# Patient Record
Sex: Female | Born: 1945 | Race: White | Hispanic: No | Smoking: Former smoker
Health system: Southern US, Community
[De-identification: ages and names within clinical notes are randomized; demographics above are authoritative.]

## PROBLEM LIST (undated history)

## (undated) DIAGNOSIS — E785 Hyperlipidemia, unspecified: Secondary | ICD-10-CM

## (undated) DIAGNOSIS — J45909 Unspecified asthma, uncomplicated: Secondary | ICD-10-CM

## (undated) DIAGNOSIS — F329 Major depressive disorder, single episode, unspecified: Secondary | ICD-10-CM

## (undated) DIAGNOSIS — I639 Cerebral infarction, unspecified: Secondary | ICD-10-CM

## (undated) DIAGNOSIS — I10 Essential (primary) hypertension: Secondary | ICD-10-CM

## (undated) DIAGNOSIS — K219 Gastro-esophageal reflux disease without esophagitis: Secondary | ICD-10-CM

## (undated) DIAGNOSIS — I509 Heart failure, unspecified: Secondary | ICD-10-CM

## (undated) DIAGNOSIS — F32A Depression, unspecified: Secondary | ICD-10-CM

## (undated) DIAGNOSIS — N189 Chronic kidney disease, unspecified: Secondary | ICD-10-CM

## (undated) DIAGNOSIS — E119 Type 2 diabetes mellitus without complications: Secondary | ICD-10-CM

## (undated) HISTORY — PX: TONSILLECTOMY: SUR1361

## (undated) HISTORY — DX: Major depressive disorder, single episode, unspecified: F32.9

## (undated) HISTORY — DX: Chronic kidney disease, unspecified: N18.9

## (undated) HISTORY — DX: Hyperlipidemia, unspecified: E78.5

## (undated) HISTORY — PX: TUBAL LIGATION: SHX77

## (undated) HISTORY — DX: Essential (primary) hypertension: I10

## (undated) HISTORY — DX: Unspecified asthma, uncomplicated: J45.909

## (undated) HISTORY — DX: Type 2 diabetes mellitus without complications: E11.9

## (undated) HISTORY — DX: Gastro-esophageal reflux disease without esophagitis: K21.9

## (undated) HISTORY — DX: Depression, unspecified: F32.A

## (undated) HISTORY — DX: Heart failure, unspecified: I50.9

## (undated) HISTORY — DX: Cerebral infarction, unspecified: I63.9

## (undated) MED FILL — Ferumoxytol Inj 510 MG/17ML (30 MG/ML) (Elemental Fe): INTRAVENOUS | Qty: 17 | Status: AC

---

## 1999-12-22 ENCOUNTER — Other Ambulatory Visit: Admission: RE | Admit: 1999-12-22 | Discharge: 1999-12-22 | Payer: Self-pay | Admitting: Family Medicine

## 2000-01-13 ENCOUNTER — Encounter (INDEPENDENT_AMBULATORY_CARE_PROVIDER_SITE_OTHER): Payer: Self-pay | Admitting: Specialist

## 2000-01-13 ENCOUNTER — Other Ambulatory Visit: Admission: RE | Admit: 2000-01-13 | Discharge: 2000-01-13 | Payer: Self-pay | Admitting: Gastroenterology

## 2000-04-05 HISTORY — PX: CHOLECYSTECTOMY: SHX55

## 2001-11-28 ENCOUNTER — Other Ambulatory Visit: Admission: RE | Admit: 2001-11-28 | Discharge: 2001-11-28 | Payer: Self-pay | Admitting: Family Medicine

## 2001-12-14 ENCOUNTER — Encounter: Admission: RE | Admit: 2001-12-14 | Discharge: 2001-12-14 | Payer: Self-pay | Admitting: Family Medicine

## 2001-12-14 ENCOUNTER — Encounter: Payer: Self-pay | Admitting: Family Medicine

## 2004-02-10 ENCOUNTER — Ambulatory Visit: Payer: Self-pay | Admitting: Family Medicine

## 2004-02-10 ENCOUNTER — Other Ambulatory Visit: Admission: RE | Admit: 2004-02-10 | Discharge: 2004-02-10 | Payer: Self-pay | Admitting: Family Medicine

## 2004-03-09 ENCOUNTER — Ambulatory Visit: Payer: Self-pay | Admitting: Oncology

## 2004-04-01 ENCOUNTER — Encounter: Admission: RE | Admit: 2004-04-01 | Discharge: 2004-04-01 | Payer: Self-pay | Admitting: Family Medicine

## 2004-06-29 ENCOUNTER — Ambulatory Visit: Payer: Self-pay | Admitting: Oncology

## 2004-10-19 ENCOUNTER — Ambulatory Visit: Payer: Self-pay | Admitting: Oncology

## 2004-11-12 ENCOUNTER — Ambulatory Visit: Payer: Self-pay | Admitting: Family Medicine

## 2005-03-17 ENCOUNTER — Ambulatory Visit: Payer: Self-pay | Admitting: Family Medicine

## 2005-03-17 ENCOUNTER — Other Ambulatory Visit: Admission: RE | Admit: 2005-03-17 | Discharge: 2005-03-17 | Payer: Self-pay | Admitting: Family Medicine

## 2005-03-17 ENCOUNTER — Encounter: Payer: Self-pay | Admitting: Family Medicine

## 2005-04-14 ENCOUNTER — Ambulatory Visit: Payer: Self-pay | Admitting: Oncology

## 2006-02-02 ENCOUNTER — Ambulatory Visit: Payer: Self-pay | Admitting: Oncology

## 2006-07-20 ENCOUNTER — Ambulatory Visit: Payer: Self-pay | Admitting: Oncology

## 2006-08-26 ENCOUNTER — Encounter: Payer: Self-pay | Admitting: Family Medicine

## 2006-11-08 ENCOUNTER — Ambulatory Visit: Payer: Self-pay | Admitting: Family Medicine

## 2006-11-08 DIAGNOSIS — F320 Major depressive disorder, single episode, mild: Secondary | ICD-10-CM | POA: Insufficient documentation

## 2006-11-08 DIAGNOSIS — F419 Anxiety disorder, unspecified: Secondary | ICD-10-CM | POA: Insufficient documentation

## 2006-11-08 DIAGNOSIS — E785 Hyperlipidemia, unspecified: Secondary | ICD-10-CM | POA: Insufficient documentation

## 2006-11-08 DIAGNOSIS — D696 Thrombocytopenia, unspecified: Secondary | ICD-10-CM | POA: Insufficient documentation

## 2006-11-17 LAB — CONVERTED CEMR LAB
ALT: 33 units/L (ref 0–35)
AST: 32 units/L (ref 0–37)
Albumin: 4.2 g/dL (ref 3.5–5.2)
Alkaline Phosphatase: 83 units/L (ref 39–117)
BUN: 16 mg/dL (ref 6–23)
Basophils Absolute: 0.1 10*3/uL (ref 0.0–0.1)
Basophils Relative: 0.7 % (ref 0.0–1.0)
Bilirubin, Direct: 0.1 mg/dL (ref 0.0–0.3)
CO2: 25 meq/L (ref 19–32)
Calcium: 9.8 mg/dL (ref 8.4–10.5)
Chloride: 107 meq/L (ref 96–112)
Cholesterol: 267 mg/dL (ref 0–200)
Creatinine, Ser: 1.1 mg/dL (ref 0.4–1.2)
Direct LDL: 171.1 mg/dL
Eosinophils Absolute: 0.1 10*3/uL (ref 0.0–0.6)
Eosinophils Relative: 2 % (ref 0.0–5.0)
GFR calc Af Amer: 65 mL/min
GFR calc non Af Amer: 54 mL/min
Glucose, Bld: 108 mg/dL — ABNORMAL HIGH (ref 70–99)
HCT: 41 % (ref 36.0–46.0)
HDL: 43.8 mg/dL (ref >39.0)
Hemoglobin: 14 g/dL (ref 12.0–15.0)
Lymphocytes Relative: 23.9 % (ref 12.0–46.0)
MCHC: 34.2 g/dL (ref 30.0–36.0)
MCV: 85.7 fL (ref 78.0–100.0)
Monocytes Absolute: 0.4 10*3/uL (ref 0.2–0.7)
Monocytes Relative: 6 % (ref 3.0–11.0)
Neutro Abs: 5 10*3/uL (ref 1.4–7.7)
Neutrophils Relative %: 67.4 % (ref 43.0–77.0)
Platelets: 576 10*3/uL — ABNORMAL HIGH (ref 150–400)
Potassium: 4.3 meq/L (ref 3.5–5.1)
RBC: 4.78 M/uL (ref 3.87–5.11)
RDW: 13 % (ref 11.5–14.6)
Sodium: 140 meq/L (ref 135–145)
TSH: 1.28 microintl units/mL (ref 0.35–5.50)
Total Bilirubin: 0.7 mg/dL (ref 0.3–1.2)
Total CHOL/HDL Ratio: 6.1
Total Protein: 7.5 g/dL (ref 6.0–8.3)
Triglycerides: 197 mg/dL — ABNORMAL HIGH (ref 0–149)
VLDL: 39 mg/dL (ref 0–40)
WBC: 7.4 10*3/uL (ref 4.5–10.5)

## 2006-12-06 ENCOUNTER — Ambulatory Visit: Payer: Self-pay | Admitting: Family Medicine

## 2007-08-08 ENCOUNTER — Telehealth (INDEPENDENT_AMBULATORY_CARE_PROVIDER_SITE_OTHER): Payer: Self-pay | Admitting: *Deleted

## 2007-10-16 ENCOUNTER — Other Ambulatory Visit: Admission: RE | Admit: 2007-10-16 | Discharge: 2007-10-16 | Payer: Self-pay | Admitting: Family Medicine

## 2007-10-16 ENCOUNTER — Ambulatory Visit: Payer: Self-pay | Admitting: Family Medicine

## 2007-10-16 ENCOUNTER — Encounter: Payer: Self-pay | Admitting: Family Medicine

## 2007-10-16 DIAGNOSIS — N951 Menopausal and female climacteric states: Secondary | ICD-10-CM | POA: Insufficient documentation

## 2007-10-17 ENCOUNTER — Encounter (INDEPENDENT_AMBULATORY_CARE_PROVIDER_SITE_OTHER): Payer: Self-pay | Admitting: *Deleted

## 2007-10-19 ENCOUNTER — Encounter (INDEPENDENT_AMBULATORY_CARE_PROVIDER_SITE_OTHER): Payer: Self-pay | Admitting: *Deleted

## 2007-10-27 ENCOUNTER — Telehealth (INDEPENDENT_AMBULATORY_CARE_PROVIDER_SITE_OTHER): Payer: Self-pay | Admitting: *Deleted

## 2007-10-27 ENCOUNTER — Encounter (INDEPENDENT_AMBULATORY_CARE_PROVIDER_SITE_OTHER): Payer: Self-pay | Admitting: *Deleted

## 2007-10-30 ENCOUNTER — Encounter (INDEPENDENT_AMBULATORY_CARE_PROVIDER_SITE_OTHER): Payer: Self-pay | Admitting: *Deleted

## 2007-11-01 ENCOUNTER — Ambulatory Visit: Payer: Self-pay | Admitting: Gastroenterology

## 2007-11-08 ENCOUNTER — Telehealth: Payer: Self-pay | Admitting: Gastroenterology

## 2007-11-15 ENCOUNTER — Ambulatory Visit: Payer: Self-pay | Admitting: Gastroenterology

## 2008-04-03 ENCOUNTER — Encounter: Payer: Self-pay | Admitting: Family Medicine

## 2008-04-08 ENCOUNTER — Telehealth: Payer: Self-pay | Admitting: Family Medicine

## 2008-04-08 DIAGNOSIS — R928 Other abnormal and inconclusive findings on diagnostic imaging of breast: Secondary | ICD-10-CM | POA: Insufficient documentation

## 2008-04-11 ENCOUNTER — Encounter: Payer: Self-pay | Admitting: Family Medicine

## 2008-12-02 ENCOUNTER — Ambulatory Visit: Payer: Self-pay | Admitting: Family Medicine

## 2008-12-02 DIAGNOSIS — I10 Essential (primary) hypertension: Secondary | ICD-10-CM | POA: Insufficient documentation

## 2008-12-09 LAB — CONVERTED CEMR LAB
ALT: 35 units/L (ref 0–35)
AST: 39 units/L — ABNORMAL HIGH (ref 0–37)
Albumin: 4.3 g/dL (ref 3.5–5.2)
Alkaline Phosphatase: 87 units/L (ref 39–117)
BUN: 14 mg/dL (ref 6–23)
Basophils Absolute: 0 10*3/uL (ref 0.0–0.1)
Basophils Relative: 0.2 % (ref 0.0–3.0)
Bilirubin, Direct: 0.1 mg/dL (ref 0.0–0.3)
CO2: 28 meq/L (ref 19–32)
Calcium: 9.5 mg/dL (ref 8.4–10.5)
Chloride: 104 meq/L (ref 96–112)
Cholesterol: 277 mg/dL — ABNORMAL HIGH (ref 0–200)
Creatinine, Ser: 1.2 mg/dL (ref 0.4–1.2)
Direct LDL: 168.4 mg/dL
Eosinophils Absolute: 0.1 10*3/uL (ref 0.0–0.7)
Eosinophils Relative: 1.3 % (ref 0.0–5.0)
GFR calc non Af Amer: 48.13 mL/min (ref >60)
Glucose, Bld: 98 mg/dL (ref 70–99)
HCT: 42.4 % (ref 36.0–46.0)
HDL: 43.5 mg/dL (ref >39.00)
Hemoglobin: 14.2 g/dL (ref 12.0–15.0)
Lymphocytes Relative: 23.5 % (ref 12.0–46.0)
Lymphs Abs: 1.7 10*3/uL (ref 0.7–4.0)
MCHC: 33.4 g/dL (ref 30.0–36.0)
MCV: 86.8 fL (ref 78.0–100.0)
Monocytes Absolute: 0.6 10*3/uL (ref 0.1–1.0)
Monocytes Relative: 9.1 % (ref 3.0–12.0)
Neutro Abs: 4.7 10*3/uL (ref 1.4–7.7)
Neutrophils Relative %: 65.9 % (ref 43.0–77.0)
Platelets: 497 10*3/uL — ABNORMAL HIGH (ref 150.0–400.0)
Potassium: 4.9 meq/L (ref 3.5–5.1)
RBC: 4.89 M/uL (ref 3.87–5.11)
RDW: 14.4 % (ref 11.5–14.6)
Sodium: 139 meq/L (ref 135–145)
TSH: 1.07 microintl units/mL (ref 0.35–5.50)
Total Bilirubin: 0.8 mg/dL (ref 0.3–1.2)
Total CHOL/HDL Ratio: 6
Total Protein: 7.5 g/dL (ref 6.0–8.3)
Triglycerides: 271 mg/dL — ABNORMAL HIGH (ref 0.0–149.0)
VLDL: 54.2 mg/dL — ABNORMAL HIGH (ref 0.0–40.0)
WBC: 7.1 10*3/uL (ref 4.5–10.5)

## 2008-12-12 ENCOUNTER — Telehealth (INDEPENDENT_AMBULATORY_CARE_PROVIDER_SITE_OTHER): Payer: Self-pay | Admitting: *Deleted

## 2008-12-12 ENCOUNTER — Encounter (INDEPENDENT_AMBULATORY_CARE_PROVIDER_SITE_OTHER): Payer: Self-pay | Admitting: *Deleted

## 2008-12-23 ENCOUNTER — Ambulatory Visit: Payer: Self-pay | Admitting: Family Medicine

## 2008-12-24 LAB — CONVERTED CEMR LAB
BUN: 26 mg/dL — ABNORMAL HIGH (ref 6–23)
CO2: 27 meq/L (ref 19–32)
Calcium: 9.2 mg/dL (ref 8.4–10.5)
Chloride: 104 meq/L (ref 96–112)
Creatinine, Ser: 1.3 mg/dL — ABNORMAL HIGH (ref 0.4–1.2)
GFR calc non Af Amer: 43.88 mL/min (ref >60)
Glucose, Bld: 110 mg/dL — ABNORMAL HIGH (ref 70–99)
Potassium: 4.7 meq/L (ref 3.5–5.1)
Sodium: 139 meq/L (ref 135–145)

## 2009-01-06 ENCOUNTER — Encounter: Payer: Self-pay | Admitting: Family Medicine

## 2009-01-06 ENCOUNTER — Ambulatory Visit: Payer: Self-pay | Admitting: Family Medicine

## 2009-01-06 ENCOUNTER — Other Ambulatory Visit: Admission: RE | Admit: 2009-01-06 | Discharge: 2009-01-06 | Payer: Self-pay | Admitting: Family Medicine

## 2009-01-06 DIAGNOSIS — G2581 Restless legs syndrome: Secondary | ICD-10-CM | POA: Insufficient documentation

## 2009-01-09 ENCOUNTER — Encounter: Payer: Self-pay | Admitting: Family Medicine

## 2009-03-03 ENCOUNTER — Ambulatory Visit: Payer: Self-pay | Admitting: Family Medicine

## 2009-03-04 ENCOUNTER — Telehealth: Payer: Self-pay | Admitting: Family Medicine

## 2009-03-04 LAB — CONVERTED CEMR LAB
ALT: 35 units/L (ref 0–35)
AST: 35 units/L (ref 0–37)
Albumin: 4.1 g/dL (ref 3.5–5.2)
Alkaline Phosphatase: 57 units/L (ref 39–117)
BUN: 15 mg/dL (ref 6–23)
Bilirubin, Direct: 0 mg/dL (ref 0.0–0.3)
CO2: 26 meq/L (ref 19–32)
Calcium: 9.3 mg/dL (ref 8.4–10.5)
Chloride: 106 meq/L (ref 96–112)
Cholesterol: 248 mg/dL — ABNORMAL HIGH (ref 0–200)
Creatinine, Ser: 1.2 mg/dL (ref 0.4–1.2)
Direct LDL: 145.6 mg/dL
GFR calc non Af Amer: 48.09 mL/min (ref >60)
Glucose, Bld: 98 mg/dL (ref 70–99)
HDL: 45.9 mg/dL (ref >39.00)
Hgb A1c MFr Bld: 6.2 % (ref 4.6–6.5)
Potassium: 4.3 meq/L (ref 3.5–5.1)
Sodium: 141 meq/L (ref 135–145)
Total Bilirubin: 0.7 mg/dL (ref 0.3–1.2)
Total CHOL/HDL Ratio: 5
Total Protein: 7.3 g/dL (ref 6.0–8.3)
Triglycerides: 281 mg/dL — ABNORMAL HIGH (ref 0.0–149.0)
VLDL: 56.2 mg/dL — ABNORMAL HIGH (ref 0.0–40.0)

## 2009-03-17 ENCOUNTER — Ambulatory Visit: Payer: Self-pay | Admitting: Family Medicine

## 2009-03-17 DIAGNOSIS — J019 Acute sinusitis, unspecified: Secondary | ICD-10-CM | POA: Insufficient documentation

## 2009-03-18 ENCOUNTER — Telehealth (INDEPENDENT_AMBULATORY_CARE_PROVIDER_SITE_OTHER): Payer: Self-pay | Admitting: *Deleted

## 2009-03-20 ENCOUNTER — Encounter: Payer: Self-pay | Admitting: Family Medicine

## 2009-06-06 ENCOUNTER — Encounter: Payer: Self-pay | Admitting: Family Medicine

## 2010-04-07 ENCOUNTER — Ambulatory Visit
Admission: RE | Admit: 2010-04-07 | Discharge: 2010-04-07 | Payer: Self-pay | Source: Home / Self Care | Attending: Family Medicine | Admitting: Family Medicine

## 2010-04-07 DIAGNOSIS — M25569 Pain in unspecified knee: Secondary | ICD-10-CM | POA: Insufficient documentation

## 2010-04-08 ENCOUNTER — Ambulatory Visit (HOSPITAL_BASED_OUTPATIENT_CLINIC_OR_DEPARTMENT_OTHER)
Admission: RE | Admit: 2010-04-08 | Discharge: 2010-04-08 | Payer: Self-pay | Source: Home / Self Care | Attending: Family Medicine | Admitting: Family Medicine

## 2010-04-23 ENCOUNTER — Encounter: Payer: Self-pay | Admitting: Family Medicine

## 2010-04-29 ENCOUNTER — Other Ambulatory Visit: Payer: Self-pay | Admitting: Family Medicine

## 2010-04-29 ENCOUNTER — Encounter: Payer: Self-pay | Admitting: Family Medicine

## 2010-04-29 ENCOUNTER — Other Ambulatory Visit (HOSPITAL_COMMUNITY)
Admission: RE | Admit: 2010-04-29 | Discharge: 2010-04-29 | Disposition: A | Discharge status: Home / Self Care | Payer: Medicare Other | Source: Ambulatory Visit | Attending: Family Medicine | Admitting: Family Medicine

## 2010-04-29 ENCOUNTER — Ambulatory Visit
Admission: RE | Admit: 2010-04-29 | Discharge: 2010-04-29 | Payer: Self-pay | Source: Home / Self Care | Attending: Family Medicine | Admitting: Family Medicine

## 2010-04-29 DIAGNOSIS — Z01419 Encounter for gynecological examination (general) (routine) without abnormal findings: Secondary | ICD-10-CM | POA: Insufficient documentation

## 2010-04-29 LAB — BASIC METABOLIC PANEL
BUN: 26 mg/dL — ABNORMAL HIGH (ref 6–23)
CO2: 25 mEq/L (ref 19–32)
Calcium: 9.3 mg/dL (ref 8.4–10.5)
Chloride: 106 mEq/L (ref 96–112)
Creatinine, Ser: 1.2 mg/dL (ref 0.4–1.2)
GFR: 50.33 mL/min — ABNORMAL LOW (ref >60.00)
Glucose, Bld: 107 mg/dL — ABNORMAL HIGH (ref 70–99)
Potassium: 4.8 mEq/L (ref 3.5–5.1)
Sodium: 139 mEq/L (ref 135–145)

## 2010-04-29 LAB — HEPATIC FUNCTION PANEL
ALT: 29 U/L (ref 0–35)
AST: 28 U/L (ref 0–37)
Albumin: 4 g/dL (ref 3.5–5.2)
Alkaline Phosphatase: 90 U/L (ref 39–117)
Bilirubin, Direct: 0.1 mg/dL (ref 0.0–0.3)
Total Bilirubin: 0.5 mg/dL (ref 0.3–1.2)
Total Protein: 7 g/dL (ref 6.0–8.3)

## 2010-04-29 LAB — LIPID PANEL
Cholesterol: 224 mg/dL — ABNORMAL HIGH (ref 0–200)
HDL: 41.4 mg/dL (ref >39.00)
Total CHOL/HDL Ratio: 5
Triglycerides: 254 mg/dL — ABNORMAL HIGH (ref 0.0–149.0)
VLDL: 50.8 mg/dL — ABNORMAL HIGH (ref 0.0–40.0)

## 2010-04-29 LAB — CBC WITH DIFFERENTIAL/PLATELET
Basophils Absolute: 0 10*3/uL (ref 0.0–0.1)
Basophils Relative: 0.6 % (ref 0.0–3.0)
Eosinophils Absolute: 0.1 10*3/uL (ref 0.0–0.7)
Eosinophils Relative: 2.2 % (ref 0.0–5.0)
HCT: 35.3 % — ABNORMAL LOW (ref 36.0–46.0)
Hemoglobin: 12 g/dL (ref 12.0–15.0)
Lymphocytes Relative: 22.4 % (ref 12.0–46.0)
Lymphs Abs: 1.3 10*3/uL (ref 0.7–4.0)
MCHC: 33.9 g/dL (ref 30.0–36.0)
MCV: 81.6 fl (ref 78.0–100.0)
Monocytes Absolute: 0.5 10*3/uL (ref 0.1–1.0)
Monocytes Relative: 8.8 % (ref 3.0–12.0)
Neutro Abs: 3.7 10*3/uL (ref 1.4–7.7)
Neutrophils Relative %: 66 % (ref 43.0–77.0)
Platelets: 313 10*3/uL (ref 150.0–400.0)
RBC: 4.32 Mil/uL (ref 3.87–5.11)
RDW: 19.2 % — ABNORMAL HIGH (ref 11.5–14.6)
WBC: 5.6 10*3/uL (ref 4.5–10.5)

## 2010-04-29 LAB — LDL CHOLESTEROL, DIRECT: Direct LDL: 142.2 mg/dL

## 2010-04-29 LAB — FECAL OCCULT BLOOD, GUAIAC: Fecal Occult Blood: NEGATIVE

## 2010-05-01 ENCOUNTER — Encounter (INDEPENDENT_AMBULATORY_CARE_PROVIDER_SITE_OTHER): Payer: Self-pay | Admitting: *Deleted

## 2010-05-01 LAB — CONVERTED CEMR LAB: Vit D, 25-Hydroxy: 18 ng/mL — ABNORMAL LOW (ref 30–89)

## 2010-05-03 LAB — CONVERTED CEMR LAB
ALT: 25 units/L (ref 0–35)
AST: 30 units/L (ref 0–37)
Albumin: 4.5 g/dL (ref 3.5–5.2)
Alkaline Phosphatase: 94 units/L (ref 39–117)
BUN: 24 mg/dL — ABNORMAL HIGH (ref 6–23)
Basophils Absolute: 0 10*3/uL (ref 0.0–0.1)
Basophils Relative: 0 % (ref 0.0–1.0)
Bilirubin Urine: NEGATIVE
Bilirubin, Direct: 0.1 mg/dL (ref 0.0–0.3)
Blood in Urine, dipstick: NEGATIVE
CO2: 25 meq/L (ref 19–32)
Calcium: 9.7 mg/dL (ref 8.4–10.5)
Chloride: 101 meq/L (ref 96–112)
Cholesterol: 254 mg/dL (ref 0–200)
Creatinine, Ser: 1.2 mg/dL (ref 0.4–1.2)
Direct LDL: 170.1 mg/dL
Eosinophils Absolute: 0.2 10*3/uL (ref 0.0–0.7)
Eosinophils Relative: 2.3 % (ref 0.0–5.0)
GFR calc Af Amer: 59 mL/min
GFR calc non Af Amer: 48 mL/min
Glucose, Bld: 83 mg/dL (ref 70–99)
Glucose, Urine, Semiquant: NEGATIVE
HCT: 42.8 % (ref 36.0–46.0)
HDL: 47 mg/dL (ref >39.0)
Hemoglobin: 14.7 g/dL (ref 12.0–15.0)
Ketones, urine, test strip: NEGATIVE
Lymphocytes Relative: 20.1 % (ref 12.0–46.0)
MCHC: 34.4 g/dL (ref 30.0–36.0)
MCV: 85.2 fL (ref 78.0–100.0)
Monocytes Absolute: 0.4 10*3/uL (ref 0.1–1.0)
Monocytes Relative: 4.7 % (ref 3.0–12.0)
Neutro Abs: 6.8 10*3/uL (ref 1.4–7.7)
Neutrophils Relative %: 72.9 % (ref 43.0–77.0)
Nitrite: NEGATIVE
Platelets: 548 10*3/uL — ABNORMAL HIGH (ref 150–400)
Potassium: 4.8 meq/L (ref 3.5–5.1)
Protein, U semiquant: NEGATIVE
RBC: 5.03 M/uL (ref 3.87–5.11)
RDW: 13.7 % (ref 11.5–14.6)
Sodium: 136 meq/L (ref 135–145)
Specific Gravity, Urine: 1.01
TSH: 0.92 microintl units/mL (ref 0.35–5.50)
Total Bilirubin: 0.9 mg/dL (ref 0.3–1.2)
Total CHOL/HDL Ratio: 5.4
Total Protein: 7.8 g/dL (ref 6.0–8.3)
Triglycerides: 177 mg/dL — ABNORMAL HIGH (ref 0–149)
Urobilinogen, UA: NEGATIVE
VLDL: 35 mg/dL (ref 0–40)
WBC Urine, dipstick: NEGATIVE
WBC: 9.2 10*3/uL (ref 4.5–10.5)
pH: 5

## 2010-05-04 ENCOUNTER — Encounter: Payer: Self-pay | Admitting: Family Medicine

## 2010-05-05 NOTE — Progress Notes (Signed)
  Phone Note Outgoing Call   Summary of Call: TG are elevated-----  Diet and exercise---  take fenofibrate  160  #30   2refills.  1 by mouth once daily .   recheck 3 months---272.4  lipid, hep  Follow-up for Phone Call        pt aware...Marland KitchenMarland KitchenFelecia Deloach CMA  December 12, 2008 4:59 PM     New/Updated Medications: FENOFIBRATE 160 MG TABS (FENOFIBRATE) 1 by mouth once daily Prescriptions: FENOFIBRATE 160 MG TABS (FENOFIBRATE) 1 by mouth once daily  #30 x 2   Entered by:   Jeremy Johann CMA   Authorized by:   Loreen Freud DO   Signed by:   Jeremy Johann CMA on 12/12/2008   Method used:   Faxed to ...       [facility] [address] (retail)       [address]       [place], [place]  [address]       Ph: [phone]       Fax: [phone]   RxID:   [id number]

## 2010-05-05 NOTE — Letter (Signed)
Summary: External Other/[facility]  External Other/[facility]   Imported By: Job Founds 10/31/2006 16:38:57  _____________________________________________________________________  External Attachment:    Type:   Image     Comment:   External Document

## 2010-05-05 NOTE — Letter (Signed)
Summary: Letter Regarding Restless Leg Syndrome & Iron Deficiency/Active   Letter Regarding Restless Leg Syndrome & Iron Deficiency/Active [facility]   Imported By: Lanelle Bal 06/19/2009 10:50:06  _____________________________________________________________________  External Attachment:    Type:   Image     Comment:   External Document

## 2010-05-05 NOTE — Letter (Signed)
Summary: Results Follow-up Letter  [place] [facility]  [address] [place], [place] [address]   Phone: [phone]  Fax: [phone]    10/19/2007       Erin [name] [address] [place], [place]  [address]  Dear [name],   The following are the results of your recent test(s):  Test     Result     Pap Smear    Normal_______  Not Normal_____       Comments: _________________________________________________________ Cholesterol LDL(Bad cholesterol):          Your goal is less than:         HDL (Good cholesterol):        Your goal is more than: _________________________________________________________ Other Tests:   _________________________________________________________  Please call for an appointment Or __Negative Pap Smear._______________________________________________________ _________________________________________________________ _________________________________________________________  Sincerely,  Ardyth Man [place] at [facility]

## 2010-05-05 NOTE — Letter (Signed)
Summary: Results Follow up Letter  [facility] [place]  [address] [place], [place] [address]   Phone: [phone]  Fax: [phone]    10/30/2007 MRN: [id number]  Erin [name] [address] [place], [place]  [address]  Dear [name],  The following are the results of your recent test(s):  Test         Result    Pap Smear:        Normal _____  Not Normal _____ Comments: ______________________________________________________ Cholesterol: LDL(Bad cholesterol):         Your goal is less than:         HDL (Good cholesterol):       Your goal is more than: Comments:  ______________________________________________________ Mammogram:        Normal _____  Not Normal _____ Comments:  ___________________________________________________________________ Hemoccult:        Normal _____  Not normal _______ Comments:    _____________________________________________________________________ Other Tests:  see lab results from 10/16/07  We routinely do not discuss normal results over the telephone.  If you desire a copy of the results, or you have any questions about this information we can discuss them at your next office visit.   Sincerely,

## 2010-05-05 NOTE — Letter (Signed)
Summary: Results Follow up Letter  [facility] [facility] [place]  [address] [place], [place] [address]   Phone: [phone]  Fax: [phone]    12/12/2008 MRN: [id number]  Erin [name] [address] [place], [place]  [address]      Dear [name],  The following are the results of your recent test(s):  Test         Result    Pap Smear:        Normal _____  Not Normal _____ Comments: ______________________________________________________ Cholesterol: LDL(Bad cholesterol):         Your goal is less than:         HDL (Good cholesterol):       Your goal is more than: Comments:  ______________________________________________________ Mammogram:        Normal _____  Not Normal _____ Comments:  ___________________________________________________________________ Hemoccult:        Normal _____  Not normal _______ Comments:    _____________________________________________________________________ Other Tests:Please see attached labs.     We routinely do not discuss normal results over the telephone.  If you desire a copy of the results, or you have any questions about this information we can discuss them at your next office visit.   Sincerely,   Jeremy Johann CMA

## 2010-05-05 NOTE — Letter (Signed)
Summary: Primary Care Consult Scheduled Letter  [facility] [place]  [address] [place], [place] [address]   Phone: [phone]  Fax: [phone]      10/17/2007 MRN: [id number]  Erin [name] [address] [place], [place]  [address]    Dear Erin [name],      We have scheduled an appointment for you.  At the recommendation of Dr.Lowne, we have scheduled you a colonoscopy with Dr. Arlyce Dice on August 12th at 9:00am check in at 8:00am. We also have scheduled you a nurse visit which is on July 29th at 11:00am. Their address is [facility] [address] [place]. The office phone number is [phone].  If this appointment day and time is not convenient for you, please feel free to call the office of the doctor you are being referred to at the number listed above and reschedule the appointment.     It is important for you to keep your scheduled appointments. We are here to make sure you are given good patient care. If you have questions or you have made changes to your appointment, please notify us at  [phone], ask for Tiffany.    Thank you,  Patient Care Coordinator [place] at [facility]

## 2010-05-05 NOTE — Assessment & Plan Note (Signed)
Summary: 1 month//tl   Vital Signs:  Patient Profile:   65 Years Old Female Weight:      215 pounds Pulse rate:   72 / minute BP sitting:   140 / 92  (left arm) Cuff size:   large  Vitals Entered By: Shonna Chock (December 06, 2006 10:44 AM)                 PCP:  Laury Axon  Chief Complaint:  1 MONTH F/U and Depressive symptoms.  History of Present Illness:  Depressive Symptoms      This is a 65 year old woman who presents with Depressive symptoms.  Pt here f/u anxiety/ depression.  Doing much better but feels like she needs just a little bit more.  The patient denies depressed mood, loss of interest/pleasure, significant weight loss, significant weight gain, insomnia, hypersomnia, psychomotor agitation, and psychomotor retardation.  The patient denies fatigue or loss of energy, feelings of worthlessness, diminished concentration, indecisiveness, thoughts of death, thoughts of suicide, suicidal intent, and suicidal plans.  The patient reports the following psychosocial stressors: major life changes.  The patient denies abnormally elevated mood, abnormally irritable mood, decreased need for sleep, increased talkativeness, distractibility, flight of ideas, increased goal-directed activity, and inflated self-esteem/ grandiosity.    Current Allergies: No known allergies       Physical Exam  General:     Well-developed,well-nourished,in no acute distress; alert,appropriate and cooperative throughout examination Psych:     Cognition and judgment appear intact. Alert and cooperative with normal attention span and concentration. No apparent delusions, illusions, hallucinations    Impression & Recommendations:  Problem # 1:  ANXIETY STATE NOS (ICD-300.00) f/u 6 months or sooner prn Her updated medication list for this problem includes:    Prozac 40 Mg Caps (Fluoxetine hcl) .Marland Kitchen... 1 by mouth once daily    Klonopin 0.5 Mg Tabs (Clonazepam) .Marland Kitchen... 1 by mouth two times a day as  needed Discussed medication use and relaxation techniques.   Complete Medication List: 1)  Fish Oil 1000 Mg Caps (Omega-3 fatty acids) .Marland Kitchen.. 1 by mouth qd 2)  Calcium 600  .Marland KitchenMarland Kitchen. 1 by mouth once daily 3)  Mvi  .Marland Kitchen.. 1 once daily 4)  Prozac 40 Mg Caps (Fluoxetine hcl) .Marland Kitchen.. 1 by mouth once daily 5)  Klonopin 0.5 Mg Tabs (Clonazepam) .Marland Kitchen.. 1 by mouth two times a day as needed     Prescriptions: PROZAC 40 MG  CAPS (FLUOXETINE HCL) 1 by mouth once daily  #30 x 5   Entered and Authorized by:   Loreen Freud DO   Signed by:   Loreen Freud DO on 12/06/2006   Method used:   Print then Give to Patient   RxID:   [id number]

## 2010-05-05 NOTE — Medication Information (Signed)
Summary: Prior Authorization & Approval for [facility]  Prior Authorization & Approval for [facility]   Imported By: Lanelle Bal 03/26/2009 09:21:44  _____________________________________________________________________  External Attachment:    Type:   Image     Comment:   External Document

## 2010-05-05 NOTE — Letter (Signed)
Summary: [facility]/CONSULT FOR ANTERIOR FLOOR OF MOUTH  [facility]/CONSULT FOR ANTERIOR FLOOR OF MOUTH   Imported By: Freddy Jaksch 11/14/2006 15:04:27  _____________________________________________________________________  External Attachment:    Type:   Image     Comment:   External Document

## 2010-05-05 NOTE — Miscellaneous (Signed)
Summary: Directives and Consents-RELEASE OF INFORMATION  Directives and Consents-RELEASE OF INFORMATION   Imported By: Vanessa [place] 02/22/2007 10:27:43  _____________________________________________________________________  External Attachment:    Type:   Image     Comment:   External Document

## 2010-05-05 NOTE — Letter (Signed)
Summary: Results Follow up Letter  [facility] [place]  [address] [place], [place] [address]   Phone: [phone]  Fax: [phone]    01/09/2009 MRN: [id number]  Erin [name] [address] [place], [place]  [address]  Dear Erin [name],  The following are the results of your recent test(s):  Test         Result    Pap Smear:        Normal _X____  Not Normal _____ Comments: ______________________________________________________ Cholesterol: LDL(Bad cholesterol):         Your goal is less than:         HDL (Good cholesterol):       Your goal is more than: Comments:  ______________________________________________________ Mammogram:        Normal _____  Not Normal _____ Comments:  ___________________________________________________________________ Hemoccult:        Normal _____  Not normal _______ Comments:    _____________________________________________________________________ Other Tests:    We routinely do not discuss normal results over the telephone.  If you desire a copy of the results, or you have any questions about this information we can discuss them at your next office visit.   Sincerely,   Army Fossa CMA  January 09, 2009 12:59 PM

## 2010-05-05 NOTE — Progress Notes (Signed)
Summary: left msg for pt to call in ref to labs       Additional Follow-up for Phone Call Additional follow up Details #2::    left msg for pt to call in ref to lab .Kandice Hams  October 27, 2007 4:52 PM  Follow-up by: Kandice Hams,  October 27, 2007 4:52 PM  Additional Follow-up for Phone Call Additional follow up Details #3:: Details for Additional Follow-up Action Taken: discussed with pt Additional Follow-up by: Shary Decamp,  October 30, 2007 4:48 PM  New/Updated Medications: ZOCOR 40 MG  TABS (SIMVASTATIN) 1 by mouth qhs   Prescriptions: ZOCOR 40 MG  TABS (SIMVASTATIN) 1 by mouth qhs  #30 x 2   Entered by:   Shary Decamp   Authorized by:   Nolon Rod. Paz MD   Signed by:   Shary Decamp on 10/30/2007   Method used:   Electronically sent to ...       [facility] [address]       [address]       [place], [place]  [address]       Ph: [phone]       Fax: [phone]   RxID:   [id number]

## 2010-05-05 NOTE — Miscellaneous (Signed)
Summary: GI Previsit  Clinical Lists Changes  Medications: Added new medication of DULCOLAX 5 MG  TBEC (BISACODYL) Day before procedure take 2 at 3pm and 2 at 8pm. - Signed Added new medication of METOCLOPRAMIDE HCL 10 MG  TABS (METOCLOPRAMIDE HCL) As per prep instructions. - Signed Added new medication of MIRALAX   POWD (POLYETHYLENE GLYCOL 3350) As per prep  instructions. - Signed Rx of DULCOLAX 5 MG  TBEC (BISACODYL) Day before procedure take 2 at 3pm and 2 at 8pm.;  #4 x 0;  Signed;  Entered by: Kyra Searles RN II;  Authorized by: Louis Meckel MD;  Method used: Electronic Rx of METOCLOPRAMIDE HCL 10 MG  TABS (METOCLOPRAMIDE HCL) As per prep instructions.;  #2 x 0;  Signed;  Entered by: Kyra Searles RN II;  Authorized by: Louis Meckel MD;  Method used: Electronic Rx of MIRALAX   POWD (POLYETHYLENE GLYCOL 3350) As per prep  instructions.;  #255gm x 0;  Signed;  Entered by: Kyra Searles RN II;  Authorized by: Louis Meckel MD;  Method used: Electronic Observations: Added new observation of NKA: T (11/01/2007 11:17)    Prescriptions: MIRALAX   POWD (POLYETHYLENE GLYCOL 3350) As per prep  instructions.  #255gm x 0   Entered by:   Kyra Searles RN II   Authorized by:   Louis Meckel MD   Signed by:   Kyra Searles RN II on 11/01/2007   Method used:   Electronically sent to ...       [facility]       [address]       [place], [place]  [address]       Ph: [phone]       Fax: [phone]   RxID:   [id number] METOCLOPRAMIDE HCL 10 MG  TABS (METOCLOPRAMIDE HCL) As per prep instructions.  #2 x 0   Entered by:   Kyra Searles RN II   Authorized by:   Louis Meckel MD   Signed by:   Kyra Searles RN II on 11/01/2007   Method used:   Electronically sent to ...       [facility]       [address]       [place], [place]  [address]       Ph: [phone]       Fax: [phone]   RxID:    [id number] DULCOLAX 5 MG  TBEC (BISACODYL) Day before procedure take 2 at 3pm and 2 at 8pm.  #4 x 0   Entered by:   Kyra Searles RN II   Authorized by:   Louis Meckel MD   Signed by:   Kyra Searles RN II on 11/01/2007   Method used:   Electronically sent to ...       [facility]       [address]       [place], [place]  [address]       Ph: [phone]       Fax: [phone]   RxID:   [id number]

## 2010-05-05 NOTE — Assessment & Plan Note (Signed)
Summary: COMPLETE PHYSICAL W/PAP,FASTING,NS FEE/RH....   Vital Signs:  Patient profile:   65 year old female Height:      65 inches Weight:      221.8 pounds Temp:     97.7 degrees F oral Pulse rate:   76 / minute Pulse rhythm:   regular BP sitting:   130 / 70  (left arm) Cuff size:   large  Vitals Entered By: Army Fossa CMA (January 06, 2009 8:58 AM) CC: CPX, pap. Discuss BP medications.    History of Present Illness: Erin [name] here for cpe , pap and to discuss labs and medication. Erin [name] with no complaints.    Preventive Screening-Counseling & Management  Alcohol-Tobacco     Alcohol drinks/day: 0     Smoking Status: never     Passive Smoke Exposure: no  Caffeine-Diet-Exercise     Caffeine use/day: 1     Does Patient Exercise: no     Exercise Counseling: to improve exercise regimen  Hep-HIV-STD-Contraception     Dental Visit-last 6 months no     Dental Care Counseling: to seek dental care; no dental care within six months     SBE monthly: no     SBE Education/Counseling: to perform regular SBE  Safety-Violence-Falls     Seat Belt Use: yes      Sexual History:  widow.    Current Medications (verified): 1)  Prozac 40 Mg Caps (Fluoxetine Hcl) .Marland Kitchen.. 1 By Mouth Once Daily 2)  Lisinopril-Hydrochlorothiazide 20-25 Mg Tabs (Lisinopril-Hydrochlorothiazide) .Marland Kitchen.. 1 By Mouth Once Daily 3)  Mirapex 0.125 Mg Tabs (Pramipexole Dihydrochloride) .Marland Kitchen.. 1 -2 By Mouth At Bedtime 4)  Fenofibrate 160 Mg Tabs (Fenofibrate) .Marland Kitchen.. 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 Family History of Asthma Family History of CAD Female 1st degree relative <60 Family History of Stroke F 1st degree relative <60 Family History Uterine cancer Leukemia stomach cancer  Social History: Reviewed history from 10/16/2007 and no changes required. 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 Eyes:  Denies blurring, discharge, double vision, eye irritation, eye pain, halos, itching, light sensitivity, red eye, vision loss-1 eye, and vision loss-both eyes; optho--q2y. ENT:  Denies decreased hearing, difficulty swallowing, ear discharge, earache, hoarseness, nasal congestion, nosebleeds, postnasal drainage, ringing in ears, sinus pressure, and sore throat. CV:  Denies bluish discoloration of lips or nails, chest pain or discomfort, difficulty breathing at night, difficulty breathing while lying down, fainting, fatigue, leg cramps with exertion, lightheadness, near fainting, palpitations,  shortness of breath with exertion, swelling of feet, swelling of hands, and weight gain. Resp:  Denies chest discomfort, chest pain with inspiration, cough, coughing up blood, excessive snoring, hypersomnolence,  morning headaches, pleuritic, shortness of breath, sputum productive, and wheezing. GI:  Denies abdominal pain, bloody stools, change in bowel habits, constipation, dark tarry stools, diarrhea, excessive appetite, gas, hemorrhoids, indigestion, loss of appetite, and nausea. GU:  Denies abnormal vaginal bleeding, decreased libido, discharge, dysuria, genital sores, hematuria, incontinence, nocturia, urinary frequency, and urinary hesitancy. MS:  Denies joint pain, joint redness, joint swelling, loss of strength, low back pain, mid back pain, muscle aches, muscle , cramps, muscle weakness, stiffness, and thoracic pain. Derm:  Denies changes in color of skin, changes in nail beds, dryness, excessive perspiration, flushing, hair loss, insect bite(s), itching, lesion(s), poor wound healing, and rash. Neuro:  Denies brief paralysis, difficulty with concentration, disturbances in coordination, falling down, headaches, inability to speak, memory loss, numbness, poor balance, seizures, sensation of room spinning, tingling, tremors, visual disturbances, and weakness. Psych:  Denies alternate hallucination ( auditory/visual), anxiety, depression, easily angered, easily tearful, irritability, mental problems, panic attacks, sense of great danger, suicidal thoughts/plans, thoughts of violence, unusual visions or sounds, and thoughts /plans of harming others. Endo:  Denies cold intolerance, excessive hunger, excessive thirst, excessive urination, heat intolerance, polyuria, and weight change. Heme:  Denies abnormal bruising, bleeding, enlarge lymph nodes, fevers, pallor, and skin discoloration.  Physical Exam  General:  Well-developed,well-nourished,in no acute distress; alert,appropriate and cooperative throughout examination Head:  Normocephalic and atraumatic without obvious abnormalities. No apparent alopecia or balding. Eyes:  vision grossly intact, pupils equal, pupils round, pupils reactive to light, and no  injection.   Ears:  External ear exam shows no significant lesions or deformities.  Otoscopic examination reveals clear canals, tympanic membranes are intact bilaterally without bulging, retraction, inflammation or discharge. Hearing is grossly normal bilaterally. Nose:  External nasal examination shows no deformity or inflammation. Nasal mucosa are pink and moist without lesions or exudates. Mouth:  Oral mucosa and oropharynx without lesions or exudates.  Teeth in good repair. Neck:  No deformities, masses, or tenderness noted.no carotid bruits.   Chest Wall:  No deformities, masses, or tenderness noted. Breasts:  No mass, nodules, thickening, tenderness, bulging, retraction, inflamation, nipple discharge or skin changes noted.   Lungs:  Normal respiratory effort, chest expands symmetrically. Lungs are clear to auscultation, no crackles or wheezes. Heart:  Normal rate and regular rhythm. S1 and S2 normal without gallop, murmur, click, rub or other extra sounds. Abdomen:  Bowel sounds positive,abdomen soft and non-tender without masses, organomegaly or hernias noted. Rectal:  No external abnormalities noted. Normal sphincter tone. No rectal masses or tenderness. Genitalia:  Pelvic Exam:        External: normal female genitalia without lesions or masses        Vagina: normal without lesions or masses        Cervix: normal without lesions or masses        Adnexa: normal bimanual exam without masses or fullness        Uterus: normal by palpation        Pap smear: performed Msk:  normal ROM, no joint tenderness, no joint swelling, no joint warmth, no redness over joints, no joint deformities, no joint instability, and no crepitation.   Pulses:  R and L carotid,radial,femoral,dorsalis pedis and posterior tibial pulses are full and equal bilaterally Extremities:  No clubbing, cyanosis, edema, or deformity noted with normal full  range of motion of all joints.   Neurologic:  No cranial nerve deficits  noted. Station and gait are normal. Plantar reflexes are down-going bilaterally. DTRs are symmetrical throughout. Sensory, motor and coordinative functions appear intact. Skin:  Intact without suspicious lesions or rashes Cervical Nodes:  No lymphadenopathy noted Axillary Nodes:  No palpable lymphadenopathy Psych:  Cognition and judgment appear intact. Alert and cooperative with normal attention span and concentration. No apparent delusions, illusions, hallucinations   Impression & Recommendations:  Problem # 1:  HYPERTENSION, ESSENTIAL NOS (ICD-401.9)  Her updated medication list for this problem includes:    Lisinopril-hydrochlorothiazide 20-25 Mg Tabs (Lisinopril-hydrochlorothiazide) .Marland Kitchen... 1 by mouth once daily  BP today: 130/70 Prior BP: 144/82 (12/23/2008)  Labs Reviewed: K+: 4.7 (12/23/2008) Creat: : 1.3 (12/23/2008)   Chol: 277 (12/02/2008)   HDL: 43.50 (12/02/2008)   LDL: DEL (10/16/2007)   TG: 271.0 (12/02/2008)  Orders: EKG w/ Interpretation (93000)  Problem # 2:  ROUTINE GYNECOLOGICAL EXAMINATION (ICD-V72.31)  pap done we need mammo and bmd results.  Orders: EKG w/ Interpretation (93000)  Problem # 3:  POSTMENOPAUSAL STATUS (ICD-627.2)  Problem # 4:  ANXIETY STATE NOS (ICD-300.00)  Her updated medication list for this problem includes:    Prozac 40 Mg Caps (Fluoxetine hcl) .Marland Kitchen... 1 by mouth once daily  Discussed medication use and relaxation techniques.   Problem # 5:  HYPERLIPIDEMIA (ICD-272.4)  Her updated medication list for this problem includes:    Fenofibrate 160 Mg Tabs (Fenofibrate) .Marland Kitchen... 1 by mouth once daily  Labs Reviewed: SGOT: 39 (12/02/2008)   SGPT: 35 (12/02/2008)   HDL:43.50 (12/02/2008), 47.0 (10/16/2007)  LDL:DEL (10/16/2007), DEL (11/08/2006)  Chol:277 (12/02/2008), 254 (10/16/2007)  Trig:271.0 (12/02/2008), 177 (10/16/2007)  Orders: EKG w/ Interpretation (93000)  Complete Medication List: 1)  Prozac 40 Mg Caps (Fluoxetine hcl) .Marland Kitchen.. 1  by mouth once daily 2)  Lisinopril-hydrochlorothiazide 20-25 Mg Tabs (Lisinopril-hydrochlorothiazide) .Marland Kitchen.. 1 by mouth once daily 3)  Mirapex 0.125 Mg Tabs (Pramipexole dihydrochloride) .Marland Kitchen.. 1 -2 by mouth at bedtime 4)  Fenofibrate 160 Mg Tabs (Fenofibrate) .Marland Kitchen.. 1 by mouth once daily  Other Orders: Admin 1st Vaccine (09811) Flu Vaccine 70yrs + (91478) Prescriptions: MIRAPEX 0.125 MG TABS (PRAMIPEXOLE DIHYDROCHLORIDE) 1 -2 by mouth at bedtime  #60 x 11   Entered and Authorized by:   Loreen Freud DO   Signed by:   Loreen Freud DO on 01/06/2009   Method used:   Electronically to        [facility] [address] (retail)       [phone]       [place], [place]  [address]       Ph: [phone]       Fax: [phone]   RxID:   [id number] PROZAC 40 MG CAPS (FLUOXETINE HCL) 1 by mouth once daily  #30 x 11   Entered and Authorized by:   Loreen Freud DO   Signed by:   Loreen Freud DO on 01/06/2009   Method used:   Electronically to        [facility] [address] (retail)       [id number]       [place], [place]  [address]       Ph: [phone]       Fax: [phone]   RxID:   [id number]  Flu Vaccine Consent Questions     Do you have  a history of severe allergic reactions to this vaccine? no    Any prior history of allergic reactions to egg and/or gelatin? no    Do you have a sensitivity to the preservative Thimersol? no    Do you have a past history of Guillan-Barre Syndrome? no    Do you currently have an acute febrile illness? no    Have you ever had a severe reaction to latex? no    Vaccine information given and explained to patient? yes    Are you currently pregnant? no    Lot Number:[id number]   Exp Date:10/02/2009   Site Given  Left Deltoid IM  Immunization History:  Tetanus/Td Immunization History:    Tetanus/Td:  given (03/17/2005)    Tetanus/Td:  td (03/17/2005) .lbflu    Prevention &  Chronic Care Immunizations   Influenza vaccine: Fluvax 3+  (01/06/2009)    Tetanus booster: 03/17/2005: given   Tetanus booster due: 03/18/2015    Pneumococcal vaccine: Not documented    H. zoster vaccine: 10/16/2007: Zostavax  Colorectal Screening   Hemoccult: Not documented    [facility]: [facility]    (11/15/2007)   Colonoscopy due: 11/2017  Other Screening   Pap smear: Not documented    Mammogram: Not documented    DXA bone density scan: Not documented   Smoking status: never  (01/06/2009)  Lipids   Total Cholesterol: 277  (12/02/2008)   LDL: DEL  (10/16/2007)   LDL Direct: 168.4  (12/02/2008)   HDL: 43.50  (12/02/2008)   Triglycerides: 271.0  (12/02/2008)    SGOT (AST): 39  (12/02/2008)   SGPT (ALT): 35  (12/02/2008)   Alkaline phosphatase: 87  (12/02/2008)   Total bilirubin: 0.8  (12/02/2008)  Hypertension   Last Blood Pressure: 130 / 70  (01/06/2009)   Serum creatinine: 1.3  (12/23/2008)   Serum potassium 4.7  (12/23/2008)  Self-Management Support :    Hypertension self-management support: Not documented    Lipid self-management support: Not documented     Immunization History:  Tetanus/Td Immunization History:    Tetanus/Td:  Td (03/17/2005)   TD Result Date:  03/17/2005 TD Result:  given TD Next Due:  10 yr

## 2010-05-05 NOTE — Progress Notes (Signed)
Summary: prior auth APPROVED [employer]  Phone Note Refill Request Message from:  Pharmacy on [address], [place], [place]  Refills Requested: Medication #1:  CRESTOR 5 MG TABS 1 by mouth every other day. prior Berkley Harvey [phone]  Initial call taken by: [facility] CMA,  March 18, 2009 9:56 AM  Follow-up for Phone Call        prior in process awaiting fax................Marland KitchenFelecia Deloach CMA  March 18, 2009 9:57 AM  prior auth faxed back awaiting response........Marland KitchenFelecia Deloach CMA  March 18, 2009 10:27 AM   Additional Follow-up for Phone Call Additional follow up Details #1::        PRIOR AUTH APPROVED 03-18-09 to 04-04-38 PHARMACY FAXED, APPROVAL LETTER SCAN TO CHART..............Marland KitchenFelecia Deloach CMA  March 21, 2009 8:37 AM

## 2010-05-05 NOTE — Assessment & Plan Note (Signed)
Summary: cpx   Vital Signs:  Patient Profile:   65 Years Old Female Height:     65 inches Weight:      208.25 pounds Temp:     97.9 degrees F oral Pulse rate:   72 / minute Resp:     16 per minute BP sitting:   140 / 90  (right arm)  Pt. in pain?   no  Vitals Entered By: Ardyth Man (October 16, 2007 1:33 PM)               Vision Screening: Left eye with correction: 20 / 20 Right eye with correction: 20 / 20 Both eyes with correction: 20 / 20        Vision Entered By: Ardyth Man (October 16, 2007 1:37 PM)   PCP:  Laury Axon  Chief Complaint:  CPX-Pap-Fasting Labs.  History of Present Illness: Pt here for CPE and pap and fasting labs.   Pt husband died of COPD in Aug 28, 2022. No complaints.    Current Allergies: No known allergies   Past Medical History:    Reviewed history from 11/08/2006 and no changes required:       Depression       Hyperlipidemia  Past Surgical History:    Reviewed history from 11/08/2006 and no changes required:       Cholecystectomy 2000-08-27       Tubal ligation       Tonsillectomy   Family History:    Reviewed history from 11/08/2006 and no changes required:       Family History of Asthma       Family History of CAD Female 1st degree relative <60       Family History of Stroke F 1st degree relative <60       Family History Uterine cancer       Leukemia       stomach cancer  Social History:    Reviewed history from 11/08/2006 and no changes required:       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[employer]       Never Smoked       Alcohol use-no       Drug use-no       Regular exercise-no       Widow/Widower   Risk Factors:  Passive smoke exposure:  no HIV high-risk behavior:  no Caffeine use:  2 drinks per day Seatbelt use:  100 %  Family History Risk Factors:    Family History of MI in females < 81 years old:  no    Family History of MI in males < 80 years old:  no  Colonoscopy History:     Date of Last Colonoscopy:   01/13/2000    Results:  colon polyps    Review of Systems      See HPI  General      Denies chills, fatigue, fever, loss of appetite, malaise, sleep disorder, sweats, weakness, and weight loss.      optho --due  Eyes      Denies blurring, discharge, double vision, eye irritation, eye pain, halos, itching, light sensitivity, red eye, vision loss-1 eye, and vision loss-both eyes.      optho--due  ENT      Denies decreased hearing, difficulty swallowing, ear discharge, earache, hoarseness, nasal congestion, nosebleeds, postnasal drainage, ringing in ears, sinus pressure, and sore throat.      dentist-- due  CV      Denies bluish discoloration of lips or  nails, chest pain or discomfort, difficulty breathing at night, difficulty breathing while lying down, fainting, fatigue, leg cramps with exertion, lightheadness, near fainting, palpitations, shortness of breath with exertion, swelling of feet, swelling of hands, and weight gain.  Resp      Denies chest discomfort, chest pain with inspiration, cough, coughing up blood, excessive snoring, hypersomnolence, morning headaches, pleuritic, shortness of breath, sputum productive, and wheezing.  GI      Denies abdominal pain, bloody stools, change in bowel habits, constipation, dark tarry stools, diarrhea, excessive appetite, gas, hemorrhoids, indigestion, loss of appetite, nausea, vomiting, vomiting blood, and yellowish skin color.  GU      Denies abnormal vaginal bleeding, decreased libido, discharge, dysuria, genital sores, hematuria, incontinence, nocturia, urinary frequency, and urinary hesitancy.  MS      Denies joint pain, joint redness, joint swelling, loss of strength, low back pain, mid back pain, muscle aches, muscle , cramps, muscle weakness, stiffness, and thoracic pain.  Derm      Denies changes in color of skin, changes in nail beds, dryness, excessive perspiration, flushing, hair loss, insect bite(s), itching, lesion(s), poor  wound healing, and rash.  Neuro      Denies brief paralysis, difficulty with concentration, disturbances in coordination, falling down, headaches, inability to speak, memory loss, numbness, poor balance, seizures, sensation of room spinning, tingling, tremors, visual disturbances, and weakness.  Psych      Denies alternate hallucination ( auditory/visual), anxiety, depression, easily angered, easily tearful, irritability, mental problems, panic attacks, sense of great danger, suicidal thoughts/plans, thoughts of violence, unusual visions or sounds, and thoughts /plans of harming others.  Endo      Denies cold intolerance, excessive hunger, excessive thirst, excessive urination, heat intolerance, polyuria, and weight change.  Heme      Denies abnormal bruising, bleeding, enlarge lymph nodes, fevers, pallor, and skin discoloration.  Allergy      Denies hives or rash, itching eyes, persistent infections, seasonal allergies, and sneezing.   Physical Exam  General:     Well-developed,well-nourished,in no acute distress; alert,appropriate and cooperative throughout examination Head:     Normocephalic and atraumatic without obvious abnormalities. No apparent alopecia or balding. Eyes:     vision grossly intact, pupils equal, pupils round, pupils reactive to light, and no injection.   Ears:     External ear exam shows no significant lesions or deformities.  Otoscopic examination reveals clear canals, tympanic membranes are intact bilaterally without bulging, retraction, inflammation or discharge. Hearing is grossly normal bilaterally. Nose:     External nasal examination shows no deformity or inflammation. Nasal mucosa are pink and moist without lesions or exudates. Mouth:     Oral mucosa and oropharynx without lesions or exudates.  Teeth in good repair. Neck:     No deformities, masses, or tenderness noted. Chest Wall:     No deformities, masses, or tenderness noted. Breasts:     No  mass, nodules, thickening, tenderness, bulging, retraction, inflamation, nipple discharge or skin changes noted.   Lungs:     Normal respiratory effort, chest expands symmetrically. Lungs are clear to auscultation, no crackles or wheezes. Heart:     normal rate, regular rhythm, and no murmur.   Abdomen:     Bowel sounds positive,abdomen soft and non-tender without masses, organomegaly or hernias noted. Rectal:     No external abnormalities noted. Normal sphincter tone. No rectal masses or tenderness. Genitalia:     Pelvic Exam:        External: normal  female genitalia without lesions or masses        Vagina: normal without lesions or masses        Cervix: normal without lesions or masses        Adnexa: normal bimanual exam without masses or fullness        Uterus: normal by palpation        Pap smear: performed Msk:     normal ROM, no joint tenderness, no joint swelling, no joint warmth, no redness over joints, no joint deformities, no joint instability, and no crepitation.   Pulses:     R posterior tibial normal, R dorsalis pedis normal, R carotid normal, L posterior tibial normal, L dorsalis pedis normal, and L carotid normal.   Extremities:     No clubbing, cyanosis, edema, or deformity noted with normal full range of motion of all joints.   Neurologic:     No cranial nerve deficits noted. Station and gait are normal. Plantar reflexes are down-going bilaterally. DTRs are symmetrical throughout. Sensory, motor and coordinative functions appear intact. Skin:     Intact without suspicious lesions or rashes Cervical Nodes:     No lymphadenopathy noted Axillary Nodes:     No palpable lymphadenopathy Psych:     Cognition and judgment appear intact. Alert and cooperative with normal attention span and concentration. No apparent delusions, illusions, hallucinations    Impression & Recommendations:  Problem # 1:  PREVENTIVE HEALTH CARE (ICD-V70.0) [facility] UTD Orders: Gastroenterology  Referral (GI)   Problem # 2:  POSTMENOPAUSAL STATUS (ICD-627.2)  Orders: EKG w/ Interpretation (93000) Venipuncture (96295) TLB-BMP (Basic Metabolic Panel-BMET) (80048-METABOL) TLB-CBC Platelet - w/Differential (85025-CBCD) TLB-Hepatic/Liver Function Pnl (80076-HEPATIC) TLB-TSH (Thyroid Stimulating Hormone) (84443-TSH) TLB-Lipid Panel (80061-LIPID)   Problem # 3:  ANXIETY STATE NOS (ICD-300.00)  The following medications were removed from the medication list:    Klonopin 0.5 Mg Tabs (Clonazepam) .Marland Kitchen... 1 by mouth two times a day as needed  Her updated medication list for this problem includes:    Prozac 40 Mg Caps (Fluoxetine hcl) .Marland Kitchen... 1 by mouth once daily  Orders: EKG w/ Interpretation (93000) Venipuncture (28413) TLB-BMP (Basic Metabolic Panel-BMET) (80048-METABOL) TLB-CBC Platelet - w/Differential (85025-CBCD) TLB-Hepatic/Liver Function Pnl (80076-HEPATIC) TLB-TSH (Thyroid Stimulating Hormone) (84443-TSH) TLB-Lipid Panel (80061-LIPID) Discussed medication use and relaxation techniques.   Problem # 4:  HYPERLIPIDEMIA (ICD-272.4)  Orders: EKG w/ Interpretation (93000) Venipuncture (24401) TLB-BMP (Basic Metabolic Panel-BMET) (80048-METABOL) TLB-CBC Platelet - w/Differential (85025-CBCD) TLB-Hepatic/Liver Function Pnl (80076-HEPATIC) TLB-TSH (Thyroid Stimulating Hormone) (84443-TSH) TLB-Lipid Panel (80061-LIPID)  Labs Reviewed: Chol: 267 (11/08/2006)   HDL: 43.8 (11/08/2006)   LDL: DEL (11/08/2006)   TG: 197 (11/08/2006) SGOT: 32 (11/08/2006)   SGPT: 33 (11/08/2006)   Problem # 5:  Gynecological examination-routine (ICD-V72.31)  Complete Medication List: 1)  Fish Oil 1000 Mg Caps (Omega-3 fatty acids) .Marland Kitchen.. 1 by mouth qd 2)  Calcium 600  .Marland KitchenMarland Kitchen. 1 by mouth once daily 3)  Mvi  .Marland Kitchen.. 1 once daily 4)  Prozac 40 Mg Caps (Fluoxetine hcl) .Marland Kitchen.. 1 by mouth once daily 5)  Screening Mammogram  .... V70.0 6)  Bone Density  .... Dx--- postmenopausal  Other Orders: Zoster  (Shingles) Vaccine Live [id number]) Admin 1st Vaccine (36644)    Prescriptions: PROZAC 40 MG  CAPS (FLUOXETINE HCL) 1 by mouth once daily  #30 x 11   Entered and Authorized by:   Loreen Freud DO   Signed by:   Loreen Freud DO on 10/16/2007   Method used:   Electronically  sent to ...       [facility]       [address]       [place], [place]  [address]       Ph: [phone]       Fax: [phone]   RxID:   [id number] BONE DENSITY DX--- postmenopausal  #1 x 0   Entered and Authorized by:   Loreen Freud DO   Signed by:   Loreen Freud DO on 10/16/2007   Method used:   Print then Give to Patient   RxID:   [id number] SCREENING MAMMOGRAM V70.0  #1 x 0   Entered and Authorized by:   Loreen Freud DO   Signed by:   Loreen Freud DO on 10/16/2007   Method used:   Print then Give to Patient   RxID:   [id number]  ]  EKG  Procedure date:  10/16/2007  Findings:      sinus rhythm  73 bpm   EKG  Procedure date:  10/16/2007  Findings:      sinus rhythm  73 bpm    Zostavax # 1    Vaccine Type: Zostavax    Site: right deltoid    Mfr: Merck    Dose: 0.5 ml    Route: IM    Given by: Ardyth Man    Exp. Date: 06/27/2008    Lot #: [id number]    VIS given: 01/15/05 given October 16, 2007.   Laboratory Results   Urine Tests   Date/Time Reported: October 16, 2007 3:25 PM   Routine Urinalysis   Color: yellow Appearance: Clear Glucose: negative   (Normal Range: Negative) Bilirubin: negative   (Normal Range: Negative) Ketone: negative   (Normal Range: Negative) Spec. Gravity: 1.010   (Normal Range: 1.003-1.035) Blood: negative   (Normal Range: Negative) pH: 5.0   (Normal Range: 5.0-8.0) Protein: negative   (Normal Range: Negative) Urobilinogen: negative   (Normal Range: 0-1) Nitrite: negative   (Normal Range: Negative) Leukocyte Esterace: negative   (Normal Range: Negative)    CommentsFloydene Flock CMA  October 16, 2007 3:25  PM

## 2010-05-05 NOTE — Assessment & Plan Note (Signed)
Summary: RENEW PROZAC & DISCUSS BLOOD PRESSURE,NS FEE/RH.....   Vital Signs:  Patient profile:   65 year old female Height:      65 inches Weight:      220 pounds BMI:     36.74 Temp:     98.5 degrees F oral Pulse rate:   66 / minute BP sitting:   150 / 100  (left arm)  Vitals Entered By: Jeremy Johann CMA (December 02, 2008 3:16 PM) CC: discuss bp, refills   History of Present Illness: Pt here c/o high bp --no cp, sob, or cp.  Pt also needs refills prozac.  She has been very depressed lately but is also not sleepin well at night because of restless legs.      Current Medications (verified): 1)  Prozac 40 Mg Caps (Fluoxetine Hcl) .Marland Kitchen.. 1 By Mouth Once Daily 2)  Zestoretic 10-12.5 Mg Tabs (Lisinopril-Hydrochlorothiazide) .Marland Kitchen.. 1 By Mouth Once Daily 3)  Mirapex 0.125 Mg Tabs (Pramipexole Dihydrochloride) .Marland Kitchen.. 1 By Mouth At Bedtime For 1 Week . If Needed Increase To 2 By Mouth At Bedtime For 1 Week and May Increase To 3 By Mouth At Bedtime As Needed  Allergies (verified): No Known Drug Allergies  Past History:  Past medical, surgical, family and social histories (including risk factors) reviewed, and no changes noted (except as noted below).  Past Medical History: Reviewed history from 11/08/2006 and no changes required. Depression Hyperlipidemia  Past Surgical History: Reviewed history from 11/08/2006 and no changes required. Cholecystectomy 2002 Tubal ligation Tonsillectomy  Family History: Reviewed history from 10/16/2007 and no changes required. Family History of Asthma Family History of CAD Female 1st degree relative <60 Family History of Stroke F 1st degree relative <60 Family History Uterine cancer Leukemia stomach cancer  Social History: Reviewed history from 10/16/2007 and no changes required. 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 Lungs are clear to auscultation, no crackles or wheezes. Heart:  normal rate and no murmur.   Extremities:  No clubbing, cyanosis, edema, or deformity noted with normal full range of motion of all joints.   Skin:  Intact without suspicious lesions or rashes Cervical Nodes:  No lymphadenopathy noted Psych:  Cognition and judgment appear intact. Alert and cooperative with normal attention span and concentration. No apparent delusions, illusions, hallucinations   Impression & Recommendations:  Problem # 1:  ANXIETY STATE NOS (ICD-300.00)  The following medications were removed from the medication list:    Prozac 40 Mg Caps (Fluoxetine hcl) .Marland Kitchen... 1 by mouth once daily Her updated medication list for this problem includes:    Prozac 40 Mg Caps (Fluoxetine hcl) .Marland Kitchen... 1 by mouth once daily  Orders: Venipuncture ([address]) TLB-Lipid Panel (80061-LIPID) TLB-BMP (Basic Metabolic Panel-BMET) (80048-METABOL) TLB-CBC Platelet - w/Differential (85025-CBCD) TLB-Hepatic/Liver Function Pnl (80076-HEPATIC) TLB-TSH (Thyroid Stimulating Hormone) (84443-TSH)  Discussed medication use and relaxation techniques.   Problem # 2:  HYPERLIPIDEMIA (ICD-272.4)  The following medications were removed from the medication list:    Zocor 40 Mg Tabs (Simvastatin) .Marland Kitchen... 1 by mouth qhs  Labs Reviewed: SGOT: 30 (10/16/2007)   SGPT: 25 (10/16/2007)   HDL:47.0 (10/16/2007), 43.8 (11/08/2006)  LDL:DEL (10/16/2007), DEL (11/08/2006)  Chol:254 (10/16/2007), 267 (11/08/2006)  Trig:177 (10/16/2007), 197 (11/08/2006)  Orders: Venipuncture ([address]) TLB-Lipid Panel (80061-LIPID)  TLB-BMP (Basic Metabolic Panel-BMET) (80048-METABOL) TLB-CBC Platelet - w/Differential (85025-CBCD) TLB-Hepatic/Liver Function Pnl (80076-HEPATIC)  TLB-TSH (Thyroid Stimulating Hormone) (84443-TSH)  Problem # 3:  HYPERTENSION, ESSENTIAL NOS (ICD-401.9)  Her updated medication list for this problem includes:    Zestoretic 10-12.5 Mg Tabs (Lisinopril-hydrochlorothiazide) .Marland Kitchen... 1 by mouth once daily  BP today: 150/100 Prior BP: 140/90 (10/16/2007)  Labs Reviewed: K+: 4.8 (10/16/2007) Creat: : 1.2 (10/16/2007)   Chol: 254 (10/16/2007)   HDL: 47.0 (10/16/2007)   LDL: DEL (10/16/2007)   TG: 177 (10/16/2007)  Complete Medication List: 1)  Prozac 40 Mg Caps (Fluoxetine hcl) .Marland Kitchen.. 1 by mouth once daily 2)  Zestoretic 10-12.5 Mg Tabs (Lisinopril-hydrochlorothiazide) .Marland Kitchen.. 1 by mouth once daily 3)  Mirapex 0.125 Mg Tabs (Pramipexole dihydrochloride) .Marland Kitchen.. 1 by mouth at bedtime for 1 week . if needed increase to 2 by mouth at bedtime for 1 week and may increase to 3 by mouth at bedtime as needed  Patient Instructions: 1)  rto 2-3 weeks Prescriptions: MIRAPEX 0.125 MG TABS (PRAMIPEXOLE DIHYDROCHLORIDE) 1 by mouth at bedtime for 1 week . If needed increase to 2 by mouth at bedtime for 1 week and may increase to 3 by mouth at bedtime as needed  #60 x 0   Entered and Authorized by:   Loreen Freud DO   Signed by:   Loreen Freud DO on 12/02/2008   Method used:   Print then Give to Patient   RxID:   [id number] ZESTORETIC 10-12.5 MG TABS (LISINOPRIL-HYDROCHLOROTHIAZIDE) 1 by mouth once daily  #30 x 2   Entered and Authorized by:   Loreen Freud DO   Signed by:   Loreen Freud DO on 12/02/2008   Method used:   Electronically to        [facility] (retail)       [address]       [place], [place]  [address]       Ph: [phone]       Fax: [phone]   RxID:   [id number] PROZAC 40 MG CAPS (FLUOXETINE HCL) 1 by mouth once daily  #30 x 2   Entered and Authorized by:   Loreen Freud DO   Signed by:   Loreen Freud DO on 12/02/2008   Method used:   Electronically to        [facility]  (retail)       [address]       [place], [place]  [address]       Ph: [phone]       Fax: [phone]   RxID:   [id number]

## 2010-05-05 NOTE — Letter (Signed)
Summary: Results Follow up Letter  [facility] [place]  [address]   [place], [place] [address]   Phone: [phone]  Fax: [phone]    12/12/2008 MRN: [id number]  Yelena Bucher [address] [place],   [address]      Dear Ms. Dolloff,  The following are the results of your recent test(s):  Test         Result    Pap Smear:        Normal _____  Not Normal _____ Comments: ______________________________________________________ Cholesterol: LDL(Bad cholesterol):         Your goal is less than:         HDL (Good cholesterol):       Your goal is more than: Comments:  ______________________________________________________ Mammogram:        Normal _____  Not Normal _____ Comments:  ___________________________________________________________________ Hemoccult:        Normal _____  Not normal _______ Comments:    _____________________________________________________________________ Other Tests:Please see attached labs.     We routinely do not discuss normal results over the telephone.  If you desire a copy of the results, or you have any questions about this information we can discuss them at your next office visit.   Sincerely,   Felecia Deloach CMA 

## 2010-05-05 NOTE — Assessment & Plan Note (Signed)
Summary: 3 wks ov//ph   Vital Signs:  Patient profile:   65 year old female Weight:      220.50 pounds Temp:     98.4 degrees F oral Pulse rate:   74 / minute Pulse rhythm:   regular BP sitting:   144 / 82  (left arm) Cuff size:   large  Vitals Entered By: Army Fossa CMA (December 23, 2008 10:06 AM) CC: 3 wk ROV. Feeling weak and having some dizziness.    History of Present Illness:  Hypertension follow-up      This is a 65 year old woman who presents for Hypertension follow-up.  The patient complains of lightheadedness, but denies urinary frequency, headaches, edema, impotence, rash, and fatigue.  The patient denies the following associated symptoms: chest pain, chest pressure, exercise intolerance, dyspnea, palpitations, syncope, leg edema, and pedal edema.  Compliance with medications (by patient report) has been near 100%.  The patient reports that dietary compliance has been good.  The patient reports exercising occasionally.  Adjunctive measures currently used by the patient include salt restriction.  Pt states meds were making her feel sick and lightheaded but if she takes them after she eats she feels fine.    Current Medications (verified): 1)  Prozac 40 Mg Caps (Fluoxetine Hcl) .Marland Kitchen.. 1 By Mouth Once Daily 2)  Lisinopril-Hydrochlorothiazide 20-25 Mg Tabs (Lisinopril-Hydrochlorothiazide) .Marland Kitchen.. 1 By Mouth Once Daily 3)  Mirapex 0.125 Mg Tabs (Pramipexole Dihydrochloride) .Marland Kitchen.. 1 By Mouth At Bedtime For 1 Week . If Needed Increase To 2 By Mouth At Bedtime For 1 Week and May Increase To 3 By Mouth At Bedtime As Needed 4)  Fenofibrate 160 Mg Tabs (Fenofibrate) .Marland Kitchen.. 1 By Mouth Once Daily  Allergies (verified): No Known Drug Allergies  Past History:  Past medical, surgical, family and social histories (including risk factors) reviewed for relevance to current acute and chronic problems.  Past Medical History: Reviewed history from 11/08/2006 and no changes  required. Depression Hyperlipidemia  Past Surgical History: Reviewed history from 11/08/2006 and no changes required. Cholecystectomy 2002 Tubal ligation Tonsillectomy  Family History: Reviewed history from 10/16/2007 and no changes required. Family History of Asthma Family History of CAD Female 1st degree relative <60 Family History of Stroke F 1st degree relative <60 Family History Uterine cancer Leukemia stomach cancer  Social History: Reviewed history from 10/16/2007 and no changes required. 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 Lungs are clear to auscultation, no crackles or wheezes. Heart:  normal rate and no murmur.   Abdomen:  Bowel sounds positive,abdomen soft and non-tender without masses, organomegaly or hernias noted. Extremities:  No clubbing, cyanosis, edema, or deformity noted with normal full range of motion of all joints.   Skin:  Intact without suspicious lesions or rashes Psych:  Oriented X3 and normally interactive.     Impression & Recommendations:  Problem # 1:  HYPERTENSION, ESSENTIAL NOS (ICD-401.9)  Her updated medication list for this problem includes:    Lisinopril-hydrochlorothiazide 20-25 Mg Tabs (Lisinopril-hydrochlorothiazide) .Marland Kitchen... 1 by mouth once daily  Orders: Venipuncture ([address]) TLB-BMP (Basic Metabolic Panel-BMET) (80048-METABOL)  BP today: 144/82 Prior BP: 150/100 (12/02/2008)  Labs Reviewed: K+: 4.9 (12/02/2008) Creat: : 1.2 (12/02/2008)   Chol: 277 (12/02/2008)   HDL: 43.50 (12/02/2008)  LDL: DEL (10/16/2007)   TG: 271.0 (12/02/2008)  Complete Medication List: 1)  Prozac 40 Mg Caps (Fluoxetine hcl) .Marland Kitchen.. 1 by  mouth once daily 2)  Lisinopril-hydrochlorothiazide 20-25 Mg Tabs (Lisinopril-hydrochlorothiazide) .Marland Kitchen.. 1 by mouth once daily 3)  Mirapex 0.125 Mg Tabs (Pramipexole dihydrochloride) .Marland Kitchen.. 1 by mouth at bedtime for 1 week . if needed increase to 2 by mouth at bedtime for 1 week and may increase to 3 by mouth at bedtime as needed 4)  Fenofibrate 160 Mg Tabs (Fenofibrate) .Marland Kitchen.. 1 by mouth once daily Prescriptions: LISINOPRIL-HYDROCHLOROTHIAZIDE 20-25 MG TABS (LISINOPRIL-HYDROCHLOROTHIAZIDE) 1 by mouth once daily  #30 x 2   Entered and Authorized by:   Loreen Freud DO   Signed by:   Loreen Freud DO on 12/23/2008   Method used:   Electronically to        [facility] [address] (retail)       [phone]       [place], [place]  [address]       Ph: [phone]       Fax: [phone]   RxID:   [id number]

## 2010-05-05 NOTE — Progress Notes (Signed)
Summary: mammogram order  Phone Note From Other Clinic   Caller: debb Summary of Call: debb at [facility]ology fax over a report call back number [phone] pt needs order for diagnosis mammogram of the right breast and possible a ultrasound Initial call taken by: Jeremy Johann CMA,  April 08, 2008 4:44 PM  Follow-up for Phone Call        diagnostic mammo R breast --ordered Follow-up by: Loreen Freud DO,  April 08, 2008 4:54 PM  Additional Follow-up for Phone Call Additional follow up Details #1::        MMG scheduled for 04/11/08 @ 840 -- needs to arrive 30 minutes early no answer @ home number patient not @ work number ....Marland KitchenMarland KitchenShary Decamp  April 09, 2008 11:20 AM no answer.Marland KitchenMarland KitchenMarland KitchenShary Decamp  April 09, 2008 2:31 PM    New Problems: MAMMOGRAM, ABNORMAL, RIGHT (ICD-793.80)   Additional Follow-up for Phone Call Additional follow up Details #2::    please send letter Follow-up by: Loreen Freud DO,  April 09, 2008 1:00 PM  Additional Follow-up for Phone Call Additional follow up Details #3:: Details for Additional Follow-up Action Taken: Faxed. Ardyth Man  April 10, 2008 11:37 AM  Additional Follow-up by: Ardyth Man,  April 10, 2008 11:37 AM  New Problems: MAMMOGRAM, ABNORMAL, RIGHT (ICD-793.80)   Appended Document: mammogram order patient aware of appt  Appended Document: mammogram order we need results from [facility] received pt also thinks she had BMD there---we need those results as well  Appended Document: mammogram order will be faxed over.

## 2010-05-05 NOTE — Assessment & Plan Note (Signed)
Summary: FU ON LABS/[facility]   Vital Signs:  Patient profile:   65 year old female Weight:      226 pounds Temp:     97.8 degrees F oral Pulse rate:   82 / minute Pulse rhythm:   regular BP sitting:   142 / 84  (left arm) Cuff size:   large  Vitals Entered By: Army Fossa CMA (March 17, 2009 10:46 AM) CC: follow up on labs- pt has not started crestor- waiting on prior auth. , URI symptoms   History of Present Illness:       This is a 65 year old woman who presents with URI symptoms.  The symptoms began 4 weeks ago.  The patient complains of nasal congestion, purulent nasal discharge, and productive cough, but denies sore throat.  The patient denies fever, low-grade fever (<100.5 degrees), fever of 100.5-103 degrees, fever of 103.1-104 degrees, fever to >104 degrees, stiff neck, dyspnea, wheezing, rash, vomiting, diarrhea, use of an antipyretic, and response to antipyretic.  The patient also reports headache.  The patient denies itchy watery eyes, itchy throat, sneezing, seasonal symptoms, response to antihistamine, muscle aches, and severe fatigue.  The patient denies the following risk factors for Strep sinusitis: unilateral facial pain, unilateral nasal discharge, poor response to decongestant, double sickening, tooth pain, Strep exposure, tender adenopathy, and absence of cough.  Pt used nyquil with little relief.   Hyperlipidemia follow-up      The patient also presents for Hyperlipidemia follow-up.  The patient denies muscle aches, GI upset, abdominal pain, flushing, itching, constipation, diarrhea, and fatigue.  The patient denies the following symptoms: chest pain/pressure, exercise intolerance, dypsnea, palpitations, syncope, and pedal edema.  Compliance with medications (by patient report) has been near 100%.  Dietary compliance has been poor.  The patient reports no exercise.    Hypertension follow-up      The patient also presents for Hypertension follow-up.  The patient  complains of headaches, but denies lightheadedness, urinary frequency, edema, impotence, rash, and fatigue.  The patient denies the following associated symptoms: chest pain, chest pressure, exercise intolerance, dyspnea, palpitations, syncope, leg edema, and pedal edema.  Compliance with medications (by patient report) has been near 100%.  The patient reports that dietary compliance has been poor.  The patient reports no exercise.  Adjunctive measures currently used by the patient include salt restriction.    Current Medications (verified): 1)  Prozac 40 Mg Caps (Fluoxetine Hcl) .Marland Kitchen.. 1 By Mouth Once Daily 2)  Lisinopril-Hydrochlorothiazide 20-25 Mg Tabs (Lisinopril-Hydrochlorothiazide) .Marland Kitchen.. 1 By Mouth Once Daily 3)  Mirapex 0.125 Mg Tabs (Pramipexole Dihydrochloride) .Marland Kitchen.. 1 -2 By Mouth At Bedtime 4)  Fenofibrate 160 Mg Tabs (Fenofibrate) .Marland Kitchen.. 1 By Mouth Once Daily 5)  Crestor 5 Mg Tabs (Rosuvastatin Calcium) .Marland Kitchen.. 1 By Mouth Every Other Day.  Allergies (verified): No Known Drug Allergies  Past History:  Past medical, surgical, family and social histories (including risk factors) reviewed for relevance to current acute and chronic problems.  Past Medical History: Reviewed history from 11/08/2006 and no changes required. Depression Hyperlipidemia  Past Surgical History: Reviewed history from 11/08/2006 and no changes required. Cholecystectomy 2002 Tubal ligation Tonsillectomy  Family History: Reviewed history from 10/16/2007 and no changes required. Family History of Asthma Family History of CAD Female 1st degree relative <60 Family History of Stroke F 1st degree relative <60 Family History Uterine cancer Leukemia stomach cancer  Social History: Reviewed history from 10/16/2007 and no changes required. 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  Otoscopic examination reveals clear canals, tympanic membranes are intact bilaterally without bulging, retraction, inflammation or discharge. Hearing is grossly normal bilaterally. Nose:  L frontal sinus tenderness, L maxillary sinus tenderness, R frontal sinus tenderness, and R maxillary sinus tenderness.   Mouth:  Oral mucosa and oropharynx without lesions or exudates.  Teeth in good repair. Neck:  No deformities, masses, or tenderness noted. Lungs:  Normal respiratory effort, chest expands symmetrically. Lungs are clear to auscultation, no crackles or wheezes. Heart:  normal rate and no murmur.   Extremities:  No clubbing, cyanosis, edema, or deformity noted with normal full range of motion of all joints.   Skin:  Intact without suspicious lesions or rashes Cervical Nodes:  No lymphadenopathy noted Psych:  Oriented X3 and normally interactive.     Impression & Recommendations:  Problem # 1:  SINUSITIS - ACUTE-NOS (ICD-461.9)  Her updated medication list for this problem includes:    Augmentin 875-125 Mg Tabs (Amoxicillin-pot clavulanate) .Marland Kitchen... 1 by mouth two times a day    Nasacort Aq 55 Mcg/act Aers (Triamcinolone acetonide(nasal)) .Marland Kitchen... 2 sprays each nostril once daily  Instructed on treatment. Call if symptoms persist or worsen.   Problem # 2:  HYPERTENSION, ESSENTIAL NOS (ICD-401.9)  Her updated medication list for this problem includes:    Lisinopril-hydrochlorothiazide 20-25 Mg Tabs (Lisinopril-hydrochlorothiazide) .Marland Kitchen... 1 by mouth once daily  BP today: 142/84 Prior BP: 130/70 (01/06/2009)  Labs Reviewed: K+: 4.3 (03/03/2009) Creat: : 1.2 (03/03/2009)   Chol: 248 (03/03/2009)   HDL: 45.90 (03/03/2009)   LDL: DEL (10/16/2007)   TG: 281.0 (03/03/2009)  Problem # 3:  HYPERLIPIDEMIA  (ICD-272.4)  Her updated medication list for this problem includes:    Fenofibrate 160 Mg Tabs (Fenofibrate) .Marland Kitchen... 1 by mouth once daily    Crestor 5 Mg Tabs (Rosuvastatin calcium) .Marland Kitchen... 1 by mouth every other day.  Labs Reviewed: SGOT: 35 (03/03/2009)   SGPT: 35 (03/03/2009)   HDL:45.90 (03/03/2009), 43.50 (12/02/2008)  LDL:DEL (10/16/2007), DEL (11/08/2006)  Chol:248 (03/03/2009), 277 (12/02/2008)  Trig:281.0 (03/03/2009), 271.0 (12/02/2008)  Problem # 4:  DEPRESSION (ICD-311)  Her updated medication list for this problem includes:    Prozac 40 Mg Caps (Fluoxetine hcl) .Marland Kitchen... 1 by mouth once daily  Complete Medication List: 1)  Prozac 40 Mg Caps (Fluoxetine hcl) .Marland Kitchen.. 1 by mouth once daily 2)  Lisinopril-hydrochlorothiazide 20-25 Mg Tabs (Lisinopril-hydrochlorothiazide) .Marland Kitchen.. 1 by mouth once daily 3)  Mirapex 0.125 Mg Tabs (Pramipexole dihydrochloride) .Marland Kitchen.. 1 -2 by mouth at bedtime 4)  Fenofibrate 160 Mg Tabs (Fenofibrate) .Marland Kitchen.. 1 by mouth once daily 5)  Crestor 5 Mg Tabs (Rosuvastatin calcium) .Marland Kitchen.. 1 by mouth every other day. 6)  Augmentin 875-125 Mg Tabs (Amoxicillin-pot clavulanate) .Marland Kitchen.. 1 by mouth two times a day 7)  Nasacort Aq 55 Mcg/act Aers (Triamcinolone acetonide(nasal)) .... 2 sprays each nostril once daily Prescriptions: AUGMENTIN 875-125 MG TABS (AMOXICILLIN-POT CLAVULANATE) 1 by mouth two times a day  #20 x 0   Entered and Authorized by:   Loreen Freud DO   Signed by:   Loreen Freud DO on 03/17/2009   Method used:   Electronically to        [facility] (retail)  [address]       [place], [place]  [address]       Ph: [phone]       Fax: [phone]   RxID:   [id number] FENOFIBRATE 160 MG TABS (FENOFIBRATE) 1 by mouth once daily  #30 x 2   Entered and Authorized by:   Loreen Freud DO   Signed by:   Loreen Freud DO on 03/17/2009   Method used:   Electronically to        [facility] (retail)       [address]       [place], [place]  [address]       Ph: [phone]       Fax: [phone]   RxID:   [id number] LISINOPRIL-HYDROCHLOROTHIAZIDE 20-25 MG TABS (LISINOPRIL-HYDROCHLOROTHIAZIDE) 1 by mouth once daily  #30 x 2   Entered and Authorized by:   Loreen Freud DO   Signed by:   Loreen Freud DO on 03/17/2009   Method used:   Electronically to        [facility] (retail)       [address]       [place], [place]  [address]       Ph: [phone]       Fax: [phone]   RxID:   [id number] PROZAC 40 MG CAPS (FLUOXETINE HCL) 1 by mouth once daily  #30 x 11   Entered and Authorized by:   Loreen Freud DO   Signed by:   Loreen Freud DO on 03/17/2009   Method used:   Electronically to        [facility] (retail)       [address]       [place], [place]  [address]       Ph: [phone]       Fax: [phone]   RxID:   [id number]

## 2010-05-05 NOTE — Progress Notes (Signed)
Summary: dr Lorrine Kin  Phone Note Call from Patient Call back at Work Phone [phone] Message from:  Patient  Refills Requested: Medication #1:  PROZAC 40 MG  CAPS 1 by mouth once daily  rec by phone request rx on prozac generic 40 mg call into [facility] [place] [phone]. Pt has appt on 10-16-07  Initial call taken by: fdeloach,sma Caller: Patient Summary of Call: Rx called into pharmacy and patient aware Ardyth Man  Aug 08, 2007 9:29 AM       Prescriptions: PROZAC 40 MG  CAPS (FLUOXETINE HCL) 1 by mouth once daily  #30 x 1   Entered by:   Ardyth Man   Authorized by:   Loreen Freud DO   Signed by:   Ardyth Man on 08/08/2007   Method used:   Telephoned to ...         RxID:   [id number]

## 2010-05-05 NOTE — Assessment & Plan Note (Signed)
Summary: Erin [name]//tl   Vital Signs:  Patient Profile:   65 Years Old Female Weight:      215.6 pounds Pulse rate:   72 / minute BP sitting:   128 / 90  (left arm) Cuff size:   large  Vitals Entered By: Shonna Chock (November 08, 2006 10:56 AM)               PCP:  Laury Axon  Chief Complaint:  ANXIETY: NO CHEST PAIN and PATIENT TRIED LORAZEPAM (DAUGHTERS MED) AND IT HELPED.  History of Present Illness: Pt here with c/o increased stress.  Pt had bx under tongue from dentist and sent to ENT-- shoemaker.  No cancer, but ? precancerous.  Need records from them.  Pt also c/o increased stress at home and work.  Pt tried her daughter's med-- klonopin- with relief.   No chest pain. Symptoms for last month.  Pt took zoloft until 12/07.  She gained weight on it and really doesn't want that again.    Current Allergies: No known allergies   Past Medical History:    Depression    Hyperlipidemia  Past Surgical History:    Cholecystectomy 2002    Tubal ligation    Tonsillectomy   Family History:    Family History of Asthma    Family History of CAD Female 1st degree relative <60    Family History of Stroke F 1st degree relative <60  Social History:    Location manager    Married    Never Smoked    Alcohol use-no    Drug use-no    Regular exercise-no   Risk Factors:  Tobacco use:  never Drug use:  no Alcohol use:  no Exercise:  no   Review of Systems      See HPI  CV      Denies bluish discoloration of lips or nails, chest pain or discomfort, difficulty breathing at night, difficulty breathing while lying down, fainting, fatigue, leg cramps with exertion, lightheadness, near fainting, palpitations, shortness of breath with exertion, swelling of feet, swelling of hands, and weight gain.  Resp      Denies chest discomfort, chest pain with inspiration, cough, coughing up blood, excessive snoring, hypersomnolence, morning headaches, pleuritic, shortness of breath, sputum  productive, and wheezing.  Psych      Complains of anxiety, depression, easily tearful, and panic attacks.      Denies alternate hallucination ( auditory/visual), easily angered, irritability, mental problems, sense of great danger, suicidal thoughts/plans, thoughts of violence, unusual visions or sounds, and thoughts /plans of harming others.   Physical Exam  General:     Well-developed,well-nourished,in no acute distress; alert,appropriate and cooperative throughout examination Neck:     No deformities, masses, or tenderness noted. Psych:     Oriented X3, memory intact for recent and remote, normally interactive, good eye contact, not anxious appearing, and tearful.      Impression & Recommendations:  Problem # 1:  ANXIETY STATE NOS (ICD-300.00)  Her updated medication list for this problem includes:    Prozac 20 Mg Caps (Fluoxetine hcl) .Marland Kitchen... 1 by mouth once daily    Klonopin 0.5 Mg Tabs (Clonazepam) .Marland Kitchen... 1 by mouth two times a day as needed Discussed medication use and relaxation techniques.  d/w pt counseling-- pt does not want itat this time Orders: Venipuncture (29562) TLB-Lipid Panel (80061-LIPID) TLB-BMP (Basic Metabolic Panel-BMET) (80048-METABOL) TLB-CBC Platelet - w/Differential (85025-CBCD) TLB-Hepatic/Liver Function Pnl (80076-HEPATIC) TLB-TSH (Thyroid Stimulating Hormone) (84443-TSH)   Problem #  2:  DEPRESSION (ICD-311)  Her updated medication list for this problem includes:    Prozac 20 Mg Caps (Fluoxetine hcl) .Marland Kitchen... 1 by mouth once daily    Klonopin 0.5 Mg Tabs (Clonazepam) .Marland Kitchen... 1 by mouth two times a day as needed Discussed treatment options, including trial of antidpressant medication. Will refer to behavioral health. Follow-up call in in 24-48 hours and recheck in 2 weeks, sooner as needed. Patient agrees to call if any worsening of symptoms or thoughts of doing harm arise. Verified that the patient has no suicidal ideation at this time.   Complete  Medication List: 1)  Fish Oil 1000 Mg Caps (Omega-3 fatty acids) .Marland Kitchen.. 1 by mouth qd 2)  Calcium 600  .Marland KitchenMarland Kitchen. 1 by mouth once daily 3)  Mvi  .Marland Kitchen.. 1 once daily 4)  Prozac 20 Mg Caps (Fluoxetine hcl) .Marland Kitchen.. 1 by mouth once daily 5)  Klonopin 0.5 Mg Tabs (Clonazepam) .Marland Kitchen.. 1 by mouth two times a day as needed     Prescriptions: KLONOPIN 0.5 MG  TABS (CLONAZEPAM) 1 by mouth two times a day as needed  #60 x 0   Entered and Authorized by:   Loreen Freud DO   Signed by:   Loreen Freud DO on 11/08/2006   Method used:   Print then Give to Patient   RxID:   [id number] PROZAC 20 MG  CAPS (FLUOXETINE HCL) 1 by mouth once daily  #30 x 2   Entered and Authorized by:   Loreen Freud DO   Signed by:   Loreen Freud DO on 11/08/2006   Method used:   Print then Give to Patient   RxID:   [id number]

## 2010-05-05 NOTE — Progress Notes (Signed)
Summary: Lab results/meds   Phone Note Outgoing Call   Call placed by: Army Fossa CMA,  March 04, 2009 3:28 PM Summary of Call: Spoke with pt and she states that Zocor made her sick and that is why she stopped taking this medciation. What would you like to start her on?  Initial call taken by: Army Fossa CMA,  March 04, 2009 3:28 PM  Follow-up for Phone Call        crestor 5 mg  #30  1 by mouth every other day ---can try some samples first Follow-up by: Loreen Freud DO,  March 04, 2009 4:12 PM  Additional Follow-up for Phone Call Additional follow up Details #1::        pt aware, samples upfront.  Additional Follow-up by: Army Fossa CMA,  March 04, 2009 4:14 PM    New/Updated Medications: CRESTOR 5 MG TABS (ROSUVASTATIN CALCIUM) 1 by mouth every other day.

## 2010-05-05 NOTE — Progress Notes (Signed)
Summary: SICK-CAN SHE STILL DO COLON?  Phone Note Call from Patient Call back at Home Phone [phone]   Call For: DR [facility] Reason for Call: Talk to Nurse Summary of Call: WENT TO HER PRIMARY THIS AM WHO FOUND SINUS INFECTION & BROCHITIS. IS ON ANTIBIOTICS. CAN SHE STILL DO PROCEDURE ON 11-15-07? Initial call taken by: Leanor Kail [facility],  November 08, 2007 1:29 PM  Follow-up for Phone Call        Pt. started antibiotocs for a sinus infection and bronchitis. She is scheduled for colonoscopy on 11-15-07. As long as she is feeling better and doesn't have a fever she can have colon done. Pt. instructed to take antibiotics as directed. Pt. instructed to call back as needed.  Follow-up by: Laureen Ochs LPN,  November 08, 2007 1:46 PM

## 2010-05-05 NOTE — Procedures (Signed)
Summary: Colonoscopy   Colonoscopy  Procedure date:  11/15/2007  Findings:      Location:  [facility].    Procedures Next Due Date:    Colonoscopy: 11/2017  Patient Name: Erin [name], Erin [name] MRN: [id number] Procedure Procedures: Colonoscopy CPT: 04540.  Personnel: Endoscopist: Barbette Hair. Arlyce Dice, MD.  Exam Location: Outpatient  Patient Consent: Procedure, Alternatives, Risks and Benefits discussed, consent obtained, from patient.  Indications  Average Risk Screening Routine.  History  Current Medications: Patient is not currently taking Coumadin.  Pre-Exam Physical: Performed Nov 15, 2007. Cardio-pulmonary exam, HEENT exam , Abdominal exam, Mental status exam WNL.  Comments: Patient history reviewed/updated, physical performed prior to initiation of sedation?yes Exam Exam: Extent of exam reached: Cecum, extent intended: Cecum.  The cecum was identified by IC valve. Time to Cecum: 00:04:45. Time for Withdrawl: 00:04:37. Colon retroflexion performed. ASA Classification: II. Tolerance: good.  Monitoring: Pulse and BP monitoring, Oximetry used. Supplemental O2 given. at 2 Liters.  Colon Prep Used Miralax for colon prep. Prep results: excellent.  Sedation Meds: Patient assessed and found to be appropriate for moderate (conscious) sedation. Sedation was managed by the Endoscopist. Fentanyl 75 mcg. given IV. Versed 8 mg. given IV.  Findings - NORMAL EXAM: Cecum to Descending Colon.  NORMAL EXAM: Cecum.  - NORMAL EXAM: Sigmoid Colon to Rectum.  - DIVERTICULOSIS: Sigmoid Colon. ICD9: Diverticulosis: 562.10. Comments: Scattered diverticula.   Assessment Abnormal examination, see findings above.  Diagnoses: 562.10: Diverticulosis.   Events  Unplanned Interventions: No intervention was required.  Unplanned Events: There were no complications. Plans  Post Exam Instructions: Post sedation instructions given.  Patient Education: Patient given standard  instructions for: Diverticulosis.  Disposition: After procedure patient sent to recovery. After recovery patient sent home.  Scheduling/Referral: Colonoscopy, to Barbette Hair. Arlyce Dice, MD, around Nov 14, 2017.     cc.   Myrene Buddy Lowne,DO   This report was created from the original endoscopy report, which was reviewed and signed by the above listed endoscopist.

## 2010-05-05 NOTE — Letter (Signed)
Summary: Results Follow up Letter  [facility] [place]  [address] [place], [place] [address]   Phone: [phone]  Fax: [phone]    10/27/2007 MRN: [id number]  Erin [name] [address] [place], [place]  [address]  Dear [name],  The following are the results of your recent test(s):  Test         Result    Pap Smear:        Normal _____  Not Normal _____ Comments: ______________________________________________________ Cholesterol: LDL(Bad cholesterol):         Your goal is less than:         HDL (Good cholesterol):       Your goal is more than: Comments:  ______________________________________________________ Mammogram:        Normal _____  Not Normal _____ Comments:  ___________________________________________________________________ Hemoccult:        Normal _____  Not normal _______ Comments:    _____________________________________________________________________ Other Tests:    We routinely do not discuss normal results over the telephone.  If you desire a copy of the results, or you have any questions about this information we can discuss them at your next office visit.   Sincerely,

## 2010-05-07 NOTE — Letter (Signed)
Summary: [facility]: Immunoassay Fecal Occult Blood (iFOB) Order Form  [facility] [place]  [address] [place], [place] [address]   Phone: [phone]  Fax: [phone]      [facility]: Immunoassay Fecal Occult Blood (iFOB) Order Form   May 01, 2010 MRN: [id number]   Erin [name] 01/15/1946   Physicican Name:______dr lowne___________________  Diagnosis Code:_______V76.51___________________      Jeremy Johann CMA

## 2010-05-07 NOTE — Assessment & Plan Note (Signed)
Summary: LEG PROBLEM/KN   Vital Signs:  Patient profile:   65 year old female Height:      65 inches Weight:      237.0 pounds BMI:     39.58 O2 Sat:      100 % on Room air Temp:     97.8 degrees F oral Pulse rate:   95 / minute Pulse rhythm:   regular BP sitting:   150 / 90  (right arm) Cuff size:   large  Vitals Entered By: Almeta Monas CMA Duncan Dull) (April 07, 2010 10:32 AM)  O2 Flow:  Room air CC: xfew months c/o pain to the right calf and knee worst after working all day--- also c/o URI and cough with green mucus   History of Present Illness: Pt here c/o R knee pain--x few months.  No known injury.   Pt took advil with little relief.   Pt also ran out of meds and did not call for refill..  She states she did not have insurance and could not afford meds.  Current Medications (verified): 1)  Lisinopril-Hydrochlorothiazide 20-25 Mg Tabs (Lisinopril-Hydrochlorothiazide) .Marland Kitchen.. 1 By Mouth Once Daily 2)  Mirapex 0.125 Mg Tabs (Pramipexole Dihydrochloride) .Marland Kitchen.. 1-2 By Mouth At Bedtime 3)  Celexa 10 Mg Tabs (Citalopram Hydrobromide) .Marland Kitchen.. 1 By Mouth Once Daily 4)  Mobic 15 Mg Tabs (Meloxicam) .... 1/2 - 1 By Mouth Once Daily As Needed Pain 5)  Knee Sleeve  Xl .... 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 Family History of Asthma Family History of CAD Female 1st degree relative <60 Family History of Stroke F 1st degree relative <60 Family History Uterine cancer Leukemia stomach cancer  Social History: Reviewed history from 10/16/2007 and no changes required. 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 Lungs are clear to auscultation, no crackles or wheezes. Heart:  normal rate and no murmur.   Msk:  no joint swelling, no joint warmth, no redness over joints, no joint deformities, and no joint instability.   Extremities:  left pretibial edema and right pretibial edema.   no calf pain  Neurologic:  alert & oriented X3 and strength normal in all extremities.   Psych:  Oriented X3 and normally interactive.     Impression & Recommendations:  Problem # 1:  KNEE PAIN, RIGHT (ICD-719.46)  Her updated medication list for this problem includes:    Mobic 15 Mg Tabs (Meloxicam) .Marland Kitchen... 1/2 - 1 by mouth once daily as needed pain  Orders: T-Knee Comp Right 4 Views [id number])  Problem # 2:  HYPERTENSION, ESSENTIAL NOS (  ICD-401.9)  The following medications were removed from the medication list:    Lisinopril-hydrochlorothiazide 20-25 Mg Tabs (Lisinopril-hydrochlorothiazide) .Marland Kitchen... 1 by mouth once daily Her updated medication list for this problem includes:    Lisinopril-hydrochlorothiazide 20-25 Mg Tabs (Lisinopril-hydrochlorothiazide) .Marland Kitchen... 1 by mouth once  daily  Orders: Prescription Created Electronically [phone])  Problem # 3:  HYPERLIPIDEMIA (ICD-272.4)  The following medications were removed from the medication list:    Fenofibrate 160 Mg Tabs (Fenofibrate) .Marland Kitchen... 1 by mouth once daily    Crestor 5 Mg Tabs (Rosuvastatin calcium) .Marland Kitchen... 1 by mouth every other day.  Labs Reviewed: SGOT: 35 (03/03/2009)   SGPT: 35 (03/03/2009)   HDL:45.90 (03/03/2009), 43.50 (12/02/2008)  LDL:DEL (10/16/2007), DEL (11/08/2006)  Chol:248 (03/03/2009), 277 (12/02/2008)  Trig:281.0 (03/03/2009), 271.0 (12/02/2008)  Complete Medication List: 1)  Lisinopril-hydrochlorothiazide 20-25 Mg Tabs (Lisinopril-hydrochlorothiazide) .Marland Kitchen.. 1 by mouth once daily 2)  Mirapex 0.125 Mg Tabs (Pramipexole dihydrochloride) .Marland Kitchen.. 1-2 by mouth at bedtime 3)  Celexa 10 Mg Tabs (Citalopram hydrobromide) .Marland Kitchen.. 1 by mouth once daily 4)  Mobic 15 Mg Tabs (Meloxicam) .... 1/2 - 1 by mouth once daily as needed pain 5)  Knee Sleeve Xl  .... As directed  Patient Instructions: 1)  keep appointment for physical ---come fasting so we can get labs drawn Prescriptions: KNEE SLEEVE  XL as directed  #1 x 0   Entered and Authorized by:   Loreen Freud DO   Signed by:   Loreen Freud DO on 04/07/2010   Method used:   Print then Give to Patient   RxID:   [id number] MOBIC 15 MG TABS (MELOXICAM) 1/2 - 1 by mouth once daily as needed pain  #30 x 2   Entered and Authorized by:   Loreen Freud DO   Signed by:   Loreen Freud DO on 04/07/2010   Method used:   Electronically to        [facility] (retail)       [address]       [place], [place]  [address]       Ph: [phone]       Fax: [phone]   RxID:   [id number] CELEXA 10 MG TABS (CITALOPRAM HYDROBROMIDE) 1 by mouth once daily  #30 x 5   Entered and Authorized by:   Loreen Freud DO   Signed by:   Loreen Freud DO on 04/07/2010   Method used:   Electronically to        [facility].* (retail)       [address]       [place], [place]  [address]       Ph: [phone]       Fax: [phone]   RxID:   [id number] MIRAPEX 0.125 MG TABS (PRAMIPEXOLE DIHYDROCHLORIDE) 1-2 by mouth at bedtime  #60 x 5   Entered and Authorized by:   Loreen Freud DO   Signed by:   Loreen Freud DO on 04/07/2010   Method used:   Electronically to        [facility] (retail)       [address]       [place], [place]  [address]       Ph: [phone]       Fax: [phone]   RxID:  [id number] LISINOPRIL-HYDROCHLOROTHIAZIDE 20-25 MG TABS (LISINOPRIL-HYDROCHLOROTHIAZIDE) 1 by mouth once daily  #90 x 3   Entered and Authorized by:   Loreen Freud DO   Signed by:   Loreen Freud DO on 04/07/2010   Method used:   Electronically to        [facility] (retail)       [address]       [place], [place]  [address]       Ph: [phone]       Fax: [phone]   RxID:   [id number]    Orders Added: 1)  Est. Patient Level III [54627] 2)  T-Knee Comp Right 4 Views [73564TC] 3)  Prescription Created Electronically [id number]

## 2010-05-07 NOTE — Assessment & Plan Note (Signed)
Summary: CPX/PAP/FASTING/KN   Vital Signs:  Patient profile:   65 year old female Menstrual status:  postmenopausal Height:      64.25 inches Weight:      237.4 pounds BMI:     40.58 Pulse rate:   80 / minute Pulse rhythm:   regular BP sitting:   146 / 72  (right arm) Cuff size:   large  Vitals Entered By: Almeta Monas CMA Duncan Dull) (April 29, 2010 9:11 AM) CC: CPX/Fasting--needs pap-- per pt meds not working well (Celexa,Mirapex)  Does patient need assistance? Functional Status Self care, Cook/clean, Shopping, Social activities Ambulation Normal Comments Pt is able to do all ADLS and can read and write  Vision Screening:      Vision Comments: optho-- last visit 3 years ago 40db HL: Left  Right  Audiometry Comment: grossly normal      Menstrual Status postmenopausal Last PAP Result NEGATIVE FOR INTRAEPITHELIAL LESIONS OR MALIGNANCY.   History of Present Illness: Pt here for cpe, pap and labs.   Pt c/o mirapex and celexa not working anymore.  She feels that the celexa was making her RLS worse.    Hyperlipidemia follow-up      This is a 65 year old woman who presents for Hyperlipidemia follow-up.  The patient denies muscle aches, GI upset, abdominal pain, flushing, itching, constipation, diarrhea, and fatigue.  The patient denies the following symptoms: chest pain/pressure, exercise intolerance, dypsnea, palpitations, syncope, and pedal edema.  Compliance with medications (by patient report) has been near 100%.  Dietary compliance has been good.  The patient reports exercising occasionally.    Hypertension follow-up      The patient also presents for Hypertension follow-up.  The patient denies lightheadedness, urinary frequency, headaches, edema, impotence, rash, and fatigue.  The patient denies the following associated symptoms: chest pain, chest pressure, exercise intolerance, dyspnea, palpitations, syncope, leg edema, and pedal edema.  Compliance with medications (by  patient report) has been near 100%.  The patient reports that dietary compliance has been good.  The patient reports exercising occasionally.  Adjunctive measures currently used by the patient include salt restriction.    Preventive Screening-Counseling & Management  Alcohol-Tobacco     Alcohol drinks/day: 0     Smoking Status: never     Passive Smoke Exposure: no  Caffeine-Diet-Exercise     Caffeine use/day: 1     Does Patient Exercise: yes     Type of exercise: curves     Times/week: <3     Exercise Counseling: to improve exercise regimen  Hep-HIV-STD-Contraception     HIV Risk: no     Dental Visit-last 6 months no     Dental Care Counseling: to seek dental care; no dental care within six months     SBE monthly: no     SBE Education/Counseling: to perform regular SBE  Safety-Violence-Falls     Seat Belt Use: yes     Firearms in the Home: no firearms in the home     Firearm Counseling: not applicable     Smoke Detectors: yes     Smoke Detector Counseling: no     Violence in the Home: no risk noted     Sexual Abuse: no     Fall Risk: no      Sexual History:  currently monogamous.    Current Medications (verified): 1)  Lisinopril-Hydrochlorothiazide 20-25 Mg Tabs (Lisinopril-Hydrochlorothiazide) .Marland Kitchen.. 1 By Mouth Once Daily 2)  Zoloft 50 Mg Tabs (Sertraline Hcl) .Marland Kitchen.. 1 By  Mouth Once Daily 3)  Mobic 15 Mg Tabs (Meloxicam) .... 1/2 - 1 By Mouth Once Daily As Needed Pain 4)  Knee Sleeve  Xl .... As Directed 5)  Mirapex 1 Mg Tabs (Pramipexole Dihydrochloride) .Marland Kitchen.. 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 Family History of Asthma Family History of CAD Female 1st degree relative <60 Family History of Stroke F 1st degree relative <60 Family History Uterine cancer Leukemia stomach cancer Family History of Stroke M 1st degree relative 65yo  Social History: Reviewed history from 10/16/2007 and no changes required. 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 Eyes:  Denies blurring, discharge, double vision, eye irritation, eye pain, halos, itching, light sensitivity, red eye, vision loss-1 eye, and vision loss-both eyes. ENT:  Denies decreased hearing, difficulty swallowing, ear discharge, earache, hoarseness, nasal congestion, nosebleeds, postnasal drainage, ringing in ears, sinus pressure, and sore throat. CV:  Denies bluish discoloration of lips or nails, chest pain or discomfort, difficulty breathing at night, difficulty breathing while lying down, fainting, fatigue, leg cramps with exertion, lightheadness, near fainting, palpitations, shortness of breath with exertion, swelling of feet, swelling of hands, and weight  gain. Resp:  Denies chest discomfort, chest pain with inspiration, cough, coughing up blood, excessive snoring, hypersomnolence, morning headaches, pleuritic, shortness of breath, sputum productive, and wheezing. GI:  Denies abdominal pain, bloody stools, change in bowel habits, constipation, dark tarry stools, diarrhea, excessive appetite, gas, hemorrhoids, indigestion, loss of appetite, nausea, vomiting, vomiting blood, and yellowish skin color. GU:  Denies abnormal vaginal bleeding, decreased libido, discharge, dysuria, genital sores, hematuria, incontinence, nocturia, urinary frequency, and urinary hesitancy. MS:  Denies joint pain, joint redness, joint swelling, loss of strength, low back pain, mid back pain, muscle aches, muscle , cramps, muscle weakness, stiffness, and thoracic pain. Derm:  Denies changes in color of skin, changes in nail beds, dryness, excessive perspiration, flushing, hair loss, insect bite(s), itching, lesion(s), poor wound healing, and rash. Neuro:  Denies brief paralysis, difficulty with concentration, disturbances in coordination, falling down, headaches, inability to speak, memory loss, numbness, poor balance, seizures, sensation of room spinning, tingling, tremors, visual disturbances, and weakness. Psych:  Complains of depression; denies alternate hallucination ( auditory/visual), anxiety, easily angered, easily tearful, irritability, mental problems, panic attacks, sense of great danger, suicidal thoughts/plans, thoughts of violence, unusual visions or sounds, and thoughts /plans of harming others. Endo:  Denies cold intolerance, excessive hunger, excessive thirst, excessive urination, heat intolerance, polyuria, and weight change. Heme:  Denies abnormal bruising, bleeding, enlarge lymph nodes, fevers, pallor, and skin discoloration. Allergy:  Denies hives or rash, itching eyes, persistent infections, seasonal allergies, and sneezing.  Physical Exam  General:   Well-developed,well-nourished,in no acute distress; alert,appropriate and cooperative throughout examination Head:  Normocephalic and atraumatic without obvious abnormalities. No apparent alopecia or balding. Eyes:  pupils equal, pupils round, pupils reactive to light, and no injection.   Ears:  External ear exam shows no significant lesions or deformities.  Otoscopic examination reveals clear canals, tympanic membranes are intact bilaterally without bulging, retraction, inflammation or discharge. Hearing is grossly normal bilaterally. Nose:  External nasal examination shows no deformity or inflammation. Nasal mucosa are pink and moist without lesions or exudates. Mouth:  Oral mucosa and oropharynx without lesions or exudates.  Teeth in good repair. Neck:  No deformities, masses, or tenderness noted.no carotid bruits.   Chest Wall:  No deformities, masses, or tenderness noted. Breasts:  No mass, nodules, thickening, tenderness, bulging, retraction, inflamation, nipple discharge or skin changes noted.   Lungs:  Normal respiratory effort, chest expands symmetrically. Lungs are clear to auscultation, no crackles or wheezes. Heart:  normal rate and no murmur.   Abdomen:  Bowel sounds positive,abdomen soft and non-tender without masses, organomegaly or hernias noted. Rectal:  No external abnormalities noted. Normal sphincter tone. No rectal masses or tenderness.  heme neg brown stool. Genitalia:  Pelvic Exam:        External: normal female genitalia without lesions or masses        Vagina: normal without lesions or masses        Cervix: normal without lesions or masses        Adnexa: normal bimanual exam without masses or fullness        Uterus: normal by palpation        Pap smear: performed Msk:  normal ROM, no joint tenderness, no joint swelling, no joint warmth, no redness over joints, no joint deformities, no joint instability, and no crepitation.   Pulses:  R and L  carotid,radial,femoral,dorsalis pedis and posterior tibial pulses are full and equal bilaterally Extremities:  No clubbing, cyanosis, edema, or deformity noted with normal full range of motion of all joints.   Neurologic:  alert & oriented X3, cranial nerves II-XII intact, strength normal in all extremities, and gait normal.   Skin:  Intact without suspicious lesions or rashes Cervical Nodes:  No lymphadenopathy noted Axillary Nodes:  No palpable lymphadenopathy Psych:  Oriented X3, normally interactive, good eye contact, not anxious appearing, not depressed appearing, and not suicidal.     Impression & Recommendations:  Problem # 1:  PREVENTIVE HEALTH CARE (ICD-V70.0)  Orders: Venipuncture (82956) TLB-Lipid Panel (80061-LIPID) TLB-BMP (Basic Metabolic Panel-BMET) (80048-METABOL) TLB-CBC Platelet - w/Differential (85025-CBCD) TLB-Hepatic/Liver Function Pnl (80076-HEPATIC) T-Vitamin D (25-Hydroxy) (21308-65784) Specimen Handling (69629) Radiology Referral (Radiology) Medicare -1st Annual Wellness Visit [phone]) EKG w/ Interpretation (93000)  Problem # 2:  RESTLESS LEG SYNDROME, MILD (ICD-333.94)  Problem # 3:  HYPERTENSION, ESSENTIAL NOS (ICD-401.9)  Her updated medication list for this problem includes:    Lisinopril-hydrochlorothiazide 20-25 Mg Tabs (Lisinopril-hydrochlorothiazide) .Marland Kitchen... 1 by mouth once daily  Orders: Venipuncture (32440) TLB-Lipid Panel (80061-LIPID) TLB-BMP (Basic Metabolic Panel-BMET) (80048-METABOL) TLB-CBC Platelet - w/Differential (85025-CBCD) TLB-Hepatic/Liver Function Pnl (80076-HEPATIC) T-Vitamin D (25-Hydroxy) (10272-53664) Specimen Handling (40347)  BP today: 146/72 Prior BP: 150/90 (04/07/2010)  Labs Reviewed: K+: 4.3 (03/03/2009) Creat: : 1.2 (03/03/2009)   Chol: 248 (03/03/2009)   HDL: 45.90 (03/03/2009)   LDL: DEL (10/16/2007)  TG: 281.0 (03/03/2009)  Problem # 4:  POSTMENOPAUSAL STATUS (ICD-627.2)  Orders: Venipuncture  (16109) TLB-Lipid Panel (80061-LIPID) TLB-BMP (Basic Metabolic Panel-BMET) (80048-METABOL) TLB-CBC Platelet - w/Differential (85025-CBCD) TLB-Hepatic/Liver Function Pnl (80076-HEPATIC) T-Vitamin D (25-Hydroxy) (60454-09811) Specimen Handling (91478) Radiology Referral (Radiology)  Problem # 5:  ANXIETY STATE NOS (ICD-300.00)  Her updated medication list for this problem includes:    Zoloft 50 Mg Tabs (Sertraline hcl) .Marland Kitchen... 1 by mouth once daily  Discussed medication use and relaxation techniques.   Problem # 6:  THROMBOCYTOPENIA NOS (ICD-287.5)  Orders: Venipuncture (29562) TLB-Lipid Panel (80061-LIPID) TLB-BMP (Basic Metabolic Panel-BMET) (80048-METABOL) TLB-CBC Platelet - w/Differential (85025-CBCD) TLB-Hepatic/Liver Function Pnl (80076-HEPATIC) T-Vitamin D (25-Hydroxy) (13086-57846) Specimen Handling (96295)  Problem # 7:  HYPERLIPIDEMIA (ICD-272.4)  Orders: Venipuncture (28413) TLB-Lipid Panel (80061-LIPID) TLB-BMP (Basic Metabolic Panel-BMET) (80048-METABOL) TLB-CBC Platelet - w/Differential (85025-CBCD) TLB-Hepatic/Liver Function Pnl (80076-HEPATIC) T-Vitamin D (25-Hydroxy) (24401-02725) Specimen Handling (36644)  Labs Reviewed: SGOT: 35 (03/03/2009)   SGPT: 35 (03/03/2009)   HDL:45.90 (03/03/2009), 43.50 (12/02/2008)  LDL:DEL (10/16/2007), DEL (11/08/2006)  Chol:248 (03/03/2009), 277 (12/02/2008)  Trig:281.0 (03/03/2009), 271.0 (12/02/2008)  Complete Medication List: 1)  Lisinopril-hydrochlorothiazide 20-25 Mg Tabs (Lisinopril-hydrochlorothiazide) .Marland Kitchen.. 1 by mouth once daily 2)  Zoloft 50 Mg Tabs (Sertraline hcl) .Marland Kitchen.. 1 by mouth once daily 3)  Mobic 15 Mg Tabs (Meloxicam) .... 1/2 - 1 by mouth once daily as needed pain 4)  Knee Sleeve Xl  .... As directed 5)  Mirapex 1 Mg Tabs (Pramipexole dihydrochloride) .Marland Kitchen.. 1 by mouth at bedtime  Other Orders: Pneumococcal Vaccine (03474) Admin 1st Vaccine (25956) Prescriptions: MIRAPEX 1 MG TABS (PRAMIPEXOLE  DIHYDROCHLORIDE) 1 by mouth at bedtime  #90 x 3   Entered and Authorized by:   Loreen Freud DO   Signed by:   Loreen Freud DO on 04/29/2010   Method used:   Electronically to        [facility].* (retail)       [address]       [place], [place]  [address]       Ph: [phone]       Fax: [phone]   RxID:   [id number] ZOLOFT 50 MG TABS (SERTRALINE HCL) 1 by mouth once daily  #30 x 2   Entered and Authorized by:   Loreen Freud DO   Signed by:   Loreen Freud DO on 04/29/2010   Method used:   Electronically to        [facility] (retail)       [address]       [place], [place]  [address]       Ph: [phone]       Fax: [phone]   RxID:   [id number]    Orders Added: 1)  Venipuncture [38182] 2)  TLB-Lipid Panel [80061-LIPID] 3)  TLB-BMP (Basic Metabolic Panel-BMET) [80048-METABOL] 4)  TLB-CBC Platelet - w/Differential [85025-CBCD] 5)  TLB-Hepatic/Liver Function Pnl [80076-HEPATIC] 6)  T-Vitamin D (25-Hydroxy) [99371-69678] 7)  Pneumococcal Vaccine [90732] 8)  Admin 1st Vaccine [90471] 9)  Specimen Handling [99000] 10)  Radiology Referral [Radiology] 11)  Radiology Referral [Radiology] 12)  Medicare -1st Annual Wellness Visit [G0438] 13)  EKG w/ Interpretation [93000] 14)  Est. Patient Level III [93810]   Immunizations Administered:  Pneumonia Vaccine:    Vaccine Type: Pneumovax (Medicare)    Site: left deltoid    Mfr: Merck  Dose: 0.5 ml    Route: IM    Given by: Almeta Monas CMA (AAMA)    Exp. Date: 08/05/2011    Lot #: [id number]    VIS given: 03/10/09 version given April 29, 2010.   Immunizations Administered:  Pneumonia Vaccine:    Vaccine Type: Pneumovax (Medicare)    Site: left deltoid    Mfr: Merck    Dose: 0.5 ml    Route: IM    Given by: Almeta Monas CMA (AAMA)    Exp. Date: 08/05/2011    Lot #: [id number]    VIS given: 03/10/09 version given April 29, 2010.  Last Flu Vaccine:  Fluvax 3+ (01/06/2009 8:54:36 AM) Flu Vaccine Next Due:  Refused Hemoccult Result Date:  04/29/2010 Hemoccult Result:  normal Hemoccult Next Due:  1 yr

## 2010-05-13 NOTE — Letter (Signed)
Summary: [facility]   Imported By: Lanelle Bal 05/07/2010 09:31:30  _____________________________________________________________________  External Attachment:    Type:   Image     Comment:   External Document

## 2010-05-21 ENCOUNTER — Other Ambulatory Visit: Payer: Self-pay | Admitting: Family Medicine

## 2010-05-21 ENCOUNTER — Encounter (INDEPENDENT_AMBULATORY_CARE_PROVIDER_SITE_OTHER): Payer: Self-pay | Admitting: *Deleted

## 2010-05-21 ENCOUNTER — Other Ambulatory Visit: Payer: Medicare Other

## 2010-05-21 DIAGNOSIS — Z1211 Encounter for screening for malignant neoplasm of colon: Secondary | ICD-10-CM

## 2010-05-21 LAB — FECAL OCCULT BLOOD, IMMUNOCHEMICAL: Fecal Occult Bld: NEGATIVE

## 2010-05-21 NOTE — Medication Information (Signed)
[facility]   Imported By: Kassie Mends 05/15/2010 08:34:11  _____________________________________________________________________  External Attachment:    Type:   Image     Comment:   External Document

## 2010-06-18 DIAGNOSIS — E559 Vitamin D deficiency, unspecified: Secondary | ICD-10-CM | POA: Insufficient documentation

## 2010-07-06 ENCOUNTER — Encounter: Payer: Self-pay | Admitting: Family Medicine

## 2010-07-20 ENCOUNTER — Encounter: Payer: Self-pay | Admitting: Family Medicine

## 2010-07-27 ENCOUNTER — Encounter: Payer: Self-pay | Admitting: Family Medicine

## 2010-08-03 ENCOUNTER — Ambulatory Visit (INDEPENDENT_AMBULATORY_CARE_PROVIDER_SITE_OTHER): Payer: Medicare Other | Admitting: Family Medicine

## 2010-08-03 ENCOUNTER — Encounter: Payer: Self-pay | Admitting: Family Medicine

## 2010-08-03 VITALS — BP 124/66 | HR 64 | Wt 246.0 lb

## 2010-08-03 DIAGNOSIS — M1990 Unspecified osteoarthritis, unspecified site: Secondary | ICD-10-CM

## 2010-08-03 DIAGNOSIS — E785 Hyperlipidemia, unspecified: Secondary | ICD-10-CM

## 2010-08-03 DIAGNOSIS — I10 Essential (primary) hypertension: Secondary | ICD-10-CM

## 2010-08-03 LAB — HEPATIC FUNCTION PANEL
ALT: 29 U/L (ref 0–35)
AST: 28 U/L (ref 0–37)
Albumin: 3.9 g/dL (ref 3.5–5.2)
Alkaline Phosphatase: 82 U/L (ref 39–117)
Bilirubin, Direct: 0.1 mg/dL (ref 0.0–0.3)
Total Bilirubin: 0.5 mg/dL (ref 0.3–1.2)
Total Protein: 7 g/dL (ref 6.0–8.3)

## 2010-08-03 LAB — BASIC METABOLIC PANEL
BUN: 24 mg/dL — ABNORMAL HIGH (ref 6–23)
CO2: 26 mEq/L (ref 19–32)
Calcium: 9.2 mg/dL (ref 8.4–10.5)
Chloride: 102 mEq/L (ref 96–112)
Creatinine, Ser: 1.3 mg/dL — ABNORMAL HIGH (ref 0.4–1.2)
GFR: 44.85 mL/min — ABNORMAL LOW (ref >60.00)
Glucose, Bld: 121 mg/dL — ABNORMAL HIGH (ref 70–99)
Potassium: 4.8 mEq/L (ref 3.5–5.1)
Sodium: 136 mEq/L (ref 135–145)

## 2010-08-03 LAB — POCT URINALYSIS DIPSTICK
Bilirubin, UA: NEGATIVE
Blood, UA: NEGATIVE
Glucose, UA: NEGATIVE
Ketones, UA: NEGATIVE
Leukocytes, UA: NEGATIVE
Nitrite, UA: NEGATIVE
Protein, UA: NEGATIVE
Spec Grav, UA: 1.015
Urobilinogen, UA: 0.2
pH, UA: 5

## 2010-08-03 LAB — LIPID PANEL
Cholesterol: 191 mg/dL (ref 0–200)
HDL: 42.9 mg/dL (ref >39.00)
Total CHOL/HDL Ratio: 4
Triglycerides: 296 mg/dL — ABNORMAL HIGH (ref 0.0–149.0)
VLDL: 59.2 mg/dL — ABNORMAL HIGH (ref 0.0–40.0)

## 2010-08-03 LAB — LDL CHOLESTEROL, DIRECT: Direct LDL: 100 mg/dL

## 2010-08-03 MED ORDER — MELOXICAM 15 MG PO TABS: 15.0000 mg | ORAL_TABLET | Freq: Every day | ORAL | Status: DC | PRN

## 2010-08-03 NOTE — Progress Notes (Signed)
Subjective:    Erin [name] is a 65 y.o. female here for follow up of dyslipidemia. The patient does not use medications that may worsen dyslipidemias (corticosteroids, progestins, anabolic steroids, diuretics, beta-blockers, amiodarone, cyclosporine, olanzapine). The patient exercises never. The patient is not known to have coexisting coronary artery disease.   Cardiac Risk Factors Age > 45-female, > 55-female:  YES  +1  Smoking:   NO  Sig. family hx of CHD*:  YES  +1  Hypertension:   YES  +1  Diabetes:   NO  HDL < 35:   NO  HDL > 59:   NO  Total: 3   *- Sig. family h/o CHD per NCEP = MI or sudden death at <55yo in  father or other 1st-degree female relative, or <65yo in mother or  other 1st-degree female relative  The following portions of the patient's history were reviewed and updated as appropriate: allergies, current medications, past family history, past medical history, past social history, past surgical history and problem list.  Review of Systems Pertinent items are noted in HPI.    Objective:    BP 124/66  Pulse 64  Wt 246 lb (111.585 kg) General appearance: alert, cooperative, appears stated age, no distress and moderately obese Neck: no adenopathy, no carotid bruit, no JVD, supple, symmetrical, trachea midline and thyroid not enlarged, symmetric, no tenderness/mass/nodules Lungs: clear to auscultation bilaterally Heart: regular rate and rhythm, S1, S2 normal, no murmur, click, rub or gallop Extremities: edema trace  Lab Review Lab Results  Component Value Date   CHOL 224* 04/29/2010   CHOL 248* 03/03/2009   CHOL 277* 12/02/2008   HDL 41.40 04/29/2010   HDL 11.91 03/03/2009   HDL 43.50 12/02/2008   LDLDIRECT 142.2 04/29/2010   LDLDIRECT 145.6 03/03/2009   LDLDIRECT 168.4 12/02/2008      Assessment:    Dyslipidemia as detailed above with 3 CHD risk factors using NCEP scheme above.  Target levels for LDL are: < 100 mg/dl (CHD or "CHD risk equivalent" is  present)  Explained to the patient the respective contributions of genetics, diet, and exercise to lipid levels and the use of medication in severe cases which do not respond to lifestyle alteration. The patient's interest and motivation in making lifestyle changes seems fair.    Plan:    The following changes are planned for the next 3 months, at which time the patient will return for repeat fasting lipids:  1. Dietary changes: Increase soluble fiber Plant sterols 2grams per day (e.g. Benecol): yes Reduce saturated fat, "trans" monounsaturated fatty acids, and cholesterol 2. Exercise changes:  try to work up to 3 days a week for at least 30 min 3. Other treatment: Treatment of hypertension (no change) Weight reduction (diet and exercise) 4. Lipid-lowering medications: cont current dose since she was taking it inconsistently  (Recommended by NCEP after 3-6 mos of dietary therapy & lifestyle modification,  except if CHD is present or LDL well above 190.) 5. Hormone replacement therapy (patient is a postmenopausal  woman): no2 6. Screening for secondary causes of dyslipidemias: None indicated 7. Lipid screening for relatives:na 8. Follow up: 3 months.  Note: The majority of the visit was spent in counseling on the pathophysiology and treatment of dyslipidemias. The total face-to-face time was in excess of 20 minutes.  Subjective:    Patient here for follow-up of elevated blood pressure.  She is not exercising and is not adherent to a low-salt diet.  Blood pressure is not  being checked  at home. Cardiac symptoms: none. Patient denies: chest pain, dyspnea, exertional chest pressure/discomfort and palpitations. Cardiovascular risk factors: advanced age (older than 56 for men, 47 for women), dyslipidemia, hypertension, obesity (BMI >= 30 kg/m2) and sedentary lifestyle. Use of agents associated with hypertension: none. History of target organ damage: none.  The following portions of the  patient's history were reviewed and updated as appropriate: allergies, current medications, past family history, past medical history, past social history, past surgical history and problem list.  Review of Systems Pertinent items are noted in HPI.     Objective:    see above    Assessment:    Hypertension, normal blood pressure . Evidence of target organ damage: none.    Plan:    Medication: no change.

## 2010-08-03 NOTE — Patient Instructions (Signed)

## 2010-08-04 ENCOUNTER — Telehealth: Payer: Self-pay | Admitting: *Deleted

## 2010-08-04 ENCOUNTER — Encounter: Payer: Self-pay | Admitting: *Deleted

## 2010-08-04 MED ORDER — LISINOPRIL-HYDROCHLOROTHIAZIDE 20-12.5 MG PO TABS: 1.0000 | ORAL_TABLET | Freq: Every day | ORAL | Status: DC

## 2010-08-04 NOTE — Telephone Encounter (Addendum)
Pt aware Rx sent to pharmacy, copy of labs mailed  Message copied by Candie Echevaria on Tue Aug 04, 2010 12:17 PM ------      Message from: Loreen Freud      Created: Mon Aug 03, 2010  5:25 PM       Change to lisinopril 20/12.5  Secondary to Bun and cr elevated--- drinking plenty water--- recheck 2-4 weeks 401.9  Bmp      Cholesterol--- LDL goal < 100,  HDL >40,  TG < 150.  Diet and exercise will increase HDL and decrease LDL and TG.  Fish,  Fish Oil, Flaxseed oil will also help increase the HDL and decrease Triglycerides.   Recheck labs in 3 months.   272.4  Lipid, hep

## 2010-10-05 ENCOUNTER — Encounter: Payer: Self-pay | Admitting: Family Medicine

## 2010-10-05 ENCOUNTER — Ambulatory Visit (INDEPENDENT_AMBULATORY_CARE_PROVIDER_SITE_OTHER): Payer: Medicare Other | Admitting: Family Medicine

## 2010-10-05 VITALS — BP 142/88 | HR 97 | Temp 98.7°F | Wt 248.6 lb

## 2010-10-05 DIAGNOSIS — J329 Chronic sinusitis, unspecified: Secondary | ICD-10-CM

## 2010-10-05 MED ORDER — CEFUROXIME AXETIL 500 MG PO TABS: 500.0000 mg | ORAL_TABLET | Freq: Two times a day (BID) | ORAL | Status: AC

## 2010-10-05 MED ORDER — PREDNISONE 10 MG PO TABS: 10.0000 mg | ORAL_TABLET | Freq: Every day | ORAL | Status: AC

## 2010-10-05 NOTE — Patient Instructions (Signed)

## 2010-10-05 NOTE — Progress Notes (Signed)
  Subjective:     Erin [name] is a 65 y.o. female who presents for evaluation of sinus pain. Symptoms include: congestion, cough, facial pain, nasal congestion and sinus pressure. Onset of symptoms was 6 days ago. Symptoms have been gradually worsening since that time. Past history is significant for no history of pneumonia or bronchitis. Patient is a former smoker, quit 25 years ago.  The following portions of the patient's history were reviewed and updated as appropriate: allergies, current medications, past family history, past medical history, past social history, past surgical history and problem list.  Review of Systems Pertinent items are noted in HPI.   Objective:    BP 142/88  Pulse 97  Temp(Src) 98.7 F (37.1 C) (Oral)  Wt 248 lb 9.6 oz (112.764 kg)  SpO2 95% General appearance: alert, cooperative, appears stated age and no distress Ears: normal TM's and external ear canals both ears Nose: Nares normal. Septum midline. Mucosa normal. No drainage or sinus tenderness., green discharge, severe congestion, sinus tenderness bilateral Throat: lips, mucosa, and tongue normal; teeth and gums normal Neck: mild anterior cervical adenopathy, supple, symmetrical, trachea midline and thyroid not enlarged, symmetric, no tenderness/mass/nodules Lungs: clear to auscultation bilaterally Heart: regular rate and rhythm, S1, S2 normal, no murmur, click, rub or gallop Skin: Skin color, texture, turgor normal. No rashes or lesions    Assessment:    Acute bacterial sinusitis.    Plan:    Nasal steroids per medication orders. Antihistamines per medication orders. Ceftin per medication orders. Follow up in several days or as needed.

## 2010-10-26 ENCOUNTER — Ambulatory Visit: Payer: Medicare Other | Admitting: Family Medicine

## 2010-11-12 ENCOUNTER — Ambulatory Visit: Payer: Self-pay | Admitting: Family Medicine

## 2010-11-20 ENCOUNTER — Encounter: Payer: Self-pay | Admitting: Family Medicine

## 2010-11-20 ENCOUNTER — Ambulatory Visit (INDEPENDENT_AMBULATORY_CARE_PROVIDER_SITE_OTHER): Payer: Medicare Other | Admitting: Family Medicine

## 2010-11-20 DIAGNOSIS — M129 Arthropathy, unspecified: Secondary | ICD-10-CM

## 2010-11-20 DIAGNOSIS — F32A Depression, unspecified: Secondary | ICD-10-CM

## 2010-11-20 DIAGNOSIS — M1990 Unspecified osteoarthritis, unspecified site: Secondary | ICD-10-CM

## 2010-11-20 DIAGNOSIS — F329 Major depressive disorder, single episode, unspecified: Secondary | ICD-10-CM

## 2010-11-20 DIAGNOSIS — I10 Essential (primary) hypertension: Secondary | ICD-10-CM

## 2010-11-20 MED ORDER — SERTRALINE HCL 50 MG PO TABS: 50.0000 mg | ORAL_TABLET | Freq: Every day | ORAL | Status: DC

## 2010-11-20 MED ORDER — MELOXICAM 15 MG PO TABS: 15.0000 mg | ORAL_TABLET | Freq: Every day | ORAL | Status: DC | PRN

## 2010-11-20 MED ORDER — LISINOPRIL-HYDROCHLOROTHIAZIDE 20-12.5 MG PO TABS: 1.0000 | ORAL_TABLET | Freq: Every day | ORAL | Status: DC

## 2010-11-20 NOTE — Progress Notes (Signed)
  Subjective:    Patient here for follow-up of elevated blood pressure.  She is not exercising and is adherent to a low-salt diet.  Blood pressure is well controlled at home. Cardiac symptoms: none. Patient denies: chest pain, chest pressure/discomfort, claudication, dyspnea, exertional chest pressure/discomfort, fatigue, irregular heart beat, lower extremity edema, near-syncope, orthopnea, palpitations and paroxysmal nocturnal dyspnea. Cardiovascular risk factors: advanced age (older than 50 for men, 37 for women), dyslipidemia, hypertension, obesity (BMI >= 30 kg/m2) and sedentary lifestyle. Use of agents associated with hypertension: none. History of target organ damage: none.  The following portions of the patient's history were reviewed and updated as appropriate: allergies, current medications, past family history, past medical history, past social history, past surgical history and problem list.  Review of Systems Pertinent items are noted in HPI.     Objective:    BP 140/90  Pulse 88  Temp(Src) 97.9 F (36.6 C) (Oral)  Wt 253 lb (114.76 kg)  SpO2 98% General appearance: alert, cooperative, appears stated age and no distress Lungs: clear to auscultation bilaterally Heart: regular rate and rhythm, S1, S2 normal, no murmur, click, rub or gallop Extremities: edema b/l pitting    Assessment:    Hypertension, normal blood pressure . Evidence of target organ damage: none.   Hyperlipidemia-- con't meds and check labs  depression---stable , con't med Plan:    Medication: no change. Dietary sodium restriction. Regular aerobic exercise. Check blood pressures 2-3 times weekly and record. Follow up: 3 months and as needed.

## 2010-11-27 ENCOUNTER — Other Ambulatory Visit: Payer: Self-pay | Admitting: Family Medicine

## 2010-11-27 ENCOUNTER — Other Ambulatory Visit: Payer: Self-pay | Admitting: *Deleted

## 2010-11-27 DIAGNOSIS — I10 Essential (primary) hypertension: Secondary | ICD-10-CM

## 2010-11-27 DIAGNOSIS — E785 Hyperlipidemia, unspecified: Secondary | ICD-10-CM

## 2010-11-30 ENCOUNTER — Telehealth: Payer: Self-pay | Admitting: Family Medicine

## 2010-11-30 ENCOUNTER — Other Ambulatory Visit (INDEPENDENT_AMBULATORY_CARE_PROVIDER_SITE_OTHER): Payer: Medicare Other

## 2010-11-30 DIAGNOSIS — I10 Essential (primary) hypertension: Secondary | ICD-10-CM

## 2010-11-30 DIAGNOSIS — E785 Hyperlipidemia, unspecified: Secondary | ICD-10-CM

## 2010-11-30 LAB — LIPID PANEL
Cholesterol: 177 mg/dL (ref 0–200)
HDL: 48 mg/dL (ref >39.00)
LDL Cholesterol: 91 mg/dL (ref 0–99)
Total CHOL/HDL Ratio: 4
Triglycerides: 192 mg/dL — ABNORMAL HIGH (ref 0.0–149.0)
VLDL: 38.4 mg/dL (ref 0.0–40.0)

## 2010-11-30 LAB — POCT URINALYSIS DIPSTICK
Bilirubin, UA: NEGATIVE
Blood, UA: NEGATIVE
Glucose, UA: NEGATIVE
Ketones, UA: NEGATIVE
Leukocytes, UA: NEGATIVE
Nitrite, UA: NEGATIVE
Protein, UA: NEGATIVE
Spec Grav, UA: 1.005
Urobilinogen, UA: 0.2
pH, UA: 6.5

## 2010-11-30 LAB — HEPATIC FUNCTION PANEL
ALT: 41 U/L — ABNORMAL HIGH (ref 0–35)
AST: 34 U/L (ref 0–37)
Albumin: 4 g/dL (ref 3.5–5.2)
Alkaline Phosphatase: 90 U/L (ref 39–117)
Bilirubin, Direct: 0 mg/dL (ref 0.0–0.3)
Total Bilirubin: 0.3 mg/dL (ref 0.3–1.2)
Total Protein: 7.1 g/dL (ref 6.0–8.3)

## 2010-11-30 LAB — BASIC METABOLIC PANEL
BUN: 19 mg/dL (ref 6–23)
CO2: 25 mEq/L (ref 19–32)
Calcium: 9.1 mg/dL (ref 8.4–10.5)
Chloride: 104 mEq/L (ref 96–112)
Creatinine, Ser: 1 mg/dL (ref 0.4–1.2)
GFR: 57.05 mL/min — ABNORMAL LOW (ref >60.00)
Glucose, Bld: 124 mg/dL — ABNORMAL HIGH (ref 70–99)
Potassium: 4.3 mEq/L (ref 3.5–5.1)
Sodium: 139 mEq/L (ref 135–145)

## 2010-11-30 MED ORDER — ATORVASTATIN CALCIUM 20 MG PO TABS: 20.0000 mg | ORAL_TABLET | Freq: Every day | ORAL | Status: DC

## 2010-11-30 NOTE — Progress Notes (Signed)
Labs only

## 2010-11-30 NOTE — Telephone Encounter (Signed)
Rx faxed.    KP 

## 2010-12-11 ENCOUNTER — Telehealth: Payer: Self-pay | Admitting: *Deleted

## 2010-12-11 NOTE — Telephone Encounter (Signed)
noted 

## 2010-12-11 NOTE — Telephone Encounter (Signed)
Pt left VM that both legs are red, swollen, and warm to touch. Pt denies any tenderness or pain. Pt notes that she schedule OV for Tuesday. Pt advise to be seen in ED/UC today, Pt ok will go for evaluation today.

## 2010-12-15 ENCOUNTER — Ambulatory Visit: Payer: Medicare Other | Admitting: Family Medicine

## 2010-12-18 ENCOUNTER — Ambulatory Visit (INDEPENDENT_AMBULATORY_CARE_PROVIDER_SITE_OTHER): Payer: Medicare Other | Admitting: Family Medicine

## 2010-12-18 ENCOUNTER — Encounter: Payer: Self-pay | Admitting: Family Medicine

## 2010-12-18 ENCOUNTER — Ambulatory Visit (HOSPITAL_BASED_OUTPATIENT_CLINIC_OR_DEPARTMENT_OTHER)
Admission: RE | Admit: 2010-12-18 | Discharge: 2010-12-18 | Disposition: A | Discharge status: Home / Self Care | Payer: Medicare Other | Source: Ambulatory Visit | Attending: Family Medicine | Admitting: Family Medicine

## 2010-12-18 VITALS — BP 170/92 | HR 103 | Temp 97.9°F | Wt 256.6 lb

## 2010-12-18 DIAGNOSIS — I10 Essential (primary) hypertension: Secondary | ICD-10-CM

## 2010-12-18 DIAGNOSIS — R0602 Shortness of breath: Secondary | ICD-10-CM

## 2010-12-18 DIAGNOSIS — M545 Low back pain, unspecified: Secondary | ICD-10-CM | POA: Insufficient documentation

## 2010-12-18 DIAGNOSIS — I517 Cardiomegaly: Secondary | ICD-10-CM

## 2010-12-18 DIAGNOSIS — R609 Edema, unspecified: Secondary | ICD-10-CM

## 2010-12-18 DIAGNOSIS — R05 Cough: Secondary | ICD-10-CM | POA: Insufficient documentation

## 2010-12-18 DIAGNOSIS — R059 Cough, unspecified: Secondary | ICD-10-CM

## 2010-12-18 DIAGNOSIS — M47817 Spondylosis without myelopathy or radiculopathy, lumbosacral region: Secondary | ICD-10-CM | POA: Insufficient documentation

## 2010-12-18 LAB — CBC WITH DIFFERENTIAL/PLATELET
Basophils Absolute: 0 10*3/uL (ref 0.0–0.1)
Basophils Relative: 0.6 % (ref 0.0–3.0)
Eosinophils Absolute: 0.1 10*3/uL (ref 0.0–0.7)
Eosinophils Relative: 0.8 % (ref 0.0–5.0)
HCT: 35.7 % — ABNORMAL LOW (ref 36.0–46.0)
Hemoglobin: 11.6 g/dL — ABNORMAL LOW (ref 12.0–15.0)
Lymphocytes Relative: 21.3 % (ref 12.0–46.0)
Lymphs Abs: 1.4 10*3/uL (ref 0.7–4.0)
MCHC: 32.5 g/dL (ref 30.0–36.0)
MCV: 82.2 fl (ref 78.0–100.0)
Monocytes Absolute: 0.4 10*3/uL (ref 0.1–1.0)
Monocytes Relative: 5.8 % (ref 3.0–12.0)
Neutro Abs: 4.8 10*3/uL (ref 1.4–7.7)
Neutrophils Relative %: 71.5 % (ref 43.0–77.0)
Platelets: 320 10*3/uL (ref 150.0–400.0)
RBC: 4.34 Mil/uL (ref 3.87–5.11)
RDW: 20.2 % — ABNORMAL HIGH (ref 11.5–14.6)
WBC: 6.7 10*3/uL (ref 4.5–10.5)

## 2010-12-18 LAB — BASIC METABOLIC PANEL
BUN: 22 mg/dL (ref 6–23)
CO2: 26 mEq/L (ref 19–32)
Calcium: 9.3 mg/dL (ref 8.4–10.5)
Chloride: 101 mEq/L (ref 96–112)
Creatinine, Ser: 1.1 mg/dL (ref 0.4–1.2)
GFR: 52.87 mL/min — ABNORMAL LOW (ref >60.00)
Glucose, Bld: 119 mg/dL — ABNORMAL HIGH (ref 70–99)
Potassium: 4.6 mEq/L (ref 3.5–5.1)
Sodium: 136 mEq/L (ref 135–145)

## 2010-12-18 LAB — HEPATIC FUNCTION PANEL
ALT: 23 U/L (ref 0–35)
AST: 23 U/L (ref 0–37)
Albumin: 4.1 g/dL (ref 3.5–5.2)
Alkaline Phosphatase: 104 U/L (ref 39–117)
Bilirubin, Direct: 0 mg/dL (ref 0.0–0.3)
Total Bilirubin: 0.5 mg/dL (ref 0.3–1.2)
Total Protein: 7.7 g/dL (ref 6.0–8.3)

## 2010-12-18 MED ORDER — LISINOPRIL 40 MG PO TABS: 40.0000 mg | ORAL_TABLET | Freq: Every day | ORAL | Status: DC

## 2010-12-18 MED ORDER — FUROSEMIDE 20 MG PO TABS: 20.0000 mg | ORAL_TABLET | Freq: Two times a day (BID) | ORAL | Status: DC

## 2010-12-18 NOTE — Progress Notes (Signed)
  Subjective:    Erin [name] is a 65 y.o. female who presents for evaluation of edema in both lower legs. The edema has been moderate. Onset of symptoms was 7 days ago, and patient reports symptoms have gradually worsened since that time. The edema is present all day. The patient states the problem is new. The swelling has been aggravated by dependency of involved area. The swelling has been relieved by nothing. Associated factors include: shortness of breath. Cardiac risk factors: advanced age (older than 39 for men, 34 for women), dyslipidemia, hypertension, obesity (BMI >= 30 kg/m2) and sedentary lifestyle.  The following portions of the patient's history were reviewed and updated as appropriate: allergies, current medications, past family history, past medical history, past social history, past surgical history and problem list.  Review of Systems Pertinent items are noted in HPI.   Objective:    BP 170/92  Pulse 103  Temp(Src) 97.9 F (36.6 C) (Oral)  Wt 256 lb 9.6 oz (116.393 kg)  SpO2 98% General appearance: alert, cooperative, appears stated age and no distress Throat: lips, mucosa, and tongue normal; teeth and gums normal Back: symmetric, no curvature. ROM normal. No CVA tenderness. Lungs: clear to auscultation bilaterally Heart: S1, S2 normal Extremities: edema +2 pitting edema   Cardiographics ECG: no change from previous one  Imaging Chest x-ray: not available for review and pending review by radiologist   Assessment:     Edema secondary to dependency--- bp also very elevated today  Plan:    Recommendations: decrease sodium in the diet and elevate feet above the level of the heart whenever possible. The patient was also instructed to call IMMEDIATELY (i.e., day or night) if any cardiopulmonary symptoms occur, especially chest pain, shortness of breath, dyspnea on exertion, paroxysmal nocturnal dyspnea, or orthopnea, and these were explained. Follow up in 2 weeks  and as needed.  Lasix 20 mg qd Drink oj or eat banana daily Increase lisinopril to 40 mg

## 2010-12-18 NOTE — Patient Instructions (Signed)
Edema Edema is an abnormal build-up of fluids in tissues. Because this is partly dependent on gravity (water flows to the lowest place), it is more common in the lower extremities (legs and thighs). It is also common in the looser tissues, like around the eyes. Painless swelling of the feet and ankles is common and increases as a person ages. It may affect both legs and may include the calves or even thighs. When squeezed, the fluid may move out of the affected area and may leave a dent for a few moments. CAUSES  Prolonged standing or sitting in one place for extended periods of time. Movement helps pump tissue fluid into the veins, and absence of movement prevents this, resulting in edema.   Varicose veins. The valves in the veins do not work as well as they should. This causes fluid to leak into the tissues.   Fluid and salt overload.   Injury, burn, or surgery to the leg, ankle, or foot, may damage veins and allow fluid to leak out.   Sunburn damages vessels. Leaky vessels allow fluid to go out into the sunburned tissues.   Allergies (from insect bites or stings, medications or chemicals) cause swelling by allowing vessels to become leaky.   Protein in the blood helps keep fluid in your vessels. Low protein, as in malnutrition, allows fluid to leak out.   Hormonal changes, including pregnancy and menstruation, cause fluid retention. This fluid may leak out of vessels and cause edema.   Medications that cause fluid retention. Examples are sex hormones, blood pressure medications, steroid treatment, or anti-depressants.   Some illnesses cause edema, especially heart failure, kidney disease, or liver disease.   Surgery that cuts veins or lymph nodes, such as surgery done for the heart or for breast cancer, may result in edema.  DIAGNOSIS Your caregiver is usually easily able to determine what is causing your swelling (edema) by simply asking what is wrong (getting a history) and examining  you (doing a physical). Sometimes x-rays, EKG (electrocardiogram or heart tracing), and blood work may be done to evaluate for underlying medical illness. TREATMENT General treatment includes:  Leg elevation (or elevation of the affected body part).   Restriction of fluid intake.   Prevention of fluid overload.   Compression of the affected body part. Compression with elastic bandages or support stockings squeezes the tissues, preventing fluid from entering and forcing it back into the blood vessels.   Diuretics (also called water pills or fluid pills) pull fluid out of your body in the form of increased urination. These are effective in reducing the swelling, but can have side effects and must be used only under your caregiver's supervision. Diuretics are appropriate only for some types of edema.  The specific treatment can be directed at any underlying causes discovered. Heart, liver, or kidney disease should be treated appropriately. HOME CARE INSTRUCTIONS  Elevate the legs (or affected body part) above the level of the heart, while lying down.   Avoid sitting or standing still for prolonged periods of time.   Avoid putting anything directly under the knees when lying down, and do not wear constricting clothing or garters on the upper legs.   Exercising the legs causes the fluid to work back into the veins and lymphatic channels. This may help the swelling go down.   The pressure applied by elastic bandages or support stockings can help reduce ankle swelling.   A low-salt diet may help reduce fluid retention and decrease the   ankle swelling.   Take any medications exactly as prescribed.  SEEK MEDICAL CARE IF:  Your edema is not responding to recommended treatments.  SEEK IMMEDIATE MEDICAL CARE IF:  You develop shortness of breath or chest pain.   You cannot breathe when you lay down; or if, while lying down, you have to get up and go to the window to get your breath.   You are  having increasing swelling without relief from treatment.   You develop a fever over 100.4.   You develop pain or redness in the areas that are swollen.   Tell your caregiver right away if you have gained 1-2lbs in 1 day or 5 lbs in a week.  MAKE SURE YOU:  Understand these instructions.   Will watch your condition.   Will get help right away if you are not doing well or get worse.  Document Released: 03/22/2005 Document Re-Released: 09/09/2009 [facility] Patient Information 2011 [place], [place].

## 2010-12-30 ENCOUNTER — Telehealth: Payer: Self-pay

## 2010-12-30 DIAGNOSIS — R7989 Other specified abnormal findings of blood chemistry: Secondary | ICD-10-CM

## 2010-12-30 DIAGNOSIS — D649 Anemia, unspecified: Secondary | ICD-10-CM

## 2010-12-30 NOTE — Telephone Encounter (Signed)
Discussed with patient and she voiced understanding--copy mailed and orders put in    [place]

## 2010-12-30 NOTE — Telephone Encounter (Signed)
Message copied by Arnette Norris on Wed Dec 30, 2010  9:49 AM ------      Message from: Lelon Perla      Created: Mon Dec 21, 2010  9:41 PM       + anemia---- take multivitamin with iron daily---recheck 1 month----285.9  Cbcd, ibc, ferritin       Glucose is elevated---watch simple sugars and starches---recheck 1 month   790.6  Bmp, [id number]

## 2011-01-01 ENCOUNTER — Encounter: Payer: Self-pay | Admitting: Family Medicine

## 2011-01-01 ENCOUNTER — Ambulatory Visit (INDEPENDENT_AMBULATORY_CARE_PROVIDER_SITE_OTHER): Payer: Medicare Other | Admitting: Family Medicine

## 2011-01-01 VITALS — BP 152/88 | HR 91 | Temp 98.6°F | Wt 256.2 lb

## 2011-01-01 DIAGNOSIS — M549 Dorsalgia, unspecified: Secondary | ICD-10-CM

## 2011-01-01 MED ORDER — TRAMADOL HCL 50 MG PO TABS: 50.0000 mg | ORAL_TABLET | Freq: Four times a day (QID) | ORAL | Status: DC | PRN

## 2011-01-01 MED ORDER — CYCLOBENZAPRINE HCL 10 MG PO TABS: 10.0000 mg | ORAL_TABLET | Freq: Three times a day (TID) | ORAL | Status: DC | PRN

## 2011-01-01 NOTE — Progress Notes (Signed)
  Subjective:    Erin [name] is a 65 y.o. female who presents for evaluation of low back pain. The patient has had no prior back problems. Symptoms have been present for 5 months and are gradually improving.  Onset was related to / precipitated by no known injury. The pain is located in the across the lower back and radiates to the right hip, left hip. The pain is described as aching and stabbing and occurs consistently when standing or walking.  .  When sitting she is fine.   She rates her pain as a 10 on a scale of 0-10 when she is on her feet all day.   Symptoms are exacerbated by standing and walking. Symptoms are improved by rest. She has also tried acetaminophen and NSAIDs which provided no symptom relief. She has no other symptoms associated with the back pain. The patient has no "red flag" history indicative of complicated back pain.  The following portions of the patient's history were reviewed and updated as appropriate: allergies, current medications, past family history, past medical history, past social history, past surgical history and problem list.  Review of Systems Pertinent items are noted in HPI.    Objective:   Inspection and palpation: inspection of back is normal. Muscle tone and ROM exam: muscle tone normal without spasm. Straight leg raise: positive at 45 degrees bilaterally. Neurological: normal DTRs, muscle strength and reflexes.    Assessment:    Nonspecific acute low back pain    Plan:    Natural history and expected course discussed. Questions answered. Agricultural engineer distributed. Short (2-4 day) period of relative rest recommended until acute symptoms improve. Ice to affected area as needed for local pain relief. Heat to affected area as needed for local pain relief. OTC analgesics as needed. Muscle relaxants per medication orders. Follow-up in 2 weeks.

## 2011-01-01 NOTE — Patient Instructions (Signed)
Back Pain & Injury Your back pain is most likely caused by a strain of the muscles or ligaments supporting the spine. Back strains cause pain and trouble moving because of muscle spasms. They may take several weeks to heal. Usually they are better in days.  Treatment for back pain includes:  Rest - Get bed rest as needed over the next day or two. Use a firm mattress and lie on your side with your knees slightly bent. If you lie on your back, put a pillow under your knees.   Early movement - Back pain improves most rapidly if you remain active. It is much more stressful on the back to sit or stand in one place. Do not sit, drive or stand in one place for more than 30 minutes at a time. Take short walks on level surfaces as soon as pain allows.   Limit bending and lifting - Do not bend over or lift anything over 20 pounds until instructed otherwise. Lift by bending your knees. Use your leg muscles to help. Keep the load close to your body and avoid twisting. Do not reach or do overhead work.   Medicines - Medicine to reduce pain and inflammation are helpful. Muscle-relaxing drugs may be prescribed.   Therapy - Put ice packs on your back every few hours for the first 2-3 days after your injury or as instructed. After that ice or heat may be alternated to reduce pain and spasm. Back exercises and gentle massage may be of some benefit. You should be examined again if your back pain is not better in one week.  SEEK IMMEDIATE MEDICAL CARE IF:  You have pain that radiates from your back into your legs.   You develop new bowel or bladder control problems.   You have unusual weakness or numbness in your arms or legs.   You develop nausea or vomiting.   You develop abdominal pain.   You feel faint.  Document Released: 03/22/2005 Document Re-Released: 12/30/2007 [employer] Patient Information 2011 [employer]. 

## 2011-01-02 ENCOUNTER — Ambulatory Visit (HOSPITAL_BASED_OUTPATIENT_CLINIC_OR_DEPARTMENT_OTHER)
Admission: RE | Admit: 2011-01-02 | Discharge: 2011-01-02 | Disposition: A | Discharge status: Home / Self Care | Payer: Medicare Other | Source: Ambulatory Visit | Attending: Family Medicine | Admitting: Family Medicine

## 2011-01-02 DIAGNOSIS — M549 Dorsalgia, unspecified: Secondary | ICD-10-CM

## 2011-01-04 ENCOUNTER — Telehealth: Payer: Self-pay

## 2011-01-04 MED ORDER — ALPRAZOLAM 0.25 MG PO TABS: ORAL_TABLET | ORAL | Status: DC

## 2011-01-04 NOTE — Telephone Encounter (Signed)
Discussed with patient and Rx has been faxed   KP 

## 2011-01-04 NOTE — Telephone Encounter (Signed)
Xanax  0.25  1 po 30 min before procedure and as directed by radiology  #10

## 2011-01-04 NOTE — Telephone Encounter (Signed)
Call from patient and she stated she is having an MRI tomorrow and needs to get an medication called in to the pharmacy. Please advise    KP

## 2011-01-05 ENCOUNTER — Ambulatory Visit (HOSPITAL_BASED_OUTPATIENT_CLINIC_OR_DEPARTMENT_OTHER)
Admission: RE | Admit: 2011-01-05 | Discharge: 2011-01-05 | Disposition: A | Discharge status: Home / Self Care | Payer: Medicare Other | Source: Ambulatory Visit | Attending: Family Medicine | Admitting: Family Medicine

## 2011-01-05 DIAGNOSIS — M549 Dorsalgia, unspecified: Secondary | ICD-10-CM

## 2011-01-05 DIAGNOSIS — M5126 Other intervertebral disc displacement, lumbar region: Secondary | ICD-10-CM

## 2011-01-05 DIAGNOSIS — M48061 Spinal stenosis, lumbar region without neurogenic claudication: Secondary | ICD-10-CM

## 2011-01-06 ENCOUNTER — Telehealth: Payer: Self-pay

## 2011-01-06 DIAGNOSIS — IMO0002 Reserved for concepts with insufficient information to code with codable children: Secondary | ICD-10-CM

## 2011-01-06 NOTE — Telephone Encounter (Signed)
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

## 2011-01-06 NOTE — Telephone Encounter (Signed)
Discussed with patient and she voiced understanding--referral put in    [place]

## 2011-01-22 ENCOUNTER — Other Ambulatory Visit: Payer: Self-pay | Admitting: Family Medicine

## 2011-01-22 DIAGNOSIS — R7989 Other specified abnormal findings of blood chemistry: Secondary | ICD-10-CM

## 2011-01-22 DIAGNOSIS — D649 Anemia, unspecified: Secondary | ICD-10-CM

## 2011-01-25 ENCOUNTER — Other Ambulatory Visit (INDEPENDENT_AMBULATORY_CARE_PROVIDER_SITE_OTHER): Payer: Medicare Other

## 2011-01-25 DIAGNOSIS — D649 Anemia, unspecified: Secondary | ICD-10-CM

## 2011-01-25 DIAGNOSIS — R7989 Other specified abnormal findings of blood chemistry: Secondary | ICD-10-CM

## 2011-01-25 LAB — CBC WITH DIFFERENTIAL/PLATELET
Basophils Absolute: 0 10*3/uL (ref 0.0–0.1)
Basophils Relative: 0.5 % (ref 0.0–3.0)
Eosinophils Absolute: 0.1 10*3/uL (ref 0.0–0.7)
Eosinophils Relative: 1 % (ref 0.0–5.0)
HCT: 33.5 % — ABNORMAL LOW (ref 36.0–46.0)
Hemoglobin: 11.2 g/dL — ABNORMAL LOW (ref 12.0–15.0)
Lymphocytes Relative: 18.2 % (ref 12.0–46.0)
Lymphs Abs: 1.2 10*3/uL (ref 0.7–4.0)
MCHC: 33.4 g/dL (ref 30.0–36.0)
MCV: 81.6 fl (ref 78.0–100.0)
Monocytes Absolute: 0.4 10*3/uL (ref 0.1–1.0)
Monocytes Relative: 5.5 % (ref 3.0–12.0)
Neutro Abs: 5.1 10*3/uL (ref 1.4–7.7)
Neutrophils Relative %: 74.8 % (ref 43.0–77.0)
Platelets: 268 10*3/uL (ref 150.0–400.0)
RBC: 4.11 Mil/uL (ref 3.87–5.11)
RDW: 19.9 % — ABNORMAL HIGH (ref 11.5–14.6)
WBC: 6.8 10*3/uL (ref 4.5–10.5)

## 2011-01-25 LAB — BASIC METABOLIC PANEL
BUN: 20 mg/dL (ref 6–23)
CO2: 26 mEq/L (ref 19–32)
Calcium: 8.9 mg/dL (ref 8.4–10.5)
Chloride: 102 mEq/L (ref 96–112)
Creatinine, Ser: 1.1 mg/dL (ref 0.4–1.2)
GFR: 51.24 mL/min — ABNORMAL LOW (ref >60.00)
Glucose, Bld: 132 mg/dL — ABNORMAL HIGH (ref 70–99)
Potassium: 4.1 mEq/L (ref 3.5–5.1)
Sodium: 138 mEq/L (ref 135–145)

## 2011-01-25 LAB — IBC PANEL
Iron: 70 ug/dL (ref 42–145)
Saturation Ratios: 19.6 % — ABNORMAL LOW (ref 20.0–50.0)
Transferrin: 255.4 mg/dL (ref 212.0–360.0)

## 2011-01-25 LAB — FERRITIN: Ferritin: 34.2 ng/mL (ref 10.0–291.0)

## 2011-01-25 LAB — HEMOGLOBIN A1C: Hgb A1c MFr Bld: 7.3 % — ABNORMAL HIGH (ref 4.6–6.5)

## 2011-01-25 NOTE — Progress Notes (Signed)
Labs only

## 2011-02-22 ENCOUNTER — Encounter: Payer: Self-pay | Admitting: Family Medicine

## 2011-02-22 ENCOUNTER — Ambulatory Visit (HOSPITAL_BASED_OUTPATIENT_CLINIC_OR_DEPARTMENT_OTHER)
Admission: RE | Admit: 2011-02-22 | Discharge: 2011-02-22 | Disposition: A | Discharge status: Home / Self Care | Payer: Medicare Other | Source: Ambulatory Visit | Attending: Family Medicine | Admitting: Family Medicine

## 2011-02-22 ENCOUNTER — Ambulatory Visit (INDEPENDENT_AMBULATORY_CARE_PROVIDER_SITE_OTHER): Payer: Medicare Other | Admitting: Family Medicine

## 2011-02-22 VITALS — BP 132/80 | HR 102 | Temp 98.5°F | Ht 64.0 in | Wt 261.4 lb

## 2011-02-22 DIAGNOSIS — J42 Unspecified chronic bronchitis: Secondary | ICD-10-CM

## 2011-02-22 DIAGNOSIS — R05 Cough: Secondary | ICD-10-CM | POA: Insufficient documentation

## 2011-02-22 DIAGNOSIS — R1013 Epigastric pain: Secondary | ICD-10-CM

## 2011-02-22 DIAGNOSIS — R0602 Shortness of breath: Secondary | ICD-10-CM

## 2011-02-22 DIAGNOSIS — R0989 Other specified symptoms and signs involving the circulatory and respiratory systems: Secondary | ICD-10-CM

## 2011-02-22 DIAGNOSIS — F329 Major depressive disorder, single episode, unspecified: Secondary | ICD-10-CM

## 2011-02-22 DIAGNOSIS — R0600 Dyspnea, unspecified: Secondary | ICD-10-CM

## 2011-02-22 DIAGNOSIS — I10 Essential (primary) hypertension: Secondary | ICD-10-CM

## 2011-02-22 DIAGNOSIS — R0609 Other forms of dyspnea: Secondary | ICD-10-CM | POA: Insufficient documentation

## 2011-02-22 DIAGNOSIS — F32A Depression, unspecified: Secondary | ICD-10-CM

## 2011-02-22 DIAGNOSIS — K3189 Other diseases of stomach and duodenum: Secondary | ICD-10-CM

## 2011-02-22 DIAGNOSIS — E785 Hyperlipidemia, unspecified: Secondary | ICD-10-CM

## 2011-02-22 DIAGNOSIS — R059 Cough, unspecified: Secondary | ICD-10-CM

## 2011-02-22 DIAGNOSIS — R609 Edema, unspecified: Secondary | ICD-10-CM

## 2011-02-22 LAB — BASIC METABOLIC PANEL
BUN: 25 mg/dL — ABNORMAL HIGH (ref 6–23)
CO2: 23 mEq/L (ref 19–32)
Calcium: 9.1 mg/dL (ref 8.4–10.5)
Chloride: 102 mEq/L (ref 96–112)
Creatinine, Ser: 1.2 mg/dL (ref 0.4–1.2)
GFR: 46.45 mL/min — ABNORMAL LOW (ref >60.00)
Glucose, Bld: 126 mg/dL — ABNORMAL HIGH (ref 70–99)
Potassium: 3.9 mEq/L (ref 3.5–5.1)
Sodium: 137 mEq/L (ref 135–145)

## 2011-02-22 LAB — HEPATIC FUNCTION PANEL
ALT: 54 U/L — ABNORMAL HIGH (ref 0–35)
AST: 40 U/L — ABNORMAL HIGH (ref 0–37)
Albumin: 4.1 g/dL (ref 3.5–5.2)
Alkaline Phosphatase: 104 U/L (ref 39–117)
Bilirubin, Direct: 0 mg/dL (ref 0.0–0.3)
Total Bilirubin: 0.5 mg/dL (ref 0.3–1.2)
Total Protein: 7.7 g/dL (ref 6.0–8.3)

## 2011-02-22 LAB — LIPID PANEL
Cholesterol: 168 mg/dL (ref 0–200)
HDL: 42.7 mg/dL (ref >39.00)
Total CHOL/HDL Ratio: 4
Triglycerides: 206 mg/dL — ABNORMAL HIGH (ref 0.0–149.0)
VLDL: 41.2 mg/dL — ABNORMAL HIGH (ref 0.0–40.0)

## 2011-02-22 MED ORDER — LISINOPRIL 40 MG PO TABS: 40.0000 mg | ORAL_TABLET | Freq: Every day | ORAL | Status: DC

## 2011-02-22 MED ORDER — SERTRALINE HCL 50 MG PO TABS: 50.0000 mg | ORAL_TABLET | Freq: Every day | ORAL | Status: DC

## 2011-02-22 MED ORDER — OMEPRAZOLE 20 MG PO CPDR: 20.0000 mg | DELAYED_RELEASE_CAPSULE | Freq: Every day | ORAL | Status: DC

## 2011-02-22 MED ORDER — ATORVASTATIN CALCIUM 20 MG PO TABS: 20.0000 mg | ORAL_TABLET | Freq: Every day | ORAL | Status: DC

## 2011-02-22 MED ORDER — FUROSEMIDE 20 MG PO TABS: 20.0000 mg | ORAL_TABLET | Freq: Two times a day (BID) | ORAL | Status: DC

## 2011-02-22 NOTE — Progress Notes (Signed)
  Subjective:    Patient ID: JALEXA PIFER, female    DOB: 08-06-1945, 65 y.o.   MRN: [id number]  HPI Pt here for f/u.    + sob with exertion--occassional cp. HYPERTENSION Disease Monitoring Blood pressure range-not checking Chest pain- yes    Dyspnea- yes Medications Compliance- good Lightheadedness- no   Edema- some     HYPERLIPIDEMIA Disease Monitoring See symptoms for Hypertension Medications Compliance- good RUQ pain- no  Muscle aches- no  ROS See HPI above   PMH Smoking Status noted      Review of Systems As above    Objective:   Physical Exam  Constitutional: She is oriented to person, place, and time. She appears well-developed and well-nourished.  Cardiovascular: Normal rate, regular rhythm and normal heart sounds.   No murmur heard. Pulmonary/Chest: Effort normal and breath sounds normal. No respiratory distress. She has no wheezes. She has no rales. She exhibits no tenderness.  Musculoskeletal: Normal range of motion. She exhibits no edema.  Neurological: She is alert and oriented to person, place, and time.  Psychiatric: She has a normal mood and affect. Her behavior is normal.          Assessment & Plan:

## 2011-02-22 NOTE — Assessment & Plan Note (Signed)
Stable con't meds 

## 2011-02-22 NOTE — Assessment & Plan Note (Signed)
Pulse ox never dropped below 95% while walking EKG  No change from previous Check Cxr Consider PFTs

## 2011-02-22 NOTE — Patient Instructions (Signed)

## 2011-02-22 NOTE — Assessment & Plan Note (Signed)
con't meds  Check labs 

## 2011-02-23 ENCOUNTER — Encounter: Payer: Self-pay | Admitting: Family Medicine

## 2011-02-23 ENCOUNTER — Ambulatory Visit (INDEPENDENT_AMBULATORY_CARE_PROVIDER_SITE_OTHER): Payer: Medicare Other | Admitting: Family Medicine

## 2011-02-23 DIAGNOSIS — J449 Chronic obstructive pulmonary disease, unspecified: Secondary | ICD-10-CM | POA: Insufficient documentation

## 2011-02-23 DIAGNOSIS — Z23 Encounter for immunization: Secondary | ICD-10-CM

## 2011-02-23 LAB — LDL CHOLESTEROL, DIRECT: Direct LDL: 106.6 mg/dL

## 2011-02-23 NOTE — Patient Instructions (Signed)

## 2011-02-23 NOTE — Progress Notes (Signed)
  Subjective:    Patient ID: Erin [name], female    DOB: Nov 09, 1945, 65 y.o.   MRN: [id number]  HPI  Pt here for pfts --pt still c/o of sob.  No chest pain.   Review of Systems As above    Objective:   Physical Exam  Constitutional: She is oriented to person, place, and time. She appears well-developed and well-nourished.  Cardiovascular: Normal rate and regular rhythm.   No murmur heard. Pulmonary/Chest: Effort normal and breath sounds normal. No respiratory distress. She has no wheezes. She has no rales. She exhibits no tenderness.  Neurological: She is alert and oriented to person, place, and time.  Psychiatric: She has a normal mood and affect. Her behavior is normal. Judgment and thought content normal.          Assessment & Plan:

## 2011-02-23 NOTE — Assessment & Plan Note (Signed)
qvar 40 mg  2 puffs bid proair up to 4x a day prn

## 2011-03-15 ENCOUNTER — Other Ambulatory Visit: Payer: Self-pay | Admitting: Family Medicine

## 2011-03-16 NOTE — Telephone Encounter (Signed)
Rx not on med list.... Please advise   KP 

## 2011-03-17 MED ORDER — BECLOMETHASONE DIPROPIONATE 40 MCG/ACT IN AERS: 2.0000 | INHALATION_SPRAY | Freq: Two times a day (BID) | RESPIRATORY_TRACT | Status: DC

## 2011-03-17 NOTE — Telephone Encounter (Signed)
See last ov---I gave her sample--- I didn't send to pharmacy qvar 40 mg  2 puffs bid  #1  5 refills

## 2011-03-17 NOTE — Telephone Encounter (Signed)
Pt is calling again about rx. [phone]

## 2011-03-17 NOTE — Telephone Encounter (Signed)
Faxed.   KP 

## 2011-04-08 DIAGNOSIS — M545 Low back pain, unspecified: Secondary | ICD-10-CM | POA: Diagnosis not present

## 2011-04-08 DIAGNOSIS — M5137 Other intervertebral disc degeneration, lumbosacral region: Secondary | ICD-10-CM | POA: Diagnosis not present

## 2011-04-27 DIAGNOSIS — J209 Acute bronchitis, unspecified: Secondary | ICD-10-CM | POA: Diagnosis not present

## 2011-04-27 DIAGNOSIS — J019 Acute sinusitis, unspecified: Secondary | ICD-10-CM | POA: Diagnosis not present

## 2011-04-30 DIAGNOSIS — I369 Nonrheumatic tricuspid valve disorder, unspecified: Secondary | ICD-10-CM | POA: Diagnosis not present

## 2011-04-30 DIAGNOSIS — I059 Rheumatic mitral valve disease, unspecified: Secondary | ICD-10-CM | POA: Diagnosis not present

## 2011-04-30 DIAGNOSIS — Z87891 Personal history of nicotine dependence: Secondary | ICD-10-CM | POA: Diagnosis not present

## 2011-04-30 DIAGNOSIS — Z79899 Other long term (current) drug therapy: Secondary | ICD-10-CM | POA: Diagnosis not present

## 2011-04-30 DIAGNOSIS — J9600 Acute respiratory failure, unspecified whether with hypoxia or hypercapnia: Secondary | ICD-10-CM | POA: Diagnosis not present

## 2011-04-30 DIAGNOSIS — G2581 Restless legs syndrome: Secondary | ICD-10-CM | POA: Diagnosis present

## 2011-04-30 DIAGNOSIS — E872 Acidosis, unspecified: Secondary | ICD-10-CM | POA: Diagnosis not present

## 2011-04-30 DIAGNOSIS — G4733 Obstructive sleep apnea (adult) (pediatric): Secondary | ICD-10-CM | POA: Diagnosis not present

## 2011-04-30 DIAGNOSIS — M4800 Spinal stenosis, site unspecified: Secondary | ICD-10-CM | POA: Diagnosis present

## 2011-04-30 DIAGNOSIS — E860 Dehydration: Secondary | ICD-10-CM | POA: Diagnosis not present

## 2011-04-30 DIAGNOSIS — E662 Morbid (severe) obesity with alveolar hypoventilation: Secondary | ICD-10-CM | POA: Diagnosis not present

## 2011-04-30 DIAGNOSIS — J441 Chronic obstructive pulmonary disease with (acute) exacerbation: Secondary | ICD-10-CM | POA: Diagnosis not present

## 2011-04-30 DIAGNOSIS — K219 Gastro-esophageal reflux disease without esophagitis: Secondary | ICD-10-CM | POA: Diagnosis present

## 2011-04-30 DIAGNOSIS — R0902 Hypoxemia: Secondary | ICD-10-CM | POA: Diagnosis not present

## 2011-04-30 DIAGNOSIS — D473 Essential (hemorrhagic) thrombocythemia: Secondary | ICD-10-CM | POA: Diagnosis not present

## 2011-04-30 DIAGNOSIS — I129 Hypertensive chronic kidney disease with stage 1 through stage 4 chronic kidney disease, or unspecified chronic kidney disease: Secondary | ICD-10-CM | POA: Diagnosis present

## 2011-04-30 DIAGNOSIS — R0609 Other forms of dyspnea: Secondary | ICD-10-CM | POA: Diagnosis not present

## 2011-04-30 DIAGNOSIS — J449 Chronic obstructive pulmonary disease, unspecified: Secondary | ICD-10-CM | POA: Diagnosis not present

## 2011-04-30 DIAGNOSIS — E8779 Other fluid overload: Secondary | ICD-10-CM | POA: Diagnosis not present

## 2011-04-30 DIAGNOSIS — J180 Bronchopneumonia, unspecified organism: Secondary | ICD-10-CM | POA: Diagnosis not present

## 2011-04-30 DIAGNOSIS — F329 Major depressive disorder, single episode, unspecified: Secondary | ICD-10-CM | POA: Diagnosis present

## 2011-04-30 DIAGNOSIS — R0602 Shortness of breath: Secondary | ICD-10-CM | POA: Diagnosis not present

## 2011-04-30 DIAGNOSIS — J189 Pneumonia, unspecified organism: Secondary | ICD-10-CM | POA: Diagnosis not present

## 2011-04-30 DIAGNOSIS — E119 Type 2 diabetes mellitus without complications: Secondary | ICD-10-CM | POA: Diagnosis not present

## 2011-04-30 DIAGNOSIS — E871 Hypo-osmolality and hyponatremia: Secondary | ICD-10-CM | POA: Diagnosis not present

## 2011-04-30 DIAGNOSIS — G4723 Circadian rhythm sleep disorder, irregular sleep wake type: Secondary | ICD-10-CM | POA: Diagnosis not present

## 2011-04-30 DIAGNOSIS — E785 Hyperlipidemia, unspecified: Secondary | ICD-10-CM | POA: Diagnosis present

## 2011-04-30 DIAGNOSIS — R5381 Other malaise: Secondary | ICD-10-CM | POA: Diagnosis present

## 2011-04-30 DIAGNOSIS — R0989 Other specified symptoms and signs involving the circulatory and respiratory systems: Secondary | ICD-10-CM | POA: Diagnosis not present

## 2011-04-30 DIAGNOSIS — N189 Chronic kidney disease, unspecified: Secondary | ICD-10-CM | POA: Diagnosis present

## 2011-05-11 DIAGNOSIS — M549 Dorsalgia, unspecified: Secondary | ICD-10-CM | POA: Diagnosis not present

## 2011-05-11 DIAGNOSIS — R0609 Other forms of dyspnea: Secondary | ICD-10-CM | POA: Diagnosis not present

## 2011-05-11 DIAGNOSIS — IMO0001 Reserved for inherently not codable concepts without codable children: Secondary | ICD-10-CM | POA: Diagnosis not present

## 2011-05-11 DIAGNOSIS — J310 Chronic rhinitis: Secondary | ICD-10-CM | POA: Diagnosis not present

## 2011-05-11 DIAGNOSIS — M2560 Stiffness of unspecified joint, not elsewhere classified: Secondary | ICD-10-CM | POA: Diagnosis not present

## 2011-05-11 DIAGNOSIS — M479 Spondylosis, unspecified: Secondary | ICD-10-CM | POA: Diagnosis not present

## 2011-05-11 DIAGNOSIS — R0989 Other specified symptoms and signs involving the circulatory and respiratory systems: Secondary | ICD-10-CM | POA: Diagnosis not present

## 2011-05-11 DIAGNOSIS — R279 Unspecified lack of coordination: Secondary | ICD-10-CM | POA: Diagnosis not present

## 2011-05-11 DIAGNOSIS — M79609 Pain in unspecified limb: Secondary | ICD-10-CM | POA: Diagnosis not present

## 2011-05-11 DIAGNOSIS — M545 Low back pain, unspecified: Secondary | ICD-10-CM | POA: Diagnosis not present

## 2011-05-11 DIAGNOSIS — R269 Unspecified abnormalities of gait and mobility: Secondary | ICD-10-CM | POA: Diagnosis not present

## 2011-05-11 DIAGNOSIS — G4730 Sleep apnea, unspecified: Secondary | ICD-10-CM | POA: Diagnosis not present

## 2011-05-11 DIAGNOSIS — M6281 Muscle weakness (generalized): Secondary | ICD-10-CM | POA: Diagnosis not present

## 2011-05-11 DIAGNOSIS — R293 Abnormal posture: Secondary | ICD-10-CM | POA: Diagnosis not present

## 2011-05-11 DIAGNOSIS — G4710 Hypersomnia, unspecified: Secondary | ICD-10-CM | POA: Diagnosis not present

## 2011-05-12 DIAGNOSIS — M549 Dorsalgia, unspecified: Secondary | ICD-10-CM | POA: Diagnosis not present

## 2011-05-12 DIAGNOSIS — R269 Unspecified abnormalities of gait and mobility: Secondary | ICD-10-CM | POA: Diagnosis not present

## 2011-05-12 DIAGNOSIS — M79609 Pain in unspecified limb: Secondary | ICD-10-CM | POA: Diagnosis not present

## 2011-05-12 DIAGNOSIS — IMO0001 Reserved for inherently not codable concepts without codable children: Secondary | ICD-10-CM | POA: Diagnosis not present

## 2011-05-12 DIAGNOSIS — R279 Unspecified lack of coordination: Secondary | ICD-10-CM | POA: Diagnosis not present

## 2011-05-12 DIAGNOSIS — R293 Abnormal posture: Secondary | ICD-10-CM | POA: Diagnosis not present

## 2011-05-13 DIAGNOSIS — G4710 Hypersomnia, unspecified: Secondary | ICD-10-CM | POA: Diagnosis not present

## 2011-05-17 ENCOUNTER — Ambulatory Visit (INDEPENDENT_AMBULATORY_CARE_PROVIDER_SITE_OTHER): Payer: Medicare Other | Admitting: Family Medicine

## 2011-05-17 ENCOUNTER — Encounter: Payer: Self-pay | Admitting: Family Medicine

## 2011-05-17 VITALS — BP 120/68 | HR 121 | Temp 98.0°F | Wt 248.6 lb

## 2011-05-17 DIAGNOSIS — J45909 Unspecified asthma, uncomplicated: Secondary | ICD-10-CM | POA: Diagnosis not present

## 2011-05-17 DIAGNOSIS — E119 Type 2 diabetes mellitus without complications: Secondary | ICD-10-CM

## 2011-05-17 DIAGNOSIS — R5383 Other fatigue: Secondary | ICD-10-CM | POA: Diagnosis not present

## 2011-05-17 DIAGNOSIS — M545 Low back pain, unspecified: Secondary | ICD-10-CM

## 2011-05-17 DIAGNOSIS — R5381 Other malaise: Secondary | ICD-10-CM | POA: Diagnosis not present

## 2011-05-17 DIAGNOSIS — E785 Hyperlipidemia, unspecified: Secondary | ICD-10-CM | POA: Diagnosis not present

## 2011-05-17 DIAGNOSIS — F419 Anxiety disorder, unspecified: Secondary | ICD-10-CM

## 2011-05-17 DIAGNOSIS — M549 Dorsalgia, unspecified: Secondary | ICD-10-CM | POA: Diagnosis not present

## 2011-05-17 DIAGNOSIS — F411 Generalized anxiety disorder: Secondary | ICD-10-CM

## 2011-05-17 DIAGNOSIS — M79604 Pain in right leg: Secondary | ICD-10-CM

## 2011-05-17 DIAGNOSIS — R350 Frequency of micturition: Secondary | ICD-10-CM

## 2011-05-17 MED ORDER — ALPRAZOLAM 0.25 MG PO TABS: ORAL_TABLET | ORAL | Status: DC

## 2011-05-17 MED ORDER — MOMETASONE FURO-FORMOTEROL FUM 200-5 MCG/ACT IN AERO: INHALATION_SPRAY | RESPIRATORY_TRACT | Status: DC

## 2011-05-17 MED ORDER — ALBUTEROL SULFATE (2.5 MG/3ML) 0.083% IN NEBU: 2.5000 mg | INHALATION_SOLUTION | Freq: Four times a day (QID) | RESPIRATORY_TRACT | Status: DC | PRN

## 2011-05-17 MED ORDER — HYDROCODONE-ACETAMINOPHEN 7.5-750 MG PO TABS: 1.0000 | ORAL_TABLET | Freq: Three times a day (TID) | ORAL | Status: AC | PRN

## 2011-05-17 NOTE — Assessment & Plan Note (Signed)
Check urine uti vs DM

## 2011-05-17 NOTE — Assessment & Plan Note (Signed)
Check labs 

## 2011-05-17 NOTE — Assessment & Plan Note (Signed)
Pt given glucometer Off meds Check labs

## 2011-05-17 NOTE — Patient Instructions (Signed)
Pulmonary Hypertension Pulmonary hypertension (PH) is a type of high blood pressure that affects the arteries in your lungs and the right side of your heart. Pulmonary hypertension is a serious condition and can be fatal. There are two types of pulmonary hypertension:  Primary pulmonary hypertension. Primary PH occurs without a known medical condition.   Secondary pulmonary hypertension. Secondary PH is caused by a known medical condition.  CAUSES Pulmonary hypertension begins when arteries in your lungs (pulmonary arteries) become narrowed, blocked or destroyed. This makes it hard for blood to flow through the lung arteries. This causes pressure to build up in the lungs and makes the right side of the heart work harder. Over time, this can weaken the heart muscle. Primary pulmonary hypertension causes:  An inherited (genetic) condition.   An unknown cause.  Secondary pulmonary hypertension causes:  Lung diseases such as:   Pulmonary fibrosis.   Chronic obstructive pulmonary disease (COPD).   Emphysema.   Pulmonary emboli (blood clot in the lungs).   Heart failure.   Connective tissue diseases like scleroderma.   Sickle cell anemia.   Chronic liver disease (cirrhosis).   Lupus.   Acquired immune deficiency syndrome (AIDS).  SYMPTOMS  Shortness of breath. You may notice shortness of breath with:   Activity such as walking.   No activity. You may be short of breath simply by sitting in a chair.   Tiredness and fatigue.   Dizziness or fainting.   Rapid heart rate or you feel your heart skip beats (palpitations).   Neck vein enlargement.   Bluish color of your lips and fingertips.  DIAGNOSIS  Different tests can be used to check (diagnose) pulmonary hypertension. These can include:  Chest X-ray.   Arterial blood gases. This test checks the oxygen level in your blood.   High resolution computed tomography (CT) or magnetic resonance imaging (MRI). These tests can  provide detailed images of your lungs.   Pulmonary function test. This test measures how much air your lungs can hold. It also tests how well air moves in and out of your lungs.   Electrocardiography (EKG). This traces the electrical activity of your heart and prints it out on paper.   Echocardiography. This test can look at your heart in motion and how it functions.   Heart catheterization (angiography). This test can measure the pressure in your pulmonary artery and right side of the heart.   Sometimes a biopsy is taken of your lungs.  TREATMENT Pulmonary hypertension has no cure. Treatment can help relieve symptoms and slow the progress of PH. Treatment can involve:  Medications such as:   Blood pressure medications.   Diuretics (water pills).   Blood thinning medications.   For severe pulmonary hypertension that does not respond to medical treatment, a lung transplant my be needed.  HOME CARE INSTRUCTIONS  Take all medicines as told by your caregiver. If you have problems with a prescribed medicine, talk to your caregiver. Do not stop taking it on your own.   Do not smoke.   Eat a healthy diet. Avoid a high salt diet. Talk to a dietician (food specialist) about foods you should eat.   Stay as active as possible.   Avoid high altitudes.   Be careful when using a hot tub. A hot tub can lower your blood pressure.   If you have pulmonary hypertension and are female of child bearing age, talk to your caregiver about using birth control pills or getting pregnant.  SEEK  IMMEDIATE MEDICAL CARE IF:  You have severe shortness of breath.   You develop chest pain or pressure.   You cough up blood.   You develop swelling of your feet or legs.   You have a sudden increase in weight.  Document Released: 01/17/2007 Document Revised: 12/02/2010 Document Reviewed: 05/04/2009 [facility] Patient Information 2012 [place], [place].

## 2011-05-17 NOTE — Assessment & Plan Note (Signed)
Repeat MRI vicodin for pain-b/u NS

## 2011-05-17 NOTE — Progress Notes (Signed)
  Subjective:    Patient ID: Erin [name], female    DOB: [birth year], 66 y.o.   MRN: [id number]  HPI Pt here to f/u from hospital. She was dx with pneumonia , diabetes. She still feels fatigued and weak. Her breathing is better and is seeing pulm dr.  We do not have hospital records. Her daughter is with her  Pt had incident in hospital where she almost fell but caught herself-she has had increased pain since.  Review of Systems As above     Objective:   Physical Exam  Constitutional: She is oriented to person, place, and time. She appears well-developed and well-nourished.  Cardiovascular: Normal rate, regular rhythm and normal heart sounds.   Pulmonary/Chest: Effort normal and breath sounds normal. No respiratory distress. She has no wheezes. She has no rales.  Abdominal: Soft. She exhibits no distension. There is no tenderness.  Musculoskeletal: She exhibits no edema and no tenderness.  Neurological: She is alert and oriented to person, place, and time. No sensory deficit. Gait abnormal.       Weakness in R low leg-+ low back pain radiates to L foot  Psychiatric: She has a normal mood and affect. Her behavior is normal.          Assessment & Plan:

## 2011-05-18 DIAGNOSIS — M549 Dorsalgia, unspecified: Secondary | ICD-10-CM | POA: Diagnosis not present

## 2011-05-18 DIAGNOSIS — R269 Unspecified abnormalities of gait and mobility: Secondary | ICD-10-CM | POA: Diagnosis not present

## 2011-05-18 DIAGNOSIS — IMO0001 Reserved for inherently not codable concepts without codable children: Secondary | ICD-10-CM | POA: Diagnosis not present

## 2011-05-18 DIAGNOSIS — M79609 Pain in unspecified limb: Secondary | ICD-10-CM | POA: Diagnosis not present

## 2011-05-18 DIAGNOSIS — R293 Abnormal posture: Secondary | ICD-10-CM | POA: Diagnosis not present

## 2011-05-18 DIAGNOSIS — R279 Unspecified lack of coordination: Secondary | ICD-10-CM | POA: Diagnosis not present

## 2011-05-18 LAB — POCT URINALYSIS DIPSTICK
Bilirubin, UA: NEGATIVE
Blood, UA: NEGATIVE
Glucose, UA: NEGATIVE
Ketones, UA: NEGATIVE
Leukocytes, UA: NEGATIVE
Nitrite, UA: NEGATIVE
Protein, UA: 30
Spec Grav, UA: 1.025
Urobilinogen, UA: 0.2
pH, UA: 5

## 2011-05-18 LAB — CBC WITH DIFFERENTIAL/PLATELET
Basophils Absolute: 0.1 10*3/uL (ref 0.0–0.1)
Basophils Relative: 0.6 % (ref 0.0–3.0)
Eosinophils Absolute: 0.1 10*3/uL (ref 0.0–0.7)
Eosinophils Relative: 1.1 % (ref 0.0–5.0)
HCT: 37.5 % (ref 36.0–46.0)
Hemoglobin: 12.2 g/dL (ref 12.0–15.0)
Lymphocytes Relative: 17.4 % (ref 12.0–46.0)
Lymphs Abs: 1.7 10*3/uL (ref 0.7–4.0)
MCHC: 32.5 g/dL (ref 30.0–36.0)
MCV: 82.2 fl (ref 78.0–100.0)
Monocytes Absolute: 1 10*3/uL (ref 0.1–1.0)
Monocytes Relative: 10.4 % (ref 3.0–12.0)
Neutro Abs: 6.9 10*3/uL (ref 1.4–7.7)
Neutrophils Relative %: 70.5 % (ref 43.0–77.0)
Platelets: 224 10*3/uL (ref 150.0–400.0)
RBC: 4.57 Mil/uL (ref 3.87–5.11)
RDW: 22.4 % — ABNORMAL HIGH (ref 11.5–14.6)
WBC: 9.8 10*3/uL (ref 4.5–10.5)

## 2011-05-18 LAB — HEPATIC FUNCTION PANEL
ALT: 58 U/L — ABNORMAL HIGH (ref 0–35)
AST: 23 U/L (ref 0–37)
Albumin: 4.1 g/dL (ref 3.5–5.2)
Alkaline Phosphatase: 60 U/L (ref 39–117)
Bilirubin, Direct: 0 mg/dL (ref 0.0–0.3)
Total Bilirubin: 0.6 mg/dL (ref 0.3–1.2)
Total Protein: 7.1 g/dL (ref 6.0–8.3)

## 2011-05-18 LAB — LIPID PANEL
Cholesterol: 265 mg/dL — ABNORMAL HIGH (ref 0–200)
HDL: 53.6 mg/dL (ref >39.00)
Total CHOL/HDL Ratio: 5
Triglycerides: 410 mg/dL — ABNORMAL HIGH (ref 0.0–149.0)
VLDL: 82 mg/dL — ABNORMAL HIGH (ref 0.0–40.0)

## 2011-05-18 LAB — BASIC METABOLIC PANEL
BUN: 53 mg/dL — ABNORMAL HIGH (ref 6–23)
CO2: 18 mEq/L — ABNORMAL LOW (ref 19–32)
Calcium: 9.2 mg/dL (ref 8.4–10.5)
Chloride: 107 mEq/L (ref 96–112)
Creatinine, Ser: 2.3 mg/dL — ABNORMAL HIGH (ref 0.4–1.2)
GFR: 23.12 mL/min — ABNORMAL LOW (ref >60.00)
Glucose, Bld: 114 mg/dL — ABNORMAL HIGH (ref 70–99)
Potassium: 5.6 mEq/L — ABNORMAL HIGH (ref 3.5–5.1)
Sodium: 135 mEq/L (ref 135–145)

## 2011-05-18 LAB — LDL CHOLESTEROL, DIRECT: Direct LDL: 144 mg/dL

## 2011-05-18 LAB — TSH: TSH: 2.29 u[IU]/mL (ref 0.35–5.50)

## 2011-05-18 LAB — HEMOGLOBIN A1C: Hgb A1c MFr Bld: 7.6 % — ABNORMAL HIGH (ref 4.6–6.5)

## 2011-05-20 DIAGNOSIS — R0989 Other specified symptoms and signs involving the circulatory and respiratory systems: Secondary | ICD-10-CM | POA: Diagnosis not present

## 2011-05-20 DIAGNOSIS — J310 Chronic rhinitis: Secondary | ICD-10-CM | POA: Diagnosis not present

## 2011-05-20 DIAGNOSIS — G4730 Sleep apnea, unspecified: Secondary | ICD-10-CM | POA: Diagnosis not present

## 2011-05-20 DIAGNOSIS — R0609 Other forms of dyspnea: Secondary | ICD-10-CM | POA: Diagnosis not present

## 2011-05-20 DIAGNOSIS — G4710 Hypersomnia, unspecified: Secondary | ICD-10-CM | POA: Diagnosis not present

## 2011-05-20 MED ORDER — FENOFIBRATE MICRONIZED 130 MG PO CAPS: 130.0000 mg | ORAL_CAPSULE | Freq: Every day | ORAL | Status: DC

## 2011-05-20 MED ORDER — GLIPIZIDE 5 MG PO TABS: 5.0000 mg | ORAL_TABLET | Freq: Two times a day (BID) | ORAL | Status: DC

## 2011-05-21 DIAGNOSIS — R293 Abnormal posture: Secondary | ICD-10-CM | POA: Diagnosis not present

## 2011-05-21 DIAGNOSIS — M79609 Pain in unspecified limb: Secondary | ICD-10-CM | POA: Diagnosis not present

## 2011-05-21 DIAGNOSIS — IMO0001 Reserved for inherently not codable concepts without codable children: Secondary | ICD-10-CM | POA: Diagnosis not present

## 2011-05-21 DIAGNOSIS — R269 Unspecified abnormalities of gait and mobility: Secondary | ICD-10-CM | POA: Diagnosis not present

## 2011-05-21 DIAGNOSIS — M549 Dorsalgia, unspecified: Secondary | ICD-10-CM | POA: Diagnosis not present

## 2011-05-21 DIAGNOSIS — R279 Unspecified lack of coordination: Secondary | ICD-10-CM | POA: Diagnosis not present

## 2011-05-22 ENCOUNTER — Ambulatory Visit (HOSPITAL_BASED_OUTPATIENT_CLINIC_OR_DEPARTMENT_OTHER)
Admission: RE | Admit: 2011-05-22 | Discharge: 2011-05-22 | Disposition: A | Discharge status: Home / Self Care | Payer: Medicare Other | Source: Ambulatory Visit | Attending: Family Medicine | Admitting: Family Medicine

## 2011-05-22 DIAGNOSIS — M5126 Other intervertebral disc displacement, lumbar region: Secondary | ICD-10-CM | POA: Diagnosis not present

## 2011-05-22 DIAGNOSIS — R209 Unspecified disturbances of skin sensation: Secondary | ICD-10-CM | POA: Insufficient documentation

## 2011-05-22 DIAGNOSIS — M48061 Spinal stenosis, lumbar region without neurogenic claudication: Secondary | ICD-10-CM | POA: Insufficient documentation

## 2011-05-22 DIAGNOSIS — W19XXXA Unspecified fall, initial encounter: Secondary | ICD-10-CM | POA: Diagnosis not present

## 2011-05-22 DIAGNOSIS — M519 Unspecified thoracic, thoracolumbar and lumbosacral intervertebral disc disorder: Secondary | ICD-10-CM | POA: Diagnosis not present

## 2011-05-22 DIAGNOSIS — M545 Low back pain, unspecified: Secondary | ICD-10-CM | POA: Insufficient documentation

## 2011-05-22 DIAGNOSIS — M79604 Pain in right leg: Secondary | ICD-10-CM

## 2011-05-22 DIAGNOSIS — M79609 Pain in unspecified limb: Secondary | ICD-10-CM | POA: Diagnosis not present

## 2011-05-22 DIAGNOSIS — M47817 Spondylosis without myelopathy or radiculopathy, lumbosacral region: Secondary | ICD-10-CM | POA: Insufficient documentation

## 2011-05-25 ENCOUNTER — Telehealth: Payer: Self-pay

## 2011-05-25 ENCOUNTER — Ambulatory Visit: Payer: Medicare Other | Admitting: Family Medicine

## 2011-05-25 DIAGNOSIS — IMO0002 Reserved for concepts with insufficient information to code with codable children: Secondary | ICD-10-CM

## 2011-05-25 DIAGNOSIS — Z0289 Encounter for other administrative examinations: Secondary | ICD-10-CM

## 2011-05-25 NOTE — Telephone Encounter (Signed)
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

## 2011-05-25 NOTE — Telephone Encounter (Signed)
Discussed with patient and she voiced understanding.      KP 

## 2011-05-27 ENCOUNTER — Encounter: Payer: Self-pay | Admitting: Family Medicine

## 2011-05-27 ENCOUNTER — Ambulatory Visit (INDEPENDENT_AMBULATORY_CARE_PROVIDER_SITE_OTHER): Payer: Medicare Other | Admitting: Family Medicine

## 2011-05-27 VITALS — BP 126/82 | HR 105 | Temp 97.4°F | Wt 258.4 lb

## 2011-05-27 DIAGNOSIS — M549 Dorsalgia, unspecified: Secondary | ICD-10-CM

## 2011-05-27 DIAGNOSIS — J329 Chronic sinusitis, unspecified: Secondary | ICD-10-CM | POA: Diagnosis not present

## 2011-05-27 MED ORDER — BECLOMETHASONE DIPROPIONATE 80 MCG/ACT NA AERS: 2.0000 | INHALATION_SPRAY | Freq: Every day | NASAL | Status: DC

## 2011-05-27 MED ORDER — CEFUROXIME AXETIL 500 MG PO TABS: 500.0000 mg | ORAL_TABLET | Freq: Two times a day (BID) | ORAL | Status: AC

## 2011-05-27 NOTE — Patient Instructions (Signed)

## 2011-05-27 NOTE — Progress Notes (Signed)
  Subjective:     Erin [name] is a 66 y.o. female who presents for evaluation of symptoms of a URI. Symptoms include congestion, nasal congestion, nausea without vomiting, no  fever, productive cough with  green colored sputum, purulent nasal discharge and sinus pressure. Onset of symptoms was 3 weeks ago, and has been gradually worsening since that time. Treatment to date: cough suppressants and decongestants.  The following portions of the patient's history were reviewed and updated as appropriate: allergies, current medications, past family history, past medical history, past social history, past surgical history and problem list.  Review of Systems Pertinent items are noted in HPI.   Objective:    BP 126/82  Pulse 105  Temp(Src) 97.4 F (36.3 C) (Oral)  Wt 258 lb 6.4 oz (117.209 kg)  SpO2 96% General appearance: alert, cooperative and mild distress Ears: abnormal TM right ear - air-fluid level Nose: green and yellow discharge, moderate congestion, turbinates red, swollen, edematous, sinus tenderness bilateral Throat: lips, mucosa, and tongue normal; teeth and gums normal Neck: no adenopathy, supple, symmetrical, trachea midline and thyroid not enlarged, symmetric, no tenderness/mass/nodules Lungs: clear to auscultation bilaterally Heart: S1, S2 normal   Assessment:    sinusitis   Plan:    Discussed the diagnosis and treatment of sinusitis. Suggested symptomatic OTC remedies. Nasal saline spray for congestion. Ceftin per orders. Nasal steroids per orders. Follow up as needed.

## 2011-05-31 DIAGNOSIS — G4710 Hypersomnia, unspecified: Secondary | ICD-10-CM | POA: Diagnosis not present

## 2011-05-31 DIAGNOSIS — G4730 Sleep apnea, unspecified: Secondary | ICD-10-CM | POA: Diagnosis not present

## 2011-06-02 DIAGNOSIS — M545 Low back pain, unspecified: Secondary | ICD-10-CM | POA: Diagnosis not present

## 2011-06-04 DIAGNOSIS — M47817 Spondylosis without myelopathy or radiculopathy, lumbosacral region: Secondary | ICD-10-CM | POA: Diagnosis not present

## 2011-06-04 DIAGNOSIS — M961 Postlaminectomy syndrome, not elsewhere classified: Secondary | ICD-10-CM | POA: Diagnosis not present

## 2011-06-04 DIAGNOSIS — M5137 Other intervertebral disc degeneration, lumbosacral region: Secondary | ICD-10-CM | POA: Diagnosis not present

## 2011-06-07 ENCOUNTER — Telehealth: Payer: Self-pay

## 2011-06-07 DIAGNOSIS — R0609 Other forms of dyspnea: Secondary | ICD-10-CM | POA: Diagnosis not present

## 2011-06-07 DIAGNOSIS — J310 Chronic rhinitis: Secondary | ICD-10-CM | POA: Diagnosis not present

## 2011-06-07 DIAGNOSIS — G4730 Sleep apnea, unspecified: Secondary | ICD-10-CM | POA: Diagnosis not present

## 2011-06-07 DIAGNOSIS — G4710 Hypersomnia, unspecified: Secondary | ICD-10-CM | POA: Diagnosis not present

## 2011-06-07 NOTE — Telephone Encounter (Signed)
Patient called complaining that the new medication, Glucotrol 5mg , that she is taking one tablet once a day is causing her blood sugar to drop below 70 a lot.  She is concerned and wants to discuss with you.

## 2011-06-07 NOTE — Telephone Encounter (Signed)
Discussed with patient and she agreed to break the tablet in half and do 1/2 twice a day and she will call if she has anymore concerns with her Blood sugars.      KP

## 2011-06-07 NOTE — Telephone Encounter (Signed)
She can break table in half and take 1/2 bid

## 2011-06-16 DIAGNOSIS — G4710 Hypersomnia, unspecified: Secondary | ICD-10-CM | POA: Diagnosis not present

## 2011-06-16 DIAGNOSIS — G4730 Sleep apnea, unspecified: Secondary | ICD-10-CM | POA: Diagnosis not present

## 2011-06-17 DIAGNOSIS — E119 Type 2 diabetes mellitus without complications: Secondary | ICD-10-CM | POA: Diagnosis not present

## 2011-06-18 ENCOUNTER — Other Ambulatory Visit: Payer: Self-pay | Admitting: Family Medicine

## 2011-06-18 DIAGNOSIS — R0609 Other forms of dyspnea: Secondary | ICD-10-CM | POA: Diagnosis not present

## 2011-06-18 DIAGNOSIS — I369 Nonrheumatic tricuspid valve disorder, unspecified: Secondary | ICD-10-CM | POA: Diagnosis not present

## 2011-06-22 ENCOUNTER — Other Ambulatory Visit: Payer: Self-pay

## 2011-06-22 MED ORDER — GLIPIZIDE 5 MG PO TABS: 2.5000 mg | ORAL_TABLET | Freq: Two times a day (BID) | ORAL | Status: DC

## 2011-06-22 MED ORDER — GLIPIZIDE 5 MG PO TABS: 5.0000 mg | ORAL_TABLET | Freq: Two times a day (BID) | ORAL | Status: DC

## 2011-06-22 NOTE — Telephone Encounter (Signed)
Patient called and needed her medication to be faxed to [facility] she is taking the glucotrol 1/2 tablet bid because it was causing BP to drop too low..prescription  faxed    KP

## 2011-06-30 DIAGNOSIS — E119 Type 2 diabetes mellitus without complications: Secondary | ICD-10-CM | POA: Diagnosis not present

## 2011-07-05 DIAGNOSIS — IMO0002 Reserved for concepts with insufficient information to code with codable children: Secondary | ICD-10-CM | POA: Diagnosis not present

## 2011-07-05 DIAGNOSIS — E119 Type 2 diabetes mellitus without complications: Secondary | ICD-10-CM | POA: Diagnosis not present

## 2011-07-05 DIAGNOSIS — M545 Low back pain, unspecified: Secondary | ICD-10-CM | POA: Diagnosis not present

## 2011-07-05 DIAGNOSIS — M5137 Other intervertebral disc degeneration, lumbosacral region: Secondary | ICD-10-CM | POA: Diagnosis not present

## 2011-07-30 DIAGNOSIS — J449 Chronic obstructive pulmonary disease, unspecified: Secondary | ICD-10-CM | POA: Diagnosis not present

## 2011-08-02 DIAGNOSIS — M545 Low back pain, unspecified: Secondary | ICD-10-CM | POA: Diagnosis not present

## 2011-08-02 DIAGNOSIS — IMO0002 Reserved for concepts with insufficient information to code with codable children: Secondary | ICD-10-CM | POA: Diagnosis not present

## 2011-08-03 DIAGNOSIS — J449 Chronic obstructive pulmonary disease, unspecified: Secondary | ICD-10-CM | POA: Diagnosis not present

## 2011-08-05 DIAGNOSIS — R0989 Other specified symptoms and signs involving the circulatory and respiratory systems: Secondary | ICD-10-CM | POA: Diagnosis not present

## 2011-08-05 DIAGNOSIS — G4710 Hypersomnia, unspecified: Secondary | ICD-10-CM | POA: Diagnosis not present

## 2011-08-05 DIAGNOSIS — R0609 Other forms of dyspnea: Secondary | ICD-10-CM | POA: Diagnosis not present

## 2011-08-05 DIAGNOSIS — J310 Chronic rhinitis: Secondary | ICD-10-CM | POA: Diagnosis not present

## 2011-08-06 ENCOUNTER — Telehealth: Payer: Self-pay | Admitting: *Deleted

## 2011-08-06 MED ORDER — FENOFIBRATE 160 MG PO TABS: 160.0000 mg | ORAL_TABLET | Freq: Every day | ORAL | Status: DC

## 2011-08-06 NOTE — Telephone Encounter (Signed)
Per the denial letter plain Fenofibrate.

## 2011-08-06 NOTE — Telephone Encounter (Signed)
Ok to send fenofibrate 160- mg  #30  1 po qd  2 refills

## 2011-08-06 NOTE — Telephone Encounter (Signed)
Prior Auth denied preferred drug Fenofibrate. .Please advise

## 2011-08-06 NOTE — Telephone Encounter (Signed)
What will they pay for? 

## 2011-08-23 DIAGNOSIS — M545 Low back pain, unspecified: Secondary | ICD-10-CM | POA: Diagnosis not present

## 2011-08-23 DIAGNOSIS — M5137 Other intervertebral disc degeneration, lumbosacral region: Secondary | ICD-10-CM | POA: Diagnosis not present

## 2011-09-29 DIAGNOSIS — M545 Low back pain, unspecified: Secondary | ICD-10-CM | POA: Diagnosis not present

## 2011-09-30 ENCOUNTER — Other Ambulatory Visit: Payer: Self-pay | Admitting: Family Medicine

## 2011-10-04 ENCOUNTER — Other Ambulatory Visit: Payer: Self-pay

## 2011-10-04 MED ORDER — GLIPIZIDE 5 MG PO TABS: 2.5000 mg | ORAL_TABLET | Freq: Two times a day (BID) | ORAL | Status: DC

## 2011-10-19 DIAGNOSIS — R0609 Other forms of dyspnea: Secondary | ICD-10-CM | POA: Diagnosis not present

## 2011-10-19 DIAGNOSIS — G4710 Hypersomnia, unspecified: Secondary | ICD-10-CM | POA: Diagnosis not present

## 2011-10-19 DIAGNOSIS — J310 Chronic rhinitis: Secondary | ICD-10-CM | POA: Diagnosis not present

## 2011-10-19 DIAGNOSIS — G4730 Sleep apnea, unspecified: Secondary | ICD-10-CM | POA: Diagnosis not present

## 2011-11-11 DIAGNOSIS — G4710 Hypersomnia, unspecified: Secondary | ICD-10-CM | POA: Diagnosis not present

## 2012-01-11 DIAGNOSIS — IMO0002 Reserved for concepts with insufficient information to code with codable children: Secondary | ICD-10-CM | POA: Diagnosis not present

## 2012-01-11 DIAGNOSIS — M545 Low back pain, unspecified: Secondary | ICD-10-CM | POA: Diagnosis not present

## 2012-01-14 ENCOUNTER — Other Ambulatory Visit: Payer: Self-pay | Admitting: Family Medicine

## 2012-01-14 DIAGNOSIS — IMO0002 Reserved for concepts with insufficient information to code with codable children: Secondary | ICD-10-CM | POA: Diagnosis not present

## 2012-01-14 DIAGNOSIS — M48061 Spinal stenosis, lumbar region without neurogenic claudication: Secondary | ICD-10-CM | POA: Diagnosis not present

## 2012-01-14 DIAGNOSIS — M545 Low back pain, unspecified: Secondary | ICD-10-CM | POA: Diagnosis not present

## 2012-01-19 ENCOUNTER — Other Ambulatory Visit: Payer: Self-pay

## 2012-01-19 NOTE — Telephone Encounter (Signed)
dup

## 2012-01-19 NOTE — Telephone Encounter (Signed)
Pt called in checking on Rx. Pramipexole. Out of meds. Can't sleep due to restless legs.  OV 05/27/11. Last filled 06/18/11 #90 x1. Plz advise   MW

## 2012-01-21 NOTE — Telephone Encounter (Signed)
#  30, no RF sent

## 2012-01-25 DIAGNOSIS — R0989 Other specified symptoms and signs involving the circulatory and respiratory systems: Secondary | ICD-10-CM | POA: Diagnosis not present

## 2012-01-25 DIAGNOSIS — R0609 Other forms of dyspnea: Secondary | ICD-10-CM | POA: Diagnosis not present

## 2012-01-25 DIAGNOSIS — J018 Other acute sinusitis: Secondary | ICD-10-CM | POA: Diagnosis not present

## 2012-01-25 DIAGNOSIS — J310 Chronic rhinitis: Secondary | ICD-10-CM | POA: Diagnosis not present

## 2012-01-25 DIAGNOSIS — G4710 Hypersomnia, unspecified: Secondary | ICD-10-CM | POA: Diagnosis not present

## 2012-01-25 DIAGNOSIS — G4730 Sleep apnea, unspecified: Secondary | ICD-10-CM | POA: Diagnosis not present

## 2012-01-31 ENCOUNTER — Encounter: Payer: Self-pay | Admitting: Family Medicine

## 2012-01-31 ENCOUNTER — Ambulatory Visit (INDEPENDENT_AMBULATORY_CARE_PROVIDER_SITE_OTHER): Payer: Medicare Other | Admitting: Family Medicine

## 2012-01-31 VITALS — BP 130/76 | HR 95 | Temp 97.7°F | Wt 264.0 lb

## 2012-01-31 DIAGNOSIS — E1165 Type 2 diabetes mellitus with hyperglycemia: Secondary | ICD-10-CM

## 2012-01-31 DIAGNOSIS — E559 Vitamin D deficiency, unspecified: Secondary | ICD-10-CM

## 2012-01-31 DIAGNOSIS — IMO0002 Reserved for concepts with insufficient information to code with codable children: Secondary | ICD-10-CM

## 2012-01-31 DIAGNOSIS — IMO0001 Reserved for inherently not codable concepts without codable children: Secondary | ICD-10-CM

## 2012-01-31 DIAGNOSIS — Z23 Encounter for immunization: Secondary | ICD-10-CM

## 2012-01-31 DIAGNOSIS — R609 Edema, unspecified: Secondary | ICD-10-CM

## 2012-01-31 DIAGNOSIS — E785 Hyperlipidemia, unspecified: Secondary | ICD-10-CM

## 2012-01-31 DIAGNOSIS — I10 Essential (primary) hypertension: Secondary | ICD-10-CM

## 2012-01-31 DIAGNOSIS — M1990 Unspecified osteoarthritis, unspecified site: Secondary | ICD-10-CM

## 2012-01-31 DIAGNOSIS — M129 Arthropathy, unspecified: Secondary | ICD-10-CM

## 2012-01-31 DIAGNOSIS — G2581 Restless legs syndrome: Secondary | ICD-10-CM

## 2012-01-31 LAB — BASIC METABOLIC PANEL
BUN: 31 mg/dL — ABNORMAL HIGH (ref 6–23)
CO2: 24 mEq/L (ref 19–32)
Calcium: 9 mg/dL (ref 8.4–10.5)
Chloride: 106 mEq/L (ref 96–112)
Creatinine, Ser: 1.3 mg/dL — ABNORMAL HIGH (ref 0.4–1.2)
GFR: 43.07 mL/min — ABNORMAL LOW (ref >60.00)
Glucose, Bld: 87 mg/dL (ref 70–99)
Potassium: 4.6 mEq/L (ref 3.5–5.1)
Sodium: 137 mEq/L (ref 135–145)

## 2012-01-31 LAB — HEMOGLOBIN A1C: Hgb A1c MFr Bld: 6.6 % — ABNORMAL HIGH (ref 4.6–6.5)

## 2012-01-31 LAB — POCT URINALYSIS DIPSTICK
Bilirubin, UA: NEGATIVE
Blood, UA: NEGATIVE
Glucose, UA: NEGATIVE
Ketones, UA: NEGATIVE
Leukocytes, UA: NEGATIVE
Nitrite, UA: NEGATIVE
Protein, UA: NEGATIVE
Spec Grav, UA: 1.02
Urobilinogen, UA: 0.2
pH, UA: 6

## 2012-01-31 LAB — MICROALBUMIN / CREATININE URINE RATIO
Creatinine,U: 119.4 mg/dL
Microalb Creat Ratio: 0.8 mg/g (ref 0.0–30.0)
Microalb, Ur: 0.9 mg/dL (ref 0.0–1.9)

## 2012-01-31 LAB — HEPATIC FUNCTION PANEL
ALT: 21 U/L (ref 0–35)
AST: 16 U/L (ref 0–37)
Albumin: 3.6 g/dL (ref 3.5–5.2)
Alkaline Phosphatase: 72 U/L (ref 39–117)
Bilirubin, Direct: 0 mg/dL (ref 0.0–0.3)
Total Bilirubin: 0.5 mg/dL (ref 0.3–1.2)
Total Protein: 7.1 g/dL (ref 6.0–8.3)

## 2012-01-31 LAB — LIPID PANEL
Cholesterol: 211 mg/dL — ABNORMAL HIGH (ref 0–200)
HDL: 40.6 mg/dL (ref >39.00)
Total CHOL/HDL Ratio: 5
Triglycerides: 208 mg/dL — ABNORMAL HIGH (ref 0.0–149.0)
VLDL: 41.6 mg/dL — ABNORMAL HIGH (ref 0.0–40.0)

## 2012-01-31 LAB — LDL CHOLESTEROL, DIRECT: Direct LDL: 136.4 mg/dL

## 2012-01-31 MED ORDER — FUROSEMIDE 20 MG PO TABS: 20.0000 mg | ORAL_TABLET | Freq: Two times a day (BID) | ORAL | Status: DC

## 2012-01-31 MED ORDER — ATORVASTATIN CALCIUM 20 MG PO TABS: 20.0000 mg | ORAL_TABLET | Freq: Every day | ORAL | Status: DC

## 2012-01-31 MED ORDER — PRAMIPEXOLE DIHYDROCHLORIDE 1 MG PO TABS: 1.0000 mg | ORAL_TABLET | Freq: Every day | ORAL | Status: DC

## 2012-01-31 MED ORDER — FENOFIBRATE 160 MG PO TABS: 160.0000 mg | ORAL_TABLET | Freq: Every day | ORAL | Status: DC

## 2012-01-31 NOTE — Progress Notes (Signed)
  Subjective:    Patient ID: Erin [name], female    DOB: [birth year], 66 y.o.   MRN: [id number]  HPI  HYPERTENSION Disease Monitoring Blood pressure range-  130/70  Chest pain- no      Dyspnea- no Medications Compliance- good Lightheadedness- no   Edema- some   DIABETES Disease Monitoring Blood Sugar ranges- 89-200 Polyuria- no New Visual problems- no Medications Compliance- good Hypoglycemic symptoms- no   HYPERLIPIDEMIA Disease Monitoring See symptoms for Hypertension Medications Compliance- poor RUQ pain- no  Muscle aches- no  ROS See HPI above   PMH Smoking Status noted    Review of Systems    as above Objective:   Physical Exam  Constitutional: She is oriented to person, place, and time. She appears well-developed and well-nourished.  Cardiovascular: Normal rate, regular rhythm and normal heart sounds.   Pulmonary/Chest: Effort normal and breath sounds normal. No respiratory distress. She has no wheezes. She has no rales.  Musculoskeletal: She exhibits no edema and no tenderness.       Sensory exam of the foot is normal, tested with the monofilament. Good pulses, no lesions or ulcers, good peripheral pulses.   Neurological: She is alert and oriented to person, place, and time.  Psychiatric: She has a normal mood and affect. Her behavior is normal. Judgment and thought content normal.   Filed Vitals:   01/31/12 0915  BP: 130/76  Pulse: 95  Temp: 97.7 F (36.5 C)  TempSrc: Oral  Weight: 264 lb (119.75 kg)  SpO2: 95%         Assessment & Plan:

## 2012-01-31 NOTE — Patient Instructions (Addendum)

## 2012-02-01 ENCOUNTER — Encounter: Payer: Self-pay | Admitting: Family Medicine

## 2012-02-01 NOTE — Assessment & Plan Note (Signed)
Check labs con't meds 

## 2012-02-01 NOTE — Assessment & Plan Note (Signed)
Stable con't meds 

## 2012-02-09 ENCOUNTER — Telehealth: Payer: Self-pay

## 2012-02-09 NOTE — Telephone Encounter (Signed)
Advised pt: Notes Recorded by Lelon Perla, DO on 02/07/2012 at 9:47 PM DM controlled Kidney function better Lipids--- better but not at goal. I think pt had not been taking meds. They were restarted at ov. Recheck 3 months 272.4 250.00 Lipid, hep, bmp, hgba1c  Pt stated understanding. I added labs to f/u appt 05/02/12 9:15am pt aware       MW

## 2012-02-09 NOTE — Telephone Encounter (Signed)
Advised pt

## 2012-02-17 DIAGNOSIS — J019 Acute sinusitis, unspecified: Secondary | ICD-10-CM | POA: Diagnosis not present

## 2012-02-17 DIAGNOSIS — R0609 Other forms of dyspnea: Secondary | ICD-10-CM | POA: Diagnosis not present

## 2012-02-17 DIAGNOSIS — G4710 Hypersomnia, unspecified: Secondary | ICD-10-CM | POA: Diagnosis not present

## 2012-02-17 DIAGNOSIS — R0989 Other specified symptoms and signs involving the circulatory and respiratory systems: Secondary | ICD-10-CM | POA: Diagnosis not present

## 2012-02-17 DIAGNOSIS — G4730 Sleep apnea, unspecified: Secondary | ICD-10-CM | POA: Diagnosis not present

## 2012-02-22 DIAGNOSIS — M545 Low back pain, unspecified: Secondary | ICD-10-CM | POA: Diagnosis not present

## 2012-02-23 ENCOUNTER — Other Ambulatory Visit: Payer: Self-pay | Admitting: Anesthesiology

## 2012-02-23 DIAGNOSIS — M545 Low back pain, unspecified: Secondary | ICD-10-CM

## 2012-02-24 ENCOUNTER — Ambulatory Visit
Admission: RE | Admit: 2012-02-24 | Discharge: 2012-02-24 | Disposition: A | Discharge status: Home / Self Care | Payer: Medicare Other | Source: Ambulatory Visit | Attending: Anesthesiology | Admitting: Anesthesiology

## 2012-02-24 DIAGNOSIS — M545 Low back pain, unspecified: Secondary | ICD-10-CM

## 2012-02-24 DIAGNOSIS — M47817 Spondylosis without myelopathy or radiculopathy, lumbosacral region: Secondary | ICD-10-CM | POA: Diagnosis not present

## 2012-02-27 ENCOUNTER — Other Ambulatory Visit: Payer: Self-pay | Admitting: Internal Medicine

## 2012-02-28 NOTE — Telephone Encounter (Signed)
Last seen 01/31/12 and filled 05/17/11 # 20. Please advise    KP

## 2012-04-18 ENCOUNTER — Encounter: Payer: Self-pay | Admitting: Lab

## 2012-04-19 ENCOUNTER — Ambulatory Visit (INDEPENDENT_AMBULATORY_CARE_PROVIDER_SITE_OTHER): Payer: Medicare Other | Admitting: Family Medicine

## 2012-04-19 ENCOUNTER — Encounter: Payer: Self-pay | Admitting: Family Medicine

## 2012-04-19 VITALS — BP 130/60 | HR 99 | Temp 98.2°F | Wt 262.2 lb

## 2012-04-19 DIAGNOSIS — E785 Hyperlipidemia, unspecified: Secondary | ICD-10-CM | POA: Diagnosis not present

## 2012-04-19 DIAGNOSIS — F3289 Other specified depressive episodes: Secondary | ICD-10-CM

## 2012-04-19 DIAGNOSIS — F329 Major depressive disorder, single episode, unspecified: Secondary | ICD-10-CM

## 2012-04-19 DIAGNOSIS — E119 Type 2 diabetes mellitus without complications: Secondary | ICD-10-CM | POA: Diagnosis not present

## 2012-04-19 DIAGNOSIS — I10 Essential (primary) hypertension: Secondary | ICD-10-CM | POA: Diagnosis not present

## 2012-04-19 DIAGNOSIS — F32A Depression, unspecified: Secondary | ICD-10-CM

## 2012-04-19 DIAGNOSIS — IMO0001 Reserved for inherently not codable concepts without codable children: Secondary | ICD-10-CM

## 2012-04-19 DIAGNOSIS — E1165 Type 2 diabetes mellitus with hyperglycemia: Secondary | ICD-10-CM

## 2012-04-19 DIAGNOSIS — Z79899 Other long term (current) drug therapy: Secondary | ICD-10-CM | POA: Diagnosis not present

## 2012-04-19 LAB — POCT URINALYSIS DIPSTICK
Bilirubin, UA: NEGATIVE
Blood, UA: NEGATIVE
Glucose, UA: NEGATIVE
Ketones, UA: NEGATIVE
Leukocytes, UA: NEGATIVE
Nitrite, UA: NEGATIVE
Protein, UA: NEGATIVE
Spec Grav, UA: 1.02
Urobilinogen, UA: 0.2
pH, UA: 6

## 2012-04-19 LAB — HEPATIC FUNCTION PANEL
ALT: 19 U/L (ref 0–35)
AST: 19 U/L (ref 0–37)
Albumin: 3.8 g/dL (ref 3.5–5.2)
Alkaline Phosphatase: 72 U/L (ref 39–117)
Bilirubin, Direct: 0.1 mg/dL (ref 0.0–0.3)
Total Bilirubin: 0.6 mg/dL (ref 0.3–1.2)
Total Protein: 7.3 g/dL (ref 6.0–8.3)

## 2012-04-19 LAB — MICROALBUMIN / CREATININE URINE RATIO
Creatinine,U: 320.2 mg/dL
Microalb Creat Ratio: 0.9 mg/g (ref 0.0–30.0)
Microalb, Ur: 3 mg/dL — ABNORMAL HIGH (ref 0.0–1.9)

## 2012-04-19 LAB — BASIC METABOLIC PANEL
BUN: 24 mg/dL — ABNORMAL HIGH (ref 6–23)
CO2: 22 mEq/L (ref 19–32)
Calcium: 9.4 mg/dL (ref 8.4–10.5)
Chloride: 103 mEq/L (ref 96–112)
Creatinine, Ser: 1.4 mg/dL — ABNORMAL HIGH (ref 0.4–1.2)
GFR: 41.22 mL/min — ABNORMAL LOW (ref >60.00)
Glucose, Bld: 126 mg/dL — ABNORMAL HIGH (ref 70–99)
Potassium: 4.1 mEq/L (ref 3.5–5.1)
Sodium: 135 mEq/L (ref 135–145)

## 2012-04-19 LAB — LIPID PANEL
Cholesterol: 151 mg/dL (ref 0–200)
HDL: 43.1 mg/dL (ref >39.00)
LDL Cholesterol: 77 mg/dL (ref 0–99)
Total CHOL/HDL Ratio: 4
Triglycerides: 155 mg/dL — ABNORMAL HIGH (ref 0.0–149.0)
VLDL: 31 mg/dL (ref 0.0–40.0)

## 2012-04-19 LAB — HEMOGLOBIN A1C: Hgb A1c MFr Bld: 6.6 % — ABNORMAL HIGH (ref 4.6–6.5)

## 2012-04-19 MED ORDER — PRAMIPEXOLE DIHYDROCHLORIDE 1 MG PO TABS: 1.0000 mg | ORAL_TABLET | Freq: Every day | ORAL | Status: DC

## 2012-04-19 MED ORDER — SERTRALINE HCL 50 MG PO TABS: 50.0000 mg | ORAL_TABLET | Freq: Every day | ORAL | Status: DC

## 2012-04-19 MED ORDER — GLIPIZIDE 5 MG PO TABS: 2.5000 mg | ORAL_TABLET | Freq: Two times a day (BID) | ORAL | Status: DC

## 2012-04-19 MED ORDER — LISINOPRIL 40 MG PO TABS: 40.0000 mg | ORAL_TABLET | Freq: Every day | ORAL | Status: DC

## 2012-04-19 NOTE — Progress Notes (Signed)
  Subjective:    Patient ID: Erin [name], female    DOB: [birth year], 67 y.o.   MRN: [id number]  HPI  HYPERTENSION Disease Monitoring Blood pressure range-not checking Chest pain- no      Dyspnea- no Medications Compliance- good Lightheadedness- no   Edema- no   DIABETES Disease Monitoring Blood Sugar ranges-90-200 Polyuria- no New Visual problems- no Medications Compliance- fair Hypoglycemic symptoms- no   HYPERLIPIDEMIA Disease Monitoring See symptoms for Hypertension Medications Compliance- good RUQ pain- good   Muscle aches- good  ROS See HPI above   PMH Smoking Status noted     Review of Systems As above    Objective:   Physical Exam BP 130/60  Pulse 99  Temp 98.2 F (36.8 C) (Oral)  Wt 262 lb 3.2 oz (118.933 kg)  SpO2 96% General appearance: alert, cooperative, appears stated age and no distress Neck: no adenopathy, no carotid bruit, no JVD, supple, symmetrical, trachea midline and thyroid not enlarged, symmetric, no tenderness/mass/nodules Lungs: clear to auscultation bilaterally Heart: S1, S2 normal Extremities: extremities normal, atraumatic, no cyanosis or edema Sensory exam of the foot is normal, tested with the monofilament. Good pulses, no lesions or ulcers, good peripheral pulses.        Assessment & Plan:

## 2012-04-19 NOTE — Patient Instructions (Addendum)
Diabetes and Sick Day Management Blood sugar (glucose) can be more difficult to control when you are sick. Colds, fever, flu, nausea, vomiting, and diarrhea are all examples of common illnesses that can cause problems for people with diabetes. Loss of body fluids (dehydration) from fever, vomiting, diarrhea, infection, and the stress of a sickness can all cause blood glucose levels to rise. Because of this, it is very important to take your diabetes medicines and to eat some form of carbohydrate food when you are sick. Liquid or soft foods are often tolerated, and they help to replace fluids. HOME CARE INSTRUCTIONS These main guidelines are intended for managing a short-term (24 hours or less) sickness:  Take your usual dose of insulin or oral diabetes medicine. An exception would be if you take any form of metformin. If you cannot eat or drink, you can become dehydrated and should not take this medicine.  Continue to take your insulin even if you are unable to eat solid foods or are vomiting. Your insulin dose may stay the same, or it may need to be increased when you are sick.  You will need to test your blood glucose more often, generally every 2 to 4 hours. If you have type 1 diabetes, test your urine for ketones every 4 hours. If you have type 2 diabetes, test your ketones as directed by your caregiver.  Eat some form of food that contains carbohydrates. The carbohydrates can be in solid or liquid form. You should eat 45 to 50 grams of carbohydrates every 3 to 4 hours.  Replace fluids if fever, vomiting, or diarrhea is present. Ask your caregiver for specific rehydration instructions.  Watch carefully for the signs of ketoacidosis if you have type 1 diabetes. Call your caregiver if any of the following symptoms are present, especially in children:  Moderate to large ketones in the urine along with a high blood glucose level.  Severe nausea.  Vomiting.  Diarrhea.  Abdominal  pain.  Rapid breathing.  Drink extra liquids that do not contain sugar, such as water or sugar-free liquids, if your blood glucose is higher than 240 mg/dl.  Be careful with over-the-counter medicines. Read the labels. They may contain sugar or types of sugars that can raise your blood glucose. Food Choices for Illness All of the food choices below contain around 15 grams of carbohydrates. Plan ahead and keep some of these foods around.    to  cup carbonated beverage containing sugar. Carbonated beverages will usually be better tolerated if they are opened and left at room temperature for a few minutes.   of a twin frozen ice pop.   cup sweetened gelatin dessert.   cup juice.   cup ice cream or frozen yogurt.   cup cooked cereal.   cup sherbet.  1 cup broth-based soup with noodles or rice, reconstituted with water.  1 cup cream soup.   cup regular custard.   cup regular pudding.  1 cup sports drink.  1 cup plain yogurt.  1 slice toast.  6 squares saltine crackers.  5 vanilla wafers. SEEK MEDICAL CARE IF:   You are sick and see no improvement in 6 to 8 hours.  You are unable to eat regular foods for more than 24 hours.  You have blood glucose readings over 240 mg/dl, 2 times in a row.  Your blood glucose is less than 70 mg/dl, 2 times in a row.  You are not sure what to do to take care of   yourself.  You vomit more than once in 4 to 6 hours.  You have moderate or large ketones in your urine.  You have a fever.  Your symptoms are getting worse even though you are following your caregiver's instructions. Document Released: 03/25/2003 Document Revised: 06/14/2011 Document Reviewed: 09/29/2010 [employer] Patient Information 2013 [employer].  

## 2012-04-19 NOTE — Assessment & Plan Note (Signed)
Take meds as directed Check labs

## 2012-04-19 NOTE — Assessment & Plan Note (Signed)
Cont meds stable 

## 2012-04-19 NOTE — Assessment & Plan Note (Signed)
Check labs Cont' meds 

## 2012-04-25 DIAGNOSIS — M545 Low back pain, unspecified: Secondary | ICD-10-CM | POA: Diagnosis not present

## 2012-05-02 ENCOUNTER — Ambulatory Visit: Payer: Medicare Other | Admitting: Family Medicine

## 2012-05-16 DIAGNOSIS — R0989 Other specified symptoms and signs involving the circulatory and respiratory systems: Secondary | ICD-10-CM | POA: Diagnosis not present

## 2012-05-16 DIAGNOSIS — R0609 Other forms of dyspnea: Secondary | ICD-10-CM | POA: Diagnosis not present

## 2012-05-16 DIAGNOSIS — Z006 Encounter for examination for normal comparison and control in clinical research program: Secondary | ICD-10-CM | POA: Diagnosis not present

## 2012-05-16 DIAGNOSIS — J310 Chronic rhinitis: Secondary | ICD-10-CM | POA: Diagnosis not present

## 2012-05-16 DIAGNOSIS — G4710 Hypersomnia, unspecified: Secondary | ICD-10-CM | POA: Diagnosis not present

## 2012-05-23 DIAGNOSIS — E119 Type 2 diabetes mellitus without complications: Secondary | ICD-10-CM | POA: Diagnosis not present

## 2012-05-23 DIAGNOSIS — IMO0001 Reserved for inherently not codable concepts without codable children: Secondary | ICD-10-CM | POA: Diagnosis not present

## 2012-05-23 DIAGNOSIS — I10 Essential (primary) hypertension: Secondary | ICD-10-CM | POA: Diagnosis not present

## 2012-05-23 DIAGNOSIS — E785 Hyperlipidemia, unspecified: Secondary | ICD-10-CM | POA: Diagnosis not present

## 2012-05-23 DIAGNOSIS — F329 Major depressive disorder, single episode, unspecified: Secondary | ICD-10-CM | POA: Diagnosis not present

## 2012-05-23 NOTE — Addendum Note (Signed)
Addended by: Silvio Pate D on: 05/23/2012 04:15 PM   Modules accepted: Orders

## 2012-05-24 LAB — PROTEIN, URINE, 24 HOUR: Protein, Urine: <3 mg/dL

## 2012-06-01 ENCOUNTER — Encounter: Payer: Self-pay | Admitting: Family Medicine

## 2012-06-05 DIAGNOSIS — M545 Low back pain, unspecified: Secondary | ICD-10-CM | POA: Diagnosis not present

## 2012-06-07 DIAGNOSIS — R0989 Other specified symptoms and signs involving the circulatory and respiratory systems: Secondary | ICD-10-CM | POA: Diagnosis not present

## 2012-06-07 DIAGNOSIS — R0609 Other forms of dyspnea: Secondary | ICD-10-CM | POA: Diagnosis not present

## 2012-06-07 DIAGNOSIS — G4730 Sleep apnea, unspecified: Secondary | ICD-10-CM | POA: Diagnosis not present

## 2012-06-07 DIAGNOSIS — J310 Chronic rhinitis: Secondary | ICD-10-CM | POA: Diagnosis not present

## 2012-06-07 DIAGNOSIS — G4710 Hypersomnia, unspecified: Secondary | ICD-10-CM | POA: Diagnosis not present

## 2012-06-07 LAB — PULMONARY FUNCTION TEST

## 2012-06-08 DIAGNOSIS — M2560 Stiffness of unspecified joint, not elsewhere classified: Secondary | ICD-10-CM | POA: Diagnosis not present

## 2012-06-08 DIAGNOSIS — R269 Unspecified abnormalities of gait and mobility: Secondary | ICD-10-CM | POA: Diagnosis not present

## 2012-06-08 DIAGNOSIS — M6281 Muscle weakness (generalized): Secondary | ICD-10-CM | POA: Diagnosis not present

## 2012-06-08 DIAGNOSIS — R911 Solitary pulmonary nodule: Secondary | ICD-10-CM | POA: Diagnosis not present

## 2012-06-08 DIAGNOSIS — R293 Abnormal posture: Secondary | ICD-10-CM | POA: Diagnosis not present

## 2012-06-08 DIAGNOSIS — M545 Low back pain, unspecified: Secondary | ICD-10-CM | POA: Diagnosis not present

## 2012-06-08 DIAGNOSIS — R0609 Other forms of dyspnea: Secondary | ICD-10-CM | POA: Diagnosis not present

## 2012-06-08 DIAGNOSIS — J019 Acute sinusitis, unspecified: Secondary | ICD-10-CM | POA: Diagnosis not present

## 2012-06-08 DIAGNOSIS — IMO0001 Reserved for inherently not codable concepts without codable children: Secondary | ICD-10-CM | POA: Diagnosis not present

## 2012-06-12 DIAGNOSIS — R269 Unspecified abnormalities of gait and mobility: Secondary | ICD-10-CM | POA: Diagnosis not present

## 2012-06-12 DIAGNOSIS — M2560 Stiffness of unspecified joint, not elsewhere classified: Secondary | ICD-10-CM | POA: Diagnosis not present

## 2012-06-12 DIAGNOSIS — R293 Abnormal posture: Secondary | ICD-10-CM | POA: Diagnosis not present

## 2012-06-12 DIAGNOSIS — M6281 Muscle weakness (generalized): Secondary | ICD-10-CM | POA: Diagnosis not present

## 2012-06-12 DIAGNOSIS — M545 Low back pain, unspecified: Secondary | ICD-10-CM | POA: Diagnosis not present

## 2012-06-12 DIAGNOSIS — IMO0001 Reserved for inherently not codable concepts without codable children: Secondary | ICD-10-CM | POA: Diagnosis not present

## 2012-06-13 DIAGNOSIS — M545 Low back pain, unspecified: Secondary | ICD-10-CM | POA: Diagnosis not present

## 2012-06-13 DIAGNOSIS — M5380 Other specified dorsopathies, site unspecified: Secondary | ICD-10-CM | POA: Diagnosis not present

## 2012-06-16 DIAGNOSIS — M2560 Stiffness of unspecified joint, not elsewhere classified: Secondary | ICD-10-CM | POA: Diagnosis not present

## 2012-06-16 DIAGNOSIS — R293 Abnormal posture: Secondary | ICD-10-CM | POA: Diagnosis not present

## 2012-06-16 DIAGNOSIS — M545 Low back pain, unspecified: Secondary | ICD-10-CM | POA: Diagnosis not present

## 2012-06-16 DIAGNOSIS — M6281 Muscle weakness (generalized): Secondary | ICD-10-CM | POA: Diagnosis not present

## 2012-06-16 DIAGNOSIS — R269 Unspecified abnormalities of gait and mobility: Secondary | ICD-10-CM | POA: Diagnosis not present

## 2012-06-16 DIAGNOSIS — IMO0001 Reserved for inherently not codable concepts without codable children: Secondary | ICD-10-CM | POA: Diagnosis not present

## 2012-06-20 DIAGNOSIS — M2560 Stiffness of unspecified joint, not elsewhere classified: Secondary | ICD-10-CM | POA: Diagnosis not present

## 2012-06-20 DIAGNOSIS — M545 Low back pain, unspecified: Secondary | ICD-10-CM | POA: Diagnosis not present

## 2012-06-20 DIAGNOSIS — M6281 Muscle weakness (generalized): Secondary | ICD-10-CM | POA: Diagnosis not present

## 2012-06-20 DIAGNOSIS — R293 Abnormal posture: Secondary | ICD-10-CM | POA: Diagnosis not present

## 2012-06-20 DIAGNOSIS — IMO0001 Reserved for inherently not codable concepts without codable children: Secondary | ICD-10-CM | POA: Diagnosis not present

## 2012-06-20 DIAGNOSIS — R269 Unspecified abnormalities of gait and mobility: Secondary | ICD-10-CM | POA: Diagnosis not present

## 2012-06-22 DIAGNOSIS — M6281 Muscle weakness (generalized): Secondary | ICD-10-CM | POA: Diagnosis not present

## 2012-06-22 DIAGNOSIS — IMO0001 Reserved for inherently not codable concepts without codable children: Secondary | ICD-10-CM | POA: Diagnosis not present

## 2012-06-22 DIAGNOSIS — M545 Low back pain, unspecified: Secondary | ICD-10-CM | POA: Diagnosis not present

## 2012-06-22 DIAGNOSIS — R293 Abnormal posture: Secondary | ICD-10-CM | POA: Diagnosis not present

## 2012-06-22 DIAGNOSIS — M2560 Stiffness of unspecified joint, not elsewhere classified: Secondary | ICD-10-CM | POA: Diagnosis not present

## 2012-06-22 DIAGNOSIS — R269 Unspecified abnormalities of gait and mobility: Secondary | ICD-10-CM | POA: Diagnosis not present

## 2012-06-23 DIAGNOSIS — IMO0001 Reserved for inherently not codable concepts without codable children: Secondary | ICD-10-CM | POA: Diagnosis not present

## 2012-06-23 DIAGNOSIS — R293 Abnormal posture: Secondary | ICD-10-CM | POA: Diagnosis not present

## 2012-06-23 DIAGNOSIS — M2560 Stiffness of unspecified joint, not elsewhere classified: Secondary | ICD-10-CM | POA: Diagnosis not present

## 2012-06-23 DIAGNOSIS — R911 Solitary pulmonary nodule: Secondary | ICD-10-CM | POA: Diagnosis not present

## 2012-06-23 DIAGNOSIS — R0609 Other forms of dyspnea: Secondary | ICD-10-CM | POA: Diagnosis not present

## 2012-06-23 DIAGNOSIS — M6281 Muscle weakness (generalized): Secondary | ICD-10-CM | POA: Diagnosis not present

## 2012-06-23 DIAGNOSIS — M545 Low back pain, unspecified: Secondary | ICD-10-CM | POA: Diagnosis not present

## 2012-06-23 DIAGNOSIS — R0989 Other specified symptoms and signs involving the circulatory and respiratory systems: Secondary | ICD-10-CM | POA: Diagnosis not present

## 2012-06-23 DIAGNOSIS — R269 Unspecified abnormalities of gait and mobility: Secondary | ICD-10-CM | POA: Diagnosis not present

## 2012-06-30 DIAGNOSIS — IMO0001 Reserved for inherently not codable concepts without codable children: Secondary | ICD-10-CM | POA: Diagnosis not present

## 2012-06-30 DIAGNOSIS — M2560 Stiffness of unspecified joint, not elsewhere classified: Secondary | ICD-10-CM | POA: Diagnosis not present

## 2012-06-30 DIAGNOSIS — R269 Unspecified abnormalities of gait and mobility: Secondary | ICD-10-CM | POA: Diagnosis not present

## 2012-06-30 DIAGNOSIS — R293 Abnormal posture: Secondary | ICD-10-CM | POA: Diagnosis not present

## 2012-06-30 DIAGNOSIS — M6281 Muscle weakness (generalized): Secondary | ICD-10-CM | POA: Diagnosis not present

## 2012-06-30 DIAGNOSIS — M545 Low back pain, unspecified: Secondary | ICD-10-CM | POA: Diagnosis not present

## 2012-07-03 DIAGNOSIS — M545 Low back pain, unspecified: Secondary | ICD-10-CM | POA: Diagnosis not present

## 2012-07-03 DIAGNOSIS — M6281 Muscle weakness (generalized): Secondary | ICD-10-CM | POA: Diagnosis not present

## 2012-07-03 DIAGNOSIS — R269 Unspecified abnormalities of gait and mobility: Secondary | ICD-10-CM | POA: Diagnosis not present

## 2012-07-03 DIAGNOSIS — R293 Abnormal posture: Secondary | ICD-10-CM | POA: Diagnosis not present

## 2012-07-03 DIAGNOSIS — IMO0001 Reserved for inherently not codable concepts without codable children: Secondary | ICD-10-CM | POA: Diagnosis not present

## 2012-07-03 DIAGNOSIS — M2560 Stiffness of unspecified joint, not elsewhere classified: Secondary | ICD-10-CM | POA: Diagnosis not present

## 2012-07-04 DIAGNOSIS — M5380 Other specified dorsopathies, site unspecified: Secondary | ICD-10-CM | POA: Diagnosis not present

## 2012-07-05 DIAGNOSIS — R293 Abnormal posture: Secondary | ICD-10-CM | POA: Diagnosis not present

## 2012-07-05 DIAGNOSIS — R269 Unspecified abnormalities of gait and mobility: Secondary | ICD-10-CM | POA: Diagnosis not present

## 2012-07-05 DIAGNOSIS — M545 Low back pain, unspecified: Secondary | ICD-10-CM | POA: Diagnosis not present

## 2012-07-05 DIAGNOSIS — M6281 Muscle weakness (generalized): Secondary | ICD-10-CM | POA: Diagnosis not present

## 2012-07-05 DIAGNOSIS — IMO0002 Reserved for concepts with insufficient information to code with codable children: Secondary | ICD-10-CM | POA: Diagnosis not present

## 2012-07-05 DIAGNOSIS — M5380 Other specified dorsopathies, site unspecified: Secondary | ICD-10-CM | POA: Diagnosis not present

## 2012-07-05 DIAGNOSIS — M2560 Stiffness of unspecified joint, not elsewhere classified: Secondary | ICD-10-CM | POA: Diagnosis not present

## 2012-07-05 DIAGNOSIS — Z006 Encounter for examination for normal comparison and control in clinical research program: Secondary | ICD-10-CM | POA: Diagnosis not present

## 2012-07-05 DIAGNOSIS — M5137 Other intervertebral disc degeneration, lumbosacral region: Secondary | ICD-10-CM | POA: Diagnosis not present

## 2012-07-05 DIAGNOSIS — IMO0001 Reserved for inherently not codable concepts without codable children: Secondary | ICD-10-CM | POA: Diagnosis not present

## 2012-07-08 ENCOUNTER — Other Ambulatory Visit: Payer: Self-pay | Admitting: Family Medicine

## 2012-07-10 DIAGNOSIS — G4730 Sleep apnea, unspecified: Secondary | ICD-10-CM | POA: Diagnosis not present

## 2012-07-10 DIAGNOSIS — M545 Low back pain, unspecified: Secondary | ICD-10-CM | POA: Diagnosis not present

## 2012-07-10 DIAGNOSIS — R0609 Other forms of dyspnea: Secondary | ICD-10-CM | POA: Diagnosis not present

## 2012-07-10 DIAGNOSIS — G4710 Hypersomnia, unspecified: Secondary | ICD-10-CM | POA: Diagnosis not present

## 2012-07-10 DIAGNOSIS — M2560 Stiffness of unspecified joint, not elsewhere classified: Secondary | ICD-10-CM | POA: Diagnosis not present

## 2012-07-10 DIAGNOSIS — R293 Abnormal posture: Secondary | ICD-10-CM | POA: Diagnosis not present

## 2012-07-10 DIAGNOSIS — J310 Chronic rhinitis: Secondary | ICD-10-CM | POA: Diagnosis not present

## 2012-07-10 DIAGNOSIS — R269 Unspecified abnormalities of gait and mobility: Secondary | ICD-10-CM | POA: Diagnosis not present

## 2012-07-10 DIAGNOSIS — M6281 Muscle weakness (generalized): Secondary | ICD-10-CM | POA: Diagnosis not present

## 2012-07-10 DIAGNOSIS — IMO0001 Reserved for inherently not codable concepts without codable children: Secondary | ICD-10-CM | POA: Diagnosis not present

## 2012-07-10 DIAGNOSIS — J984 Other disorders of lung: Secondary | ICD-10-CM | POA: Diagnosis not present

## 2012-07-12 ENCOUNTER — Other Ambulatory Visit: Payer: Self-pay | Admitting: Family Medicine

## 2012-07-13 DIAGNOSIS — M2560 Stiffness of unspecified joint, not elsewhere classified: Secondary | ICD-10-CM | POA: Diagnosis not present

## 2012-07-13 DIAGNOSIS — M545 Low back pain, unspecified: Secondary | ICD-10-CM | POA: Diagnosis not present

## 2012-07-13 DIAGNOSIS — M6281 Muscle weakness (generalized): Secondary | ICD-10-CM | POA: Diagnosis not present

## 2012-07-13 DIAGNOSIS — R269 Unspecified abnormalities of gait and mobility: Secondary | ICD-10-CM | POA: Diagnosis not present

## 2012-07-13 DIAGNOSIS — R293 Abnormal posture: Secondary | ICD-10-CM | POA: Diagnosis not present

## 2012-07-13 DIAGNOSIS — IMO0001 Reserved for inherently not codable concepts without codable children: Secondary | ICD-10-CM | POA: Diagnosis not present

## 2012-07-17 DIAGNOSIS — R293 Abnormal posture: Secondary | ICD-10-CM | POA: Diagnosis not present

## 2012-07-17 DIAGNOSIS — IMO0001 Reserved for inherently not codable concepts without codable children: Secondary | ICD-10-CM | POA: Diagnosis not present

## 2012-07-17 DIAGNOSIS — R269 Unspecified abnormalities of gait and mobility: Secondary | ICD-10-CM | POA: Diagnosis not present

## 2012-07-17 DIAGNOSIS — M545 Low back pain, unspecified: Secondary | ICD-10-CM | POA: Diagnosis not present

## 2012-07-17 DIAGNOSIS — M2560 Stiffness of unspecified joint, not elsewhere classified: Secondary | ICD-10-CM | POA: Diagnosis not present

## 2012-07-17 DIAGNOSIS — M6281 Muscle weakness (generalized): Secondary | ICD-10-CM | POA: Diagnosis not present

## 2012-07-18 ENCOUNTER — Ambulatory Visit (INDEPENDENT_AMBULATORY_CARE_PROVIDER_SITE_OTHER): Payer: Medicare Other | Admitting: Family Medicine

## 2012-07-18 ENCOUNTER — Encounter: Payer: Self-pay | Admitting: Family Medicine

## 2012-07-18 VITALS — BP 150/76 | HR 91 | Temp 98.1°F | Wt 268.6 lb

## 2012-07-18 DIAGNOSIS — IMO0001 Reserved for inherently not codable concepts without codable children: Secondary | ICD-10-CM

## 2012-07-18 DIAGNOSIS — IMO0002 Reserved for concepts with insufficient information to code with codable children: Secondary | ICD-10-CM

## 2012-07-18 DIAGNOSIS — E1165 Type 2 diabetes mellitus with hyperglycemia: Secondary | ICD-10-CM | POA: Diagnosis not present

## 2012-07-18 DIAGNOSIS — F3289 Other specified depressive episodes: Secondary | ICD-10-CM

## 2012-07-18 DIAGNOSIS — I10 Essential (primary) hypertension: Secondary | ICD-10-CM

## 2012-07-18 DIAGNOSIS — E785 Hyperlipidemia, unspecified: Secondary | ICD-10-CM | POA: Diagnosis not present

## 2012-07-18 DIAGNOSIS — F32A Depression, unspecified: Secondary | ICD-10-CM

## 2012-07-18 DIAGNOSIS — D696 Thrombocytopenia, unspecified: Secondary | ICD-10-CM

## 2012-07-18 DIAGNOSIS — F329 Major depressive disorder, single episode, unspecified: Secondary | ICD-10-CM | POA: Diagnosis not present

## 2012-07-18 DIAGNOSIS — E118 Type 2 diabetes mellitus with unspecified complications: Secondary | ICD-10-CM

## 2012-07-18 DIAGNOSIS — E2839 Other primary ovarian failure: Secondary | ICD-10-CM

## 2012-07-18 DIAGNOSIS — K3189 Other diseases of stomach and duodenum: Secondary | ICD-10-CM | POA: Diagnosis not present

## 2012-07-18 DIAGNOSIS — R1013 Epigastric pain: Secondary | ICD-10-CM | POA: Diagnosis not present

## 2012-07-18 DIAGNOSIS — Z1239 Encounter for other screening for malignant neoplasm of breast: Secondary | ICD-10-CM

## 2012-07-18 LAB — LIPID PANEL
Cholesterol: 156 mg/dL (ref 0–200)
HDL: 42.3 mg/dL (ref >39.00)
LDL Cholesterol: 79 mg/dL (ref 0–99)
Total CHOL/HDL Ratio: 4
Triglycerides: 176 mg/dL — ABNORMAL HIGH (ref 0.0–149.0)
VLDL: 35.2 mg/dL (ref 0.0–40.0)

## 2012-07-18 LAB — CBC WITH DIFFERENTIAL/PLATELET
Basophils Absolute: 0 10*3/uL (ref 0.0–0.1)
Basophils Relative: 0.2 % (ref 0.0–3.0)
Eosinophils Absolute: 0.1 10*3/uL (ref 0.0–0.7)
Eosinophils Relative: 1 % (ref 0.0–5.0)
HCT: 32.8 % — ABNORMAL LOW (ref 36.0–46.0)
Hemoglobin: 10.8 g/dL — ABNORMAL LOW (ref 12.0–15.0)
Lymphocytes Relative: 15.4 % (ref 12.0–46.0)
Lymphs Abs: 1.3 10*3/uL (ref 0.7–4.0)
MCHC: 33 g/dL (ref 30.0–36.0)
MCV: 77.8 fl — ABNORMAL LOW (ref 78.0–100.0)
Monocytes Absolute: 0.4 10*3/uL (ref 0.1–1.0)
Monocytes Relative: 4.4 % (ref 3.0–12.0)
Neutro Abs: 6.5 10*3/uL (ref 1.4–7.7)
Neutrophils Relative %: 79 % — ABNORMAL HIGH (ref 43.0–77.0)
Platelets: 288 10*3/uL (ref 150.0–400.0)
RBC: 4.21 Mil/uL (ref 3.87–5.11)
RDW: 22.6 % — ABNORMAL HIGH (ref 11.5–14.6)
WBC: 8.2 10*3/uL (ref 4.5–10.5)

## 2012-07-18 LAB — BASIC METABOLIC PANEL
BUN: 27 mg/dL — ABNORMAL HIGH (ref 6–23)
CO2: 25 mEq/L (ref 19–32)
Calcium: 9.2 mg/dL (ref 8.4–10.5)
Chloride: 101 mEq/L (ref 96–112)
Creatinine, Ser: 1.3 mg/dL — ABNORMAL HIGH (ref 0.4–1.2)
GFR: 42.63 mL/min — ABNORMAL LOW (ref >60.00)
Glucose, Bld: 99 mg/dL (ref 70–99)
Potassium: 4.2 mEq/L (ref 3.5–5.1)
Sodium: 135 mEq/L (ref 135–145)

## 2012-07-18 LAB — POCT URINALYSIS DIPSTICK
Bilirubin, UA: NEGATIVE
Blood, UA: NEGATIVE
Glucose, UA: NEGATIVE
Ketones, UA: NEGATIVE
Leukocytes, UA: NEGATIVE
Nitrite, UA: NEGATIVE
Protein, UA: NEGATIVE
Spec Grav, UA: 1.01
Urobilinogen, UA: 0.2
pH, UA: 6

## 2012-07-18 LAB — HEPATIC FUNCTION PANEL
ALT: 31 U/L (ref 0–35)
AST: 30 U/L (ref 0–37)
Albumin: 4 g/dL (ref 3.5–5.2)
Alkaline Phosphatase: 66 U/L (ref 39–117)
Bilirubin, Direct: 0.1 mg/dL (ref 0.0–0.3)
Total Bilirubin: 0.6 mg/dL (ref 0.3–1.2)
Total Protein: 7.5 g/dL (ref 6.0–8.3)

## 2012-07-18 LAB — MICROALBUMIN / CREATININE URINE RATIO
Creatinine,U: 37 mg/dL
Microalb Creat Ratio: 0.5 mg/g (ref 0.0–30.0)
Microalb, Ur: 0.2 mg/dL (ref 0.0–1.9)

## 2012-07-18 LAB — HEMOGLOBIN A1C: Hgb A1c MFr Bld: 6.6 % — ABNORMAL HIGH (ref 4.6–6.5)

## 2012-07-18 MED ORDER — LISINOPRIL 40 MG PO TABS: 40.0000 mg | ORAL_TABLET | Freq: Every day | ORAL | Status: DC

## 2012-07-18 MED ORDER — GLIPIZIDE 5 MG PO TABS: ORAL_TABLET | ORAL | Status: DC

## 2012-07-18 MED ORDER — ASPIRIN 81 MG PO TBEC: 81.0000 mg | DELAYED_RELEASE_TABLET | Freq: Every day | ORAL | Status: DC

## 2012-07-18 MED ORDER — OMEPRAZOLE 20 MG PO CPDR: 20.0000 mg | DELAYED_RELEASE_CAPSULE | Freq: Every day | ORAL | Status: DC

## 2012-07-18 MED ORDER — SERTRALINE HCL 50 MG PO TABS: 50.0000 mg | ORAL_TABLET | Freq: Every day | ORAL | Status: DC

## 2012-07-18 MED ORDER — FUROSEMIDE 20 MG PO TABS: ORAL_TABLET | ORAL | Status: DC

## 2012-07-18 NOTE — Assessment & Plan Note (Signed)
Check labs today stable 

## 2012-07-18 NOTE — Assessment & Plan Note (Signed)
Stable Cont meds 

## 2012-07-18 NOTE — Assessment & Plan Note (Signed)
Check labs con't meds 

## 2012-07-18 NOTE — Patient Instructions (Signed)

## 2012-07-18 NOTE — Progress Notes (Signed)
  Subjective:    Patient ID: Erin [name], female    DOB: 13-Jan-1946, 67 y.o.   MRN: [id number]  HPI HYPERTENSION Disease Monitoring Blood pressure range-not checking at home Chest pain- no      Dyspnea- no Medications Compliance- good Lightheadedness- no   Edema- no   DIABETES Disease Monitoring Blood Sugar ranges-109-250 Polyuria- no New Visual problems- no Medications Compliance- good Hypoglycemic symptoms- no   HYPERLIPIDEMIA Disease Monitoring See symptoms for Hypertension Medications Compliance- good RUQ pain- no  Muscle aches- no  ROS See HPI above   PMH Smoking Status noted     Review of Systems As above    Objective:   Physical Exam BP 150/76  Pulse 91  Temp(Src) 98.1 F (36.7 C) (Oral)  Wt 268 lb 9.6 oz (121.836 kg)  BMI 46.08 kg/m2  SpO2 97% General appearance: alert, cooperative, appears stated age and morbidly obese Lungs: clear to auscultation bilaterally Heart: S1, S2 normal Extremities: extremities normal, atraumatic, no cyanosis or edema       Assessment & Plan:

## 2012-07-20 DIAGNOSIS — M2560 Stiffness of unspecified joint, not elsewhere classified: Secondary | ICD-10-CM | POA: Diagnosis not present

## 2012-07-20 DIAGNOSIS — R269 Unspecified abnormalities of gait and mobility: Secondary | ICD-10-CM | POA: Diagnosis not present

## 2012-07-20 DIAGNOSIS — M6281 Muscle weakness (generalized): Secondary | ICD-10-CM | POA: Diagnosis not present

## 2012-07-20 DIAGNOSIS — M545 Low back pain, unspecified: Secondary | ICD-10-CM | POA: Diagnosis not present

## 2012-07-20 DIAGNOSIS — R293 Abnormal posture: Secondary | ICD-10-CM | POA: Diagnosis not present

## 2012-07-20 DIAGNOSIS — IMO0001 Reserved for inherently not codable concepts without codable children: Secondary | ICD-10-CM | POA: Diagnosis not present

## 2012-07-27 ENCOUNTER — Encounter: Payer: Self-pay | Admitting: Nurse Practitioner

## 2012-07-27 ENCOUNTER — Telehealth: Payer: Self-pay | Admitting: *Deleted

## 2012-07-27 ENCOUNTER — Ambulatory Visit (INDEPENDENT_AMBULATORY_CARE_PROVIDER_SITE_OTHER): Payer: Medicare Other | Admitting: Nurse Practitioner

## 2012-07-27 VITALS — BP 160/60 | HR 108 | Temp 97.6°F | Ht 64.0 in | Wt 270.0 lb

## 2012-07-27 DIAGNOSIS — D649 Anemia, unspecified: Secondary | ICD-10-CM | POA: Diagnosis not present

## 2012-07-27 DIAGNOSIS — R1013 Epigastric pain: Secondary | ICD-10-CM

## 2012-07-27 DIAGNOSIS — R197 Diarrhea, unspecified: Secondary | ICD-10-CM | POA: Diagnosis not present

## 2012-07-27 NOTE — Patient Instructions (Addendum)
Likely, you have gastroenteritis from a virus or food you have eaten causing your diarrhea. I will know more information once we get your labs back. In the meantime, please eat a full liquid diet for 48 hours. That means do not eat anything that you have to chew. Then progress to a soft diet such as potatoes, rice, melon, bananas, soft bread, etc. I would discourage dairy for a few days, with the exception of yogurt. Please call your doctor if symptoms do not improve or worsen.

## 2012-07-27 NOTE — Telephone Encounter (Signed)
Patient called the office has been nauseous for 2 days. No fever, chills. Patient is diabetic and sugars have been running 84-145. Patient reports good appetite and urine is yellow, encouraged pt to continue fluids as tolerated and rest. Appt scheduled this afternoon at the [facility] office with Maximino Sarin at 3pm.

## 2012-07-27 NOTE — Progress Notes (Signed)
Subjective:    Patient ID: Erin [name], female    DOB: Aug 20, 1945, 67 y.o.   MRN: [id number]  Diarrhea  This is a new (7 days, becoming less frequent and more formed) problem. The current episode started in the past 7 days. The problem occurs 2 to 4 times per day. The problem has been gradually improving. The stool consistency is described as watery (watery when 1st started, now loose). The patient states that diarrhea does not awaken her from sleep. Associated symptoms include abdominal pain (described as diffuse uncomfortable feeling), arthralgias (c/oL knee pain since she has been going to PT), bloating, increased flatus and vomiting (1 episode yesterday). Pertinent negatives include no chills, coughing, fever, headaches (feels lightheaded), myalgias or weight loss. Nothing aggravates the symptoms. There are no known risk factors. She has tried nothing for the symptoms. diabetes, new diagnosis of anemia, COPD, cholecystectomy  Abdominal Pain Associated symptoms include arthralgias (c/oL knee pain since she has been going to PT), diarrhea, flatus and vomiting (1 episode yesterday). Pertinent negatives include no dysuria, fever, frequency, headaches (feels lightheaded), myalgias or weight loss. diabetes, new diagnosis of anemia, COPD, cholecystectomy      Review of Systems  Constitutional: Negative for fever, chills, weight loss, activity change, appetite change, fatigue and unexpected weight change.  HENT: Negative for sore throat.   Respiratory: Positive for shortness of breath (ongoing SOB with activity. No reported change). Negative for cough.   Cardiovascular: Negative for chest pain and leg swelling.  Gastrointestinal: Positive for vomiting (1 episode yesterday), abdominal pain (described as diffuse uncomfortable feeling), diarrhea, bloating and flatus.  Genitourinary: Negative for dysuria, urgency, frequency, flank pain, difficulty urinating and menstrual problem (postmenopausal).   Musculoskeletal: Positive for arthralgias (c/oL knee pain since she has been going to PT). Negative for myalgias.  Skin: Negative for rash.  Allergic/Immunologic: Negative for food allergies.  Neurological: Positive for dizziness (reports lightheaded, denies feeling like room or objects are moving) and light-headedness. Negative for weakness and headaches (feels lightheaded).  Hematological: Negative for adenopathy.       Objective:   Physical Exam  Constitutional: She is oriented to person, place, and time. She appears well-developed and well-nourished.  HENT:  Head: Normocephalic and atraumatic.  Right Ear: External ear normal.  Left Ear: External ear normal. A middle ear effusion is present.  Mouth/Throat: Oropharynx is clear and moist. No oropharyngeal exudate.  Eyes: Conjunctivae are normal.  Neck: Normal range of motion. Neck supple.  Cardiovascular: Normal rate, regular rhythm and normal heart sounds.   No murmur heard. Pulmonary/Chest: Effort normal and breath sounds normal. No respiratory distress. She has no wheezes.  Abdominal: She exhibits no mass. There is tenderness (epigastric). There is guarding. There is no rebound.  High-pitched abd sounds, hypoactive. Pt mentions that she has been working on core-strengthening w/PT and wonders if this may contribute to tenderness to palpation  Musculoskeletal: Normal range of motion. She exhibits tenderness (tender below knee at medial proximal tibia). She exhibits no edema.  Lymphadenopathy:    She has no cervical adenopathy (difficult to examine due to body habitus).  Neurological: She is alert and oriented to person, place, and time.  Skin: Skin is warm and dry.  Psychiatric: She has a normal mood and affect. Her behavior is normal. Thought content normal.          Assessment & Plan:  1)Diarrhea:full liquid diet for 48 hours, then progress to soft diet if tolerates. Check cmet today) 2)epigastric tenderness, DD: PUD-NSAID  use twice daily times 1 year, MSK r/t new exercises, pancreatitis 3)new Dx of anemia: check cbc today and iron panel w/ferritin. Pt will be called if abnormal labs & scheduled for f/u appt. Otherwise will  Call if no improvement or worsening symptoms.

## 2012-07-28 LAB — CBC WITH DIFFERENTIAL/PLATELET
Basophils Absolute: 0 10*3/uL (ref 0.0–0.1)
Basophils Relative: 0 % (ref 0–1)
Eosinophils Absolute: 0.1 10*3/uL (ref 0.0–0.7)
Eosinophils Relative: 1 % (ref 0–5)
HCT: 31 % — ABNORMAL LOW (ref 36.0–46.0)
Hemoglobin: 10.1 g/dL — ABNORMAL LOW (ref 12.0–15.0)
Lymphocytes Relative: 13 % (ref 12–46)
Lymphs Abs: 1.3 10*3/uL (ref 0.7–4.0)
MCH: 25.3 pg — ABNORMAL LOW (ref 26.0–34.0)
MCHC: 32.6 g/dL (ref 30.0–36.0)
MCV: 77.5 fL — ABNORMAL LOW (ref 78.0–100.0)
Monocytes Absolute: 0.5 10*3/uL (ref 0.1–1.0)
Monocytes Relative: 5 % (ref 3–12)
Neutro Abs: 8.1 10*3/uL — ABNORMAL HIGH (ref 1.7–7.7)
Neutrophils Relative %: 81 % — ABNORMAL HIGH (ref 43–77)
Platelets: 272 10*3/uL (ref 150–400)
RBC: 4 MIL/uL (ref 3.87–5.11)
RDW: 21.5 % — ABNORMAL HIGH (ref 11.5–15.5)
WBC: 10 10*3/uL (ref 4.0–10.5)

## 2012-07-28 LAB — IBC PANEL
%SAT: 12 % — ABNORMAL LOW (ref 20–55)
TIBC: 362 ug/dL (ref 250–470)
UIBC: 317 ug/dL (ref 125–400)

## 2012-07-28 LAB — COMPREHENSIVE METABOLIC PANEL
ALT: 26 U/L (ref 0–35)
AST: 22 U/L (ref 0–37)
Albumin: 4 g/dL (ref 3.5–5.2)
Alkaline Phosphatase: 74 U/L (ref 39–117)
BUN: 25 mg/dL — ABNORMAL HIGH (ref 6–23)
CO2: 23 mEq/L (ref 19–32)
Calcium: 9.1 mg/dL (ref 8.4–10.5)
Chloride: 102 mEq/L (ref 96–112)
Creat: 1.15 mg/dL — ABNORMAL HIGH (ref 0.50–1.10)
Glucose, Bld: 131 mg/dL — ABNORMAL HIGH (ref 70–99)
Potassium: 4.1 mEq/L (ref 3.5–5.3)
Sodium: 136 mEq/L (ref 135–145)
Total Bilirubin: 0.4 mg/dL (ref 0.3–1.2)
Total Protein: 6.9 g/dL (ref 6.0–8.3)

## 2012-07-28 LAB — IRON: Iron: 45 ug/dL (ref 42–145)

## 2012-07-28 LAB — FERRITIN: Ferritin: 44 ng/mL (ref 10–291)

## 2012-07-28 NOTE — Progress Notes (Signed)
Quick Note:  DR. Laury Axon, I saw this pt 4/ 42 for diarrhea times 7 days and vomit times 1. Diarrhea improving. CBC showed slight drop from 1 week ago. Cmet unchanged. Please see iron panel. Looks like she is not due for colonoscopy, but last one was a few years ago. ______

## 2012-07-31 ENCOUNTER — Other Ambulatory Visit: Payer: Self-pay

## 2012-07-31 DIAGNOSIS — Z1231 Encounter for screening mammogram for malignant neoplasm of breast: Secondary | ICD-10-CM | POA: Diagnosis not present

## 2012-07-31 DIAGNOSIS — E785 Hyperlipidemia, unspecified: Secondary | ICD-10-CM

## 2012-07-31 DIAGNOSIS — E119 Type 2 diabetes mellitus without complications: Secondary | ICD-10-CM

## 2012-07-31 DIAGNOSIS — D649 Anemia, unspecified: Secondary | ICD-10-CM

## 2012-07-31 DIAGNOSIS — Z13820 Encounter for screening for osteoporosis: Secondary | ICD-10-CM | POA: Diagnosis not present

## 2012-08-01 DIAGNOSIS — R269 Unspecified abnormalities of gait and mobility: Secondary | ICD-10-CM | POA: Diagnosis not present

## 2012-08-01 DIAGNOSIS — M545 Low back pain, unspecified: Secondary | ICD-10-CM | POA: Diagnosis not present

## 2012-08-01 DIAGNOSIS — R293 Abnormal posture: Secondary | ICD-10-CM | POA: Diagnosis not present

## 2012-08-01 DIAGNOSIS — M6281 Muscle weakness (generalized): Secondary | ICD-10-CM | POA: Diagnosis not present

## 2012-08-01 DIAGNOSIS — IMO0001 Reserved for inherently not codable concepts without codable children: Secondary | ICD-10-CM | POA: Diagnosis not present

## 2012-08-01 DIAGNOSIS — M2560 Stiffness of unspecified joint, not elsewhere classified: Secondary | ICD-10-CM | POA: Diagnosis not present

## 2012-08-04 DIAGNOSIS — R293 Abnormal posture: Secondary | ICD-10-CM | POA: Diagnosis not present

## 2012-08-04 DIAGNOSIS — IMO0001 Reserved for inherently not codable concepts without codable children: Secondary | ICD-10-CM | POA: Diagnosis not present

## 2012-08-04 DIAGNOSIS — M545 Low back pain, unspecified: Secondary | ICD-10-CM | POA: Diagnosis not present

## 2012-08-04 DIAGNOSIS — M6281 Muscle weakness (generalized): Secondary | ICD-10-CM | POA: Diagnosis not present

## 2012-08-04 DIAGNOSIS — M2560 Stiffness of unspecified joint, not elsewhere classified: Secondary | ICD-10-CM | POA: Diagnosis not present

## 2012-08-04 DIAGNOSIS — R269 Unspecified abnormalities of gait and mobility: Secondary | ICD-10-CM | POA: Diagnosis not present

## 2012-08-07 DIAGNOSIS — M25569 Pain in unspecified knee: Secondary | ICD-10-CM | POA: Diagnosis not present

## 2012-08-07 DIAGNOSIS — M545 Low back pain, unspecified: Secondary | ICD-10-CM | POA: Diagnosis not present

## 2012-08-08 ENCOUNTER — Other Ambulatory Visit: Payer: Medicare Other

## 2012-08-09 DIAGNOSIS — M6281 Muscle weakness (generalized): Secondary | ICD-10-CM | POA: Diagnosis not present

## 2012-08-09 DIAGNOSIS — M545 Low back pain, unspecified: Secondary | ICD-10-CM | POA: Diagnosis not present

## 2012-08-09 DIAGNOSIS — IMO0001 Reserved for inherently not codable concepts without codable children: Secondary | ICD-10-CM | POA: Diagnosis not present

## 2012-08-09 DIAGNOSIS — M2560 Stiffness of unspecified joint, not elsewhere classified: Secondary | ICD-10-CM | POA: Diagnosis not present

## 2012-08-09 DIAGNOSIS — R269 Unspecified abnormalities of gait and mobility: Secondary | ICD-10-CM | POA: Diagnosis not present

## 2012-08-09 DIAGNOSIS — R293 Abnormal posture: Secondary | ICD-10-CM | POA: Diagnosis not present

## 2012-08-11 DIAGNOSIS — M545 Low back pain, unspecified: Secondary | ICD-10-CM | POA: Diagnosis not present

## 2012-08-11 DIAGNOSIS — M2560 Stiffness of unspecified joint, not elsewhere classified: Secondary | ICD-10-CM | POA: Diagnosis not present

## 2012-08-11 DIAGNOSIS — M6281 Muscle weakness (generalized): Secondary | ICD-10-CM | POA: Diagnosis not present

## 2012-08-11 DIAGNOSIS — R293 Abnormal posture: Secondary | ICD-10-CM | POA: Diagnosis not present

## 2012-08-11 DIAGNOSIS — R269 Unspecified abnormalities of gait and mobility: Secondary | ICD-10-CM | POA: Diagnosis not present

## 2012-08-11 DIAGNOSIS — IMO0001 Reserved for inherently not codable concepts without codable children: Secondary | ICD-10-CM | POA: Diagnosis not present

## 2012-08-17 DIAGNOSIS — M2560 Stiffness of unspecified joint, not elsewhere classified: Secondary | ICD-10-CM | POA: Diagnosis not present

## 2012-08-17 DIAGNOSIS — IMO0001 Reserved for inherently not codable concepts without codable children: Secondary | ICD-10-CM | POA: Diagnosis not present

## 2012-08-17 DIAGNOSIS — R293 Abnormal posture: Secondary | ICD-10-CM | POA: Diagnosis not present

## 2012-08-17 DIAGNOSIS — M545 Low back pain, unspecified: Secondary | ICD-10-CM | POA: Diagnosis not present

## 2012-08-17 DIAGNOSIS — R269 Unspecified abnormalities of gait and mobility: Secondary | ICD-10-CM | POA: Diagnosis not present

## 2012-08-17 DIAGNOSIS — M6281 Muscle weakness (generalized): Secondary | ICD-10-CM | POA: Diagnosis not present

## 2012-08-23 ENCOUNTER — Encounter: Payer: Self-pay | Admitting: Family Medicine

## 2012-08-23 DIAGNOSIS — R269 Unspecified abnormalities of gait and mobility: Secondary | ICD-10-CM | POA: Diagnosis not present

## 2012-08-23 DIAGNOSIS — M545 Low back pain, unspecified: Secondary | ICD-10-CM | POA: Diagnosis not present

## 2012-08-23 DIAGNOSIS — M2560 Stiffness of unspecified joint, not elsewhere classified: Secondary | ICD-10-CM | POA: Diagnosis not present

## 2012-08-23 DIAGNOSIS — M6281 Muscle weakness (generalized): Secondary | ICD-10-CM | POA: Diagnosis not present

## 2012-08-23 DIAGNOSIS — R293 Abnormal posture: Secondary | ICD-10-CM | POA: Diagnosis not present

## 2012-08-23 DIAGNOSIS — IMO0001 Reserved for inherently not codable concepts without codable children: Secondary | ICD-10-CM | POA: Diagnosis not present

## 2012-08-31 ENCOUNTER — Ambulatory Visit: Payer: Medicare Other | Admitting: Family Medicine

## 2012-09-04 ENCOUNTER — Encounter: Payer: Self-pay | Admitting: Family Medicine

## 2012-09-05 DIAGNOSIS — M545 Low back pain, unspecified: Secondary | ICD-10-CM | POA: Diagnosis not present

## 2012-09-05 DIAGNOSIS — M25569 Pain in unspecified knee: Secondary | ICD-10-CM | POA: Diagnosis not present

## 2012-09-08 DIAGNOSIS — IMO0001 Reserved for inherently not codable concepts without codable children: Secondary | ICD-10-CM | POA: Diagnosis not present

## 2012-09-08 DIAGNOSIS — M2560 Stiffness of unspecified joint, not elsewhere classified: Secondary | ICD-10-CM | POA: Diagnosis not present

## 2012-09-08 DIAGNOSIS — M545 Low back pain, unspecified: Secondary | ICD-10-CM | POA: Diagnosis not present

## 2012-09-08 DIAGNOSIS — R269 Unspecified abnormalities of gait and mobility: Secondary | ICD-10-CM | POA: Diagnosis not present

## 2012-09-08 DIAGNOSIS — R293 Abnormal posture: Secondary | ICD-10-CM | POA: Diagnosis not present

## 2012-09-08 DIAGNOSIS — M6281 Muscle weakness (generalized): Secondary | ICD-10-CM | POA: Diagnosis not present

## 2012-09-12 DIAGNOSIS — M25569 Pain in unspecified knee: Secondary | ICD-10-CM | POA: Diagnosis not present

## 2012-09-12 DIAGNOSIS — M1710 Unilateral primary osteoarthritis, unspecified knee: Secondary | ICD-10-CM | POA: Diagnosis not present

## 2012-09-12 DIAGNOSIS — IMO0002 Reserved for concepts with insufficient information to code with codable children: Secondary | ICD-10-CM | POA: Diagnosis not present

## 2012-09-15 DIAGNOSIS — R293 Abnormal posture: Secondary | ICD-10-CM | POA: Diagnosis not present

## 2012-09-15 DIAGNOSIS — M545 Low back pain, unspecified: Secondary | ICD-10-CM | POA: Diagnosis not present

## 2012-09-15 DIAGNOSIS — IMO0001 Reserved for inherently not codable concepts without codable children: Secondary | ICD-10-CM | POA: Diagnosis not present

## 2012-09-15 DIAGNOSIS — R269 Unspecified abnormalities of gait and mobility: Secondary | ICD-10-CM | POA: Diagnosis not present

## 2012-09-15 DIAGNOSIS — M6281 Muscle weakness (generalized): Secondary | ICD-10-CM | POA: Diagnosis not present

## 2012-09-15 DIAGNOSIS — M2560 Stiffness of unspecified joint, not elsewhere classified: Secondary | ICD-10-CM | POA: Diagnosis not present

## 2012-09-21 DIAGNOSIS — M545 Low back pain, unspecified: Secondary | ICD-10-CM | POA: Diagnosis not present

## 2012-09-21 DIAGNOSIS — R293 Abnormal posture: Secondary | ICD-10-CM | POA: Diagnosis not present

## 2012-09-21 DIAGNOSIS — M6281 Muscle weakness (generalized): Secondary | ICD-10-CM | POA: Diagnosis not present

## 2012-09-21 DIAGNOSIS — R269 Unspecified abnormalities of gait and mobility: Secondary | ICD-10-CM | POA: Diagnosis not present

## 2012-09-21 DIAGNOSIS — M2560 Stiffness of unspecified joint, not elsewhere classified: Secondary | ICD-10-CM | POA: Diagnosis not present

## 2012-09-21 DIAGNOSIS — IMO0001 Reserved for inherently not codable concepts without codable children: Secondary | ICD-10-CM | POA: Diagnosis not present

## 2012-09-29 DIAGNOSIS — R293 Abnormal posture: Secondary | ICD-10-CM | POA: Diagnosis not present

## 2012-09-29 DIAGNOSIS — M2560 Stiffness of unspecified joint, not elsewhere classified: Secondary | ICD-10-CM | POA: Diagnosis not present

## 2012-09-29 DIAGNOSIS — R269 Unspecified abnormalities of gait and mobility: Secondary | ICD-10-CM | POA: Diagnosis not present

## 2012-09-29 DIAGNOSIS — M545 Low back pain, unspecified: Secondary | ICD-10-CM | POA: Diagnosis not present

## 2012-09-29 DIAGNOSIS — M6281 Muscle weakness (generalized): Secondary | ICD-10-CM | POA: Diagnosis not present

## 2012-09-29 DIAGNOSIS — IMO0001 Reserved for inherently not codable concepts without codable children: Secondary | ICD-10-CM | POA: Diagnosis not present

## 2012-10-04 DIAGNOSIS — M2560 Stiffness of unspecified joint, not elsewhere classified: Secondary | ICD-10-CM | POA: Diagnosis not present

## 2012-10-04 DIAGNOSIS — M6281 Muscle weakness (generalized): Secondary | ICD-10-CM | POA: Diagnosis not present

## 2012-10-04 DIAGNOSIS — R269 Unspecified abnormalities of gait and mobility: Secondary | ICD-10-CM | POA: Diagnosis not present

## 2012-10-04 DIAGNOSIS — IMO0001 Reserved for inherently not codable concepts without codable children: Secondary | ICD-10-CM | POA: Diagnosis not present

## 2012-10-04 DIAGNOSIS — M545 Low back pain, unspecified: Secondary | ICD-10-CM | POA: Diagnosis not present

## 2012-10-04 DIAGNOSIS — R293 Abnormal posture: Secondary | ICD-10-CM | POA: Diagnosis not present

## 2012-10-07 DIAGNOSIS — R42 Dizziness and giddiness: Secondary | ICD-10-CM | POA: Diagnosis not present

## 2012-10-07 DIAGNOSIS — D649 Anemia, unspecified: Secondary | ICD-10-CM | POA: Diagnosis not present

## 2012-10-07 DIAGNOSIS — R7989 Other specified abnormal findings of blood chemistry: Secondary | ICD-10-CM | POA: Diagnosis not present

## 2012-10-09 DIAGNOSIS — R5383 Other fatigue: Secondary | ICD-10-CM | POA: Diagnosis not present

## 2012-10-09 DIAGNOSIS — R42 Dizziness and giddiness: Secondary | ICD-10-CM | POA: Diagnosis not present

## 2012-10-09 DIAGNOSIS — R112 Nausea with vomiting, unspecified: Secondary | ICD-10-CM | POA: Diagnosis not present

## 2012-10-09 DIAGNOSIS — R5381 Other malaise: Secondary | ICD-10-CM | POA: Diagnosis not present

## 2012-10-12 ENCOUNTER — Other Ambulatory Visit: Payer: Self-pay

## 2012-10-13 ENCOUNTER — Encounter: Payer: Self-pay | Admitting: Family Medicine

## 2012-10-13 ENCOUNTER — Ambulatory Visit (INDEPENDENT_AMBULATORY_CARE_PROVIDER_SITE_OTHER): Payer: Medicare Other | Admitting: Family Medicine

## 2012-10-13 DIAGNOSIS — H6590 Unspecified nonsuppurative otitis media, unspecified ear: Secondary | ICD-10-CM

## 2012-10-13 MED ORDER — PREDNISONE 10 MG PO TABS: ORAL_TABLET | ORAL | Status: DC

## 2012-10-13 MED ORDER — AMOXICILLIN 875 MG PO TABS: 875.0000 mg | ORAL_TABLET | Freq: Two times a day (BID) | ORAL | Status: DC

## 2012-10-13 NOTE — Progress Notes (Signed)
  Subjective:     Erin [name] is a 67 y.o. female who presents for evaluation of dizziness. The symptoms started 2 weeks ago and are ongoing. The attacks occur daily and last all day  -. Positions that worsen symptoms: any motion. Previous workup/treatments: none. Associated ear symptoms: otalgia. Associated CNS symptoms: none. Recent infections: none. Head trauma: denied. Drug ingestion: none. Noise exposure: no occupational exposure. Family history: non-contributory.  The following portions of the patient's history were reviewed and updated as appropriate: allergies, current medications, past family history, past medical history, past social history, past surgical history and problem list.  Review of Systems Pertinent items are noted in HPI.    Objective:    BP 160/80  Pulse 104  Temp(Src) 97.9 F (36.6 C) (Oral)  Wt 262 lb 12.8 oz (119.205 kg)  BMI 45.09 kg/m2  SpO2 95% General appearance: alert, cooperative, appears stated age and no distress Ears: +fluid b/l no infection Nose: Nares normal. Septum midline. Mucosa normal. No drainage or sinus tenderness. Throat: lips, mucosa, and tongue normal; teeth and gums normal Neck: no adenopathy, no carotid bruit, no JVD, supple, symmetrical, trachea midline and thyroid not enlarged, symmetric, no tenderness/mass/nodules Lungs: clear to auscultation bilaterally Heart: S1, S2 normal      Assessment:    Vertigo    Plan:    Meclizine per medication orders. Steroids per medication orders. Antiemetic per medication orders. f/u prn --consider ent if no better

## 2012-10-13 NOTE — Patient Instructions (Addendum)
Serous Otitis Media   Serous otitis media is also known as otitis media with effusion (OME). It means there is fluid in the middle ear space. This space contains the bones for hearing and air. Air in the middle ear space helps to transmit sound.   The air gets there through the eustachian tube. This tube goes from the back of the throat to the middle ear space. It keeps the pressure in the middle ear the same as the outside world. It also helps to drain fluid from the middle ear space.  CAUSES   OME occurs when the eustachian tube gets blocked. Blockage can come from:  · Ear infections.  · Colds and other upper respiratory infections.  · Allergies.  · Irritants such as cigarette smoke.  · Sudden changes in air pressure (such as descending in an airplane).  · Enlarged adenoids.  During colds and upper respiratory infections, the middle ear space can become temporarily filled with fluid. This can happen after an ear infection also. Once the infection clears, the fluid will generally drain out of the ear through the eustachian tube. If it does not, then OME occurs.  SYMPTOMS   · Hearing loss.  · A feeling of fullness in the ear  but no pain.  · Young children may not show any symptoms.  DIAGNOSIS   · Diagnosis of OME is made by an ear exam.  · Tests may be done to check on the movement of the eardrum.  · Hearing exams may be done.  TREATMENT   · The fluid most often goes away without treatment.  · If allergy is the cause, allergy treatment may be helpful.  · Fluid that persists for several months may require minor surgery. A small tube is placed in the ear drum to:  · Drain the fluid.  · Restore the air in the middle ear space.  · In certain situations, antibiotics are used to avoid surgery.  · Surgery may be done to remove enlarged adenoids (if this is the cause).  HOME CARE INSTRUCTIONS   · Keep children away from tobacco smoke.  · Be sure to keep follow up appointments, if any.  SEEK MEDICAL CARE IF:   · Hearing is  not better in 3 months.  · Hearing is worse.  · Ear pain.  · Drainage from the ear.  · Dizziness.  Document Released: 06/12/2003 Document Revised: 06/14/2011 Document Reviewed: 04/11/2008  ExitCare® Patient Information ©2014 [employer].

## 2012-10-17 ENCOUNTER — Other Ambulatory Visit: Payer: Self-pay | Admitting: Family Medicine

## 2012-10-19 ENCOUNTER — Telehealth: Payer: Self-pay | Admitting: Family Medicine

## 2012-10-19 DIAGNOSIS — R42 Dizziness and giddiness: Secondary | ICD-10-CM

## 2012-10-19 DIAGNOSIS — H6590 Unspecified nonsuppurative otitis media, unspecified ear: Secondary | ICD-10-CM

## 2012-10-19 NOTE — Telephone Encounter (Signed)
If symptoms same-- refer to ENT

## 2012-10-19 NOTE — Telephone Encounter (Signed)
Patient Information:  Caller Name: Nickayla  Phone: [phone]  Patient: Erin [name], Erin [name]  Gender: Female  DOB: 09-10-1945  Age: 67 Years  PCP: Lelon Perla.  Office Follow Up:  Does the office need to follow up with this patient?: Yes  Instructions For The Office: provider review/referral/callback krs/can  RN Note:  Seen by Dr. Laury Axon 10/13/12 and diagnosed with vertigo, and told to call back if not improving.  States has ear congestion, dizziness.  Per Epic, notes states she had serous OM, and was ordered steroids, antiemetic and meclazine; would consider ENT consult if no better.  Afebrile.  States her blood sugars are stable under 250.  Per dizziness protocol, emergent symptoms denied; info to office per protocol for provider review/? referral/callback.  May reach patient at [phone].  krs/can  Symptoms  Reason For Call & Symptoms: dizziness  Reviewed Health History In EMR: Yes  Reviewed Medications In EMR: Yes  Reviewed Allergies In EMR: Yes  Reviewed Surgeries / Procedures: Yes  Date of Onset of Symptoms: 09/28/2012  Guideline(s) Used:  Dizziness  Disposition Per Guideline:   Discuss with PCP and Callback by Nurse Today  Reason For Disposition Reached:   Diabetes  Advice Given:  N/A  Patient Will Follow Care Advice:  YES

## 2012-10-19 NOTE — Telephone Encounter (Signed)
Patient has been made aware of recommendation and referral put in.     KP

## 2012-10-24 DIAGNOSIS — R0989 Other specified symptoms and signs involving the circulatory and respiratory systems: Secondary | ICD-10-CM | POA: Diagnosis not present

## 2012-10-24 DIAGNOSIS — IMO0002 Reserved for concepts with insufficient information to code with codable children: Secondary | ICD-10-CM | POA: Diagnosis not present

## 2012-10-24 DIAGNOSIS — J984 Other disorders of lung: Secondary | ICD-10-CM | POA: Diagnosis not present

## 2012-10-24 DIAGNOSIS — R0609 Other forms of dyspnea: Secondary | ICD-10-CM | POA: Diagnosis not present

## 2012-10-24 DIAGNOSIS — G4710 Hypersomnia, unspecified: Secondary | ICD-10-CM | POA: Diagnosis not present

## 2012-10-24 DIAGNOSIS — J310 Chronic rhinitis: Secondary | ICD-10-CM | POA: Diagnosis not present

## 2012-10-24 DIAGNOSIS — M1710 Unilateral primary osteoarthritis, unspecified knee: Secondary | ICD-10-CM | POA: Diagnosis not present

## 2012-10-25 ENCOUNTER — Telehealth: Payer: Self-pay | Admitting: *Deleted

## 2012-10-25 DIAGNOSIS — H93299 Other abnormal auditory perceptions, unspecified ear: Secondary | ICD-10-CM | POA: Diagnosis not present

## 2012-10-25 DIAGNOSIS — H9120 Sudden idiopathic hearing loss, unspecified ear: Secondary | ICD-10-CM | POA: Diagnosis not present

## 2012-10-25 DIAGNOSIS — R42 Dizziness and giddiness: Secondary | ICD-10-CM | POA: Diagnosis not present

## 2012-10-25 DIAGNOSIS — E119 Type 2 diabetes mellitus without complications: Secondary | ICD-10-CM | POA: Diagnosis not present

## 2012-10-25 NOTE — Telephone Encounter (Addendum)
Received call from Dr. Jenne Pane (ENT) who spoke with Dr.  Beverely Low regarding the pt been put on high dose oral steroids.  Called the pt and informed her that Dr. Jenne Pane called and spoke with Dr. Beverely Low and since she was started on the high dose of steroids she would like for her to increase her Glipizide to 10mg  BID, check her blood sugars, and eat low carb diet.  Pt understood and agreed.  Also informed the pt that Dr. Beverely Low would like to see her for an appt next week.  Pt stated that she has an appt on Monday(10-30-12) with Dr. Laury Axon.  Checked the appts and the pt is scheduled to have labs done on Mon(10-30-12).   Informed the pt that her appt on Monday(10-30-12) is not with Dr. Laury Axon but for lab work only, we can changed that appt and make her an appt with Dr. Beverely Low the same day but at a different time.  Pt agreed.  Pt was scheduled for 9:15am with Dr Beverely Low on Monday(10-30-12).//AB/CMA

## 2012-10-30 ENCOUNTER — Other Ambulatory Visit: Payer: Medicare Other

## 2012-10-30 ENCOUNTER — Ambulatory Visit: Payer: Medicare Other | Admitting: Family Medicine

## 2012-11-02 ENCOUNTER — Other Ambulatory Visit (INDEPENDENT_AMBULATORY_CARE_PROVIDER_SITE_OTHER): Payer: Medicare Other

## 2012-11-02 ENCOUNTER — Other Ambulatory Visit: Payer: Self-pay | Admitting: *Deleted

## 2012-11-02 DIAGNOSIS — E119 Type 2 diabetes mellitus without complications: Secondary | ICD-10-CM

## 2012-11-02 DIAGNOSIS — D649 Anemia, unspecified: Secondary | ICD-10-CM

## 2012-11-02 DIAGNOSIS — E785 Hyperlipidemia, unspecified: Secondary | ICD-10-CM | POA: Diagnosis not present

## 2012-11-02 LAB — CBC WITH DIFFERENTIAL/PLATELET
Basophils Absolute: 0 10*3/uL (ref 0.0–0.1)
Basophils Relative: 0.4 % (ref 0.0–3.0)
Eosinophils Absolute: 0.1 10*3/uL (ref 0.0–0.7)
Eosinophils Relative: 0.9 % (ref 0.0–5.0)
HCT: 31.4 % — ABNORMAL LOW (ref 36.0–46.0)
Hemoglobin: 10.3 g/dL — ABNORMAL LOW (ref 12.0–15.0)
Lymphocytes Relative: 15.9 % (ref 12.0–46.0)
Lymphs Abs: 1.3 10*3/uL (ref 0.7–4.0)
MCHC: 32.6 g/dL (ref 30.0–36.0)
MCV: 81.7 fl (ref 78.0–100.0)
Monocytes Absolute: 0.4 10*3/uL (ref 0.1–1.0)
Monocytes Relative: 4.9 % (ref 3.0–12.0)
Neutro Abs: 6.2 10*3/uL (ref 1.4–7.7)
Neutrophils Relative %: 77.9 % — ABNORMAL HIGH (ref 43.0–77.0)
Platelets: 240 10*3/uL (ref 150.0–400.0)
RBC: 3.85 Mil/uL — ABNORMAL LOW (ref 3.87–5.11)
RDW: 22.8 % — ABNORMAL HIGH (ref 11.5–14.6)
WBC: 7.9 10*3/uL (ref 4.5–10.5)

## 2012-11-02 LAB — HEPATIC FUNCTION PANEL
ALT: 35 U/L (ref 0–35)
AST: 23 U/L (ref 0–37)
Albumin: 3.9 g/dL (ref 3.5–5.2)
Alkaline Phosphatase: 63 U/L (ref 39–117)
Bilirubin, Direct: 0.1 mg/dL (ref 0.0–0.3)
Total Bilirubin: 0.6 mg/dL (ref 0.3–1.2)
Total Protein: 7.1 g/dL (ref 6.0–8.3)

## 2012-11-02 LAB — LIPID PANEL
Cholesterol: 159 mg/dL (ref 0–200)
HDL: 44.7 mg/dL (ref >39.00)
LDL Cholesterol: 81 mg/dL (ref 0–99)
Total CHOL/HDL Ratio: 4
Triglycerides: 168 mg/dL — ABNORMAL HIGH (ref 0.0–149.0)
VLDL: 33.6 mg/dL (ref 0.0–40.0)

## 2012-11-02 LAB — BASIC METABOLIC PANEL
BUN: 20 mg/dL (ref 6–23)
CO2: 24 mEq/L (ref 19–32)
Calcium: 9.4 mg/dL (ref 8.4–10.5)
Chloride: 103 mEq/L (ref 96–112)
Creatinine, Ser: 1 mg/dL (ref 0.4–1.2)
GFR: 56.72 mL/min — ABNORMAL LOW (ref >60.00)
Glucose, Bld: 106 mg/dL — ABNORMAL HIGH (ref 70–99)
Potassium: 4.2 mEq/L (ref 3.5–5.1)
Sodium: 138 mEq/L (ref 135–145)

## 2012-11-02 LAB — HEMOGLOBIN A1C: Hgb A1c MFr Bld: 6.7 % — ABNORMAL HIGH (ref 4.6–6.5)

## 2012-11-02 MED ORDER — PRAMIPEXOLE DIHYDROCHLORIDE 1 MG PO TABS: 1.0000 mg | ORAL_TABLET | Freq: Every day | ORAL | Status: DC

## 2012-11-02 MED ORDER — GLIPIZIDE 5 MG PO TABS: ORAL_TABLET | ORAL | Status: DC

## 2012-11-02 NOTE — Telephone Encounter (Signed)
Rx's sent to the pharmacy by e-script.//AB/CMA 

## 2012-11-02 NOTE — Addendum Note (Signed)
Addended by: Silvio Pate D on: 11/02/2012 09:35 AM   Modules accepted: Orders

## 2012-11-02 NOTE — Progress Notes (Signed)
This encounter was created in error - please disregard.

## 2012-11-03 DIAGNOSIS — H9120 Sudden idiopathic hearing loss, unspecified ear: Secondary | ICD-10-CM | POA: Diagnosis not present

## 2012-11-07 ENCOUNTER — Telehealth: Payer: Self-pay

## 2012-11-07 DIAGNOSIS — H9120 Sudden idiopathic hearing loss, unspecified ear: Secondary | ICD-10-CM | POA: Diagnosis not present

## 2012-11-07 MED ORDER — GLIPIZIDE 5 MG PO TABS: ORAL_TABLET | ORAL | Status: DC

## 2012-11-07 NOTE — Telephone Encounter (Signed)
Message copied by Arnette Norris on Tue Nov 07, 2012  9:01 AM ------      Message from: Lelon Perla      Created: Sat Nov 04, 2012  2:33 PM       Increase glucotrol 5 mg in am and 10 mg in pm       #90  3 refills      Recheck 3 months      con't fenofibrate and statin      272.4  250.00  Lipid, hep, bmp, hgba1c,       Kidney function slightly elevated       If worsens --- we will refer to nephrology ------

## 2012-11-07 NOTE — Telephone Encounter (Signed)
Discussed with patient and she voiced understanding. Rx sent and copy mailed       KP

## 2012-11-09 DIAGNOSIS — H9120 Sudden idiopathic hearing loss, unspecified ear: Secondary | ICD-10-CM | POA: Diagnosis not present

## 2012-11-10 DIAGNOSIS — R05 Cough: Secondary | ICD-10-CM | POA: Diagnosis not present

## 2012-11-10 DIAGNOSIS — J0100 Acute maxillary sinusitis, unspecified: Secondary | ICD-10-CM | POA: Diagnosis not present

## 2012-11-10 DIAGNOSIS — R059 Cough, unspecified: Secondary | ICD-10-CM | POA: Diagnosis not present

## 2012-11-13 DIAGNOSIS — H9120 Sudden idiopathic hearing loss, unspecified ear: Secondary | ICD-10-CM | POA: Diagnosis not present

## 2012-11-14 DIAGNOSIS — J310 Chronic rhinitis: Secondary | ICD-10-CM | POA: Diagnosis not present

## 2012-11-15 DIAGNOSIS — IMO0002 Reserved for concepts with insufficient information to code with codable children: Secondary | ICD-10-CM | POA: Diagnosis not present

## 2012-11-15 DIAGNOSIS — M1710 Unilateral primary osteoarthritis, unspecified knee: Secondary | ICD-10-CM | POA: Diagnosis not present

## 2012-11-21 DIAGNOSIS — IMO0002 Reserved for concepts with insufficient information to code with codable children: Secondary | ICD-10-CM | POA: Diagnosis not present

## 2012-11-21 DIAGNOSIS — M1710 Unilateral primary osteoarthritis, unspecified knee: Secondary | ICD-10-CM | POA: Diagnosis not present

## 2012-11-22 ENCOUNTER — Encounter: Payer: Self-pay | Admitting: Internal Medicine

## 2012-11-22 ENCOUNTER — Ambulatory Visit (INDEPENDENT_AMBULATORY_CARE_PROVIDER_SITE_OTHER): Payer: Medicare Other | Admitting: Internal Medicine

## 2012-11-22 VITALS — BP 140/80 | HR 105 | Temp 97.5°F | Resp 16 | Wt 274.0 lb

## 2012-11-22 DIAGNOSIS — R609 Edema, unspecified: Secondary | ICD-10-CM

## 2012-11-22 NOTE — Progress Notes (Signed)
Subjective:    Patient ID: Erin [name], female    DOB: April 08, 1945, 67 y.o.   MRN: [id number]  HPI  Pt presents to the clinic today with c/o swelling in her legs. This started about 1 week ago. The swelling is better in the morning but gets worse throughout the day.  She has also noticed her legs draining some clear fluid. This concerns her the most. She is on lasix 20 mg daily. She used to be on 40 mg but ended up having elevated liver functions, so she was cut back down to 30 mg. She has compression hose but doesn't wear them. She has been on her feet this week more than usual.  Review of Systems  Past Medical History  Diagnosis Date  . Depression   . Hyperlipidemia     Current Outpatient Prescriptions  Medication Sig Dispense Refill  . albuterol (PROVENTIL) (2.5 MG/3ML) 0.083% nebulizer solution Take 2.5 mg by nebulization every 6 (six) hours as needed for wheezing.      Marland Kitchen ALPRAZolam (XANAX) 0.25 MG tablet As directed before procedure  20 tablet  0  . amoxicillin (AMOXIL) 875 MG tablet Take 1 tablet (875 mg total) by mouth 2 (two) times daily.  20 tablet  0  . atorvastatin (LIPITOR) 20 MG tablet Take 1 tablet (20 mg total) by mouth at bedtime.  90 tablet  3  . Cholecalciferol (VITAMIN D3) 1000 UNITS CAPS Take 1 capsule by mouth daily.      . diclofenac (VOLTAREN) 75 MG EC tablet Take 75 mg by mouth 2 (two) times daily.        . fenofibrate 160 MG tablet Take 1 tablet (160 mg total) by mouth daily.  90 tablet  3  . furosemide (LASIX) 20 MG tablet TAKE ONE TABLET BY MOUTH TWICE DAILY  60 tablet  2  . gabapentin (NEURONTIN) 300 MG capsule Take 300 mg by mouth 3 (three) times daily.      Marland Kitchen glipiZIDE (GLUCOTROL) 5 MG tablet 1 tab by mouth in the morning and 2 in the evening  90 tablet  2  . lisinopril (PRINIVIL,ZESTRIL) 40 MG tablet Take 1 tablet (40 mg total) by mouth daily.  90 tablet  1  . meclizine (ANTIVERT) 25 MG tablet Take 25 mg by mouth 3 (three) times daily as needed.      .  Mometasone Furo-Formoterol Fum (DULERA) 200-5 MCG/ACT AERO 2 puffs bid      . omeprazole (PRILOSEC) 20 MG capsule Take 1 capsule (20 mg total) by mouth daily.  90 capsule  3  . ondansetron (ZOFRAN) 4 MG tablet Take 4 mg by mouth every 8 (eight) hours as needed for nausea.      . pramipexole (MIRAPEX) 1 MG tablet Take 1 tablet (1 mg total) by mouth at bedtime.  30 tablet  5  . pramipexole (MIRAPEX) 1 MG tablet Take 1 tablet (1 mg total) by mouth at bedtime.  30 tablet  5  . predniSONE (DELTASONE) 10 MG tablet 3 po qd for 3 days then 2 po qd for 3 days the 1 po qd for 3 days  18 tablet  0  . sertraline (ZOLOFT) 50 MG tablet Take 1 tablet (50 mg total) by mouth daily.  90 tablet  1  . traMADol (ULTRAM) 50 MG tablet Take 50 mg by mouth 4 (four) times daily as needed.      . Triamcinolone Acetonide (NASACORT ALLERGY 24HR) 55 MCG/ACT AERO Place into the nose.  No current facility-administered medications for this visit.    No Known Allergies  Family History  Problem Relation Age of Onset  . Asthma    . Coronary artery disease    . Stroke    . Uterine cancer    . Leukemia    . Stomach cancer    . Stroke  80    History   Social History  . Marital Status: Widowed    Spouse Name: N/A    Number of Children: N/A  . Years of Education: N/A   Occupational History  . DELI-MANAGER @ [employer]    Social History Main Topics  . Smoking status: Never Smoker   . Smokeless tobacco: Never Used  . Alcohol Use: No  . Drug Use: No  . Sexual Activity: Not on file   Other Topics Concern  . Not on file   Social History Narrative   NO REG EXERCISE     Constitutional: Denies fever, malaise, fatigue, headache or abrupt weight changes.  Respiratory: Denies difficulty breathing, shortness of breath, cough or sputum production.   Cardiovascular: Pt reports leg swelling. Denies chest pain, chest tightness, palpitations or swelling in the hands.  Musculoskeletal: Denies decrease in range of  motion, difficulty with gait, muscle pain or joint pain and swelling.  Skin: Denies redness, rashes, lesions or ulcercations.    No other specific complaints in a complete review of systems (except as listed in HPI above).     Objective:   Physical Exam  BP 140/80  Pulse 105  Temp(Src) 97.5 F (36.4 C) (Oral)  Resp 16  Wt 274 lb (124.286 kg)  BMI 47.01 kg/m2  SpO2 96% Wt Readings from Last 3 Encounters:  11/22/12 274 lb (124.286 kg)  10/13/12 262 lb 12.8 oz (119.205 kg)  07/27/12 270 lb (122.471 kg)    General: Appears her stated age, obese but well developed, well nourished in NAD. Skin: Warm, dry and intact. No rashes, lesions or ulcerations noted. Weeping of serous fluid noted from BLE. Cardiovascular: Normal rate and rhythm. S1,S2 noted.  No murmur, rubs or gallops noted. No JVD or 2+ pitting edema of BLE. No carotid bruits noted. Pulmonary/Chest: Normal effort and positive vesicular breath sounds. No respiratory distress. No wheezes, rales or ronchi noted.  Musculoskeletal: Normal range of motion. No signs of joint swelling. No difficulty with gait.    BMET    Component Value Date/Time   NA 138 11/02/2012 1008   K 4.2 11/02/2012 1008   CL 103 11/02/2012 1008   CO2 24 11/02/2012 1008   GLUCOSE 106* 11/02/2012 1008   BUN 20 11/02/2012 1008   CREATININE 1.0 11/02/2012 1008   CREATININE 1.15* 07/27/2012 1705   CALCIUM 9.4 11/02/2012 1008   GFRNONAA 48.09 03/03/2009 0847   GFRAA 59 10/16/2007 0000    Lipid Panel     Component Value Date/Time   CHOL 159 11/02/2012 1008   TRIG 168.0* 11/02/2012 1008   HDL 44.70 11/02/2012 1008   CHOLHDL 4 11/02/2012 1008   VLDL 33.6 11/02/2012 1008   LDLCALC 81 11/02/2012 1008    CBC    Component Value Date/Time   WBC 7.9 11/02/2012 1008   RBC 3.85* 11/02/2012 1008   HGB 10.3* 11/02/2012 1008   HCT 31.4* 11/02/2012 1008   PLT 240.0 11/02/2012 1008   MCV 81.7 11/02/2012 1008   MCH 25.3* 07/27/2012 1705   MCHC 32.6 11/02/2012 1008   RDW 22.8*  11/02/2012 1008   LYMPHSABS 1.3 11/02/2012 1008  MONOABS 0.4 11/02/2012 1008   EOSABS 0.1 11/02/2012 1008   BASOSABS 0.0 11/02/2012 1008    Hgb A1C Lab Results  Component Value Date   HGBA1C 6.7* 11/02/2012         Assessment & Plan:   Peripheral edema:  Increase your lasix to 40 mg Daily for 5 days, then back down to 20 mg  If edema persist or returns, rtc for followup As for the weeping, this is not a huge concern, just try to keep the skin dry  RTC as needed or if symptoms persist or worsen

## 2012-11-22 NOTE — Patient Instructions (Signed)
Edema  Edema is an abnormal build-up of fluids in tissues. Because this is partly dependent on gravity (water flows to the lowest place), it is more common in the legs and thighs (lower extremities). It is also common in the looser tissues, like around the eyes. Painless swelling of the feet and ankles is common and increases as a person ages. It may affect both legs and may include the calves or even thighs. When squeezed, the fluid may move out of the affected area and may leave a dent for a few moments.  CAUSES    Prolonged standing or sitting in one place for extended periods of time. Movement helps pump tissue fluid into the veins, and absence of movement prevents this, resulting in edema.   Varicose veins. The valves in the veins do not work as well as they should. This causes fluid to leak into the tissues.   Fluid and salt overload.   Injury, burn, or surgery to the leg, ankle, or foot, may damage veins and allow fluid to leak out.   Sunburn damages vessels. Leaky vessels allow fluid to go out into the sunburned tissues.   Allergies (from insect bites or stings, medications or chemicals) cause swelling by allowing vessels to become leaky.   Protein in the blood helps keep fluid in your vessels. Low protein, as in malnutrition, allows fluid to leak out.   Hormonal changes, including pregnancy and menstruation, cause fluid retention. This fluid may leak out of vessels and cause edema.   Medications that cause fluid retention. Examples are sex hormones, blood pressure medications, steroid treatment, or anti-depressants.   Some illnesses cause edema, especially heart failure, kidney disease, or liver disease.   Surgery that cuts veins or lymph nodes, such as surgery done for the heart or for breast cancer, may result in edema.  DIAGNOSIS   Your caregiver is usually easily able to determine what is causing your swelling (edema) by simply asking what is wrong (getting a history) and examining you (doing  a physical). Sometimes x-rays, EKG (electrocardiogram or heart tracing), and blood work may be done to evaluate for underlying medical illness.  TREATMENT   General treatment includes:   Leg elevation (or elevation of the affected body part).   Restriction of fluid intake.   Prevention of fluid overload.   Compression of the affected body part. Compression with elastic bandages or support stockings squeezes the tissues, preventing fluid from entering and forcing it back into the blood vessels.   Diuretics (also called water pills or fluid pills) pull fluid out of your body in the form of increased urination. These are effective in reducing the swelling, but can have side effects and must be used only under your caregiver's supervision. Diuretics are appropriate only for some types of edema.  The specific treatment can be directed at any underlying causes discovered. Heart, liver, or kidney disease should be treated appropriately.  HOME CARE INSTRUCTIONS    Elevate the legs (or affected body part) above the level of the heart, while lying down.   Avoid sitting or standing still for prolonged periods of time.   Avoid putting anything directly under the knees when lying down, and do not wear constricting clothing or garters on the upper legs.   Exercising the legs causes the fluid to work back into the veins and lymphatic channels. This may help the swelling go down.   The pressure applied by elastic bandages or support stockings can help reduce ankle swelling.     A low-salt diet may help reduce fluid retention and decrease the ankle swelling.   Take any medications exactly as prescribed.  SEEK MEDICAL CARE IF:   Your edema is not responding to recommended treatments.  SEEK IMMEDIATE MEDICAL CARE IF:    You develop shortness of breath or chest pain.   You cannot breathe when you lay down; or if, while lying down, you have to get up and go to the window to get your breath.   You are having increasing  swelling without relief from treatment.   You develop a fever over 102 F (38.9 C).   You develop pain or redness in the areas that are swollen.   Tell your caregiver right away if you have gained 3 lb/1.4 kg in 1 day or 5 lb/2.3 kg in a week.  MAKE SURE YOU:    Understand these instructions.   Will watch your condition.   Will get help right away if you are not doing well or get worse.  Document Released: 03/22/2005 Document Revised: 09/21/2011 Document Reviewed: 11/08/2007  Exit[employer] Patient Information 2014 [employer].

## 2012-11-29 DIAGNOSIS — M1710 Unilateral primary osteoarthritis, unspecified knee: Secondary | ICD-10-CM | POA: Diagnosis not present

## 2012-11-29 DIAGNOSIS — IMO0002 Reserved for concepts with insufficient information to code with codable children: Secondary | ICD-10-CM | POA: Diagnosis not present

## 2012-11-30 DIAGNOSIS — M545 Low back pain, unspecified: Secondary | ICD-10-CM | POA: Diagnosis not present

## 2012-11-30 DIAGNOSIS — H908 Mixed conductive and sensorineural hearing loss, unspecified: Secondary | ICD-10-CM | POA: Diagnosis not present

## 2012-11-30 DIAGNOSIS — M25569 Pain in unspecified knee: Secondary | ICD-10-CM | POA: Diagnosis not present

## 2012-12-12 DIAGNOSIS — J310 Chronic rhinitis: Secondary | ICD-10-CM | POA: Diagnosis not present

## 2012-12-19 DIAGNOSIS — J310 Chronic rhinitis: Secondary | ICD-10-CM | POA: Diagnosis not present

## 2012-12-22 DIAGNOSIS — IMO0001 Reserved for inherently not codable concepts without codable children: Secondary | ICD-10-CM | POA: Diagnosis not present

## 2012-12-22 DIAGNOSIS — H2510 Age-related nuclear cataract, unspecified eye: Secondary | ICD-10-CM | POA: Diagnosis not present

## 2012-12-22 DIAGNOSIS — H5210 Myopia, unspecified eye: Secondary | ICD-10-CM | POA: Diagnosis not present

## 2012-12-26 DIAGNOSIS — J310 Chronic rhinitis: Secondary | ICD-10-CM | POA: Diagnosis not present

## 2013-01-02 DIAGNOSIS — J310 Chronic rhinitis: Secondary | ICD-10-CM | POA: Diagnosis not present

## 2013-01-08 DIAGNOSIS — J310 Chronic rhinitis: Secondary | ICD-10-CM | POA: Diagnosis not present

## 2013-01-08 DIAGNOSIS — G4710 Hypersomnia, unspecified: Secondary | ICD-10-CM | POA: Diagnosis not present

## 2013-01-08 DIAGNOSIS — R911 Solitary pulmonary nodule: Secondary | ICD-10-CM | POA: Diagnosis not present

## 2013-01-08 DIAGNOSIS — R0609 Other forms of dyspnea: Secondary | ICD-10-CM | POA: Diagnosis not present

## 2013-01-09 DIAGNOSIS — J310 Chronic rhinitis: Secondary | ICD-10-CM | POA: Diagnosis not present

## 2013-01-10 DIAGNOSIS — IMO0002 Reserved for concepts with insufficient information to code with codable children: Secondary | ICD-10-CM | POA: Diagnosis not present

## 2013-01-10 DIAGNOSIS — M1710 Unilateral primary osteoarthritis, unspecified knee: Secondary | ICD-10-CM | POA: Diagnosis not present

## 2013-01-11 DIAGNOSIS — R911 Solitary pulmonary nodule: Secondary | ICD-10-CM | POA: Diagnosis not present

## 2013-01-11 DIAGNOSIS — G4710 Hypersomnia, unspecified: Secondary | ICD-10-CM | POA: Diagnosis not present

## 2013-01-11 DIAGNOSIS — R0609 Other forms of dyspnea: Secondary | ICD-10-CM | POA: Diagnosis not present

## 2013-01-16 DIAGNOSIS — J310 Chronic rhinitis: Secondary | ICD-10-CM | POA: Diagnosis not present

## 2013-01-17 ENCOUNTER — Encounter: Payer: Self-pay | Admitting: Family Medicine

## 2013-01-17 ENCOUNTER — Ambulatory Visit (INDEPENDENT_AMBULATORY_CARE_PROVIDER_SITE_OTHER): Payer: Medicare Other | Admitting: Family Medicine

## 2013-01-17 VITALS — BP 126/73 | HR 98 | Temp 98.2°F | Wt 272.2 lb

## 2013-01-17 DIAGNOSIS — I10 Essential (primary) hypertension: Secondary | ICD-10-CM | POA: Diagnosis not present

## 2013-01-17 DIAGNOSIS — Z23 Encounter for immunization: Secondary | ICD-10-CM | POA: Diagnosis not present

## 2013-01-17 DIAGNOSIS — IMO0001 Reserved for inherently not codable concepts without codable children: Secondary | ICD-10-CM

## 2013-01-17 DIAGNOSIS — E1165 Type 2 diabetes mellitus with hyperglycemia: Secondary | ICD-10-CM

## 2013-01-17 DIAGNOSIS — E785 Hyperlipidemia, unspecified: Secondary | ICD-10-CM

## 2013-01-17 DIAGNOSIS — E1159 Type 2 diabetes mellitus with other circulatory complications: Secondary | ICD-10-CM | POA: Diagnosis not present

## 2013-01-17 LAB — BASIC METABOLIC PANEL
BUN: 34 mg/dL — ABNORMAL HIGH (ref 6–23)
CO2: 24 mEq/L (ref 19–32)
Calcium: 9.2 mg/dL (ref 8.4–10.5)
Chloride: 104 mEq/L (ref 96–112)
Creatinine, Ser: 1.3 mg/dL — ABNORMAL HIGH (ref 0.4–1.2)
GFR: 42.57 mL/min — ABNORMAL LOW (ref >60.00)
Glucose, Bld: 93 mg/dL (ref 70–99)
Potassium: 4.4 mEq/L (ref 3.5–5.1)
Sodium: 139 mEq/L (ref 135–145)

## 2013-01-17 LAB — POCT URINALYSIS DIPSTICK
Bilirubin, UA: NEGATIVE
Blood, UA: NEGATIVE
Glucose, UA: NEGATIVE
Ketones, UA: NEGATIVE
Leukocytes, UA: NEGATIVE
Nitrite, UA: NEGATIVE
Protein, UA: NEGATIVE
Spec Grav, UA: 1.01
Urobilinogen, UA: 0.2
pH, UA: 6

## 2013-01-17 LAB — MICROALBUMIN / CREATININE URINE RATIO
Creatinine,U: 77.5 mg/dL
Microalb Creat Ratio: 0.4 mg/g (ref 0.0–30.0)
Microalb, Ur: 0.3 mg/dL (ref 0.0–1.9)

## 2013-01-17 LAB — LIPID PANEL
Cholesterol: 160 mg/dL (ref 0–200)
HDL: 37.7 mg/dL — ABNORMAL LOW (ref >39.00)
Total CHOL/HDL Ratio: 4
Triglycerides: 317 mg/dL — ABNORMAL HIGH (ref 0.0–149.0)
VLDL: 63.4 mg/dL — ABNORMAL HIGH (ref 0.0–40.0)

## 2013-01-17 LAB — LDL CHOLESTEROL, DIRECT: Direct LDL: 79.2 mg/dL

## 2013-01-17 LAB — HEMOGLOBIN A1C: Hgb A1c MFr Bld: 7.2 % — ABNORMAL HIGH (ref 4.6–6.5)

## 2013-01-17 LAB — HEPATIC FUNCTION PANEL
ALT: 25 U/L (ref 0–35)
AST: 23 U/L (ref 0–37)
Albumin: 3.8 g/dL (ref 3.5–5.2)
Alkaline Phosphatase: 71 U/L (ref 39–117)
Bilirubin, Direct: 0 mg/dL (ref 0.0–0.3)
Total Bilirubin: 0.2 mg/dL — ABNORMAL LOW (ref 0.3–1.2)
Total Protein: 7.3 g/dL (ref 6.0–8.3)

## 2013-01-17 NOTE — Progress Notes (Signed)
  Subjective:    Patient ID: Erin [name], female    DOB: Jun 07, 1945, 66 y.o.   MRN: [id number]  HPI HYPERTENSION Disease Monitoring Blood pressure range-not checking  Chest pain- no      Dyspnea- no Medications Compliance- good Lightheadedness- no   Edema- yes   DIABETES Disease Monitoring Blood Sugar ranges- 100-250 Polyuria- no New Visual problems- + cataracts Medications Compliance- good Hypoglycemic symptoms- no   HYPERLIPIDEMIA Disease Monitoring See symptoms for Hypertension Medications Compliance- good RUQ pain- no  Muscle aches- no  ROS See HPI above   PMH Smoking Status noted     Review of Systems As above    Objective:   Physical Exam  BP 126/73  Pulse 98  Temp(Src) 98.2 F (36.8 C) (Oral)  Wt 272 lb 3.2 oz (123.469 kg)  BMI 46.7 kg/m2  SpO2 94% General appearance: alert, cooperative, appears stated age and morbidly obese Throat: lips, mucosa, and tongue normal; teeth and gums normal Neck: no adenopathy, no carotid bruit, no JVD, supple, symmetrical, trachea midline and thyroid not enlarged, symmetric, no tenderness/mass/nodules Lungs: clear to auscultation bilaterally Heart: regular rate and rhythm, S1, S2 normal, no murmur, click, rub or gallop Extremities: extremities normal, atraumatic, no cyanosis or edema Sensory exam of the foot is normal, tested with the monofilament. Good pulses, no lesions or ulcers, good peripheral pulses.      Assessment & Plan:

## 2013-01-17 NOTE — Assessment & Plan Note (Signed)
Check labs stable 

## 2013-01-17 NOTE — Assessment & Plan Note (Signed)
Check labs con't meds 

## 2013-01-23 ENCOUNTER — Telehealth: Payer: Self-pay

## 2013-01-23 DIAGNOSIS — E119 Type 2 diabetes mellitus without complications: Secondary | ICD-10-CM

## 2013-01-23 DIAGNOSIS — J310 Chronic rhinitis: Secondary | ICD-10-CM | POA: Diagnosis not present

## 2013-01-23 DIAGNOSIS — E860 Dehydration: Secondary | ICD-10-CM

## 2013-01-23 MED ORDER — ATORVASTATIN CALCIUM 40 MG PO TABS: 40.0000 mg | ORAL_TABLET | Freq: Every day | ORAL | Status: DC

## 2013-01-23 NOTE — Telephone Encounter (Signed)
Patient has been made aware and voiced understanding. Med's sent and referral put in.   Copy mailed  KP

## 2013-01-23 NOTE — Telephone Encounter (Signed)
Message copied by Arnette Norris on Tue Jan 23, 2013  5:14 PM ------      Message from: Lelon Perla      Created: Sun Jan 21, 2013 11:31 AM       Bun/cr elevated----  Dehydrated.   Increase po fluids---  Recheck bmp 2 weeks      Dm not controlled--- refer to endo      Cholesterol--- LDL goal < 70,  HDL >40,  TG < 150.  Diet and exercise will increase HDL and decrease LDL and TG.  Fish,  Fish Oil, Flaxseed oil will also help increase the HDL and decrease Triglycerides.   Recheck labs in 3 months----  Increase lipitor to 40 mg and con't fenofibrate.       Lipid hep, bmp, hgba1c  250.01  272.4  .                   ------

## 2013-01-30 DIAGNOSIS — J310 Chronic rhinitis: Secondary | ICD-10-CM | POA: Diagnosis not present

## 2013-02-05 ENCOUNTER — Other Ambulatory Visit (INDEPENDENT_AMBULATORY_CARE_PROVIDER_SITE_OTHER): Payer: Medicare Other

## 2013-02-05 DIAGNOSIS — E860 Dehydration: Secondary | ICD-10-CM

## 2013-02-05 LAB — BASIC METABOLIC PANEL
BUN: 21 mg/dL (ref 6–23)
CO2: 26 mEq/L (ref 19–32)
Calcium: 9 mg/dL (ref 8.4–10.5)
Chloride: 106 mEq/L (ref 96–112)
Creatinine, Ser: 1.2 mg/dL (ref 0.4–1.2)
GFR: 47.97 mL/min — ABNORMAL LOW (ref >60.00)
Glucose, Bld: 87 mg/dL (ref 70–99)
Potassium: 4.2 mEq/L (ref 3.5–5.1)
Sodium: 137 mEq/L (ref 135–145)

## 2013-02-06 DIAGNOSIS — J310 Chronic rhinitis: Secondary | ICD-10-CM | POA: Diagnosis not present

## 2013-02-09 ENCOUNTER — Ambulatory Visit (INDEPENDENT_AMBULATORY_CARE_PROVIDER_SITE_OTHER): Payer: Medicare Other | Admitting: Internal Medicine

## 2013-02-09 ENCOUNTER — Encounter: Payer: Self-pay | Admitting: Internal Medicine

## 2013-02-09 VITALS — BP 138/90 | HR 104 | Temp 97.8°F | Resp 12 | Ht 64.0 in | Wt 268.9 lb

## 2013-02-09 DIAGNOSIS — E1165 Type 2 diabetes mellitus with hyperglycemia: Secondary | ICD-10-CM

## 2013-02-09 DIAGNOSIS — IMO0001 Reserved for inherently not codable concepts without codable children: Secondary | ICD-10-CM

## 2013-02-09 MED ORDER — METFORMIN HCL 500 MG PO TABS: 500.0000 mg | ORAL_TABLET | Freq: Two times a day (BID) | ORAL | Status: DC

## 2013-02-09 NOTE — Progress Notes (Signed)
Patient ID: Erin [name], female   DOB: February 02, 1946, 67 y.o.   MRN: [id number]  HPI: Erin [name] is a 67 y.o.-year-old female, referred by her PCP, Dr. Laury Axon, for management of DM2, non-insulin-dependent, uncontrolled, without complications.  Patient has been diagnosed with diabetes in 2013; she has not been on insulin before. Last hemoglobin A1c was: Lab Results  Component Value Date   HGBA1C 7.2* 01/17/2013   HGBA1C 6.7* 11/02/2012   HGBA1C 6.6* 07/18/2012   Pt is on a regimen of: - Glipizide 5 mg tid (changed from bid 2 mo ago)  Pt checks her sugars 1-4 a day and they are: - am: 110-160 - 2h after b'fast: checking 30 min-1h  - before lunch: 124-200 - 2h after lunch: sometimes checking - before dinner: 120-200 - 2h after dinner: up to 270  No lows. Lowest sugar was 90; she has hypoglycemia awareness at 70.  Highest sugar was 300s..  Pt's meals are: - Breakfast: eggs + toast - Lunch: soup or sandwich - Dinner: meat + veggies - Snacks: jello, pudding She c/o low appetite and bloating and sometimes nausea if eats to much.   - no CKD, last BUN/creatinine:  Lab Results  Component Value Date   BUN 21 02/05/2013   CREATININE 1.2 02/05/2013  On Lisinopril. - last set of lipids: Lab Results  Component Value Date   CHOL 160 01/17/2013   HDL 37.70* 01/17/2013   LDLCALC 81 11/02/2012   LDLDIRECT 79.2 01/17/2013   TRIG 317.0* 01/17/2013   CHOLHDL 4 01/17/2013  On Lipitor. - last eye exam was in 12/2012. No DR. Cataract. - + numbness and tingling in her  big toe, not feet.   I reviewed her chart and she also has a history of HL, asthma, depression, vit D def.  Pt has FH of DM in father and aunt.  ROS: Constitutional: + weight gain, no fatigue, no subjective hyperthermia/hypothermia, + poor sleep Eyes: + blurry vision, no xerophthalmia ENT: no sore throat, no nodules palpated in throat, no dysphagia/odynophagia, no hoarseness Cardiovascular: no CP/+ SOB/no  palpitations/+ leg swelling Respiratory: no cough/+ SOB Gastrointestinal: + N/no V/D/C Musculoskeletal: + muscle/+ joint aches Skin: no rashes Neurological: no tremors/numbness/tingling/dizziness Psychiatric: + depression/no anxiety  Past Medical History  Diagnosis Date  . Depression   . Hyperlipidemia    Past Surgical History  Procedure Laterality Date  . Cholecystectomy  2002  . Tubal ligation    . Tonsillectomy     History   Social History  . Marital Status: Widowed    Spouse Name: N/A    Number of Children: 4   Occupational History  . Retired, former [employer] @ [employer]    Social History Main Topics  . Smoking status: Former Games developer  . Smokeless tobacco: Never Used  . Alcohol Use: No  . Drug Use: No   Social History Narrative   NO REG EXERCISE   Caffeine use: daily   Current Outpatient Prescriptions on File Prior to Visit  Medication Sig Dispense Refill  . albuterol (PROVENTIL) (2.5 MG/3ML) 0.083% nebulizer solution Take 2.5 mg by nebulization every 6 (six) hours as needed for wheezing.      Marland Kitchen ALPRAZolam (XANAX) 0.25 MG tablet As directed before procedure  20 tablet  0  . atorvastatin (LIPITOR) 40 MG tablet Take 1 tablet (40 mg total) by mouth daily.  30 tablet  2  . CELEBREX 200 MG capsule Take 1 capsule by mouth daily as needed.      Marland Kitchen  Cholecalciferol (VITAMIN D3) 1000 UNITS CAPS Take 1 capsule by mouth daily.      . diclofenac (VOLTAREN) 75 MG EC tablet Take 75 mg by mouth 2 (two) times daily.        Marland Kitchen EPIPEN 2-PAK 0.3 MG/0.3ML SOAJ injection Inject 0.3 mLs into the skin as directed.      . furosemide (LASIX) 20 MG tablet TAKE ONE TABLET BY MOUTH TWICE DAILY  60 tablet  2  . gabapentin (NEURONTIN) 300 MG capsule Take 300 mg by mouth 3 (three) times daily.      Marland Kitchen glipiZIDE (GLUCOTROL) 5 MG tablet 1 tab by mouth in the morning and 2 in the evening  90 tablet  2  . HYDROcodone-acetaminophen (NORCO) 10-325 MG per tablet Take 1 tablet by mouth every 8 (eight)  hours as needed.      Marland Kitchen lisinopril (PRINIVIL,ZESTRIL) 40 MG tablet Take 1 tablet (40 mg total) by mouth daily.  90 tablet  1  . Mometasone Furo-Formoterol Fum (DULERA) 200-5 MCG/ACT AERO 2 puffs bid      . omeprazole (PRILOSEC) 20 MG capsule Take 1 capsule (20 mg total) by mouth daily.  90 capsule  3  . pramipexole (MIRAPEX) 1 MG tablet Take 1 tablet by mouth daily.      . sertraline (ZOLOFT) 50 MG tablet Take 1 tablet (50 mg total) by mouth daily.  90 tablet  1  . Triamcinolone Acetonide (NASACORT ALLERGY 24HR) 55 MCG/ACT AERO Place into the nose.      . VOLTAREN 1 % GEL       . fenofibrate 160 MG tablet Take 1 tablet (160 mg total) by mouth daily.  90 tablet  3   No current facility-administered medications on file prior to visit.   No Known Allergies Family History  Problem Relation Age of Onset  . Asthma    . Coronary artery disease    . Stroke    . Uterine cancer    . Leukemia    . Stomach cancer    . Stroke  80    PE: BP 138/90  Pulse 104  Temp(Src) 97.8 F (36.6 C) (Oral)  Resp 12  Ht 5\' 4"  (1.626 m)  Wt 268 lb 14.4 oz (121.972 kg)  BMI 46.13 kg/m2  SpO2 95% Wt Readings from Last 3 Encounters:  02/09/13 268 lb 14.4 oz (121.972 kg)  01/17/13 272 lb 3.2 oz (123.469 kg)  11/22/12 274 lb (124.286 kg)   Constitutional: obese, in NAD Eyes: PERRLA, EOMI, no exophthalmos ENT: moist mucous membranes, no thyromegaly, no cervical lymphadenopathy Cardiovascular: RRR, No MRG Respiratory: CTA B Gastrointestinal: abdomen soft, NT, ND, BS+ Musculoskeletal: no deformities, strength intact in all 4 Skin: moist, warm, no rashes Neurological: + tremor with outstretched hands, DTR normal in all 4  ASSESSMENT: 1. DM2, non-insulin-dependent, uncontrolled, without complications  PLAN:  1. Patient with long-standing, recently more uncontrolled diabetes, on oral antidiabetic regimen, which became insufficient - We discussed about options for treatment, and I suggested to:   Move  Glipizide from lunchtime to am >> 10 mg in am and 5 with dinner  Start Metformin 500 mg  With dinner and advance to bid - Strongly advised her to start checking sugars at different times of the day - check 2 times a day, rotating checks - given sugar log and advised how to fill it and to bring it at next appt  - given foot care handout and explained the principles  - given instructions for  hypoglycemia management "15-15 rule"  - advised for yearly eye exams - she is up to date - had the flu vaccine this season - Return to clinic in 1 mo with sugar log

## 2013-02-09 NOTE — Patient Instructions (Addendum)
Please return in 1 month with your sugar log.  Move Glipizide from lunchtime to am: take 10 mg in am and 5 mg with dinner. Please start Metformin 500 mg with dinner x 4 days. If you tolerate this well, add another Metformin tablet (500 mg) with breakfast.Continue with 500 mg of metformin twice a day with breakfast and dinner.  Please call me if you cannot tolerate Metformin: nausea or diarrhea. Please call me in 2 weeks to let me know how your sugars are doing.   PATIENT INSTRUCTIONS FOR TYPE 2 DIABETES:  DIET AND EXERCISE Diet and exercise is an important part of diabetic treatment.  We recommended aerobic exercise in the form of brisk walking (working between 40-60% of maximal aerobic capacity, similar to brisk walking) for 150 minutes per week (such as 30 minutes five days per week) along with 3 times per week performing 'resistance' training (using various gauge rubber tubes with handles) 5-10 exercises involving the major muscle groups (upper body, lower body and core) performing 10-15 repetitions (or near fatigue) each exercise. Start at half the above goal but build slowly to reach the above goals. If limited by weight, joint pain, or disability, we recommend daily walking in a swimming pool with water up to waist to reduce pressure from joints while allow for adequate exercise.    BLOOD GLUCOSES Monitoring your blood glucoses is important for continued management of your diabetes. Please check your blood glucoses 2-4 times a day: fasting, before meals and at bedtime (you can rotate these measurements - e.g. one day check before the 3 meals, the next day check before 2 of the meals and before bedtime, etc.   HYPOGLYCEMIA (low blood sugar) Hypoglycemia is usually a reaction to not eating, exercising, or taking too much insulin/ other diabetes drugs.  Symptoms include tremors, sweating, hunger, confusion, headache, etc. Treat IMMEDIATELY with 15 grams of Carbs:   4 glucose tablets    cup  regular juice/soda   2 tablespoons raisins   4 teaspoons sugar   1 tablespoon honey Recheck blood glucose in 15 mins and repeat above if still symptomatic/blood glucose <100. Please contact our office at [phone] if you have questions about how to next handle your insulin.  RECOMMENDATIONS TO REDUCE YOUR RISK OF DIABETIC COMPLICATIONS: * Take your prescribed MEDICATION(S). * Follow a DIABETIC diet: Complex carbs, fiber rich foods, heart healthy fish twice weekly, (monounsaturated and polyunsaturated) fats * AVOID saturated/trans fats, high fat foods, >2,300 mg salt per day. * EXERCISE at least 5 times a week for 30 minutes or preferably daily.  * DO NOT SMOKE OR DRINK more than 1 drink a day. * Check your FEET every day. Do not wear tightfitting shoes. Contact us if you develop an ulcer * See your EYE doctor once a year or more if needed * Get a FLU shot once a year * Get a PNEUMONIA vaccine once before and once after age 97 years  GOALS:  * Your Hemoglobin A1c of <7%  * fasting sugars need to be <130 * after meals sugars need to be <180 (2h after you start eating) * Your Systolic BP should be 140 or lower  * Your Diastolic BP should be 80 or lower  * Your HDL (Good Cholesterol) should be 40 or higher  * Your LDL (Bad Cholesterol) should be 100 or lower  * Your Triglycerides should be 150 or lower  * Your Urine microalbumin (kidney function) should be <30 * Your Body Mass  Index should be 25 or lower   We will be glad to help you achieve these goals. Our telephone number is: [phone].

## 2013-02-12 DIAGNOSIS — L02219 Cutaneous abscess of trunk, unspecified: Secondary | ICD-10-CM | POA: Diagnosis not present

## 2013-02-13 DIAGNOSIS — G4710 Hypersomnia, unspecified: Secondary | ICD-10-CM | POA: Diagnosis not present

## 2013-02-13 DIAGNOSIS — R911 Solitary pulmonary nodule: Secondary | ICD-10-CM | POA: Diagnosis not present

## 2013-02-13 DIAGNOSIS — J310 Chronic rhinitis: Secondary | ICD-10-CM | POA: Diagnosis not present

## 2013-02-13 DIAGNOSIS — R0609 Other forms of dyspnea: Secondary | ICD-10-CM | POA: Diagnosis not present

## 2013-02-14 ENCOUNTER — Ambulatory Visit: Payer: Medicare Other | Admitting: Nurse Practitioner

## 2013-02-21 DIAGNOSIS — J310 Chronic rhinitis: Secondary | ICD-10-CM | POA: Diagnosis not present

## 2013-02-27 DIAGNOSIS — J310 Chronic rhinitis: Secondary | ICD-10-CM | POA: Diagnosis not present

## 2013-03-07 ENCOUNTER — Other Ambulatory Visit: Payer: Self-pay | Admitting: Family Medicine

## 2013-03-12 ENCOUNTER — Ambulatory Visit (INDEPENDENT_AMBULATORY_CARE_PROVIDER_SITE_OTHER): Payer: Medicare Other | Admitting: Internal Medicine

## 2013-03-12 ENCOUNTER — Encounter: Payer: Self-pay | Admitting: Internal Medicine

## 2013-03-12 VITALS — BP 180/70 | HR 109 | Temp 98.1°F | Resp 14 | Ht 64.0 in | Wt 265.5 lb

## 2013-03-12 DIAGNOSIS — IMO0001 Reserved for inherently not codable concepts without codable children: Secondary | ICD-10-CM | POA: Diagnosis not present

## 2013-03-12 DIAGNOSIS — E1165 Type 2 diabetes mellitus with hyperglycemia: Secondary | ICD-10-CM

## 2013-03-12 MED ORDER — GLIPIZIDE 5 MG PO TABS: ORAL_TABLET | ORAL | Status: DC

## 2013-03-12 NOTE — Progress Notes (Signed)
Patient ID: Erin [name], female   DOB: 17-Jan-1946, 67 y.o.   MRN: [id number]  HPI: Erin [name] is a 67 y.o.-year-old female, returning for f/u for DM2, dx 2013, non-insulin-dependent, uncontrolled, without complications. Last visit 1 mo ago.  Pt's mother (83 y/o) was sick and hospitalized and she recently died >> she could not take care of her diabetes as she should have.   Pt also had a boil that had to be lanced >> also on ABx >> resolved.   She still has cough/congestion from a lingering URI.  Last hemoglobin A1c was: Lab Results  Component Value Date   HGBA1C 7.2* 01/17/2013   HGBA1C 6.7* 11/02/2012   HGBA1C 6.6* 07/18/2012   Pt is on a regimen of: - Glipizide 10 mg in am and 5 in pm - Metformin 500 mg bid - added in 02/2013 >> tolerates the Metformin (had diarrhea and nausea when she started)  Pt checks her sugars 2 a day and they are MUCH IMPROVED (does not bring log): - am: 110-160 >> 110-115 (highest 150) - before lunch: 124-200 >> 100-115 - 2h after lunch: sometimes checking >> n/c - before dinner: 120-200 >> 130s - 2h after dinner: up to 270 >> 130-140 No lows. Lowest sugar was 90; she has hypoglycemia awareness at 70.  Highest sugar was 150.  Pt's meals are: - Breakfast: eggs + toast - Lunch: soup or sandwich - Dinner: meat + veggies - Snacks: jello, pudding  - no CKD, last BUN/creatinine:  Lab Results  Component Value Date   BUN 21 02/05/2013   CREATININE 1.2 02/05/2013  On Lisinopril. - last set of lipids: Lab Results  Component Value Date   CHOL 160 01/17/2013   HDL 37.70* 01/17/2013   LDLCALC 81 11/02/2012   LDLDIRECT 79.2 01/17/2013   TRIG 317.0* 01/17/2013   CHOLHDL 4 01/17/2013  On Lipitor. - last eye exam was in 12/2012. No DR. Cataract. - + numbness and tingling in her  big toe, not feet.   She also has a history of HL, asthma, depression, vit D def.  I reviewed pt's medications, allergies, PMH, social hx, family hx and no changes  required, except as mentioned above.  ROS: Constitutional: no weight gain, no fatigue, no subjective hyperthermia/hypothermia, + poor sleep Eyes: no blurry vision, no xerophthalmia ENT: no sore throat, no nodules palpated in throat, no dysphagia/odynophagia, no hoarseness Cardiovascular: no CP/+ SOB/no palpitations/+ leg swelling Respiratory: + cough/+ SOB - congestion Gastrointestinal: + N  (initially, with Metformin)/no V/+ D (initially, with Metformin) /no C  Musculoskeletal: + muscle/+ joint aches Skin: no rashes Neurological: no tremors/numbness/tingling/dizziness  PE: BP 180/70  Pulse 109  Temp(Src) 98.1 F (36.7 C)  Resp 14  Ht 5\' 4"  (1.626 m)  Wt 265 lb 8 oz (120.43 kg)  BMI 45.55 kg/m2  SpO2 94% Wt Readings from Last 3 Encounters:  03/12/13 265 lb 8 oz (120.43 kg)  02/09/13 268 lb 14.4 oz (121.972 kg)  01/17/13 272 lb 3.2 oz (123.469 kg)   Constitutional: obese, in NAD Eyes: PERRLA, EOMI, no exophthalmos ENT: moist mucous membranes, no thyromegaly, no cervical lymphadenopathy Cardiovascular: RRR, No MRG Respiratory: CTA B Gastrointestinal: abdomen soft, NT, ND, BS+ Musculoskeletal: no deformities, strength intact in all 4 Skin: moist, warm, no rashes  ASSESSMENT: 1. DM2, non-insulin-dependent, uncontrolled, without complications  PLAN:  1. Patient with long-standing, recently more uncontrolled diabetes but improving after addition of Metformin at last visit. - I suggested to:   Continue Glipizide  10 mg in am and 5 with dinner  Continue  Metformin 500 mg 2x a day - continue checking sugars at different times of the day - check 2 times a day, rotating checks - given more logs - she is up to date with yearly eye exams - had the flu vaccine this season - refilled glipizide - Return to clinic in 3 mo with sugar log

## 2013-03-12 NOTE — Patient Instructions (Signed)
   Continue Glipizide 10 mg in am and 5 with dinner  Continue  Metformin 500 mg 2x a day  Please return in 3 months with your sugar log.

## 2013-03-13 DIAGNOSIS — J310 Chronic rhinitis: Secondary | ICD-10-CM | POA: Diagnosis not present

## 2013-03-26 DIAGNOSIS — R059 Cough, unspecified: Secondary | ICD-10-CM | POA: Diagnosis not present

## 2013-03-26 DIAGNOSIS — R05 Cough: Secondary | ICD-10-CM | POA: Diagnosis not present

## 2013-03-26 DIAGNOSIS — J310 Chronic rhinitis: Secondary | ICD-10-CM | POA: Diagnosis not present

## 2013-04-03 DIAGNOSIS — J310 Chronic rhinitis: Secondary | ICD-10-CM | POA: Diagnosis not present

## 2013-04-10 DIAGNOSIS — J310 Chronic rhinitis: Secondary | ICD-10-CM | POA: Diagnosis not present

## 2013-04-17 DIAGNOSIS — J310 Chronic rhinitis: Secondary | ICD-10-CM | POA: Diagnosis not present

## 2013-04-23 ENCOUNTER — Other Ambulatory Visit: Payer: Self-pay | Admitting: Family Medicine

## 2013-04-26 DIAGNOSIS — R093 Abnormal sputum: Secondary | ICD-10-CM | POA: Diagnosis not present

## 2013-04-26 DIAGNOSIS — R071 Chest pain on breathing: Secondary | ICD-10-CM | POA: Diagnosis not present

## 2013-04-26 DIAGNOSIS — R059 Cough, unspecified: Secondary | ICD-10-CM | POA: Diagnosis not present

## 2013-04-26 DIAGNOSIS — R062 Wheezing: Secondary | ICD-10-CM | POA: Diagnosis not present

## 2013-04-26 DIAGNOSIS — J189 Pneumonia, unspecified organism: Secondary | ICD-10-CM | POA: Diagnosis not present

## 2013-04-26 DIAGNOSIS — E669 Obesity, unspecified: Secondary | ICD-10-CM | POA: Diagnosis not present

## 2013-04-26 DIAGNOSIS — R509 Fever, unspecified: Secondary | ICD-10-CM | POA: Diagnosis not present

## 2013-04-26 DIAGNOSIS — J159 Unspecified bacterial pneumonia: Secondary | ICD-10-CM | POA: Diagnosis not present

## 2013-04-26 DIAGNOSIS — R0602 Shortness of breath: Secondary | ICD-10-CM | POA: Diagnosis not present

## 2013-04-26 DIAGNOSIS — R05 Cough: Secondary | ICD-10-CM | POA: Diagnosis not present

## 2013-04-26 DIAGNOSIS — R079 Chest pain, unspecified: Secondary | ICD-10-CM | POA: Diagnosis not present

## 2013-05-01 ENCOUNTER — Encounter: Payer: Self-pay | Admitting: Family Medicine

## 2013-05-01 ENCOUNTER — Ambulatory Visit (INDEPENDENT_AMBULATORY_CARE_PROVIDER_SITE_OTHER): Payer: Medicare Other | Admitting: Family Medicine

## 2013-05-01 VITALS — BP 160/72 | HR 108 | Temp 98.3°F | Wt 262.0 lb

## 2013-05-01 DIAGNOSIS — R062 Wheezing: Secondary | ICD-10-CM | POA: Diagnosis not present

## 2013-05-01 DIAGNOSIS — J189 Pneumonia, unspecified organism: Secondary | ICD-10-CM

## 2013-05-01 MED ORDER — PREDNISONE 10 MG PO TABS: ORAL_TABLET | ORAL | Status: DC

## 2013-05-01 MED ORDER — INSULIN PEN NEEDLE 32G X 4 MM MISC: Status: DC

## 2013-05-01 MED ORDER — METHYLPREDNISOLONE ACETATE 80 MG/ML IJ SUSP
80.0000 mg | Freq: Once | INTRAMUSCULAR | Status: AC
  Administered 2013-05-01: 80 mg via INTRAMUSCULAR

## 2013-05-01 MED ORDER — CEFTRIAXONE SODIUM 1 G IJ SOLR
1.0000 g | Freq: Once | INTRAMUSCULAR | Status: AC
  Administered 2013-05-01: 1 g via INTRAMUSCULAR

## 2013-05-01 MED ORDER — ALBUTEROL SULFATE (2.5 MG/3ML) 0.083% IN NEBU
2.5000 mg | INHALATION_SOLUTION | Freq: Once | RESPIRATORY_TRACT | Status: AC
  Administered 2013-05-01: 2.5 mg via RESPIRATORY_TRACT

## 2013-05-01 MED ORDER — LEVOFLOXACIN 500 MG PO TABS: 500.0000 mg | ORAL_TABLET | Freq: Every day | ORAL | Status: DC

## 2013-05-01 NOTE — Patient Instructions (Signed)
Regular insulin sliding scale  200-250  2 u 251-300  4 u 301-350  6u  351-400  8 u > 400 10 u and call Dr

## 2013-05-01 NOTE — Progress Notes (Signed)
  Subjective:     Erin [name] is a 68 y.o. female here for evaluation of a cough. Onset of symptoms was 1 week ago. Symptoms have been gradually improving since that time.  Pt was seen at [facility] and dx with flu and pneumonia.  Pt finished z pak and pred taper.  The cough is barky and productive and is aggravated by cold air, infection and reclining position. Associated symptoms include: chills, fever, shortness of breath, sputum production and wheezing. Patient does not have a history of asthma. --+ copd. Patient does have a history of environmental allergens. Patient has not traveled recently. Patient does not have a history of smoking. Patient has not had a previous chest x-ray. Patient has not had a PPD done.  The following portions of the patient's history were reviewed and updated as appropriate: allergies, current medications, past family history, past medical history, past social history, past surgical history and problem list.  Review of Systems Pertinent items are noted in HPI.    Objective:    Oxygen saturation 96% on room air BP 160/72  Pulse 108  Temp(Src) 98.3 F (36.8 C) (Oral)  Wt 262 lb (118.842 kg)  SpO2 96% General appearance: alert, cooperative, appears stated age and no distress Ears: normal TM's and external ear canals both ears Nose: Nares normal. Septum midline. Mucosa normal. No drainage or sinus tenderness. Throat: lips, mucosa, and tongue normal; teeth and gums normal Neck: no adenopathy, supple, symmetrical, trachea midline and thyroid not enlarged, symmetric, no tenderness/mass/nodules Lungs: rales bilaterally and rhonchi bilaterally Heart: S1, S2 normal    Assessment:    Pneumonia and Flu---per [facility]   Plan:    Antibiotics per medication orders. Antitussives per medication orders. Avoid exposure to tobacco smoke and fumes. Call if shortness of breath worsens, blood in sputum, change in character of cough, development of fever or chills, inability to  maintain nutrition and hydration. Avoid exposure to tobacco smoke and fumes.

## 2013-05-01 NOTE — Progress Notes (Signed)
Pre visit review using our clinic review tool, if applicable. No additional management support is needed unless otherwise documented below in the visit note. 

## 2013-05-05 ENCOUNTER — Other Ambulatory Visit: Payer: Self-pay | Admitting: Family Medicine

## 2013-05-08 ENCOUNTER — Ambulatory Visit (INDEPENDENT_AMBULATORY_CARE_PROVIDER_SITE_OTHER): Payer: Medicare Other | Admitting: Family Medicine

## 2013-05-08 ENCOUNTER — Ambulatory Visit (HOSPITAL_BASED_OUTPATIENT_CLINIC_OR_DEPARTMENT_OTHER)
Admission: RE | Admit: 2013-05-08 | Discharge: 2013-05-08 | Disposition: A | Discharge status: Home / Self Care | Payer: Medicare Other | Source: Ambulatory Visit | Attending: Family Medicine | Admitting: Family Medicine

## 2013-05-08 ENCOUNTER — Encounter: Payer: Self-pay | Admitting: Family Medicine

## 2013-05-08 VITALS — BP 156/72 | HR 104 | Temp 98.3°F | Wt 264.0 lb

## 2013-05-08 DIAGNOSIS — J189 Pneumonia, unspecified organism: Secondary | ICD-10-CM | POA: Diagnosis not present

## 2013-05-08 NOTE — Progress Notes (Signed)
  Subjective:     Erin [name] is a 68 y.o. female here for evaluation of a cough. Onset of symptoms was 2 weeks ago. Symptoms have been gradually improving since that time. The cough is nonproductive and is aggravated by reclining position. Associated symptoms include: none--pt is better. Patient does have a history of asthma. Patient does have a history of environmental allergens. Patient has not traveled recently. Patient does not have a history of smoking. Patient has had a previous chest x-ray. Patient has not had a PPD done.  The following portions of the patient's history were reviewed and updated as appropriate: allergies, current medications, past family history, past medical history, past social history, past surgical history and problem list.  Review of Systems Pertinent items are noted in HPI.    Objective:    Oxygen saturation 95% on room air BP 156/72  Pulse 104  Temp(Src) 98.3 F (36.8 C) (Oral)  Wt 264 lb (119.75 kg)  SpO2 95% General appearance: alert, cooperative, appears stated age and no distress Neck: no adenopathy, no carotid bruit, no JVD, supple, symmetrical, trachea midline and thyroid not enlarged, symmetric, no tenderness/mass/nodules Lungs: clear to auscultation bilaterally Heart: S1, S2 normal Extremities: extremities normal, atraumatic, no cyanosis or edema    Assessment:    Pneumonia    Plan:    Antibiotics per medication orders. Antitussives per medication orders. Avoid exposure to tobacco smoke and fumes. B-agonist inhaler. Call if shortness of breath worsens, blood in sputum, change in character of cough, development of fever or chills, inability to maintain nutrition and hydration. Avoid exposure to tobacco smoke and fumes. Chest x-ray. pt improving-- f/u prn

## 2013-05-08 NOTE — Patient Instructions (Signed)

## 2013-06-10 ENCOUNTER — Encounter (HOSPITAL_COMMUNITY): Payer: Self-pay | Admitting: *Deleted

## 2013-06-11 ENCOUNTER — Ambulatory Visit: Payer: Medicare Other | Admitting: Internal Medicine

## 2013-06-12 ENCOUNTER — Ambulatory Visit: Payer: Medicare Other | Admitting: Internal Medicine

## 2013-06-12 DIAGNOSIS — R0989 Other specified symptoms and signs involving the circulatory and respiratory systems: Secondary | ICD-10-CM | POA: Diagnosis not present

## 2013-06-12 DIAGNOSIS — R0609 Other forms of dyspnea: Secondary | ICD-10-CM | POA: Diagnosis not present

## 2013-06-12 DIAGNOSIS — G4710 Hypersomnia, unspecified: Secondary | ICD-10-CM | POA: Diagnosis not present

## 2013-06-12 DIAGNOSIS — G4730 Sleep apnea, unspecified: Secondary | ICD-10-CM | POA: Diagnosis not present

## 2013-06-12 DIAGNOSIS — J310 Chronic rhinitis: Secondary | ICD-10-CM | POA: Diagnosis not present

## 2013-06-12 DIAGNOSIS — R911 Solitary pulmonary nodule: Secondary | ICD-10-CM | POA: Diagnosis not present

## 2013-06-14 DIAGNOSIS — R911 Solitary pulmonary nodule: Secondary | ICD-10-CM | POA: Diagnosis not present

## 2013-06-14 DIAGNOSIS — C3490 Malignant neoplasm of unspecified part of unspecified bronchus or lung: Secondary | ICD-10-CM | POA: Diagnosis not present

## 2013-06-19 DIAGNOSIS — J310 Chronic rhinitis: Secondary | ICD-10-CM | POA: Diagnosis not present

## 2013-06-26 DIAGNOSIS — R059 Cough, unspecified: Secondary | ICD-10-CM | POA: Diagnosis not present

## 2013-06-26 DIAGNOSIS — J310 Chronic rhinitis: Secondary | ICD-10-CM | POA: Diagnosis not present

## 2013-06-26 DIAGNOSIS — R911 Solitary pulmonary nodule: Secondary | ICD-10-CM | POA: Diagnosis not present

## 2013-06-26 DIAGNOSIS — G4710 Hypersomnia, unspecified: Secondary | ICD-10-CM | POA: Diagnosis not present

## 2013-06-26 DIAGNOSIS — R05 Cough: Secondary | ICD-10-CM | POA: Diagnosis not present

## 2013-06-26 DIAGNOSIS — R0609 Other forms of dyspnea: Secondary | ICD-10-CM | POA: Diagnosis not present

## 2013-07-03 DIAGNOSIS — J310 Chronic rhinitis: Secondary | ICD-10-CM | POA: Diagnosis not present

## 2013-07-05 ENCOUNTER — Encounter: Payer: Self-pay | Admitting: Internal Medicine

## 2013-07-05 ENCOUNTER — Ambulatory Visit (INDEPENDENT_AMBULATORY_CARE_PROVIDER_SITE_OTHER): Payer: Medicare Other | Admitting: Internal Medicine

## 2013-07-05 VITALS — BP 132/76 | HR 104 | Temp 97.9°F | Ht 64.0 in | Wt 265.0 lb

## 2013-07-05 DIAGNOSIS — IMO0001 Reserved for inherently not codable concepts without codable children: Secondary | ICD-10-CM | POA: Diagnosis not present

## 2013-07-05 DIAGNOSIS — IMO0002 Reserved for concepts with insufficient information to code with codable children: Secondary | ICD-10-CM

## 2013-07-05 DIAGNOSIS — E1165 Type 2 diabetes mellitus with hyperglycemia: Secondary | ICD-10-CM

## 2013-07-05 NOTE — Patient Instructions (Signed)
   Continue Glipizide 10 mg in am and 5 with dinner  Continue  Metformin 500 mg 2x a day  Please return in 1.5 months with your sugar log.

## 2013-07-05 NOTE — Progress Notes (Signed)
Patient ID: DESI CARBY, female   DOB: July 19, 1945, 68 y.o.   MRN: [id number]  HPI: Erin [name] is a 68 y.o.-year-old female, returning for f/u for DM2, dx 2013, non-insulin-dependent, uncontrolled, without complications. Last visit 3 mo ago.  She has an URI and also diarrhea. She is on ABx.  Last hemoglobin A1c was: Lab Results  Component Value Date   HGBA1C 7.2* 01/17/2013   HGBA1C 6.7* 11/02/2012   HGBA1C 6.6* 07/18/2012   Pt is on a regimen of: - Glipizide 10 mg in am and 5 in pm - Metformin 500 mg bid - added in 02/2013 >> tolerates the Metformin (had diarrhea and nausea when she started)  Pt checks her sugars 2 a day and they are higher (just checked and wrote down the last 2 weeks - she was sick with URI in this period...) - am: 110-160 >> 110-115 (highest 150) >> 117-148 - 2h after b'fast: 79-140 - before lunch: 124-200 >> 100-115 >> n/c - 2h after lunch: sometimes checking >> n/c >> 137-200 - before dinner: 120-200 >> 130s >> 148, 189 - 2h after dinner: up to 270 >> 130-140 >> 165, 200 - bedtime: 95-126 No lows. Lowest sugar was 79; she has hypoglycemia awareness at 70.  Highest sugar was 200.  - + CKD, last BUN/creatinine:  Lab Results  Component Value Date   BUN 21 02/05/2013   CREATININE 1.2 02/05/2013  On Lisinopril. - last set of lipids: Lab Results  Component Value Date   CHOL 160 01/17/2013   HDL 37.70* 01/17/2013   LDLCALC 81 11/02/2012   LDLDIRECT 79.2 01/17/2013   TRIG 317.0* 01/17/2013   CHOLHDL 4 01/17/2013  On Lipitor. - last eye exam was in 12/2012. No DR. Cataract >> will have surgery. - + numbness and tingling in her  big toe, not feet.   She also has a history of HL, asthma, depression, vit D def.  I reviewed pt's medications, allergies, PMH, social hx, family hx and no changes required, except as mentioned above.  ROS: Constitutional: no weight gain, + fatigue, no subjective hyperthermia/hypothermia, + poor sleep, + nocturia Eyes: +  blurry vision, no xerophthalmia ENT: no sore throat, no nodules palpated in throat, no dysphagia/odynophagia, no hoarseness Cardiovascular: no CP/+ SOB/no palpitations/+ leg swelling Respiratory: + cough/+ SOB - congestion Gastrointestinal: + N /no V/+ D/no C  Musculoskeletal: + muscle/+ joint aches Skin: no rashes Neurological: no tremors/numbness/tingling/dizziness, + HA  PE: BP 132/76  Pulse 104  Temp(Src) 97.9 F (36.6 C) (Oral)  Ht 5\' 4"  (1.626 m)  Wt 265 lb (120.203 kg)  BMI 45.46 kg/m2  SpO2 93% Wt Readings from Last 3 Encounters:  07/05/13 265 lb (120.203 kg)  05/08/13 264 lb (119.75 kg)  05/01/13 262 lb (118.842 kg)   Constitutional: obese, in NAD Eyes: PERRLA, EOMI, no exophthalmos ENT: moist mucous membranes, no thyromegaly, no cervical lymphadenopathy Cardiovascular: RRR, No MRG Respiratory: CTA B Gastrointestinal: abdomen soft, NT, ND, BS+ Musculoskeletal: no deformities, strength intact in all 4 Skin: moist, warm, no rashes  ASSESSMENT: 1. DM2, non-insulin-dependent, uncontrolled, without complications  PLAN:  1. Patient with long-standing, recently more uncontrolled diabetes - with sugars checked only in last 2 weeks, when she was sick (URI). - I suggested to:    Patient Instructions   Continue Glipizide 10 mg in am and 5 with dinner  Continue  Metformin 500 mg 2x a day  Please return in 1.5 months with your sugar log.  - continue checking  sugars at different times of the day - check 2 times a day, rotating checks - Bring log at next visit - she is up to date with yearly eye exams - check A1c today - Return to clinic in 3 mo with sugar log   Office Visit on 07/05/2013  Component Date Value Ref Range Status  . Hemoglobin A1C 07/05/2013 7.1* 4.6 - 6.5 % Final   Glycemic Control Guidelines for People with Diabetes:Non Diabetic:  <6%Goal of Therapy: <7%Additional Action Suggested:  >8%    HbA1c stable. Cont current regimen.

## 2013-07-09 LAB — HEMOGLOBIN A1C: Hgb A1c MFr Bld: 7.1 % — ABNORMAL HIGH (ref 4.6–6.5)

## 2013-07-10 DIAGNOSIS — J310 Chronic rhinitis: Secondary | ICD-10-CM | POA: Diagnosis not present

## 2013-07-17 DIAGNOSIS — J310 Chronic rhinitis: Secondary | ICD-10-CM | POA: Diagnosis not present

## 2013-07-18 ENCOUNTER — Ambulatory Visit: Payer: Medicare Other | Admitting: Family Medicine

## 2013-07-26 ENCOUNTER — Other Ambulatory Visit: Payer: Self-pay | Admitting: Internal Medicine

## 2013-07-31 DIAGNOSIS — E119 Type 2 diabetes mellitus without complications: Secondary | ICD-10-CM | POA: Diagnosis not present

## 2013-07-31 DIAGNOSIS — H02839 Dermatochalasis of unspecified eye, unspecified eyelid: Secondary | ICD-10-CM | POA: Diagnosis not present

## 2013-07-31 DIAGNOSIS — H2510 Age-related nuclear cataract, unspecified eye: Secondary | ICD-10-CM | POA: Diagnosis not present

## 2013-07-31 DIAGNOSIS — H18419 Arcus senilis, unspecified eye: Secondary | ICD-10-CM | POA: Diagnosis not present

## 2013-07-31 DIAGNOSIS — J310 Chronic rhinitis: Secondary | ICD-10-CM | POA: Diagnosis not present

## 2013-08-02 ENCOUNTER — Ambulatory Visit (INDEPENDENT_AMBULATORY_CARE_PROVIDER_SITE_OTHER): Payer: Medicare Other | Admitting: Family Medicine

## 2013-08-02 ENCOUNTER — Encounter: Payer: Self-pay | Admitting: Family Medicine

## 2013-08-02 VITALS — BP 162/74 | HR 97 | Temp 98.0°F | Wt 267.4 lb

## 2013-08-02 DIAGNOSIS — I10 Essential (primary) hypertension: Secondary | ICD-10-CM | POA: Diagnosis not present

## 2013-08-02 DIAGNOSIS — N390 Urinary tract infection, site not specified: Secondary | ICD-10-CM

## 2013-08-02 DIAGNOSIS — F3289 Other specified depressive episodes: Secondary | ICD-10-CM | POA: Diagnosis not present

## 2013-08-02 DIAGNOSIS — E1159 Type 2 diabetes mellitus with other circulatory complications: Secondary | ICD-10-CM

## 2013-08-02 DIAGNOSIS — G2581 Restless legs syndrome: Secondary | ICD-10-CM

## 2013-08-02 DIAGNOSIS — F32A Depression, unspecified: Secondary | ICD-10-CM

## 2013-08-02 DIAGNOSIS — K3189 Other diseases of stomach and duodenum: Secondary | ICD-10-CM | POA: Diagnosis not present

## 2013-08-02 DIAGNOSIS — R1013 Epigastric pain: Secondary | ICD-10-CM | POA: Diagnosis not present

## 2013-08-02 DIAGNOSIS — E785 Hyperlipidemia, unspecified: Secondary | ICD-10-CM | POA: Diagnosis not present

## 2013-08-02 DIAGNOSIS — F329 Major depressive disorder, single episode, unspecified: Secondary | ICD-10-CM

## 2013-08-02 LAB — HEPATIC FUNCTION PANEL
ALT: 18 U/L (ref 0–35)
AST: 22 U/L (ref 0–37)
Albumin: 3.8 g/dL (ref 3.5–5.2)
Alkaline Phosphatase: 104 U/L (ref 39–117)
Bilirubin, Direct: 0 mg/dL (ref 0.0–0.3)
Total Bilirubin: 0.4 mg/dL (ref 0.3–1.2)
Total Protein: 7.4 g/dL (ref 6.0–8.3)

## 2013-08-02 LAB — POCT URINALYSIS DIPSTICK
Bilirubin, UA: NEGATIVE
Blood, UA: NEGATIVE
Glucose, UA: NEGATIVE
Ketones, UA: NEGATIVE
Nitrite, UA: NEGATIVE
Protein, UA: 30
Spec Grav, UA: 1.02
Urobilinogen, UA: 0.2
pH, UA: 6

## 2013-08-02 LAB — BASIC METABOLIC PANEL
BUN: 14 mg/dL (ref 6–23)
CO2: 25 mEq/L (ref 19–32)
Calcium: 9.1 mg/dL (ref 8.4–10.5)
Chloride: 102 mEq/L (ref 96–112)
Creatinine, Ser: 0.9 mg/dL (ref 0.4–1.2)
GFR: 66.12 mL/min (ref >60.00)
Glucose, Bld: 144 mg/dL — ABNORMAL HIGH (ref 70–99)
Potassium: 3.9 mEq/L (ref 3.5–5.1)
Sodium: 137 mEq/L (ref 135–145)

## 2013-08-02 LAB — MICROALBUMIN / CREATININE URINE RATIO
Creatinine,U: 272.3 mg/dL
Microalb Creat Ratio: 9.2 mg/g (ref 0.0–30.0)
Microalb, Ur: 25.1 mg/dL — ABNORMAL HIGH (ref 0.0–1.9)

## 2013-08-02 LAB — LIPID PANEL
Cholesterol: 178 mg/dL (ref 0–200)
HDL: 38.4 mg/dL — ABNORMAL LOW (ref >39.00)
LDL Cholesterol: 86 mg/dL (ref 0–99)
Total CHOL/HDL Ratio: 5
Triglycerides: 266 mg/dL — ABNORMAL HIGH (ref 0.0–149.0)
VLDL: 53.2 mg/dL — ABNORMAL HIGH (ref 0.0–40.0)

## 2013-08-02 LAB — HEMOGLOBIN A1C: Hgb A1c MFr Bld: 6.8 % — ABNORMAL HIGH (ref 4.6–6.5)

## 2013-08-02 MED ORDER — SERTRALINE HCL 50 MG PO TABS: 50.0000 mg | ORAL_TABLET | Freq: Every day | ORAL | Status: DC

## 2013-08-02 MED ORDER — ATORVASTATIN CALCIUM 40 MG PO TABS: 40.0000 mg | ORAL_TABLET | Freq: Every day | ORAL | Status: DC

## 2013-08-02 MED ORDER — FUROSEMIDE 20 MG PO TABS: ORAL_TABLET | ORAL | Status: DC

## 2013-08-02 MED ORDER — DICLOFENAC SODIUM 75 MG PO TBEC: 75.0000 mg | DELAYED_RELEASE_TABLET | Freq: Two times a day (BID) | ORAL | Status: DC

## 2013-08-02 MED ORDER — OMEPRAZOLE 20 MG PO CPDR: 20.0000 mg | DELAYED_RELEASE_CAPSULE | Freq: Every day | ORAL | Status: DC

## 2013-08-02 MED ORDER — GABAPENTIN 300 MG PO CAPS: 300.0000 mg | ORAL_CAPSULE | Freq: Three times a day (TID) | ORAL | Status: DC

## 2013-08-02 NOTE — Addendum Note (Signed)
Addended by: Modena Morrow D on: 08/02/2013 04:01 PM   Modules accepted: Orders

## 2013-08-02 NOTE — Progress Notes (Signed)
   Subjective:    Patient ID: Erin [name], female    DOB: 08-24-1945, 68 y.o.   MRN: [id number]  Hypertension Pertinent negatives include no chest pain, headaches, palpitations or shortness of breath.  Diabetes Pertinent negatives for hypoglycemia include no dizziness or headaches. Pertinent negatives for diabetes include no chest pain, no fatigue, no polydipsia, no polyphagia and no polyuria.  Hyperlipidemia Pertinent negatives include no chest pain or shortness of breath.   HPI HYPERTENSION  Blood pressure range-not checking   Chest pain- no      Dyspnea- no Lightheadedness- no   Edema- yes Other side effects -no   Medication compliance: good Low salt diet- yes  DIABETES  Blood Sugar ranges-good per pt  Polyuria- no New Visual problems- + cataracts Hypoglycemic symptoms- no Other side effects-no Medication compliance - good Last eye exam- 07/31/2013 Foot exam- today  HYPERLIPIDEMIA  Medication compliance- good RUQ pain- no  Muscle aches- no Other side effects-no  Review of Systems  Constitutional: Negative for diaphoresis, appetite change, fatigue and unexpected weight change.  Eyes: Negative for pain, redness and visual disturbance.  Respiratory: Negative for cough, chest tightness, shortness of breath and wheezing.   Cardiovascular: Negative for chest pain, palpitations and leg swelling.  Endocrine: Negative for cold intolerance, heat intolerance, polydipsia, polyphagia and polyuria.  Genitourinary: Negative for dysuria, frequency and difficulty urinating.  Neurological: Negative for dizziness, light-headedness, numbness and headaches.       Objective:   Physical Exam  Constitutional: She is oriented to person, place, and time. She appears well-developed and well-nourished.  HENT:  Head: Normocephalic and atraumatic.  Eyes: Conjunctivae and EOM are normal.  Neck: Normal range of motion. Neck supple. No JVD present. Carotid bruit is not present. No  thyromegaly present.  Cardiovascular: Normal rate, regular rhythm and normal heart sounds.   No murmur heard. Pulmonary/Chest: Effort normal and breath sounds normal. No respiratory distress. She has no wheezes. She has no rales. She exhibits no tenderness.  Musculoskeletal: She exhibits no edema.  Neurological: She is alert and oriented to person, place, and time.  Psychiatric: She has a normal mood and affect.   Sensory exam of the foot is normal, tested with the monofilament. Good pulses, no lesions or ulcers, good peripheral pulses.       Assessment & Plan:  1. RLS (restless legs syndrome)  - gabapentin (NEURONTIN) 300 MG capsule; Take 1 capsule (300 mg total) by mouth 3 (three) times daily.  Dispense: 90 capsule; Refill: 11  2. Depression  - sertraline (ZOLOFT) 50 MG tablet; Take 1 tablet (50 mg total) by mouth daily.  Dispense: 90 tablet; Refill: 3  3. Dyspepsia  - omeprazole (PRILOSEC) 20 MG capsule; Take 1 capsule (20 mg total) by mouth daily.  Dispense: 90 capsule; Refill: 3  4. Type II or unspecified type diabetes mellitus with peripheral circulatory disorders, uncontrolled(250.72) Check labs ]con't meds - Hemoglobin A1c - Microalbumin / creatinine urine ratio - POCT urinalysis dipstick  5. Other and unspecified hyperlipidemia Check labs - atorvastatin (LIPITOR) 40 MG tablet; Take 1 tablet (40 mg total) by mouth daily.  Dispense: 30 tablet; Refill: 5 - Hepatic function panel - Lipid panel - Microalbumin / creatinine urine ratio - POCT urinalysis dipstick  6. HTN (hypertension) Stable con't meds - furosemide (LASIX) 20 MG tablet; TAKE ONE TABLET BY MOUTH TWICE DAILY  Dispense: 60 tablet; Refill: 5 - Basic metabolic panel - Microalbumin / creatinine urine ratio - POCT urinalysis dipstick

## 2013-08-02 NOTE — Progress Notes (Signed)
Pre visit review using our clinic review tool, if applicable. No additional management support is needed unless otherwise documented below in the visit note. 

## 2013-08-02 NOTE — Patient Instructions (Signed)

## 2013-08-03 ENCOUNTER — Telehealth: Payer: Self-pay | Admitting: Family Medicine

## 2013-08-03 DIAGNOSIS — Z79899 Other long term (current) drug therapy: Secondary | ICD-10-CM | POA: Diagnosis not present

## 2013-08-03 NOTE — Telephone Encounter (Signed)
emmi mailed to patient

## 2013-08-04 LAB — URINE CULTURE: Colony Count: 4000

## 2013-08-07 DIAGNOSIS — J310 Chronic rhinitis: Secondary | ICD-10-CM | POA: Diagnosis not present

## 2013-08-08 ENCOUNTER — Other Ambulatory Visit: Payer: Self-pay | Admitting: Orthopaedic Surgery

## 2013-08-08 DIAGNOSIS — M4120 Other idiopathic scoliosis, site unspecified: Secondary | ICD-10-CM | POA: Diagnosis not present

## 2013-08-08 DIAGNOSIS — M47817 Spondylosis without myelopathy or radiculopathy, lumbosacral region: Secondary | ICD-10-CM | POA: Diagnosis not present

## 2013-08-08 DIAGNOSIS — M47816 Spondylosis without myelopathy or radiculopathy, lumbar region: Secondary | ICD-10-CM

## 2013-08-11 ENCOUNTER — Ambulatory Visit
Admission: RE | Admit: 2013-08-11 | Discharge: 2013-08-11 | Disposition: A | Discharge status: Home / Self Care | Payer: Medicare Other | Source: Ambulatory Visit | Attending: Orthopaedic Surgery | Admitting: Orthopaedic Surgery

## 2013-08-11 DIAGNOSIS — M47816 Spondylosis without myelopathy or radiculopathy, lumbar region: Secondary | ICD-10-CM

## 2013-08-11 DIAGNOSIS — M48061 Spinal stenosis, lumbar region without neurogenic claudication: Secondary | ICD-10-CM | POA: Diagnosis not present

## 2013-08-11 DIAGNOSIS — M47817 Spondylosis without myelopathy or radiculopathy, lumbosacral region: Secondary | ICD-10-CM | POA: Diagnosis not present

## 2013-08-14 ENCOUNTER — Telehealth: Payer: Self-pay

## 2013-08-14 DIAGNOSIS — J310 Chronic rhinitis: Secondary | ICD-10-CM | POA: Diagnosis not present

## 2013-08-14 NOTE — Telephone Encounter (Signed)
Relevant patient education mailed to patient.  

## 2013-08-16 ENCOUNTER — Ambulatory Visit: Payer: Medicare Other | Admitting: Internal Medicine

## 2013-08-23 DIAGNOSIS — J310 Chronic rhinitis: Secondary | ICD-10-CM | POA: Diagnosis not present

## 2013-08-28 DIAGNOSIS — J310 Chronic rhinitis: Secondary | ICD-10-CM | POA: Diagnosis not present

## 2013-08-30 ENCOUNTER — Encounter: Payer: Self-pay | Admitting: Family Medicine

## 2013-09-04 DIAGNOSIS — M47817 Spondylosis without myelopathy or radiculopathy, lumbosacral region: Secondary | ICD-10-CM | POA: Diagnosis not present

## 2013-09-04 DIAGNOSIS — M545 Low back pain, unspecified: Secondary | ICD-10-CM | POA: Diagnosis not present

## 2013-09-04 DIAGNOSIS — M48061 Spinal stenosis, lumbar region without neurogenic claudication: Secondary | ICD-10-CM | POA: Diagnosis not present

## 2013-09-04 DIAGNOSIS — M4120 Other idiopathic scoliosis, site unspecified: Secondary | ICD-10-CM | POA: Diagnosis not present

## 2013-09-05 DIAGNOSIS — J310 Chronic rhinitis: Secondary | ICD-10-CM | POA: Diagnosis not present

## 2013-09-06 DIAGNOSIS — Z1231 Encounter for screening mammogram for malignant neoplasm of breast: Secondary | ICD-10-CM | POA: Diagnosis not present

## 2013-09-10 DIAGNOSIS — H2510 Age-related nuclear cataract, unspecified eye: Secondary | ICD-10-CM | POA: Diagnosis not present

## 2013-09-10 DIAGNOSIS — H269 Unspecified cataract: Secondary | ICD-10-CM | POA: Diagnosis not present

## 2013-09-11 DIAGNOSIS — H2510 Age-related nuclear cataract, unspecified eye: Secondary | ICD-10-CM | POA: Diagnosis not present

## 2013-09-14 ENCOUNTER — Encounter: Payer: Self-pay | Admitting: Family Medicine

## 2013-09-18 DIAGNOSIS — J310 Chronic rhinitis: Secondary | ICD-10-CM | POA: Diagnosis not present

## 2013-09-20 ENCOUNTER — Ambulatory Visit (INDEPENDENT_AMBULATORY_CARE_PROVIDER_SITE_OTHER): Payer: Medicare Other | Admitting: Internal Medicine

## 2013-09-20 ENCOUNTER — Encounter: Payer: Self-pay | Admitting: Internal Medicine

## 2013-09-20 ENCOUNTER — Ambulatory Visit: Payer: Medicare Other | Admitting: Internal Medicine

## 2013-09-20 VITALS — BP 140/74 | HR 110 | Temp 98.0°F | Resp 16 | Wt 268.0 lb

## 2013-09-20 DIAGNOSIS — IMO0001 Reserved for inherently not codable concepts without codable children: Secondary | ICD-10-CM

## 2013-09-20 DIAGNOSIS — E1165 Type 2 diabetes mellitus with hyperglycemia: Secondary | ICD-10-CM

## 2013-09-20 DIAGNOSIS — IMO0002 Reserved for concepts with insufficient information to code with codable children: Secondary | ICD-10-CM

## 2013-09-20 NOTE — Patient Instructions (Signed)
   Continue Glipizide 10 mg in am and 5 with dinner  Continue  Metformin 500 mg 2x a day  You are cleared for the back surgery from the diabetes point of view.   Please come back for a follow-up appointment in 3 months

## 2013-09-20 NOTE — Progress Notes (Signed)
Patient ID: Erin [name], female   DOB: 03-21-1946, 68 y.o.   MRN: [id number]  HPI: Erin [name] is a 68 y.o.-year-old female, returning for f/u for DM2, dx 2013, non-insulin-dependent, uncontrolled, without complications. Last visit 2.5 mo ago.  Last hemoglobin A1c was: Lab Results  Component Value Date   HGBA1C 6.8* 08/02/2013   HGBA1C 7.1* 07/05/2013   HGBA1C 7.2* 01/17/2013   Pt is on a regimen of: - Glipizide 10 mg in am and 5 in pm - Metformin 500 mg bid - added in 02/2013 >> tolerates it well (had diarrhea and nausea when she started)  Pt checks her sugars 1-2 a day and they are about the same as before: - am: 110-160 >> 110-115 (highest 150) >> 117-148 >> 100-140 - 2h after b'fast: 79-140 >> <140 - before lunch: 124-200 >> 100-115 >> n/c - 2h after lunch: sometimes checking >> n/c >> 137-200 >> 150-170s - before dinner: 120-200 >> 130s >> 148, 189 >> n/c - 2h after dinner: up to 270 >> 130-140 >> 165, 200 >> n/c - bedtime: 95-126 >> n/c No lows. Lowest sugar was 90s; she has hypoglycemia awareness at 70.  Highest sugar was 260 (overate).  - + CKD, last BUN/creatinine:  Lab Results  Component Value Date   BUN 14 08/02/2013   CREATININE 0.9 08/02/2013  On Lisinopril. - last set of lipids: Lab Results  Component Value Date   CHOL 178 08/02/2013   HDL 38.40* 08/02/2013   LDLCALC 86 08/02/2013   LDLDIRECT 79.2 01/17/2013   TRIG 266.0* 08/02/2013   CHOLHDL 5 08/02/2013  On Lipitor. - last eye exam was in 12/2012. No DR. Cataract >> had R eye surgery, will have the L cataract removed soon. - + numbness and tingling in her  big toe, not feet. She has swelling in feet 2/2 heat.   She also has a history of HL, asthma, depression, vit D def. She has back pain >> will have vertebral fusion next months.  I reviewed pt's medications, allergies, PMH, social hx, family hx and no changes required, except as mentioned above.  ROS: Constitutional: no weight gain, + fatigue, no  subjective hyperthermia/hypothermia, + poor sleep, + excessive urination Eyes: + blurry vision, no xerophthalmia ENT: no sore throat, no nodules palpated in throat, no dysphagia/odynophagia, no hoarseness Cardiovascular: no CP/+ SOB/no palpitations/+ leg swelling Respiratory: no cough/+ SOB Gastrointestinal: + N /no V/no D/no C  Musculoskeletal: + muscle aches /no joint aches Skin: no rashes Neurological: no tremors/numbness/tingling/dizziness  PE: BP 140/74  Pulse 110  Temp(Src) 98 F (36.7 C) (Oral)  Resp 16  Wt 268 lb (121.564 kg)  SpO2 97% Wt Readings from Last 3 Encounters:  09/20/13 268 lb (121.564 kg)  08/02/13 267 lb 6.4 oz (121.292 kg)  07/05/13 265 lb (120.203 kg)   Constitutional: obese, in NAD Eyes: PERRLA, EOMI, no exophthalmos ENT: moist mucous membranes, no thyromegaly, no cervical lymphadenopathy Cardiovascular: RRR, No MRG Respiratory: CTA B Gastrointestinal: abdomen soft, NT, ND, BS+ Musculoskeletal: no deformities, strength intact in all 4 Skin: moist, warm, no rashes  ASSESSMENT: 1. DM2, non-insulin-dependent, now controlled, without complications  PLAN:  1. Patient with long-standing, recently more controlled diabetes. - I suggested to:  Patient Instructions    Continue Glipizide 10 mg in am and 5 with dinner  Continue  Metformin 500 mg 2x a day You are cleared for the back surgery from the diabetes point of view.  Please come back for a follow-up appointment  in 3 months  - we discussed about improving her diet - given specific examples - continue checking sugars at different times of the day - check 2 times a day, rotating checks - Bring log at next visit - she is up to date with yearly eye exams - check A1c at next visit - Return to clinic in 3 mo with sugar log   CC Dr Rennis Harding

## 2013-09-21 DIAGNOSIS — H04129 Dry eye syndrome of unspecified lacrimal gland: Secondary | ICD-10-CM | POA: Diagnosis not present

## 2013-10-01 DIAGNOSIS — H269 Unspecified cataract: Secondary | ICD-10-CM | POA: Diagnosis not present

## 2013-10-01 DIAGNOSIS — H2510 Age-related nuclear cataract, unspecified eye: Secondary | ICD-10-CM | POA: Diagnosis not present

## 2013-10-04 DIAGNOSIS — J310 Chronic rhinitis: Secondary | ICD-10-CM | POA: Diagnosis not present

## 2013-10-09 DIAGNOSIS — J310 Chronic rhinitis: Secondary | ICD-10-CM | POA: Diagnosis not present

## 2013-10-16 DIAGNOSIS — J310 Chronic rhinitis: Secondary | ICD-10-CM | POA: Diagnosis not present

## 2013-10-18 ENCOUNTER — Ambulatory Visit: Payer: Medicare Other | Admitting: Family Medicine

## 2013-10-22 ENCOUNTER — Ambulatory Visit: Payer: Medicare Other

## 2013-10-22 ENCOUNTER — Telehealth: Payer: Self-pay | Admitting: Family Medicine

## 2013-10-22 VITALS — BP 164/72 | HR 105

## 2013-10-22 DIAGNOSIS — Z981 Arthrodesis status: Secondary | ICD-10-CM | POA: Diagnosis not present

## 2013-10-22 DIAGNOSIS — M4120 Other idiopathic scoliosis, site unspecified: Secondary | ICD-10-CM | POA: Diagnosis not present

## 2013-10-22 DIAGNOSIS — D62 Acute posthemorrhagic anemia: Secondary | ICD-10-CM | POA: Diagnosis not present

## 2013-10-22 DIAGNOSIS — Z6841 Body Mass Index (BMI) 40.0 and over, adult: Secondary | ICD-10-CM | POA: Diagnosis not present

## 2013-10-22 DIAGNOSIS — R030 Elevated blood-pressure reading, without diagnosis of hypertension: Principal | ICD-10-CM

## 2013-10-22 DIAGNOSIS — E872 Acidosis, unspecified: Secondary | ICD-10-CM | POA: Diagnosis not present

## 2013-10-22 DIAGNOSIS — N179 Acute kidney failure, unspecified: Secondary | ICD-10-CM | POA: Diagnosis not present

## 2013-10-22 DIAGNOSIS — J9600 Acute respiratory failure, unspecified whether with hypoxia or hypercapnia: Secondary | ICD-10-CM | POA: Diagnosis not present

## 2013-10-22 DIAGNOSIS — M4310 Spondylolisthesis, site unspecified: Secondary | ICD-10-CM | POA: Diagnosis not present

## 2013-10-22 DIAGNOSIS — J9819 Other pulmonary collapse: Secondary | ICD-10-CM | POA: Diagnosis not present

## 2013-10-22 DIAGNOSIS — M47817 Spondylosis without myelopathy or radiculopathy, lumbosacral region: Secondary | ICD-10-CM | POA: Diagnosis not present

## 2013-10-22 DIAGNOSIS — M545 Low back pain, unspecified: Secondary | ICD-10-CM | POA: Diagnosis not present

## 2013-10-22 DIAGNOSIS — M48061 Spinal stenosis, lumbar region without neurogenic claudication: Secondary | ICD-10-CM | POA: Diagnosis not present

## 2013-10-22 DIAGNOSIS — N390 Urinary tract infection, site not specified: Secondary | ICD-10-CM | POA: Diagnosis not present

## 2013-10-22 DIAGNOSIS — IMO0001 Reserved for inherently not codable concepts without codable children: Secondary | ICD-10-CM

## 2013-10-22 NOTE — Telephone Encounter (Signed)
Spoke with patient who reported elevated blood pressure (see below for details).  She denies chest pain, dizziness, blurry vision, weakness different from normal, shortness of breath, unsteady gait, or changes in mental status.  Stated that she felt fine.   Appointments offered.  Pt has an appointment scheduled today at the [facility] at 1:30 pm for preliminary test for surgery.  Pt was advised to come in today at 3:30 pm for a nurse visit--blood pressure check.  Pt agreed.

## 2013-10-22 NOTE — Progress Notes (Signed)
BP assessed--164/72.  Last OV: 08/02/13---BP was 162/74.  Pt denied chest pain, dizziness, headache, weakness, and blurry vision.  Color-normal.  A&O x 3.  Steady gait.  No slurred speech.    Pt was advised to continue taking BP medications as prescribed, limit salt intake, and stay hydrated.  Pt has an appointment with Dr. Etter Sjogren on 10/24/13 at 11:30.  Pt was encouraged to keep appointment as scheduled.  Pt agreed.

## 2013-10-22 NOTE — Telephone Encounter (Signed)
Addendum to note below. Pt does not have any cardiac or neurological  symptoms which changes this disposition from ED to "pt wants to be seen today".

## 2013-10-22 NOTE — Telephone Encounter (Signed)
Patient Information:  Caller Name: [facility]  Phone: [phone]  Patient: Erin [name], Erin [name]  Gender: Female  DOB: 05-Sep-1945  Age: 68 Years  PCP: Rosalita Chessman.  Office Follow Up:  Does the office need to follow up with this patient?: Yes  Instructions For The Office: Please advised if you want this pt to go to ED ? or wait until Wed for appt. Pt has no home BP monitoring system but can get one today if so advised.   Symptoms  Reason For Call & Symptoms: Pt was at the surgeons office today  for a pre authorization visit for surgery on Monday  10/22/13. Her BP at this office was 190/66 and then 181/104. Pt was nervous but surgeon advised she see her PCP today. Pt does have an appt on WEd. Dr. Etter Sjogren is not in the office today.  Reviewed Health History In EMR: Yes  Reviewed Medications In EMR: Yes  Reviewed Allergies In EMR: Yes  Reviewed Surgeries / Procedures: Yes  Date of Onset of Symptoms: 10/22/2013  Guideline(s) Used:  High Blood Pressure  Disposition Per Guideline:   Go to ED Now (or to Office with PCP Approval)  Reason For Disposition Reached:   BP > 160 / 100 and any cardiac or neurologic symptoms (e.g., chest pain, difficulty breathing, unsteady gait, blurred vision)  Advice Given:  N/A  Patient Will Follow Care Advice:  YES

## 2013-10-23 ENCOUNTER — Other Ambulatory Visit: Payer: Self-pay | Admitting: Family Medicine

## 2013-10-23 DIAGNOSIS — J310 Chronic rhinitis: Secondary | ICD-10-CM | POA: Diagnosis not present

## 2013-10-24 ENCOUNTER — Ambulatory Visit: Payer: Medicare Other | Admitting: Family Medicine

## 2013-10-24 NOTE — Telephone Encounter (Signed)
Refill Request:  pramipexole (MIRAPEX) 1 MG tablet--TAKE ONE TABLET BY MOUTH AT BEDTIME  Last OV:  08/02/13--[facility]S  Medication filled for 30 tablets, 5 refills.

## 2013-10-25 ENCOUNTER — Encounter: Payer: Self-pay | Admitting: Family Medicine

## 2013-10-25 ENCOUNTER — Ambulatory Visit (HOSPITAL_BASED_OUTPATIENT_CLINIC_OR_DEPARTMENT_OTHER)
Admission: RE | Admit: 2013-10-25 | Discharge: 2013-10-25 | Disposition: A | Discharge status: Home / Self Care | Payer: Medicare Other | Source: Ambulatory Visit | Attending: Family Medicine | Admitting: Family Medicine

## 2013-10-25 ENCOUNTER — Ambulatory Visit (INDEPENDENT_AMBULATORY_CARE_PROVIDER_SITE_OTHER): Payer: Medicare Other | Admitting: Family Medicine

## 2013-10-25 VITALS — BP 140/84 | HR 93 | Temp 97.8°F | Resp 16 | Wt 270.2 lb

## 2013-10-25 DIAGNOSIS — Z01818 Encounter for other preprocedural examination: Secondary | ICD-10-CM | POA: Insufficient documentation

## 2013-10-25 MED ORDER — METOPROLOL TARTRATE 25 MG PO TABS: 25.0000 mg | ORAL_TABLET | Freq: Two times a day (BID) | ORAL | Status: DC

## 2013-10-25 NOTE — Progress Notes (Signed)
Pre visit review using our clinic review tool, if applicable. No additional management support is needed unless otherwise documented below in the visit note. 

## 2013-10-25 NOTE — Progress Notes (Signed)
Subjective:    Erin [name] is a 68 y.o. female who presents to the office today for a preoperative consultation at the request of surgeon Dr Patrice Paradise who plans on performing lumbar spinal fusion on July 27. This consultation is requested for the specific conditions prompting preoperative evaluation (i.e. because of potential affect on operative risk): HTN, COPD. Planned anesthesia: general. The patient has the following known anesthesia issues: no previous anesthesia complications. Patients bleeding risk: no recent abnormal bleeding, no remote history of abnormal bleeding and hx of thrombocytopenia. Patient does not have objections to receiving blood products if needed.  The following portions of the patient's history were reviewed and updated as appropriate: allergies, current medications, past family history, past medical history, past social history, past surgical history and problem list.  Review of Systems no CP, SOB above baseline, HAs, visual changes, dysphagia, abd pain, N/V, dizziness    Objective:      General Appearance:    Alert, cooperative, no distress, appears stated age, obese  Head:    Normocephalic, without obvious abnormality, atraumatic  Eyes:    PERRL, conjunctiva/corneas clear, EOM's intact, fundi    benign, both eyes  Ears:    Normal TM's and external ear canals, both ears  Nose:   Nares normal, septum midline, mucosa normal, no drainage    or sinus tenderness  Throat:   Lips, mucosa, and tongue normal; loose R upper tooth, scattered missing teeth  Neck:   Supple, symmetrical, trachea midline, no adenopathy;    Thyroid: no enlargement/tenderness/nodules  Back:     Symmetric, no curvature, ROM normal, no CVA tenderness  Lungs:     Clear to auscultation bilaterally, respirations unlabored  Chest Wall:    No tenderness or deformity   Heart:    Regular rate and rhythm, S1 and S2 normal, no murmur, rub   or gallop  Breast Exam:    Deferred to GYN  Abdomen:     Soft,  non-tender, bowel sounds active all four quadrants,    no masses, no organomegaly  Genitalia:    Deferred to GYN  Rectal:    Extremities:   Extremities normal, atraumatic, no cyanosis or edema  Pulses:   2+ and symmetric all extremities  Skin:   Skin color, texture, turgor normal, no rashes or lesions  Lymph nodes:   Cervical, supraclavicular, and axillary nodes normal  Neurologic:   CNII-XII intact, normal strength, sensation and reflexes    throughout     Predictors of intubation difficulty:  Morbid obesity? yes  Anatomically abnormal facies? no  Prominent incisors? no  Receding mandible? yes  Short, thick neck? yes  Neck range of motion: normal  Dentition: Loose teeth present (R upper tooth is loose) and Missing teeth (scattered s/p removal)  Cardiographics ECG: no change since previous ECG dated 04/26/13 Echocardiogram: not done  Imaging Chest x-ray: no acute process  Lab Review  Labs normal w/ exception of mildly elevated Alk phos 126, and chronic anemia w/ stable Hgb 9.7      Assessment:      68 y.o. female with planned surgery as above.   Known risk factors for perioperative complications: Chronic pulmonary disease Diabetes mellitus uncontrolled HTN   Difficulty with intubation is not anticipated based on hx of previous anesthesia w/o difficulty.  Cardiac Risk Estimation: moderate  Current medications which may produce withdrawal symptoms if withheld perioperatively: none      Plan:    1. Preoperative workup as follows chest x-ray,  ECG, labs. 2. Change in medication regimen before surgery: discontinue Metformin 24 hours before surgery. 3. Prophylaxis for cardiac events with perioperative beta-blockers: pt to start beta blockers today for elevated BP. 4. Invasive hemodynamic monitoring perioperatively: at the discretion of anesthesiologist. 5. Deep vein thrombosis prophylaxis postoperatively:regimen to be chosen by surgical team. 6. Surveillance for  postoperative MI with ECG immediately postoperatively and on postoperative days 1 and 2 AND troponin levels 24 hours postoperatively and on day 4 or hospital discharge (whichever comes first): at the discretion of anesthesiologist.

## 2013-10-25 NOTE — Patient Instructions (Signed)
[facility] START the Metoprolol twice daily to better control BP and heart rate Continue the Lisinopril daily DO NOT TAKE your metformin on Sunday or Monday Drink plenty of fluids DO NOT take the Celebrex or Diclofenac- this will increase your risk of bleeding I'll send a copy of today's note to Dr Patrice Paradise Call with any questions or concerns GOOD LUCK!!

## 2013-10-28 NOTE — Assessment & Plan Note (Signed)
New.  Pt's BP is mildly elevated.  Will start low dose beta blocker to improve BP control prior to surgery.  CXR negative for acute process, labs received from [facility] are stable.  Pt is cleared to proceed.

## 2013-10-29 DIAGNOSIS — R0602 Shortness of breath: Secondary | ICD-10-CM | POA: Diagnosis not present

## 2013-10-29 DIAGNOSIS — Z5189 Encounter for other specified aftercare: Secondary | ICD-10-CM | POA: Diagnosis not present

## 2013-10-29 DIAGNOSIS — R569 Unspecified convulsions: Secondary | ICD-10-CM | POA: Diagnosis not present

## 2013-10-29 DIAGNOSIS — J9819 Other pulmonary collapse: Secondary | ICD-10-CM | POA: Diagnosis not present

## 2013-10-29 DIAGNOSIS — Z4682 Encounter for fitting and adjustment of non-vascular catheter: Secondary | ICD-10-CM | POA: Diagnosis not present

## 2013-10-29 DIAGNOSIS — F29 Unspecified psychosis not due to a substance or known physiological condition: Secondary | ICD-10-CM | POA: Diagnosis not present

## 2013-10-29 DIAGNOSIS — R4182 Altered mental status, unspecified: Secondary | ICD-10-CM | POA: Diagnosis not present

## 2013-10-29 DIAGNOSIS — G9340 Encephalopathy, unspecified: Secondary | ICD-10-CM | POA: Diagnosis not present

## 2013-10-29 DIAGNOSIS — J45909 Unspecified asthma, uncomplicated: Secondary | ICD-10-CM | POA: Diagnosis present

## 2013-10-29 DIAGNOSIS — M4716 Other spondylosis with myelopathy, lumbar region: Secondary | ICD-10-CM | POA: Diagnosis not present

## 2013-10-29 DIAGNOSIS — M48062 Spinal stenosis, lumbar region with neurogenic claudication: Secondary | ICD-10-CM | POA: Diagnosis not present

## 2013-10-29 DIAGNOSIS — N390 Urinary tract infection, site not specified: Secondary | ICD-10-CM | POA: Diagnosis not present

## 2013-10-29 DIAGNOSIS — I509 Heart failure, unspecified: Secondary | ICD-10-CM | POA: Diagnosis not present

## 2013-10-29 DIAGNOSIS — E872 Acidosis, unspecified: Secondary | ICD-10-CM | POA: Diagnosis not present

## 2013-10-29 DIAGNOSIS — M961 Postlaminectomy syndrome, not elsewhere classified: Secondary | ICD-10-CM | POA: Diagnosis not present

## 2013-10-29 DIAGNOSIS — J9600 Acute respiratory failure, unspecified whether with hypoxia or hypercapnia: Secondary | ICD-10-CM | POA: Diagnosis not present

## 2013-10-29 DIAGNOSIS — F430 Acute stress reaction: Secondary | ICD-10-CM | POA: Diagnosis not present

## 2013-10-29 DIAGNOSIS — I129 Hypertensive chronic kidney disease with stage 1 through stage 4 chronic kidney disease, or unspecified chronic kidney disease: Secondary | ICD-10-CM | POA: Diagnosis present

## 2013-10-29 DIAGNOSIS — I10 Essential (primary) hypertension: Secondary | ICD-10-CM | POA: Diagnosis not present

## 2013-10-29 DIAGNOSIS — R0609 Other forms of dyspnea: Secondary | ICD-10-CM | POA: Diagnosis not present

## 2013-10-29 DIAGNOSIS — M4120 Other idiopathic scoliosis, site unspecified: Secondary | ICD-10-CM | POA: Diagnosis present

## 2013-10-29 DIAGNOSIS — E873 Alkalosis: Secondary | ICD-10-CM | POA: Diagnosis not present

## 2013-10-29 DIAGNOSIS — Z452 Encounter for adjustment and management of vascular access device: Secondary | ICD-10-CM | POA: Diagnosis not present

## 2013-10-29 DIAGNOSIS — Z6841 Body Mass Index (BMI) 40.0 and over, adult: Secondary | ICD-10-CM | POA: Diagnosis not present

## 2013-10-29 DIAGNOSIS — R0902 Hypoxemia: Secondary | ICD-10-CM | POA: Diagnosis not present

## 2013-10-29 DIAGNOSIS — M549 Dorsalgia, unspecified: Secondary | ICD-10-CM | POA: Diagnosis not present

## 2013-10-29 DIAGNOSIS — R5381 Other malaise: Secondary | ICD-10-CM | POA: Diagnosis not present

## 2013-10-29 DIAGNOSIS — N183 Chronic kidney disease, stage 3 unspecified: Secondary | ICD-10-CM | POA: Diagnosis present

## 2013-10-29 DIAGNOSIS — M4310 Spondylolisthesis, site unspecified: Secondary | ICD-10-CM | POA: Diagnosis not present

## 2013-10-29 DIAGNOSIS — Z981 Arthrodesis status: Secondary | ICD-10-CM | POA: Diagnosis not present

## 2013-10-29 DIAGNOSIS — N179 Acute kidney failure, unspecified: Secondary | ICD-10-CM | POA: Diagnosis not present

## 2013-10-29 DIAGNOSIS — D62 Acute posthemorrhagic anemia: Secondary | ICD-10-CM | POA: Diagnosis not present

## 2013-10-29 DIAGNOSIS — E876 Hypokalemia: Secondary | ICD-10-CM | POA: Diagnosis not present

## 2013-10-29 DIAGNOSIS — B9689 Other specified bacterial agents as the cause of diseases classified elsewhere: Secondary | ICD-10-CM | POA: Diagnosis not present

## 2013-10-29 DIAGNOSIS — N170 Acute kidney failure with tubular necrosis: Secondary | ICD-10-CM | POA: Diagnosis not present

## 2013-10-29 DIAGNOSIS — G4730 Sleep apnea, unspecified: Secondary | ICD-10-CM | POA: Diagnosis not present

## 2013-10-29 DIAGNOSIS — E119 Type 2 diabetes mellitus without complications: Secondary | ICD-10-CM | POA: Diagnosis present

## 2013-10-29 DIAGNOSIS — D649 Anemia, unspecified: Secondary | ICD-10-CM | POA: Diagnosis not present

## 2013-10-29 DIAGNOSIS — N269 Renal sclerosis, unspecified: Secondary | ICD-10-CM | POA: Diagnosis not present

## 2013-10-29 DIAGNOSIS — F05 Delirium due to known physiological condition: Secondary | ICD-10-CM | POA: Diagnosis not present

## 2013-10-29 DIAGNOSIS — M79609 Pain in unspecified limb: Secondary | ICD-10-CM | POA: Diagnosis not present

## 2013-10-29 DIAGNOSIS — G4733 Obstructive sleep apnea (adult) (pediatric): Secondary | ICD-10-CM | POA: Diagnosis present

## 2013-10-29 DIAGNOSIS — M5137 Other intervertebral disc degeneration, lumbosacral region: Secondary | ICD-10-CM | POA: Diagnosis not present

## 2013-11-03 HISTORY — PX: SPINE SURGERY: SHX786

## 2013-11-09 DIAGNOSIS — M109 Gout, unspecified: Secondary | ICD-10-CM | POA: Diagnosis not present

## 2013-11-09 DIAGNOSIS — B9689 Other specified bacterial agents as the cause of diseases classified elsewhere: Secondary | ICD-10-CM | POA: Diagnosis not present

## 2013-11-09 DIAGNOSIS — N179 Acute kidney failure, unspecified: Secondary | ICD-10-CM | POA: Diagnosis not present

## 2013-11-09 DIAGNOSIS — F329 Major depressive disorder, single episode, unspecified: Secondary | ICD-10-CM | POA: Diagnosis not present

## 2013-11-09 DIAGNOSIS — F411 Generalized anxiety disorder: Secondary | ICD-10-CM | POA: Diagnosis not present

## 2013-11-09 DIAGNOSIS — M4800 Spinal stenosis, site unspecified: Secondary | ICD-10-CM | POA: Diagnosis not present

## 2013-11-09 DIAGNOSIS — E1149 Type 2 diabetes mellitus with other diabetic neurological complication: Secondary | ICD-10-CM | POA: Diagnosis not present

## 2013-11-09 DIAGNOSIS — E1142 Type 2 diabetes mellitus with diabetic polyneuropathy: Secondary | ICD-10-CM | POA: Diagnosis not present

## 2013-11-09 DIAGNOSIS — E876 Hypokalemia: Secondary | ICD-10-CM | POA: Diagnosis not present

## 2013-11-09 DIAGNOSIS — F3289 Other specified depressive episodes: Secondary | ICD-10-CM | POA: Diagnosis not present

## 2013-11-09 DIAGNOSIS — D649 Anemia, unspecified: Secondary | ICD-10-CM | POA: Diagnosis not present

## 2013-11-09 DIAGNOSIS — I10 Essential (primary) hypertension: Secondary | ICD-10-CM | POA: Diagnosis not present

## 2013-11-09 DIAGNOSIS — N390 Urinary tract infection, site not specified: Secondary | ICD-10-CM | POA: Diagnosis not present

## 2013-11-09 DIAGNOSIS — E873 Alkalosis: Secondary | ICD-10-CM | POA: Diagnosis not present

## 2013-11-09 DIAGNOSIS — G4733 Obstructive sleep apnea (adult) (pediatric): Secondary | ICD-10-CM | POA: Diagnosis not present

## 2013-11-09 DIAGNOSIS — B961 Klebsiella pneumoniae [K. pneumoniae] as the cause of diseases classified elsewhere: Secondary | ICD-10-CM | POA: Diagnosis not present

## 2013-11-09 DIAGNOSIS — K5909 Other constipation: Secondary | ICD-10-CM | POA: Diagnosis not present

## 2013-11-09 DIAGNOSIS — N170 Acute kidney failure with tubular necrosis: Secondary | ICD-10-CM | POA: Diagnosis not present

## 2013-11-09 DIAGNOSIS — R5381 Other malaise: Secondary | ICD-10-CM | POA: Diagnosis not present

## 2013-11-09 DIAGNOSIS — Z5189 Encounter for other specified aftercare: Secondary | ICD-10-CM | POA: Diagnosis not present

## 2013-11-09 DIAGNOSIS — Z981 Arthrodesis status: Secondary | ICD-10-CM | POA: Diagnosis not present

## 2013-11-09 DIAGNOSIS — E785 Hyperlipidemia, unspecified: Secondary | ICD-10-CM | POA: Diagnosis not present

## 2013-11-12 DIAGNOSIS — E785 Hyperlipidemia, unspecified: Secondary | ICD-10-CM | POA: Diagnosis present

## 2013-11-12 DIAGNOSIS — F3289 Other specified depressive episodes: Secondary | ICD-10-CM | POA: Diagnosis not present

## 2013-11-12 DIAGNOSIS — R0602 Shortness of breath: Secondary | ICD-10-CM | POA: Diagnosis not present

## 2013-11-12 DIAGNOSIS — Z5189 Encounter for other specified aftercare: Secondary | ICD-10-CM | POA: Diagnosis not present

## 2013-11-12 DIAGNOSIS — E119 Type 2 diabetes mellitus without complications: Secondary | ICD-10-CM | POA: Diagnosis present

## 2013-11-12 DIAGNOSIS — F329 Major depressive disorder, single episode, unspecified: Secondary | ICD-10-CM | POA: Diagnosis present

## 2013-11-12 DIAGNOSIS — I2789 Other specified pulmonary heart diseases: Secondary | ICD-10-CM | POA: Diagnosis not present

## 2013-11-12 DIAGNOSIS — I369 Nonrheumatic tricuspid valve disorder, unspecified: Secondary | ICD-10-CM | POA: Diagnosis not present

## 2013-11-12 DIAGNOSIS — E873 Alkalosis: Secondary | ICD-10-CM | POA: Diagnosis not present

## 2013-11-12 DIAGNOSIS — F411 Generalized anxiety disorder: Secondary | ICD-10-CM | POA: Diagnosis not present

## 2013-11-12 DIAGNOSIS — B9689 Other specified bacterial agents as the cause of diseases classified elsewhere: Secondary | ICD-10-CM | POA: Diagnosis not present

## 2013-11-12 DIAGNOSIS — I10 Essential (primary) hypertension: Secondary | ICD-10-CM | POA: Diagnosis not present

## 2013-11-12 DIAGNOSIS — N179 Acute kidney failure, unspecified: Secondary | ICD-10-CM | POA: Diagnosis not present

## 2013-11-12 DIAGNOSIS — D649 Anemia, unspecified: Secondary | ICD-10-CM | POA: Diagnosis not present

## 2013-11-12 DIAGNOSIS — N390 Urinary tract infection, site not specified: Secondary | ICD-10-CM | POA: Diagnosis not present

## 2013-11-12 DIAGNOSIS — M4800 Spinal stenosis, site unspecified: Secondary | ICD-10-CM | POA: Diagnosis not present

## 2013-11-12 DIAGNOSIS — D638 Anemia in other chronic diseases classified elsewhere: Secondary | ICD-10-CM | POA: Diagnosis present

## 2013-11-12 DIAGNOSIS — B961 Klebsiella pneumoniae [K. pneumoniae] as the cause of diseases classified elsewhere: Secondary | ICD-10-CM | POA: Diagnosis present

## 2013-11-12 DIAGNOSIS — R079 Chest pain, unspecified: Secondary | ICD-10-CM | POA: Diagnosis not present

## 2013-11-12 DIAGNOSIS — N170 Acute kidney failure with tubular necrosis: Secondary | ICD-10-CM | POA: Diagnosis not present

## 2013-11-12 DIAGNOSIS — G4733 Obstructive sleep apnea (adult) (pediatric): Secondary | ICD-10-CM | POA: Diagnosis present

## 2013-11-12 DIAGNOSIS — R5381 Other malaise: Secondary | ICD-10-CM | POA: Diagnosis present

## 2013-11-12 DIAGNOSIS — Z981 Arthrodesis status: Secondary | ICD-10-CM | POA: Diagnosis not present

## 2013-11-23 ENCOUNTER — Ambulatory Visit: Payer: Medicare Other | Admitting: Family Medicine

## 2013-11-24 DIAGNOSIS — E119 Type 2 diabetes mellitus without complications: Secondary | ICD-10-CM | POA: Diagnosis not present

## 2013-11-24 DIAGNOSIS — Z4789 Encounter for other orthopedic aftercare: Secondary | ICD-10-CM | POA: Diagnosis not present

## 2013-11-24 DIAGNOSIS — I10 Essential (primary) hypertension: Secondary | ICD-10-CM | POA: Diagnosis not present

## 2013-11-24 DIAGNOSIS — F329 Major depressive disorder, single episode, unspecified: Secondary | ICD-10-CM | POA: Diagnosis not present

## 2013-11-25 DIAGNOSIS — Z4789 Encounter for other orthopedic aftercare: Secondary | ICD-10-CM | POA: Diagnosis not present

## 2013-11-25 DIAGNOSIS — I10 Essential (primary) hypertension: Secondary | ICD-10-CM | POA: Diagnosis not present

## 2013-11-25 DIAGNOSIS — E119 Type 2 diabetes mellitus without complications: Secondary | ICD-10-CM | POA: Diagnosis not present

## 2013-11-25 DIAGNOSIS — F329 Major depressive disorder, single episode, unspecified: Secondary | ICD-10-CM | POA: Diagnosis not present

## 2013-11-26 DIAGNOSIS — F329 Major depressive disorder, single episode, unspecified: Secondary | ICD-10-CM | POA: Diagnosis not present

## 2013-11-26 DIAGNOSIS — Z4789 Encounter for other orthopedic aftercare: Secondary | ICD-10-CM | POA: Diagnosis not present

## 2013-11-26 DIAGNOSIS — E119 Type 2 diabetes mellitus without complications: Secondary | ICD-10-CM | POA: Diagnosis not present

## 2013-11-26 DIAGNOSIS — I10 Essential (primary) hypertension: Secondary | ICD-10-CM | POA: Diagnosis not present

## 2013-11-27 DIAGNOSIS — M48062 Spinal stenosis, lumbar region with neurogenic claudication: Secondary | ICD-10-CM | POA: Diagnosis not present

## 2013-11-27 DIAGNOSIS — M47817 Spondylosis without myelopathy or radiculopathy, lumbosacral region: Secondary | ICD-10-CM | POA: Diagnosis not present

## 2013-11-27 DIAGNOSIS — M4716 Other spondylosis with myelopathy, lumbar region: Secondary | ICD-10-CM | POA: Diagnosis not present

## 2013-11-29 ENCOUNTER — Encounter: Payer: Self-pay | Admitting: Family Medicine

## 2013-11-29 ENCOUNTER — Ambulatory Visit (INDEPENDENT_AMBULATORY_CARE_PROVIDER_SITE_OTHER): Payer: Medicare Other | Admitting: Family Medicine

## 2013-11-29 VITALS — BP 150/70 | HR 100 | Temp 97.3°F | Ht 64.5 in | Wt 247.0 lb

## 2013-11-29 DIAGNOSIS — K2100 Gastro-esophageal reflux disease with esophagitis, without bleeding: Secondary | ICD-10-CM | POA: Diagnosis not present

## 2013-11-29 DIAGNOSIS — J45901 Unspecified asthma with (acute) exacerbation: Secondary | ICD-10-CM

## 2013-11-29 DIAGNOSIS — J4521 Mild intermittent asthma with (acute) exacerbation: Secondary | ICD-10-CM

## 2013-11-29 DIAGNOSIS — N179 Acute kidney failure, unspecified: Secondary | ICD-10-CM

## 2013-11-29 DIAGNOSIS — F411 Generalized anxiety disorder: Secondary | ICD-10-CM

## 2013-11-29 DIAGNOSIS — I10 Essential (primary) hypertension: Secondary | ICD-10-CM | POA: Diagnosis not present

## 2013-11-29 DIAGNOSIS — E1159 Type 2 diabetes mellitus with other circulatory complications: Secondary | ICD-10-CM

## 2013-11-29 DIAGNOSIS — I509 Heart failure, unspecified: Secondary | ICD-10-CM

## 2013-11-29 LAB — GLUCOSE, POCT (MANUAL RESULT ENTRY): POC Glucose: 143 mg/dl — AB (ref 70–99)

## 2013-11-29 MED ORDER — AMLODIPINE BESYLATE 10 MG PO TABS: 10.0000 mg | ORAL_TABLET | Freq: Every day | ORAL | Status: DC

## 2013-11-29 MED ORDER — CLONAZEPAM 0.5 MG PO TABS: 0.5000 mg | ORAL_TABLET | Freq: Three times a day (TID) | ORAL | Status: DC | PRN

## 2013-11-29 MED ORDER — FLUTICASONE-SALMETEROL 250-50 MCG/DOSE IN AEPB: 1.0000 | INHALATION_SPRAY | Freq: Two times a day (BID) | RESPIRATORY_TRACT | Status: DC

## 2013-11-29 MED ORDER — HYDRALAZINE HCL 50 MG PO TABS: 50.0000 mg | ORAL_TABLET | Freq: Three times a day (TID) | ORAL | Status: DC

## 2013-11-29 MED ORDER — PANTOPRAZOLE SODIUM 40 MG PO TBEC: 40.0000 mg | DELAYED_RELEASE_TABLET | Freq: Every day | ORAL | Status: DC

## 2013-11-29 NOTE — Progress Notes (Signed)
Pre visit review using our clinic review tool, if applicable. No additional management support is needed unless otherwise documented below in the visit note. 

## 2013-11-29 NOTE — Progress Notes (Signed)
   Subjective:    Patient ID: Erin [name], female    DOB: [birth year], 68 y.o.   MRN: [id number]  HPI Pt here to f/u from hospital.  She was admitted for back surgery and ended up with ARF and CHF.  She was in the hospital for a month.  All her diabetic meds were stopped.  Pt has f/u appointments with cardiology, endo, ortho.      Review of Systems As above      Objective:   Physical Exam BP 150/70  Pulse 100  Temp(Src) 97.3 F (36.3 C) (Oral)  Ht 5' 4.5" (1.638 m)  Wt 247 lb (112.038 kg)  BMI 41.76 kg/m2  SpO2 97% General appearance: alert, cooperative, appears stated age and no distress Neck: no adenopathy, no carotid bruit, no JVD, supple, symmetrical, trachea midline and thyroid not enlarged, symmetric, no tenderness/mass/nodules Lungs: clear to auscultation bilaterally Heart: S1, S2 normal Extremities: edema tr pitting edema b/l low ext Neuro--- pt walking with walker        Assessment & Plan:  1. Essential hypertension stable - amLODipine (NORVASC) 10 MG tablet; Take 1 tablet (10 mg total) by mouth daily.  Dispense: 90 tablet; Refill: 3 - hydrALAZINE (APRESOLINE) 50 MG tablet; Take 1 tablet (50 mg total) by mouth 3 (three) times daily.  Dispense: 90 tablet  2. Anxiety state, unspecified Only using klonopin ocassionally - clonazePAM (KLONOPIN) 0.5 MG tablet; Take 1 tablet (0.5 mg total) by mouth 3 (three) times daily as needed for anxiety.  Dispense: 90 tablet; Refill: 1  3. Asthma with acute exacerbation, mild intermittent   - Fluticasone-Salmeterol (ADVAIR DISKUS) 250-50 MCG/DOSE AEPB; Inhale 1 puff into the lungs 2 (two) times daily.  Dispense: 1 each; Refill: 3  4. Gastroesophageal reflux disease with esophagitis   - pantoprazole (PROTONIX) 40 MG tablet; Take 1 tablet (40 mg total) by mouth daily.  Dispense: 30 tablet; Refill: 3  5. Type II or unspecified type diabetes mellitus with peripheral circulatory disorders, uncontrolled(250.72)   - POCT  Glucose (CBG)

## 2013-11-29 NOTE — Patient Instructions (Signed)
Diabetes and Standards of Medical Care Diabetes is complicated. You may find that your diabetes team includes a dietitian, nurse, diabetes educator, eye doctor, and more. To help everyone know what is going on and to help you get the care you deserve, the following schedule of care was developed to help keep you on track. Below are the tests, exams, vaccines, medicines, education, and plans you will need. HbA1c test This test shows how well you have controlled your glucose over the past 2-3 months. It is used to see if your diabetes management plan needs to be adjusted.   It is performed at least 2 times a year if you are meeting treatment goals.  It is performed 4 times a year if therapy has changed or if you are not meeting treatment goals. Blood pressure test  This test is performed at every routine medical visit. The goal is less than 140/90 mm Hg for most people, but 130/80 mm Hg in some cases. Ask your health care provider about your goal. Dental exam  Follow up with the dentist regularly. Eye exam  If you are diagnosed with type 1 diabetes as a child, get an exam upon reaching the age of 61 years or older and have had diabetes for 3-5 years. Yearly eye exams are recommended after that initial eye exam.  If you are diagnosed with type 1 diabetes as an adult, get an exam within 5 years of diagnosis and then yearly.  If you are diagnosed with type 2 diabetes, get an exam as soon as possible after the diagnosis and then yearly. Foot care exam  Visual foot exams are performed at every routine medical visit. The exams check for cuts, injuries, or other problems with the feet.  A comprehensive foot exam should be done yearly. This includes visual inspection as well as assessing foot pulses and testing for loss of sensation.  Check your feet nightly for cuts, injuries, or other problems with your feet. Tell your health care provider if anything is not healing. Kidney function test (urine  microalbumin)  This test is performed once a year.  Type 1 diabetes: The first test is performed 5 years after diagnosis.  Type 2 diabetes: The first test is performed at the time of diagnosis.  A serum creatinine and estimated glomerular filtration rate (eGFR) test is done once a year to assess the level of chronic kidney disease (CKD), if present. Lipid profile (cholesterol, HDL, LDL, triglycerides)  Performed every 5 years for most people.  The goal for LDL is less than 100 mg/dL. If you are at high risk, the goal is less than 70 mg/dL.  The goal for HDL is 40 mg/dL-50 mg/dL for men and 50 mg/dL-60 mg/dL for women. An HDL cholesterol of 60 mg/dL or higher gives some protection against heart disease.  The goal for triglycerides is less than 150 mg/dL. Influenza vaccine, pneumococcal vaccine, and hepatitis B vaccine  The influenza vaccine is recommended yearly.  It is recommended that people with diabetes who are over 81 years old get the pneumonia vaccine. In some cases, two separate shots may be given. Ask your health care provider if your pneumonia vaccination is up to date.  The hepatitis B vaccine is also recommended for adults with diabetes. Diabetes self-management education  Education is recommended at diagnosis and ongoing as needed. Treatment plan  Your treatment plan is reviewed at every medical visit. Document Released: 01/17/2009 Document Revised: 08/06/2013 Document Reviewed: 08/22/2012 [facility] Patient Information 2015 [employer]. This information is not intended to replace advice given to you by your health care provider. Make sure you discuss any questions you have with your health care provider.

## 2013-11-30 ENCOUNTER — Telehealth: Payer: Self-pay | Admitting: Internal Medicine

## 2013-11-30 DIAGNOSIS — E559 Vitamin D deficiency, unspecified: Secondary | ICD-10-CM | POA: Diagnosis not present

## 2013-11-30 DIAGNOSIS — I10 Essential (primary) hypertension: Secondary | ICD-10-CM | POA: Diagnosis not present

## 2013-11-30 DIAGNOSIS — Z4789 Encounter for other orthopedic aftercare: Secondary | ICD-10-CM | POA: Diagnosis not present

## 2013-11-30 DIAGNOSIS — N170 Acute kidney failure with tubular necrosis: Secondary | ICD-10-CM | POA: Diagnosis not present

## 2013-11-30 DIAGNOSIS — E119 Type 2 diabetes mellitus without complications: Secondary | ICD-10-CM | POA: Diagnosis not present

## 2013-11-30 DIAGNOSIS — M109 Gout, unspecified: Secondary | ICD-10-CM | POA: Diagnosis not present

## 2013-11-30 DIAGNOSIS — F329 Major depressive disorder, single episode, unspecified: Secondary | ICD-10-CM | POA: Diagnosis not present

## 2013-11-30 NOTE — Telephone Encounter (Signed)
Returned pt's call. Pt states that she had back surgery. During this, the physicians took her off her diabetes meds due to her kidney function. She is not taking any diabetic meds. She has not been testing her blood sugar regularly. Yesterday it was 155. Advised her that she needs to be checking it daily. She sad that she saw Dr Etter Sjogren this week and she suggested that she advise Dr Cruzita Lederer that she is not taking any of the diabetic meds. Please advise.

## 2013-11-30 NOTE — Telephone Encounter (Signed)
Erin [name], for how long she needs to be off? Please advise her to check 3x a day or so, rotating between before meals and 2h after meals and call us with the sugars in few days.

## 2013-11-30 NOTE — Telephone Encounter (Signed)
Patient would like to talk to you concerning her medications

## 2013-12-03 ENCOUNTER — Other Ambulatory Visit: Payer: Self-pay | Admitting: *Deleted

## 2013-12-03 ENCOUNTER — Telehealth: Payer: Self-pay | Admitting: *Deleted

## 2013-12-03 DIAGNOSIS — Z4789 Encounter for other orthopedic aftercare: Secondary | ICD-10-CM | POA: Diagnosis not present

## 2013-12-03 DIAGNOSIS — E119 Type 2 diabetes mellitus without complications: Secondary | ICD-10-CM | POA: Diagnosis not present

## 2013-12-03 DIAGNOSIS — I10 Essential (primary) hypertension: Secondary | ICD-10-CM | POA: Diagnosis not present

## 2013-12-03 DIAGNOSIS — F329 Major depressive disorder, single episode, unspecified: Secondary | ICD-10-CM | POA: Diagnosis not present

## 2013-12-03 MED ORDER — GLIPIZIDE 5 MG PO TABS: ORAL_TABLET | ORAL | Status: DC

## 2013-12-03 NOTE — Telephone Encounter (Signed)
Pt returned call. Advised pt per Dr Arman Filter note. Pt stated she called her specialist and they said it was ok to start diabetes meds again. Her last kidney function test was 1.7. Pt stated her bg was 260 at 1:00 pm. Please advise.

## 2013-12-03 NOTE — Telephone Encounter (Signed)
Opened encounter in error  

## 2013-12-03 NOTE — Telephone Encounter (Signed)
Let's start glipizide 5 mg bid for now. Check back with [place] in few days or sooner if she has low CBGs.

## 2013-12-03 NOTE — Telephone Encounter (Signed)
Left message with daughter to return call when she awakens.

## 2013-12-03 NOTE — Telephone Encounter (Signed)
Called pt and advised her per Dr Arman Filter note. Pt understood. Pt asked for [place] to send an Rx for glipizide to [facility] in [place]. Done.

## 2013-12-05 DIAGNOSIS — E119 Type 2 diabetes mellitus without complications: Secondary | ICD-10-CM | POA: Diagnosis not present

## 2013-12-05 DIAGNOSIS — Z4789 Encounter for other orthopedic aftercare: Secondary | ICD-10-CM | POA: Diagnosis not present

## 2013-12-05 DIAGNOSIS — F329 Major depressive disorder, single episode, unspecified: Secondary | ICD-10-CM | POA: Diagnosis not present

## 2013-12-05 DIAGNOSIS — I10 Essential (primary) hypertension: Secondary | ICD-10-CM | POA: Diagnosis not present

## 2013-12-06 DIAGNOSIS — E119 Type 2 diabetes mellitus without complications: Secondary | ICD-10-CM | POA: Diagnosis not present

## 2013-12-06 DIAGNOSIS — Z4789 Encounter for other orthopedic aftercare: Secondary | ICD-10-CM | POA: Diagnosis not present

## 2013-12-06 DIAGNOSIS — I10 Essential (primary) hypertension: Secondary | ICD-10-CM | POA: Diagnosis not present

## 2013-12-06 DIAGNOSIS — F329 Major depressive disorder, single episode, unspecified: Secondary | ICD-10-CM | POA: Diagnosis not present

## 2013-12-07 ENCOUNTER — Telehealth: Payer: Self-pay | Admitting: Internal Medicine

## 2013-12-07 DIAGNOSIS — I10 Essential (primary) hypertension: Secondary | ICD-10-CM | POA: Diagnosis not present

## 2013-12-07 DIAGNOSIS — F329 Major depressive disorder, single episode, unspecified: Secondary | ICD-10-CM | POA: Diagnosis not present

## 2013-12-07 DIAGNOSIS — R5381 Other malaise: Secondary | ICD-10-CM | POA: Diagnosis not present

## 2013-12-07 DIAGNOSIS — Z4789 Encounter for other orthopedic aftercare: Secondary | ICD-10-CM | POA: Diagnosis not present

## 2013-12-07 DIAGNOSIS — E119 Type 2 diabetes mellitus without complications: Secondary | ICD-10-CM | POA: Diagnosis not present

## 2013-12-07 NOTE — Telephone Encounter (Signed)
Doe s the patient have and appointment with kidney doctor --- f/u from hospital ---

## 2013-12-07 NOTE — Telephone Encounter (Signed)
Caller name: Elyse Jarvis  Relation to pt: Home Health  Call back number: [phone]  Reason for call:   Renato Gails PA from [place] wanted to advise you pt B/P 160/80

## 2013-12-07 NOTE — Telephone Encounter (Signed)
FYI

## 2013-12-07 NOTE — Telephone Encounter (Signed)
Spoke with the patient and she has an apt scheduled on the 28th of this month with the kidney specialist.      KP

## 2013-12-07 NOTE — Telephone Encounter (Signed)
Was sent to me by mistake.

## 2013-12-10 DIAGNOSIS — E119 Type 2 diabetes mellitus without complications: Secondary | ICD-10-CM | POA: Diagnosis not present

## 2013-12-10 DIAGNOSIS — Z4789 Encounter for other orthopedic aftercare: Secondary | ICD-10-CM | POA: Diagnosis not present

## 2013-12-10 DIAGNOSIS — F329 Major depressive disorder, single episode, unspecified: Secondary | ICD-10-CM | POA: Diagnosis not present

## 2013-12-10 DIAGNOSIS — I10 Essential (primary) hypertension: Secondary | ICD-10-CM | POA: Diagnosis not present

## 2013-12-11 DIAGNOSIS — J310 Chronic rhinitis: Secondary | ICD-10-CM | POA: Diagnosis not present

## 2013-12-11 DIAGNOSIS — F411 Generalized anxiety disorder: Secondary | ICD-10-CM | POA: Diagnosis not present

## 2013-12-11 DIAGNOSIS — R0609 Other forms of dyspnea: Secondary | ICD-10-CM | POA: Diagnosis not present

## 2013-12-11 DIAGNOSIS — R0989 Other specified symptoms and signs involving the circulatory and respiratory systems: Secondary | ICD-10-CM | POA: Diagnosis not present

## 2013-12-11 DIAGNOSIS — G4730 Sleep apnea, unspecified: Secondary | ICD-10-CM | POA: Diagnosis not present

## 2013-12-11 DIAGNOSIS — G4710 Hypersomnia, unspecified: Secondary | ICD-10-CM | POA: Diagnosis not present

## 2013-12-12 DIAGNOSIS — F329 Major depressive disorder, single episode, unspecified: Secondary | ICD-10-CM | POA: Diagnosis not present

## 2013-12-12 DIAGNOSIS — Z4789 Encounter for other orthopedic aftercare: Secondary | ICD-10-CM | POA: Diagnosis not present

## 2013-12-12 DIAGNOSIS — E119 Type 2 diabetes mellitus without complications: Secondary | ICD-10-CM | POA: Diagnosis not present

## 2013-12-12 DIAGNOSIS — I10 Essential (primary) hypertension: Secondary | ICD-10-CM | POA: Diagnosis not present

## 2013-12-13 ENCOUNTER — Telehealth: Payer: Self-pay | Admitting: *Deleted

## 2013-12-13 DIAGNOSIS — Z4789 Encounter for other orthopedic aftercare: Secondary | ICD-10-CM | POA: Diagnosis not present

## 2013-12-13 DIAGNOSIS — I10 Essential (primary) hypertension: Secondary | ICD-10-CM | POA: Diagnosis not present

## 2013-12-13 DIAGNOSIS — E119 Type 2 diabetes mellitus without complications: Secondary | ICD-10-CM | POA: Diagnosis not present

## 2013-12-13 DIAGNOSIS — F329 Major depressive disorder, single episode, unspecified: Secondary | ICD-10-CM | POA: Diagnosis not present

## 2013-12-13 NOTE — Telephone Encounter (Signed)
Ok, this sounds like a good plan. Please let me know, also.

## 2013-12-13 NOTE — Telephone Encounter (Signed)
Pt called stating that her sugar levels have increased. AM 168-170; lunch around 200 and after dinner 170-200, depending on what she eats. Please advise.

## 2013-12-13 NOTE — Telephone Encounter (Signed)
Let's go ahead and start Metformin back - 500 mg 2x a day. I believe she has appt with nephrology on 09/28 >> please advise her to make sure they recheck her kidney fxn then, but I imagine this is in plan.

## 2013-12-13 NOTE — Telephone Encounter (Signed)
Called pt and advised her per Dr Arman Filter note. Pt stated she was hesitant to restart the Metformin b/c her kidney dr took her off of it, stated it was bad on her kidneys. Last kidney fxn test it was a 1.7 and they want it at a 1. She stated she is going to contact her kidney dr first seek their advice before restarting the Metformin. She said she would call me back and let us know what her kidney dr said. Be advised.

## 2013-12-17 ENCOUNTER — Telehealth: Payer: Self-pay | Admitting: Internal Medicine

## 2013-12-17 ENCOUNTER — Telehealth: Payer: Self-pay | Admitting: Family Medicine

## 2013-12-17 DIAGNOSIS — K2100 Gastro-esophageal reflux disease with esophagitis, without bleeding: Secondary | ICD-10-CM

## 2013-12-17 DIAGNOSIS — I10 Essential (primary) hypertension: Secondary | ICD-10-CM

## 2013-12-17 DIAGNOSIS — E785 Hyperlipidemia, unspecified: Secondary | ICD-10-CM

## 2013-12-17 DIAGNOSIS — J4521 Mild intermittent asthma with (acute) exacerbation: Secondary | ICD-10-CM

## 2013-12-17 MED ORDER — FLUTICASONE-SALMETEROL 250-50 MCG/DOSE IN AEPB: 1.0000 | INHALATION_SPRAY | Freq: Two times a day (BID) | RESPIRATORY_TRACT | Status: DC

## 2013-12-17 MED ORDER — LINAGLIPTIN 5 MG PO TABS: 5.0000 mg | ORAL_TABLET | Freq: Every day | ORAL | Status: DC

## 2013-12-17 MED ORDER — FENOFIBRATE 160 MG PO TABS: 160.0000 mg | ORAL_TABLET | Freq: Every day | ORAL | Status: DC

## 2013-12-17 MED ORDER — ATORVASTATIN CALCIUM 40 MG PO TABS: 40.0000 mg | ORAL_TABLET | Freq: Every day | ORAL | Status: DC

## 2013-12-17 MED ORDER — FUROSEMIDE 20 MG PO TABS: ORAL_TABLET | ORAL | Status: DC

## 2013-12-17 MED ORDER — PANTOPRAZOLE SODIUM 40 MG PO TBEC: 40.0000 mg | DELAYED_RELEASE_TABLET | Freq: Every day | ORAL | Status: DC

## 2013-12-17 MED ORDER — HYDRALAZINE HCL 50 MG PO TABS: 50.0000 mg | ORAL_TABLET | Freq: Three times a day (TID) | ORAL | Status: DC

## 2013-12-17 MED ORDER — AMLODIPINE BESYLATE 10 MG PO TABS: 10.0000 mg | ORAL_TABLET | Freq: Every day | ORAL | Status: DC

## 2013-12-17 NOTE — Telephone Encounter (Signed)
Let's try to send Tradjenta - 5 mg daily in am 30 days #1 refill

## 2013-12-17 NOTE — Telephone Encounter (Signed)
The medications were put in the system by Dr.Lowne however they were not faxed, the option was sent to no print on 11/29/13. All med's have been send as entered in the system per MD.     KP

## 2013-12-17 NOTE — Telephone Encounter (Signed)
Please read note below and advise.  

## 2013-12-17 NOTE — Telephone Encounter (Signed)
Patient stated that kidney doctor has faxed a form over stating why patient can't take Metformin, Patient would like to know what else can she take.

## 2013-12-17 NOTE — Telephone Encounter (Signed)
Called pt and advised her. Pt understood and will let us know if her ins will not cover this med.

## 2013-12-17 NOTE — Telephone Encounter (Signed)
Caller name: Panhia Relation to pt: self Call back number: [phone] Pharmacy: [facility] in [place]  Reason for call:   Patient is requesting a refill of amioditane, atorvastatin, fenofibrate, fluticasone, furosemide, hydralazine, Pantoprazole. She states that all prescriptions were prescribed by MD at the hospital

## 2013-12-18 DIAGNOSIS — M999 Biomechanical lesion, unspecified: Secondary | ICD-10-CM | POA: Diagnosis not present

## 2013-12-21 ENCOUNTER — Ambulatory Visit: Payer: Medicare Other | Admitting: Internal Medicine

## 2013-12-25 DIAGNOSIS — G4710 Hypersomnia, unspecified: Secondary | ICD-10-CM | POA: Diagnosis not present

## 2013-12-25 DIAGNOSIS — J310 Chronic rhinitis: Secondary | ICD-10-CM | POA: Diagnosis not present

## 2013-12-25 DIAGNOSIS — R0609 Other forms of dyspnea: Secondary | ICD-10-CM | POA: Diagnosis not present

## 2013-12-25 DIAGNOSIS — F411 Generalized anxiety disorder: Secondary | ICD-10-CM | POA: Diagnosis not present

## 2013-12-25 DIAGNOSIS — G4730 Sleep apnea, unspecified: Secondary | ICD-10-CM | POA: Diagnosis not present

## 2013-12-25 DIAGNOSIS — R0989 Other specified symptoms and signs involving the circulatory and respiratory systems: Secondary | ICD-10-CM | POA: Diagnosis not present

## 2013-12-26 DIAGNOSIS — Z4789 Encounter for other orthopedic aftercare: Secondary | ICD-10-CM | POA: Diagnosis not present

## 2013-12-26 DIAGNOSIS — I10 Essential (primary) hypertension: Secondary | ICD-10-CM | POA: Diagnosis not present

## 2013-12-26 DIAGNOSIS — E119 Type 2 diabetes mellitus without complications: Secondary | ICD-10-CM | POA: Diagnosis not present

## 2013-12-26 DIAGNOSIS — F329 Major depressive disorder, single episode, unspecified: Secondary | ICD-10-CM | POA: Diagnosis not present

## 2014-01-01 ENCOUNTER — Other Ambulatory Visit: Payer: Self-pay

## 2014-01-01 DIAGNOSIS — K2100 Gastro-esophageal reflux disease with esophagitis, without bleeding: Secondary | ICD-10-CM

## 2014-01-01 MED ORDER — PANTOPRAZOLE SODIUM 40 MG PO TBEC: 40.0000 mg | DELAYED_RELEASE_TABLET | Freq: Every day | ORAL | Status: DC

## 2014-01-02 ENCOUNTER — Other Ambulatory Visit: Payer: Self-pay

## 2014-01-02 DIAGNOSIS — K2100 Gastro-esophageal reflux disease with esophagitis, without bleeding: Secondary | ICD-10-CM

## 2014-01-02 MED ORDER — PANTOPRAZOLE SODIUM 40 MG PO TBEC: 40.0000 mg | DELAYED_RELEASE_TABLET | Freq: Every day | ORAL | Status: DC

## 2014-01-11 ENCOUNTER — Encounter: Payer: Self-pay | Admitting: Internal Medicine

## 2014-01-11 ENCOUNTER — Ambulatory Visit (INDEPENDENT_AMBULATORY_CARE_PROVIDER_SITE_OTHER): Payer: Medicare Other | Admitting: Internal Medicine

## 2014-01-11 VITALS — BP 146/60 | HR 94 | Temp 97.6°F | Resp 16 | Wt 249.0 lb

## 2014-01-11 DIAGNOSIS — E119 Type 2 diabetes mellitus without complications: Secondary | ICD-10-CM

## 2014-01-11 LAB — HEMOGLOBIN A1C: Hgb A1c MFr Bld: 5.8 % (ref 4.6–6.5)

## 2014-01-11 MED ORDER — GLIPIZIDE 5 MG PO TABS: ORAL_TABLET | ORAL | Status: DC

## 2014-01-11 NOTE — Progress Notes (Signed)
Patient ID: Erin [name], female   DOB: 1946/03/04, 68 y.o.   MRN: [id number]  HPI: Erin [name] is a 68 y.o.-year-old female, returning for f/u for DM2, dx 2013, non-insulin-dependent, uncontrolled, without complications. Last visit 4 mo ago.  She had her vertebral fusion in 39/7673 >> had complications >> AKI, CHF exacerbation.  Last hemoglobin A1c was: Lab Results  Component Value Date   HGBA1C 6.8* 08/02/2013   HGBA1C 7.1* 07/05/2013   HGBA1C 7.2* 01/17/2013   Pt is on a regimen of: - Glipizide 5 mg in am and 5 in pm - Tradjenta 5 mg in the evening Had to stop Metformin 2/2 AKI after her back sx.  Pt checks her sugars 1-2 a day (no log/meter): - am: 110-160 >> 110-115 (highest 150) >> 117-148 >> 100-140 >> 120-140 - 2h after b'fast: 79-140 >> <140 >> n/c - before lunch: 124-200 >> 100-115 >> n/c - 2h after lunch: sometimes checking >> n/c >> 137-200 >> 150-170s >> 140s-150s - before dinner: 120-200 >> 130s >> 148, 189 >> n/c - 2h after dinner: up to 270 >> 130-140 >> 165, 200 >> n/c >> 1h after dinner: 180-200 - bedtime: 95-126 >> n/c  No lows. Lowest sugar was 90s; she has hypoglycemia awareness at 70.  Highest sugar was 200s.  - + CKD, last BUN/creatinine:  11/30/2013 [facility] - Cr 1.8  Lab Results  Component Value Date   BUN 14 08/02/2013   CREATININE 0.9 08/02/2013  On Lisinopril. - last set of lipids: Lab Results  Component Value Date   CHOL 178 08/02/2013   HDL 38.40* 08/02/2013   LDLCALC 86 08/02/2013   LDLDIRECT 79.2 01/17/2013   TRIG 266.0* 08/02/2013   CHOLHDL 5 08/02/2013  On Lipitor. - last eye exam was in 12/2012. No DR. Cataract >> had R eye surgery, had the cataract sx. Has one on 01/25/2014. - + numbness and tingling in her  big toe, not feet. She has swelling in feet 2/2 heat.   She also has a history of HL, asthma, depression, vit D def.   I reviewed pt's medications, allergies, PMH, social hx, family hx and no changes required,  except as mentioned above.She had medication changes while in house >> unclear which meds were changed (other than metformin being stopped) >> she will let me know.  ROS: Constitutional: no weight gain, + fatigue, no subjective hyperthermia/hypothermia, + poor sleep, + excessive urination Eyes: + blurry vision, no xerophthalmia ENT: no sore throat, no nodules palpated in throat, no dysphagia/odynophagia, no hoarseness Cardiovascular: no CP/SOB/no palpitations/+ leg swelling Respiratory: no cough/SOB Gastrointestinal: no N /no V/no D/no C  Musculoskeletal: + muscle aches /no joint aches Skin: no rashes Neurological: no tremors/numbness/tingling/dizziness  PE: BP 146/60  Pulse 94  Temp(Src) 97.6 F (36.4 C) (Oral)  Resp 16  Wt 249 lb (112.946 kg)  SpO2 95% Wt Readings from Last 3 Encounters:  01/11/14 249 lb (112.946 kg)  11/29/13 247 lb (112.038 kg)  10/25/13 270 lb 4 oz (122.585 kg)   Constitutional: obese, in NAD Eyes: PERRLA, EOMI, no exophthalmos ENT: moist mucous membranes, no thyromegaly, no cervical lymphadenopathy Cardiovascular: RRR, No MRG, + marked pitting edema bilat. Respiratory: CTA B Gastrointestinal: abdomen soft, NT, ND, BS+ Musculoskeletal: no deformities, strength intact in all 4 Skin: moist, warm, no rashes  ASSESSMENT: 1. DM2, non-insulin-dependent, now controlled, without complications  PLAN:  1. Patient with long-standing, recently more controlled diabetes. We had to stop Metformin and start [place] 2/2  AKI after her Sx, but she takes it at bedtime >> will move it to am. - I suggested to:  Patient Instructions  Please continue Glipizide 5 mg 2x a day before meals. Move Tradjenta 5 mg to am (before breakfast).  Please stop at the lab.  Please return in 3 month with your sugar log.   - she improved her diet - continue checking sugars at different times of the day - check 2 times a day, rotating checks - Bring log at next visit - she needs new  eye exam - has one scheduled - check A1c today - Return to clinic in 3 mo with sugar log   Office Visit on 01/11/2014  Component Date Value Ref Range Status  . Hemoglobin A1C 01/11/2014 5.8  4.6 - 6.5 % Final   Glycemic Control Guidelines for People with Diabetes:Non Diabetic:  <6%Goal of Therapy: <7%Additional Action Suggested:  >8%    Excellent HbA1c!

## 2014-01-11 NOTE — Patient Instructions (Signed)
Please continue Glipizide 5 mg 2x a day before meals. Move Tradjenta 5 mg to am (before breakfast).  Please stop at the lab.  Please return in 3 month with your sugar log.

## 2014-01-23 DIAGNOSIS — M4716 Other spondylosis with myelopathy, lumbar region: Secondary | ICD-10-CM | POA: Diagnosis not present

## 2014-01-23 DIAGNOSIS — Q762 Congenital spondylolisthesis: Secondary | ICD-10-CM | POA: Diagnosis not present

## 2014-01-23 DIAGNOSIS — M545 Low back pain: Secondary | ICD-10-CM | POA: Diagnosis not present

## 2014-01-23 DIAGNOSIS — M4180 Other forms of scoliosis, site unspecified: Secondary | ICD-10-CM | POA: Diagnosis not present

## 2014-01-31 DIAGNOSIS — E119 Type 2 diabetes mellitus without complications: Secondary | ICD-10-CM | POA: Diagnosis not present

## 2014-02-05 ENCOUNTER — Ambulatory Visit (INDEPENDENT_AMBULATORY_CARE_PROVIDER_SITE_OTHER): Payer: Medicare Other | Admitting: Family Medicine

## 2014-02-05 ENCOUNTER — Encounter: Payer: Self-pay | Admitting: Family Medicine

## 2014-02-05 VITALS — BP 150/60 | HR 97 | Temp 97.6°F | Wt 260.0 lb

## 2014-02-05 DIAGNOSIS — E785 Hyperlipidemia, unspecified: Secondary | ICD-10-CM

## 2014-02-05 DIAGNOSIS — I10 Essential (primary) hypertension: Secondary | ICD-10-CM

## 2014-02-05 DIAGNOSIS — Z23 Encounter for immunization: Secondary | ICD-10-CM | POA: Diagnosis not present

## 2014-02-05 LAB — HEPATIC FUNCTION PANEL
ALT: 23 U/L (ref 0–35)
AST: 25 U/L (ref 0–37)
Albumin: 3.4 g/dL — ABNORMAL LOW (ref 3.5–5.2)
Alkaline Phosphatase: 59 U/L (ref 39–117)
Bilirubin, Direct: 0.1 mg/dL (ref 0.0–0.3)
Total Bilirubin: 0.6 mg/dL (ref 0.2–1.2)
Total Protein: 7.5 g/dL (ref 6.0–8.3)

## 2014-02-05 LAB — POCT URINALYSIS DIPSTICK
Bilirubin, UA: NEGATIVE
Blood, UA: NEGATIVE
Glucose, UA: NEGATIVE
Ketones, UA: NEGATIVE
Leukocytes, UA: NEGATIVE
Nitrite, UA: NEGATIVE
Protein, UA: NEGATIVE
Spec Grav, UA: 1.025
Urobilinogen, UA: 0.2
pH, UA: 6

## 2014-02-05 LAB — BASIC METABOLIC PANEL
BUN: 27 mg/dL — ABNORMAL HIGH (ref 6–23)
CO2: 23 mEq/L (ref 19–32)
Calcium: 9.1 mg/dL (ref 8.4–10.5)
Chloride: 106 mEq/L (ref 96–112)
Creatinine, Ser: 1.2 mg/dL (ref 0.4–1.2)
GFR: 47.83 mL/min — ABNORMAL LOW (ref >60.00)
Glucose, Bld: 105 mg/dL — ABNORMAL HIGH (ref 70–99)
Potassium: 3.9 mEq/L (ref 3.5–5.1)
Sodium: 138 mEq/L (ref 135–145)

## 2014-02-05 LAB — LIPID PANEL
Cholesterol: 136 mg/dL (ref 0–200)
HDL: 47.3 mg/dL (ref >39.00)
LDL Cholesterol: 67 mg/dL (ref 0–99)
NonHDL: 88.7
Total CHOL/HDL Ratio: 3
Triglycerides: 111 mg/dL (ref 0.0–149.0)
VLDL: 22.2 mg/dL (ref 0.0–40.0)

## 2014-02-05 LAB — MICROALBUMIN / CREATININE URINE RATIO
Creatinine,U: 67 mg/dL
Microalb Creat Ratio: 2.2 mg/g (ref 0.0–30.0)
Microalb, Ur: 1.5 mg/dL (ref 0.0–1.9)

## 2014-02-05 NOTE — Progress Notes (Signed)
Pre visit review using our clinic review tool, if applicable. No additional management support is needed unless otherwise documented below in the visit note. 

## 2014-02-05 NOTE — Patient Instructions (Signed)

## 2014-02-05 NOTE — Progress Notes (Signed)
  Subjective:    Patient here for follow-up of elevated blood pressure.  She is not exercising and is not adherent to a low-salt diet.  Blood pressure is well controlled at home. Cardiac symptoms: lower extremity edema. Patient denies: chest pain, chest pressure/discomfort, claudication, dyspnea, exertional chest pressure/discomfort, fatigue, irregular heart beat, near-syncope, orthopnea, palpitations, paroxysmal nocturnal dyspnea, syncope and tachypnea. Cardiovascular risk factors: advanced age (older than 49 for men, 41 for women), diabetes mellitus, dyslipidemia, hypertension, obesity (BMI >= 30 kg/m2) and sedentary lifestyle. Use of agents associated with hypertension: none. History of target organ damage: none. We are also checking the cholesterol.  Pt seeing endo for DM.    The following portions of the patient's history were reviewed and updated as appropriate: She  has a past medical history of Depression; Hyperlipidemia; Chronic kidney disease; and CHF (congestive heart failure)..  Review of Systems Pertinent items are noted in HPI.     Objective:    BP 150/60 mmHg  Pulse 97  Temp(Src) 97.6 F (36.4 C) (Oral)  Wt 260 lb (117.935 kg)  SpO2 95% General appearance: alert, cooperative, appears stated age and no distress Neck: no adenopathy, supple, symmetrical, trachea midline and thyroid not enlarged, symmetric, no tenderness/mass/nodules Lungs: clear to auscultation bilaterally Heart: S1, S2 normal Extremities: edema +1 pitting edema b/l low ext    Assessment:    Hypertension, slightly high today. Evidence of target organ damage: none.    Plan:    Medication: no change. Dietary sodium restriction. Regular aerobic exercise. Follow up: 6 months and as needed.    1. Need for prophylactic vaccination and inoculation against influenza  - Flu vaccine HIGH DOSE PF (Fluzone Tri High dose)  2. Need for pneumococcal vaccination  - Pneumococcal conjugate vaccine 13-valent  3.  Essential hypertension stable - POCT urinalysis dipstick - Microalbumin / creatinine urine ratio - Basic metabolic panel  4. Hyperlipidemia Stable -- check labs Hepatic function panel - Lipid panel - POCT urinalysis dipstick - Microalbumin / creatinine urine ratio 5. DM--per endo- well controlled

## 2014-02-06 DIAGNOSIS — S39012D Strain of muscle, fascia and tendon of lower back, subsequent encounter: Secondary | ICD-10-CM | POA: Diagnosis not present

## 2014-02-11 DIAGNOSIS — S39012D Strain of muscle, fascia and tendon of lower back, subsequent encounter: Secondary | ICD-10-CM | POA: Diagnosis not present

## 2014-02-13 DIAGNOSIS — S39012D Strain of muscle, fascia and tendon of lower back, subsequent encounter: Secondary | ICD-10-CM | POA: Diagnosis not present

## 2014-02-15 DIAGNOSIS — S39012D Strain of muscle, fascia and tendon of lower back, subsequent encounter: Secondary | ICD-10-CM | POA: Diagnosis not present

## 2014-02-18 ENCOUNTER — Ambulatory Visit (INDEPENDENT_AMBULATORY_CARE_PROVIDER_SITE_OTHER): Payer: Medicare Other | Admitting: Physician Assistant

## 2014-02-18 ENCOUNTER — Encounter: Payer: Self-pay | Admitting: Physician Assistant

## 2014-02-18 VITALS — BP 160/70 | HR 90 | Temp 97.6°F | Resp 18 | Ht 64.0 in | Wt 259.0 lb

## 2014-02-18 DIAGNOSIS — B9789 Other viral agents as the cause of diseases classified elsewhere: Principal | ICD-10-CM

## 2014-02-18 DIAGNOSIS — J069 Acute upper respiratory infection, unspecified: Secondary | ICD-10-CM | POA: Diagnosis not present

## 2014-02-18 MED ORDER — HYDROCOD POLST-CHLORPHEN POLST 10-8 MG/5ML PO LQCR: 5.0000 mL | Freq: Two times a day (BID) | ORAL | Status: DC | PRN

## 2014-02-18 MED ORDER — BENZONATATE 100 MG PO CAPS: 100.0000 mg | ORAL_CAPSULE | Freq: Two times a day (BID) | ORAL | Status: DC | PRN

## 2014-02-18 NOTE — Assessment & Plan Note (Signed)
1-day of symptoms.  Afebrile.  Exam within normal limits.  Encouraged increase fluids and rest.  Will Rx cough medication (Tessalon) to help with cough.  Resume asthma medications to help prevent exacerbation.  Plain Mucinex. Place humidifier in bedroom.  Return precautions discussed with patient.

## 2014-02-18 NOTE — Patient Instructions (Signed)
Please take cough syrup as directed.  Mucinex to help break up congestion.  Increase fluids.  Get plenty of rest.  Use Saline nasal spray to keep sinuses clear.  Continue your asthma medications as directed.  Call or return to office if symptoms are not improving over the next few days.   Upper Respiratory Infection, Adult An upper respiratory infection (URI) is also known as the common cold. It is often caused by a type of germ (virus). Colds are easily spread (contagious). You can pass it to others by kissing, coughing, sneezing, or drinking out of the same glass. Usually, you get better in 1 or 2 weeks.  HOME CARE   Only take medicine as told by your doctor.  Use a warm mist humidifier or breathe in steam from a hot shower.  Drink enough water and fluids to keep your pee (urine) clear or pale yellow.  Get plenty of rest.  Return to work when your temperature is back to normal or as told by your doctor. You may use a face mask and wash your hands to stop your cold from spreading. GET HELP RIGHT AWAY IF:   After the first few days, you feel you are getting worse.  You have questions about your medicine.  You have chills, shortness of breath, or brown or red spit (mucus).  You have yellow or brown snot (nasal discharge) or pain in the face, especially when you bend forward.  You have a fever, puffy (swollen) neck, pain when you swallow, or white spots in the back of your throat.  You have a bad headache, ear pain, sinus pain, or chest pain.  You have a high-pitched whistling sound when you breathe in and out (wheezing).  You have a lasting cough or cough up blood.  You have sore muscles or a stiff neck. MAKE SURE YOU:   Understand these instructions.  Will watch your condition.  Will get help right away if you are not doing well or get worse. Document Released: 09/08/2007 Document Revised: 06/14/2011 Document Reviewed: 06/27/2013 [facility] Patient Information 2015  [place], [place]. This information is not intended to replace advice given to you by your health care provider. Make sure you discuss any questions you have with your health care provider.

## 2014-02-18 NOTE — Progress Notes (Signed)
Pre visit review using our clinic review tool, if applicable. No additional management support is needed unless otherwise documented below in the visit note/SLS  

## 2014-02-18 NOTE — Progress Notes (Signed)
Patient presents to clinic today c/o sinus pressure, nasal congestion headache and fatigue x 1 day.  Is unsure of fever.  Denies recent travel.  Endorses multiple sick contacts with similar symptoms.  Has had flu shot.  Is not taking Albuterol or Advair.  Denies wheezing or chest tightness.  Past Medical History  Diagnosis Date  . Depression   . Hyperlipidemia   . Chronic kidney disease   . CHF (congestive heart failure)     hospital 11/2013-- [place]    Current Outpatient Prescriptions on File Prior to Visit  Medication Sig Dispense Refill  . albuterol (PROVENTIL) (2.5 MG/3ML) 0.083% nebulizer solution Take 2.5 mg by nebulization every 6 (six) hours as needed for wheezing or shortness of breath.    Marland Kitchen amLODipine (NORVASC) 10 MG tablet Take 1 tablet (10 mg total) by mouth daily. 90 tablet 1  . atorvastatin (LIPITOR) 40 MG tablet Take 1 tablet (40 mg total) by mouth daily. 30 tablet 5  . fenofibrate 160 MG tablet Take 1 tablet (160 mg total) by mouth daily. 90 tablet 1  . Fluticasone-Salmeterol (ADVAIR DISKUS) 250-50 MCG/DOSE AEPB Inhale 1 puff into the lungs 2 (two) times daily. 1 each 3  . furosemide (LASIX) 20 MG tablet TAKE ONE TABLET BY MOUTH TWICE DAILY 60 tablet 5  . glipiZIDE (GLUCOTROL) 5 MG tablet Take 1 tablet twice daily as instructed. 60 tablet 2  . hydrALAZINE (APRESOLINE) 50 MG tablet Take 1 tablet (50 mg total) by mouth 3 (three) times daily. 90 tablet 5  . HYDROcodone-acetaminophen (NORCO) 10-325 MG per tablet Take 1 tablet by mouth every 8 (eight) hours as needed.    . linagliptin (TRADJENTA) 5 MG TABS tablet Take 1 tablet (5 mg total) by mouth daily. 30 tablet 1  . pantoprazole (PROTONIX) 40 MG tablet Take 1 tablet (40 mg total) by mouth daily. 30 tablet 11  . pramipexole (MIRAPEX) 1 MG tablet TAKE ONE TABLET BY MOUTH AT BEDTIME 30 tablet 5  . sertraline (ZOLOFT) 50 MG tablet Take 1 tablet (50 mg total) by mouth daily. 90 tablet 3   No current facility-administered  medications on file prior to visit.    No Known Allergies  Family History  Problem Relation Age of Onset  . Hypertension Mother   . Alzheimer's disease Mother   . Hyperlipidemia Mother   . Heart disease Mother     cad  . Asthma Father   . Cancer Father 20    leukemia  . Stroke Father   . Hypertension Father   . Heart disease Sister 56    MI  . Arthritis Brother   . Heart disease Maternal Uncle   . Stroke Maternal Uncle   . Uterine cancer Paternal Grandmother   . Stomach cancer Paternal Grandfather   . Heart disease Maternal Uncle   . Heart disease Maternal Uncle     History   Social History  . Marital Status: Widowed    Spouse Name: N/A    Number of Children: N/A  . Years of Education: N/A   Occupational History  . 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 Smoking status: Former Smoker -- 1.50 packs/day for 20 years    Types: Cigarettes    Quit date: 08/02/1984  . Smokeless tobacco: Never Used  . Alcohol Use: No  . Drug Use: No  . Sexual Activity:    Partners: Male   Other Topics Concern  . None   Social  History Narrative   NO REG EXERCISE   Caffeine use: daily   Review of Systems - See HPI.  All other ROS are negative.  BP 160/70 mmHg  Pulse 90  Temp(Src) 97.6 F (36.4 C) (Oral)  Resp 18  Ht 5\' 4"  (1.626 m)  Wt 259 lb (117.482 kg)  BMI 44.44 kg/m2  SpO2 99%  Physical Exam  Constitutional: She is oriented to person, place, and time and well-developed, well-nourished, and in no distress.  HENT:  Head: Normocephalic and atraumatic.  Right Ear: External ear normal.  Left Ear: External ear normal.  Nose: Nose normal.  Mouth/Throat: Oropharynx is clear and moist. No oropharyngeal exudate.  TM within normal limits bilaterally.  Eyes: Conjunctivae are normal. Pupils are equal, round, and reactive to light.  Neck: Neck supple.  Cardiovascular: Normal rate, regular rhythm, normal heart sounds and intact distal pulses.     Pulmonary/Chest: Effort normal and breath sounds normal. No respiratory distress. She has no wheezes. She has no rales. She exhibits no tenderness.  Neurological: She is alert and oriented to person, place, and time.  Skin: Skin is warm and dry. No rash noted.  Psychiatric: Affect normal.  Vitals reviewed.   Recent Results (from the past 2160 hour(s))  POCT Glucose (CBG)     Status: Abnormal   Collection Time: 11/29/13 12:02 PM  Result Value Ref Range   POC Glucose 143 (A) 70 - 99 mg/dl  HgB A1c     Status: None   Collection Time: 01/11/14 11:32 AM  Result Value Ref Range   Hgb A1c MFr Bld 5.8 4.6 - 6.5 %    Comment: Glycemic Control Guidelines for People with Diabetes:Non Diabetic:  <6%Goal of Therapy: <7%Additional Action Suggested:  >8%   Hepatic function panel     Status: Abnormal   Collection Time: 02/05/14  9:25 AM  Result Value Ref Range   Total Bilirubin 0.6 0.2 - 1.2 mg/dL   Bilirubin, Direct 0.1 0.0 - 0.3 mg/dL   Alkaline Phosphatase 59 39 - 117 U/L   AST 25 0 - 37 U/L   ALT 23 0 - 35 U/L   Total Protein 7.5 6.0 - 8.3 g/dL   Albumin 3.4 (L) 3.5 - 5.2 g/dL  Lipid panel     Status: None   Collection Time: 02/05/14  9:25 AM  Result Value Ref Range   Cholesterol 136 0 - 200 mg/dL    Comment: ATP III Classification       Desirable:  < 200 mg/dL               Borderline High:  200 - 239 mg/dL          High:  > = 240 mg/dL   Triglycerides 111.0 0.0 - 149.0 mg/dL    Comment: Normal:  <150 mg/dLBorderline High:  150 - 199 mg/dL   HDL 47.30 >39.00 mg/dL   VLDL 22.2 0.0 - 40.0 mg/dL   LDL Cholesterol 67 0 - 99 mg/dL   Total CHOL/HDL Ratio 3     Comment:                Men          Women1/2 Average Risk     3.4          3.3Average Risk          5.0          4.42X Average Risk  9.6          7.13X Average Risk          15.0          11.0                       NonHDL 88.70     Comment: NOTE:  Non-HDL goal should be 30 mg/dL higher than patient's LDL goal (i.e. LDL goal of  < 70 mg/dL, would have non-HDL goal of < 100 mg/dL)  Microalbumin / creatinine urine ratio     Status: None   Collection Time: 02/05/14  9:25 AM  Result Value Ref Range   Microalb, Ur 1.5 0.0 - 1.9 mg/dL   Creatinine,U 67.0 mg/dL   Microalb Creat Ratio 2.2 0.0 - 30.0 mg/g  Basic metabolic panel     Status: Abnormal   Collection Time: 02/05/14  9:25 AM  Result Value Ref Range   Sodium 138 135 - 145 mEq/L   Potassium 3.9 3.5 - 5.1 mEq/L   Chloride 106 96 - 112 mEq/L   CO2 23 19 - 32 mEq/L   Glucose, Bld 105 (H) 70 - 99 mg/dL   BUN 27 (H) 6 - 23 mg/dL   Creatinine, Ser 1.2 0.4 - 1.2 mg/dL   Calcium 9.1 8.4 - 10.5 mg/dL   GFR 47.83 (L) >60.00 mL/min  POCT urinalysis dipstick     Status: None   Collection Time: 02/05/14 11:33 AM  Result Value Ref Range   Color, UA yellow    Clarity, UA clear    Glucose, UA neg    Bilirubin, UA neg    Ketones, UA neg    Spec Grav, UA 1.025    Blood, UA neg    pH, UA 6.0    Protein, UA neg    Urobilinogen, UA 0.2    Nitrite, UA neg    Leukocytes, UA Negative     Assessment/Plan: Viral URI with cough 1-day of symptoms.  Afebrile.  Exam within normal limits.  Encouraged increase fluids and rest.  Will Rx cough medication (Tessalon) to help with cough.  Resume asthma medications to help prevent exacerbation.  Plain Mucinex. Place humidifier in bedroom.  Return precautions discussed with patient.

## 2014-02-22 ENCOUNTER — Telehealth: Payer: Self-pay | Admitting: Family Medicine

## 2014-02-22 MED ORDER — AMOXICILLIN 875 MG PO TABS: 875.0000 mg | ORAL_TABLET | Freq: Two times a day (BID) | ORAL | Status: DC

## 2014-02-22 NOTE — Telephone Encounter (Signed)
Caller name: Neeya, [place] Relation to pt: self  Call back number: [phone]   Reason for call:  Pt states she is not feeling better, coughing up phlegm and expercing head ache.

## 2014-02-22 NOTE — Telephone Encounter (Signed)
Please advise, pt called as advised on AVS.

## 2014-02-22 NOTE — Telephone Encounter (Signed)
Pt notified rx filled. States she will follow up if no improvement.

## 2014-02-22 NOTE — Telephone Encounter (Signed)
Rx Amoicillin BID x 7 days sent to pharmacy.  Continue other measures discussed at visit.  Follow-up if no improvement on antibiotic.

## 2014-02-25 ENCOUNTER — Other Ambulatory Visit: Payer: Self-pay | Admitting: Internal Medicine

## 2014-02-26 DIAGNOSIS — S39012D Strain of muscle, fascia and tendon of lower back, subsequent encounter: Secondary | ICD-10-CM | POA: Diagnosis not present

## 2014-03-01 DIAGNOSIS — R0602 Shortness of breath: Secondary | ICD-10-CM | POA: Diagnosis not present

## 2014-03-01 DIAGNOSIS — J810 Acute pulmonary edema: Secondary | ICD-10-CM | POA: Diagnosis not present

## 2014-03-01 DIAGNOSIS — J441 Chronic obstructive pulmonary disease with (acute) exacerbation: Secondary | ICD-10-CM | POA: Diagnosis not present

## 2014-03-01 DIAGNOSIS — R05 Cough: Secondary | ICD-10-CM | POA: Diagnosis not present

## 2014-03-02 DIAGNOSIS — E785 Hyperlipidemia, unspecified: Secondary | ICD-10-CM | POA: Diagnosis present

## 2014-03-02 DIAGNOSIS — I10 Essential (primary) hypertension: Secondary | ICD-10-CM | POA: Diagnosis not present

## 2014-03-02 DIAGNOSIS — N183 Chronic kidney disease, stage 3 (moderate): Secondary | ICD-10-CM | POA: Diagnosis present

## 2014-03-02 DIAGNOSIS — D631 Anemia in chronic kidney disease: Secondary | ICD-10-CM | POA: Diagnosis not present

## 2014-03-02 DIAGNOSIS — J441 Chronic obstructive pulmonary disease with (acute) exacerbation: Secondary | ICD-10-CM | POA: Diagnosis not present

## 2014-03-02 DIAGNOSIS — Z79899 Other long term (current) drug therapy: Secondary | ICD-10-CM | POA: Diagnosis not present

## 2014-03-02 DIAGNOSIS — Z823 Family history of stroke: Secondary | ICD-10-CM | POA: Diagnosis not present

## 2014-03-02 DIAGNOSIS — D649 Anemia, unspecified: Secondary | ICD-10-CM | POA: Diagnosis not present

## 2014-03-02 DIAGNOSIS — J810 Acute pulmonary edema: Secondary | ICD-10-CM | POA: Diagnosis not present

## 2014-03-02 DIAGNOSIS — Z6841 Body Mass Index (BMI) 40.0 and over, adult: Secondary | ICD-10-CM | POA: Diagnosis not present

## 2014-03-02 DIAGNOSIS — R05 Cough: Secondary | ICD-10-CM | POA: Diagnosis not present

## 2014-03-02 DIAGNOSIS — Z808 Family history of malignant neoplasm of other organs or systems: Secondary | ICD-10-CM | POA: Diagnosis not present

## 2014-03-02 DIAGNOSIS — F410 Panic disorder [episodic paroxysmal anxiety] without agoraphobia: Secondary | ICD-10-CM | POA: Diagnosis present

## 2014-03-02 DIAGNOSIS — I129 Hypertensive chronic kidney disease with stage 1 through stage 4 chronic kidney disease, or unspecified chronic kidney disease: Secondary | ICD-10-CM | POA: Diagnosis not present

## 2014-03-02 DIAGNOSIS — Z806 Family history of leukemia: Secondary | ICD-10-CM | POA: Diagnosis not present

## 2014-03-02 DIAGNOSIS — F329 Major depressive disorder, single episode, unspecified: Secondary | ICD-10-CM | POA: Diagnosis present

## 2014-03-02 DIAGNOSIS — N179 Acute kidney failure, unspecified: Secondary | ICD-10-CM | POA: Diagnosis present

## 2014-03-02 DIAGNOSIS — R0602 Shortness of breath: Secondary | ICD-10-CM | POA: Diagnosis not present

## 2014-03-02 DIAGNOSIS — G4733 Obstructive sleep apnea (adult) (pediatric): Secondary | ICD-10-CM | POA: Diagnosis present

## 2014-03-02 DIAGNOSIS — I509 Heart failure, unspecified: Secondary | ICD-10-CM | POA: Diagnosis not present

## 2014-03-02 DIAGNOSIS — N182 Chronic kidney disease, stage 2 (mild): Secondary | ICD-10-CM | POA: Diagnosis not present

## 2014-03-02 DIAGNOSIS — I5031 Acute diastolic (congestive) heart failure: Secondary | ICD-10-CM | POA: Diagnosis present

## 2014-03-02 DIAGNOSIS — J189 Pneumonia, unspecified organism: Secondary | ICD-10-CM | POA: Diagnosis not present

## 2014-03-02 DIAGNOSIS — D509 Iron deficiency anemia, unspecified: Secondary | ICD-10-CM | POA: Diagnosis present

## 2014-03-02 DIAGNOSIS — Z87891 Personal history of nicotine dependence: Secondary | ICD-10-CM | POA: Diagnosis not present

## 2014-03-02 DIAGNOSIS — E119 Type 2 diabetes mellitus without complications: Secondary | ICD-10-CM | POA: Diagnosis not present

## 2014-03-02 DIAGNOSIS — G4730 Sleep apnea, unspecified: Secondary | ICD-10-CM | POA: Diagnosis not present

## 2014-03-02 DIAGNOSIS — F419 Anxiety disorder, unspecified: Secondary | ICD-10-CM | POA: Diagnosis present

## 2014-03-02 DIAGNOSIS — R609 Edema, unspecified: Secondary | ICD-10-CM | POA: Diagnosis not present

## 2014-03-04 ENCOUNTER — Ambulatory Visit: Payer: Medicare Other | Admitting: Family Medicine

## 2014-03-07 ENCOUNTER — Telehealth: Payer: Self-pay

## 2014-03-07 NOTE — Telephone Encounter (Signed)
Pt is still in the hospital.  She is unsure of her expected discharge date.  She has a hospital follow appointment already scheduled for 03/11/14.  She was instructed to call us if she's still in the hospital on 03/11/14. Pt agreed.

## 2014-03-11 ENCOUNTER — Ambulatory Visit: Payer: Medicare Other | Admitting: Family Medicine

## 2014-03-11 DIAGNOSIS — E119 Type 2 diabetes mellitus without complications: Secondary | ICD-10-CM | POA: Diagnosis not present

## 2014-03-11 DIAGNOSIS — F419 Anxiety disorder, unspecified: Secondary | ICD-10-CM | POA: Diagnosis not present

## 2014-03-11 DIAGNOSIS — J189 Pneumonia, unspecified organism: Secondary | ICD-10-CM | POA: Diagnosis not present

## 2014-03-11 DIAGNOSIS — D649 Anemia, unspecified: Secondary | ICD-10-CM | POA: Diagnosis not present

## 2014-03-11 DIAGNOSIS — I5031 Acute diastolic (congestive) heart failure: Secondary | ICD-10-CM | POA: Diagnosis not present

## 2014-03-11 DIAGNOSIS — I10 Essential (primary) hypertension: Secondary | ICD-10-CM | POA: Diagnosis not present

## 2014-03-11 DIAGNOSIS — F329 Major depressive disorder, single episode, unspecified: Secondary | ICD-10-CM | POA: Diagnosis not present

## 2014-03-12 DIAGNOSIS — D649 Anemia, unspecified: Secondary | ICD-10-CM | POA: Diagnosis not present

## 2014-03-12 DIAGNOSIS — F329 Major depressive disorder, single episode, unspecified: Secondary | ICD-10-CM | POA: Diagnosis not present

## 2014-03-12 DIAGNOSIS — I5031 Acute diastolic (congestive) heart failure: Secondary | ICD-10-CM | POA: Diagnosis not present

## 2014-03-12 DIAGNOSIS — I10 Essential (primary) hypertension: Secondary | ICD-10-CM | POA: Diagnosis not present

## 2014-03-12 DIAGNOSIS — E119 Type 2 diabetes mellitus without complications: Secondary | ICD-10-CM | POA: Diagnosis not present

## 2014-03-12 DIAGNOSIS — J189 Pneumonia, unspecified organism: Secondary | ICD-10-CM | POA: Diagnosis not present

## 2014-03-13 ENCOUNTER — Other Ambulatory Visit: Payer: Self-pay | Admitting: Internal Medicine

## 2014-03-13 DIAGNOSIS — I5031 Acute diastolic (congestive) heart failure: Secondary | ICD-10-CM | POA: Diagnosis not present

## 2014-03-13 DIAGNOSIS — I10 Essential (primary) hypertension: Secondary | ICD-10-CM | POA: Diagnosis not present

## 2014-03-13 DIAGNOSIS — E119 Type 2 diabetes mellitus without complications: Secondary | ICD-10-CM | POA: Diagnosis not present

## 2014-03-13 DIAGNOSIS — F329 Major depressive disorder, single episode, unspecified: Secondary | ICD-10-CM | POA: Diagnosis not present

## 2014-03-13 DIAGNOSIS — D649 Anemia, unspecified: Secondary | ICD-10-CM | POA: Diagnosis not present

## 2014-03-13 DIAGNOSIS — J189 Pneumonia, unspecified organism: Secondary | ICD-10-CM | POA: Diagnosis not present

## 2014-03-14 DIAGNOSIS — F329 Major depressive disorder, single episode, unspecified: Secondary | ICD-10-CM | POA: Diagnosis not present

## 2014-03-14 DIAGNOSIS — E119 Type 2 diabetes mellitus without complications: Secondary | ICD-10-CM | POA: Diagnosis not present

## 2014-03-14 DIAGNOSIS — D649 Anemia, unspecified: Secondary | ICD-10-CM | POA: Diagnosis not present

## 2014-03-14 DIAGNOSIS — J189 Pneumonia, unspecified organism: Secondary | ICD-10-CM | POA: Diagnosis not present

## 2014-03-14 DIAGNOSIS — I10 Essential (primary) hypertension: Secondary | ICD-10-CM | POA: Diagnosis not present

## 2014-03-14 DIAGNOSIS — I5031 Acute diastolic (congestive) heart failure: Secondary | ICD-10-CM | POA: Diagnosis not present

## 2014-03-15 ENCOUNTER — Ambulatory Visit (INDEPENDENT_AMBULATORY_CARE_PROVIDER_SITE_OTHER): Payer: Medicare Other | Admitting: Family Medicine

## 2014-03-15 ENCOUNTER — Encounter: Payer: Self-pay | Admitting: Family Medicine

## 2014-03-15 VITALS — BP 130/59 | HR 88 | Temp 97.8°F | Wt 251.0 lb

## 2014-03-15 DIAGNOSIS — J189 Pneumonia, unspecified organism: Secondary | ICD-10-CM | POA: Diagnosis not present

## 2014-03-15 DIAGNOSIS — I509 Heart failure, unspecified: Secondary | ICD-10-CM

## 2014-03-15 DIAGNOSIS — E119 Type 2 diabetes mellitus without complications: Secondary | ICD-10-CM | POA: Diagnosis not present

## 2014-03-15 NOTE — Patient Instructions (Signed)

## 2014-03-15 NOTE — Progress Notes (Signed)
Pre visit review using our clinic review tool, if applicable. No additional management support is needed unless otherwise documented below in the visit note. 

## 2014-03-15 NOTE — Progress Notes (Signed)
Subjective:    Patient ID: Erin [name], female    DOB: 1945/09/14, 68 y.o.   MRN: [id number]  HPI Pt here f/u hosp for pneumonia.  After she was here in Nov she worsened . She called back and PA called in an abx but she con't to worsen--- she then went to [facility] ER and was admitted for pneumonia.  She was in the hospital 7 days and was d/c last Saturday.  Pt is feeling tired but she is better.  She said she was told she was in CHF during hospitalization too.  She is on O2 cont at home.  Past Medical History  Diagnosis Date  . Depression   . Hyperlipidemia   . Chronic kidney disease   . CHF (congestive heart failure)     hospital 11/2013-- [place]   History   Social History  . Marital Status: Widowed    Spouse Name: N/A    Number of Children: N/A  . Years of Education: N/A   Occupational History  . 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 Smoking status: Former Smoker -- 1.50 packs/day for 20 years    Types: Cigarettes    Quit date: 08/02/1984  . Smokeless tobacco: Never Used  . Alcohol Use: No  . Drug Use: No  . Sexual Activity:    Partners: Male   Other Topics Concern  . Not on file   Social History Narrative   NO REG EXERCISE   Caffeine use: daily   Family History  Problem Relation Age of Onset  . Hypertension Mother   . Alzheimer's disease Mother   . Hyperlipidemia Mother   . Heart disease Mother     cad  . Asthma Father   . Cancer Father 38    leukemia  . Stroke Father   . Hypertension Father   . Heart disease Sister 69    MI  . Arthritis Brother   . Heart disease Maternal Uncle   . Stroke Maternal Uncle   . Uterine cancer Paternal Grandmother   . Stomach cancer Paternal Grandfather   . Heart disease Maternal Uncle   . Heart disease Maternal Uncle    Current Outpatient Prescriptions  Medication Sig Dispense Refill  . albuterol (PROVENTIL) (2.5 MG/3ML) 0.083% nebulizer solution Take 2.5 mg by nebulization every 6 (six) hours  as needed for wheezing or shortness of breath.    . ALPRAZolam (XANAX) 0.5 MG tablet Take 1 tablet by mouth 3 (three) times daily as needed.    Marland Kitchen atorvastatin (LIPITOR) 40 MG tablet Take 1 tablet (40 mg total) by mouth daily. 30 tablet 5  . busPIRone (BUSPAR) 10 MG tablet Take 1 tablet by mouth 2 (two) times daily.    . carvedilol (COREG) 3.125 MG tablet Take 1 tablet by mouth every 12 (twelve) hours.    . fenofibrate 160 MG tablet Take 1 tablet (160 mg total) by mouth daily. 90 tablet 1  . Fluticasone-Salmeterol (ADVAIR DISKUS) 250-50 MCG/DOSE AEPB Inhale 1 puff into the lungs 2 (two) times daily. 1 each 3  . glipiZIDE (GLUCOTROL) 5 MG tablet TAKE TWO TABLETS BY MOUTH TWICE DAILY AS INSTRUCTED 60 tablet 0  . hydrALAZINE (APRESOLINE) 50 MG tablet Take 1 tablet (50 mg total) by mouth 3 (three) times daily. (Patient taking differently: Take 100 mg by mouth 3 (three) times daily. ) 90 tablet 5  . HYDROcodone-acetaminophen (NORCO) 10-325 MG per tablet Take 1 tablet by mouth every  8 (eight) hours as needed.    . isosorbide mononitrate (IMDUR) 30 MG 24 hr tablet Take 1 tablet by mouth daily.    . pantoprazole (PROTONIX) 40 MG tablet Take 1 tablet (40 mg total) by mouth daily. (Patient taking differently: Take 40 mg by mouth 2 (two) times daily. ) 30 tablet 11  . pramipexole (MIRAPEX) 1 MG tablet TAKE ONE TABLET BY MOUTH AT BEDTIME 30 tablet 5  . sertraline (ZOLOFT) 50 MG tablet Take 1 tablet (50 mg total) by mouth daily. (Patient taking differently: Take 75 mg by mouth daily. ) 90 tablet 3  . torsemide (DEMADEX) 10 MG tablet Take 1 tablet by mouth 2 (two) times daily.    . TRADJENTA 5 MG TABS tablet TAKE ONE TABLET BY MOUTH ONCE DAILY 30 tablet 0  . amLODipine (NORVASC) 10 MG tablet Take 1 tablet (10 mg total) by mouth daily. (Patient not taking: Reported on 03/15/2014) 90 tablet 1   No current facility-administered medications for this visit.    Review of Systems As above    Objective:   Physical  Exam BP 130/59 mmHg  Pulse 88  Temp(Src) 97.8 F (36.6 C) (Oral)  Wt 251 lb (113.853 kg)  SpO2 88%---  Off O2 because tank had leak General appearance: alert, cooperative, appears stated age and no distress Neck: no adenopathy, no carotid bruit, no JVD, supple, symmetrical, trachea midline and thyroid not enlarged, symmetric, no tenderness/mass/nodules Lungs: clear to auscultation bilaterally-- on O2 Heart: S1, S2 normal Extremities: edema tr pitting edema B/L        Assessment & Plan:  1. Chronic congestive heart failure, unspecified congestive heart failure type    con't meds - 2D Echocardiogram without contrast; Future - Ambulatory referral to Cardiology  2. CAP (community acquired pneumonia) Finished meds-- repeat xray in 1 weeks

## 2014-03-15 NOTE — Assessment & Plan Note (Signed)
Refer to cardiology Check 2D echo

## 2014-03-20 DIAGNOSIS — I10 Essential (primary) hypertension: Secondary | ICD-10-CM | POA: Diagnosis not present

## 2014-03-20 DIAGNOSIS — I5031 Acute diastolic (congestive) heart failure: Secondary | ICD-10-CM | POA: Diagnosis not present

## 2014-03-20 DIAGNOSIS — J189 Pneumonia, unspecified organism: Secondary | ICD-10-CM | POA: Diagnosis not present

## 2014-03-20 DIAGNOSIS — E119 Type 2 diabetes mellitus without complications: Secondary | ICD-10-CM | POA: Diagnosis not present

## 2014-03-20 DIAGNOSIS — F329 Major depressive disorder, single episode, unspecified: Secondary | ICD-10-CM | POA: Diagnosis not present

## 2014-03-20 DIAGNOSIS — D649 Anemia, unspecified: Secondary | ICD-10-CM | POA: Diagnosis not present

## 2014-03-22 ENCOUNTER — Ambulatory Visit (HOSPITAL_COMMUNITY): Payer: Medicare Other

## 2014-03-22 DIAGNOSIS — I5031 Acute diastolic (congestive) heart failure: Secondary | ICD-10-CM | POA: Diagnosis not present

## 2014-03-22 DIAGNOSIS — D649 Anemia, unspecified: Secondary | ICD-10-CM | POA: Diagnosis not present

## 2014-03-22 DIAGNOSIS — F329 Major depressive disorder, single episode, unspecified: Secondary | ICD-10-CM | POA: Diagnosis not present

## 2014-03-22 DIAGNOSIS — J189 Pneumonia, unspecified organism: Secondary | ICD-10-CM | POA: Diagnosis not present

## 2014-03-22 DIAGNOSIS — I10 Essential (primary) hypertension: Secondary | ICD-10-CM | POA: Diagnosis not present

## 2014-03-22 DIAGNOSIS — E119 Type 2 diabetes mellitus without complications: Secondary | ICD-10-CM | POA: Diagnosis not present

## 2014-03-25 ENCOUNTER — Telehealth: Payer: Self-pay | Admitting: Family Medicine

## 2014-03-25 ENCOUNTER — Ambulatory Visit (HOSPITAL_COMMUNITY)
Admission: RE | Admit: 2014-03-25 | Discharge: 2014-03-25 | Disposition: A | Discharge status: Home / Self Care | Payer: Medicare Other | Source: Ambulatory Visit | Attending: Family Medicine | Admitting: Family Medicine

## 2014-03-25 DIAGNOSIS — R0609 Other forms of dyspnea: Secondary | ICD-10-CM | POA: Diagnosis not present

## 2014-03-25 DIAGNOSIS — I10 Essential (primary) hypertension: Secondary | ICD-10-CM | POA: Insufficient documentation

## 2014-03-25 DIAGNOSIS — I509 Heart failure, unspecified: Secondary | ICD-10-CM

## 2014-03-25 DIAGNOSIS — I369 Nonrheumatic tricuspid valve disorder, unspecified: Secondary | ICD-10-CM | POA: Diagnosis not present

## 2014-03-25 DIAGNOSIS — E785 Hyperlipidemia, unspecified: Secondary | ICD-10-CM | POA: Diagnosis not present

## 2014-03-25 NOTE — Telephone Encounter (Signed)
Noted  

## 2014-03-25 NOTE — Progress Notes (Signed)
Echo Lab  2D Echocardiogram completed.  [place], RDCS 03/25/2014 11:49 AM

## 2014-03-25 NOTE — Telephone Encounter (Signed)
Pt was transferred to [facility] due to her stating she is experiencing labored breathing. Pt insisted on seeing Dr. Etter Sjogren only

## 2014-03-26 ENCOUNTER — Encounter: Payer: Self-pay | Admitting: Family Medicine

## 2014-03-26 ENCOUNTER — Ambulatory Visit (HOSPITAL_BASED_OUTPATIENT_CLINIC_OR_DEPARTMENT_OTHER)
Admission: RE | Admit: 2014-03-26 | Discharge: 2014-03-26 | Disposition: A | Discharge status: Home / Self Care | Payer: Medicare Other | Source: Ambulatory Visit | Attending: Family Medicine | Admitting: Family Medicine

## 2014-03-26 ENCOUNTER — Ambulatory Visit (INDEPENDENT_AMBULATORY_CARE_PROVIDER_SITE_OTHER): Payer: Medicare Other | Admitting: Family Medicine

## 2014-03-26 VITALS — BP 148/62 | HR 89 | Temp 98.4°F | Resp 24 | Wt 260.2 lb

## 2014-03-26 DIAGNOSIS — F329 Major depressive disorder, single episode, unspecified: Secondary | ICD-10-CM | POA: Diagnosis not present

## 2014-03-26 DIAGNOSIS — R0602 Shortness of breath: Secondary | ICD-10-CM | POA: Insufficient documentation

## 2014-03-26 DIAGNOSIS — I517 Cardiomegaly: Secondary | ICD-10-CM | POA: Insufficient documentation

## 2014-03-26 DIAGNOSIS — I5031 Acute diastolic (congestive) heart failure: Secondary | ICD-10-CM | POA: Diagnosis not present

## 2014-03-26 DIAGNOSIS — I10 Essential (primary) hypertension: Secondary | ICD-10-CM | POA: Diagnosis not present

## 2014-03-26 DIAGNOSIS — J45909 Unspecified asthma, uncomplicated: Secondary | ICD-10-CM | POA: Diagnosis not present

## 2014-03-26 DIAGNOSIS — E119 Type 2 diabetes mellitus without complications: Secondary | ICD-10-CM | POA: Diagnosis not present

## 2014-03-26 DIAGNOSIS — J189 Pneumonia, unspecified organism: Secondary | ICD-10-CM | POA: Diagnosis not present

## 2014-03-26 DIAGNOSIS — D649 Anemia, unspecified: Secondary | ICD-10-CM | POA: Diagnosis not present

## 2014-03-26 DIAGNOSIS — N170 Acute kidney failure with tubular necrosis: Secondary | ICD-10-CM | POA: Diagnosis not present

## 2014-03-26 DIAGNOSIS — Z8701 Personal history of pneumonia (recurrent): Secondary | ICD-10-CM | POA: Diagnosis not present

## 2014-03-26 DIAGNOSIS — D631 Anemia in chronic kidney disease: Secondary | ICD-10-CM | POA: Diagnosis not present

## 2014-03-26 DIAGNOSIS — N189 Chronic kidney disease, unspecified: Secondary | ICD-10-CM | POA: Diagnosis not present

## 2014-03-26 NOTE — Progress Notes (Signed)
Subjective:    Patient ID: Erin [name], female    DOB: October 09, 1945, 68 y.o.   MRN: [id number]  HPI Pt here c/o increasing sob , no cp.  Pt is on 2 L O2.   Sob worse with exertion.   No fevers.  No cough.    Past Medical History  Diagnosis Date  . Depression   . Hyperlipidemia   . Chronic kidney disease   . CHF (congestive heart failure)     hospital 11/2013-- [place]   History   Social History  . Marital Status: Widowed    Spouse Name: N/A    Number of Children: N/A  . Years of Education: N/A   Occupational History  . 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 Smoking status: Former Smoker -- 1.50 packs/day for 20 years    Types: Cigarettes    Quit date: 08/02/1984  . Smokeless tobacco: Never Used  . Alcohol Use: No  . Drug Use: No  . Sexual Activity:    Partners: Male   Other Topics Concern  . Not on file   Social History Narrative   NO REG EXERCISE   Caffeine use: daily   Current Outpatient Prescriptions  Medication Sig Dispense Refill  . albuterol (PROVENTIL) (2.5 MG/3ML) 0.083% nebulizer solution Take 2.5 mg by nebulization every 6 (six) hours as needed for wheezing or shortness of breath.    . ALPRAZolam (XANAX) 0.5 MG tablet Take 1 tablet by mouth 3 (three) times daily as needed.    Marland Kitchen amLODipine (NORVASC) 10 MG tablet Take 1 tablet (10 mg total) by mouth daily. 90 tablet 1  . atorvastatin (LIPITOR) 40 MG tablet Take 1 tablet (40 mg total) by mouth daily. 30 tablet 5  . busPIRone (BUSPAR) 10 MG tablet Take 1 tablet by mouth 2 (two) times daily.    . carvedilol (COREG) 3.125 MG tablet Take 1 tablet by mouth every 12 (twelve) hours.    . fenofibrate 160 MG tablet Take 1 tablet (160 mg total) by mouth daily. 90 tablet 1  . Fluticasone-Salmeterol (ADVAIR DISKUS) 250-50 MCG/DOSE AEPB Inhale 1 puff into the lungs 2 (two) times daily. 1 each 3  . furosemide (LASIX) 20 MG tablet Take 2 tablets by mouth daily.    Marland Kitchen glipiZIDE (GLUCOTROL) 5 MG  tablet TAKE TWO TABLETS BY MOUTH TWICE DAILY AS INSTRUCTED 60 tablet 0  . hydrALAZINE (APRESOLINE) 50 MG tablet Take 1 tablet (50 mg total) by mouth 3 (three) times daily. (Patient taking differently: Take 100 mg by mouth 3 (three) times daily. ) 90 tablet 5  . HYDROcodone-acetaminophen (NORCO) 10-325 MG per tablet Take 1 tablet by mouth every 8 (eight) hours as needed.    . isosorbide mononitrate (IMDUR) 30 MG 24 hr tablet Take 1 tablet by mouth daily.    . pantoprazole (PROTONIX) 40 MG tablet Take 1 tablet (40 mg total) by mouth daily. (Patient taking differently: Take 40 mg by mouth 2 (two) times daily. ) 30 tablet 11  . pramipexole (MIRAPEX) 1 MG tablet TAKE ONE TABLET BY MOUTH AT BEDTIME 30 tablet 5  . sertraline (ZOLOFT) 50 MG tablet Take 1 tablet (50 mg total) by mouth daily. (Patient taking differently: Take 75 mg by mouth daily. ) 90 tablet 3  . torsemide (DEMADEX) 10 MG tablet Take 1 tablet by mouth 2 (two) times daily.    . TRADJENTA 5 MG TABS tablet TAKE ONE TABLET BY MOUTH ONCE DAILY 30  tablet 0   No current facility-administered medications for this visit.    Review of Systems As above    Objective:   Physical Exam  BP 148/62 mmHg  Pulse 89  Temp(Src) 98.4 F (36.9 C) (Oral)  Resp 24  Wt 260 lb 3.2 oz (118.026 kg)  SpO2 98% General appearance: alert, cooperative, appears stated age and no distress Neck: no adenopathy, no carotid bruit, no JVD, supple, symmetrical, trachea midline and thyroid not enlarged, symmetric, no tenderness/mass/nodules Lungs: diminished breath sounds bilaterally Heart: S1, S2 normal Extremities: extremities normal, atraumatic, no cyanosis or edema      Assessment & Plan:  1. SOB (shortness of breath)--- hx CAP Check xray stat--- po dropped to 95% with walking on o2 Consider CT - DG Chest 2 View; Future

## 2014-03-26 NOTE — Patient Instructions (Signed)

## 2014-03-26 NOTE — Progress Notes (Signed)
Pre visit review using our clinic review tool, if applicable. No additional management support is needed unless otherwise documented below in the visit note. 

## 2014-03-27 ENCOUNTER — Telehealth: Payer: Self-pay | Admitting: Family Medicine

## 2014-03-27 ENCOUNTER — Telehealth: Payer: Self-pay | Admitting: *Deleted

## 2014-03-27 NOTE — Telephone Encounter (Signed)
Received home health certification and plan of care via fax from [facility]. Forwarded to Dr. Etter Sjogren. JG//CMA

## 2014-03-27 NOTE — Telephone Encounter (Signed)
Verbal has been given to [place] to extend the PT.     KP

## 2014-03-27 NOTE — Telephone Encounter (Signed)
Caller name: Zenia Resides --advanced Relation to pt: Call back number: [phone] Pharmacy:  Reason for call:   Needs verbal orders to extend PT.

## 2014-04-01 DIAGNOSIS — R0609 Other forms of dyspnea: Secondary | ICD-10-CM

## 2014-04-01 DIAGNOSIS — E119 Type 2 diabetes mellitus without complications: Secondary | ICD-10-CM | POA: Diagnosis not present

## 2014-04-01 DIAGNOSIS — I509 Heart failure, unspecified: Secondary | ICD-10-CM | POA: Diagnosis not present

## 2014-04-01 DIAGNOSIS — J449 Chronic obstructive pulmonary disease, unspecified: Secondary | ICD-10-CM

## 2014-04-02 DIAGNOSIS — D649 Anemia, unspecified: Secondary | ICD-10-CM | POA: Diagnosis not present

## 2014-04-02 DIAGNOSIS — J189 Pneumonia, unspecified organism: Secondary | ICD-10-CM | POA: Diagnosis not present

## 2014-04-02 DIAGNOSIS — R0600 Dyspnea, unspecified: Secondary | ICD-10-CM | POA: Diagnosis not present

## 2014-04-02 DIAGNOSIS — I10 Essential (primary) hypertension: Secondary | ICD-10-CM | POA: Diagnosis not present

## 2014-04-02 DIAGNOSIS — G4733 Obstructive sleep apnea (adult) (pediatric): Secondary | ICD-10-CM | POA: Diagnosis not present

## 2014-04-02 DIAGNOSIS — E119 Type 2 diabetes mellitus without complications: Secondary | ICD-10-CM | POA: Diagnosis not present

## 2014-04-02 DIAGNOSIS — R911 Solitary pulmonary nodule: Secondary | ICD-10-CM | POA: Diagnosis not present

## 2014-04-02 DIAGNOSIS — I5031 Acute diastolic (congestive) heart failure: Secondary | ICD-10-CM | POA: Diagnosis not present

## 2014-04-02 DIAGNOSIS — F411 Generalized anxiety disorder: Secondary | ICD-10-CM | POA: Diagnosis not present

## 2014-04-02 DIAGNOSIS — F329 Major depressive disorder, single episode, unspecified: Secondary | ICD-10-CM | POA: Diagnosis not present

## 2014-04-02 NOTE — Telephone Encounter (Signed)
Forms reviewed and signed by provider.  Faxed.  Fax confirmation received.

## 2014-04-04 ENCOUNTER — Other Ambulatory Visit: Payer: Self-pay | Admitting: Internal Medicine

## 2014-04-07 DIAGNOSIS — E889 Metabolic disorder, unspecified: Secondary | ICD-10-CM | POA: Diagnosis not present

## 2014-04-07 DIAGNOSIS — M9080 Osteopathy in diseases classified elsewhere, unspecified site: Secondary | ICD-10-CM | POA: Diagnosis not present

## 2014-04-07 DIAGNOSIS — I129 Hypertensive chronic kidney disease with stage 1 through stage 4 chronic kidney disease, or unspecified chronic kidney disease: Secondary | ICD-10-CM | POA: Diagnosis not present

## 2014-04-07 DIAGNOSIS — E559 Vitamin D deficiency, unspecified: Secondary | ICD-10-CM | POA: Diagnosis not present

## 2014-04-07 DIAGNOSIS — N183 Chronic kidney disease, stage 3 (moderate): Secondary | ICD-10-CM | POA: Diagnosis not present

## 2014-04-07 DIAGNOSIS — D631 Anemia in chronic kidney disease: Secondary | ICD-10-CM | POA: Diagnosis not present

## 2014-04-07 DIAGNOSIS — N189 Chronic kidney disease, unspecified: Secondary | ICD-10-CM | POA: Diagnosis not present

## 2014-04-09 DIAGNOSIS — I129 Hypertensive chronic kidney disease with stage 1 through stage 4 chronic kidney disease, or unspecified chronic kidney disease: Secondary | ICD-10-CM | POA: Diagnosis present

## 2014-04-09 DIAGNOSIS — E118 Type 2 diabetes mellitus with unspecified complications: Secondary | ICD-10-CM | POA: Diagnosis not present

## 2014-04-09 DIAGNOSIS — J810 Acute pulmonary edema: Secondary | ICD-10-CM | POA: Diagnosis not present

## 2014-04-09 DIAGNOSIS — R0600 Dyspnea, unspecified: Secondary | ICD-10-CM | POA: Diagnosis not present

## 2014-04-09 DIAGNOSIS — R079 Chest pain, unspecified: Secondary | ICD-10-CM | POA: Diagnosis not present

## 2014-04-09 DIAGNOSIS — I10 Essential (primary) hypertension: Secondary | ICD-10-CM | POA: Diagnosis not present

## 2014-04-09 DIAGNOSIS — G4733 Obstructive sleep apnea (adult) (pediatric): Secondary | ICD-10-CM | POA: Diagnosis not present

## 2014-04-09 DIAGNOSIS — I517 Cardiomegaly: Secondary | ICD-10-CM | POA: Diagnosis not present

## 2014-04-09 DIAGNOSIS — N189 Chronic kidney disease, unspecified: Secondary | ICD-10-CM | POA: Diagnosis not present

## 2014-04-09 DIAGNOSIS — J9610 Chronic respiratory failure, unspecified whether with hypoxia or hypercapnia: Secondary | ICD-10-CM | POA: Diagnosis not present

## 2014-04-09 DIAGNOSIS — E119 Type 2 diabetes mellitus without complications: Secondary | ICD-10-CM | POA: Diagnosis not present

## 2014-04-09 DIAGNOSIS — D638 Anemia in other chronic diseases classified elsewhere: Secondary | ICD-10-CM | POA: Diagnosis present

## 2014-04-09 DIAGNOSIS — N183 Chronic kidney disease, stage 3 (moderate): Secondary | ICD-10-CM | POA: Diagnosis not present

## 2014-04-09 DIAGNOSIS — R0602 Shortness of breath: Secondary | ICD-10-CM | POA: Diagnosis not present

## 2014-04-09 DIAGNOSIS — Z6841 Body Mass Index (BMI) 40.0 and over, adult: Secondary | ICD-10-CM | POA: Diagnosis not present

## 2014-04-09 DIAGNOSIS — I509 Heart failure, unspecified: Secondary | ICD-10-CM | POA: Diagnosis not present

## 2014-04-09 DIAGNOSIS — I5033 Acute on chronic diastolic (congestive) heart failure: Secondary | ICD-10-CM | POA: Diagnosis not present

## 2014-04-09 DIAGNOSIS — F411 Generalized anxiety disorder: Secondary | ICD-10-CM | POA: Diagnosis not present

## 2014-04-09 DIAGNOSIS — J45901 Unspecified asthma with (acute) exacerbation: Secondary | ICD-10-CM | POA: Diagnosis not present

## 2014-04-09 DIAGNOSIS — R5381 Other malaise: Secondary | ICD-10-CM | POA: Diagnosis not present

## 2014-04-09 DIAGNOSIS — E669 Obesity, unspecified: Secondary | ICD-10-CM | POA: Diagnosis not present

## 2014-04-11 ENCOUNTER — Telehealth: Payer: Self-pay | Admitting: *Deleted

## 2014-04-11 ENCOUNTER — Ambulatory Visit: Payer: Medicare Other | Admitting: Cardiology

## 2014-04-11 NOTE — Telephone Encounter (Signed)
Order for folding walker and walker seat attachment signed by Dr. Etter Sjogren and faxed to [facility] at [phone] successfully. JG//CMA

## 2014-04-12 ENCOUNTER — Ambulatory Visit: Payer: Medicare Other | Admitting: Internal Medicine

## 2014-04-13 DIAGNOSIS — I5031 Acute diastolic (congestive) heart failure: Secondary | ICD-10-CM | POA: Diagnosis not present

## 2014-04-13 DIAGNOSIS — E119 Type 2 diabetes mellitus without complications: Secondary | ICD-10-CM | POA: Diagnosis not present

## 2014-04-13 DIAGNOSIS — I10 Essential (primary) hypertension: Secondary | ICD-10-CM | POA: Diagnosis not present

## 2014-04-13 DIAGNOSIS — D649 Anemia, unspecified: Secondary | ICD-10-CM | POA: Diagnosis not present

## 2014-04-13 DIAGNOSIS — J189 Pneumonia, unspecified organism: Secondary | ICD-10-CM | POA: Diagnosis not present

## 2014-04-13 DIAGNOSIS — F329 Major depressive disorder, single episode, unspecified: Secondary | ICD-10-CM | POA: Diagnosis not present

## 2014-04-15 ENCOUNTER — Telehealth: Payer: Self-pay

## 2014-04-15 DIAGNOSIS — J189 Pneumonia, unspecified organism: Secondary | ICD-10-CM | POA: Diagnosis not present

## 2014-04-15 DIAGNOSIS — F329 Major depressive disorder, single episode, unspecified: Secondary | ICD-10-CM | POA: Diagnosis not present

## 2014-04-15 DIAGNOSIS — D649 Anemia, unspecified: Secondary | ICD-10-CM | POA: Diagnosis not present

## 2014-04-15 DIAGNOSIS — I5031 Acute diastolic (congestive) heart failure: Secondary | ICD-10-CM | POA: Diagnosis not present

## 2014-04-15 DIAGNOSIS — E119 Type 2 diabetes mellitus without complications: Secondary | ICD-10-CM | POA: Diagnosis not present

## 2014-04-15 DIAGNOSIS — I10 Essential (primary) hypertension: Secondary | ICD-10-CM | POA: Diagnosis not present

## 2014-04-15 NOTE — Telephone Encounter (Signed)
Admitted: 04/09/14 Discharged: 04/12/14 Facility:  [facility] (received discharge summary--fax labeled and placed in Dr. Nonda Lou red folder for review and signature)  Reason for admission:  CHF and Asthma exacerbation  Transition Care Management Follow-up Telephone Call  How have you been since you were released from the hospital? Pt states she's been pretty weak since discharge.  She spent a bulk of time in hospital in the bed.  On last day of admission, she was able to sit up in chair all day. She denies chest pain, shortness of breath, and weight gain.  She is still coughing up yellow phlegm, but is taking Levaquin and steroid as prescribed.   She also still has some swelling in bilateral legs and feet.  Pt was encouraged to elevate legs while sitting, to avoid crossing legs, and to move them frequently.  Pt stated understanding and agreed.     Pt is weighing self daily. However, states new scale needed.     Do you understand why you were in the hospital? yes   Do you understand the discharge instructions? yes  Items Reviewed:  Medications reviewed: yes  Allergies reviewed: yes  Dietary changes reviewed: no changes  Referrals reviewed: n/a   Functional Questionnaire:   Activities of Daily Living (ADLs):   She states they are independent in the following: ambulation, ambulation, bathing and hygiene, feeding, continence, grooming, toileting and dressing States they require assistance with the following: none   Any transportation issues/concerns?: no   Any patient concerns? no   Confirmed importance and date/time of follow-up visits scheduled: yes   Confirmed with patient if condition begins to worsen call PCP or go to the ER: yes  Hospital follow up appointment scheduled for 04/18/14 @ 11:30 am with Dr. Etter Sjogren.

## 2014-04-16 ENCOUNTER — Telehealth: Payer: Self-pay

## 2014-04-16 ENCOUNTER — Telehealth: Payer: Self-pay | Admitting: Family Medicine

## 2014-04-16 DIAGNOSIS — I5031 Acute diastolic (congestive) heart failure: Secondary | ICD-10-CM | POA: Diagnosis not present

## 2014-04-16 DIAGNOSIS — D649 Anemia, unspecified: Secondary | ICD-10-CM | POA: Diagnosis not present

## 2014-04-16 DIAGNOSIS — J189 Pneumonia, unspecified organism: Secondary | ICD-10-CM | POA: Diagnosis not present

## 2014-04-16 DIAGNOSIS — I10 Essential (primary) hypertension: Secondary | ICD-10-CM | POA: Diagnosis not present

## 2014-04-16 DIAGNOSIS — F329 Major depressive disorder, single episode, unspecified: Secondary | ICD-10-CM | POA: Diagnosis not present

## 2014-04-16 DIAGNOSIS — E119 Type 2 diabetes mellitus without complications: Secondary | ICD-10-CM | POA: Diagnosis not present

## 2014-04-16 MED ORDER — ISOSORBIDE MONONITRATE ER 30 MG PO TB24: 30.0000 mg | ORAL_TABLET | Freq: Every day | ORAL | Status: DC

## 2014-04-16 MED ORDER — CARVEDILOL 3.125 MG PO TABS
3.1250 mg | ORAL_TABLET | Freq: Two times a day (BID) | ORAL | Status: DC
Start: 2014-04-16 — End: 2014-05-15

## 2014-04-16 MED ORDER — BUSPIRONE HCL 10 MG PO TABS: 10.0000 mg | ORAL_TABLET | Freq: Two times a day (BID) | ORAL | Status: DC

## 2014-04-16 NOTE — Telephone Encounter (Signed)
Order received via fax for in home SN.  Order labeled and placed in Dr. Nonda Lou red folder for review and signature.

## 2014-04-16 NOTE — Telephone Encounter (Signed)
RF for Imdur, Buspar,Coreg.  All med's sent.        KP

## 2014-04-16 NOTE — Telephone Encounter (Signed)
Caller name: Andrina, Locken Relation to pt: self  Call back number: [phone] Pharmacy:  [facility] [place], [place] [phone] (Phone)     Reason for call:  Pt requesting a refill of  carvedilol (COREG) 3.125 MG tablet & isosorbide mononitrate (IMDUR) 30 MG 24 hr tablet

## 2014-04-17 NOTE — Telephone Encounter (Signed)
Order reviewed and signed by Dr. Etter Sjogren.  Order faxed back to [facility].  Fax confirmation received.  Signed order placed in scan basket for scanning.

## 2014-04-18 ENCOUNTER — Encounter: Payer: Self-pay | Admitting: Family Medicine

## 2014-04-18 ENCOUNTER — Ambulatory Visit (INDEPENDENT_AMBULATORY_CARE_PROVIDER_SITE_OTHER): Payer: Medicare Other | Admitting: Family Medicine

## 2014-04-18 VITALS — BP 172/68 | HR 80 | Temp 97.9°F | Wt 249.4 lb

## 2014-04-18 DIAGNOSIS — F419 Anxiety disorder, unspecified: Secondary | ICD-10-CM

## 2014-04-18 DIAGNOSIS — I5023 Acute on chronic systolic (congestive) heart failure: Secondary | ICD-10-CM

## 2014-04-18 DIAGNOSIS — J189 Pneumonia, unspecified organism: Secondary | ICD-10-CM | POA: Diagnosis not present

## 2014-04-18 DIAGNOSIS — I5031 Acute diastolic (congestive) heart failure: Secondary | ICD-10-CM | POA: Diagnosis not present

## 2014-04-18 DIAGNOSIS — I5021 Acute systolic (congestive) heart failure: Secondary | ICD-10-CM | POA: Diagnosis not present

## 2014-04-18 DIAGNOSIS — I10 Essential (primary) hypertension: Secondary | ICD-10-CM | POA: Diagnosis not present

## 2014-04-18 DIAGNOSIS — G4733 Obstructive sleep apnea (adult) (pediatric): Secondary | ICD-10-CM | POA: Diagnosis not present

## 2014-04-18 DIAGNOSIS — G4700 Insomnia, unspecified: Secondary | ICD-10-CM

## 2014-04-18 DIAGNOSIS — F329 Major depressive disorder, single episode, unspecified: Secondary | ICD-10-CM | POA: Diagnosis not present

## 2014-04-18 DIAGNOSIS — E119 Type 2 diabetes mellitus without complications: Secondary | ICD-10-CM | POA: Diagnosis not present

## 2014-04-18 DIAGNOSIS — D649 Anemia, unspecified: Secondary | ICD-10-CM | POA: Diagnosis not present

## 2014-04-18 MED ORDER — BUSPIRONE HCL 15 MG PO TABS: 15.0000 mg | ORAL_TABLET | Freq: Three times a day (TID) | ORAL | Status: DC

## 2014-04-18 MED ORDER — ALPRAZOLAM 0.5 MG PO TABS: 0.5000 mg | ORAL_TABLET | Freq: Three times a day (TID) | ORAL | Status: DC | PRN

## 2014-04-18 NOTE — Patient Instructions (Signed)

## 2014-04-18 NOTE — Progress Notes (Signed)
Pre visit review using our clinic review tool, if applicable. No additional management support is needed unless otherwise documented below in the visit note. 

## 2014-04-20 NOTE — Assessment & Plan Note (Signed)
Pt finished abx  Due for xray in 2-3 weeks Symptoms have resolved

## 2014-04-20 NOTE — Progress Notes (Addendum)
Subjective:    Patient ID: Erin [name], female    DOB: May 19, 1945, 69 y.o.   MRN: [id number]  HPI   Pt here for hosp f/u pneumonia.  She is feeling almost completely better but her anxiety and  Stress levels have increased.  Pt d/c 04/12/2014.  hosp records were reviewed.    Past Medical History  Diagnosis Date  . Depression   . Hyperlipidemia   . Chronic kidney disease   . CHF (congestive heart failure)     hospital 11/2013-- [place]   History   Social History  . Marital Status: Widowed    Spouse Name: N/A    Number of Children: N/A  . Years of Education: N/A   Occupational History  . 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 Smoking status: Former Smoker -- 1.50 packs/day for 20 years    Types: Cigarettes    Quit date: 08/02/1984  . Smokeless tobacco: Never Used  . Alcohol Use: No  . Drug Use: No  . Sexual Activity:    Partners: Male   Other Topics Concern  . Not on file   Social History Narrative   NO REG EXERCISE   Caffeine use: daily   Current Outpatient Prescriptions  Medication Sig Dispense Refill  . albuterol (PROVENTIL) (2.5 MG/3ML) 0.083% nebulizer solution Take 2.5 mg by nebulization every 6 (six) hours as needed for wheezing or shortness of breath.    . ALPRAZolam (XANAX) 0.5 MG tablet Take 1 tablet (0.5 mg total) by mouth 3 (three) times daily as needed for anxiety or sleep. 60 tablet 0  . atorvastatin (LIPITOR) 40 MG tablet Take 1 tablet (40 mg total) by mouth daily. 30 tablet 5  . carvedilol (COREG) 3.125 MG tablet Take 1 tablet (3.125 mg total) by mouth every 12 (twelve) hours. 60 tablet 5  . fenofibrate 160 MG tablet Take 1 tablet (160 mg total) by mouth daily. 90 tablet 1  . Fluticasone-Salmeterol (ADVAIR DISKUS) 250-50 MCG/DOSE AEPB Inhale 1 puff into the lungs 2 (two) times daily. 1 each 3  . furosemide (LASIX) 20 MG tablet Take 2 tablets by mouth daily.    Marland Kitchen glipiZIDE (GLUCOTROL) 5 MG tablet TAKE TWO TABLETS BY MOUTH  TWICE DAILY AS DIRECTED 60 tablet 2  . hydrALAZINE (APRESOLINE) 50 MG tablet Take 1 tablet (50 mg total) by mouth 3 (three) times daily. (Patient taking differently: Take 100 mg by mouth 3 (three) times daily. ) 90 tablet 5  . HYDROcodone-acetaminophen (NORCO) 10-325 MG per tablet Take 1 tablet by mouth every 8 (eight) hours as needed.    . isosorbide mononitrate (IMDUR) 30 MG 24 hr tablet Take 1 tablet (30 mg total) by mouth daily. 30 tablet 5  . levofloxacin (LEVAQUIN) 500 MG tablet Take 500 mg by mouth daily. For 5 days    . pantoprazole (PROTONIX) 40 MG tablet Take 1 tablet (40 mg total) by mouth daily. (Patient taking differently: Take 40 mg by mouth 2 (two) times daily. ) 30 tablet 11  . pramipexole (MIRAPEX) 1 MG tablet TAKE ONE TABLET BY MOUTH AT BEDTIME 30 tablet 5  . sertraline (ZOLOFT) 50 MG tablet Take 1 tablet (50 mg total) by mouth daily. (Patient taking differently: Take 75 mg by mouth daily. ) 90 tablet 3  . TRADJENTA 5 MG TABS tablet TAKE ONE TABLET BY MOUTH ONCE DAILY 30 tablet 0  . amLODipine (NORVASC) 10 MG tablet Take 1 tablet (10 mg total) by  mouth daily. (Patient not taking: Reported on 04/18/2014) 90 tablet 1  . busPIRone (BUSPAR) 15 MG tablet Take 1 tablet (15 mg total) by mouth 3 (three) times daily. 60 tablet 5   No current facility-administered medications for this visit.   Family History  Problem Relation Age of Onset  . Hypertension Mother   . Alzheimer's disease Mother   . Hyperlipidemia Mother   . Heart disease Mother     cad  . Asthma Father   . Cancer Father 21    leukemia  . Stroke Father   . Hypertension Father   . Heart disease Sister 19    MI  . Arthritis Brother   . Heart disease Maternal Uncle   . Stroke Maternal Uncle   . Uterine cancer Paternal Grandmother   . Stomach cancer Paternal Grandfather   . Heart disease Maternal Uncle   . Heart disease Maternal Uncle     Review of Systems  Constitutional: Negative for fever, chills, diaphoresis,  activity change, appetite change, fatigue and unexpected weight change.  Respiratory: Negative for apnea, cough, choking, chest tightness, shortness of breath, wheezing and stridor.   Cardiovascular: Negative for chest pain, palpitations and leg swelling.  Psychiatric/Behavioral: Negative for suicidal ideas, hallucinations, behavioral problems, confusion, sleep disturbance, self-injury, dysphoric mood, decreased concentration and agitation. The patient is nervous/anxious. The patient is not hyperactive.        Objective:   Physical Exam BP 172/68 mmHg  Pulse 80  Temp(Src) 97.9 F (36.6 C) (Oral)  Wt 249 lb 6.4 oz (113.127 kg)  SpO2 94% General appearance: alert, cooperative, appears stated age and no distress Nose: Nares normal. Septum midline. Mucosa normal. No drainage or sinus tenderness. Throat: lips, mucosa, and tongue normal; teeth and gums normal Neck: no adenopathy, no carotid bruit, no JVD, supple, symmetrical, trachea midline and thyroid not enlarged, symmetric, no tenderness/mass/nodules Lungs: clear to auscultation bilaterally Heart: S1, S2 normal Extremities: extremities normal, atraumatic, no cyanosis or worsening edema  Transthoracic Echocardiography  Patient:  Erin [name] MR #:    [id number] Study Date: 03/25/2014 Gender:   F Age:    28 Height:   162.6 cm Weight:   113.8 kg BSA:    2.33 m^2 Pt. Status: Room:  ATTENDING  Ermalene Searing REFERRING  Garnet Koyanagi R SONOGRAPHER Melissa Morford, RDCS PERFORMING  [facility], Outpatient  cc:  ------------------------------------------------------------------- LV EF: 55% -  60%  ------------------------------------------------------------------- Indications:   Dyspnea 786.09.  ------------------------------------------------------------------- History:  PMH:  Congestive heart failure. Chronic obstructive pulmonary disease. Risk factors:  Hypertension. Dyslipidemia.  ------------------------------------------------------------------- Study Conclusions  - Left ventricle: The cavity size was normal. Systolic function was normal. The estimated ejection fraction was in the range of 55% to 60%. Wall motion was normal; there were no regional wall motion abnormalities. Left ventricular diastolic function parameters were normal. - Mitral valve: Calcified annulus. - Left atrium: The atrium was mildly dilated. - Atrial septum: A patent foramen ovale cannot be excluded.  Transthoracic echocardiography. M-mode, complete 2D, spectral Doppler, and color Doppler. Birthdate: Patient birthdate: 07-20-1945. Age: Patient is 69 yr old. Sex: Gender: female. BMI: 43 kg/m^2. Blood pressure:   130/59 Patient status: Outpatient. Study date: Study date: 03/25/2014. Study time: 10:59 AM. Location: Echo laboratory.  -------------------------------------------------------------------  ------------------------------------------------------------------- Left ventricle: The cavity size was normal. Systolic function was normal. The estimated ejection fraction was in the range of 55% to 60%. Wall motion was normal; there were no regional wall motion  abnormalities. The transmitral flow pattern was normal. The deceleration time of the early transmitral flow velocity was normal. The pulmonary vein flow pattern was normal. The tissue Doppler parameters were normal. Left ventricular diastolic function parameters were normal.     Assessment & Plan:  1. Anxiety [employer] buspar to 15 mg and con't xanax  - ALPRAZolam (XANAX) 0.5 MG tablet; Take 1 tablet (0.5 mg total) by mouth 3 (three) times daily as needed for anxiety or sleep.  Dispense: 60 tablet; Refill: 0 - busPIRone (BUSPAR) 15 MG tablet; Take 1 tablet (15 mg total) by mouth 3 (three) times daily.  Dispense: 60 tablet; Refill: 5  2. Insomnia   - ALPRAZolam (XANAX) 0.5 MG  tablet; Take 1 tablet (0.5 mg total) by mouth 3 (three) times daily as needed for anxiety or sleep.  Dispense: 60 tablet; Refill: 0   3 Acute on chronic systolic congestive heart failure con't coreg - Ambulatory referral to Cardiology  4. CAP (community acquired pneumonia) abx finished and symptoms resolved Recheck cxr - DG Chest 2 View; Future  5 OSA (obstructive sleep apnea) On cpap

## 2014-04-23 DIAGNOSIS — I10 Essential (primary) hypertension: Secondary | ICD-10-CM | POA: Diagnosis not present

## 2014-04-23 DIAGNOSIS — I5031 Acute diastolic (congestive) heart failure: Secondary | ICD-10-CM | POA: Diagnosis not present

## 2014-04-23 DIAGNOSIS — E119 Type 2 diabetes mellitus without complications: Secondary | ICD-10-CM | POA: Diagnosis not present

## 2014-04-23 DIAGNOSIS — F329 Major depressive disorder, single episode, unspecified: Secondary | ICD-10-CM | POA: Diagnosis not present

## 2014-04-23 DIAGNOSIS — J189 Pneumonia, unspecified organism: Secondary | ICD-10-CM | POA: Diagnosis not present

## 2014-04-23 DIAGNOSIS — D649 Anemia, unspecified: Secondary | ICD-10-CM | POA: Diagnosis not present

## 2014-04-24 DIAGNOSIS — I5031 Acute diastolic (congestive) heart failure: Secondary | ICD-10-CM | POA: Diagnosis not present

## 2014-04-24 DIAGNOSIS — D649 Anemia, unspecified: Secondary | ICD-10-CM | POA: Diagnosis not present

## 2014-04-24 DIAGNOSIS — F329 Major depressive disorder, single episode, unspecified: Secondary | ICD-10-CM | POA: Diagnosis not present

## 2014-04-24 DIAGNOSIS — F411 Generalized anxiety disorder: Secondary | ICD-10-CM | POA: Diagnosis not present

## 2014-04-24 DIAGNOSIS — G4733 Obstructive sleep apnea (adult) (pediatric): Secondary | ICD-10-CM | POA: Diagnosis not present

## 2014-04-24 DIAGNOSIS — I10 Essential (primary) hypertension: Secondary | ICD-10-CM | POA: Diagnosis not present

## 2014-04-24 DIAGNOSIS — J189 Pneumonia, unspecified organism: Secondary | ICD-10-CM | POA: Diagnosis not present

## 2014-04-24 DIAGNOSIS — E119 Type 2 diabetes mellitus without complications: Secondary | ICD-10-CM | POA: Diagnosis not present

## 2014-04-24 DIAGNOSIS — R0600 Dyspnea, unspecified: Secondary | ICD-10-CM | POA: Diagnosis not present

## 2014-04-25 DIAGNOSIS — E119 Type 2 diabetes mellitus without complications: Secondary | ICD-10-CM | POA: Diagnosis not present

## 2014-04-25 DIAGNOSIS — J189 Pneumonia, unspecified organism: Secondary | ICD-10-CM | POA: Diagnosis not present

## 2014-04-25 DIAGNOSIS — F329 Major depressive disorder, single episode, unspecified: Secondary | ICD-10-CM | POA: Diagnosis not present

## 2014-04-25 DIAGNOSIS — D649 Anemia, unspecified: Secondary | ICD-10-CM | POA: Diagnosis not present

## 2014-04-25 DIAGNOSIS — I10 Essential (primary) hypertension: Secondary | ICD-10-CM | POA: Diagnosis not present

## 2014-04-25 DIAGNOSIS — I5031 Acute diastolic (congestive) heart failure: Secondary | ICD-10-CM | POA: Diagnosis not present

## 2014-04-25 NOTE — Addendum Note (Signed)
Addended by: Rosalita Chessman on: 04/25/2014 04:25 PM   Modules accepted: Level of Service

## 2014-04-29 ENCOUNTER — Telehealth: Payer: Self-pay | Admitting: Cardiology

## 2014-04-29 DIAGNOSIS — F329 Major depressive disorder, single episode, unspecified: Secondary | ICD-10-CM | POA: Diagnosis not present

## 2014-04-29 DIAGNOSIS — J189 Pneumonia, unspecified organism: Secondary | ICD-10-CM | POA: Diagnosis not present

## 2014-04-29 DIAGNOSIS — E119 Type 2 diabetes mellitus without complications: Secondary | ICD-10-CM | POA: Diagnosis not present

## 2014-04-29 DIAGNOSIS — I10 Essential (primary) hypertension: Secondary | ICD-10-CM | POA: Diagnosis not present

## 2014-04-29 DIAGNOSIS — I5031 Acute diastolic (congestive) heart failure: Secondary | ICD-10-CM | POA: Diagnosis not present

## 2014-04-29 DIAGNOSIS — D649 Anemia, unspecified: Secondary | ICD-10-CM | POA: Diagnosis not present

## 2014-04-29 NOTE — Telephone Encounter (Signed)
04/29/2014 Received paperwork from [facility] on patient for upcoming appointment with Dr. Stanford Breed. Paperwork given to [employer]. cbr

## 2014-05-02 ENCOUNTER — Ambulatory Visit (HOSPITAL_BASED_OUTPATIENT_CLINIC_OR_DEPARTMENT_OTHER)
Admission: RE | Admit: 2014-05-02 | Discharge: 2014-05-02 | Disposition: A | Discharge status: Home / Self Care | Payer: Medicare Other | Source: Ambulatory Visit | Attending: Family Medicine | Admitting: Family Medicine

## 2014-05-02 DIAGNOSIS — J189 Pneumonia, unspecified organism: Secondary | ICD-10-CM | POA: Diagnosis not present

## 2014-05-02 DIAGNOSIS — I509 Heart failure, unspecified: Secondary | ICD-10-CM | POA: Diagnosis not present

## 2014-05-03 DIAGNOSIS — J189 Pneumonia, unspecified organism: Secondary | ICD-10-CM | POA: Diagnosis not present

## 2014-05-03 DIAGNOSIS — I5031 Acute diastolic (congestive) heart failure: Secondary | ICD-10-CM | POA: Diagnosis not present

## 2014-05-03 DIAGNOSIS — I10 Essential (primary) hypertension: Secondary | ICD-10-CM | POA: Diagnosis not present

## 2014-05-03 DIAGNOSIS — F329 Major depressive disorder, single episode, unspecified: Secondary | ICD-10-CM | POA: Diagnosis not present

## 2014-05-03 DIAGNOSIS — D649 Anemia, unspecified: Secondary | ICD-10-CM | POA: Diagnosis not present

## 2014-05-03 DIAGNOSIS — E119 Type 2 diabetes mellitus without complications: Secondary | ICD-10-CM | POA: Diagnosis not present

## 2014-05-07 ENCOUNTER — Other Ambulatory Visit: Payer: Self-pay

## 2014-05-07 DIAGNOSIS — I10 Essential (primary) hypertension: Secondary | ICD-10-CM | POA: Diagnosis not present

## 2014-05-07 DIAGNOSIS — I5031 Acute diastolic (congestive) heart failure: Secondary | ICD-10-CM | POA: Diagnosis not present

## 2014-05-07 DIAGNOSIS — J189 Pneumonia, unspecified organism: Secondary | ICD-10-CM | POA: Diagnosis not present

## 2014-05-07 DIAGNOSIS — F329 Major depressive disorder, single episode, unspecified: Secondary | ICD-10-CM | POA: Diagnosis not present

## 2014-05-07 DIAGNOSIS — D649 Anemia, unspecified: Secondary | ICD-10-CM | POA: Diagnosis not present

## 2014-05-07 DIAGNOSIS — E119 Type 2 diabetes mellitus without complications: Secondary | ICD-10-CM | POA: Diagnosis not present

## 2014-05-07 MED ORDER — HYDRALAZINE HCL 50 MG PO TABS: 50.0000 mg | ORAL_TABLET | Freq: Three times a day (TID) | ORAL | Status: DC

## 2014-05-07 NOTE — Telephone Encounter (Signed)
Rf requested for Hydralazine 100 1 tab by mouth three times daily #90  This is not what is listed on the system. Please advise if refill appropriate.     KP

## 2014-05-15 ENCOUNTER — Ambulatory Visit (INDEPENDENT_AMBULATORY_CARE_PROVIDER_SITE_OTHER): Payer: Medicare Other | Admitting: Cardiology

## 2014-05-15 ENCOUNTER — Encounter: Payer: Self-pay | Admitting: Cardiology

## 2014-05-15 VITALS — BP 160/68 | HR 94 | Ht 64.0 in | Wt 242.0 lb

## 2014-05-15 DIAGNOSIS — I10 Essential (primary) hypertension: Secondary | ICD-10-CM

## 2014-05-15 DIAGNOSIS — I5032 Chronic diastolic (congestive) heart failure: Secondary | ICD-10-CM

## 2014-05-15 DIAGNOSIS — I509 Heart failure, unspecified: Secondary | ICD-10-CM | POA: Diagnosis not present

## 2014-05-15 DIAGNOSIS — R0609 Other forms of dyspnea: Secondary | ICD-10-CM

## 2014-05-15 DIAGNOSIS — R0600 Dyspnea, unspecified: Secondary | ICD-10-CM

## 2014-05-15 MED ORDER — CARVEDILOL 6.25 MG PO TABS: 6.2500 mg | ORAL_TABLET | Freq: Two times a day (BID) | ORAL | Status: DC

## 2014-05-15 MED ORDER — FUROSEMIDE 20 MG PO TABS: 60.0000 mg | ORAL_TABLET | Freq: Every day | ORAL | Status: DC

## 2014-05-15 NOTE — Assessment & Plan Note (Signed)
Blood pressure elevated.increase carvedilol to 6.25 mg by mouth twice a day. Titrate medications based on follow-up readings.

## 2014-05-15 NOTE — Assessment & Plan Note (Signed)
Patient with history of presumed diastolic congestive heart failure. Her LV systolic function is normal. Volume status difficult to assess. I will change Lasix to 60 mg daily to see if her symptoms improve. Follow renal function closely given history of renal insufficiency. Check potassium and renal function in 1 week. There also is most likely a contribution from deconditioning and obesity hypoventilation syndrome with her dyspnea. If symptoms persist we can consider right heart catheterization.

## 2014-05-15 NOTE — Assessment & Plan Note (Signed)
Needs weight loss. 

## 2014-05-15 NOTE — Assessment & Plan Note (Signed)
Continue statin. 

## 2014-05-15 NOTE — Assessment & Plan Note (Signed)
Given history of diabetes mellitus we will plan a nuclear study to screen for ischemia as a contributor to her dyspnea.

## 2014-05-15 NOTE — Patient Instructions (Signed)
Your physician recommends that you schedule a follow-up appointment in: [place] has requested that you have a lexiscan myoview. For further information please visit [email or web address]. Please follow instruction sheet, as given.   INCREASE FUROSEMIDE TO 60 MG ONCE DAILY= 3 OF THE 20 MG TABLETS ONCE DAILY  Your physician recommends that you return for lab work in: [place] CARVEDILOL TO 6.25 MG TWICE DAILY=2 OF THE 3.125 MG TABLETS TWICE DAILY

## 2014-05-15 NOTE — Progress Notes (Signed)
HPI: 69 year old female for evaluation of dyspnea/CHF. Patient was admitted in [facility] in August 2015 with malignant hypertension, dyspnea and chest pain. She had acute renal failure. Cardiac markers were negative. Echocardiogram showed ejection fraction 55-60% and mild pulmonary hypertension. VQ scan negative. Echocardiogram December 2015 showed normal LV function and mild left atrial enlargement. Chest x-ray January 2016 showed low-grade compensated congestive heart failure. Laboratories January 2016 showed BUN 36, creatinine 1.3 and normal BNP. Patient complains of dyspnea on exertion. No orthopnea or PND. She does occasionally have pedal edema. She occasionally has chest pain that lasts several minutes that is not exertional. Cardiology asked to evaluate.  Current Outpatient Prescriptions  Medication Sig Dispense Refill  . albuterol (PROVENTIL) (2.5 MG/3ML) 0.083% nebulizer solution Take 2.5 mg by nebulization every 6 (six) hours as needed for wheezing or shortness of breath.    Marland Kitchen atorvastatin (LIPITOR) 40 MG tablet Take 1 tablet (40 mg total) by mouth daily. 30 tablet 5  . busPIRone (BUSPAR) 15 MG tablet Take 1 tablet (15 mg total) by mouth 3 (three) times daily. 60 tablet 5  . carvedilol (COREG) 3.125 MG tablet Take 1 tablet (3.125 mg total) by mouth every 12 (twelve) hours. 60 tablet 5  . ergocalciferol (VITAMIN D2) 50000 UNITS capsule Take 50,000 Units by mouth once a week.    . fenofibrate 160 MG tablet Take 1 tablet (160 mg total) by mouth daily. 90 tablet 1  . Ferrous Sulfate (IRON) 325 (65 FE) MG TABS Take 1 tablet by mouth 2 (two) times daily.    . Fluticasone-Salmeterol (ADVAIR DISKUS) 250-50 MCG/DOSE AEPB Inhale 1 puff into the lungs 2 (two) times daily. 1 each 3  . furosemide (LASIX) 20 MG tablet Take 2 tablets by mouth daily.    Marland Kitchen glipiZIDE (GLUCOTROL) 5 MG tablet TAKE TWO TABLETS BY MOUTH TWICE DAILY AS DIRECTED 60 tablet 2  . GuaiFENesin (MUCINEX PO) Take 1 tablet by mouth  2 (two) times daily.    . hydrALAZINE (APRESOLINE) 50 MG tablet Take 1 tablet (50 mg total) by mouth 3 (three) times daily. 90 tablet 5  . isosorbide mononitrate (IMDUR) 30 MG 24 hr tablet Take 1 tablet (30 mg total) by mouth daily. 30 tablet 5  . pantoprazole (PROTONIX) 40 MG tablet Take 1 tablet (40 mg total) by mouth daily. (Patient taking differently: Take 40 mg by mouth 2 (two) times daily. ) 30 tablet 11  . pramipexole (MIRAPEX) 1 MG tablet TAKE ONE TABLET BY MOUTH AT BEDTIME 30 tablet 5  . sertraline (ZOLOFT) 50 MG tablet Take 1 tablet (50 mg total) by mouth daily. (Patient taking differently: Take 75 mg by mouth daily. ) 90 tablet 3  . TRADJENTA 5 MG TABS tablet TAKE ONE TABLET BY MOUTH ONCE DAILY 30 tablet 0   No current facility-administered medications for this visit.    No Known Allergies   Past Medical History  Diagnosis Date  . Depression   . Hyperlipidemia   . Chronic kidney disease   . CHF (congestive heart failure)     hospital 11/2013-- [place]  . Diabetes mellitus   . Hypertension   . Asthma   . GERD (gastroesophageal reflux disease)     Past Surgical History  Procedure Laterality Date  . Cholecystectomy  2002  . Tubal ligation    . Tonsillectomy    . Spine surgery  aug 2015    [facility]    History   Social History  .  Marital Status: Widowed    Spouse Name: N/A  . Number of Children: 4  . Years of Education: N/A   Occupational History  . 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 Smoking status: Former Smoker -- 1.50 packs/day for 20 years    Types: Cigarettes    Quit date: 08/02/1984  . Smokeless tobacco: Never Used  . Alcohol Use: No  . Drug Use: No  . Sexual Activity:    Partners: Male   Other Topics Concern  . Not on file   Social History Narrative   NO REG EXERCISE   Caffeine use: daily    Family History  Problem Relation Age of Onset  . Hypertension Mother   . Alzheimer's disease Mother   .  Hyperlipidemia Mother   . Heart disease Father     cad  . Asthma Father   . Cancer Father 17    leukemia  . Stroke Father   . Hypertension Father   . Heart disease Sister 30    MI  . Arthritis Brother   . Heart disease Maternal Uncle   . Stroke Maternal Uncle   . Uterine cancer Paternal Grandmother   . Stomach cancer Paternal Grandfather   . Heart disease Maternal Uncle   . Heart disease Maternal Uncle     ROS: cough productive of white sputum but no hemoptysis, dysphasia, odynophagia, melena, hematochezia, dysuria, hematuria, rash, seizure activity, orthopnea, PND, claudication. Remaining systems are negative.  Physical Exam:   Blood pressure 160/68, pulse 94, height 5\' 4"  (1.626 m), weight 242 lb (109.77 kg).  General:  Well developed/obese in NAD Skin warm/dry Patient not depressed No peripheral clubbing Back-normal HEENT-normal/normal eyelids Neck supple/normal carotid upstroke bilaterally; no bruits; no JVD; no thyromegaly chest - CTA/ normal expansion CV - RRR/normal S1 and S2; no rubs or gallops;  PMI nondisplaced, 2/6 SEM Abdomen -NT/ND, no HSM, no mass, + bowel sounds, no bruit 2+ femoral pulses, no bruits Ext-trace edema, no chords, 2+ DP Neuro-grossly nonfocal  ECG normal sinus rhythm at a rate of 94. Left anterior fascicular block. Anterior infarct.

## 2014-05-21 ENCOUNTER — Telehealth (HOSPITAL_COMMUNITY): Payer: Self-pay

## 2014-05-21 NOTE — Telephone Encounter (Signed)
Encounter complete. 

## 2014-05-24 ENCOUNTER — Encounter: Payer: Self-pay | Admitting: Family Medicine

## 2014-05-24 ENCOUNTER — Ambulatory Visit (INDEPENDENT_AMBULATORY_CARE_PROVIDER_SITE_OTHER): Payer: Medicare Other | Admitting: Family Medicine

## 2014-05-24 ENCOUNTER — Ambulatory Visit (HOSPITAL_BASED_OUTPATIENT_CLINIC_OR_DEPARTMENT_OTHER)
Admission: RE | Admit: 2014-05-24 | Discharge: 2014-05-24 | Disposition: A | Discharge status: Home / Self Care | Payer: Medicare Other | Source: Ambulatory Visit | Attending: Cardiology | Admitting: Cardiology

## 2014-05-24 ENCOUNTER — Ambulatory Visit (HOSPITAL_BASED_OUTPATIENT_CLINIC_OR_DEPARTMENT_OTHER)
Admission: RE | Admit: 2014-05-24 | Discharge: 2014-05-24 | Disposition: A | Discharge status: Home / Self Care | Payer: Medicare Other | Source: Ambulatory Visit | Attending: Family Medicine | Admitting: Family Medicine

## 2014-05-24 VITALS — BP 140/60 | HR 79 | Temp 97.8°F | Wt 255.8 lb

## 2014-05-24 DIAGNOSIS — Z8701 Personal history of pneumonia (recurrent): Secondary | ICD-10-CM

## 2014-05-24 DIAGNOSIS — R0602 Shortness of breath: Secondary | ICD-10-CM

## 2014-05-24 DIAGNOSIS — R05 Cough: Secondary | ICD-10-CM | POA: Diagnosis not present

## 2014-05-24 DIAGNOSIS — I509 Heart failure, unspecified: Secondary | ICD-10-CM

## 2014-05-24 DIAGNOSIS — R918 Other nonspecific abnormal finding of lung field: Secondary | ICD-10-CM | POA: Diagnosis not present

## 2014-05-24 DIAGNOSIS — J189 Pneumonia, unspecified organism: Secondary | ICD-10-CM

## 2014-05-24 LAB — BASIC METABOLIC PANEL
BUN: 34 mg/dL — ABNORMAL HIGH (ref 6–23)
CO2: 26 mEq/L (ref 19–32)
Calcium: 9.1 mg/dL (ref 8.4–10.5)
Chloride: 104 mEq/L (ref 96–112)
Creatinine, Ser: 1.38 mg/dL — ABNORMAL HIGH (ref 0.40–1.20)
GFR: 40.28 mL/min — ABNORMAL LOW (ref >60.00)
Glucose, Bld: 95 mg/dL (ref 70–99)
Potassium: 3.8 mEq/L (ref 3.5–5.1)
Sodium: 138 mEq/L (ref 135–145)

## 2014-05-24 LAB — CBC WITH DIFFERENTIAL/PLATELET
Basophils Absolute: 0 10*3/uL (ref 0.0–0.1)
Basophils Relative: 0.4 % (ref 0.0–3.0)
Eosinophils Absolute: 0.1 10*3/uL (ref 0.0–0.7)
Eosinophils Relative: 1.2 % (ref 0.0–5.0)
HCT: 28.8 % — ABNORMAL LOW (ref 36.0–46.0)
Hemoglobin: 9.6 g/dL — ABNORMAL LOW (ref 12.0–15.0)
Lymphocytes Relative: 13.4 % (ref 12.0–46.0)
Lymphs Abs: 0.7 10*3/uL (ref 0.7–4.0)
MCHC: 33.2 g/dL (ref 30.0–36.0)
MCV: 78.6 fl (ref 78.0–100.0)
Monocytes Absolute: 0.5 10*3/uL (ref 0.1–1.0)
Monocytes Relative: 10.1 % (ref 3.0–12.0)
Neutro Abs: 3.9 10*3/uL (ref 1.4–7.7)
Neutrophils Relative %: 74.9 % (ref 43.0–77.0)
Platelets: 200 10*3/uL (ref 150.0–400.0)
RBC: 3.66 Mil/uL — ABNORMAL LOW (ref 3.87–5.11)
RDW: 23.7 % — ABNORMAL HIGH (ref 11.5–15.5)
WBC: 5.2 10*3/uL (ref 4.0–10.5)

## 2014-05-24 LAB — HEPATIC FUNCTION PANEL
ALT: 24 U/L (ref 0–35)
AST: 28 U/L (ref 0–37)
Albumin: 3.8 g/dL (ref 3.5–5.2)
Alkaline Phosphatase: 56 U/L (ref 39–117)
Bilirubin, Direct: 0 mg/dL (ref 0.0–0.3)
Total Bilirubin: 0.4 mg/dL (ref 0.2–1.2)
Total Protein: 6.7 g/dL (ref 6.0–8.3)

## 2014-05-24 MED ORDER — AZITHROMYCIN 250 MG PO TABS: ORAL_TABLET | ORAL | Status: DC

## 2014-05-24 NOTE — Progress Notes (Signed)
Subjective:    Patient ID: Erin [name], female    DOB: April 28, 1945, 69 y.o.   MRN: [id number]  HPI  Patient here c/o increasing sob.  She is worried her pneumonia is coming back.  Past Medical History  Diagnosis Date  . Depression   . Hyperlipidemia   . Chronic kidney disease   . CHF (congestive heart failure)     hospital 11/2013-- [place]  . Diabetes mellitus   . Hypertension   . Asthma   . GERD (gastroesophageal reflux disease)     Review of Systems  Constitutional: Positive for chills and fatigue. Negative for fever, activity change, appetite change and unexpected weight change.  HENT: Positive for congestion. Negative for ear pain.   Respiratory: Positive for cough, chest tightness and shortness of breath.   Cardiovascular: Positive for leg swelling. Negative for chest pain and palpitations.  Psychiatric/Behavioral: Negative for behavioral problems and dysphoric mood. The patient is not nervous/anxious.        Objective:    Physical Exam  Constitutional: She is oriented to person, place, and time. She appears well-developed and well-nourished. No distress.  HENT:  Right Ear: External ear normal.  Left Ear: External ear normal.  Nose: Nose normal.  Mouth/Throat: Oropharynx is clear and moist.  Eyes: EOM are normal. Pupils are equal, round, and reactive to light.  Neck: Normal range of motion. Neck supple.  Cardiovascular: Normal rate, regular rhythm and normal heart sounds.   No murmur heard. Pulmonary/Chest: Effort normal. No respiratory distress. She has wheezes. She has no rales. She exhibits no tenderness.  Musculoskeletal: She exhibits edema.  Neurological: She is alert and oriented to person, place, and time.  Psychiatric: She has a normal mood and affect. Her behavior is normal. Judgment and thought content normal.    BP 140/60 mmHg  Pulse 79  Temp(Src) 97.8 F (36.6 C) (Oral)  Wt 255 lb 12.8 oz (116.03 kg)  SpO2 95% Wt Readings from Last 3  Encounters:  05/24/14 255 lb 12.8 oz (116.03 kg)  05/15/14 242 lb (109.77 kg)  04/18/14 249 lb 6.4 oz (113.127 kg)     Lab Results  Component Value Date   WBC 5.2 05/24/2014   HGB 9.6* 05/24/2014   HCT 28.8* 05/24/2014   PLT 200.0 05/24/2014   GLUCOSE 95 05/24/2014   CHOL 136 02/05/2014   TRIG 111.0 02/05/2014   HDL 47.30 02/05/2014   LDLDIRECT 79.2 01/17/2013   LDLCALC 67 02/05/2014   ALT 24 05/24/2014   AST 28 05/24/2014   NA 138 05/24/2014   K 3.8 05/24/2014   CL 104 05/24/2014   CREATININE 1.38* 05/24/2014   BUN 34* 05/24/2014   CO2 26 05/24/2014   TSH 2.29 05/17/2011   HGBA1C 5.8 01/11/2014   MICROALBUR 1.5 02/05/2014    No results found.     Assessment & Plan:   Problem List Items Addressed This Visit    History of bacterial pneumonia    z pack x1 cxr today Check labs       Other Visit Diagnoses    SOB (shortness of breath)    -  Primary    Relevant Orders    DG Chest 2 View (Completed)    Basic metabolic panel (Completed)    CBC with Differential/Platelet (Completed)    Hepatic function panel (Completed)    POCT urinalysis dipstick    Pneumonia, organism unspecified        Relevant Medications    azithromycin (ZITHROMAX)  tablet    Other Relevant Orders    DG Chest 2 View (Completed)    Basic metabolic panel (Completed)    CBC with Differential/Platelet (Completed)    Hepatic function panel (Completed)    POCT urinalysis dipstick        Garnet Koyanagi, DO

## 2014-05-24 NOTE — Patient Instructions (Signed)

## 2014-05-24 NOTE — Progress Notes (Signed)
Pre visit review using our clinic review tool, if applicable. No additional management support is needed unless otherwise documented below in the visit note. 

## 2014-05-24 NOTE — Assessment & Plan Note (Signed)
z pack x1 cxr today Check labs

## 2014-06-03 ENCOUNTER — Telehealth: Payer: Self-pay | Admitting: Cardiology

## 2014-06-03 NOTE — Telephone Encounter (Signed)
Received records from [facility] for appointment on 07/10/14 with Dr Stanford Breed (at [facility])  Records given to [facility] (medical records) for Dr Jacalyn Lefevre schedule on 07/10/14.  lp

## 2014-06-04 DIAGNOSIS — M47896 Other spondylosis, lumbar region: Secondary | ICD-10-CM | POA: Diagnosis not present

## 2014-06-04 DIAGNOSIS — M4180 Other forms of scoliosis, site unspecified: Secondary | ICD-10-CM | POA: Diagnosis not present

## 2014-06-06 ENCOUNTER — Telehealth (HOSPITAL_COMMUNITY): Payer: Self-pay

## 2014-06-06 DIAGNOSIS — G4733 Obstructive sleep apnea (adult) (pediatric): Secondary | ICD-10-CM | POA: Diagnosis not present

## 2014-06-06 DIAGNOSIS — J310 Chronic rhinitis: Secondary | ICD-10-CM | POA: Diagnosis not present

## 2014-06-06 DIAGNOSIS — R5383 Other fatigue: Secondary | ICD-10-CM | POA: Diagnosis not present

## 2014-06-06 DIAGNOSIS — R0600 Dyspnea, unspecified: Secondary | ICD-10-CM | POA: Diagnosis not present

## 2014-06-06 NOTE — Telephone Encounter (Signed)
Encounter complete. 

## 2014-06-07 ENCOUNTER — Ambulatory Visit (INDEPENDENT_AMBULATORY_CARE_PROVIDER_SITE_OTHER): Payer: Medicare Other | Admitting: Family Medicine

## 2014-06-07 ENCOUNTER — Encounter: Payer: Self-pay | Admitting: Family Medicine

## 2014-06-07 VITALS — BP 136/56 | HR 80 | Temp 98.0°F | Wt 256.4 lb

## 2014-06-07 DIAGNOSIS — R609 Edema, unspecified: Secondary | ICD-10-CM

## 2014-06-07 DIAGNOSIS — J189 Pneumonia, unspecified organism: Secondary | ICD-10-CM | POA: Diagnosis not present

## 2014-06-07 DIAGNOSIS — D509 Iron deficiency anemia, unspecified: Secondary | ICD-10-CM | POA: Diagnosis not present

## 2014-06-07 LAB — CBC WITH DIFFERENTIAL/PLATELET
Basophils Absolute: 0 10*3/uL (ref 0.0–0.1)
Basophils Relative: 0.5 % (ref 0.0–3.0)
Eosinophils Absolute: 0.1 10*3/uL (ref 0.0–0.7)
Eosinophils Relative: 0.9 % (ref 0.0–5.0)
HCT: 30.1 % — ABNORMAL LOW (ref 36.0–46.0)
Hemoglobin: 9.9 g/dL — ABNORMAL LOW (ref 12.0–15.0)
Lymphocytes Relative: 13.4 % (ref 12.0–46.0)
Lymphs Abs: 0.8 10*3/uL (ref 0.7–4.0)
MCHC: 32.9 g/dL (ref 30.0–36.0)
MCV: 79.4 fl (ref 78.0–100.0)
Monocytes Absolute: 0.4 10*3/uL (ref 0.1–1.0)
Monocytes Relative: 6.7 % (ref 3.0–12.0)
Neutro Abs: 5 10*3/uL (ref 1.4–7.7)
Neutrophils Relative %: 78.5 % — ABNORMAL HIGH (ref 43.0–77.0)
Platelets: 232 10*3/uL (ref 150.0–400.0)
RBC: 3.79 Mil/uL — ABNORMAL LOW (ref 3.87–5.11)
RDW: 22.6 % — ABNORMAL HIGH (ref 11.5–15.5)
WBC: 6.3 10*3/uL (ref 4.0–10.5)

## 2014-06-07 LAB — IBC PANEL
Iron: 80 ug/dL (ref 42–145)
Saturation Ratios: 18.6 % — ABNORMAL LOW (ref 20.0–50.0)
Transferrin: 308 mg/dL (ref 212.0–360.0)

## 2014-06-07 NOTE — Progress Notes (Signed)
Pre visit review using our clinic review tool, if applicable. No additional management support is needed unless otherwise documented below in the visit note. 

## 2014-06-07 NOTE — Patient Instructions (Signed)

## 2014-06-07 NOTE — Progress Notes (Signed)
Subjective:    Patient ID: Erin [name], female    DOB: 10-27-1945, 69 y.o.   MRN: [id number]  HPI  Patient here for con't cough-- but other symptoms have subsided.  She also states the edema has improved a lot.   .  Past Medical History  Diagnosis Date  . Depression   . Hyperlipidemia   . Chronic kidney disease   . CHF (congestive heart failure)     hospital 11/2013-- [place]  . Diabetes mellitus   . Hypertension   . Asthma   . GERD (gastroesophageal reflux disease)     Review of Systems  Constitutional: Negative for activity change, appetite change, fatigue and unexpected weight change.  Respiratory: Positive for cough. Negative for shortness of breath.   Cardiovascular: Positive for leg swelling. Negative for chest pain and palpitations.  Psychiatric/Behavioral: Negative for behavioral problems and dysphoric mood. The patient is not nervous/anxious.        Objective:    Physical Exam  Constitutional: She is oriented to person, place, and time. She appears well-developed and well-nourished. No distress.  HENT:  Right Ear: External ear normal.  Left Ear: External ear normal.  Nose: Nose normal.  Mouth/Throat: Oropharynx is clear and moist.  Eyes: EOM are normal. Pupils are equal, round, and reactive to light.  Neck: Normal range of motion. Neck supple.  Cardiovascular: Normal rate, regular rhythm and normal heart sounds.   No murmur heard. Pulmonary/Chest: Effort normal and breath sounds normal. No respiratory distress. She has no wheezes. She has no rales. She exhibits no tenderness.  Musculoskeletal: She exhibits edema. She exhibits no tenderness.  Neurological: She is alert and oriented to person, place, and time.  Psychiatric: She has a normal mood and affect. Her behavior is normal. Judgment and thought content normal.    BP 136/56 mmHg  Pulse 80  Temp(Src) 98 F (36.7 C) (Oral)  Wt 256 lb 6.4 oz (116.302 kg)  SpO2 95% Wt Readings from Last 3  Encounters:  06/07/14 256 lb 6.4 oz (116.302 kg)  05/24/14 255 lb 12.8 oz (116.03 kg)  05/15/14 242 lb (109.77 kg)     Lab Results  Component Value Date   WBC 6.3 06/07/2014   HGB 9.9* 06/07/2014   HCT 30.1* 06/07/2014   PLT 232.0 06/07/2014   GLUCOSE 95 05/24/2014   CHOL 136 02/05/2014   TRIG 111.0 02/05/2014   HDL 47.30 02/05/2014   LDLDIRECT 79.2 01/17/2013   LDLCALC 67 02/05/2014   ALT 24 05/24/2014   AST 28 05/24/2014   NA 138 05/24/2014   K 3.8 05/24/2014   CL 104 05/24/2014   CREATININE 1.38* 05/24/2014   BUN 34* 05/24/2014   CO2 26 05/24/2014   TSH 2.29 05/17/2011   HGBA1C 5.8 01/11/2014   MICROALBUR 1.5 02/05/2014    Dg Chest 2 View  05/24/2014   CLINICAL DATA:  69 year old female with shortness of breath. Cough x4 days. Initial encounter. Query pneumonia  EXAM: CHEST  2 VIEW  COMPARISON:  05/02/2014 and earlier.  FINDINGS: Diffusely increased interstitial markings. No pleural effusion or consolidation. No confluent pulmonary opacity. Stable cardiomegaly and mediastinal contours. No pneumothorax. Visualized tracheal air column is within normal limits. No acute osseous abnormality identified. Stable cholecystectomy clips.  IMPRESSION: Bilateral increased pulmonary interstitial markings. Top differential considerations include acute viral/atypical respiratory infection and mild interstitial edema.   Electronically Signed   By: Genevie Ann M.D.   On: 05/24/2014 15:22  Assessment & Plan:   Problem List Items Addressed This Visit      Unprioritized   Edema    Cont diuretic Elevate legs Compression hose      CAP (community acquired pneumonia)    Resolving  Still with post infectious cough.        Other Visit Diagnoses    Anemia, iron deficiency    -  Primary    Relevant Orders    CBC with Differential/Platelet (Completed)    IBC panel (Completed)        Garnet Koyanagi, DO

## 2014-06-09 DIAGNOSIS — R609 Edema, unspecified: Secondary | ICD-10-CM | POA: Insufficient documentation

## 2014-06-09 NOTE — Assessment & Plan Note (Signed)
Resolving  Still with post infectious cough.

## 2014-06-09 NOTE — Assessment & Plan Note (Signed)
Cont diuretic Elevate legs Compression hose

## 2014-06-11 ENCOUNTER — Ambulatory Visit (HOSPITAL_COMMUNITY)
Admission: RE | Admit: 2014-06-11 | Discharge: 2014-06-11 | Disposition: A | Discharge status: Home / Self Care | Payer: Medicare Other | Source: Ambulatory Visit | Attending: Cardiology | Admitting: Cardiology

## 2014-06-11 ENCOUNTER — Other Ambulatory Visit (HOSPITAL_COMMUNITY): Payer: Self-pay | Admitting: Cardiology

## 2014-06-11 DIAGNOSIS — I509 Heart failure, unspecified: Secondary | ICD-10-CM

## 2014-06-11 DIAGNOSIS — R42 Dizziness and giddiness: Secondary | ICD-10-CM | POA: Diagnosis not present

## 2014-06-11 DIAGNOSIS — R9431 Abnormal electrocardiogram [ECG] [EKG]: Secondary | ICD-10-CM | POA: Diagnosis not present

## 2014-06-11 DIAGNOSIS — Z87891 Personal history of nicotine dependence: Secondary | ICD-10-CM | POA: Diagnosis not present

## 2014-06-11 DIAGNOSIS — G4733 Obstructive sleep apnea (adult) (pediatric): Secondary | ICD-10-CM | POA: Insufficient documentation

## 2014-06-11 DIAGNOSIS — E119 Type 2 diabetes mellitus without complications: Secondary | ICD-10-CM | POA: Diagnosis not present

## 2014-06-11 DIAGNOSIS — J45909 Unspecified asthma, uncomplicated: Secondary | ICD-10-CM | POA: Insufficient documentation

## 2014-06-11 DIAGNOSIS — Z8249 Family history of ischemic heart disease and other diseases of the circulatory system: Secondary | ICD-10-CM | POA: Diagnosis not present

## 2014-06-11 DIAGNOSIS — E669 Obesity, unspecified: Secondary | ICD-10-CM | POA: Diagnosis not present

## 2014-06-11 MED ORDER — TECHNETIUM TC 99M SESTAMIBI GENERIC - CARDIOLITE
31.2000 | Freq: Once | INTRAVENOUS | Status: AC | PRN
  Administered 2014-06-11: 31.2 via INTRAVENOUS

## 2014-06-11 MED ORDER — TECHNETIUM TC 99M SESTAMIBI GENERIC - CARDIOLITE
10.1000 | Freq: Once | INTRAVENOUS | Status: AC | PRN
  Administered 2014-06-11: 10.1 via INTRAVENOUS

## 2014-06-11 MED ORDER — AMINOPHYLLINE 25 MG/ML IV SOLN
100.0000 mg | Freq: Once | INTRAVENOUS | Status: AC
  Administered 2014-06-11: 100 mg via INTRAVENOUS

## 2014-06-11 MED ORDER — REGADENOSON 0.4 MG/5ML IV SOLN
0.4000 mg | Freq: Once | INTRAVENOUS | Status: AC
  Administered 2014-06-11: 0.4 mg via INTRAVENOUS

## 2014-06-11 NOTE — Procedures (Addendum)
[facility] [address] [place] [address] [phone]  Cardiology Nuclear Med Study  FEDRA LANTER is a 69 y.o. female     MRN : [id number]     DOB: 17-Sep-1945  Procedure Date: 06/11/2014  Nuclear Med Background Indication for Stress Test:  Evaluation for Ischemia and Abnormal EKG History:  Asthma and CHF;CKD;OSA Cardiac Risk Factors: Family History - CAD, History of Smoking, Lipids, NIDDM and Obesity  Symptoms:  Chest Pain, Dizziness, DOE and Light-Headedness   Nuclear Pre-Procedure Caffeine/Decaff Intake:  8:00pm NPO After: 4:00am   IV Site: R Forearm  IV 0.9% NS with Angio Cath:  22g  Chest Size (in):  N/A IV Started by: Rolene Course, RN  Height: 5\' 4"  (1.626 m)  Cup Size: D  BMI:  Body mass index is 41.52 kg/(m^2). Weight:  242 lb (109.77 kg)   Tech Comments:  n/a    Nuclear Med Study 1 or 2 day study: 1 day  Stress Test Type:  [place] Provider:  Kirk Ruths, MD   Resting Radionuclide: Technetium 27m Sestamibi  Resting Radionuclide Dose: 10.1 mCi   Stress Radionuclide:  Technetium 53m Sestamibi  Stress Radionuclide Dose: 31.2 mCi           Stress Protocol Rest HR: 79 Stress HR: 92  Rest BP: 162/61 Stress BP: 162/61  Exercise Time (min): n/a METS: n/a          Dose of Adenosine (mg):  n/a Dose of Lexiscan: 0.4 mg  Dose of Atropine (mg): n/a Dose of Dobutamine: n/a mcg/kg/min (at max HR)  Stress Test Technologist: Mellody Memos, CCT Nuclear Technologist: Imagene Riches, CNMT   Rest Procedure:  Myocardial perfusion imaging was performed at rest 45 minutes following the intravenous administration of Technetium 38m Sestamibi. Stress Procedure:  The patient received IV Lexiscan 0.4 mg over 15-seconds.  Technetium 75m Sestamibi injected IV at 30-seconds.  Patient experienced shortness of breath, dizziness, nausea and was administered 100 mg of Aminophylline IV. There were no  significant changes with Lexiscan.  Quantitative spect images were obtained after a 45 minute delay.  Transient Ischemic Dilatation (Normal <1.22):  1.09  QGS EDV:  111 ml QGS ESV:  27 ml LV Ejection Fraction: 76%  Rest ECG: NSR - Normal EKG  Stress ECG: No significant change from baseline ECG  QPS Raw Data Images:  Normal; no motion artifact; normal heart/lung ratio. Stress Images:  Normal homogeneous uptake in all areas of the myocardium. Rest Images:  Normal homogeneous uptake in all areas of the myocardium. Subtraction (SDS):  No evidence of ischemia.  Impression Exercise Capacity:  Lexiscan with no exercise. BP Response:  Normal blood pressure response. Clinical Symptoms:  No significant symptoms noted. ECG Impression:  No significant ECG changes with Lexiscan. Comparison with Prior Nuclear Study: No previous nuclear study performed  Overall Impression:  Normal stress nuclear study.  LV Wall Motion:  NL LV Function; NL Wall Motion; LVEF 76%  Pixie Casino, MD, [place] Certified in Nuclear Cardiology Attending Cardiologist [place] C, MD  06/11/2014 4:55 PM

## 2014-06-13 ENCOUNTER — Telehealth: Payer: Self-pay | Admitting: Family Medicine

## 2014-06-13 NOTE — Telephone Encounter (Signed)
Spoke with patient and reviewed labs with interpretation.

## 2014-06-13 NOTE — Telephone Encounter (Signed)
LM for call back

## 2014-06-13 NOTE — Telephone Encounter (Signed)
Caller name:Mccuistion Hassan Rowan Relation to YT:MMIT Call back number:[phone] Pharmacy:  Reason for call: pt states she received her lab results in the mail and she does not understand it, would like a call back to review her results

## 2014-06-17 DIAGNOSIS — N183 Chronic kidney disease, stage 3 (moderate): Secondary | ICD-10-CM | POA: Diagnosis not present

## 2014-06-18 DIAGNOSIS — N183 Chronic kidney disease, stage 3 (moderate): Secondary | ICD-10-CM | POA: Diagnosis not present

## 2014-06-18 DIAGNOSIS — E559 Vitamin D deficiency, unspecified: Secondary | ICD-10-CM | POA: Diagnosis not present

## 2014-06-18 DIAGNOSIS — I129 Hypertensive chronic kidney disease with stage 1 through stage 4 chronic kidney disease, or unspecified chronic kidney disease: Secondary | ICD-10-CM | POA: Diagnosis not present

## 2014-06-18 DIAGNOSIS — D631 Anemia in chronic kidney disease: Secondary | ICD-10-CM | POA: Diagnosis not present

## 2014-06-18 DIAGNOSIS — E889 Metabolic disorder, unspecified: Secondary | ICD-10-CM | POA: Diagnosis not present

## 2014-06-18 DIAGNOSIS — M9080 Osteopathy in diseases classified elsewhere, unspecified site: Secondary | ICD-10-CM | POA: Diagnosis not present

## 2014-06-20 ENCOUNTER — Ambulatory Visit: Payer: Medicare Other | Admitting: Family Medicine

## 2014-07-02 ENCOUNTER — Other Ambulatory Visit (INDEPENDENT_AMBULATORY_CARE_PROVIDER_SITE_OTHER): Payer: Medicare Other

## 2014-07-02 DIAGNOSIS — D509 Iron deficiency anemia, unspecified: Secondary | ICD-10-CM | POA: Diagnosis not present

## 2014-07-02 DIAGNOSIS — J189 Pneumonia, unspecified organism: Secondary | ICD-10-CM

## 2014-07-03 LAB — POC HEMOCCULT BLD/STL (HOME/3-CARD/SCREEN)
Card #2 Fecal Occult Blod, POC: NEGATIVE
Card #3 Fecal Occult Blood, POC: NEGATIVE
Fecal Occult Blood, POC: NEGATIVE

## 2014-07-07 ENCOUNTER — Other Ambulatory Visit: Payer: Self-pay | Admitting: Internal Medicine

## 2014-07-08 ENCOUNTER — Other Ambulatory Visit: Payer: Self-pay | Admitting: *Deleted

## 2014-07-08 MED ORDER — GLIPIZIDE 5 MG PO TABS: ORAL_TABLET | ORAL | Status: DC

## 2014-07-10 ENCOUNTER — Encounter: Payer: Self-pay | Admitting: Cardiology

## 2014-07-10 ENCOUNTER — Ambulatory Visit (INDEPENDENT_AMBULATORY_CARE_PROVIDER_SITE_OTHER): Payer: Medicare Other | Admitting: Cardiology

## 2014-07-10 VITALS — BP 134/58 | HR 83 | Ht 64.0 in | Wt 260.0 lb

## 2014-07-10 DIAGNOSIS — I5032 Chronic diastolic (congestive) heart failure: Secondary | ICD-10-CM

## 2014-07-10 DIAGNOSIS — I10 Essential (primary) hypertension: Secondary | ICD-10-CM | POA: Diagnosis not present

## 2014-07-10 DIAGNOSIS — R0609 Other forms of dyspnea: Secondary | ICD-10-CM

## 2014-07-10 DIAGNOSIS — R0600 Dyspnea, unspecified: Secondary | ICD-10-CM

## 2014-07-10 NOTE — Assessment & Plan Note (Signed)
Patient with chronic diastolic congestive heart failure. Volume status appears to be stable although difficult to assess. Continue present dose of Lasix. Potassium and renal function monitored by nephrology.

## 2014-07-10 NOTE — Assessment & Plan Note (Signed)
This is most likely multifactorial including obesity hypoventilation syndrome, obstructive sleep apnea, deconditioning and pulmonary venous hypertension from diastolic congestive heart failure. Continue present dose of Lasix. She is followed by pulmonary in [place]. Her sleep apnea is being treated. If they feel she needs right and left cardiac catheterization we can arrange.

## 2014-07-10 NOTE — Assessment & Plan Note (Signed)
Continue statin. 

## 2014-07-10 NOTE — Progress Notes (Signed)
HPI: FU dyspnea/CHF. Patient was admitted in [facility] in August 2015 with malignant hypertension, dyspnea and chest pain. She had acute renal failure. Cardiac markers were negative. Echocardiogram showed ejection fraction 55-60% and mild pulmonary hypertension. VQ scan negative. Echocardiogram December 2015 showed normal LV function and mild left atrial enlargement. Nuclear study March 2016 showed ejection fraction 76% and normal perfusion. Since last seen, she has some dyspnea on exertion but no orthopnea or PND. Minimal pedal edema. No chest pain or syncope.  Current Outpatient Prescriptions  Medication Sig Dispense Refill  . albuterol (PROVENTIL) (2.5 MG/3ML) 0.083% nebulizer solution Take 2.5 mg by nebulization every 6 (six) hours as needed for wheezing or shortness of breath.    Marland Kitchen atorvastatin (LIPITOR) 40 MG tablet Take 1 tablet (40 mg total) by mouth daily. 30 tablet 5  . busPIRone (BUSPAR) 15 MG tablet Take 1 tablet (15 mg total) by mouth 3 (three) times daily. 60 tablet 5  . carvedilol (COREG) 6.25 MG tablet Take 1 tablet (6.25 mg total) by mouth every 12 (twelve) hours. 180 tablet 3  . ergocalciferol (VITAMIN D2) 50000 UNITS capsule Take 50,000 Units by mouth once a week.    . fenofibrate 160 MG tablet Take 1 tablet (160 mg total) by mouth daily. 90 tablet 1  . Ferrous Sulfate (IRON) 325 (65 FE) MG TABS Take 1 tablet by mouth 2 (two) times daily.    . Fluticasone-Salmeterol (ADVAIR DISKUS) 250-50 MCG/DOSE AEPB Inhale 1 puff into the lungs 2 (two) times daily. 1 each 3  . furosemide (LASIX) 20 MG tablet Take 3 tablets (60 mg total) by mouth daily. 270 tablet 3  . glipiZIDE (GLUCOTROL) 5 MG tablet TAKE TWO TABLETS BY MOUTH TWICE DAILY AS DIRECTED 60 tablet 1  . GuaiFENesin (MUCINEX PO) Take 1 tablet by mouth 2 (two) times daily.    . hydrALAZINE (APRESOLINE) 50 MG tablet Take 1 tablet (50 mg total) by mouth 3 (three) times daily. 90 tablet 5  . isosorbide mononitrate (IMDUR) 30 MG  24 hr tablet Take 1 tablet (30 mg total) by mouth daily. 30 tablet 5  . pantoprazole (PROTONIX) 40 MG tablet Take 1 tablet (40 mg total) by mouth daily. (Patient taking differently: Take 40 mg by mouth 2 (two) times daily. ) 30 tablet 11  . pramipexole (MIRAPEX) 1 MG tablet TAKE ONE TABLET BY MOUTH AT BEDTIME 30 tablet 5  . sertraline (ZOLOFT) 50 MG tablet Take 1 tablet (50 mg total) by mouth daily. (Patient taking differently: Take 75 mg by mouth daily. ) 90 tablet 3  . TRADJENTA 5 MG TABS tablet TAKE ONE TABLET BY MOUTH ONCE DAILY 30 tablet 0   No current facility-administered medications for this visit.     Past Medical History  Diagnosis Date  . Depression   . Hyperlipidemia   . Chronic kidney disease   . CHF (congestive heart failure)     hospital 11/2013-- [place]  . Diabetes mellitus   . Hypertension   . Asthma   . GERD (gastroesophageal reflux disease)     Past Surgical History  Procedure Laterality Date  . Cholecystectomy  2002  . Tubal ligation    . Tonsillectomy    . Spine surgery  aug 2015    [facility]    History   Social History  . Marital Status: Widowed    Spouse Name: N/A  . Number of Children: 4  . Years of Education: N/A  Occupational History  . DELI-MANAGER @ [employer] History Main Topics  . Smoking status: Former Smoker -- 1.50 packs/day for 20 years    Types: Cigarettes    Quit date: 08/02/1984  . Smokeless tobacco: Never Used  . Alcohol Use: No  . Drug Use: No  . Sexual Activity:    Partners: Male   Other Topics Concern  . Not on file   Social History Narrative   NO REG EXERCISE   Caffeine use: daily    ROS: no fevers or chills, productive cough, hemoptysis, dysphasia, odynophagia, melena, hematochezia, dysuria, hematuria, rash, seizure activity, orthopnea, PND, pedal edema, claudication. Remaining systems are negative.  Physical Exam: Well-developed obese in no acute distress.  Skin is warm and dry.  HEENT  is normal.  Neck is supple.  Chest is clear to auscultation with normal expansion.  Cardiovascular exam is regular rate and rhythm.  Abdominal exam nontender or distended. No masses palpated. Extremities show trace edema. neuro grossly intact

## 2014-07-10 NOTE — Assessment & Plan Note (Signed)
Patient counseled on weight loss. 

## 2014-07-10 NOTE — Patient Instructions (Signed)
Your physician wants you to follow-up in: 6 MONTHS WITH DR CRENSHAW You will receive a reminder letter in the mail two months in advance. If you don't receive a letter, please call our office to schedule the follow-up appointment.  

## 2014-07-10 NOTE — Assessment & Plan Note (Signed)
Blood pressure controlled. Continue present medications. 

## 2014-07-12 ENCOUNTER — Telehealth: Payer: Self-pay | Admitting: Family Medicine

## 2014-07-12 DIAGNOSIS — E785 Hyperlipidemia, unspecified: Secondary | ICD-10-CM

## 2014-07-12 NOTE — Telephone Encounter (Signed)
trazadone 50 mg 1/2 -1 po qhs prn #30  -----make sure pt discusses this at next ov--- may need pulm referral for sleep study

## 2014-07-12 NOTE — Telephone Encounter (Signed)
Caller name: Amyiah Relation to pt: self Call back number: [phone] Pharmacy: [facility] in [place]  Reason for call:   Patient schedule repeat labs for late April. Please enter order.   Also, patient states that she is having problems sleeping and is wanting to know if she could be prescribed something for this.

## 2014-07-12 NOTE — Telephone Encounter (Signed)
Requesting something for Insomnia. Please advise     KP

## 2014-07-15 ENCOUNTER — Other Ambulatory Visit: Payer: Self-pay

## 2014-07-15 MED ORDER — FENOFIBRATE 160 MG PO TABS: 160.0000 mg | ORAL_TABLET | Freq: Every day | ORAL | Status: DC

## 2014-07-17 ENCOUNTER — Other Ambulatory Visit (INDEPENDENT_AMBULATORY_CARE_PROVIDER_SITE_OTHER): Payer: Medicare Other

## 2014-07-17 ENCOUNTER — Telehealth: Payer: Self-pay | Admitting: Radiology

## 2014-07-17 DIAGNOSIS — D649 Anemia, unspecified: Secondary | ICD-10-CM

## 2014-07-17 DIAGNOSIS — E785 Hyperlipidemia, unspecified: Secondary | ICD-10-CM | POA: Diagnosis not present

## 2014-07-17 LAB — CBC WITH DIFFERENTIAL/PLATELET
Basophils Absolute: 0 10*3/uL (ref 0.0–0.1)
Basophils Relative: 0.4 % (ref 0.0–3.0)
Eosinophils Absolute: 0.1 10*3/uL (ref 0.0–0.7)
Eosinophils Relative: 0.8 % (ref 0.0–5.0)
HCT: 31.2 % — ABNORMAL LOW (ref 36.0–46.0)
Hemoglobin: 10.4 g/dL — ABNORMAL LOW (ref 12.0–15.0)
Lymphocytes Relative: 11.3 % — ABNORMAL LOW (ref 12.0–46.0)
Lymphs Abs: 0.7 10*3/uL (ref 0.7–4.0)
MCHC: 33.3 g/dL (ref 30.0–36.0)
MCV: 79.4 fl (ref 78.0–100.0)
Monocytes Absolute: 0.3 10*3/uL (ref 0.1–1.0)
Monocytes Relative: 5 % (ref 3.0–12.0)
Neutro Abs: 5.5 10*3/uL (ref 1.4–7.7)
Neutrophils Relative %: 82.5 % — ABNORMAL HIGH (ref 43.0–77.0)
Platelets: 208 10*3/uL (ref 150.0–400.0)
RBC: 3.93 Mil/uL (ref 3.87–5.11)
RDW: 21.9 % — ABNORMAL HIGH (ref 11.5–15.5)
WBC: 6.6 10*3/uL (ref 4.0–10.5)

## 2014-07-17 NOTE — Telephone Encounter (Signed)
Called patient wrong office

## 2014-07-17 NOTE — Telephone Encounter (Signed)
lmom to give [place] a call back make sure she knows about future labs and med for sleep

## 2014-07-18 LAB — LIPID PANEL
Cholesterol: 135 mg/dL (ref 0–200)
HDL: 45 mg/dL (ref >39.00)
NonHDL: 90
Total CHOL/HDL Ratio: 3
Triglycerides: 228 mg/dL — ABNORMAL HIGH (ref 0.0–149.0)
VLDL: 45.6 mg/dL — ABNORMAL HIGH (ref 0.0–40.0)

## 2014-07-18 LAB — BASIC METABOLIC PANEL
BUN: 44 mg/dL — ABNORMAL HIGH (ref 6–23)
CO2: 24 mEq/L (ref 19–32)
Calcium: 9.3 mg/dL (ref 8.4–10.5)
Chloride: 102 mEq/L (ref 96–112)
Creatinine, Ser: 1.34 mg/dL — ABNORMAL HIGH (ref 0.40–1.20)
GFR: 41.65 mL/min — ABNORMAL LOW (ref >60.00)
Glucose, Bld: 217 mg/dL — ABNORMAL HIGH (ref 70–99)
Potassium: 4.2 mEq/L (ref 3.5–5.1)
Sodium: 135 mEq/L (ref 135–145)

## 2014-07-18 LAB — HEPATIC FUNCTION PANEL
ALT: 22 U/L (ref 0–35)
AST: 22 U/L (ref 0–37)
Albumin: 4.1 g/dL (ref 3.5–5.2)
Alkaline Phosphatase: 82 U/L (ref 39–117)
Bilirubin, Direct: 0.1 mg/dL (ref 0.0–0.3)
Total Bilirubin: 0.5 mg/dL (ref 0.2–1.2)
Total Protein: 7.2 g/dL (ref 6.0–8.3)

## 2014-07-18 LAB — LDL CHOLESTEROL, DIRECT: Direct LDL: 62 mg/dL

## 2014-07-18 NOTE — Addendum Note (Signed)
Addended by: Harl Bowie on: 07/18/2014 09:27 AM   Modules accepted: Orders

## 2014-07-19 ENCOUNTER — Other Ambulatory Visit: Payer: Self-pay

## 2014-07-19 MED ORDER — PRAMIPEXOLE DIHYDROCHLORIDE 1 MG PO TABS: 1.0000 mg | ORAL_TABLET | Freq: Every day | ORAL | Status: DC

## 2014-07-23 ENCOUNTER — Telehealth: Payer: Self-pay

## 2014-07-23 NOTE — Telephone Encounter (Signed)
Pt thinks she got a call from [place]. I couldn't find it and I told her that. Please advise at [phone]

## 2014-07-23 NOTE — Telephone Encounter (Signed)
I am not sure, there is no documentation.

## 2014-07-24 ENCOUNTER — Other Ambulatory Visit: Payer: Medicare Other

## 2014-08-13 ENCOUNTER — Ambulatory Visit: Payer: Medicare Other | Admitting: Family Medicine

## 2014-08-14 DIAGNOSIS — D631 Anemia in chronic kidney disease: Secondary | ICD-10-CM | POA: Diagnosis not present

## 2014-08-14 DIAGNOSIS — N183 Chronic kidney disease, stage 3 (moderate): Secondary | ICD-10-CM | POA: Diagnosis not present

## 2014-08-15 DIAGNOSIS — R0600 Dyspnea, unspecified: Secondary | ICD-10-CM | POA: Diagnosis not present

## 2014-08-15 DIAGNOSIS — J310 Chronic rhinitis: Secondary | ICD-10-CM | POA: Diagnosis not present

## 2014-08-15 DIAGNOSIS — G4733 Obstructive sleep apnea (adult) (pediatric): Secondary | ICD-10-CM | POA: Diagnosis not present

## 2014-08-15 DIAGNOSIS — F5101 Primary insomnia: Secondary | ICD-10-CM | POA: Diagnosis not present

## 2014-08-19 DIAGNOSIS — M9080 Osteopathy in diseases classified elsewhere, unspecified site: Secondary | ICD-10-CM | POA: Diagnosis not present

## 2014-08-19 DIAGNOSIS — N183 Chronic kidney disease, stage 3 (moderate): Secondary | ICD-10-CM | POA: Diagnosis not present

## 2014-08-19 DIAGNOSIS — M109 Gout, unspecified: Secondary | ICD-10-CM | POA: Diagnosis not present

## 2014-08-19 DIAGNOSIS — E889 Metabolic disorder, unspecified: Secondary | ICD-10-CM | POA: Diagnosis not present

## 2014-08-19 DIAGNOSIS — D631 Anemia in chronic kidney disease: Secondary | ICD-10-CM | POA: Diagnosis not present

## 2014-08-19 DIAGNOSIS — E559 Vitamin D deficiency, unspecified: Secondary | ICD-10-CM | POA: Diagnosis not present

## 2014-08-19 DIAGNOSIS — I129 Hypertensive chronic kidney disease with stage 1 through stage 4 chronic kidney disease, or unspecified chronic kidney disease: Secondary | ICD-10-CM | POA: Diagnosis not present

## 2014-08-26 ENCOUNTER — Encounter: Payer: Self-pay | Admitting: Gastroenterology

## 2014-08-28 DIAGNOSIS — N183 Chronic kidney disease, stage 3 (moderate): Secondary | ICD-10-CM | POA: Diagnosis not present

## 2014-08-30 ENCOUNTER — Other Ambulatory Visit: Payer: Self-pay | Admitting: Internal Medicine

## 2014-09-03 DIAGNOSIS — E559 Vitamin D deficiency, unspecified: Secondary | ICD-10-CM | POA: Diagnosis not present

## 2014-09-03 DIAGNOSIS — D631 Anemia in chronic kidney disease: Secondary | ICD-10-CM | POA: Diagnosis not present

## 2014-09-03 DIAGNOSIS — I129 Hypertensive chronic kidney disease with stage 1 through stage 4 chronic kidney disease, or unspecified chronic kidney disease: Secondary | ICD-10-CM | POA: Diagnosis not present

## 2014-09-03 DIAGNOSIS — M9080 Osteopathy in diseases classified elsewhere, unspecified site: Secondary | ICD-10-CM | POA: Diagnosis not present

## 2014-09-03 DIAGNOSIS — E889 Metabolic disorder, unspecified: Secondary | ICD-10-CM | POA: Diagnosis not present

## 2014-09-03 DIAGNOSIS — N183 Chronic kidney disease, stage 3 (moderate): Secondary | ICD-10-CM | POA: Diagnosis not present

## 2014-09-03 DIAGNOSIS — M109 Gout, unspecified: Secondary | ICD-10-CM | POA: Diagnosis not present

## 2014-09-04 ENCOUNTER — Other Ambulatory Visit: Payer: Self-pay | Admitting: *Deleted

## 2014-09-04 MED ORDER — GLIPIZIDE 5 MG PO TABS: ORAL_TABLET | ORAL | Status: DC

## 2014-09-10 DIAGNOSIS — M47896 Other spondylosis, lumbar region: Secondary | ICD-10-CM | POA: Diagnosis not present

## 2014-09-30 NOTE — Patient Outreach (Signed)
[place] [facility]) Care Management  09/30/2014  Erin [name] November 20, 1945 [id number]   Referral from [place] [facility], assigned Quinn Plowman, RN to outreach.  Erin [name]. [place], [place] Management [place] Assistant Phone: [phone] Fax: [phone]

## 2014-10-08 DIAGNOSIS — R0600 Dyspnea, unspecified: Secondary | ICD-10-CM | POA: Diagnosis not present

## 2014-10-08 DIAGNOSIS — G4733 Obstructive sleep apnea (adult) (pediatric): Secondary | ICD-10-CM | POA: Diagnosis not present

## 2014-10-08 DIAGNOSIS — R5383 Other fatigue: Secondary | ICD-10-CM | POA: Diagnosis not present

## 2014-10-08 DIAGNOSIS — J310 Chronic rhinitis: Secondary | ICD-10-CM | POA: Diagnosis not present

## 2014-10-22 DIAGNOSIS — R5383 Other fatigue: Secondary | ICD-10-CM | POA: Diagnosis not present

## 2014-10-22 DIAGNOSIS — G4733 Obstructive sleep apnea (adult) (pediatric): Secondary | ICD-10-CM | POA: Diagnosis not present

## 2014-10-22 DIAGNOSIS — J310 Chronic rhinitis: Secondary | ICD-10-CM | POA: Diagnosis not present

## 2014-10-22 DIAGNOSIS — R0600 Dyspnea, unspecified: Secondary | ICD-10-CM | POA: Diagnosis not present

## 2014-10-24 ENCOUNTER — Other Ambulatory Visit: Payer: Self-pay

## 2014-10-24 NOTE — Patient Outreach (Signed)
[place] [facility]) Care Management  10/24/2014  CONGETTA ODRISCOLL [birth year] [id number]  Telephone call to patient regarding high risk list referral.  Discussed and offered [facility] care management services to patient. Patient refused services.   PLAN;  RNCM will refer patient to [place] to close due to refusal of services. RNCM will notify patients primary MD.   Quinn Plowman RN,BSN,CCM [place] Coordinator [phone]

## 2014-10-28 DIAGNOSIS — G4733 Obstructive sleep apnea (adult) (pediatric): Secondary | ICD-10-CM | POA: Diagnosis not present

## 2014-10-28 DIAGNOSIS — J310 Chronic rhinitis: Secondary | ICD-10-CM | POA: Diagnosis not present

## 2014-10-28 DIAGNOSIS — R0600 Dyspnea, unspecified: Secondary | ICD-10-CM | POA: Diagnosis not present

## 2014-10-28 DIAGNOSIS — R5383 Other fatigue: Secondary | ICD-10-CM | POA: Diagnosis not present

## 2014-10-29 NOTE — Patient Outreach (Signed)
[place] [facility]) Care Management  10/29/2014  Erin [name] [birth year] [id number]   Notification from Erin Plowman, Erin [name] to close case due to patient refused [place] Management services.  Erin [name]. [place], [place] Management [place] Assistant Phone: [phone] Fax: [phone]

## 2014-11-03 ENCOUNTER — Other Ambulatory Visit: Payer: Self-pay | Admitting: Family Medicine

## 2014-12-03 ENCOUNTER — Other Ambulatory Visit: Payer: Self-pay | Admitting: Family Medicine

## 2014-12-04 DIAGNOSIS — N183 Chronic kidney disease, stage 3 (moderate): Secondary | ICD-10-CM | POA: Diagnosis not present

## 2014-12-05 DIAGNOSIS — M109 Gout, unspecified: Secondary | ICD-10-CM | POA: Diagnosis not present

## 2014-12-05 DIAGNOSIS — E1122 Type 2 diabetes mellitus with diabetic chronic kidney disease: Secondary | ICD-10-CM | POA: Diagnosis not present

## 2014-12-05 DIAGNOSIS — E559 Vitamin D deficiency, unspecified: Secondary | ICD-10-CM | POA: Diagnosis not present

## 2014-12-05 DIAGNOSIS — D631 Anemia in chronic kidney disease: Secondary | ICD-10-CM | POA: Diagnosis not present

## 2014-12-05 DIAGNOSIS — N183 Chronic kidney disease, stage 3 (moderate): Secondary | ICD-10-CM | POA: Diagnosis not present

## 2014-12-05 DIAGNOSIS — I129 Hypertensive chronic kidney disease with stage 1 through stage 4 chronic kidney disease, or unspecified chronic kidney disease: Secondary | ICD-10-CM | POA: Diagnosis not present

## 2014-12-10 ENCOUNTER — Ambulatory Visit (INDEPENDENT_AMBULATORY_CARE_PROVIDER_SITE_OTHER): Payer: Medicare Other | Admitting: Family Medicine

## 2014-12-10 ENCOUNTER — Telehealth: Payer: Self-pay

## 2014-12-10 ENCOUNTER — Encounter: Payer: Self-pay | Admitting: Family Medicine

## 2014-12-10 VITALS — BP 140/62 | HR 87 | Temp 97.6°F | Ht 64.0 in | Wt 271.6 lb

## 2014-12-10 DIAGNOSIS — E1159 Type 2 diabetes mellitus with other circulatory complications: Secondary | ICD-10-CM

## 2014-12-10 DIAGNOSIS — I5020 Unspecified systolic (congestive) heart failure: Secondary | ICD-10-CM | POA: Diagnosis not present

## 2014-12-10 DIAGNOSIS — Z23 Encounter for immunization: Secondary | ICD-10-CM

## 2014-12-10 DIAGNOSIS — E785 Hyperlipidemia, unspecified: Secondary | ICD-10-CM

## 2014-12-10 DIAGNOSIS — Z0000 Encounter for general adult medical examination without abnormal findings: Secondary | ICD-10-CM | POA: Diagnosis not present

## 2014-12-10 DIAGNOSIS — E1151 Type 2 diabetes mellitus with diabetic peripheral angiopathy without gangrene: Secondary | ICD-10-CM

## 2014-12-10 DIAGNOSIS — F419 Anxiety disorder, unspecified: Secondary | ICD-10-CM

## 2014-12-10 DIAGNOSIS — E1165 Type 2 diabetes mellitus with hyperglycemia: Secondary | ICD-10-CM

## 2014-12-10 DIAGNOSIS — IMO0002 Reserved for concepts with insufficient information to code with codable children: Secondary | ICD-10-CM

## 2014-12-10 LAB — COMPREHENSIVE METABOLIC PANEL
ALT: 17 U/L (ref 0–35)
AST: 20 U/L (ref 0–37)
Albumin: 4.1 g/dL (ref 3.5–5.2)
Alkaline Phosphatase: 68 U/L (ref 39–117)
BUN: 33 mg/dL — ABNORMAL HIGH (ref 6–23)
CO2: 25 mEq/L (ref 19–32)
Calcium: 9.2 mg/dL (ref 8.4–10.5)
Chloride: 102 mEq/L (ref 96–112)
Creatinine, Ser: 1.48 mg/dL — ABNORMAL HIGH (ref 0.40–1.20)
GFR: 37.1 mL/min — ABNORMAL LOW (ref >60.00)
Glucose, Bld: 225 mg/dL — ABNORMAL HIGH (ref 70–99)
Potassium: 4.1 mEq/L (ref 3.5–5.1)
Sodium: 137 mEq/L (ref 135–145)
Total Bilirubin: 0.6 mg/dL (ref 0.2–1.2)
Total Protein: 7.3 g/dL (ref 6.0–8.3)

## 2014-12-10 LAB — LIPID PANEL
Cholesterol: 123 mg/dL (ref 0–200)
HDL: 39.4 mg/dL (ref >39.00)
LDL Cholesterol: 46 mg/dL (ref 0–99)
NonHDL: 83.48
Total CHOL/HDL Ratio: 3
Triglycerides: 186 mg/dL — ABNORMAL HIGH (ref 0.0–149.0)
VLDL: 37.2 mg/dL (ref 0.0–40.0)

## 2014-12-10 LAB — HEMOGLOBIN A1C: Hgb A1c MFr Bld: 9 % — ABNORMAL HIGH (ref 4.6–6.5)

## 2014-12-10 MED ORDER — GLIPIZIDE 5 MG PO TABS: ORAL_TABLET | ORAL | Status: DC

## 2014-12-10 MED ORDER — ISOSORBIDE MONONITRATE ER 30 MG PO TB24: 30.0000 mg | ORAL_TABLET | Freq: Every day | ORAL | Status: DC

## 2014-12-10 MED ORDER — BUSPIRONE HCL 15 MG PO TABS: 15.0000 mg | ORAL_TABLET | Freq: Three times a day (TID) | ORAL | Status: DC

## 2014-12-10 MED ORDER — LINAGLIPTIN 5 MG PO TABS: 5.0000 mg | ORAL_TABLET | Freq: Every day | ORAL | Status: DC

## 2014-12-10 NOTE — Patient Instructions (Signed)

## 2014-12-10 NOTE — Telephone Encounter (Signed)
Returned call to patient follow up appointment scheduled with Dr.Crenshaw 01/13/15 at 11:30 am.

## 2014-12-10 NOTE — Progress Notes (Signed)
Patient ID: Erin [name], female    DOB: February 08, 1946  Age: 69 y.o. MRN: [id number]    Subjective:  Subjective HPI Erin [name] presents for f/u dm, htn, cholesterol--- pt said she was not going to keep going to [name] because she was leaving---- upon further questioning she received the letter that Erin [name] was moving to [place] and she thought is was tabori.   HPI HYPERTENSION  Blood pressure range--not checking  Chest pain- no      Dyspnea- no Lightheadedness- no   Edema- no Other side effects - no   Medication compliance: fair Low salt diet- no  DIABETES  Blood Sugar ranges-not checking  Polyuria- no New Visual problems- no Hypoglycemic symptoms- no Other side effects-no Medication compliance - no Last eye exam- due Foot exam- today  HYPERLIPIDEMIA  Medication compliance- fair RUQ pain- no  Muscle aches- no Other side effects-no      Review of Systems  Constitutional: Negative for diaphoresis, appetite change, fatigue and unexpected weight change.  Eyes: Negative for pain, redness and visual disturbance.  Respiratory: Negative for cough, chest tightness, shortness of breath and wheezing.   Cardiovascular: Negative for chest pain, palpitations and leg swelling.  Endocrine: Negative for cold intolerance, heat intolerance, polydipsia, polyphagia and polyuria.  Genitourinary: Negative for dysuria, frequency and difficulty urinating.  Neurological: Negative for dizziness, light-headedness, numbness and headaches.    History Past Medical History  Diagnosis Date  . Depression   . Hyperlipidemia   . Chronic kidney disease   . CHF (congestive heart failure)     hospital 11/2013-- [place]  . Diabetes mellitus   . Hypertension   . Asthma   . GERD (gastroesophageal reflux disease)     She has past surgical history that includes Cholecystectomy (2002); Tubal ligation; Tonsillectomy; and Spine surgery (aug 2015).   Her family history includes  Alzheimer's disease in her mother; Arthritis in her brother; Asthma in her father; Cancer (age of onset: 4) in her father; Heart disease in her father, maternal uncle, maternal uncle, and maternal uncle; Heart disease (age of onset: 74) in her sister; Hyperlipidemia in her mother; Hypertension in her father and mother; Stomach cancer in her paternal grandfather; Stroke in her father and maternal uncle; Uterine cancer in her paternal grandmother.She reports that she quit smoking about 30 years ago. Her smoking use included Cigarettes. She has a 30 pack-year smoking history. She has never used smokeless tobacco. She reports that she does not drink alcohol or use illicit drugs.  Current Outpatient Prescriptions on File Prior to Visit  Medication Sig Dispense Refill  . albuterol (PROVENTIL) (2.5 MG/3ML) 0.083% nebulizer solution Take 2.5 mg by nebulization every 6 (six) hours as needed for wheezing or shortness of breath.    Marland Kitchen atorvastatin (LIPITOR) 40 MG tablet Take 1 tablet (40 mg total) by mouth daily. 30 tablet 5  . carvedilol (COREG) 6.25 MG tablet Take 1 tablet (6.25 mg total) by mouth every 12 (twelve) hours. 180 tablet 3  . ergocalciferol (VITAMIN D2) 50000 UNITS capsule Take 50,000 Units by mouth once a week.    . fenofibrate 160 MG tablet Take 1 tablet (160 mg total) by mouth daily. 90 tablet 1  . Ferrous Sulfate (IRON) 325 (65 FE) MG TABS Take 1 tablet by mouth 2 (two) times daily.    . Fluticasone-Salmeterol (ADVAIR DISKUS) 250-50 MCG/DOSE AEPB Inhale 1 puff into the lungs 2 (two) times daily. 1 each 3  . furosemide (LASIX) 20 MG  tablet Take 3 tablets (60 mg total) by mouth daily. 270 tablet 3  . hydrALAZINE (APRESOLINE) 50 MG tablet Take 1 tablet (50 mg total) by mouth 3 (three) times daily. 90 tablet 5  . pantoprazole (PROTONIX) 40 MG tablet Take 1 tablet (40 mg total) by mouth daily. (Patient taking differently: Take 40 mg by mouth 2 (two) times daily. ) 30 tablet 11  . pramipexole  (MIRAPEX) 1 MG tablet Take 1 tablet (1 mg total) by mouth at bedtime. 30 tablet 5  . sertraline (ZOLOFT) 50 MG tablet Take 1 tablet (50 mg total) by mouth daily. (Patient taking differently: Take 75 mg by mouth daily. ) 90 tablet 3   No current facility-administered medications on file prior to visit.     Objective:  Objective Physical Exam  Constitutional: She is oriented to person, place, and time. She appears well-developed and well-nourished.  HENT:  Head: Normocephalic and atraumatic.  Eyes: Conjunctivae and EOM are normal.  Neck: Normal range of motion. Neck supple. No JVD present. Carotid bruit is not present. No thyromegaly present.  Cardiovascular: Normal rate, regular rhythm and normal heart sounds.   No murmur heard. Pulmonary/Chest: Effort normal and breath sounds normal. No respiratory distress. She has no wheezes. She has no rales. She exhibits no tenderness.  Musculoskeletal: She exhibits no edema.  Neurological: She is alert and oriented to person, place, and time.  Psychiatric: She has a normal mood and affect.  Nursing note and vitals reviewed.  BP 140/62 mmHg  Pulse 87  Temp(Src) 97.6 F (36.4 C) (Oral)  Ht _0  (1.626 m)  Wt 271 lb 9.6 oz (123.197 kg)  BMI 46.60 kg/m2  SpO2 98% Wt Readings from Last 3 Encounters:  12/10/14 271 lb 9.6 oz (123.197 kg)  07/10/14 260 lb (117.935 kg)  06/11/14 242 lb (109.77 kg)     Lab Results  Component Value Date   WBC 6.6 07/17/2014   HGB 10.4* 07/17/2014   HCT 31.2* 07/17/2014   PLT 208.0 07/17/2014   GLUCOSE 217* 07/18/2014   CHOL 135 07/18/2014   TRIG 228.0* 07/18/2014   HDL 45.00 07/18/2014   LDLDIRECT 62.0 07/18/2014   LDLCALC 67 02/05/2014   ALT 22 07/18/2014   AST 22 07/18/2014   NA 135 07/18/2014   K 4.2 07/18/2014   CL 102 07/18/2014   CREATININE 1.34* 07/18/2014   BUN 44* 07/18/2014   CO2 24 07/18/2014   TSH 2.29 05/17/2011   HGBA1C 5.8 01/11/2014   MICROALBUR 1.5 02/05/2014    No results  found.   Assessment & Plan:  Plan I have discontinued Erin [name]'s GuaiFENesin (MUCINEX PO). I have changed her TRADJENTA to linagliptin. I have also changed her isosorbide mononitrate and busPIRone. Additionally, I am having her maintain her sertraline, Fluticasone-Salmeterol, atorvastatin, pantoprazole, albuterol, hydrALAZINE, ergocalciferol, Iron, furosemide, carvedilol, fenofibrate, pramipexole, and glipiZIDE.  Meds ordered this encounter  Medications  . isosorbide mononitrate (IMDUR) 30 MG 24 hr tablet    Sig: Take 1 tablet (30 mg total) by mouth daily.    Dispense:  30 tablet    Refill:  2  . busPIRone (BUSPAR) 15 MG tablet    Sig: Take 1 tablet (15 mg total) by mouth 3 (three) times daily.    Dispense:  60 tablet    Refill:  1  . glipiZIDE (GLUCOTROL) 5 MG tablet    Sig: TAKE TWO TABLETS BY MOUTH TWICE DAILY AS DIRECTED    Dispense:  120 tablet    Refill:  1    PT NEEDS FOLLOW UP APPT FOR FURTHER REFILLS. LAST REFILL.  Marland Kitchen linagliptin (TRADJENTA) 5 MG TABS tablet    Sig: Take 1 tablet (5 mg total) by mouth daily.    Dispense:  30 tablet    Refill:  0    Problem List Items Addressed This Visit    None    Visit Diagnoses    Anxiety disorder, unspecified anxiety disorder type    -  Primary    Relevant Medications    busPIRone (BUSPAR) 15 MG tablet    Systolic congestive heart failure, unspecified congestive heart failure chronicity        Relevant Medications    isosorbide mononitrate (IMDUR) 30 MG 24 hr tablet    DM (diabetes mellitus) type II uncontrolled, periph vascular disorder        Relevant Medications    isosorbide mononitrate (IMDUR) 30 MG 24 hr tablet    glipiZIDE (GLUCOTROL) 5 MG tablet    linagliptin (TRADJENTA) 5 MG TABS tablet    Other Relevant Orders    Hemoglobin A1c    Microalbumin / creatinine urine ratio    Hyperlipidemia        Relevant Medications    isosorbide mononitrate (IMDUR) 30 MG 24 hr tablet    Other Relevant Orders    Lipid panel     Comp Met (CMET)    Microalbumin / creatinine urine ratio    Preventative health care        Relevant Orders    Hepatitis C antibody       Follow-up: Return in about 6 months (around 06/09/2015), or if symptoms worsen or fail to improve, for hypertension, hyperlipidemia, diabetes II.  Garnet Koyanagi, DO

## 2014-12-10 NOTE — Progress Notes (Signed)
Pre visit review using our clinic review tool, if applicable. No additional management support is needed unless otherwise documented below in the visit note. 

## 2014-12-11 LAB — HEPATITIS C ANTIBODY: HCV Ab: NEGATIVE

## 2014-12-23 DIAGNOSIS — M47896 Other spondylosis, lumbar region: Secondary | ICD-10-CM | POA: Diagnosis not present

## 2014-12-23 DIAGNOSIS — M4326 Fusion of spine, lumbar region: Secondary | ICD-10-CM | POA: Diagnosis not present

## 2014-12-27 ENCOUNTER — Other Ambulatory Visit: Payer: Self-pay | Admitting: *Deleted

## 2014-12-27 ENCOUNTER — Telehealth: Payer: Self-pay | Admitting: *Deleted

## 2014-12-27 DIAGNOSIS — IMO0002 Reserved for concepts with insufficient information to code with codable children: Secondary | ICD-10-CM

## 2014-12-27 DIAGNOSIS — E1151 Type 2 diabetes mellitus with diabetic peripheral angiopathy without gangrene: Secondary | ICD-10-CM

## 2014-12-27 DIAGNOSIS — E1165 Type 2 diabetes mellitus with hyperglycemia: Secondary | ICD-10-CM

## 2014-12-27 MED ORDER — GLIPIZIDE 10 MG PO TABS: 20.0000 mg | ORAL_TABLET | Freq: Two times a day (BID) | ORAL | Status: DC

## 2014-12-27 NOTE — Telephone Encounter (Signed)
Pt called stating her blood sugar has been running in the 400's every day. The glipizide doesn't seem to be helping. She just started back taking the Tradjenta (she couldn't afford it) today. Pt stated she just checked her b/s and it is 435. Please advise.

## 2014-12-27 NOTE — Telephone Encounter (Signed)
Called pt and advised her per Dr Arman Filter message. Pt voiced understanding. Sent rx to pharmacy.

## 2014-12-27 NOTE — Telephone Encounter (Signed)
Pt was lost for f/u - last visit 02/2014 when her sugars were extremely well controlled. We can increase the Glipizide to 10 mg bid for now and advise her to stay well hydrated, but I will need to see her for further instructions... Let;s schedule her early next week (not Monday).

## 2014-12-30 NOTE — Telephone Encounter (Signed)
Opened encounter in error  

## 2014-12-31 ENCOUNTER — Encounter: Payer: Self-pay | Admitting: Internal Medicine

## 2014-12-31 ENCOUNTER — Ambulatory Visit (INDEPENDENT_AMBULATORY_CARE_PROVIDER_SITE_OTHER): Payer: Medicare Other | Admitting: Internal Medicine

## 2014-12-31 VITALS — BP 118/72 | HR 96 | Temp 98.1°F | Resp 14 | Wt 272.8 lb

## 2014-12-31 DIAGNOSIS — E1165 Type 2 diabetes mellitus with hyperglycemia: Secondary | ICD-10-CM | POA: Insufficient documentation

## 2014-12-31 DIAGNOSIS — IMO0002 Reserved for concepts with insufficient information to code with codable children: Secondary | ICD-10-CM | POA: Insufficient documentation

## 2014-12-31 DIAGNOSIS — E1121 Type 2 diabetes mellitus with diabetic nephropathy: Secondary | ICD-10-CM | POA: Diagnosis not present

## 2014-12-31 DIAGNOSIS — E1151 Type 2 diabetes mellitus with diabetic peripheral angiopathy without gangrene: Secondary | ICD-10-CM | POA: Insufficient documentation

## 2014-12-31 MED ORDER — INSULIN PEN NEEDLE 32G X 4 MM MISC: Status: DC

## 2014-12-31 MED ORDER — INSULIN GLARGINE 100 UNIT/ML SOLOSTAR PEN: 14.0000 [IU] | PEN_INJECTOR | Freq: Every day | SUBCUTANEOUS | Status: DC

## 2014-12-31 NOTE — Progress Notes (Signed)
Patient ID: Erin [name], female   DOB: [birth year], 69 y.o.   MRN: [id number]  HPI: Erin [name] is a 69 y.o.-year-old female, returning for f/u for DM2, dx 2013, insulin-dependent, uncontrolled, with complications (CKD).   Last visit 1 year ago! She called 4 days ago with high sugars. I advised her to increase Glipizide and scheduled today's appt.  Last hemoglobin A1c was: Lab Results  Component Value Date   HGBA1C 9.0* 12/10/2014   HGBA1C 5.8 01/11/2014   HGBA1C 6.8* 08/02/2013   Pt is on a regimen of: - Glipizide 5 >> 20 mg in am and 5 >> 20 in pm She had to stop Tradjenta 5 mg for 1 year b/c price >> restarted now but still very expensive.  Had to stop Metformin 2/2 AKI after her back sx.  Pt checks her sugars 1-2 a day (no log/meter) - much higher: - am: 110-160 >> 110-115 (highest 150) >> 117-148 >> 100-140 >> 120-140 >> >200 - 2h after b'fast: 79-140 >> <140 >> n/c >> 300s - before lunch: 124-200 >> 100-115 >> n/c - 2h after lunch: sometimes checking >> n/c >> 137-200 >> 150-170s >> 140s-150s >> 300s-400 - before dinner: 120-200 >> 130s >> 148, 189 >> n/c - 2h after dinner: up to 270 >> 130-140 >> 165, 200 >> n/c >> 1h after dinner: 180-200 >> 200-300 - bedtime: 95-126 >> n/c  No lows. Lowest sugar was 90s; she has hypoglycemia awareness at 70.  Highest sugar was 200s >> 400  - + CKD, last BUN/creatinine:  GFR 37. Lab Results  Component Value Date   BUN 33* 12/10/2014   CREATININE 1.48* 12/10/2014  11/30/2013 [facility] - Cr 1.8  On Lisinopril. - last set of lipids: Lab Results  Component Value Date   CHOL 123 12/10/2014   HDL 39.40 12/10/2014   LDLCALC 46 12/10/2014   LDLDIRECT 62.0 07/18/2014   TRIG 186.0* 12/10/2014   CHOLHDL 3 12/10/2014  On Lipitor. - last eye exam was in 01/25/2014. No DR. Cataract >> had R eye surgery, had the cataract sx. - + numbness and tingling in her  big toe, not feet. She has swelling in feet 2/2 heat.   She  also has a history of HL, asthma, depression, vit D def.   I reviewed pt's medications, allergies, PMH, social hx, family hx, and changes were documented in the history of present illness. Otherwise, unchanged from my initial visit note.  ROS: Constitutional:+  weight gain, + fatigue, no subjective hyperthermia/hypothermia, + poor sleep, + excessive urination and nocturia Eyes: + blurry vision, no xerophthalmia ENT: no sore throat, no nodules palpated in throat, no dysphagia/odynophagia, no hoarseness Cardiovascular: no CP/+ SOB/no palpitations/+ leg swelling Respiratory: + cough/+ SOB/+ wheezing Gastrointestinal: no N /no V/no D/no C  Musculoskeletal: no muscle aches /+ joint aches Skin: no rashes Neurological: no tremors/numbness/tingling/dizziness  PE: BP 118/72 mmHg  Pulse 96  Temp(Src) 98.1 F (36.7 C) (Oral)  Resp 14  Wt 272 lb 12.8 oz (123.741 kg)  SpO2 94% Wt Readings from Last 3 Encounters:  12/31/14 272 lb 12.8 oz (123.741 kg)  12/10/14 271 lb 9.6 oz (123.197 kg)  07/10/14 260 lb (117.935 kg)   Constitutional: obese, in NAD Eyes: PERRLA, EOMI, no exophthalmos ENT: moist mucous membranes, no thyromegaly, no cervical lymphadenopathy Cardiovascular: RRR, No MRG, + marked pitting edema bilat. Respiratory: CTA B Gastrointestinal: abdomen soft, NT, ND, BS+ Musculoskeletal: no deformities, strength intact in all 4 Skin: moist, warm, no  rashes  ASSESSMENT: 1. DM2, insulin-dependent, now controlled, with complications - CKD  PLAN:  1. Patient with long-standing, previously well controlled diabetes, now with terrible control. She returns after a long absence. No meter, no log.  - All sugars are high, >200 - explained that we need basal insulin >> will start Lantus >> demonstrated correct  inj techniques - I suggested to:  Patient Instructions  Please decrease Glipizide to 10 mg 2x a day. Stop Tradjenta when you run out.  Start Lantus 14 units at bedtime. Increase by 4  units in 3 days if the sugars in am are not <150.  When injecting insulin:  Inject in the abdomen  Rotate the injection sites around the belly button  Change needle for each injection  Keep needle in for 10 sec after last unit of insulin in  Keep the insulin in use out of the fridge  Please let me know if the sugars are consistently <80 or >200.   - strongly advised her to improve her diet  - continue checking sugars at different times of the day - check 2 times a day, rotating checks  - Bring log at next visit >> given new log - UTD with eye exams - reviewed last hbA1c >> much increased! - Return to clinic in 1.5 mo with sugar log

## 2014-12-31 NOTE — Patient Instructions (Signed)
Please decrease Glipizide to 10 mg 2x a day. Stop Tradjenta when you run out.  Start Lantus 14 units at bedtime. Increase by 4 units in 3 days if the sugars in am are not <150.  When injecting insulin:  Inject in the abdomen  Rotate the injection sites around the belly button  Change needle for each injection  Keep needle in for 10 sec after last unit of insulin in  Keep the insulin in use out of the fridge  Please let me know if the sugars are consistently <80 or >200.

## 2015-01-03 DIAGNOSIS — N183 Chronic kidney disease, stage 3 (moderate): Secondary | ICD-10-CM | POA: Diagnosis not present

## 2015-01-06 DIAGNOSIS — I129 Hypertensive chronic kidney disease with stage 1 through stage 4 chronic kidney disease, or unspecified chronic kidney disease: Secondary | ICD-10-CM | POA: Diagnosis not present

## 2015-01-06 DIAGNOSIS — D631 Anemia in chronic kidney disease: Secondary | ICD-10-CM | POA: Diagnosis not present

## 2015-01-06 DIAGNOSIS — E559 Vitamin D deficiency, unspecified: Secondary | ICD-10-CM | POA: Diagnosis not present

## 2015-01-06 DIAGNOSIS — N183 Chronic kidney disease, stage 3 (moderate): Secondary | ICD-10-CM | POA: Diagnosis not present

## 2015-01-06 DIAGNOSIS — E889 Metabolic disorder, unspecified: Secondary | ICD-10-CM | POA: Diagnosis not present

## 2015-01-06 DIAGNOSIS — E1122 Type 2 diabetes mellitus with diabetic chronic kidney disease: Secondary | ICD-10-CM | POA: Diagnosis not present

## 2015-01-06 NOTE — Progress Notes (Signed)
HPI: FU dyspnea/CHF. Patient was admitted in [facility] in August 2015 with malignant hypertension, dyspnea and chest pain. She had acute renal failure. Cardiac markers were negative. Echocardiogram showed ejection fraction 55-60% and mild pulmonary hypertension. VQ scan negative. Echocardiogram December 2015 showed normal LV function and mild left atrial enlargement. Nuclear study March 2016 showed ejection fraction 76% and normal perfusion. Since last seen, She does have some dyspnea on exertion. No orthopnea or PND. Occasional mild pedal edema particularly with increased sodium intake. Occasional pain in her chest when she moves certain ways.  Current Outpatient Prescriptions  Medication Sig Dispense Refill  . albuterol (PROVENTIL) (2.5 MG/3ML) 0.083% nebulizer solution Take 2.5 mg by nebulization every 6 (six) hours as needed for wheezing or shortness of breath.    Marland Kitchen atorvastatin (LIPITOR) 40 MG tablet TAKE ONE TABLET BY MOUTH ONCE DAILY 30 tablet 5  . azithromycin (ZITHROMAX) 250 MG tablet Take 250 mg by mouth daily.    . busPIRone (BUSPAR) 15 MG tablet Take 1 tablet (15 mg total) by mouth 3 (three) times daily. 60 tablet 1  . carvedilol (COREG) 6.25 MG tablet Take 1 tablet (6.25 mg total) by mouth every 12 (twelve) hours. 180 tablet 3  . ergocalciferol (VITAMIN D2) 50000 UNITS capsule Take 50,000 Units by mouth once a week.    . fenofibrate 160 MG tablet Take 1 tablet (160 mg total) by mouth daily. 90 tablet 1  . Ferrous Sulfate (IRON) 325 (65 FE) MG TABS Take 1 tablet by mouth 2 (two) times daily.    . Fluticasone-Salmeterol (ADVAIR DISKUS) 250-50 MCG/DOSE AEPB Inhale 1 puff into the lungs 2 (two) times daily. 1 each 3  . furosemide (LASIX) 20 MG tablet Take 3 tablets (60 mg total) by mouth daily. 270 tablet 3  . glipiZIDE (GLUCOTROL) 5 MG tablet TAKE TWO TABLETS BY MOUTH TWICE DAILY AS DIRECTED 120 tablet 1  . hydrALAZINE (APRESOLINE) 50 MG tablet Take 1 tablet (50 mg total) by mouth  3 (three) times daily. 90 tablet 5  . Insulin Glargine (LANTUS SOLOSTAR) 100 UNIT/ML Solostar Pen Inject 14 Units into the skin daily at 10 pm. 5 pen 2  . Insulin Pen Needle 32G X 4 MM MISC Use 1x a day 100 each 11  . isosorbide mononitrate (IMDUR) 30 MG 24 hr tablet Take 1 tablet (30 mg total) by mouth daily. 30 tablet 2  . pantoprazole (PROTONIX) 40 MG tablet Take 1 tablet (40 mg total) by mouth daily. (Patient taking differently: Take 40 mg by mouth 2 (two) times daily. ) 30 tablet 11  . pramipexole (MIRAPEX) 1 MG tablet Take 1 tablet (1 mg total) by mouth at bedtime. 30 tablet 5  . sertraline (ZOLOFT) 50 MG tablet Take 1 tablet (50 mg total) by mouth daily. (Patient taking differently: Take 75 mg by mouth daily. ) 90 tablet 3   No current facility-administered medications for this visit.     Past Medical History  Diagnosis Date  . Depression   . Hyperlipidemia   . Chronic kidney disease   . CHF (congestive heart failure) [facility])     hospital 11/2013-- [place]  . Diabetes mellitus ([place])   . Hypertension   . Asthma   . GERD (gastroesophageal reflux disease)     Past Surgical History  Procedure Laterality Date  . Cholecystectomy  2002  . Tubal ligation    . Tonsillectomy    . Spine surgery  aug 2015  [facility]    Social History   Social History  . Marital Status: Widowed    Spouse Name: N/A  . Number of Children: 4  . Years of Education: N/A   Occupational History  . DELI-MANAGER @ [employer] History Main Topics  . Smoking status: Former Smoker -- 1.50 packs/day for 20 years    Types: Cigarettes    Quit date: 08/02/1984  . Smokeless tobacco: Never Used  . Alcohol Use: No  . Drug Use: No  . Sexual Activity:    Partners: Male   Other Topics Concern  . Not on file   Social History Narrative   NO REG EXERCISE   Caffeine use: daily    ROS: no fevers or chills, productive cough, hemoptysis, dysphasia, odynophagia, melena, hematochezia,  dysuria, hematuria, rash, seizure activity, orthopnea, PND, claudication. Remaining systems are negative.  Physical Exam: Well-developed obese in no acute distress.  Skin is warm and dry.  HEENT is normal.  Neck is supple.  Chest is clear to auscultation with normal expansion.  Cardiovascular exam is regular rate and rhythm.  Abdominal exam nontender or distended. No masses palpated. Extremities show 1+ edema. neuro grossly intact  ECG Sinus rhythm at a rate of 87. Right bundle branch block. Left anterior fascicular block. Cannot rule out prior anterior infarct.

## 2015-01-07 DIAGNOSIS — R0600 Dyspnea, unspecified: Secondary | ICD-10-CM | POA: Diagnosis not present

## 2015-01-07 DIAGNOSIS — G4733 Obstructive sleep apnea (adult) (pediatric): Secondary | ICD-10-CM | POA: Diagnosis not present

## 2015-01-07 DIAGNOSIS — J310 Chronic rhinitis: Secondary | ICD-10-CM | POA: Diagnosis not present

## 2015-01-07 DIAGNOSIS — J4520 Mild intermittent asthma, uncomplicated: Secondary | ICD-10-CM | POA: Diagnosis not present

## 2015-01-08 ENCOUNTER — Other Ambulatory Visit: Payer: Self-pay | Admitting: Family Medicine

## 2015-01-13 ENCOUNTER — Ambulatory Visit (INDEPENDENT_AMBULATORY_CARE_PROVIDER_SITE_OTHER): Payer: Medicare Other | Admitting: Cardiology

## 2015-01-13 ENCOUNTER — Encounter: Payer: Self-pay | Admitting: Cardiology

## 2015-01-13 VITALS — BP 152/56 | HR 87 | Ht 64.0 in | Wt 271.0 lb

## 2015-01-13 DIAGNOSIS — I5032 Chronic diastolic (congestive) heart failure: Secondary | ICD-10-CM

## 2015-01-13 DIAGNOSIS — R0609 Other forms of dyspnea: Secondary | ICD-10-CM

## 2015-01-13 DIAGNOSIS — I10 Essential (primary) hypertension: Secondary | ICD-10-CM

## 2015-01-13 DIAGNOSIS — R0600 Dyspnea, unspecified: Secondary | ICD-10-CM

## 2015-01-13 NOTE — Assessment & Plan Note (Signed)
This is most likely multifactorial including obesity hypoventilation syndrome, obstructive sleep apnea, deconditioning and pulmonary venous hypertension from diastolic congestive heart failure. Continue present dose of Lasix. She is followed by pulmonary in [place]. Her sleep apnea is being treated.

## 2015-01-13 NOTE — Assessment & Plan Note (Signed)
Continue statin. 

## 2015-01-13 NOTE — Patient Instructions (Signed)
Medication Instructions:   NO CHANGE  Follow-Up:  Your physician wants you to follow-up in: 6 MONTHS WITH DR CRENSHAW You will receive a reminder letter in the mail two months in advance. If you don't receive a letter, please call our office to schedule the follow-up appointment.      

## 2015-01-13 NOTE — Assessment & Plan Note (Signed)
Blood pressure is mildly elevated today. However she states typically controlled. We will follow and increase carvedilol if needed.

## 2015-01-13 NOTE — Assessment & Plan Note (Signed)
Patient counseled on weight loss. 

## 2015-01-13 NOTE — Assessment & Plan Note (Addendum)
Chronic diastolic congestive heart failure. Continue present dose of Lasix. Potassium and renal function is monitored by primary care. We discussed low-sodium diet.

## 2015-01-20 ENCOUNTER — Ambulatory Visit: Payer: Self-pay | Admitting: Internal Medicine

## 2015-01-22 ENCOUNTER — Telehealth: Payer: Self-pay | Admitting: Internal Medicine

## 2015-01-22 NOTE — Telephone Encounter (Signed)
Pt having swelling in feet and legs the swelling is not going down

## 2015-01-22 NOTE — Telephone Encounter (Signed)
Please read message below and advise.  

## 2015-01-22 NOTE — Telephone Encounter (Signed)
Does she feel this is from insulin? Did it start after we started Lantus? If no, then she should contact PCP>

## 2015-01-22 NOTE — Telephone Encounter (Signed)
Called pt and lvm advising her per Dr Arman Filter message below. Advised pt to return call.

## 2015-01-31 ENCOUNTER — Other Ambulatory Visit: Payer: Self-pay | Admitting: Family Medicine

## 2015-02-04 DIAGNOSIS — H35033 Hypertensive retinopathy, bilateral: Secondary | ICD-10-CM | POA: Diagnosis not present

## 2015-02-09 ENCOUNTER — Other Ambulatory Visit: Payer: Self-pay | Admitting: Family Medicine

## 2015-02-11 ENCOUNTER — Other Ambulatory Visit: Payer: Self-pay | Admitting: Family Medicine

## 2015-02-17 DIAGNOSIS — J310 Chronic rhinitis: Secondary | ICD-10-CM | POA: Diagnosis not present

## 2015-02-18 ENCOUNTER — Ambulatory Visit: Payer: Self-pay | Admitting: Internal Medicine

## 2015-03-02 ENCOUNTER — Other Ambulatory Visit: Payer: Self-pay | Admitting: Family Medicine

## 2015-03-03 DIAGNOSIS — G4733 Obstructive sleep apnea (adult) (pediatric): Secondary | ICD-10-CM | POA: Diagnosis not present

## 2015-03-03 DIAGNOSIS — R0600 Dyspnea, unspecified: Secondary | ICD-10-CM | POA: Diagnosis not present

## 2015-03-03 DIAGNOSIS — J4531 Mild persistent asthma with (acute) exacerbation: Secondary | ICD-10-CM | POA: Diagnosis not present

## 2015-03-03 DIAGNOSIS — J310 Chronic rhinitis: Secondary | ICD-10-CM | POA: Diagnosis not present

## 2015-04-01 DIAGNOSIS — M9080 Osteopathy in diseases classified elsewhere, unspecified site: Secondary | ICD-10-CM | POA: Diagnosis not present

## 2015-04-01 DIAGNOSIS — Z794 Long term (current) use of insulin: Secondary | ICD-10-CM | POA: Diagnosis not present

## 2015-04-01 DIAGNOSIS — D649 Anemia, unspecified: Secondary | ICD-10-CM | POA: Diagnosis not present

## 2015-04-01 DIAGNOSIS — I129 Hypertensive chronic kidney disease with stage 1 through stage 4 chronic kidney disease, or unspecified chronic kidney disease: Secondary | ICD-10-CM | POA: Diagnosis not present

## 2015-04-01 DIAGNOSIS — N183 Chronic kidney disease, stage 3 (moderate): Secondary | ICD-10-CM | POA: Diagnosis not present

## 2015-04-01 DIAGNOSIS — E1122 Type 2 diabetes mellitus with diabetic chronic kidney disease: Secondary | ICD-10-CM | POA: Diagnosis not present

## 2015-04-01 DIAGNOSIS — E889 Metabolic disorder, unspecified: Secondary | ICD-10-CM | POA: Diagnosis not present

## 2015-04-01 DIAGNOSIS — E559 Vitamin D deficiency, unspecified: Secondary | ICD-10-CM | POA: Diagnosis not present

## 2015-04-09 ENCOUNTER — Telehealth: Payer: Self-pay | Admitting: Internal Medicine

## 2015-04-09 NOTE — Telephone Encounter (Signed)
Pt would like to speak with Larene Beach about sugars pt states her BS this AM is 340 and has been ranging around 300-400 for a couple of weeks.

## 2015-04-09 NOTE — Telephone Encounter (Signed)
Called pt and advised her per Dr Luisa Dago message below. Pt voiced understanding.

## 2015-04-09 NOTE — Telephone Encounter (Signed)
Returned pt's call. Pt stated that she had been sick. Was on prednisone, no longer taking it now. Pt stated her fasting this AM was 340. Tues, 330 fasting, evening 300's. Mon, after lunch 290, evening 300. Pt stated that she has been eating a lot of breads and drinking a lot of milk. Advised pt that those two are probably part of the problem with her sugar increase over the past 2 weeks. Pt wanted to know what else should she do? Pt has an appt on Jan 9th. Please advise of any changes.

## 2015-04-09 NOTE — Telephone Encounter (Signed)
Increase the insulin (Lantus) by 5 units and keep increasing every 3 days if sugars are not <150 in am (if no lows later on).

## 2015-04-14 ENCOUNTER — Ambulatory Visit: Payer: Self-pay | Admitting: Internal Medicine

## 2015-04-24 DIAGNOSIS — M545 Low back pain: Secondary | ICD-10-CM | POA: Diagnosis not present

## 2015-04-24 DIAGNOSIS — M67972 Unspecified disorder of synovium and tendon, left ankle and foot: Secondary | ICD-10-CM | POA: Diagnosis not present

## 2015-04-24 DIAGNOSIS — M25572 Pain in left ankle and joints of left foot: Secondary | ICD-10-CM | POA: Diagnosis not present

## 2015-04-24 DIAGNOSIS — M4180 Other forms of scoliosis, site unspecified: Secondary | ICD-10-CM | POA: Diagnosis not present

## 2015-05-01 ENCOUNTER — Ambulatory Visit (INDEPENDENT_AMBULATORY_CARE_PROVIDER_SITE_OTHER): Payer: Medicare Other | Admitting: Internal Medicine

## 2015-05-01 ENCOUNTER — Other Ambulatory Visit: Payer: Self-pay | Admitting: Internal Medicine

## 2015-05-01 ENCOUNTER — Other Ambulatory Visit (INDEPENDENT_AMBULATORY_CARE_PROVIDER_SITE_OTHER): Payer: Medicare Other | Admitting: *Deleted

## 2015-05-01 ENCOUNTER — Encounter: Payer: Self-pay | Admitting: Internal Medicine

## 2015-05-01 VITALS — BP 120/68 | HR 89 | Temp 97.3°F | Resp 14 | Wt 273.0 lb

## 2015-05-01 DIAGNOSIS — E119 Type 2 diabetes mellitus without complications: Secondary | ICD-10-CM

## 2015-05-01 DIAGNOSIS — Z794 Long term (current) use of insulin: Secondary | ICD-10-CM

## 2015-05-01 LAB — POCT GLYCOSYLATED HEMOGLOBIN (HGB A1C): Hemoglobin A1C: 8.4

## 2015-05-01 NOTE — Patient Instructions (Signed)
Please continue: - Glipizide 10 mg 2x a day before meals  Please increase:  - Lantus by 4 units every 4 days until sugars in am are <150 (start with 24 units)  Please let me know if the sugars are consistently <80 or >200.  Please come back for a follow-up appointment in 3 months.

## 2015-05-01 NOTE — Progress Notes (Signed)
Patient ID: Erin [name], female   DOB: 09-25-1945, 70 y.o.   MRN: [id number]  HPI: Erin [name] is a 70 y.o.-year-old female, returning for f/u for DM2, dx 2013, insulin-dependent, uncontrolled, with complications (CKD). Last visit 4 mo ago, despite advice to return in 1.5 mo.  She took a Prednisone taper 1 mo ago. Sugars much higher then.  Last hemoglobin A1c was: Lab Results  Component Value Date   HGBA1C 9.0* 12/10/2014   HGBA1C 5.8 01/11/2014   HGBA1C 6.8* 08/02/2013   Pt is on a regimen of: - Glipizide 10 mg bid - Lantus 24 units at bedtime (last increase in dose >1 week ago) She had to stop Tradjenta 5 mg for 1 year b/c price >> restarted now but still very expensive.  Had to stop Metformin 2/2 AKI after her back sx.  Pt checks her sugars 1-2 a day: - am: 110-160 >> 110-115 (highest 150) >> 117-148 >> 100-140 >> 120-140 >> >200 >> 197-250 - 2h after b'fast: 79-140 >> <140 >> n/c >> 300s >> 185-220 - before lunch: 124-200 >> 100-115 >> n/c >> 200-244 - 2h after lunch: sometimes checking >> n/c >> 137-200 >> 150-170s >> 140s-150s >> 300s-400 >> 207-244 - before dinner: 120-200 >> 130s >> 148, 189 >> n/c >> 208 - 2h after dinner: up to 270 >> 130-140 >> 165, 200 >> n/c >> 1h after dinner: 180-200 >> 200-300 >> 187-259, 280 - bedtime: 95-126 >> n/c  No lows. Lowest sugar was 90s; she has hypoglycemia awareness at 70.  Highest sugar was 200s >> 400  - + CKD, last BUN/creatinine:  GFR 37. Lab Results  Component Value Date   BUN 33* 12/10/2014   CREATININE 1.48* 12/10/2014  11/30/2013 [facility] - Cr 1.8  On Lisinopril. - last set of lipids: Lab Results  Component Value Date   CHOL 123 12/10/2014   HDL 39.40 12/10/2014   LDLCALC 46 12/10/2014   LDLDIRECT 62.0 07/18/2014   TRIG 186.0* 12/10/2014   CHOLHDL 3 12/10/2014  On Lipitor. - last eye exam was in ~06/2014. No DR. Cataract >> had R eye surgery, had the cataract sx. - + numbness and tingling in  her  big toe, not feet. She has swelling in feet 2/2 heat.   She also has a history of HL, asthma, depression, vit D def.   I reviewed pt's medications, allergies, PMH, social hx, family hx, and changes were documented in the history of present illness. Otherwise, unchanged from my initial visit note.  ROS: Constitutional: + weight gain, no fatigue, no subjective hyperthermia/hypothermia Eyes: no blurry vision, no xerophthalmia ENT: no sore throat, no nodules palpated in throat, no dysphagia/odynophagia, no hoarseness Cardiovascular: no CP/SOB/palpitations/leg swelling Respiratory: no cough/SOB Gastrointestinal: no N/V/D/C Musculoskeletal: no muscle/joint aches Skin: no rashes Neurological: no tremors/numbness/tingling/dizziness  PE: BP 120/68 mmHg  Pulse 89  Temp(Src) 97.3 F (36.3 C) (Oral)  Resp 14  Wt 273 lb (123.832 kg)  SpO2 94% Body mass index is 46.84 kg/(m^2). Wt Readings from Last 3 Encounters:  05/01/15 273 lb (123.832 kg)  01/13/15 271 lb (122.925 kg)  12/31/14 272 lb 12.8 oz (123.741 kg)   Constitutional: obese, in NAD Eyes: PERRLA, EOMI, no exophthalmos ENT: moist mucous membranes, no thyromegaly, no cervical lymphadenopathy Cardiovascular: RRR, No MRG, + marked pitting edema bilat. Respiratory: CTA B Gastrointestinal: abdomen soft, NT, ND, BS+ Musculoskeletal: no deformities, strength intact in all 4 Skin: moist, warm, no rashes  ASSESSMENT: 1. DM2, insulin-dependent, uncontrolled,  with complications - CKD  PLAN:  1. Patient with long-standing, previously well controlled diabetes, now with terrible control, which has improved some after addition of long acting insulin at the previous visit 4 mo ago, but I would have expected more improvement. Upon questioninging, she is releasing the injection button while needle is still in >> advised to only release when needle out. She will start doing this correctly. - I advised her to:  Patient Instructions  Please  continue: - Glipizide 10 mg 2x a day before meals  Please increase:  - Lantus by 4 units every 4 days until sugars in am are <150 (start with 24 units)  Please let me know if the sugars are consistently <80 or >200.  Please come back for a follow-up appointment in 3 months.   - continue checking sugars at different times of the day - check 2 times a day, rotating checks  - given new log - UTD with eye exams - check HbA1c >> 8.4% (better, but still high) - Return to clinic in 3 mo with sugar log

## 2015-05-02 DIAGNOSIS — M9080 Osteopathy in diseases classified elsewhere, unspecified site: Secondary | ICD-10-CM | POA: Diagnosis not present

## 2015-05-02 DIAGNOSIS — E559 Vitamin D deficiency, unspecified: Secondary | ICD-10-CM | POA: Diagnosis not present

## 2015-05-02 DIAGNOSIS — N183 Chronic kidney disease, stage 3 (moderate): Secondary | ICD-10-CM | POA: Diagnosis not present

## 2015-05-02 DIAGNOSIS — D649 Anemia, unspecified: Secondary | ICD-10-CM | POA: Diagnosis not present

## 2015-05-02 DIAGNOSIS — E119 Type 2 diabetes mellitus without complications: Secondary | ICD-10-CM | POA: Diagnosis not present

## 2015-05-02 DIAGNOSIS — I129 Hypertensive chronic kidney disease with stage 1 through stage 4 chronic kidney disease, or unspecified chronic kidney disease: Secondary | ICD-10-CM | POA: Diagnosis not present

## 2015-05-02 DIAGNOSIS — M109 Gout, unspecified: Secondary | ICD-10-CM | POA: Diagnosis not present

## 2015-05-02 DIAGNOSIS — Z794 Long term (current) use of insulin: Secondary | ICD-10-CM | POA: Diagnosis not present

## 2015-05-02 DIAGNOSIS — E889 Metabolic disorder, unspecified: Secondary | ICD-10-CM | POA: Diagnosis not present

## 2015-05-02 DIAGNOSIS — Z6841 Body Mass Index (BMI) 40.0 and over, adult: Secondary | ICD-10-CM | POA: Diagnosis not present

## 2015-05-02 DIAGNOSIS — E1122 Type 2 diabetes mellitus with diabetic chronic kidney disease: Secondary | ICD-10-CM | POA: Diagnosis not present

## 2015-06-02 ENCOUNTER — Other Ambulatory Visit: Payer: Self-pay

## 2015-06-02 MED ORDER — ATORVASTATIN CALCIUM 40 MG PO TABS: 40.0000 mg | ORAL_TABLET | Freq: Every day | ORAL | Status: DC

## 2015-06-03 DIAGNOSIS — J4521 Mild intermittent asthma with (acute) exacerbation: Secondary | ICD-10-CM | POA: Diagnosis not present

## 2015-06-03 DIAGNOSIS — R5383 Other fatigue: Secondary | ICD-10-CM | POA: Diagnosis not present

## 2015-06-03 DIAGNOSIS — G4733 Obstructive sleep apnea (adult) (pediatric): Secondary | ICD-10-CM | POA: Diagnosis not present

## 2015-06-03 DIAGNOSIS — J310 Chronic rhinitis: Secondary | ICD-10-CM | POA: Diagnosis not present

## 2015-06-07 ENCOUNTER — Other Ambulatory Visit: Payer: Self-pay | Admitting: Family Medicine

## 2015-06-10 ENCOUNTER — Ambulatory Visit: Payer: Self-pay | Admitting: Family Medicine

## 2015-06-19 ENCOUNTER — Encounter: Payer: Self-pay | Admitting: Family Medicine

## 2015-06-19 ENCOUNTER — Ambulatory Visit (INDEPENDENT_AMBULATORY_CARE_PROVIDER_SITE_OTHER): Payer: Medicare Other | Admitting: Family Medicine

## 2015-06-19 ENCOUNTER — Ambulatory Visit (HOSPITAL_BASED_OUTPATIENT_CLINIC_OR_DEPARTMENT_OTHER)
Admission: RE | Admit: 2015-06-19 | Discharge: 2015-06-19 | Disposition: A | Discharge status: Home / Self Care | Payer: Medicare Other | Source: Ambulatory Visit | Attending: Family Medicine | Admitting: Family Medicine

## 2015-06-19 VITALS — BP 153/83 | HR 103 | Temp 98.0°F | Ht 64.0 in | Wt 276.2 lb

## 2015-06-19 DIAGNOSIS — I10 Essential (primary) hypertension: Secondary | ICD-10-CM

## 2015-06-19 DIAGNOSIS — E785 Hyperlipidemia, unspecified: Secondary | ICD-10-CM | POA: Diagnosis not present

## 2015-06-19 DIAGNOSIS — J4520 Mild intermittent asthma, uncomplicated: Secondary | ICD-10-CM

## 2015-06-19 DIAGNOSIS — J209 Acute bronchitis, unspecified: Secondary | ICD-10-CM

## 2015-06-19 DIAGNOSIS — M109 Gout, unspecified: Secondary | ICD-10-CM

## 2015-06-19 DIAGNOSIS — E1121 Type 2 diabetes mellitus with diabetic nephropathy: Secondary | ICD-10-CM

## 2015-06-19 DIAGNOSIS — J42 Unspecified chronic bronchitis: Secondary | ICD-10-CM

## 2015-06-19 DIAGNOSIS — R05 Cough: Secondary | ICD-10-CM | POA: Diagnosis not present

## 2015-06-19 DIAGNOSIS — IMO0002 Reserved for concepts with insufficient information to code with codable children: Secondary | ICD-10-CM

## 2015-06-19 DIAGNOSIS — E1151 Type 2 diabetes mellitus with diabetic peripheral angiopathy without gangrene: Secondary | ICD-10-CM | POA: Diagnosis not present

## 2015-06-19 DIAGNOSIS — E1165 Type 2 diabetes mellitus with hyperglycemia: Secondary | ICD-10-CM

## 2015-06-19 MED ORDER — AZITHROMYCIN 250 MG PO TABS: ORAL_TABLET | ORAL | Status: DC

## 2015-06-19 MED ORDER — METHYLPREDNISOLONE ACETATE 80 MG/ML IJ SUSP
80.0000 mg | Freq: Once | INTRAMUSCULAR | Status: AC
Start: 2015-06-19 — End: 2015-06-19
  Administered 2015-06-19: 80 mg via INTRAMUSCULAR

## 2015-06-19 MED ORDER — IPRATROPIUM-ALBUTEROL 0.5-2.5 (3) MG/3ML IN SOLN: 3.0000 mL | Freq: Four times a day (QID) | RESPIRATORY_TRACT | Status: DC | PRN

## 2015-06-19 MED ORDER — IPRATROPIUM-ALBUTEROL 0.5-2.5 (3) MG/3ML IN SOLN
3.0000 mL | Freq: Once | RESPIRATORY_TRACT | Status: AC
  Administered 2015-06-19: 3 mL via RESPIRATORY_TRACT

## 2015-06-19 NOTE — Assessment & Plan Note (Signed)
con't lipitor check labs

## 2015-06-19 NOTE — Progress Notes (Signed)
Patient ID:  Erin [name], female    DOB: Mar 17, 1946  Age: 70 y.o. MRN: [id number]    Subjective:  Subjective HPI Erin [name] presents for  F/u dm, cholesterol and htn.  She saw her pulm and was dx with flu and bronchitis over 10 days ago.  She still can not get stuff up and is wheezing and coughing.  Still some chills--- ? Fevers.    Review of Systems  Constitutional: Positive for fever and chills.  HENT: Positive for congestion, postnasal drip, rhinorrhea and sinus pressure.   Respiratory: Positive for cough, chest tightness, shortness of breath and wheezing.   Cardiovascular: Negative for chest pain, palpitations and leg swelling.  Allergic/Immunologic: Negative for environmental allergies.    History Past Medical History  Diagnosis Date  . Depression   . Hyperlipidemia   . Chronic kidney disease   . CHF (congestive heart failure) [facility] [place])     hospital 11/2013-- [place]  . Diabetes mellitus ([place])   . Hypertension   . Asthma   . GERD (gastroesophageal reflux disease)     She has past surgical history that includes Cholecystectomy (2002); Tubal ligation; Tonsillectomy; and Spine surgery (aug 2015).   Her family history includes Alzheimer's disease in her mother; Arthritis in her brother; Asthma in her father; Cancer (age of onset: 46) in her father; Heart disease in her father, maternal uncle, maternal uncle, and maternal uncle; Heart disease (age of onset: 11) in her sister; Hyperlipidemia in her mother; Hypertension in her father and mother; Stomach cancer in her paternal grandfather; Stroke in her father and maternal uncle; Uterine cancer in her paternal grandmother.She reports that she quit smoking about 30 years ago. Her smoking use included Cigarettes. She has a 30 pack-year smoking history. She has never used smokeless tobacco. She reports that she does not drink alcohol or use illicit drugs.  Current Outpatient Prescriptions on File Prior to Visit  Medication Sig  Dispense Refill  . atorvastatin (LIPITOR) 40 MG tablet Take 1 tablet (40 mg total) by mouth daily. 90 tablet 0  . busPIRone (BUSPAR) 15 MG tablet TAKE ONE TABLET BY MOUTH THREE TIMES DAILY 60 tablet 0  . carvedilol (COREG) 6.25 MG tablet Take 1 tablet (6.25 mg total) by mouth every 12 (twelve) hours. 180 tablet 3  . ergocalciferol (VITAMIN D2) 50000 UNITS capsule Take 50,000 Units by mouth once a week.    . fenofibrate 160 MG tablet TAKE ONE TABLET BY MOUTH ONCE DAILY 90 tablet 1  . Ferrous Sulfate (IRON) 325 (65 FE) MG TABS Take 1 tablet by mouth 2 (two) times daily.    . Fluticasone Furoate-Vilanterol (BREO ELLIPTA) 200-25 MCG/INH AEPB Inhale 1 puff into the lungs daily.    . furosemide (LASIX) 20 MG tablet Take 3 tablets (60 mg total) by mouth daily. 270 tablet 3  . glipiZIDE (GLUCOTROL) 10 MG tablet TAKE TWO TABLETS BY MOUTH TWICE DAILY BEFORE MEAL(S) 120 tablet 2  . hydrALAZINE (APRESOLINE) 50 MG tablet TAKE ONE TABLET BY MOUTH THREE TIMES DAILY 90 tablet 1  . Insulin Glargine (LANTUS SOLOSTAR) 100 UNIT/ML Solostar Pen Inject 14 Units into the skin daily at 10 pm. (Patient taking differently: Inject 24 Units into the skin daily at 10 pm. ) 5 pen 2  . Insulin Pen Needle 32G X 4 MM MISC Use 1x a day 100 each 11  . isosorbide mononitrate (IMDUR) 30 MG 24 hr tablet Take 1 tablet (30 mg total) by mouth daily. 30 tablet 2  .  pantoprazole (PROTONIX) 40 MG tablet TAKE ONE TABLET BY MOUTH ONCE DAILY 30 tablet 6  . pramipexole (MIRAPEX) 1 MG tablet TAKE ONE TABLET BY MOUTH AT BEDTIME 30 tablet 5  . sertraline (ZOLOFT) 50 MG tablet Take 1 tablet (50 mg total) by mouth daily. (Patient taking differently: Take 75 mg by mouth daily. ) 90 tablet 3   No current facility-administered medications on file prior to visit.     Objective:  Objective Physical Exam  Constitutional: She is oriented to person, place, and time. She appears well-developed and well-nourished.  HENT:  Head: Normocephalic and  atraumatic.  Right Ear: External ear normal.  Left Ear: External ear normal.  Nose: Mucosal edema, rhinorrhea and sinus tenderness present. No nasal deformity. Right sinus exhibits maxillary sinus tenderness and frontal sinus tenderness. Left sinus exhibits maxillary sinus tenderness and frontal sinus tenderness.  Mouth/Throat: Oropharynx is clear and moist and mucous membranes are normal. No oropharyngeal exudate.  + PND + errythema  Eyes: Conjunctivae and EOM are normal. Right eye exhibits no discharge. Left eye exhibits no discharge.  Neck: Normal range of motion. Neck supple. No JVD present. Carotid bruit is not present. No thyromegaly present.  Cardiovascular: Normal rate, regular rhythm and normal heart sounds.   No murmur heard. Pulmonary/Chest: Effort normal. No respiratory distress. She has wheezes. She has no rales. She exhibits no tenderness.  Musculoskeletal: She exhibits no edema.  Lymphadenopathy:    She has no cervical adenopathy.  Neurological: She is alert and oriented to person, place, and time.  Skin: Skin is warm. She is diaphoretic.  Psychiatric: She has a normal mood and affect. Her behavior is normal.   BP 153/83 mmHg  Pulse 103  Temp(Src) 98 F (36.7 C) (Oral)  Ht '5\' 4"'  (1.626 m)  Wt 276 lb 3.2 oz (125.283 kg)  BMI 47.39 kg/m2  SpO2 94% Wt Readings from Last 3 Encounters:  06/19/15 276 lb 3.2 oz (125.283 kg)  05/01/15 273 lb (123.832 kg)  01/13/15 271 lb (122.925 kg)     Lab Results  Component Value Date   WBC 6.6 07/17/2014   HGB 10.4* 07/17/2014   HCT 31.2* 07/17/2014   PLT 208.0 07/17/2014   GLUCOSE 225* 12/10/2014   CHOL 123 12/10/2014   TRIG 186.0* 12/10/2014   HDL 39.40 12/10/2014   LDLDIRECT 62.0 07/18/2014   LDLCALC 46 12/10/2014   ALT 17 12/10/2014   AST 20 12/10/2014   NA 137 12/10/2014   K 4.1 12/10/2014   CL 102 12/10/2014   CREATININE 1.48* 12/10/2014   BUN 33* 12/10/2014   CO2 25 12/10/2014   TSH 2.29 05/17/2011   HGBA1C 8.4  05/01/2015   MICROALBUR 1.5 02/05/2014    No results found.   Assessment & Plan:  Plan I am having [name] start on azithromycin and ipratropium-albuterol. I am also having her maintain her sertraline, ergocalciferol, Iron, furosemide, carvedilol, isosorbide mononitrate, Insulin Glargine, Insulin Pen Needle, pramipexole, fenofibrate, pantoprazole, hydrALAZINE, fluticasone furoate-vilanterol, glipiZIDE, atorvastatin, busPIRone, allopurinol, and COLCRYS. We administered ipratropium-albuterol and methylPREDNISolone acetate.  Meds ordered this encounter  Medications  . allopurinol (ZYLOPRIM) 100 MG tablet    Sig: Take 1 tablet by mouth 2 (two) times daily.  Marland Kitchen COLCRYS 0.6 MG tablet    Sig: Take 1 tablet by mouth daily.  Marland Kitchen azithromycin (ZITHROMAX Z-PAK) 250 MG tablet    Sig: As directed    Dispense:  6 each    Refill:  0  . ipratropium-albuterol (DUONEB) 0.5-2.5 (3) MG/3ML  nebulizer solution 3 mL    Sig:   . methylPREDNISolone acetate (DEPO-MEDROL) injection 80 mg    Sig:   . ipratropium-albuterol (DUONEB) 0.5-2.5 (3) MG/3ML SOLN    Sig: Take 3 mLs by nebulization every 6 (six) hours as needed.    Dispense:  360 mL    Refill:  2    Problem List Items Addressed This Visit    Hyperlipidemia    con't lipitor check labs      Relevant Orders   Comp Met (CMET)   CBC with Differential/Platelet   Hemoglobin A1c   Lipid panel   POCT urinalysis dipstick   Microalbumin / creatinine urine ratio   Essential hypertension   Relevant Orders   Comp Met (CMET)   CBC with Differential/Platelet   Hemoglobin A1c   Lipid panel   POCT urinalysis dipstick   Microalbumin / creatinine urine ratio   COPD (chronic obstructive pulmonary disease) (HCC)    duoneb given in office with relief Depo medrol 80 mg im abx per orders F/u pulmonary      Relevant Medications   azithromycin (ZITHROMAX Z-PAK) 250 MG tablet   ipratropium-albuterol (DUONEB) 0.5-2.5 (3) MG/3ML nebulizer solution 3 mL  (Completed)   methylPREDNISolone acetate (DEPO-MEDROL) injection 80 mg (Completed)   ipratropium-albuterol (DUONEB) 0.5-2.5 (3) MG/3ML SOLN   Type 2 diabetes mellitus with diabetic nephropathy (HCC)    con't glipizide con't lantus Check labs       Other Visit Diagnoses    Gout without tophus, unspecified cause, unspecified chronicity, unspecified site    -  Primary    Relevant Orders    Uric acid    DM (diabetes mellitus) type II uncontrolled, periph vascular disorder (HCC)        Relevant Orders    Comp Met (CMET)    CBC with Differential/Platelet    Hemoglobin A1c    Lipid panel    POCT urinalysis dipstick    Microalbumin / creatinine urine ratio    Acute bronchitis, unspecified organism        Relevant Medications    azithromycin (ZITHROMAX Z-PAK) 250 MG tablet    ipratropium-albuterol (DUONEB) 0.5-2.5 (3) MG/3ML nebulizer solution 3 mL (Completed)    methylPREDNISolone acetate (DEPO-MEDROL) injection 80 mg (Completed)    Other Relevant Orders    DG Chest 2 View (Completed)    Asthma, mild intermittent, uncomplicated        Relevant Medications    ipratropium-albuterol (DUONEB) 0.5-2.5 (3) MG/3ML nebulizer solution 3 mL (Completed)    methylPREDNISolone acetate (DEPO-MEDROL) injection 80 mg (Completed)    ipratropium-albuterol (DUONEB) 0.5-2.5 (3) MG/3ML SOLN       Follow-up: Return in about 3 months (around 09/19/2015) for hypertension, hyperlipidemia, diabetes II.  Garnet Koyanagi, DO

## 2015-06-19 NOTE — Assessment & Plan Note (Signed)
con't glipizide con't lantus Check labs

## 2015-06-19 NOTE — Progress Notes (Signed)
Pre visit review using our clinic review tool, if applicable. No additional management support is needed unless otherwise documented below in the visit note. 

## 2015-06-19 NOTE — Assessment & Plan Note (Signed)
duoneb given in office with relief Depo medrol 80 mg im abx per orders F/u pulmonary

## 2015-06-19 NOTE — Patient Instructions (Addendum)
Get the xray tonight Please f/u with your pulmonary dr in [place]-- we will call you with the xray results    cute Bronchitis Bronchitis is inflammation of the airways that extend from the windpipe into the lungs (bronchi). The inflammation often causes mucus to develop. This leads to a cough, which is the most common symptom of bronchitis.  In acute bronchitis, the condition usually develops suddenly and goes away over time, usually in a couple weeks. Smoking, allergies, and asthma can make bronchitis worse. Repeated episodes of bronchitis may cause further lung problems.  CAUSES Acute bronchitis is most often caused by the same virus that causes a cold. The virus can spread from person to person (contagious) through coughing, sneezing, and touching contaminated objects. SIGNS AND SYMPTOMS   Cough.   Fever.   Coughing up mucus.   Body aches.   Chest congestion.   Chills.   Shortness of breath.   Sore throat.  DIAGNOSIS  Acute bronchitis is usually diagnosed through a physical exam. Your health care provider will also ask you questions about your medical history. Tests, such as chest X-rays, are sometimes done to rule out other conditions.  TREATMENT  Acute bronchitis usually goes away in a couple weeks. Oftentimes, no medical treatment is necessary. Medicines are sometimes given for relief of fever or cough. Antibiotic medicines are usually not needed but may be prescribed in certain situations. In some cases, an inhaler may be recommended to help reduce shortness of breath and control the cough. A cool mist vaporizer may also be used to help thin bronchial secretions and make it easier to clear the chest.  HOME CARE INSTRUCTIONS  Get plenty of rest.   Drink enough fluids to keep your urine clear or pale yellow (unless you have a medical condition that requires fluid restriction). Increasing fluids may help thin your respiratory secretions (sputum) and reduce chest  congestion, and it will prevent dehydration.   Take medicines only as directed by your health care provider.  If you were prescribed an antibiotic medicine, finish it all even if you start to feel better.  Avoid smoking and secondhand smoke. Exposure to cigarette smoke or irritating chemicals will make bronchitis worse. If you are a smoker, consider using nicotine gum or skin patches to help control withdrawal symptoms. Quitting smoking will help your lungs heal faster.   Reduce the chances of another bout of acute bronchitis by washing your hands frequently, avoiding people with cold symptoms, and trying not to touch your hands to your mouth, nose, or eyes.   Keep all follow-up visits as directed by your health care provider.  SEEK MEDICAL CARE IF: Your symptoms do not improve after 1 week of treatment.  SEEK IMMEDIATE MEDICAL CARE IF:  You develop an increased fever or chills.   You have chest pain.   You have severe shortness of breath.  You have bloody sputum.   You develop dehydration.  You faint or repeatedly feel like you are going to pass out.  You develop repeated vomiting.  You develop a severe headache. MAKE SURE YOU:   Understand these instructions.  Will watch your condition.  Will get help right away if you are not doing well or get worse.   This information is not intended to replace advice given to you by your health care provider. Make sure you discuss any questions you have with your health care provider.   Document Released: 04/29/2004 Document Revised: 04/12/2014 Document Reviewed: 09/12/2012 Elsevier Interactive Patient  Education 2016 [employer].

## 2015-06-24 ENCOUNTER — Other Ambulatory Visit: Payer: Self-pay | Admitting: Family Medicine

## 2015-06-24 DIAGNOSIS — J441 Chronic obstructive pulmonary disease with (acute) exacerbation: Secondary | ICD-10-CM

## 2015-06-24 MED ORDER — ALBUTEROL SULFATE (2.5 MG/3ML) 0.083% IN NEBU: 2.5000 mg | INHALATION_SOLUTION | Freq: Four times a day (QID) | RESPIRATORY_TRACT | Status: DC | PRN

## 2015-06-24 MED ORDER — IPRATROPIUM BROMIDE 0.02 % IN SOLN: 0.5000 mg | Freq: Four times a day (QID) | RESPIRATORY_TRACT | Status: DC

## 2015-07-01 DIAGNOSIS — J4521 Mild intermittent asthma with (acute) exacerbation: Secondary | ICD-10-CM | POA: Diagnosis not present

## 2015-07-01 DIAGNOSIS — R5383 Other fatigue: Secondary | ICD-10-CM | POA: Diagnosis not present

## 2015-07-01 DIAGNOSIS — G4733 Obstructive sleep apnea (adult) (pediatric): Secondary | ICD-10-CM | POA: Diagnosis not present

## 2015-07-01 DIAGNOSIS — J310 Chronic rhinitis: Secondary | ICD-10-CM | POA: Diagnosis not present

## 2015-07-17 ENCOUNTER — Other Ambulatory Visit: Payer: Self-pay | Admitting: Family Medicine

## 2015-07-25 ENCOUNTER — Other Ambulatory Visit: Payer: Self-pay | Admitting: Family Medicine

## 2015-07-31 ENCOUNTER — Ambulatory Visit: Payer: Self-pay | Admitting: Internal Medicine

## 2015-08-01 ENCOUNTER — Other Ambulatory Visit: Payer: Self-pay | Admitting: Internal Medicine

## 2015-08-15 ENCOUNTER — Other Ambulatory Visit: Payer: Self-pay | Admitting: Family Medicine

## 2015-08-15 NOTE — Telephone Encounter (Signed)
Rx sent to the pharmacy by e-script.//AB/CMA 

## 2015-08-22 ENCOUNTER — Other Ambulatory Visit: Payer: Self-pay | Admitting: Family Medicine

## 2015-09-08 ENCOUNTER — Other Ambulatory Visit: Payer: Self-pay | Admitting: Family Medicine

## 2015-09-08 ENCOUNTER — Other Ambulatory Visit: Payer: Self-pay | Admitting: Cardiology

## 2015-09-08 NOTE — Telephone Encounter (Signed)
Medication filled to pharmacy as requested.   

## 2015-09-08 NOTE — Telephone Encounter (Signed)
Rx(s) sent to pharmacy electronically.  

## 2015-09-26 ENCOUNTER — Telehealth: Payer: Self-pay | Admitting: Internal Medicine

## 2015-09-26 MED ORDER — INSULIN GLARGINE 100 UNIT/ML SOLOSTAR PEN: 27.0000 [IU] | PEN_INJECTOR | Freq: Every evening | SUBCUTANEOUS | Status: DC

## 2015-09-26 NOTE — Telephone Encounter (Signed)
Called pt to verify new dose. She said she up to 27 units daily. Rx sent electronically.

## 2015-09-26 NOTE — Telephone Encounter (Signed)
PT stated she needs a new prescription for her Lantus sent into [facility] in [place].  PT stated she is now taking more than 14 units and is needing it for the new amount.

## 2015-10-10 DIAGNOSIS — J4520 Mild intermittent asthma, uncomplicated: Secondary | ICD-10-CM | POA: Diagnosis not present

## 2015-10-10 DIAGNOSIS — R0600 Dyspnea, unspecified: Secondary | ICD-10-CM | POA: Diagnosis not present

## 2015-10-10 DIAGNOSIS — J310 Chronic rhinitis: Secondary | ICD-10-CM | POA: Diagnosis not present

## 2015-10-10 DIAGNOSIS — G4733 Obstructive sleep apnea (adult) (pediatric): Secondary | ICD-10-CM | POA: Diagnosis not present

## 2015-10-27 ENCOUNTER — Other Ambulatory Visit: Payer: Self-pay | Admitting: Family Medicine

## 2015-11-18 ENCOUNTER — Other Ambulatory Visit: Payer: Self-pay | Admitting: Internal Medicine

## 2015-12-04 ENCOUNTER — Encounter: Payer: Self-pay | Admitting: Medical

## 2015-12-04 ENCOUNTER — Ambulatory Visit (HOSPITAL_BASED_OUTPATIENT_CLINIC_OR_DEPARTMENT_OTHER)
Admission: RE | Admit: 2015-12-04 | Discharge: 2015-12-04 | Disposition: A | Discharge status: Home / Self Care | Payer: Medicare Other | Source: Ambulatory Visit | Attending: Medical | Admitting: Medical

## 2015-12-04 ENCOUNTER — Ambulatory Visit (INDEPENDENT_AMBULATORY_CARE_PROVIDER_SITE_OTHER): Payer: Medicare Other | Admitting: Medical

## 2015-12-04 VITALS — BP 146/83 | HR 97 | Temp 97.5°F | Ht 64.0 in | Wt 284.4 lb

## 2015-12-04 DIAGNOSIS — M79609 Pain in unspecified limb: Secondary | ICD-10-CM

## 2015-12-04 DIAGNOSIS — M25561 Pain in right knee: Secondary | ICD-10-CM

## 2015-12-04 DIAGNOSIS — L089 Local infection of the skin and subcutaneous tissue, unspecified: Secondary | ICD-10-CM | POA: Diagnosis not present

## 2015-12-04 DIAGNOSIS — M7122 Synovial cyst of popliteal space [Baker], left knee: Secondary | ICD-10-CM | POA: Diagnosis not present

## 2015-12-04 DIAGNOSIS — M1711 Unilateral primary osteoarthritis, right knee: Secondary | ICD-10-CM | POA: Diagnosis not present

## 2015-12-04 DIAGNOSIS — M179 Osteoarthritis of knee, unspecified: Secondary | ICD-10-CM | POA: Diagnosis not present

## 2015-12-04 DIAGNOSIS — M79674 Pain in right toe(s): Secondary | ICD-10-CM | POA: Diagnosis not present

## 2015-12-04 MED ORDER — KETOROLAC TROMETHAMINE 60 MG/2ML IM SOLN
60.0000 mg | Freq: Once | INTRAMUSCULAR | Status: AC
  Administered 2015-12-04: 60 mg via INTRAMUSCULAR

## 2015-12-04 MED ORDER — HYDROCODONE-ACETAMINOPHEN 5-325 MG PO TABS: 1.0000 | ORAL_TABLET | Freq: Four times a day (QID) | ORAL | 0 refills | Status: DC | PRN

## 2015-12-04 MED ORDER — CEPHALEXIN 500 MG PO CAPS: 500.0000 mg | ORAL_CAPSULE | Freq: Two times a day (BID) | ORAL | 0 refills | Status: DC

## 2015-12-04 MED ORDER — DICLOFENAC POTASSIUM 50 MG PO TABS: 50.0000 mg | ORAL_TABLET | Freq: Two times a day (BID) | ORAL | 0 refills | Status: DC

## 2015-12-04 NOTE — Addendum Note (Signed)
Addended by: Bunnie Domino on: 12/04/2015 03:17 PM   Modules accepted: Orders

## 2015-12-04 NOTE — Progress Notes (Signed)
Pre visit review using our clinic tool,if applicable. No additional management support is needed unless otherwise documented below in the visit note.  

## 2015-12-04 NOTE — Progress Notes (Signed)
Subjective:    Patient ID: Erin [name], female    DOB: 1945-08-31, 70 y.o.   MRN: [id number]  HPI  Pt in with rt toe pain pain for months.(mentioned but this is not worse pain). Rt great toe per pt had tenderness medial aspect. She cut portion of medial nail. Slight red now.   Also has pain knee and behind knee/popliteal.  This is worse pain. Pain for one week. No falls or trauma. Pain just started. Pt tried some advil for pain. It did not help. She mentioned she took tab that her sister gave her. It helped. Sounds like was given norco.   Pain is disrupting sleep.  Pt scheduled on 12-11-2015 wellness and 12-19-2015 to see Dr. Etter Sjogren.       Review of Systems  Constitutional: Negative for chills, fatigue and fever.  Respiratory: Negative for cough, choking, chest tightness and wheezing.   Cardiovascular: Negative for chest pain and palpitations.  Gastrointestinal: Negative for abdominal distention and blood in stool.  Musculoskeletal:       Rt knee pain. Leg pain. Rt toe pain.  Skin: Negative for rash.  Hematological: Negative for adenopathy. Does not bruise/bleed easily.  Psychiatric/Behavioral: Negative for behavioral problems and confusion.   Past Medical History:  Diagnosis Date  . Asthma   . CHF (congestive heart failure) [facility])    hospital 11/2013-- [place]  . Chronic kidney disease   . Depression   . Diabetes mellitus ([place])   . GERD (gastroesophageal reflux disease)   . Hyperlipidemia   . Hypertension      Social History   Social History  . Marital status: Widowed    Spouse name: N/A  . Number of children: 4  . Years of education: N/A   Occupational History  . DELI-MANAGER @ [employer] History Main Topics  . Smoking status: Former Smoker    Packs/day: 1.50    Years: 20.00    Types: Cigarettes    Quit date: 08/02/1984  . Smokeless tobacco: Never Used  . Alcohol use No  . Drug use: No  . Sexual activity: Not Currently    Partners:  Male   Other Topics Concern  . Not on file   Social History Narrative   NO REG EXERCISE   Caffeine use: daily    Past Surgical History:  Procedure Laterality Date  . CHOLECYSTECTOMY  2002  . SPINE SURGERY  aug 2015   [facility]  . TONSILLECTOMY    . TUBAL LIGATION      Family History  Problem Relation Age of Onset  . Hypertension Mother   . Alzheimer's disease Mother   . Hyperlipidemia Mother   . Heart disease Father     cad  . Asthma Father   . Cancer Father 81    leukemia  . Stroke Father   . Hypertension Father   . Heart disease Sister 19    MI  . Arthritis Brother   . Heart disease Maternal Uncle   . Stroke Maternal Uncle   . Uterine cancer Paternal Grandmother   . Stomach cancer Paternal Grandfather   . Heart disease Maternal Uncle   . Heart disease Maternal Uncle     No Known Allergies  Current Outpatient Prescriptions on File Prior to Visit  Medication Sig Dispense Refill  . allopurinol (ZYLOPRIM) 100 MG tablet Take 1 tablet by mouth 2 (two) times daily.    Marland Kitchen atorvastatin (LIPITOR) 40 MG tablet Take  1 tablet (40 mg total) by mouth daily. 90 tablet 0  . busPIRone (BUSPAR) 15 MG tablet TAKE ONE TABLET BY MOUTH THREE TIMES DAILY 90 tablet 1  . carvedilol (COREG) 6.25 MG tablet Take 1 tablet (6.25 mg total) by mouth every 12 (twelve) hours. PLEASE CONTACT OFFICE FOR ADDITIONAL REFILLS 180 tablet 0  . COLCRYS 0.6 MG tablet Take 1 tablet by mouth daily.    . ergocalciferol (VITAMIN D2) 50000 UNITS capsule Take 50,000 Units by mouth once a week.    . Ferrous Sulfate (IRON) 325 (65 FE) MG TABS Take 1 tablet by mouth 2 (two) times daily.    . furosemide (LASIX) 20 MG tablet Take 3 tablets (60 mg total) by mouth daily. 270 tablet 3  . glipiZIDE (GLUCOTROL) 10 MG tablet TAKE TWO TABLETS BY MOUTH TWICE DAILY BEFORE MEAL(S) 120 tablet 2  . hydrALAZINE (APRESOLINE) 50 MG tablet TAKE ONE TABLET BY MOUTH THREE TIMES DAILY 90 tablet 1  . Insulin Pen Needle 32G X  4 MM MISC Use 1x a day 100 each 11  . isosorbide mononitrate (IMDUR) 30 MG 24 hr tablet TAKE ONE TABLET BY MOUTH ONCE DAILY 30 tablet 5  . LANTUS SOLOSTAR 100 UNIT/ML Solostar Pen INJECT 27 UNITS SUBCUTANEOUSLY EVERY EVENING 15 mL 2  . pramipexole (MIRAPEX) 1 MG tablet TAKE ONE TABLET BY MOUTH AT BEDTIME 30 tablet 5  . sertraline (ZOLOFT) 50 MG tablet Take 1 tablet (50 mg total) by mouth daily. (Patient taking differently: Take 75 mg by mouth daily. ) 90 tablet 3  . albuterol (PROVENTIL) (2.5 MG/3ML) 0.083% nebulizer solution Take 3 mLs (2.5 mg total) by nebulization every 6 (six) hours as needed for wheezing or shortness of breath. (Patient not taking: Reported on 12/04/2015) 75 mL 12  . azithromycin (ZITHROMAX Z-PAK) 250 MG tablet As directed (Patient not taking: Reported on 12/04/2015) 6 each 0  . fenofibrate 160 MG tablet TAKE ONE TABLET BY MOUTH ONCE DAILY (Patient not taking: Reported on 12/04/2015) 90 tablet 0  . Fluticasone Furoate-Vilanterol (BREO ELLIPTA) 200-25 MCG/INH AEPB Inhale 1 puff into the lungs daily.    Marland Kitchen ipratropium (ATROVENT) 0.02 % nebulizer solution Take 2.5 mLs (0.5 mg total) by nebulization 4 (four) times daily. (Patient not taking: Reported on 12/04/2015) 75 mL 12  . pantoprazole (PROTONIX) 40 MG tablet TAKE ONE TABLET BY MOUTH ONCE DAILY (Patient not taking: Reported on 12/04/2015) 30 tablet 6   No current facility-administered medications on file prior to visit.     BP (!) 146/83   Pulse 97   Temp 97.5 F (36.4 C) (Oral)   Ht '5\' 4"'$  (1.626 m)   Wt 284 lb 6.4 oz (129 kg)   SpO2 95%   BMI 48.82 kg/m       Objective:   Physical Exam  General- No acute distress. Pleasant patient. Neck- Full range of motion, no jvd Lungs- Clear, even and unlabored. Heart- regular rate and rhythm. Neurologic- CNII- XII grossly intact.  Lower ext- rt knee pain on palpation. But not warm and not tender to light touch.(not gout like). Mild pain of rt knee flexion and extension Lt  knee- no pain on palpation.  Rt and lt leg- positive homan sign. Rt side greater than left. 1 + pedal edema bilaterally.  Rt foot- normal. Rt toe medial aspect faint red, tender to touch. Medial nail recently cut. Slight blood at edge of nail.      Assessment & Plan:  For rt knee pain will  get xray.  For popliteal pain will get get bilateral lower ext ultrasounds stat today.  For pain will rx diclofenac. For severe pain exceeding diclofenac limited # of norco given.  For skin infection rx keflex. Warm salt water soaks. No cutting nails presently.  Follow up 7 days or as needed  Eduar Kumpf, [name], [employer]

## 2015-12-04 NOTE — Patient Instructions (Addendum)
For rt knee pain will get xray.  For popliteal pain will get get bilateral lower ext ultrasounds stat today.  For pain will rx diclofenac. For seveer pain exceeding diclofenac limited # of norco given.  For skin infection rx keflex. Warm salt water soaks. No cutting nails presently.  Follow up 7 days or as needed  Pt requested something for pain now. So we gave toradol 60 mg im. Asked her to wait until tomorrow until she take diclofenac.

## 2015-12-11 ENCOUNTER — Ambulatory Visit: Payer: Self-pay | Admitting: *Deleted

## 2015-12-11 ENCOUNTER — Ambulatory Visit (INDEPENDENT_AMBULATORY_CARE_PROVIDER_SITE_OTHER): Payer: Medicare Other | Admitting: *Deleted

## 2015-12-11 ENCOUNTER — Encounter: Payer: Self-pay | Admitting: Family Medicine

## 2015-12-11 ENCOUNTER — Ambulatory Visit (INDEPENDENT_AMBULATORY_CARE_PROVIDER_SITE_OTHER): Payer: Medicare Other | Admitting: Family Medicine

## 2015-12-11 ENCOUNTER — Ambulatory Visit (HOSPITAL_BASED_OUTPATIENT_CLINIC_OR_DEPARTMENT_OTHER)
Admission: RE | Admit: 2015-12-11 | Discharge: 2015-12-11 | Disposition: A | Discharge status: Home / Self Care | Payer: Medicare Other | Source: Ambulatory Visit | Attending: Family Medicine | Admitting: Family Medicine

## 2015-12-11 ENCOUNTER — Encounter: Payer: Self-pay | Admitting: *Deleted

## 2015-12-11 VITALS — BP 134/70 | HR 83 | Temp 97.8°F | Resp 22 | Ht 64.0 in | Wt 281.4 lb

## 2015-12-11 DIAGNOSIS — R0789 Other chest pain: Secondary | ICD-10-CM

## 2015-12-11 DIAGNOSIS — IMO0001 Reserved for inherently not codable concepts without codable children: Secondary | ICD-10-CM

## 2015-12-11 DIAGNOSIS — R69 Illness, unspecified: Secondary | ICD-10-CM

## 2015-12-11 DIAGNOSIS — R0602 Shortness of breath: Secondary | ICD-10-CM | POA: Diagnosis not present

## 2015-12-11 NOTE — Progress Notes (Signed)
Chief Complaint  Patient presents with  . Cough    product-yellowish-green,and white,SOB,chest congestion,weakness-all sxs x 1 week    Subjective: Patient is a 70 y.o. female here for chest congestion and cough.  She was originally scheduled for a AMV, but could not complete it due to feeling so poorly. SOB walking. Coughing, productive, particular when waking up. Mucinex helps a little bit. Feels like things are dripping to the back of her throat. She also has a hx of asthma, but has not been using albuterol.  ROS: Heart: + L sided chest pain or palpitations Lungs: +SOB and cough  Family History  Problem Relation Age of Onset  . Hypertension Mother   . Alzheimer's disease Mother   . Hyperlipidemia Mother   . Heart disease Father     cad  . Asthma Father   . Cancer Father 34    leukemia  . Stroke Father   . Hypertension Father   . Heart disease Sister 47    MI  . Pulmonary fibrosis Sister   . Arthritis Brother   . Heart disease Maternal Uncle   . Stroke Maternal Uncle   . Uterine cancer Paternal Grandmother   . Stomach cancer Paternal Grandfather   . Heart disease Maternal Uncle   . Heart disease Maternal Uncle    Past Medical History:  Diagnosis Date  . Asthma   . CHF (congestive heart failure) [facility])    hospital 11/2013-- [place]  . Chronic kidney disease   . Depression   . Diabetes mellitus ([place])   . GERD (gastroesophageal reflux disease)   . Hyperlipidemia   . Hypertension    No Known Allergies  Current Outpatient Prescriptions:  .  albuterol (PROVENTIL) (2.5 MG/3ML) 0.083% nebulizer solution, Take 3 mLs (2.5 mg total) by nebulization every 6 (six) hours as needed for wheezing or shortness of breath., Disp: 75 mL, Rfl: 12 .  allopurinol (ZYLOPRIM) 100 MG tablet, Take 1 tablet by mouth 2 (two) times daily., Disp: , Rfl:  .  atorvastatin (LIPITOR) 40 MG tablet, Take 1 tablet (40 mg total) by mouth daily., Disp: 90 tablet, Rfl: 0 .  azithromycin (ZITHROMAX  Z-PAK) 250 MG tablet, As directed, Disp: 6 each, Rfl: 0 .  busPIRone (BUSPAR) 15 MG tablet, TAKE ONE TABLET BY MOUTH THREE TIMES DAILY, Disp: 90 tablet, Rfl: 1 .  carvedilol (COREG) 6.25 MG tablet, Take 1 tablet (6.25 mg total) by mouth every 12 (twelve) hours. PLEASE CONTACT OFFICE FOR ADDITIONAL REFILLS, Disp: 180 tablet, Rfl: 0 .  cephALEXin (KEFLEX) 500 MG capsule, Take 1 capsule (500 mg total) by mouth 2 (two) times daily., Disp: 14 capsule, Rfl: 0 .  COLCRYS 0.6 MG tablet, Take 1 tablet by mouth daily., Disp: , Rfl:  .  diclofenac (CATAFLAM) 50 MG tablet, Take 1 tablet (50 mg total) by mouth 2 (two) times daily., Disp: 14 tablet, Rfl: 0 .  ergocalciferol (VITAMIN D2) 50000 UNITS capsule, Take 50,000 Units by mouth once a week., Disp: , Rfl:  .  fenofibrate 160 MG tablet, TAKE ONE TABLET BY MOUTH ONCE DAILY, Disp: 90 tablet, Rfl: 0 .  Ferrous Sulfate (IRON) 325 (65 FE) MG TABS, Take 1 tablet by mouth 2 (two) times daily., Disp: , Rfl:  .  Fluticasone Furoate-Vilanterol (BREO ELLIPTA) 200-25 MCG/INH AEPB, Inhale 1 puff into the lungs daily., Disp: , Rfl:  .  furosemide (LASIX) 20 MG tablet, Take 3 tablets (60 mg total) by mouth daily., Disp: 270 tablet, Rfl: 3 .  glipiZIDE (GLUCOTROL) 10 MG tablet, TAKE TWO TABLETS BY MOUTH TWICE DAILY BEFORE MEAL(S) (Patient taking differently: 1 tablet BID), Disp: 120 tablet, Rfl: 2 .  hydrALAZINE (APRESOLINE) 50 MG tablet, TAKE ONE TABLET BY MOUTH THREE TIMES DAILY, Disp: 90 tablet, Rfl: 1 .  HYDROcodone-acetaminophen (NORCO) 5-325 MG tablet, Take 1 tablet by mouth every 6 (six) hours as needed for moderate pain., Disp: 12 tablet, Rfl: 0 .  Insulin Pen Needle 32G X 4 MM MISC, Use 1x a day (Patient taking differently: 27 Units. Use 1x a day), Disp: 100 each, Rfl: 11 .  ipratropium (ATROVENT) 0.02 % nebulizer solution, Take 2.5 mLs (0.5 mg total) by nebulization 4 (four) times daily., Disp: 75 mL, Rfl: 12 .  isosorbide mononitrate (IMDUR) 30 MG 24 hr tablet,  TAKE ONE TABLET BY MOUTH ONCE DAILY, Disp: 30 tablet, Rfl: 5 .  LANTUS SOLOSTAR 100 UNIT/ML Solostar Pen, INJECT 27 UNITS SUBCUTANEOUSLY EVERY EVENING, Disp: 15 mL, Rfl: 2 .  pantoprazole (PROTONIX) 40 MG tablet, TAKE ONE TABLET BY MOUTH ONCE DAILY, Disp: 30 tablet, Rfl: 6 .  pramipexole (MIRAPEX) 1 MG tablet, TAKE ONE TABLET BY MOUTH AT BEDTIME, Disp: 30 tablet, Rfl: 5 .  sertraline (ZOLOFT) 50 MG tablet, Take 1 tablet (50 mg total) by mouth daily. (Patient taking differently: Take 75 mg by mouth daily. ), Disp: 90 tablet, Rfl: 3  Objective: BP 134/70 (BP Location: Right Arm, Patient Position: Sitting, Cuff Size: Large)   Pulse 83   Temp 97.8 F (36.6 C) (Oral)   Resp (!) 22   Ht '5\' 4"'$  (1.626 m)   Wt 281 lb 6.4 oz (127.6 kg)   SpO2 96%   BMI 48.30 kg/m  General: Awake, appears stated age HEENT: MMM, EOMi Heart: RRR, no murmurs Lungs: CTAB, no rales, wheezes or rhonchi. Normal effort Abd: BS+, soft, NT, ND, no masses or organomegaly Psych: Age appropriate judgment and insight, normal affect and mood  Assessment and Plan: Shortness of breath - Plan: DG Chest 2 View  Musculoskeletal chest pain  Orders as above. I see no evidence of pneumonia or pulmonary edema. Await official read.  Low Na diet, daily weights, and fluid restriction recommended. If she worsened, ER. If she fails to improve, follow up early next week. This was written down for her as well. I believe she is NYHA II or possibly III. She is a candidate for a neprilysin inhibitor combined with angiotensin receptor blocker. The latter would also serve to preserve her glomerular function regarding her CKD. I will defer this to her POR as she has intimate knowledge of her medication list. Her chest pain is elicited by movement and is tender to palpation. It is not exertional. Follow-up as originally scheduled with her POR. The patient voiced understanding and agreement to the plan.  [place], DO 12/11/15   4:04 PM

## 2015-12-11 NOTE — Progress Notes (Signed)
Pre visit review using our clinic review tool, if applicable. No additional management support is needed unless otherwise documented below in the visit note. 

## 2015-12-11 NOTE — Progress Notes (Signed)
   Subjective:   Erin [name] is a 70 y.o. female who presents for an Initial Medicare Annual Wellness Visit.  Patient arrived for Wellness Visit with complaints of weakness, tired, productive cough, chest congestion and increased SOB from baseline x 1 week.   Wellness visit cancelled, patient scheduled for Same Day Visit with Dr. Nani Ravens.   Gerilyn Nestle, RN   12/11/2015

## 2015-12-11 NOTE — Patient Instructions (Addendum)
Low-Sodium Eating Plan Sodium raises blood pressure and causes water to be held in the body. Getting less sodium from food will help lower your blood pressure, reduce any swelling, and protect your heart, liver, and kidneys. We get sodium by adding salt (sodium chloride) to food. Most of our sodium comes from canned, boxed, and frozen foods. Restaurant foods, fast foods, and pizza are also very high in sodium. Even if you take medicine to lower your blood pressure or to reduce fluid in your body, getting less sodium from your food is important. WHAT IS MY PLAN? Most people should limit their sodium intake to 2,300 mg a day. Your health care provider recommends that you limit your sodium intake to 2000 mg a day.  WHAT DO I NEED TO KNOW ABOUT THIS EATING PLAN? For the low-sodium eating plan, you will follow these general guidelines:  Choose foods with a % Daily Value for sodium of less than 5% (as listed on the food label).   Use salt-free seasonings or herbs instead of table salt or sea salt.   Check with your health care provider or pharmacist before using salt substitutes.   Eat fresh foods.  Eat more vegetables and fruits.  Limit canned vegetables. If you do use them, rinse them well to decrease the sodium.   Limit cheese to 1 oz (28 g) per day.   Eat lower-sodium products, often labeled as "lower sodium" or "no salt added."  Avoid foods that contain monosodium glutamate (MSG). MSG is sometimes added to [place] food and some canned foods.  Check food labels (Nutrition Facts labels) on foods to learn how much sodium is in one serving.  Eat more home-cooked food and less restaurant, buffet, and fast food.  When eating at a restaurant, ask that your food be prepared with less salt, or no salt if possible.  HOW DO I READ FOOD LABELS FOR SODIUM INFORMATION? The Nutrition Facts label lists the amount of sodium in one serving of the food. If you eat more than one serving, you must  multiply the listed amount of sodium by the number of servings. Food labels may also identify foods as:  Sodium free--Less than 5 mg in a serving.  Very low sodium--35 mg or less in a serving.  Low sodium--140 mg or less in a serving.  Light in sodium--50% less sodium in a serving. For example, if a food that usually has 300 mg of sodium is changed to become light in sodium, it will have 150 mg of sodium.  Reduced sodium--25% less sodium in a serving. For example, if a food that usually has 400 mg of sodium is changed to reduced sodium, it will have 300 mg of sodium. WHAT FOODS CAN I EAT? Grains Low-sodium cereals, including oats, puffed wheat and rice, and shredded wheat cereals. Low-sodium crackers. Unsalted rice and pasta. Lower-sodium bread.  Vegetables Frozen or fresh vegetables. Low-sodium or reduced-sodium canned vegetables. Low-sodium or reduced-sodium tomato sauce and paste. Low-sodium or reduced-sodium tomato and vegetable juices.  Fruits Fresh, frozen, and canned fruit. Fruit juice.  Meat and Other Protein Products Low-sodium canned tuna and salmon. Fresh or frozen meat, poultry, seafood, and fish. Lamb. Unsalted nuts. Dried beans, peas, and lentils without added salt. Unsalted canned beans. Homemade soups without salt. Eggs.  Dairy Milk. Soy milk. Ricotta cheese. Low-sodium or reduced-sodium cheeses. Yogurt.  Condiments Fresh and dried herbs and spices. Salt-free seasonings. Onion and garlic powders. Low-sodium varieties of mustard and ketchup. Fresh or refrigerated horseradish.  Lemon juice.  Fats and Oils Reduced-sodium salad dressings. Unsalted butter.  Other Unsalted popcorn and pretzels.  The items listed above may not be a complete list of recommended foods or beverages. Contact your dietitian for more options. WHAT FOODS ARE NOT RECOMMENDED? Grains Instant hot cereals. Bread stuffing, pancake, and biscuit mixes. Croutons. Seasoned rice or pasta mixes.  Noodle soup cups. Boxed or frozen macaroni and cheese. Self-rising flour. Regular salted crackers. Vegetables Regular canned vegetables. Regular canned tomato sauce and paste. Regular tomato and vegetable juices. Frozen vegetables in sauces. Salted [place] fries. Olives. Angie Fava. Relishes. Sauerkraut. Salsa. Meat and Other Protein Products Salted, canned, smoked, spiced, or pickled meats, seafood, or fish. Bacon, ham, sausage, hot dogs, corned beef, chipped beef, and packaged luncheon meats. Salt pork. Jerky. Pickled herring. Anchovies, regular canned tuna, and sardines. Salted nuts. Dairy Processed cheese and cheese spreads. Cheese curds. Blue cheese and cottage cheese. Buttermilk.  Condiments Onion and garlic salt, seasoned salt, table salt, and sea salt. Canned and packaged gravies. Worcestershire sauce. Tartar sauce. Barbecue sauce. Teriyaki sauce. Soy sauce, including reduced sodium. Steak sauce. Fish sauce. Oyster sauce. Cocktail sauce. Horseradish that you find on the shelf. Regular ketchup and mustard. Meat flavorings and tenderizers. Bouillon cubes. Hot sauce. Tabasco sauce. Marinades. Taco seasonings. Relishes. Fats and Oils Regular salad dressings. Salted butter. Margarine. Ghee. Bacon fat.  Other Potato and tortilla chips. Corn chips and puffs. Salted popcorn and pretzels. Canned or dried soups. Pizza. Frozen entrees and pot pies.  The items listed above may not be a complete list of foods and beverages to avoid. Contact your dietitian for more information.   This information is not intended to replace advice given to you by your health care provider. Make sure you discuss any questions you have with your health care provider.   Document Released: 09/11/2001 Document Revised: 04/12/2014 Document Reviewed: 01/24/2013 Elsevier Interactive Patient Education 2016 [place] yourself daily.  Limit your fluid intake to 2.5 L daily if possible.  If you start having  worsening shortness of breath, go to the ER.  If you are not better by Monday, come in to see Dr. Carollee Herter.

## 2015-12-12 ENCOUNTER — Other Ambulatory Visit: Payer: Self-pay | Admitting: Family Medicine

## 2015-12-14 ENCOUNTER — Other Ambulatory Visit: Payer: Self-pay | Admitting: Family Medicine

## 2015-12-19 ENCOUNTER — Ambulatory Visit: Payer: Self-pay | Admitting: Family Medicine

## 2015-12-20 ENCOUNTER — Other Ambulatory Visit: Payer: Self-pay | Admitting: Family Medicine

## 2016-01-06 ENCOUNTER — Ambulatory Visit (INDEPENDENT_AMBULATORY_CARE_PROVIDER_SITE_OTHER): Payer: Medicare Other | Admitting: Family Medicine

## 2016-01-06 VITALS — BP 134/81 | HR 93 | Temp 98.0°F | Ht 64.0 in | Wt 281.2 lb

## 2016-01-06 DIAGNOSIS — K219 Gastro-esophageal reflux disease without esophagitis: Secondary | ICD-10-CM

## 2016-01-06 DIAGNOSIS — E1151 Type 2 diabetes mellitus with diabetic peripheral angiopathy without gangrene: Secondary | ICD-10-CM | POA: Diagnosis not present

## 2016-01-06 DIAGNOSIS — I10 Essential (primary) hypertension: Secondary | ICD-10-CM | POA: Diagnosis not present

## 2016-01-06 DIAGNOSIS — E1165 Type 2 diabetes mellitus with hyperglycemia: Secondary | ICD-10-CM

## 2016-01-06 DIAGNOSIS — E785 Hyperlipidemia, unspecified: Secondary | ICD-10-CM | POA: Diagnosis not present

## 2016-01-06 DIAGNOSIS — IMO0002 Reserved for concepts with insufficient information to code with codable children: Secondary | ICD-10-CM

## 2016-01-06 DIAGNOSIS — M10079 Idiopathic gout, unspecified ankle and foot: Secondary | ICD-10-CM | POA: Diagnosis not present

## 2016-01-06 DIAGNOSIS — Z23 Encounter for immunization: Secondary | ICD-10-CM | POA: Diagnosis not present

## 2016-01-06 LAB — POCT URINALYSIS DIPSTICK
Bilirubin, UA: NEGATIVE
Blood, UA: NEGATIVE
Glucose, UA: 1000
Ketones, UA: NEGATIVE
Leukocytes, UA: NEGATIVE
Nitrite, UA: NEGATIVE
Protein, UA: NEGATIVE
Spec Grav, UA: 1.025
Urobilinogen, UA: 0.2
pH, UA: 5.5

## 2016-01-06 MED ORDER — TETANUS-DIPHTH-ACELL PERTUSSIS 5-2.5-18.5 LF-MCG/0.5 IM SUSP: 0.5000 mL | Freq: Once | INTRAMUSCULAR | 0 refills | Status: AC

## 2016-01-06 MED ORDER — PANTOPRAZOLE SODIUM 40 MG PO TBEC: 40.0000 mg | DELAYED_RELEASE_TABLET | Freq: Every day | ORAL | 1 refills | Status: DC

## 2016-01-06 NOTE — Progress Notes (Signed)
Pre visit review using our clinic review tool, if applicable. No additional management support is needed unless otherwise documented below in the visit note. 

## 2016-01-06 NOTE — Progress Notes (Signed)
Patient ID: Erin [name], female    DOB: 21-May-1945  Age: 70 y.o. MRN: [id number]    Subjective:  Subjective  HPI Erin [name] presents for f/u dm, cholesterol and bp.  C/o big toe right foot is sore to touch.    HPI HYPERTENSION  no Blood pressure range-not check ing  Chest pain- no      Dyspnea- no Lightheadedness- no   Edema- no change Other side effects -   Medication compliance: good Low salt diet- yes    DIABETES    Blood Sugar ranges-high-- 200s  Polyuria- no New Visual problems- no  Hypoglycemic symptoms- no  Other side effects-no Medication compliance - good Last eye exam- due Foot exam- today   HYPERLIPIDEMIA  Medication compliance- good RUQ pain- o  Muscle aches- no Other side effects-no   Review of Systems  Constitutional: Negative for activity change, appetite change, diaphoresis, fatigue and unexpected weight change.  Eyes: Negative for pain, redness and visual disturbance.  Respiratory: Negative for cough, chest tightness, shortness of breath and wheezing.   Cardiovascular: Negative for chest pain, palpitations and leg swelling.  Endocrine: Negative for cold intolerance, heat intolerance, polydipsia, polyphagia and polyuria.  Genitourinary: Negative for difficulty urinating, dysuria and frequency.  Neurological: Negative for dizziness, light-headedness, numbness and headaches.  Psychiatric/Behavioral: Negative for behavioral problems and dysphoric mood. The patient is not nervous/anxious.     History Past Medical History:  Diagnosis Date  . Asthma   . CHF (congestive heart failure) [facility])    hospital 11/2013-- [place]  . Chronic kidney disease   . Depression   . Diabetes mellitus ([place])   . GERD (gastroesophageal reflux disease)   . Hyperlipidemia   . Hypertension     She has a past surgical history that includes Cholecystectomy (2002); Tubal ligation; Tonsillectomy; and Spine surgery (aug 2015).   Her family history includes  Alzheimer's disease in her mother; Arthritis in her brother; Asthma in her father; Cancer (age of onset: 45) in her father; Heart disease in her father, maternal uncle, maternal uncle, and maternal uncle; Heart disease (age of onset: 68) in her sister; Hyperlipidemia in her mother; Hypertension in her father and mother; Pulmonary fibrosis in her sister; Stomach cancer in her paternal grandfather; Stroke in her father and maternal uncle; Uterine cancer in her paternal grandmother.She reports that she quit smoking about 31 years ago. Her smoking use included Cigarettes. She has a 30.00 pack-year smoking history. She has never used smokeless tobacco. She reports that she does not drink alcohol or use drugs.  Current Outpatient Prescriptions on File Prior to Visit  Medication Sig Dispense Refill  . albuterol (PROVENTIL) (2.5 MG/3ML) 0.083% nebulizer solution Take 3 mLs (2.5 mg total) by nebulization every 6 (six) hours as needed for wheezing or shortness of breath. 75 mL 12  . allopurinol (ZYLOPRIM) 100 MG tablet Take 1 tablet by mouth 2 (two) times daily.    Marland Kitchen atorvastatin (LIPITOR) 40 MG tablet TAKE ONE TABLET BY MOUTH ONCE DAILY 30 tablet 5  . busPIRone (BUSPAR) 15 MG tablet TAKE ONE TABLET BY MOUTH THREE TIMES DAILY 90 tablet 1  . carvedilol (COREG) 6.25 MG tablet Take 1 tablet (6.25 mg total) by mouth every 12 (twelve) hours. PLEASE CONTACT OFFICE FOR ADDITIONAL REFILLS 180 tablet 0  . COLCRYS 0.6 MG tablet Take 1 tablet by mouth daily.    . diclofenac (CATAFLAM) 50 MG tablet Take 1 tablet (50 mg total) by mouth 2 (two) times daily. [place]  tablet 0  . ergocalciferol (VITAMIN D2) 50000 UNITS capsule Take 50,000 Units by mouth once a week.    . fenofibrate 160 MG tablet TAKE ONE TABLET BY MOUTH ONCE DAILY 90 tablet 0  . Ferrous Sulfate (IRON) 325 (65 FE) MG TABS Take 1 tablet by mouth 2 (two) times daily.    . Fluticasone Furoate-Vilanterol (BREO ELLIPTA) 200-25 MCG/INH AEPB Inhale 1 puff into the lungs  daily.    . furosemide (LASIX) 20 MG tablet Take 3 tablets (60 mg total) by mouth daily. 270 tablet 3  . glipiZIDE (GLUCOTROL) 10 MG tablet TAKE TWO TABLETS BY MOUTH TWICE DAILY BEFORE MEAL(S) (Patient taking differently: 1 tablet BID) 120 tablet 2  . hydrALAZINE (APRESOLINE) 50 MG tablet TAKE ONE TABLET BY MOUTH THREE TIMES DAILY 90 tablet 1  . Insulin Pen Needle 32G X 4 MM MISC Use 1x a day (Patient taking differently: 27 Units. Use 1x a day) 100 each 11  . ipratropium (ATROVENT) 0.02 % nebulizer solution Take 2.5 mLs (0.5 mg total) by nebulization 4 (four) times daily. 75 mL 12  . isosorbide mononitrate (IMDUR) 30 MG 24 hr tablet TAKE ONE TABLET BY MOUTH ONCE DAILY 30 tablet 5  . LANTUS SOLOSTAR 100 UNIT/ML Solostar Pen INJECT 27 UNITS SUBCUTANEOUSLY EVERY EVENING 15 mL 2  . pramipexole (MIRAPEX) 1 MG tablet TAKE ONE TABLET BY MOUTH AT BEDTIME 30 tablet 5  . sertraline (ZOLOFT) 50 MG tablet Take 1 tablet (50 mg total) by mouth daily. (Patient taking differently: Take 75 mg by mouth daily. ) 90 tablet 3   No current facility-administered medications on file prior to visit.      Objective:  Objective  Physical Exam  Constitutional: She is oriented to person, place, and time. She appears well-developed and well-nourished.  HENT:  Head: Normocephalic and atraumatic.  Eyes: Conjunctivae and EOM are normal.  Neck: Normal range of motion. Neck supple. No JVD present. Carotid bruit is not present. No thyromegaly present.  Cardiovascular: Normal rate, regular rhythm and normal heart sounds.   No murmur heard. Pulmonary/Chest: Effort normal and breath sounds normal. No respiratory distress. She has no wheezes. She has no rales. She exhibits no tenderness.  Musculoskeletal: She exhibits no edema.       Right foot: There is tenderness and swelling.       Feet:  Neurological: She is alert and oriented to person, place, and time.  Psychiatric: She has a normal mood and affect. Her behavior is  normal. Judgment and thought content normal.  Nursing note and vitals reviewed. Sensory exam of the foot is normal, tested with the monofilament. Good pulses, no lesions or ulcers, good peripheral pulses.----R big toe -- numb but she can feel the monofilament  BP 134/81 (BP Location: Left Arm, Patient Position: Sitting, Cuff Size: Large)   Pulse 93   Temp 98 F (36.7 C) (Oral)   Ht '5\' 4"'$  (1.626 m)   Wt 281 lb 3.2 oz (127.6 kg)   SpO2 94%   BMI 48.27 kg/m  Wt Readings from Last 3 Encounters:  01/06/16 281 lb 3.2 oz (127.6 kg)  12/11/15 281 lb 6.4 oz (127.6 kg)  12/04/15 284 lb 6.4 oz (129 kg)     Lab Results  Component Value Date   WBC 8.3 01/06/2016   HGB 11.0 (L) 01/06/2016   HCT 33.0 (L) 01/06/2016   PLT 124.0 Giant Platelets seen on smear. (L) 01/06/2016   GLUCOSE 355 (H) 01/06/2016   CHOL 141 01/06/2016  TRIG 297.0 (H) 01/06/2016   HDL 35.40 (L) 01/06/2016   LDLDIRECT 64.0 01/06/2016   LDLCALC 46 12/10/2014   ALT 26 01/06/2016   AST 21 01/06/2016   NA 133 (L) 01/06/2016   K 3.7 01/06/2016   CL 93 (L) 01/06/2016   CREATININE 1.51 (H) 01/06/2016   BUN 45 (H) 01/06/2016   CO2 29 01/06/2016   TSH 2.29 05/17/2011   HGBA1C 11.2 Repeated and verified X2. (H) 01/06/2016   MICROALBUR 2.7 (H) 01/06/2016    Dg Chest 2 View  Result Date: 12/11/2015 CLINICAL DATA:  Shortness of breath for 1 week EXAM: CHEST  2 VIEW COMPARISON:  06/19/2015 FINDINGS: Cardiac shadow is stable but mildly enlarged. The lungs are well aerated bilaterally. Given some technical variations in the film the overall appearance is stable. No acute bony abnormality is seen. No sizable effusion is noted. IMPRESSION: No active cardiopulmonary disease. Electronically Signed   By: Inez Catalina M.D.   On: 12/11/2015 16:06     Assessment & Plan:  Plan  I have discontinued Ms. Test's azithromycin, HYDROcodone-acetaminophen, and cephALEXin. I have also changed her pantoprazole. Additionally, I am having her  start on Tdap. Lastly, I am having her maintain her sertraline, ergocalciferol, Iron, furosemide, Insulin Pen Needle, fluticasone furoate-vilanterol, glipiZIDE, allopurinol, COLCRYS, albuterol, ipratropium, isosorbide mononitrate, pramipexole, carvedilol, hydrALAZINE, LANTUS SOLOSTAR, diclofenac, atorvastatin, fenofibrate, and busPIRone.  Meds ordered this encounter  Medications  . pantoprazole (PROTONIX) 40 MG tablet    Sig: Take 1 tablet (40 mg total) by mouth daily.    Dispense:  90 tablet    Refill:  1  . Tdap (BOOSTRIX) 5-2.5-18.5 LF-MCG/0.5 injection    Sig: Inject 0.5 mLs into the muscle once.    Dispense:  0.5 mL    Refill:  0    Problem List Items Addressed This Visit      Unprioritized   Essential hypertension    Stable con't meds--coreg       Relevant Orders   Comprehensive metabolic panel (Completed)   CBC with Differential/Platelet (Completed)   Microalbumin / creatinine urine ratio (Completed)   POCT urinalysis dipstick (Completed)    Other Visit Diagnoses    Need for prophylactic vaccination and inoculation against influenza    -  Primary   Relevant Orders   Flu vaccine HIGH DOSE PF (Fluzone High dose)   DM (diabetes mellitus) type II uncontrolled, periph vascular disorder (HCC)       Relevant Orders   Hemoglobin A1c (Completed)   Microalbumin / creatinine urine ratio (Completed)   POCT urinalysis dipstick (Completed)   Hyperlipidemia LDL goal <100       Relevant Orders   Comprehensive metabolic panel (Completed)   Lipid panel (Completed)   Gastroesophageal reflux disease, esophagitis presence not specified       Relevant Medications   pantoprazole (PROTONIX) 40 MG tablet   Idiopathic gout of foot, unspecified chronicity, unspecified laterality       Relevant Orders   Uric acid (Completed)   Encounter for immunization       Relevant Orders   Flu vaccine HIGH DOSE PF (Completed)      Follow-up: Return in about 6 months (around 07/06/2016) for  hypertension, hyperlipidemia, diabetes II.  Ann Held, DO

## 2016-01-06 NOTE — Patient Instructions (Signed)

## 2016-01-07 ENCOUNTER — Encounter: Payer: Self-pay | Admitting: Family Medicine

## 2016-01-07 LAB — CBC WITH DIFFERENTIAL/PLATELET
Basophils Absolute: 0.1 10*3/uL (ref 0.0–0.1)
Basophils Relative: 0.8 % (ref 0.0–3.0)
Eosinophils Absolute: 0 10*3/uL (ref 0.0–0.7)
Eosinophils Relative: 0.6 % (ref 0.0–5.0)
HCT: 33 % — ABNORMAL LOW (ref 36.0–46.0)
Hemoglobin: 11 g/dL — ABNORMAL LOW (ref 12.0–15.0)
Lymphocytes Relative: 12.8 % (ref 12.0–46.0)
Lymphs Abs: 1.1 10*3/uL (ref 0.7–4.0)
MCHC: 33.3 g/dL (ref 30.0–36.0)
MCV: 81.5 fl (ref 78.0–100.0)
Monocytes Absolute: 0.6 10*3/uL (ref 0.1–1.0)
Monocytes Relative: 7 % (ref 3.0–12.0)
Neutro Abs: 6.5 10*3/uL (ref 1.4–7.7)
Neutrophils Relative %: 78.8 % — ABNORMAL HIGH (ref 43.0–77.0)
Platelets: 124 10*3/uL — ABNORMAL LOW (ref 150.0–400.0)
RBC: 4.05 Mil/uL (ref 3.87–5.11)
RDW: 18.1 % — ABNORMAL HIGH (ref 11.5–15.5)
WBC: 8.3 10*3/uL (ref 4.0–10.5)

## 2016-01-07 LAB — LIPID PANEL
Cholesterol: 141 mg/dL (ref 0–200)
HDL: 35.4 mg/dL — ABNORMAL LOW (ref >39.00)
NonHDL: 105.9
Total CHOL/HDL Ratio: 4
Triglycerides: 297 mg/dL — ABNORMAL HIGH (ref 0.0–149.0)
VLDL: 59.4 mg/dL — ABNORMAL HIGH (ref 0.0–40.0)

## 2016-01-07 LAB — COMPREHENSIVE METABOLIC PANEL
ALT: 26 U/L (ref 0–35)
AST: 21 U/L (ref 0–37)
Albumin: 4 g/dL (ref 3.5–5.2)
Alkaline Phosphatase: 94 U/L (ref 39–117)
BUN: 45 mg/dL — ABNORMAL HIGH (ref 6–23)
CO2: 29 mEq/L (ref 19–32)
Calcium: 9.1 mg/dL (ref 8.4–10.5)
Chloride: 93 mEq/L — ABNORMAL LOW (ref 96–112)
Creatinine, Ser: 1.51 mg/dL — ABNORMAL HIGH (ref 0.40–1.20)
GFR: 36.13 mL/min — ABNORMAL LOW (ref >60.00)
Glucose, Bld: 355 mg/dL — ABNORMAL HIGH (ref 70–99)
Potassium: 3.7 mEq/L (ref 3.5–5.1)
Sodium: 133 mEq/L — ABNORMAL LOW (ref 135–145)
Total Bilirubin: 0.7 mg/dL (ref 0.2–1.2)
Total Protein: 7.3 g/dL (ref 6.0–8.3)

## 2016-01-07 LAB — HEMOGLOBIN A1C: Hgb A1c MFr Bld: 11.2 % — ABNORMAL HIGH (ref 4.6–6.5)

## 2016-01-07 LAB — MICROALBUMIN / CREATININE URINE RATIO
Creatinine,U: 106.8 mg/dL
Microalb Creat Ratio: 2.5 mg/g (ref 0.0–30.0)
Microalb, Ur: 2.7 mg/dL — ABNORMAL HIGH (ref 0.0–1.9)

## 2016-01-07 LAB — URIC ACID: Uric Acid, Serum: 6.9 mg/dL (ref 2.4–7.0)

## 2016-01-07 LAB — LDL CHOLESTEROL, DIRECT: Direct LDL: 64 mg/dL

## 2016-01-07 NOTE — Assessment & Plan Note (Addendum)
Stable con't meds--coreg

## 2016-01-16 ENCOUNTER — Ambulatory Visit (INDEPENDENT_AMBULATORY_CARE_PROVIDER_SITE_OTHER): Payer: Medicare Other | Admitting: Internal Medicine

## 2016-01-16 ENCOUNTER — Encounter: Payer: Self-pay | Admitting: Internal Medicine

## 2016-01-16 VITALS — BP 144/62 | HR 83 | Ht 64.0 in | Wt 280.0 lb

## 2016-01-16 DIAGNOSIS — Z794 Long term (current) use of insulin: Secondary | ICD-10-CM

## 2016-01-16 DIAGNOSIS — E119 Type 2 diabetes mellitus without complications: Secondary | ICD-10-CM | POA: Diagnosis not present

## 2016-01-16 MED ORDER — GLIPIZIDE 10 MG PO TABS: 10.0000 mg | ORAL_TABLET | Freq: Two times a day (BID) | ORAL | 3 refills | Status: DC

## 2016-01-16 MED ORDER — INSULIN PEN NEEDLE 32G X 4 MM MISC: 3 refills | Status: DC

## 2016-01-16 MED ORDER — INSULIN NPH ISOPHANE & REGULAR (70-30) 100 UNIT/ML ~~LOC~~ SUSP: SUBCUTANEOUS | 3 refills | Status: DC

## 2016-01-16 NOTE — Patient Instructions (Signed)
Please stop Lantus.  Continue Glipizide 10 mg 2x a day before meals.  Start Novolin ReliOn 70/30 - 15-30 min before b'fast and supper: - 25 units for a smaller meal  - 30 units for a larger meal   Please return in 1.5 months with your sugar log.

## 2016-01-16 NOTE — Progress Notes (Signed)
Patient ID: Erin [name], female   DOB: 10/06/1945, 70 y.o.   MRN: [id number]  HPI: Erin [name] is a 70 y.o.-year-old female, returning for f/u for DM2, dx 2013, insulin-dependent, uncontrolled, with complications (CKD). Last visit 9 mo ago (!).  She started to see high sugars starting 1 mo ago. She had ABx then.  Last hemoglobin A1c was: Lab Results  Component Value Date   HGBA1C 11.2 Repeated and verified X2. (H) 01/06/2016   HGBA1C 8.4 05/01/2015   HGBA1C 9.0 (H) 12/10/2014   Pt is on a regimen of: - Glipizide 10 mg bid - Lantus 27 units in am She had to stop Tradjenta 5 mg for 1 year b/c price >> restarted now but still very expensive.  Had to stop Metformin 2/2 AKI after her back sx.  Pt checks her sugars 1-2 a day (no log, no meter): - am: 110-115 (highest 150) >> 117-148 >> 100-140 >> 120-140 >> >200 >> 197-250 >> 200-300s - 2h after b'fast: 79-140 >> <140 >> n/c >> 300s >> 185-220 >> n/c - before lunch: 124-200 >> 100-115 >> n/c >> 200-244 >> n/c - 2h after lunch: 137-200 >> 150-170s >> 140s-150s >> 300s-400 >> 207-244 >> n/c - before dinner: 120-200 >> 130s >> 148, 189 >> n/c >> 208 - 2h after dinner: 165, 200 >> n/c >> 1h after dinner: 180-200 >> 200-300 >> 187-259, 280 >> n/c - bedtime: 95-126 >> n/c  No lows. Lowest sugar was 90s; she has hypoglycemia awareness at 70.  Highest sugar was 200s >> 400 >> 300s  Meals: - Breakfast: eggs + toast; occas. grits - Lunch: sandwich, soup - Dinner: meat + veggies + starch or sandwich - Snacks: grapes, icecream   - + CKD, last BUN/creatinine:  GFR 37. Lab Results  Component Value Date   BUN 45 (H) 01/06/2016   CREATININE 1.51 (H) 01/06/2016  11/30/2013 [facility] - Cr 1.8  On Lisinopril. - last set of lipids: Lab Results  Component Value Date   CHOL 141 01/06/2016   HDL 35.40 (L) 01/06/2016   LDLCALC 46 12/10/2014   LDLDIRECT 64.0 01/06/2016   TRIG 297.0 (H) 01/06/2016   CHOLHDL 4 01/06/2016   On Lipitor. - last eye exam was in ~06/2014. No DR. Cataract >> had R eye surgery, had the cataract sx. - + numbness and tingling in her  big toe, not feet. She has swelling in feet 2/2 heat.   She also has a history of HL, asthma, depression, vit D def.   I reviewed pt's medications, allergies, PMH, social hx, family hx, and changes were documented in the history of present illness. Otherwise, unchanged from my initial visit note.  ROS: Constitutional: no weight gain/loss, + fatigue, no subjective hyperthermia/hypothermia Eyes: no blurry vision, no xerophthalmia ENT: no sore throat, no nodules palpated in throat, no dysphagia/odynophagia, no hoarseness Cardiovascular: no CP/SOB/palpitations/leg swelling Respiratory: no cough/SOB Gastrointestinal: no N/V/D/C Musculoskeletal: no muscle/joint aches Skin: no rashes Neurological: no tremors/numbness/tingling/dizziness  PE: BP (!) 144/62   Pulse 83   Ht '5\' 4"'$  (1.626 m)   Wt 280 lb (127 kg)   SpO2 91%   BMI 48.06 kg/m  Body mass index is 48.06 kg/m. Wt Readings from Last 3 Encounters:  01/16/16 280 lb (127 kg)  01/06/16 281 lb 3.2 oz (127.6 kg)  12/11/15 281 lb 6.4 oz (127.6 kg)   Constitutional: obese, in NAD Eyes: PERRLA, EOMI, no exophthalmos ENT: moist mucous membranes, no thyromegaly, no cervical lymphadenopathy  Cardiovascular: RRR, No MRG, + marked pitting edema bilat. Respiratory: CTA B Gastrointestinal: abdomen soft, NT, ND, BS+ Musculoskeletal: no deformities, strength intact in all 4 Skin: moist, warm, no rashes  ASSESSMENT: 1. DM2, insulin-dependent, uncontrolled, with complications - CKD  PLAN:  1. Patient with long-standing, previously well controlled diabetes, now with terrible control, which has improved some after addition of long acting insulin but she now returns after a long absence with highr sugars. A recent HbA1c was >11%. Will need short acting insulin. However, she cannot afford the insulin (doughnut  hole) >> I suggested NPH and R but she tells me she does not think she can mix them >> will start 70/30 pen. - I advised her to:  Patient Instructions  Please stop Lantus.  Continue Glipizide 10 mg 2x a day before meals.  Start Novolin ReliOn 70/30 - 15-30 min before b'fast and supper: - 25 units for a smaller meal  - 30 units for a larger meal   Please return in 1.5 months with your sugar log.    - continue checking sugars at different times of the day - check 2-3 times a day, rotating checks  - given new logs - needs one eye exams - Return to clinic in 1.5 mo with sugar log   Philemon Kingdom, MD PhD [facility] Endocrinology

## 2016-01-21 DIAGNOSIS — N183 Chronic kidney disease, stage 3 (moderate): Secondary | ICD-10-CM | POA: Diagnosis not present

## 2016-01-22 DIAGNOSIS — E1122 Type 2 diabetes mellitus with diabetic chronic kidney disease: Secondary | ICD-10-CM | POA: Diagnosis not present

## 2016-01-22 DIAGNOSIS — E8779 Other fluid overload: Secondary | ICD-10-CM | POA: Diagnosis not present

## 2016-01-22 DIAGNOSIS — E876 Hypokalemia: Secondary | ICD-10-CM | POA: Diagnosis not present

## 2016-01-22 DIAGNOSIS — N183 Chronic kidney disease, stage 3 (moderate): Secondary | ICD-10-CM | POA: Diagnosis not present

## 2016-01-22 DIAGNOSIS — E559 Vitamin D deficiency, unspecified: Secondary | ICD-10-CM | POA: Diagnosis not present

## 2016-01-22 DIAGNOSIS — I129 Hypertensive chronic kidney disease with stage 1 through stage 4 chronic kidney disease, or unspecified chronic kidney disease: Secondary | ICD-10-CM | POA: Diagnosis not present

## 2016-01-26 ENCOUNTER — Other Ambulatory Visit: Payer: Self-pay | Admitting: Cardiology

## 2016-01-26 NOTE — Telephone Encounter (Signed)
Rx request sent to pharmacy.  

## 2016-01-27 DIAGNOSIS — J4520 Mild intermittent asthma, uncomplicated: Secondary | ICD-10-CM | POA: Diagnosis not present

## 2016-01-27 DIAGNOSIS — G4733 Obstructive sleep apnea (adult) (pediatric): Secondary | ICD-10-CM | POA: Diagnosis not present

## 2016-01-27 DIAGNOSIS — J310 Chronic rhinitis: Secondary | ICD-10-CM | POA: Diagnosis not present

## 2016-02-07 DIAGNOSIS — R0602 Shortness of breath: Secondary | ICD-10-CM | POA: Diagnosis not present

## 2016-02-07 DIAGNOSIS — R05 Cough: Secondary | ICD-10-CM | POA: Diagnosis not present

## 2016-02-07 DIAGNOSIS — I509 Heart failure, unspecified: Secondary | ICD-10-CM | POA: Diagnosis not present

## 2016-02-07 DIAGNOSIS — M7989 Other specified soft tissue disorders: Secondary | ICD-10-CM | POA: Diagnosis not present

## 2016-02-07 DIAGNOSIS — Z780 Asymptomatic menopausal state: Secondary | ICD-10-CM | POA: Diagnosis not present

## 2016-02-07 DIAGNOSIS — R0600 Dyspnea, unspecified: Secondary | ICD-10-CM | POA: Diagnosis not present

## 2016-02-07 DIAGNOSIS — Z79899 Other long term (current) drug therapy: Secondary | ICD-10-CM | POA: Diagnosis not present

## 2016-02-17 DIAGNOSIS — N183 Chronic kidney disease, stage 3 (moderate): Secondary | ICD-10-CM | POA: Diagnosis not present

## 2016-02-17 DIAGNOSIS — I129 Hypertensive chronic kidney disease with stage 1 through stage 4 chronic kidney disease, or unspecified chronic kidney disease: Secondary | ICD-10-CM | POA: Diagnosis not present

## 2016-02-18 DIAGNOSIS — I129 Hypertensive chronic kidney disease with stage 1 through stage 4 chronic kidney disease, or unspecified chronic kidney disease: Secondary | ICD-10-CM | POA: Diagnosis not present

## 2016-02-18 DIAGNOSIS — N183 Chronic kidney disease, stage 3 (moderate): Secondary | ICD-10-CM | POA: Diagnosis not present

## 2016-02-18 DIAGNOSIS — N179 Acute kidney failure, unspecified: Secondary | ICD-10-CM | POA: Diagnosis not present

## 2016-02-18 DIAGNOSIS — D631 Anemia in chronic kidney disease: Secondary | ICD-10-CM | POA: Diagnosis not present

## 2016-02-18 DIAGNOSIS — E559 Vitamin D deficiency, unspecified: Secondary | ICD-10-CM | POA: Diagnosis not present

## 2016-02-18 DIAGNOSIS — E1122 Type 2 diabetes mellitus with diabetic chronic kidney disease: Secondary | ICD-10-CM | POA: Diagnosis not present

## 2016-02-18 DIAGNOSIS — E8779 Other fluid overload: Secondary | ICD-10-CM | POA: Diagnosis not present

## 2016-03-03 DIAGNOSIS — J4520 Mild intermittent asthma, uncomplicated: Secondary | ICD-10-CM | POA: Diagnosis not present

## 2016-03-03 DIAGNOSIS — J310 Chronic rhinitis: Secondary | ICD-10-CM | POA: Diagnosis not present

## 2016-03-03 DIAGNOSIS — F338 Other recurrent depressive disorders: Secondary | ICD-10-CM | POA: Diagnosis not present

## 2016-03-03 DIAGNOSIS — G4733 Obstructive sleep apnea (adult) (pediatric): Secondary | ICD-10-CM | POA: Diagnosis not present

## 2016-03-10 ENCOUNTER — Encounter: Payer: Self-pay | Admitting: Internal Medicine

## 2016-03-10 ENCOUNTER — Ambulatory Visit (INDEPENDENT_AMBULATORY_CARE_PROVIDER_SITE_OTHER): Payer: Medicare Other | Admitting: Internal Medicine

## 2016-03-10 VITALS — BP 144/84 | HR 89 | Wt 280.0 lb

## 2016-03-10 DIAGNOSIS — Z794 Long term (current) use of insulin: Secondary | ICD-10-CM

## 2016-03-10 DIAGNOSIS — E1121 Type 2 diabetes mellitus with diabetic nephropathy: Secondary | ICD-10-CM

## 2016-03-10 MED ORDER — INSULIN SYRINGE-NEEDLE U-100 30G X 1/2" 0.5 ML MISC
5 refills | Status: DC
Start: 2016-03-10 — End: 2016-07-23

## 2016-03-10 MED ORDER — INSULIN NPH ISOPHANE & REGULAR (70-30) 100 UNIT/ML ~~LOC~~ SUSP: SUBCUTANEOUS | 5 refills | Status: DC

## 2016-03-10 NOTE — Progress Notes (Signed)
Patient ID: Erin [name], female   DOB: 01-Aug-1945, 70 y.o.   MRN: [id number]  HPI: Erin [name] is a 70 y.o.-year-old female, returning for f/u for DM2, dx 2013, insulin-dependent, uncontrolled, with complications (CKD). Last visit 2 mo ago.  Last hemoglobin A1c was: Lab Results  Component Value Date   HGBA1C 11.2 Repeated and verified X2. (H) 01/06/2016   HGBA1C 8.4 05/01/2015   HGBA1C 9.0 (H) 12/10/2014   Pt was on a regimen of: - Glipizide 10 mg bid - Lantus 27 units in am  At last visit, we changed to: - Glipizide 10 mg 2x a day before meals. - Novolin ReliOn 70/30 - 15-30 min before b'fast and supper: - 25 units for a smaller meal  - 30 units for a larger meal  She had to stop Tradjenta 5 mg for 1 year b/c price >> restarted now but still very expensive.  Had to stop Metformin 2/2 AKI after her back sx.  Pt checks her sugars 1-2 a day (no log, no meter): - am: 117-148 >> 100-140 >> 120-140 >> >200 >> 197-250 >> 200-300s >> 270s - 2h after b'fast: 79-140 >> <140 >> n/c >> 300s >> 185-220 >> n/c - before lunch: 124-200 >> 100-115 >> n/c >> 200-244 >> n/c - 2h after lunch: 137-200 >> 150-170s >> 140s-150s >> 300s-400 >> 207-244 >> n/c - before dinner: 120-200 >> 130s >> 148, 189 >> n/c >> 208 >> n/c  >> 280-350 - 2h after dinner: 1h after dinner: 180-200 >> 200-300 >> 187-259, 280 >> n/c >> 290s - bedtime: 95-126 >> n/c  Lowest sugar was 90s >> ; she has hypoglycemia awareness at 70.  Highest sugar was 200s >> 400 >> 300s >> 350  Meals: - Breakfast: eggs + toast; occas. grits - Lunch: sandwich, soup - Dinner: meat + veggies + starch or sandwich - Snacks: grapes, icecream   - + CKD, last BUN/creatinine:  GFR 37. Lab Results  Component Value Date   BUN 45 (H) 01/06/2016   CREATININE 1.51 (H) 01/06/2016  11/30/2013 [facility] - Cr 1.8  On Lisinopril. - last set of lipids: Lab Results  Component Value Date   CHOL 141 01/06/2016   HDL 35.40 (L)  01/06/2016   LDLCALC 46 12/10/2014   LDLDIRECT 64.0 01/06/2016   TRIG 297.0 (H) 01/06/2016   CHOLHDL 4 01/06/2016  On Lipitor. - last eye exam was in ~06/2014. No DR. Cataract >> had R eye surgery, had the cataract sx. - + numbness and tingling in her  big toe, not feet. She has swelling in feet 2/2 heat.   She also has a history of HL, asthma, depression, vit D def.   I reviewed pt's medications, allergies, PMH, social hx, family hx, and changes were documented in the history of present illness. Otherwise, unchanged from my initial visit note.  ROS: Constitutional: + weight gain (noy our scale), + fatigue, no subjective hyperthermia/hypothermia, + nocturia Eyes: no blurry vision, no xerophthalmia ENT: no sore throat, no nodules palpated in throat, no dysphagia/odynophagia, no hoarseness Cardiovascular: no CP/+ SOB/no palpitations/+ leg swelling Respiratory: + all: cough/SOB/wheezing Gastrointestinal: no N/V/D/C Musculoskeletal: + both: muscle/joint aches Skin: no rashes Neurological: no tremors/numbness/tingling/dizziness  PE: BP (!) 144/84   Pulse 89   Wt 280 lb (127 kg)   BMI 48.06 kg/m  Body mass index is 48.06 kg/m. Wt Readings from Last 3 Encounters:  03/10/16 280 lb (127 kg)  01/16/16 280 lb (127 kg)  01/06/16  281 lb 3.2 oz (127.6 kg)   Constitutional: obese, in NAD Eyes: PERRLA, EOMI, no exophthalmos ENT: moist mucous membranes, no thyromegaly, no cervical lymphadenopathy Cardiovascular: RRR, No MRG, + marked pitting edema bilat. Respiratory: CTA B Gastrointestinal: abdomen soft, NT, ND, BS+ Musculoskeletal: no deformities, strength intact in all 4 Skin: moist, warm, no rashes  ASSESSMENT: 1. DM2, insulin-dependent, uncontrolled, with complications - CKD  PLAN:  1. Patient with long-standing, previously well controlled diabetes, with terrible control at last visit >> we added short acting insulin in the form of 70/30 premixed insulin b/c she could not afford  the insulin (doughnut hole). She does not bring a log or meter but mentions sugars are still very high. Will increase insulin doses. I also advised her to avoid fried foods and concentrated sweets. - I advised her to:  Patient Instructions  Please continue: - Glipizide 10 mg 2x a day before meals.  Please continue: - Novolin ReliOn 70/30 - 15-30 min before b'fast and supper: - 35 units for a smaller meal  - 40 units for a larger meal   Please return in 1.5 months with your sugar log.   - continue checking sugars at different times of the day - check 2-3 times a day, rotating checks  - needs a new eye exam >> again advised to schedule - Return to clinic in 1.5 mo with sugar log   Philemon Kingdom, MD PhD [facility] Endocrinology

## 2016-03-10 NOTE — Patient Instructions (Addendum)
Please continue: - Glipizide 10 mg 2x a day before meals.  Please continue: - Novolin ReliOn 70/30 - 15-30 min before b'fast and supper: - 35 units for a smaller meal  - 40 units for a larger meal   Please return in 1.5 months with your sugar log.

## 2016-03-17 DIAGNOSIS — S60512A Abrasion of left hand, initial encounter: Secondary | ICD-10-CM | POA: Diagnosis not present

## 2016-03-17 DIAGNOSIS — N179 Acute kidney failure, unspecified: Secondary | ICD-10-CM | POA: Diagnosis not present

## 2016-03-17 DIAGNOSIS — L0390 Cellulitis, unspecified: Secondary | ICD-10-CM | POA: Diagnosis not present

## 2016-03-19 DIAGNOSIS — E1122 Type 2 diabetes mellitus with diabetic chronic kidney disease: Secondary | ICD-10-CM | POA: Diagnosis not present

## 2016-03-19 DIAGNOSIS — I129 Hypertensive chronic kidney disease with stage 1 through stage 4 chronic kidney disease, or unspecified chronic kidney disease: Secondary | ICD-10-CM | POA: Diagnosis not present

## 2016-03-19 DIAGNOSIS — E559 Vitamin D deficiency, unspecified: Secondary | ICD-10-CM | POA: Diagnosis not present

## 2016-03-19 DIAGNOSIS — N183 Chronic kidney disease, stage 3 (moderate): Secondary | ICD-10-CM | POA: Diagnosis not present

## 2016-03-19 DIAGNOSIS — D631 Anemia in chronic kidney disease: Secondary | ICD-10-CM | POA: Diagnosis not present

## 2016-03-19 DIAGNOSIS — E8779 Other fluid overload: Secondary | ICD-10-CM | POA: Diagnosis not present

## 2016-03-25 ENCOUNTER — Telehealth: Payer: Self-pay | Admitting: Internal Medicine

## 2016-03-25 NOTE — Telephone Encounter (Signed)
I would probably decrease the insulin doses by 5-7 units today, just to make sure he doesn't drop again.

## 2016-03-25 NOTE — Telephone Encounter (Signed)
BS was 87 this AM fasting, she did not give herself the insulin shot right now it is 134 she is asking if she should take the shot and pill or is this still too low?

## 2016-03-25 NOTE — Telephone Encounter (Signed)
Pt is aware of the suggestion and verbalized understanding.

## 2016-04-06 ENCOUNTER — Other Ambulatory Visit: Payer: Self-pay | Admitting: Family Medicine

## 2016-04-07 ENCOUNTER — Other Ambulatory Visit: Payer: Self-pay | Admitting: Family Medicine

## 2016-04-20 DIAGNOSIS — J310 Chronic rhinitis: Secondary | ICD-10-CM | POA: Diagnosis not present

## 2016-04-20 DIAGNOSIS — J4520 Mild intermittent asthma, uncomplicated: Secondary | ICD-10-CM | POA: Diagnosis not present

## 2016-04-20 DIAGNOSIS — G4733 Obstructive sleep apnea (adult) (pediatric): Secondary | ICD-10-CM | POA: Diagnosis not present

## 2016-04-20 DIAGNOSIS — R5383 Other fatigue: Secondary | ICD-10-CM | POA: Diagnosis not present

## 2016-04-21 ENCOUNTER — Ambulatory Visit: Payer: Self-pay | Admitting: Internal Medicine

## 2016-04-22 ENCOUNTER — Other Ambulatory Visit: Payer: Self-pay | Admitting: Family Medicine

## 2016-05-05 DIAGNOSIS — L820 Inflamed seborrheic keratosis: Secondary | ICD-10-CM | POA: Diagnosis not present

## 2016-05-13 DIAGNOSIS — H00015 Hordeolum externum left lower eyelid: Secondary | ICD-10-CM | POA: Diagnosis not present

## 2016-05-17 DIAGNOSIS — G4733 Obstructive sleep apnea (adult) (pediatric): Secondary | ICD-10-CM | POA: Diagnosis not present

## 2016-05-25 DIAGNOSIS — J4520 Mild intermittent asthma, uncomplicated: Secondary | ICD-10-CM | POA: Diagnosis not present

## 2016-05-25 DIAGNOSIS — G4733 Obstructive sleep apnea (adult) (pediatric): Secondary | ICD-10-CM | POA: Diagnosis not present

## 2016-05-25 DIAGNOSIS — J310 Chronic rhinitis: Secondary | ICD-10-CM | POA: Diagnosis not present

## 2016-05-25 DIAGNOSIS — R5383 Other fatigue: Secondary | ICD-10-CM | POA: Diagnosis not present

## 2016-05-26 DIAGNOSIS — L820 Inflamed seborrheic keratosis: Secondary | ICD-10-CM | POA: Diagnosis not present

## 2016-05-28 DIAGNOSIS — H00015 Hordeolum externum left lower eyelid: Secondary | ICD-10-CM | POA: Diagnosis not present

## 2016-06-06 ENCOUNTER — Other Ambulatory Visit: Payer: Self-pay | Admitting: Family Medicine

## 2016-06-18 DIAGNOSIS — S8011XA Contusion of right lower leg, initial encounter: Secondary | ICD-10-CM | POA: Diagnosis not present

## 2016-06-18 DIAGNOSIS — N183 Chronic kidney disease, stage 3 (moderate): Secondary | ICD-10-CM | POA: Diagnosis not present

## 2016-06-19 DIAGNOSIS — N183 Chronic kidney disease, stage 3 (moderate): Secondary | ICD-10-CM | POA: Diagnosis not present

## 2016-06-25 DIAGNOSIS — G4733 Obstructive sleep apnea (adult) (pediatric): Secondary | ICD-10-CM | POA: Diagnosis not present

## 2016-06-29 DIAGNOSIS — D631 Anemia in chronic kidney disease: Secondary | ICD-10-CM | POA: Diagnosis not present

## 2016-06-29 DIAGNOSIS — E559 Vitamin D deficiency, unspecified: Secondary | ICD-10-CM | POA: Diagnosis not present

## 2016-06-29 DIAGNOSIS — I129 Hypertensive chronic kidney disease with stage 1 through stage 4 chronic kidney disease, or unspecified chronic kidney disease: Secondary | ICD-10-CM | POA: Diagnosis not present

## 2016-06-29 DIAGNOSIS — N183 Chronic kidney disease, stage 3 (moderate): Secondary | ICD-10-CM | POA: Diagnosis not present

## 2016-06-29 DIAGNOSIS — E1122 Type 2 diabetes mellitus with diabetic chronic kidney disease: Secondary | ICD-10-CM | POA: Diagnosis not present

## 2016-06-29 DIAGNOSIS — E8779 Other fluid overload: Secondary | ICD-10-CM | POA: Diagnosis not present

## 2016-07-07 DIAGNOSIS — L0390 Cellulitis, unspecified: Secondary | ICD-10-CM | POA: Diagnosis not present

## 2016-07-08 ENCOUNTER — Ambulatory Visit: Payer: Self-pay | Admitting: Internal Medicine

## 2016-07-12 ENCOUNTER — Ambulatory Visit (INDEPENDENT_AMBULATORY_CARE_PROVIDER_SITE_OTHER): Payer: Medicare Other | Admitting: Family Medicine

## 2016-07-12 ENCOUNTER — Encounter: Payer: Self-pay | Admitting: Family Medicine

## 2016-07-12 VITALS — BP 128/66 | HR 75 | Temp 97.4°F | Resp 18 | Ht 64.0 in | Wt 265.0 lb

## 2016-07-12 DIAGNOSIS — E785 Hyperlipidemia, unspecified: Secondary | ICD-10-CM | POA: Diagnosis not present

## 2016-07-12 DIAGNOSIS — F329 Major depressive disorder, single episode, unspecified: Secondary | ICD-10-CM | POA: Diagnosis not present

## 2016-07-12 DIAGNOSIS — Z1231 Encounter for screening mammogram for malignant neoplasm of breast: Secondary | ICD-10-CM

## 2016-07-12 DIAGNOSIS — Z1239 Encounter for other screening for malignant neoplasm of breast: Secondary | ICD-10-CM

## 2016-07-12 DIAGNOSIS — F32A Depression, unspecified: Secondary | ICD-10-CM

## 2016-07-12 DIAGNOSIS — I509 Heart failure, unspecified: Secondary | ICD-10-CM

## 2016-07-12 DIAGNOSIS — Z0000 Encounter for general adult medical examination without abnormal findings: Secondary | ICD-10-CM

## 2016-07-12 DIAGNOSIS — Z23 Encounter for immunization: Secondary | ICD-10-CM

## 2016-07-12 LAB — COMPREHENSIVE METABOLIC PANEL
ALT: 18 U/L (ref 0–35)
AST: 24 U/L (ref 0–37)
Albumin: 3.7 g/dL (ref 3.5–5.2)
Alkaline Phosphatase: 105 U/L (ref 39–117)
BUN: 79 mg/dL — ABNORMAL HIGH (ref 6–23)
CO2: 25 mEq/L (ref 19–32)
Calcium: 9.2 mg/dL (ref 8.4–10.5)
Chloride: 95 mEq/L — ABNORMAL LOW (ref 96–112)
Creatinine, Ser: 2.62 mg/dL — ABNORMAL HIGH (ref 0.40–1.20)
GFR: 19.1 mL/min — ABNORMAL LOW (ref >60.00)
Glucose, Bld: 136 mg/dL — ABNORMAL HIGH (ref 70–99)
Potassium: 3.5 mEq/L (ref 3.5–5.1)
Sodium: 131 mEq/L — ABNORMAL LOW (ref 135–145)
Total Bilirubin: 0.5 mg/dL (ref 0.2–1.2)
Total Protein: 7.4 g/dL (ref 6.0–8.3)

## 2016-07-12 LAB — LIPID PANEL
Cholesterol: 116 mg/dL (ref 0–200)
HDL: 27 mg/dL — ABNORMAL LOW (ref >39.00)
NonHDL: 89.33
Total CHOL/HDL Ratio: 4
Triglycerides: 203 mg/dL — ABNORMAL HIGH (ref 0.0–149.0)
VLDL: 40.6 mg/dL — ABNORMAL HIGH (ref 0.0–40.0)

## 2016-07-12 LAB — LDL CHOLESTEROL, DIRECT: Direct LDL: 52 mg/dL

## 2016-07-12 MED ORDER — ZOSTER VAC RECOMB ADJUVANTED 50 MCG/0.5ML IM SUSR: 0.5000 mL | Freq: Once | INTRAMUSCULAR | 1 refills | Status: AC

## 2016-07-12 MED ORDER — SERTRALINE HCL 50 MG PO TABS: 75.0000 mg | ORAL_TABLET | Freq: Every day | ORAL | 3 refills | Status: DC

## 2016-07-12 NOTE — Assessment & Plan Note (Signed)
Stable.  On meds

## 2016-07-12 NOTE — Progress Notes (Signed)
Patient ID: Erin [name], female   DOB: 1945/11/06, 71 y.o.   MRN: [id number]     Subjective:  I acted as a Education administrator for Dr. Carollee Herter.  Guerry Bruin, [place]   Patient ID: LONETTA BLASSINGAME, female    DOB: 1946/03/20, 71 y.o.   MRN: [id number]  Chief Complaint  Patient presents with  . Hyperlipidemia  . Anxiety  . Medicare Wellness    w/ RN    HPI  Patient is in today for follow up cholesterol and depression.  She has been doing ok on her medications.   She has an abscess on left pointer finger.  She went to urgent care and they prescribed doxycycline and septra.  She states that it looks much better but feels it needs to be lanced.  Patient Care Team: Ann Held, DO as PCP - General Thereasa Distance, MD as Consulting Physician (Nephrology) Philemon Kingdom, MD as Consulting Physician (Internal Medicine) Lelon Perla, MD as Consulting Physician (Cardiology) Gardiner Rhyme, MD as Consulting Physician (Pulmonary Disease)   Past Medical History:  Diagnosis Date  . Asthma   . CHF (congestive heart failure) [facility])    hospital 11/2013-- [place]  . Chronic kidney disease   . Depression   . Diabetes mellitus ([place])   . GERD (gastroesophageal reflux disease)   . Hyperlipidemia   . Hypertension     Past Surgical History:  Procedure Laterality Date  . CHOLECYSTECTOMY  2002  . SPINE SURGERY  aug 2015   [facility]  . TONSILLECTOMY    . TUBAL LIGATION      Family History  Problem Relation Age of Onset  . Hypertension Mother   . Alzheimer's disease Mother   . Hyperlipidemia Mother   . Heart disease Father     cad  . Asthma Father   . Cancer Father 65    leukemia  . Stroke Father   . Hypertension Father   . Heart disease Sister 28    MI  . Pulmonary fibrosis Sister   . Arthritis Brother   . Heart disease Maternal Uncle   . Stroke Maternal Uncle   . Uterine cancer Paternal Grandmother   . Stomach cancer Paternal Grandfather   . Heart disease Maternal  Uncle   . Heart disease Maternal Uncle     Social History   Social History  . Marital status: Widowed    Spouse name: N/A  . Number of children: 4  . Years of education: N/A   Occupational History  . DELI-MANAGER @ [employer] History Main Topics  . Smoking status: Former Smoker    Packs/day: 1.50    Years: 20.00    Types: Cigarettes    Quit date: 08/02/1984  . Smokeless tobacco: Never Used  . Alcohol use No  . Drug use: No  . Sexual activity: Not Currently    Partners: Male   Other Topics Concern  . Not on file   Social History Narrative   NO REG EXERCISE   Caffeine use: daily    Outpatient Medications Prior to Visit  Medication Sig Dispense Refill  . albuterol (PROVENTIL) (2.5 MG/3ML) 0.083% nebulizer solution Take 3 mLs (2.5 mg total) by nebulization every 6 (six) hours as needed for wheezing or shortness of breath. 75 mL 12  . allopurinol (ZYLOPRIM) 100 MG tablet Take 1 tablet by mouth 2 (two) times daily.    Marland Kitchen atorvastatin (LIPITOR) 40 MG tablet TAKE ONE TABLET BY  MOUTH ONCE DAILY 30 tablet 5  . busPIRone (BUSPAR) 15 MG tablet Take 1 tablet (15 mg total) by mouth 3 (three) times daily. 90 tablet 1  . carvedilol (COREG) 6.25 MG tablet Take 1 tablet (6.25 mg total) by mouth every 12 (twelve) hours. Please schedule appointment for refills. 180 tablet 0  . ergocalciferol (VITAMIN D2) 50000 UNITS capsule Take 50,000 Units by mouth once a week.    . Ferrous Sulfate (IRON) 325 (65 FE) MG TABS Take 1 tablet by mouth 2 (two) times daily.    Marland Kitchen glipiZIDE (GLUCOTROL) 10 MG tablet Take 1 tablet (10 mg total) by mouth 2 (two) times daily before a meal. 180 tablet 3  . insulin NPH-regular Human (NOVOLIN 70/30 RELION) (70-30) 100 UNIT/ML injection Inject under skin 15-30 min before b'fast and supper: 35-40 units as advised. ReliOn. 20 mL 5  . Insulin Syringe-Needle U-100 (B-D INS SYR ULTRAFINE .5CC/30G) 30G X 1/2" 0.5 ML MISC Use 2x a day 200 each 5  . pantoprazole  (PROTONIX) 40 MG tablet Take 1 tablet (40 mg total) by mouth daily. 90 tablet 1  . pramipexole (MIRAPEX) 1 MG tablet TAKE ONE TABLET BY MOUTH AT BEDTIME 30 tablet 5  . sertraline (ZOLOFT) 50 MG tablet Take 1 tablet (50 mg total) by mouth daily. (Patient taking differently: Take 75 mg by mouth daily. ) 90 tablet 3  . Fluticasone Furoate-Vilanterol (BREO ELLIPTA) 200-25 MCG/INH AEPB Inhale 1 puff into the lungs daily.    Marland Kitchen COLCRYS 0.6 MG tablet Take 1 tablet by mouth daily.    . diclofenac (CATAFLAM) 50 MG tablet Take 1 tablet (50 mg total) by mouth 2 (two) times daily. 14 tablet 0  . fenofibrate 160 MG tablet TAKE ONE TABLET BY MOUTH ONCE DAILY 90 tablet 0  . fenofibrate 160 MG tablet TAKE ONE TABLET BY MOUTH ONCE DAILY 90 tablet 0  . furosemide (LASIX) 20 MG tablet Take 3 tablets (60 mg total) by mouth daily. 270 tablet 3  . hydrALAZINE (APRESOLINE) 50 MG tablet TAKE ONE TABLET BY MOUTH THREE TIMES DAILY 90 tablet 1  . ipratropium (ATROVENT) 0.02 % nebulizer solution Take 2.5 mLs (0.5 mg total) by nebulization 4 (four) times daily. 75 mL 12  . isosorbide mononitrate (IMDUR) 30 MG 24 hr tablet TAKE ONE TABLET BY MOUTH ONCE DAILY 30 tablet 5   No facility-administered medications prior to visit.     No Known Allergies  Review of Systems  Constitutional: Negative for fever and malaise/fatigue.  HENT: Negative for congestion.   Eyes: Negative for blurred vision.  Respiratory: Negative for cough and shortness of breath.   Cardiovascular: Negative for chest pain, palpitations and leg swelling.  Gastrointestinal: Negative for vomiting.  Musculoskeletal: Negative for back pain.  Skin: Negative for rash.  Neurological: Negative for loss of consciousness and headaches.       Objective:    Physical Exam  Constitutional: She is oriented to person, place, and time. She appears well-developed and well-nourished. No distress.  HENT:  Head: Normocephalic and atraumatic.  Eyes: Conjunctivae and  EOM are normal.  Neck: Normal range of motion. Neck supple. No JVD present. Carotid bruit is not present. No thyromegaly present.  Cardiovascular: Normal rate, regular rhythm and normal heart sounds.   No murmur heard. Pulmonary/Chest: Effort normal and breath sounds normal. No respiratory distress. She has no wheezes. She has no rales. She exhibits no tenderness.  Abdominal: Soft. Bowel sounds are normal. There is no tenderness.  Musculoskeletal: Normal  range of motion. She exhibits no edema or deformity.  Neurological: She is alert and oriented to person, place, and time.  Skin: Skin is warm and dry. She is not diaphoretic.  Psychiatric: She has a normal mood and affect.  Nursing note and vitals reviewed.   BP 128/66 (BP Location: Right Arm, Cuff Size: Large)   Pulse 75   Temp 97.4 F (36.3 C) (Oral)   Resp 18   Ht '5\' 4"'$  (1.626 m)   Wt 265 lb (120.2 kg)   SpO2 96%   BMI 45.49 kg/m  Wt Readings from Last 3 Encounters:  07/12/16 265 lb (120.2 kg)  03/10/16 280 lb (127 kg)  01/16/16 280 lb (127 kg)   BP Readings from Last 3 Encounters:  07/12/16 128/66  03/10/16 (!) 144/84  01/16/16 (!) 144/62     Immunization History  Administered Date(s) Administered  . Influenza Split 02/23/2011, 01/31/2012  . Influenza Whole 01/06/2009  . Influenza, High Dose Seasonal PF 02/05/2014, 12/10/2014, 01/06/2016  . Influenza,inj,Quad PF,36+ Mos 01/17/2013  . Pneumococcal Conjugate-13 02/05/2014  . Pneumococcal Polysaccharide-23 04/29/2010  . Td 03/17/2005  . Zoster 10/16/2007    Health Maintenance  Topic Date Due  . Samul Dada  03/18/2015  . MAMMOGRAM  09/07/2015  . HEMOGLOBIN A1C  07/06/2016  . INFLUENZA VACCINE  11/03/2016  . FOOT EXAM  01/05/2017  . URINE MICROALBUMIN  01/05/2017  . OPHTHALMOLOGY EXAM  06/03/2017  . COLONOSCOPY  11/14/2017  . DEXA SCAN  Completed  . Hepatitis C Screening  Completed  . PNA vac Low Risk Adult  Completed    Lab Results  Component Value Date     WBC 8.3 01/06/2016   HGB 11.0 (L) 01/06/2016   HCT 33.0 (L) 01/06/2016   PLT 124.0 Giant Platelets seen on smear. (L) 01/06/2016   GLUCOSE 136 (H) 07/12/2016   CHOL 116 07/12/2016   TRIG 203.0 (H) 07/12/2016   HDL 27.00 (L) 07/12/2016   LDLDIRECT 52.0 07/12/2016   LDLCALC 46 12/10/2014   ALT 18 07/12/2016   AST 24 07/12/2016   NA 131 (L) 07/12/2016   K 3.5 07/12/2016   CL 95 (L) 07/12/2016   CREATININE 2.62 (H) 07/12/2016   BUN 79 (H) 07/12/2016   CO2 25 07/12/2016   TSH 2.29 05/17/2011   HGBA1C 11.2 Repeated and verified X2. (H) 01/06/2016   MICROALBUR 2.7 (H) 01/06/2016    Lab Results  Component Value Date   TSH 2.29 05/17/2011   Lab Results  Component Value Date   WBC 8.3 01/06/2016   HGB 11.0 (L) 01/06/2016   HCT 33.0 (L) 01/06/2016   MCV 81.5 01/06/2016   PLT 124.0 Giant Platelets seen on smear. (L) 01/06/2016   Lab Results  Component Value Date   NA 131 (L) 07/12/2016   K 3.5 07/12/2016   CO2 25 07/12/2016   GLUCOSE 136 (H) 07/12/2016   BUN 79 (H) 07/12/2016   CREATININE 2.62 (H) 07/12/2016   BILITOT 0.5 07/12/2016   ALKPHOS 105 07/12/2016   AST 24 07/12/2016   ALT 18 07/12/2016   PROT 7.4 07/12/2016   ALBUMIN 3.7 07/12/2016   CALCIUM 9.2 07/12/2016   GFR 19.10 (L) 07/12/2016   Lab Results  Component Value Date   CHOL 116 07/12/2016   Lab Results  Component Value Date   HDL 27.00 (L) 07/12/2016   Lab Results  Component Value Date   LDLCALC 46 12/10/2014   Lab Results  Component Value Date   TRIG 203.0 (H)  07/12/2016   Lab Results  Component Value Date   CHOLHDL 4 07/12/2016   Lab Results  Component Value Date   HGBA1C 11.2 Repeated and verified X2. (H) 01/06/2016         Assessment & Plan:   Problem List Items Addressed This Visit      Unprioritized   CHF (congestive heart failure) ([place])    Needs cardiology f/u      Relevant Medications   metolazone (ZAROXOLYN) 5 MG tablet   torsemide (DEMADEX) 20 MG tablet    potassium chloride (K-DUR,KLOR-CON) 10 MEQ tablet    Other Visit Diagnoses    Hyperlipidemia LDL goal <100    -  Primary   Relevant Medications   metolazone (ZAROXOLYN) 5 MG tablet   torsemide (DEMADEX) 20 MG tablet   Other Relevant Orders   Comprehensive metabolic panel (Completed)   Lipid panel (Completed)   LDL cholesterol, direct (Completed)   Need for shingles vaccine       Depression, unspecified depression type       Relevant Medications   sertraline (ZOLOFT) 50 MG tablet   Encounter for Medicare annual wellness exam       Screening for breast cancer       Relevant Orders   MM Digital Screening      I have discontinued Ms. Assad's furosemide, COLCRYS, ipratropium, isosorbide mononitrate, hydrALAZINE, diclofenac, fenofibrate, and fenofibrate. I have also changed her sertraline. Additionally, I am having her start on Zoster Vac Recomb Adjuvanted. Lastly, I am having her maintain her ergocalciferol, Iron, fluticasone furoate-vilanterol, allopurinol, albuterol, atorvastatin, pantoprazole, glipiZIDE, carvedilol, insulin NPH-regular Human, Insulin Syringe-Needle U-100, pramipexole, busPIRone, metolazone, torsemide, potassium chloride, Fluticasone-Salmeterol, doxycycline, and sulfamethoxazole-trimethoprim.  Meds ordered this encounter  Medications  . metolazone (ZAROXOLYN) 5 MG tablet    Sig: Take 1 tablet weekly 30 minutes prior to furosemide dose  . torsemide (DEMADEX) 20 MG tablet    Sig: Take 80 mg by mouth.  . potassium chloride (K-DUR,KLOR-CON) 10 MEQ tablet    Sig: Take by mouth.  . Fluticasone-Salmeterol (ADVAIR) 250-50 MCG/DOSE AEPB    Sig: Inhale 1 puff into the lungs 2 (two) times daily.  Marland Kitchen doxycycline (VIBRAMYCIN) 100 MG capsule  . sulfamethoxazole-trimethoprim (BACTRIM DS,SEPTRA DS) 800-160 MG tablet    Sig: Take 1 tablet by mouth daily.   Marland Kitchen Zoster Vac Recomb Adjuvanted [facility]) injection    Sig: Inject 0.5 mLs into the muscle once.    Dispense:  1 each     Refill:  1  . sertraline (ZOLOFT) 50 MG tablet    Sig: Take 1.5 tablets (75 mg total) by mouth daily.    Dispense:  135 tablet    Refill:  3    CMA served as scribe during this visit. History, Physical and Plan performed by medical provider. Documentation and orders reviewed and attested to.  Ann Held, DO

## 2016-07-12 NOTE — Assessment & Plan Note (Signed)
Tolerating statin, encouraged heart healthy diet, avoid trans fats, minimize simple carbs and saturated fats. Increase exercise as tolerated 

## 2016-07-12 NOTE — Assessment & Plan Note (Signed)
Well controlled, no changes to meds. Encouraged heart healthy diet such as the DASH diet and exercise as tolerated.  °

## 2016-07-12 NOTE — Progress Notes (Signed)
Pre visit review using our clinic review tool, if applicable. No additional management support is needed unless otherwise documented below in the visit note. 

## 2016-07-12 NOTE — Assessment & Plan Note (Signed)
Per endo °

## 2016-07-12 NOTE — Patient Instructions (Addendum)
Soak finger in warm water with epsom salt.  Keep up the good work with diet and weight loss!!  Call Dr. Jacalyn Lefevre office at [phone] to schedule your follow-up appointment.  You can call the [facility] at [phone] for help installing your smoke detectors.   Advance Directive Advance directives are legal documents that let you make choices ahead of time about your health care and medical treatment in case you become unable to communicate for yourself. Advance directives are a way for you to communicate your wishes to family, friends, and health care providers. This can help convey your decisions about end-of-life care if you become unable to communicate. Discussing and writing advance directives should happen over time rather than all at once. Advance directives can be changed depending on your situation and what you want, even after you have signed the advance directives. If you do not have an advance directive, some states assign family decision makers to act on your behalf based on how closely you are related to them. Each state has its own laws regarding advance directives. You may want to check with your health care provider, attorney, or state representative about the laws in your state. There are different types of advance directives, such as:  Medical power of attorney.  Living will.  Do not resuscitate (DNR) or do not attempt resuscitation (DNAR) order. Health care proxy and medical power of attorney A health care proxy, also called a health care agent, is a person who is appointed to make medical decisions for you in cases in which you are unable to make the decisions yourself. Generally, people choose someone they know well and trust to represent their preferences. Make sure to ask this person for an agreement to act as your proxy. A proxy may have to exercise judgment in the event of a medical decision for which your wishes are not known. A medical power of  attorney is a legal document that names your health care proxy. Depending on the laws in your state, after the document is written, it may also need to be:  Signed.  Notarized.  Dated.  Copied.  Witnessed.  Incorporated into your medical record. You may also want to appoint someone to manage your financial affairs in a situation in which you are unable to do so. This is called a durable power of attorney for finances. It is a separate legal document from the durable power of attorney for health care. You may choose the same person or someone different from your health care proxy to act as your agent in financial matters. If you do not appoint a proxy, or if there is a concern that the proxy is not acting in your best interests, a court-appointed guardian may be designated to act on your behalf. Living will A living will is a set of instructions documenting your wishes about medical care when you cannot express them yourself. Health care providers should keep a copy of your living will in your medical record. You may want to give a copy to family members or friends. To alert caregivers in case of an emergency, you can place a card in your wallet to let them know that you have a living will and where they can find it. A living will is used if you become:  Terminally ill.  Incapacitated.  Unable to communicate or make decisions. Items to consider in your living will include:  The use or non-use of life-sustaining equipment, such as dialysis  machines and breathing machines (ventilators).  A DNR or DNAR order, which is the instruction not to use cardiopulmonary resuscitation (CPR) if breathing or heartbeat stops.  The use or non-use of tube feeding.  Withholding of food and fluids.  Comfort (palliative) care when the goal becomes comfort rather than a cure.  Organ and tissue donation. A living will does not give instructions for distributing your money and property if you should pass  away. It is recommended that you seek the advice of a lawyer when writing a will. Decisions about taxes, beneficiaries, and asset distribution will be legally binding. This process can relieve your family and friends of any concerns surrounding disputes or questions that may come up about the distribution of your assets. DNR or DNAR A DNR or DNAR order is a request not to have CPR in the event that your heart stops beating or you stop breathing. If a DNR or DNAR order has not been made and shared, a health care provider will try to help any patient whose heart has stopped or who has stopped breathing. If you plan to have surgery, talk with your health care provider about how your DNR or DNAR order will be followed if problems occur. Summary  Advance directives are the legal documents that allow you to make choices ahead of time about your health care and medical treatment in case you become unable to communicate for yourself.  The process of discussing and writing advance directives should happen over time. You can change the advance directives, even after you have signed them.  Advance directives include DNR or DNAR orders, living wills, and designating an agent as your medical power of attorney. This information is not intended to replace advice given to you by your health care provider. Make sure you discuss any questions you have with your health care provider. Document Released: 06/29/2007 Document Revised: 02/09/2016 Document Reviewed: 02/09/2016 Elsevier Interactive Patient Education  2017 [employer].

## 2016-07-12 NOTE — Progress Notes (Signed)
Subjective:   Erin [name] is a 71 y.o. female who presents for Medicare Annual (Subsequent) preventive examination.  Review of Systems:  No ROS.  Medicare Wellness Visit.  Cardiac Risk Factors include: diabetes mellitus;dyslipidemia;obesity (BMI >30kg/m2);hypertension;advanced age (>28mn, >>29women);sedentary lifestyle  Sleep patterns: has restless sleep and sleeps about 4 hours nightly. Trying to implement a regular bedtime routine and reports this is helping her sleep. Trying to avoid daytime naps. Wears CPAP nightly.  Home Safety/Smoke Alarms: Feels safe in home. Has smoke detectors, but they are not in working order. Living environment; residence and Firearm Safety: Lives alone. 1-story house/ trailer, number of outside stairs: 3, no firearms. Seat Belt Safety/Bike Helmet: Wears seat belt.   Counseling:   [place] of [place]. [place]  [place]:   Pap- N/A, aged out       [place] last 09/06/13. BI-RADS CATEGORY: 1. Negative.       Dexa scan- last 07/31/12. Normal.        CCS- last 11/15/07. Normal.     Objective:     Vitals: BP 128/66 (BP Location: Right Arm, Cuff Size: Large)   Pulse 75   Temp 97.4 F (36.3 C) (Oral)   Resp 18   Ht '5\' 4"'$  (1.626 m)   Wt 265 lb (120.2 kg)   SpO2 96%   BMI 45.49 kg/m   Body mass index is 45.49 kg/m.  Wt Readings from Last 3 Encounters:  07/12/16 265 lb (120.2 kg)  03/10/16 280 lb (127 kg)  01/16/16 280 lb (127 kg)    Tobacco History  Smoking Status  . Former Smoker  . Packs/day: 1.50  . Years: 20.00  . Types: Cigarettes  . Quit date: 08/02/1984  Smokeless Tobacco  . Never Used     Counseling given: Not Answered   Past Medical History:  Diagnosis Date  . Asthma   . CHF (congestive heart failure) ([facility]    hospital 11[place]2013[place]  . Chronic kidney disease   . Depression   . [place]   .  GERD (gastroesophageal reflux disease)   . Hyperlipidemia   . Hypertension    Past Surgical History:  Procedure Laterality Date  . CHOLECYSTECTOMY  2002  . SPINE SURGERY  aug 2015   [facility]  . TONSILLECTOMY    . TUBAL LIGATION     Family History  Problem Relation Age of Onset  . Hypertension Mother   . Alzheimer's disease Mother   . Hyperlipidemia Mother   . Heart disease Father     cad  . Asthma Father   . Cancer Father 363   leukemia  . Stroke Father   . Hypertension Father   . Heart disease Sister 661   MI  . Pulmonary fibrosis Sister   . Arthritis Brother   . Heart disease Maternal Uncle   . Stroke Maternal Uncle   . Uterine cancer Paternal Grandmother   . Stomach cancer Paternal Grandfather   . Heart disease Maternal Uncle   . Heart disease Maternal Uncle    History  Sexual Activity  . Sexual activity: Not Currently  . Partners: Male    Outpatient Encounter Prescriptions as of 07/12/2016  Medication Sig  . albuterol (PROVENTIL) (2.5 MG/3ML) 0.083% nebulizer solution Take 3 mLs (2.5 mg total) by nebulization every 6 (six) hours as needed for wheezing or shortness  of breath.  . allopurinol (ZYLOPRIM) 100 MG tablet Take 1 tablet by mouth 2 (two) times daily.  Marland Kitchen atorvastatin (LIPITOR) 40 MG tablet TAKE ONE TABLET BY MOUTH ONCE DAILY  . busPIRone (BUSPAR) 15 MG tablet Take 1 tablet (15 mg total) by mouth 3 (three) times daily.  . carvedilol (COREG) 6.25 MG tablet Take 1 tablet (6.25 mg total) by mouth every 12 (twelve) hours. Please schedule appointment for refills.  . doxycycline (VIBRAMYCIN) 100 MG capsule   . ergocalciferol (VITAMIN D2) 50000 UNITS capsule Take 50,000 Units by mouth once a week.  . Ferrous Sulfate (IRON) 325 (65 FE) MG TABS Take 1 tablet by mouth 2 (two) times daily.  . Fluticasone-Salmeterol (ADVAIR) 250-50 MCG/DOSE AEPB Inhale 1 puff into the lungs 2 (two) times daily.  Marland Kitchen glipiZIDE (GLUCOTROL) 10 MG tablet Take 1 tablet (10 mg total)  by mouth 2 (two) times daily before a meal.  . insulin NPH-regular Human (NOVOLIN 70/30 RELION) (70-30) 100 UNIT/ML injection Inject under skin 15-30 min before b'fast and supper: 35-40 units as advised. ReliOn.  . Insulin Syringe-Needle U-100 (B-D INS SYR ULTRAFINE .5CC/30G) 30G X 1/2" 0.5 ML MISC Use 2x a day  . metolazone (ZAROXOLYN) 5 MG tablet Take 1 tablet weekly 30 minutes prior to furosemide dose  . pantoprazole (PROTONIX) 40 MG tablet Take 1 tablet (40 mg total) by mouth daily.  . potassium chloride (K-DUR,KLOR-CON) 10 MEQ tablet Take by mouth.  . pramipexole (MIRAPEX) 1 MG tablet TAKE ONE TABLET BY MOUTH AT BEDTIME  . sertraline (ZOLOFT) 50 MG tablet Take 1.5 tablets (75 mg total) by mouth daily.  Marland Kitchen sulfamethoxazole-trimethoprim (BACTRIM DS,SEPTRA DS) 800-160 MG tablet Take 1 tablet by mouth daily.   Marland Kitchen torsemide (DEMADEX) 20 MG tablet Take 80 mg by mouth.  . Zoster Vac Recomb Adjuvanted [facility]) injection Inject 0.5 mLs into the muscle once.  . [DISCONTINUED] sertraline (ZOLOFT) 50 MG tablet Take 1 tablet (50 mg total) by mouth daily. (Patient taking differently: Take 75 mg by mouth daily. )  . Fluticasone Furoate-Vilanterol (BREO ELLIPTA) 200-25 MCG/INH AEPB Inhale 1 puff into the lungs daily.  . [DISCONTINUED] COLCRYS 0.6 MG tablet Take 1 tablet by mouth daily.  . [DISCONTINUED] diclofenac (CATAFLAM) 50 MG tablet Take 1 tablet (50 mg total) by mouth 2 (two) times daily.  . [DISCONTINUED] fenofibrate 160 MG tablet TAKE ONE TABLET BY MOUTH ONCE DAILY  . [DISCONTINUED] fenofibrate 160 MG tablet TAKE ONE TABLET BY MOUTH ONCE DAILY  . [DISCONTINUED] furosemide (LASIX) 20 MG tablet Take 3 tablets (60 mg total) by mouth daily.  . [DISCONTINUED] hydrALAZINE (APRESOLINE) 50 MG tablet TAKE ONE TABLET BY MOUTH THREE TIMES DAILY  . [DISCONTINUED] ipratropium (ATROVENT) 0.02 % nebulizer solution Take 2.5 mLs (0.5 mg total) by nebulization 4 (four) times daily.  . [DISCONTINUED] isosorbide  mononitrate (IMDUR) 30 MG 24 hr tablet TAKE ONE TABLET BY MOUTH ONCE DAILY   No facility-administered encounter medications on file as of 07/12/2016.     Activities of Daily Living In your present state of health, do you have any difficulty performing the following activities: 07/12/2016 01/06/2016  Hearing? N Y  Vision? N N  Difficulty concentrating or making decisions? N N  Walking or climbing stairs? Y N  Dressing or bathing? N N  Doing errands, shopping? N N  Preparing Food and eating ? N -  Using the Toilet? N -  In the past six months, have you accidently leaked urine? Y -  Do you have  problems with loss of bowel control? N -  Managing your Medications? N -  Managing your Finances? N -  Housekeeping or managing your Housekeeping? N -  Some recent data might be hidden    Patient Care Team: Ann Held, DO as PCP - General Thereasa Distance, MD as Consulting Physician (Nephrology) Philemon Kingdom, MD as Consulting Physician (Internal Medicine) Lelon Perla, MD as Consulting Physician (Cardiology) Gardiner Rhyme, MD as Consulting Physician (Pulmonary Disease)    Assessment:    Physical assessment deferred to PCP.  Exercise Activities and Dietary recommendations Current Exercise Habits: Home exercise routine, Type of exercise: walking  Diet (meal preparation, eat out, water intake, caffeinated beverages, dairy products, fruits and vegetables): on average, 2-3 meals per day. Reports she is trying to get "smarter" about diet. She is trying to reduce portion sizes at each meal. She has noticed improved blood sugars since making these changes.   Goals    . Increase physical activity    . Reduce portion size      Fall Risk Fall Risk  07/12/2016 01/06/2016 05/24/2014 05/01/2013 01/17/2013  Falls in the past year? Yes No No No No  Number falls in past yr: 1 - - - -  Risk for fall due to : Impaired balance/gait - - - -   Depression Screen PHQ 2/9 Scores 07/12/2016  01/06/2016 05/24/2014 05/01/2013  PHQ - 2 Score 0 0 0 0     Cognitive Function       Ad8 score reviewed for issues:  Issues making decisions: No  Less interest in hobbies / activities: No  Repeats questions, stories (family complaining): No  Trouble using ordinary gadgets (microwave, computer, phone): No  Forgets the month or year: No  Mismanaging finances: No   Remembering appts: No  Daily problems with thinking and/or memory: No Ad8 score is= 0   Immunization History  Administered Date(s) Administered  . Influenza Split 02/23/2011, 01/31/2012  . Influenza Whole 01/06/2009  . Influenza, High Dose Seasonal PF 02/05/2014, 12/10/2014, 01/06/2016  . Influenza,inj,Quad PF,36+ Mos 01/17/2013  . Pneumococcal Conjugate-13 02/05/2014  . Pneumococcal Polysaccharide-23 04/29/2010  . Td 03/17/2005  . Zoster 10/16/2007   Screening Tests Health Maintenance  Topic Date Due  . TETANUS/TDAP  03/18/2015  . MAMMOGRAM  09/07/2015  . HEMOGLOBIN A1C  07/06/2016  . INFLUENZA VACCINE  11/03/2016  . FOOT EXAM  01/05/2017  . URINE MICROALBUMIN  01/05/2017  . OPHTHALMOLOGY EXAM  06/03/2017  . COLONOSCOPY  11/14/2017  . DEXA SCAN  Completed  . Hepatitis C Screening  Completed  . PNA vac Low Risk Adult  Completed      Plan:    Follow-up w/ PCP as directed.  Schedule follow-up appt w/ Dr. Stanford Breed.  Create and bring a copy of your advance directives to your next office visit.  Applauded patient's efforts and success w/ weight loss. She is down 15 lbs since December.  Orders placed for MMG. MMG scheduled w/ [facility] for 07/20/16 @ 10:45 w/ 10:15am arrival time. Patient notified of appointment by phone 07/13/16.  Phone number for local fire department provided for assistance in changing smoke detectors.  During the course of the visit the patient was educated and counseled about the following appropriate screening and preventive services:   Vaccines to include Pneumoccal,  Influenza,  Td, HCV  Cardiovascular Disease  Colorectal cancer screening  Diabetes screening  Glaucoma screening  Mammography  Nutrition counseling   Patient Instructions (the written plan) was  given to the patient.   Dorrene German, RN  07/12/2016

## 2016-07-15 ENCOUNTER — Other Ambulatory Visit: Payer: Self-pay | Admitting: Family Medicine

## 2016-07-15 DIAGNOSIS — E785 Hyperlipidemia, unspecified: Secondary | ICD-10-CM

## 2016-07-16 ENCOUNTER — Ambulatory Visit: Payer: Self-pay | Admitting: Internal Medicine

## 2016-07-18 NOTE — Assessment & Plan Note (Signed)
Needs cardiology f/u

## 2016-07-20 ENCOUNTER — Other Ambulatory Visit: Payer: Self-pay | Admitting: Cardiology

## 2016-07-20 DIAGNOSIS — Z1231 Encounter for screening mammogram for malignant neoplasm of breast: Secondary | ICD-10-CM | POA: Diagnosis not present

## 2016-07-20 DIAGNOSIS — N179 Acute kidney failure, unspecified: Secondary | ICD-10-CM | POA: Diagnosis not present

## 2016-07-20 LAB — HM MAMMOGRAPHY

## 2016-07-20 NOTE — Telephone Encounter (Signed)
Rx request sent to pharmacy.  

## 2016-07-23 ENCOUNTER — Ambulatory Visit (INDEPENDENT_AMBULATORY_CARE_PROVIDER_SITE_OTHER): Payer: Medicare Other | Admitting: Internal Medicine

## 2016-07-23 ENCOUNTER — Encounter: Payer: Self-pay | Admitting: Internal Medicine

## 2016-07-23 VITALS — BP 142/60 | HR 87 | Temp 97.5°F | Resp 18 | Ht 64.0 in | Wt 266.4 lb

## 2016-07-23 DIAGNOSIS — Z794 Long term (current) use of insulin: Secondary | ICD-10-CM

## 2016-07-23 DIAGNOSIS — E1121 Type 2 diabetes mellitus with diabetic nephropathy: Secondary | ICD-10-CM

## 2016-07-23 LAB — POCT GLYCOSYLATED HEMOGLOBIN (HGB A1C): Hemoglobin A1C: 12.4

## 2016-07-23 MED ORDER — "INSULIN SYRINGE-NEEDLE U-100 30G X 1/2"" 0.5 ML MISC": 3 refills | Status: DC

## 2016-07-23 MED ORDER — INSULIN NPH ISOPHANE & REGULAR (70-30) 100 UNIT/ML ~~LOC~~ SUSP: SUBCUTANEOUS | 5 refills | Status: DC

## 2016-07-23 NOTE — Progress Notes (Signed)
Pre visit review using our clinic review tool, if applicable. No additional management support is needed unless otherwise documented below in the visit note. 

## 2016-07-23 NOTE — Progress Notes (Signed)
Patient ID: Erin [name], female   DOB: 1946/01/14, 71 y.o.   MRN: [id number]  HPI: Erin [name] is a 71 y.o.-year-old female, returning for f/u for DM2, dx 2013, insulin-dependent, uncontrolled, with complications (CKD). Last visit 4.5 mo ago.  She lost 15 lbs since last visit.  Last hemoglobin A1c was: Lab Results  Component Value Date   HGBA1C 11.2 Repeated and verified X2. (H) 01/06/2016   HGBA1C 8.4 05/01/2015   HGBA1C 9.0 (H) 12/10/2014   HGBA1C 5.8 01/11/2014   HGBA1C 6.8 (H) 08/02/2013   HGBA1C 7.1 (H) 07/05/2013   HGBA1C 7.2 (H) 01/17/2013   HGBA1C 6.7 (H) 11/02/2012   HGBA1C 6.6 (H) 07/18/2012   HGBA1C 6.6 (H) 04/19/2012   Pt was on a regimen of: - Glipizide 10 mg bid - Lantus 27 units in am  Now on: - Glipizide 10 mg 2x a day before meals. - Novolin ReliOn 70/30 - 15-30 min before b'fast and supper  - 35 units for a smaller meal  - 40 units for a larger meal  She had to stop Tradjenta 5 mg for 1 year b/c price >> restarted now but still very expensive.  Had to stop Metformin 2/2 AKI after her back sx.  Pt checks her sugars 1-2 a day (again no log, no meter): - am: 117-148 >> 100-140 >> 120-140 >> >200 >> 197-250 >> 200-300s >> 270s >> 150-200 - 2h after b'fast: 79-140 >> <140 >> n/c >> 300s >> 185-220 >> n/c - before lunch: 124-200 >> 100-115 >> n/c >> 200-244 >> n/c - 2h after lunch: 137-200 >> 150-170s >> 140s-150s >> 300s-400 >> 207-244 >> n/c - before dinner: 120-200 >> 130s >> 148, 189 >> n/c >> 208 >> n/c  >> 280-350 >> 200-300 - 2h after dinner: 1h after dinner: 180-200 >> 200-300 >> 187-259, 280 >> n/c >> 290s >> n/c - bedtime: 95-126 >> n/c  Lowest sugar was 90s >> 150; she has hypoglycemia awareness at 70.  Highest sugar was 200s >> 400 >> 300s >> 350 >> 400s (this am - overate yesterday).  Meals: - Breakfast: eggs + toast; occas. grits - Lunch: sandwich, soup - Dinner: meat + veggies + starch or sandwich - Snacks: grapes, icecream   - +  CKD, last BUN/creatinine:  Lab Results  Component Value Date   BUN 79 (H) 07/12/2016   CREATININE 2.62 (H) 07/12/2016  11/30/2013 [facility] - Cr 1.8  On Lisinopril. - last set of lipids: Lab Results  Component Value Date   CHOL 116 07/12/2016   HDL 27.00 (L) 07/12/2016   LDLCALC 46 12/10/2014   LDLDIRECT 52.0 07/12/2016   TRIG 203.0 (H) 07/12/2016   CHOLHDL 4 07/12/2016  On Lipitor. - last eye exam was in ~06/2016. No DR. She has a h/o Cataracts. She had R cataract sx. - + numbness and tingling in her  big toe, not feet. She has swelling in feet 2/2 heat.   She also has a history of HL, asthma, depression, vit D def.   I reviewed pt's medications, allergies, PMH, social hx, family hx, and changes were documented in the history of present illness. Otherwise, unchanged from my initial visit note.  ROS: Constitutional: + weight gain (noy our scale), + fatigue, no subjective hyperthermia/hypothermia, + nocturia Eyes: no blurry vision, no xerophthalmia ENT: no sore throat, no nodules palpated in throat, no dysphagia/odynophagia, no hoarseness Cardiovascular: no CP/+ SOB/no palpitations/+ leg swelling Respiratory: + all: cough/SOB/wheezing Gastrointestinal: no N/V/D/C Musculoskeletal: +  both: muscle/joint aches Skin: no rashes Neurological: no tremors/numbness/tingling/dizziness  PE: BP (!) 142/60 (BP Location: Right Arm, Patient Position: Sitting, Cuff Size: Large)   Pulse 87   Temp 97.5 F (36.4 C) (Oral)   Resp 18   Ht '5\' 4"'$  (1.626 m)   Wt 266 lb 6.4 oz (120.8 kg)   SpO2 94%   BMI 45.73 kg/m  Body mass index is 45.73 kg/m. Wt Readings from Last 3 Encounters:  07/23/16 266 lb 6.4 oz (120.8 kg)  07/12/16 265 lb (120.2 kg)  03/10/16 280 lb (127 kg)   Constitutional: overweight, in NAD Eyes: PERRLA, EOMI, no exophthalmos ENT: moist mucous membranes, no thyromegaly, no cervical lymphadenopathy Cardiovascular: RRR, No MRG, + marked pitting edema  bilat. Respiratory: CTA B Gastrointestinal: abdomen soft, NT, ND, BS+ Musculoskeletal: no deformities, strength intact in all 4 Skin: moist, warm, no rashes Neurological: no tremor with outstretched hands, DTR normal in all 4   ASSESSMENT: 1. DM2, insulin-dependent, uncontrolled, with complications - CKD  PLAN:  1. Patient with long-standing, previously well controlled diabetes, with still terrible control. We had to change her insulin to 70/30 premixed insulin b/c she could not afford the insulin (doughnut hole).  - she again does not bring a sugar log or meter >> advised to do so at next visit - she tells me she reduced her portions in last few mo >> sugars better >> now back to eating larger portions and sweets  - we discussed about diet >> suggested a plant based diet >> given references >> she agrees to try - will also add a 70/30 insulin dose before lunch >> advised to start at 20 units today >> increase to 35-40 units starting tomorros - I advised her to:  Patient Instructions  Read the following books: Dr. Bertrum Sol - Program for Reversing Diabetes Dr. Karl Luke - Prevent and Reverse Heart Disease Dr. Alden Benjamin - How Not to Die  Please continue: - Glipizide 10 mg 2x a day before meals.  Please change the insulin doses as follows >> add a dose before lunch: - Novolin ReliOn 70/30 - 15-30 min before meals - 35 units for a smaller meal  - 40 units for a larger meal  Target blood sugars for you: - before meals: 100-140 - after meals: <200  - again advised to check sugars at different times of the day - check 2-3 times a day, rotating checks  - UTD with eye exams - Return to clinic in 3 mo with sugar log   Philemon Kingdom, MD PhD [facility] End[facility]

## 2016-07-23 NOTE — Patient Instructions (Addendum)
Read the following books: Dr. Bertrum Sol - Program for Reversing Diabetes Dr. Karl Luke - Prevent and Reverse Heart Disease Dr. Alden Benjamin - How Not to Die  Please continue: - Glipizide 10 mg 2x a day before meals.  Please change the insulin doses as follows >> add a dose before lunch: - Novolin ReliOn 70/30 - 15-30 min before meals - 35 units for a smaller meal  - 40 units for a larger meal  Target blood sugars for you: - before meals: 100-140 - after meals: <200

## 2016-07-26 DIAGNOSIS — K1329 Other disturbances of oral epithelium, including tongue: Secondary | ICD-10-CM | POA: Diagnosis not present

## 2016-08-04 DIAGNOSIS — I509 Heart failure, unspecified: Secondary | ICD-10-CM | POA: Diagnosis not present

## 2016-08-04 DIAGNOSIS — Z794 Long term (current) use of insulin: Secondary | ICD-10-CM | POA: Diagnosis not present

## 2016-08-04 DIAGNOSIS — I130 Hypertensive heart and chronic kidney disease with heart failure and stage 1 through stage 4 chronic kidney disease, or unspecified chronic kidney disease: Secondary | ICD-10-CM | POA: Diagnosis not present

## 2016-08-04 DIAGNOSIS — Z87891 Personal history of nicotine dependence: Secondary | ICD-10-CM | POA: Diagnosis not present

## 2016-08-04 DIAGNOSIS — Z79899 Other long term (current) drug therapy: Secondary | ICD-10-CM | POA: Diagnosis not present

## 2016-08-04 DIAGNOSIS — K148 Other diseases of tongue: Secondary | ICD-10-CM | POA: Diagnosis not present

## 2016-08-04 DIAGNOSIS — J45909 Unspecified asthma, uncomplicated: Secondary | ICD-10-CM | POA: Diagnosis not present

## 2016-08-04 DIAGNOSIS — E039 Hypothyroidism, unspecified: Secondary | ICD-10-CM | POA: Diagnosis not present

## 2016-08-04 DIAGNOSIS — E1122 Type 2 diabetes mellitus with diabetic chronic kidney disease: Secondary | ICD-10-CM | POA: Diagnosis not present

## 2016-08-04 DIAGNOSIS — K149 Disease of tongue, unspecified: Secondary | ICD-10-CM | POA: Diagnosis not present

## 2016-08-04 DIAGNOSIS — N183 Chronic kidney disease, stage 3 (moderate): Secondary | ICD-10-CM | POA: Diagnosis not present

## 2016-08-10 ENCOUNTER — Other Ambulatory Visit: Payer: Self-pay | Admitting: Family Medicine

## 2016-08-10 DIAGNOSIS — K219 Gastro-esophageal reflux disease without esophagitis: Secondary | ICD-10-CM

## 2016-08-11 DIAGNOSIS — L03115 Cellulitis of right lower limb: Secondary | ICD-10-CM | POA: Diagnosis not present

## 2016-08-11 DIAGNOSIS — L0390 Cellulitis, unspecified: Secondary | ICD-10-CM | POA: Diagnosis not present

## 2016-08-16 DIAGNOSIS — R110 Nausea: Secondary | ICD-10-CM | POA: Diagnosis not present

## 2016-08-16 DIAGNOSIS — Z8679 Personal history of other diseases of the circulatory system: Secondary | ICD-10-CM | POA: Insufficient documentation

## 2016-08-16 DIAGNOSIS — G4733 Obstructive sleep apnea (adult) (pediatric): Secondary | ICD-10-CM | POA: Diagnosis not present

## 2016-08-16 DIAGNOSIS — T50995A Adverse effect of other drugs, medicaments and biological substances, initial encounter: Secondary | ICD-10-CM | POA: Diagnosis not present

## 2016-08-16 DIAGNOSIS — E1165 Type 2 diabetes mellitus with hyperglycemia: Secondary | ICD-10-CM | POA: Diagnosis not present

## 2016-08-23 ENCOUNTER — Ambulatory Visit: Payer: Self-pay | Admitting: Physician Assistant

## 2016-08-23 ENCOUNTER — Ambulatory Visit (INDEPENDENT_AMBULATORY_CARE_PROVIDER_SITE_OTHER): Payer: Medicare Other | Admitting: Physician Assistant

## 2016-08-23 ENCOUNTER — Encounter (INDEPENDENT_AMBULATORY_CARE_PROVIDER_SITE_OTHER): Payer: Self-pay

## 2016-08-23 ENCOUNTER — Encounter: Payer: Self-pay | Admitting: Physician Assistant

## 2016-08-23 ENCOUNTER — Other Ambulatory Visit: Payer: Self-pay | Admitting: Family Medicine

## 2016-08-23 VITALS — BP 122/62 | HR 96 | Ht 64.0 in | Wt 259.0 lb

## 2016-08-23 DIAGNOSIS — E785 Hyperlipidemia, unspecified: Secondary | ICD-10-CM

## 2016-08-23 DIAGNOSIS — N289 Disorder of kidney and ureter, unspecified: Secondary | ICD-10-CM

## 2016-08-23 DIAGNOSIS — N189 Chronic kidney disease, unspecified: Secondary | ICD-10-CM | POA: Diagnosis not present

## 2016-08-23 DIAGNOSIS — E119 Type 2 diabetes mellitus without complications: Secondary | ICD-10-CM | POA: Diagnosis not present

## 2016-08-23 DIAGNOSIS — I10 Essential (primary) hypertension: Secondary | ICD-10-CM | POA: Diagnosis not present

## 2016-08-23 DIAGNOSIS — IMO0001 Reserved for inherently not codable concepts without codable children: Secondary | ICD-10-CM

## 2016-08-23 DIAGNOSIS — Z01818 Encounter for other preprocedural examination: Secondary | ICD-10-CM | POA: Diagnosis not present

## 2016-08-23 DIAGNOSIS — Z794 Long term (current) use of insulin: Secondary | ICD-10-CM

## 2016-08-23 DIAGNOSIS — I5032 Chronic diastolic (congestive) heart failure: Secondary | ICD-10-CM

## 2016-08-23 NOTE — Patient Instructions (Signed)
You have been cleared for surgery! We will fax a letter to Dr Servando [place] office!  Erin [name], [place] recommends that you schedule a follow-up appointment in 3 months with Dr Stanford Breed.  If you need a refill on your cardiac medications before your next appointment, please call your pharmacy.

## 2016-08-23 NOTE — Progress Notes (Signed)
Cardiology Office Note    Date:  08/23/2016   ID:  Pollyanna, Levay 1946/02/11, MRN [id number]  PCP:  Carollee Herter, Alferd Apa, DO  Cardiologist:  Dr. Stanford Breed  Chief Complaint  Patient presents with  . Pre-op Exam    seen for Dr. Stanford Breed. Preoperative clearance for tongue surgery by Dr. Nicolette Bang   Preoperative clearance requested by Dr. Nicolette Bang for surgery of tongue dysplasia.   History of Present Illness:  GUY TONEY is a 71 y.o. 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 Benjiman Core and chronic diastolic HF. She was initially admitted in [facility] in August 2015 with a malignant hypertension, dyspnea and chest pain. She also had acute renal failure at the time. Cardiac markers were negative. Echocardiogram demonstrated EF 55-60%, and mild pulmonary hypertension. A VQ scan was negative. Echocardiogram obtained in December 2015 showed normal LV function and mild left atrial enlargement. Nuclear study obtained in March 2016 showed EF 76% with normal perfusion. She was last seen in the cardiology office on 01/13/2015, she was doing well at this time. Based on the recent lab results, it appears her creatinine jumped from 1.5 in October 2017 up to 2.62 in April 2018. She was referred to [facility]. Her most recent hemoglobin A1c was 12.4. On further investigation, it appears she has been followed by Dr. Olivia Mackie with [facility] nephrology. Compared to her last cardiology office visit in 2016, her Lasix has increased from 60 mg daily to 80 mg twice a day with addition of twice weekly dosing of metolazone in 01/2016. Her nephrologist had later decreased to metolazone dose to once a week due to worsening renal function.  She presents today for preoperative clearance requested by Dr. Nicolette Bang for dysplasia of the tongue. She has recently lost roughly 15 pounds. She is aware of her worsening renal  function and has been followed closely by her nephrologist. I think she was getting over diuresed and became dehydrated. His baseline creatinine was 1.5 back in October 2017. His metolazone has been cut back to once weekly dose. She says she did have another lab that was done at her nephrologist office in April 2018 after it was initially noted her creatinine went up to 2.6. However I am unable to see this lab result. I encouraged her to call follow-up closely with her nephrologist. She does not appears to be volume overloaded on physical exam today, she did have 2 blisters in the right lower extremity. There is some mild redness, however I did not appreciate significant warmth in the area. I asked her to continue to monitor and if it's worse discussed with her primary care physician to make sure there is no sign of cellulitis. Despite the lower extremity blister, she has otherwise no significant lower extremity pitting edema, no JVD, and her lung is clear on physical exam, all suggesting euvolemic status. She is cleared from cardiology perspective to proceed with surgery. She denies any chest pain or ever having any history of MI. She is relatively low risk patient for low risk procedure. She had a negative Myoview in March 2016 that was reassuring. I did not recommend any additional cardiac workup.   Past Medical History:  Diagnosis Date  . Asthma   . CHF (congestive heart failure) [facility])    hospital 11/2013-- [place]  . Chronic kidney disease   . Depression   . Diabetes  mellitus ([place])   . GERD (gastroesophageal reflux disease)   . Hyperlipidemia   . Hypertension     Past Surgical History:  Procedure Laterality Date  . CHOLECYSTECTOMY  2002  . SPINE SURGERY  aug 2015   [facility]  . TONSILLECTOMY    . TUBAL LIGATION      Current Medications: Outpatient Medications Prior to Visit  Medication Sig Dispense Refill  . albuterol (PROVENTIL) (2.5 MG/3ML) 0.083% nebulizer solution  Take 3 mLs (2.5 mg total) by nebulization every 6 (six) hours as needed for wheezing or shortness of breath. 75 mL 12  . allopurinol (ZYLOPRIM) 100 MG tablet Take 1 tablet by mouth 2 (two) times daily.    Marland Kitchen atorvastatin (LIPITOR) 40 MG tablet TAKE ONE TABLET BY MOUTH ONCE DAILY 90 tablet 1  . busPIRone (BUSPAR) 15 MG tablet Take 1 tablet (15 mg total) by mouth 3 (three) times daily. 90 tablet 1  . carvedilol (COREG) 6.25 MG tablet Take 1 tablet (6.25 mg total) by mouth every 12 (twelve) hours. Please schedule appointment for refills. 180 tablet 0  . doxycycline (VIBRAMYCIN) 100 MG capsule Take 100 mg by mouth 2 (two) times daily.     . ergocalciferol (VITAMIN D2) 50000 UNITS capsule Take 50,000 Units by mouth once a week.    . Ferrous Sulfate (IRON) 325 (65 FE) MG TABS Take 1 tablet by mouth 2 (two) times daily.    . Fluticasone Furoate-Vilanterol (BREO ELLIPTA) 200-25 MCG/INH AEPB Inhale 1 puff into the lungs daily.    . Fluticasone-Salmeterol (ADVAIR) 250-50 MCG/DOSE AEPB Inhale 1 puff into the lungs 2 (two) times daily.    Marland Kitchen glipiZIDE (GLUCOTROL) 10 MG tablet Take 1 tablet (10 mg total) by mouth 2 (two) times daily before a meal. 180 tablet 3  . insulin NPH-regular Human (NOVOLIN 70/30 RELION) (70-30) 100 UNIT/ML injection Inject under skin 15-30 min before b'fast and supper: 35-40 units 3x a day. ReliOn. 30 mL 5  . Insulin Syringe-Needle U-100 (B-D INS SYR ULTRAFINE .5CC/30G) 30G X 1/2" 0.5 ML MISC Use 3x a day 300 each 3  . metolazone (ZAROXOLYN) 5 MG tablet Take 1 tablet weekly 30 minutes prior to furosemide dose    . pantoprazole (PROTONIX) 40 MG tablet TAKE ONE TABLET BY MOUTH ONCE DAILY 90 tablet 1  . potassium chloride (K-DUR,KLOR-CON) 10 MEQ tablet Take by mouth.    . pramipexole (MIRAPEX) 1 MG tablet TAKE ONE TABLET BY MOUTH AT BEDTIME 30 tablet 5  . sertraline (ZOLOFT) 50 MG tablet Take 1.5 tablets (75 mg total) by mouth daily. 135 tablet 3  . sulfamethoxazole-trimethoprim (BACTRIM  DS,SEPTRA DS) 800-160 MG tablet Take 1 tablet by mouth daily.     Marland Kitchen torsemide (DEMADEX) 20 MG tablet Take 80 mg by mouth.     No facility-administered medications prior to visit.      Allergies:   Patient has no known allergies.   Social History   Social History  . Marital status: Widowed    Spouse name: N/A  . Number of children: 4  . Years of education: N/A   Occupational History  . 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 Smoking status: Former Smoker    Packs/day: 1.50    Years: 20.00    Types: Cigarettes    Quit date: 08/02/1984  . Smokeless tobacco: Never Used  . Alcohol use No  . Drug use: No  . Sexual activity: Not Currently  Partners: Male   Other Topics Concern  . None   Social History Narrative   NO REG EXERCISE   Caffeine use: daily     Family History:  The patient's family history includes Alzheimer's disease in her mother; Arthritis in her brother; Asthma in her father; Cancer (age of onset: 33) in her father; Heart disease in her father, maternal uncle, maternal uncle, and maternal uncle; Heart disease (age of onset: 51) in her sister; Hyperlipidemia in her mother; Hypertension in her father and mother; Pulmonary fibrosis in her sister; Stomach cancer in her paternal grandfather; Stroke in her father and maternal uncle; Uterine cancer in her paternal grandmother.   ROS:   Please see the history of present illness.    ROS All other systems reviewed and are negative.   PHYSICAL EXAM:   VS:  BP 122/62   Pulse 96   Ht '5\' 4"'$  (1.626 m)   Wt 259 lb (117.5 kg)   SpO2 96%   BMI 44.46 kg/m    GEN: Well nourished, well developed, in no acute distress  HEENT: normal  Neck: no JVD, carotid bruits, or masses Cardiac: RRR; no murmurs, rubs, or gallops,no edema  Respiratory:  clear to auscultation bilaterally, normal work of breathing GI: soft, nontender, nondistended, + BS MS: no deformity or atrophy.  2 small 1.5 cm sized clear  blisters Skin: warm and dry, no rash Neuro:  Alert and Oriented x 3, Strength and sensation are intact Psych: euthymic mood, full affect  Wt Readings from Last 3 Encounters:  08/23/16 259 lb (117.5 kg)  07/23/16 266 lb 6.4 oz (120.8 kg)  07/12/16 265 lb (120.2 kg)      Studies/Labs Reviewed:   EKG:  EKG is ordered today.  The ekg ordered today demonstrates NSR with RBBB   Recent Labs: 01/06/2016: Hemoglobin 11.0; Platelets 124.0 Giant Platelets seen on smear. 07/12/2016: ALT 18; BUN 79; Creatinine, Ser 2.62; Potassium 3.5; Sodium 131   Lipid Panel    Component Value Date/Time   CHOL 116 07/12/2016 1150   TRIG 203.0 (H) 07/12/2016 1150   HDL 27.00 (L) 07/12/2016 1150   CHOLHDL 4 07/12/2016 1150   VLDL 40.6 (H) 07/12/2016 1150   LDLCALC 46 12/10/2014 1126   LDLDIRECT 52.0 07/12/2016 1150    Additional studies/ records that were reviewed today include:     Echo 03/25/2014 LV EF: 55% -  60%  Study Conclusions  - Left ventricle: The cavity size was normal. Systolic function was normal. The estimated ejection fraction was in the range of 55% to 60%. Wall motion was normal; there were no regional wall motion abnormalities. Left ventricular diastolic function parameters were normal. - Mitral valve: Calcified annulus. - Left atrium: The atrium was mildly dilated. - Atrial septum: A patent foramen ovale cannot be excluded.   Myoview 06/12/2014       ASSESSMENT:    1. Preoperative clearance   2. Chronic diastolic heart failure ([place])   3. Essential hypertension   4. Hyperlipidemia, unspecified hyperlipidemia type   5. IDDM (insulin dependent diabetes mellitus) ([place])   6. Morbid obesity ([place])   7. Acute on chronic renal insufficiency      PLAN:  In order of problems listed above:  1. Preoperative clearance requested by Dr. Nicolette Bang for Tongue dysplasia  - She appears to be euvolemic on physical exam. She never had history of MI. Last Myoview in early  2016 was normal. She has not had any recent chest discomfort. She is  cleared from cardiology perspective to proceed with surgery without further workup, she is considered a relatively low risk patient for a low risk procedure.  2. Chronic diastolic HF: She does have 2 blisters in her anterior right lower extremity, I do not think she is volume overloaded though. She has no significant lower extremity edema, her longest clear, she has no JVD on physical exam. Recent lab work seems to suggest possibility of overdiuresis with rise of creatinine from 1.5 up to 2.6. She is being followed closely by her nephrologist. Since the last time she has been to the cardiology clinic, there has been significant change to her diuretic. Her Lasix was increased to 80 mg twice a day and in October 2017, 5 mg twice weekly dose of metolazone was added, in November 2017, her renal function declined and metolazone was cut back to once weekly. In April 2018, her creatinine went up to 2.6. Patient says her nephrologist has obtained additional lab since, however I am unable to locate this lab. We'll defer to her nephrologist regarding adjustment of diuretic. She may need to hold metolazone completely if her renal function continued to decline.    3. Hypertension: Blood pressure 122/62.  4. Hyperlipidemia: Continue Lipitor 40 mg daily.  5. IDDM: followed by Dr. Benjiman Core, recent hemoglobin A1c obtained on 07/23/2016 was 12.4. Question compliance  6. Acute on chronic renal insufficiency: followed by  Dr. Olivia Mackie with [facility] nephrology. Baseline creatinine 1.5, she was started on Lasix 80 mg twice a day with 5 mg twice weekly dosing of metolazone in October 2017, since then, she has had worsening renal function. Her Lasix has been switched to torsemide 80 mg daily, her metolazone has been cut back to 5 mg daily. She will need to follow-up with her nephrologist closely    Medication Adjustments/Labs and Tests  Ordered: Current medicines are reviewed at length with the patient today.  Concerns regarding medicines are outlined above.  Medication changes, Labs and Tests ordered today are listed in the Patient Instructions below. Patient Instructions  You have been cleared for surgery! We will fax a letter to Dr Servando Salina office!  Almyra Deforest, [place] recommends that you schedule a follow-up appointment in 3 months with Dr Stanford Breed.  If you need a refill on your cardiac medications before your next appointment, please call your pharmacy.    Hilbert Corrigan, [place]  08/23/2016 10:18 PM    [facility] [address], [place], [place]  [address] Phone: [phone]; Fax: [phone]

## 2016-08-24 DIAGNOSIS — J4520 Mild intermittent asthma, uncomplicated: Secondary | ICD-10-CM | POA: Diagnosis not present

## 2016-08-24 DIAGNOSIS — G4733 Obstructive sleep apnea (adult) (pediatric): Secondary | ICD-10-CM | POA: Diagnosis not present

## 2016-08-24 DIAGNOSIS — R5383 Other fatigue: Secondary | ICD-10-CM | POA: Diagnosis not present

## 2016-08-24 DIAGNOSIS — J310 Chronic rhinitis: Secondary | ICD-10-CM | POA: Diagnosis not present

## 2016-08-25 NOTE — Addendum Note (Signed)
Addended by: Milderd Meager on: 08/25/2016 11:37 AM   Modules accepted: Orders

## 2016-08-27 DIAGNOSIS — G4733 Obstructive sleep apnea (adult) (pediatric): Secondary | ICD-10-CM | POA: Diagnosis not present

## 2016-08-27 DIAGNOSIS — I130 Hypertensive heart and chronic kidney disease with heart failure and stage 1 through stage 4 chronic kidney disease, or unspecified chronic kidney disease: Secondary | ICD-10-CM | POA: Diagnosis not present

## 2016-08-27 DIAGNOSIS — N189 Chronic kidney disease, unspecified: Secondary | ICD-10-CM | POA: Diagnosis not present

## 2016-08-27 DIAGNOSIS — M109 Gout, unspecified: Secondary | ICD-10-CM | POA: Diagnosis not present

## 2016-08-27 DIAGNOSIS — K1321 Leukoplakia of oral mucosa, including tongue: Secondary | ICD-10-CM | POA: Diagnosis not present

## 2016-08-27 DIAGNOSIS — D631 Anemia in chronic kidney disease: Secondary | ICD-10-CM | POA: Diagnosis not present

## 2016-08-27 DIAGNOSIS — I509 Heart failure, unspecified: Secondary | ICD-10-CM | POA: Diagnosis not present

## 2016-08-27 DIAGNOSIS — Z22322 Carrier or suspected carrier of Methicillin resistant Staphylococcus aureus: Secondary | ICD-10-CM | POA: Diagnosis not present

## 2016-08-27 DIAGNOSIS — G2581 Restless legs syndrome: Secondary | ICD-10-CM | POA: Diagnosis not present

## 2016-08-27 DIAGNOSIS — E785 Hyperlipidemia, unspecified: Secondary | ICD-10-CM | POA: Diagnosis not present

## 2016-08-27 DIAGNOSIS — K219 Gastro-esophageal reflux disease without esophagitis: Secondary | ICD-10-CM | POA: Diagnosis not present

## 2016-08-27 DIAGNOSIS — Z794 Long term (current) use of insulin: Secondary | ICD-10-CM | POA: Diagnosis not present

## 2016-08-27 DIAGNOSIS — J45909 Unspecified asthma, uncomplicated: Secondary | ICD-10-CM | POA: Diagnosis not present

## 2016-08-27 DIAGNOSIS — F419 Anxiety disorder, unspecified: Secondary | ICD-10-CM | POA: Diagnosis not present

## 2016-08-27 DIAGNOSIS — K1329 Other disturbances of oral epithelium, including tongue: Secondary | ICD-10-CM | POA: Diagnosis not present

## 2016-08-27 DIAGNOSIS — E1122 Type 2 diabetes mellitus with diabetic chronic kidney disease: Secondary | ICD-10-CM | POA: Diagnosis not present

## 2016-08-28 DIAGNOSIS — I130 Hypertensive heart and chronic kidney disease with heart failure and stage 1 through stage 4 chronic kidney disease, or unspecified chronic kidney disease: Secondary | ICD-10-CM | POA: Diagnosis not present

## 2016-08-28 DIAGNOSIS — K1329 Other disturbances of oral epithelium, including tongue: Secondary | ICD-10-CM | POA: Diagnosis not present

## 2016-08-28 DIAGNOSIS — I509 Heart failure, unspecified: Secondary | ICD-10-CM | POA: Diagnosis not present

## 2016-08-28 DIAGNOSIS — E1122 Type 2 diabetes mellitus with diabetic chronic kidney disease: Secondary | ICD-10-CM | POA: Diagnosis not present

## 2016-08-28 DIAGNOSIS — Z794 Long term (current) use of insulin: Secondary | ICD-10-CM | POA: Diagnosis not present

## 2016-08-28 DIAGNOSIS — N189 Chronic kidney disease, unspecified: Secondary | ICD-10-CM | POA: Diagnosis not present

## 2016-09-01 ENCOUNTER — Encounter: Payer: Self-pay | Admitting: *Deleted

## 2016-09-14 DIAGNOSIS — Z87891 Personal history of nicotine dependence: Secondary | ICD-10-CM | POA: Diagnosis not present

## 2016-09-14 DIAGNOSIS — K1321 Leukoplakia of oral mucosa, including tongue: Secondary | ICD-10-CM | POA: Diagnosis not present

## 2016-09-14 DIAGNOSIS — Z48815 Encounter for surgical aftercare following surgery on the digestive system: Secondary | ICD-10-CM | POA: Diagnosis not present

## 2016-09-14 DIAGNOSIS — H6121 Impacted cerumen, right ear: Secondary | ICD-10-CM | POA: Diagnosis not present

## 2016-09-14 DIAGNOSIS — K149 Disease of tongue, unspecified: Secondary | ICD-10-CM | POA: Diagnosis not present

## 2016-09-27 DIAGNOSIS — E1122 Type 2 diabetes mellitus with diabetic chronic kidney disease: Secondary | ICD-10-CM | POA: Diagnosis not present

## 2016-09-27 DIAGNOSIS — E559 Vitamin D deficiency, unspecified: Secondary | ICD-10-CM | POA: Diagnosis not present

## 2016-09-27 DIAGNOSIS — I129 Hypertensive chronic kidney disease with stage 1 through stage 4 chronic kidney disease, or unspecified chronic kidney disease: Secondary | ICD-10-CM | POA: Diagnosis not present

## 2016-09-27 DIAGNOSIS — N183 Chronic kidney disease, stage 3 (moderate): Secondary | ICD-10-CM | POA: Diagnosis not present

## 2016-09-29 DIAGNOSIS — M9080 Osteopathy in diseases classified elsewhere, unspecified site: Secondary | ICD-10-CM | POA: Diagnosis not present

## 2016-09-29 DIAGNOSIS — E889 Metabolic disorder, unspecified: Secondary | ICD-10-CM | POA: Diagnosis not present

## 2016-09-29 DIAGNOSIS — E1122 Type 2 diabetes mellitus with diabetic chronic kidney disease: Secondary | ICD-10-CM | POA: Diagnosis not present

## 2016-09-29 DIAGNOSIS — I129 Hypertensive chronic kidney disease with stage 1 through stage 4 chronic kidney disease, or unspecified chronic kidney disease: Secondary | ICD-10-CM | POA: Diagnosis not present

## 2016-09-29 DIAGNOSIS — E8779 Other fluid overload: Secondary | ICD-10-CM | POA: Diagnosis not present

## 2016-09-29 DIAGNOSIS — D631 Anemia in chronic kidney disease: Secondary | ICD-10-CM | POA: Diagnosis not present

## 2016-09-29 DIAGNOSIS — E559 Vitamin D deficiency, unspecified: Secondary | ICD-10-CM | POA: Diagnosis not present

## 2016-09-29 DIAGNOSIS — N183 Chronic kidney disease, stage 3 (moderate): Secondary | ICD-10-CM | POA: Diagnosis not present

## 2016-10-07 ENCOUNTER — Telehealth: Payer: Self-pay | Admitting: Emergency Medicine

## 2016-10-07 ENCOUNTER — Other Ambulatory Visit: Payer: Medicare Other

## 2016-10-07 ENCOUNTER — Ambulatory Visit (INDEPENDENT_AMBULATORY_CARE_PROVIDER_SITE_OTHER): Payer: Medicare Other | Admitting: Medical

## 2016-10-07 VITALS — BP 133/14 | HR 80 | Temp 98.4°F | Resp 14

## 2016-10-07 DIAGNOSIS — D729 Disorder of white blood cells, unspecified: Secondary | ICD-10-CM

## 2016-10-07 DIAGNOSIS — L089 Local infection of the skin and subcutaneous tissue, unspecified: Secondary | ICD-10-CM

## 2016-10-07 DIAGNOSIS — G629 Polyneuropathy, unspecified: Secondary | ICD-10-CM

## 2016-10-07 DIAGNOSIS — E108 Type 1 diabetes mellitus with unspecified complications: Secondary | ICD-10-CM

## 2016-10-07 DIAGNOSIS — D473 Essential (hemorrhagic) thrombocythemia: Secondary | ICD-10-CM | POA: Insufficient documentation

## 2016-10-07 DIAGNOSIS — D474 Osteomyelofibrosis: Secondary | ICD-10-CM | POA: Insufficient documentation

## 2016-10-07 LAB — CBC WITH DIFFERENTIAL/PLATELET
Basophils Absolute: 0.1 10*3/uL (ref 0.0–0.1)
Basophils Relative: 1 % (ref 0.0–3.0)
Eosinophils Absolute: 0.2 10*3/uL (ref 0.0–0.7)
Eosinophils Relative: 1.2 % (ref 0.0–5.0)
HCT: 30.5 % — ABNORMAL LOW (ref 36.0–46.0)
Hemoglobin: 9.8 g/dL — ABNORMAL LOW (ref 12.0–15.0)
Lymphocytes Relative: 7.9 % — ABNORMAL LOW (ref 12.0–46.0)
Lymphs Abs: 1 10*3/uL (ref 0.7–4.0)
MCHC: 32.3 g/dL (ref 30.0–36.0)
MCV: 84 fl (ref 78.0–100.0)
Monocytes Absolute: 1.2 10*3/uL — ABNORMAL HIGH (ref 0.1–1.0)
Monocytes Relative: 9.3 % (ref 3.0–12.0)
Neutro Abs: 10.5 10*3/uL — ABNORMAL HIGH (ref 1.4–7.7)
Neutrophils Relative %: 80.6 % — ABNORMAL HIGH (ref 43.0–77.0)
Platelets: 74 10*3/uL — ABNORMAL LOW (ref 150.0–400.0)
RBC: 3.63 Mil/uL — ABNORMAL LOW (ref 3.87–5.11)
RDW: 16.7 % — ABNORMAL HIGH (ref 11.5–15.5)
WBC: 13 10*3/uL — ABNORMAL HIGH (ref 4.0–10.5)

## 2016-10-07 MED ORDER — DOXYCYCLINE HYCLATE 100 MG PO CAPS: 100.0000 mg | ORAL_CAPSULE | Freq: Two times a day (BID) | ORAL | 0 refills | Status: DC

## 2016-10-07 MED ORDER — GABAPENTIN 100 MG PO CAPS: 100.0000 mg | ORAL_CAPSULE | Freq: Every day | ORAL | 0 refills | Status: DC

## 2016-10-07 MED ORDER — CEFTRIAXONE SODIUM 1 G IJ SOLR
1.0000 g | Freq: Once | INTRAMUSCULAR | Status: AC
  Administered 2016-10-07: 1 g via INTRAMUSCULAR

## 2016-10-07 NOTE — Patient Instructions (Signed)
For skin infection/cellulitis we gave rocephin 1 gram im and rx doxycycline.  For neuropathy will rx neurontin.  Keep checking blood sugars daily. I would titrate up on insulin by one unit each injection if sugars are over 200.  Follow up on Monday or as needed.  If area worsens over weekend then ED evaluation.

## 2016-10-07 NOTE — Progress Notes (Signed)
Subjective:    Patient ID: Erin [name], female    DOB: [birth year], 71 y.o.   MRN: [id number]  HPI  Pt in with one week of rt lower leg blister that eventually popped. Then scab formed sometime over past week. Some surrounding tenderness, some warmth around scab and faint swelling.  Also some tingling and burning of her feet.(both sides). This has been the case in the past. Known diabetic. Tingling keeps her up at night.  Pt in past had a1c of 12.4. 2 months ago. Pt is on novoluin 70-30 35 units am, 35 units at lunch and 40 at night. Sugar last night was over 300.   No fever, no chills or sweats.    Review of Systems  Constitutional: Negative for chills, fatigue and fever.  Respiratory: Negative for chest tightness, shortness of breath and wheezing.   Cardiovascular: Negative for chest pain and palpitations.  Gastrointestinal: Negative for abdominal distention, blood in stool and constipation.  Endocrine: Negative for polydipsia, polyphagia and polyuria.  Musculoskeletal: Negative for back pain.       See hpi.  Skin:       See hpi.  Neurological: Negative for dizziness, speech difficulty, weakness and headaches.       Neuropathy type pain.  Hematological: Negative for adenopathy. Does not bruise/bleed easily.  Psychiatric/Behavioral: Negative for behavioral problems and confusion.    Past Medical History:  Diagnosis Date  . Asthma   . CHF (congestive heart failure) [facility])    hospital 11/2013-- [place]  . Chronic kidney disease   . Depression   . Diabetes mellitus ([place])   . GERD (gastroesophageal reflux disease)   . Hyperlipidemia   . Hypertension      Social History   Social History  . Marital status: Widowed    Spouse name: N/A  . Number of children: 4  . Years of education: N/A   Occupational History  . 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 Smoking status: Former Smoker    Packs/day: 1.50    Years: 20.00    Types:  Cigarettes    Quit date: 08/02/1984  . Smokeless tobacco: Never Used  . Alcohol use No  . Drug use: No  . Sexual activity: Not Currently    Partners: Male   Other Topics Concern  . Not on file   Social History Narrative   NO REG EXERCISE   Caffeine use: daily    Past Surgical History:  Procedure Laterality Date  . CHOLECYSTECTOMY  2002  . SPINE SURGERY  aug 2015   [facility]  . TONSILLECTOMY    . TUBAL LIGATION      Family History  Problem Relation Age of Onset  . Hypertension Mother   . Alzheimer's disease Mother   . Hyperlipidemia Mother   . Heart disease Father        cad  . Asthma Father   . Cancer Father 44       leukemia  . Stroke Father   . Hypertension Father   . Heart disease Sister 8       MI  . Pulmonary fibrosis Sister   . Arthritis Brother   . Heart disease Maternal Uncle   . Stroke Maternal Uncle   . Uterine cancer Paternal Grandmother   . Stomach cancer Paternal Grandfather   . Heart disease Maternal Uncle   . Heart disease Maternal Uncle     No Known Allergies  Current Outpatient Prescriptions on File Prior to Visit  Medication Sig Dispense Refill  . albuterol (PROVENTIL) (2.5 MG/3ML) 0.083% nebulizer solution Take 3 mLs (2.5 mg total) by nebulization every 6 (six) hours as needed for wheezing or shortness of breath. 75 mL 12  . allopurinol (ZYLOPRIM) 100 MG tablet Take 1 tablet by mouth 2 (two) times daily.    Marland Kitchen atorvastatin (LIPITOR) 40 MG tablet TAKE ONE TABLET BY MOUTH ONCE DAILY 90 tablet 1  . busPIRone (BUSPAR) 15 MG tablet Take 1 tablet (15 mg total) by mouth 3 (three) times daily. 90 tablet 1  . carvedilol (COREG) 6.25 MG tablet Take 1 tablet (6.25 mg total) by mouth every 12 (twelve) hours. Please schedule appointment for refills. 180 tablet 0  . ergocalciferol (VITAMIN D2) 50000 UNITS capsule Take 50,000 Units by mouth once a week.    . Ferrous Sulfate (IRON) 325 (65 FE) MG TABS Take 1 tablet by mouth 2 (two) times daily.     . Fluticasone Furoate-Vilanterol (BREO ELLIPTA) 200-25 MCG/INH AEPB Inhale 1 puff into the lungs daily.    . Fluticasone-Salmeterol (ADVAIR) 250-50 MCG/DOSE AEPB Inhale 1 puff into the lungs 2 (two) times daily.    Marland Kitchen glipiZIDE (GLUCOTROL) 10 MG tablet Take 1 tablet (10 mg total) by mouth 2 (two) times daily before a meal. 180 tablet 3  . insulin NPH-regular Human (NOVOLIN 70/30 RELION) (70-30) 100 UNIT/ML injection Inject under skin 15-30 min before b'fast and supper: 35-40 units 3x a day. ReliOn. 30 mL 5  . Insulin Syringe-Needle U-100 (B-D INS SYR ULTRAFINE .5CC/30G) 30G X 1/2" 0.5 ML MISC Use 3x a day 300 each 3  . metolazone (ZAROXOLYN) 5 MG tablet Take 1 tablet weekly 30 minutes prior to furosemide dose    . pantoprazole (PROTONIX) 40 MG tablet TAKE ONE TABLET BY MOUTH ONCE DAILY 90 tablet 1  . potassium chloride (K-DUR,KLOR-CON) 10 MEQ tablet Take by mouth.    . pramipexole (MIRAPEX) 1 MG tablet TAKE ONE TABLET BY MOUTH AT BEDTIME 30 tablet 5  . sertraline (ZOLOFT) 50 MG tablet Take 1.5 tablets (75 mg total) by mouth daily. 135 tablet 3  . sulfamethoxazole-trimethoprim (BACTRIM DS,SEPTRA DS) 800-160 MG tablet Take 1 tablet by mouth daily.     Marland Kitchen torsemide (DEMADEX) 20 MG tablet Take 80 mg by mouth.     No current facility-administered medications on file prior to visit.     BP (!) 133/14 (BP Location: Left Arm, Patient Position: Sitting, Cuff Size: Large)   Pulse 80   Temp 98.4 F (36.9 C) (Oral)   Resp 14   SpO2 95%       Objective:   Physical Exam  General- No acute distress. Pleasant patient. Neck- Full range of motion, no jvd Lungs- Clear, even and unlabored. Heart- regular rate and rhythm. Neurologic- CNII- XII grossly intact.  Rt lower ext- pretibial area lower aspect 10 cm x 8 cm mid red, warm and tender area with 1.5 cm area scab present.       Assessment & Plan:  For skin infection/cellulitis we gave rocephin 1 gram im and rx doxycycline.  For neuropathy will  rx neurontin.  Keep checking blood sugars daily. I would titrate up on insulin by one unit each injection if sugars are over 200.  Follow up on Monday or as needed.  If area worsens over weekend then ED evaluation.  Yuriana Gaal, [name], PA-C

## 2016-10-07 NOTE — Telephone Encounter (Signed)
Patient was seen today @1000  CBCd was ordered, Santiago Glad at [facility] said she had abnormal cells and bllod was sent to [facility] for Path Review. KMP

## 2016-10-08 DIAGNOSIS — D631 Anemia in chronic kidney disease: Secondary | ICD-10-CM | POA: Diagnosis not present

## 2016-10-08 DIAGNOSIS — N183 Chronic kidney disease, stage 3 (moderate): Secondary | ICD-10-CM | POA: Diagnosis not present

## 2016-10-08 DIAGNOSIS — I129 Hypertensive chronic kidney disease with stage 1 through stage 4 chronic kidney disease, or unspecified chronic kidney disease: Secondary | ICD-10-CM | POA: Diagnosis not present

## 2016-10-08 LAB — PATHOLOGIST SMEAR REVIEW

## 2016-10-10 DIAGNOSIS — N183 Chronic kidney disease, stage 3 (moderate): Secondary | ICD-10-CM | POA: Diagnosis not present

## 2016-10-10 DIAGNOSIS — D631 Anemia in chronic kidney disease: Secondary | ICD-10-CM | POA: Diagnosis not present

## 2016-10-10 DIAGNOSIS — L97911 Non-pressure chronic ulcer of unspecified part of right lower leg limited to breakdown of skin: Secondary | ICD-10-CM | POA: Diagnosis not present

## 2016-10-11 ENCOUNTER — Telehealth: Payer: Self-pay | Admitting: Medical

## 2016-10-11 ENCOUNTER — Ambulatory Visit (INDEPENDENT_AMBULATORY_CARE_PROVIDER_SITE_OTHER): Payer: Medicare Other | Admitting: Medical

## 2016-10-11 VITALS — BP 116/39 | HR 84 | Temp 98.3°F | Resp 14

## 2016-10-11 DIAGNOSIS — L089 Local infection of the skin and subcutaneous tissue, unspecified: Secondary | ICD-10-CM

## 2016-10-11 DIAGNOSIS — D72829 Elevated white blood cell count, unspecified: Secondary | ICD-10-CM

## 2016-10-11 DIAGNOSIS — L0390 Cellulitis, unspecified: Secondary | ICD-10-CM

## 2016-10-11 DIAGNOSIS — M79604 Pain in right leg: Secondary | ICD-10-CM

## 2016-10-11 DIAGNOSIS — T148XXD Other injury of unspecified body region, subsequent encounter: Secondary | ICD-10-CM

## 2016-10-11 LAB — CBC WITH DIFFERENTIAL/PLATELET
Basophils Absolute: 0.1 10*3/uL (ref 0.0–0.1)
Basophils Relative: 0.9 % (ref 0.0–3.0)
Eosinophils Absolute: 0.2 10*3/uL (ref 0.0–0.7)
Eosinophils Relative: 1.4 % (ref 0.0–5.0)
HCT: 30.7 % — ABNORMAL LOW (ref 36.0–46.0)
Hemoglobin: 10 g/dL — ABNORMAL LOW (ref 12.0–15.0)
Lymphocytes Relative: 9 % — ABNORMAL LOW (ref 12.0–46.0)
Lymphs Abs: 1.3 10*3/uL (ref 0.7–4.0)
MCHC: 32.5 g/dL (ref 30.0–36.0)
MCV: 84.2 fl (ref 78.0–100.0)
Monocytes Absolute: 1.1 10*3/uL — ABNORMAL HIGH (ref 0.1–1.0)
Monocytes Relative: 7.7 % (ref 3.0–12.0)
Neutro Abs: 11.4 10*3/uL — ABNORMAL HIGH (ref 1.4–7.7)
Neutrophils Relative %: 81 % — ABNORMAL HIGH (ref 43.0–77.0)
Platelets: 74 10*3/uL — ABNORMAL LOW (ref 150.0–400.0)
RBC: 3.65 Mil/uL — ABNORMAL LOW (ref 3.87–5.11)
RDW: 16.8 % — ABNORMAL HIGH (ref 11.5–15.5)
WBC: 14 10*3/uL — ABNORMAL HIGH (ref 4.0–10.5)

## 2016-10-11 NOTE — Patient Instructions (Signed)
Your area of skin infection looks improved. But not completely and scab does have area to culture today.  Will follow the culture.  Continue the doxycycline antibiotic.   Follow up in one week and if area not improved will refer to wound care.  Please keep checking your sugars and be compliant on insulin use and low sugar diet.

## 2016-10-11 NOTE — Telephone Encounter (Signed)
Opened to review 

## 2016-10-11 NOTE — Progress Notes (Addendum)
Subjective:    Patient ID: Erin [name], female    DOB: 27-Feb-1946, 71 y.o.   MRN: [id number]  HPI  Pt in for follow up for her rt lower leg infection. On last visit I gave rocephin 1 gram and rx doxycycline. Pt has not had any fevers, no chills or sweats. Rt leg has less pain. No dc last time but now serosanguineous dc from scab.  Pt has increased insulin by one unit each meal. She states she can't remember what recent readings have been.  Pt has received iron infusion other day by kidney doctor.   Pt wbc elevated the other day. Mild anemia. Low platelets.  Absolute monocytosis and Absolute neutrophilia with mild left shift in  maturation. Favor reactive process. Clinical correlation is  recommended. Normocytic/Normochromic anemia with unremarkable RBC  morphology. Thrombocytopenia. Platelets are unremarkable. No platelet  clumps identified. Favor reactive process. Clinical correlation is  recommended.     Review of Systems  Constitutional: Negative for chills, fatigue and fever.  Respiratory: Negative for cough, chest tightness, shortness of breath and wheezing.   Cardiovascular: Negative for chest pain and palpitations.  Gastrointestinal: Negative for abdominal pain.  Endocrine: Negative for polydipsia, polyphagia and polyuria.  Genitourinary: Negative for dyspareunia, dysuria and frequency.  Skin:       Rt lower ext- infection. See hpi.  Neurological: Negative for dizziness and headaches.  Hematological: Negative for adenopathy. Does not bruise/bleed easily.  Psychiatric/Behavioral: Negative for behavioral problems and confusion. The patient is not nervous/anxious.    Past Medical History:  Diagnosis Date  . Asthma   . CHF (congestive heart failure) [facility])    [place] 11/2013-- [place]  . Chronic kidney disease   . Depression   . Diabetes mellitus ([place])   . GERD (gastroesophageal reflux disease)   . Hyperlipidemia   . Hypertension      Social History    Social History  . Marital status: Widowed    Spouse name: N/A  . Number of children: 4  . Years of education: N/A   Occupational History  . 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 Smoking status: Former Smoker    Packs/day: 1.50    Years: 20.00    Types: Cigarettes    Quit date: 08/02/1984  . Smokeless tobacco: Never Used  . Alcohol use No  . Drug use: No  . Sexual activity: Not Currently    Partners: Male   Other Topics Concern  . Not on file   Social History Narrative   NO REG EXERCISE   Caffeine use: daily    Past Surgical History:  Procedure Laterality Date  . CHOLECYSTECTOMY  2002  . SPINE SURGERY  aug 2015   [facility]  . TONSILLECTOMY    . TUBAL LIGATION      Family History  Problem Relation Age of Onset  . Hypertension Mother   . Alzheimer's disease Mother   . Hyperlipidemia Mother   . Heart disease Father        cad  . Asthma Father   . Cancer Father 38       leukemia  . Stroke Father   . Hypertension Father   . Heart disease Sister 39       MI  . Pulmonary fibrosis Sister   . Arthritis Brother   . Heart disease Maternal Uncle   . Stroke Maternal Uncle   . Uterine cancer Paternal Grandmother   .  Stomach cancer Paternal Grandfather   . Heart disease Maternal Uncle   . Heart disease Maternal Uncle     No Known Allergies  Current Outpatient Prescriptions on File Prior to Visit  Medication Sig Dispense Refill  . albuterol (PROVENTIL) (2.5 MG/3ML) 0.083% nebulizer solution Take 3 mLs (2.5 mg total) by nebulization every 6 (six) hours as needed for wheezing or shortness of breath. 75 mL 12  . allopurinol (ZYLOPRIM) 100 MG tablet Take 1 tablet by mouth 2 (two) times daily.    Marland Kitchen atorvastatin (LIPITOR) 40 MG tablet TAKE ONE TABLET BY MOUTH ONCE DAILY 90 tablet 1  . busPIRone (BUSPAR) 15 MG tablet Take 1 tablet (15 mg total) by mouth 3 (three) times daily. 90 tablet 1  . carvedilol (COREG) 6.25 MG tablet Take 1  tablet (6.25 mg total) by mouth every 12 (twelve) hours. Please schedule appointment for refills. 180 tablet 0  . doxycycline (VIBRAMYCIN) 100 MG capsule Take 1 capsule (100 mg total) by mouth 2 (two) times daily. Can give caps or generic 20 capsule 0  . ergocalciferol (VITAMIN D2) 50000 UNITS capsule Take 50,000 Units by mouth once a week.    . Ferrous Sulfate (IRON) 325 (65 FE) MG TABS Take 1 tablet by mouth 2 (two) times daily.    . Fluticasone Furoate-Vilanterol (BREO ELLIPTA) 200-25 MCG/INH AEPB Inhale 1 puff into the lungs daily.    . Fluticasone-Salmeterol (ADVAIR) 250-50 MCG/DOSE AEPB Inhale 1 puff into the lungs 2 (two) times daily.    Marland Kitchen gabapentin (NEURONTIN) 100 MG capsule Take 1 capsule (100 mg total) by mouth at bedtime. 30 capsule 0  . glipiZIDE (GLUCOTROL) 10 MG tablet Take 1 tablet (10 mg total) by mouth 2 (two) times daily before a meal. 180 tablet 3  . insulin NPH-regular Human (NOVOLIN 70/30 RELION) (70-30) 100 UNIT/ML injection Inject under skin 15-30 min before b'fast and supper: 35-40 units 3x a day. ReliOn. 30 mL 5  . Insulin Syringe-Needle U-100 (B-D INS SYR ULTRAFINE .5CC/30G) 30G X 1/2" 0.5 ML MISC Use 3x a day 300 each 3  . metolazone (ZAROXOLYN) 5 MG tablet Take 1 tablet weekly 30 minutes prior to furosemide dose    . pantoprazole (PROTONIX) 40 MG tablet TAKE ONE TABLET BY MOUTH ONCE DAILY 90 tablet 1  . potassium chloride (K-DUR,KLOR-CON) 10 MEQ tablet Take by mouth.    . pramipexole (MIRAPEX) 1 MG tablet TAKE ONE TABLET BY MOUTH AT BEDTIME 30 tablet 5  . sertraline (ZOLOFT) 50 MG tablet Take 1.5 tablets (75 mg total) by mouth daily. 135 tablet 3  . sulfamethoxazole-trimethoprim (BACTRIM DS,SEPTRA DS) 800-160 MG tablet Take 1 tablet by mouth daily.     Marland Kitchen torsemide (DEMADEX) 20 MG tablet Take 80 mg by mouth.     No current facility-administered medications on file prior to visit.     BP (!) 116/39 (BP Location: Right Arm, Patient Position: Sitting, Cuff Size: Large)    Pulse 84   Temp 98.3 F (36.8 C) (Oral)   Resp 14   SpO2 94%       Objective:   Physical Exam  General- No acute distress. Pleasant patient. Neck- Full range of motion, no jvd Lungs- Clear, even and unlabored. Heart- regular rate and rhythm. Neurologic- CNII- XII grossly intact.   Rt lower ext- pretibial area lower aspect prior margins of redness dissapreared. Less swollen, less warm and less tender. But scab region looks same. No serosanguinous dc from edge of scab  and small yellow  dc from center of the scab.        Assessment & Plan:  Your area of skin infection looks improved. But not completely and scab does have area to culture today.  Will follow the culture.  Continue the doxycycline antibiotic.   Follow up in one week and if area not improved will refer to wound care.  Please keep checking your sugars and be compliant on insulin use and low sugar diet.  Sayer Masini, [name], PA-C

## 2016-10-11 NOTE — Telephone Encounter (Signed)
Wound referral placed. Please try to expidite.

## 2016-10-11 NOTE — Telephone Encounter (Signed)
Xray of tibia place. She can get doe on Tuesday.

## 2016-10-12 ENCOUNTER — Ambulatory Visit (HOSPITAL_BASED_OUTPATIENT_CLINIC_OR_DEPARTMENT_OTHER)
Admission: RE | Admit: 2016-10-12 | Discharge: 2016-10-12 | Disposition: A | Discharge status: Home / Self Care | Payer: Medicare Other | Source: Ambulatory Visit | Attending: Medical | Admitting: Medical

## 2016-10-12 DIAGNOSIS — M1711 Unilateral primary osteoarthritis, right knee: Secondary | ICD-10-CM | POA: Diagnosis not present

## 2016-10-12 DIAGNOSIS — X58XXXD Exposure to other specified factors, subsequent encounter: Secondary | ICD-10-CM | POA: Diagnosis not present

## 2016-10-12 DIAGNOSIS — T148XXD Other injury of unspecified body region, subsequent encounter: Secondary | ICD-10-CM | POA: Diagnosis not present

## 2016-10-12 DIAGNOSIS — M79604 Pain in right leg: Secondary | ICD-10-CM | POA: Diagnosis not present

## 2016-10-12 DIAGNOSIS — I129 Hypertensive chronic kidney disease with stage 1 through stage 4 chronic kidney disease, or unspecified chronic kidney disease: Secondary | ICD-10-CM | POA: Diagnosis not present

## 2016-10-12 DIAGNOSIS — L0390 Cellulitis, unspecified: Secondary | ICD-10-CM | POA: Insufficient documentation

## 2016-10-12 DIAGNOSIS — M7989 Other specified soft tissue disorders: Secondary | ICD-10-CM | POA: Diagnosis not present

## 2016-10-12 DIAGNOSIS — D631 Anemia in chronic kidney disease: Secondary | ICD-10-CM | POA: Diagnosis not present

## 2016-10-12 DIAGNOSIS — N184 Chronic kidney disease, stage 4 (severe): Secondary | ICD-10-CM | POA: Diagnosis not present

## 2016-10-12 NOTE — Telephone Encounter (Signed)
Notified pt.Pt states she will come in tomorrow to get xray done.

## 2016-10-12 NOTE — Telephone Encounter (Signed)
Per Erin [name]'s note in chart: Spoke to [place] at [place] [facility] and first available in almost 2 weeks. Advised me to call [place] office. I talked to Erin [name] at [facility] in [place] wound care center and then spoke with pt. Pt ok with appt in [place] and scheduled for 10/20/16 at 10:30am.

## 2016-10-13 ENCOUNTER — Other Ambulatory Visit: Payer: Self-pay

## 2016-10-14 ENCOUNTER — Ambulatory Visit (INDEPENDENT_AMBULATORY_CARE_PROVIDER_SITE_OTHER): Payer: Medicare Other | Admitting: Medical

## 2016-10-14 ENCOUNTER — Telehealth: Payer: Self-pay | Admitting: Medical

## 2016-10-14 ENCOUNTER — Encounter: Payer: Self-pay | Admitting: Medical

## 2016-10-14 VITALS — BP 157/58 | HR 84 | Temp 97.7°F | Resp 14

## 2016-10-14 DIAGNOSIS — I809 Phlebitis and thrombophlebitis of unspecified site: Secondary | ICD-10-CM

## 2016-10-14 DIAGNOSIS — L0390 Cellulitis, unspecified: Secondary | ICD-10-CM | POA: Diagnosis not present

## 2016-10-14 DIAGNOSIS — D696 Thrombocytopenia, unspecified: Secondary | ICD-10-CM

## 2016-10-14 DIAGNOSIS — E108 Type 1 diabetes mellitus with unspecified complications: Secondary | ICD-10-CM | POA: Diagnosis not present

## 2016-10-14 LAB — CBC WITH DIFFERENTIAL/PLATELET
Basophils Absolute: 0.2 10*3/uL — ABNORMAL HIGH (ref 0.0–0.1)
Basophils Relative: 1.1 % (ref 0.0–3.0)
Eosinophils Absolute: 0.2 10*3/uL (ref 0.0–0.7)
Eosinophils Relative: 1.2 % (ref 0.0–5.0)
HCT: 32 % — ABNORMAL LOW (ref 36.0–46.0)
Hemoglobin: 10.4 g/dL — ABNORMAL LOW (ref 12.0–15.0)
Lymphocytes Relative: 8.6 % — ABNORMAL LOW (ref 12.0–46.0)
Lymphs Abs: 1.4 10*3/uL (ref 0.7–4.0)
MCHC: 32.6 g/dL (ref 30.0–36.0)
MCV: 85.2 fl (ref 78.0–100.0)
Monocytes Absolute: 1.3 10*3/uL — ABNORMAL HIGH (ref 0.1–1.0)
Monocytes Relative: 8.1 % (ref 3.0–12.0)
Neutro Abs: 13.1 10*3/uL — ABNORMAL HIGH (ref 1.4–7.7)
Neutrophils Relative %: 81 % — ABNORMAL HIGH (ref 43.0–77.0)
Platelets: 72 10*3/uL — ABNORMAL LOW (ref 150.0–400.0)
RBC: 3.75 Mil/uL — ABNORMAL LOW (ref 3.87–5.11)
RDW: 17.1 % — ABNORMAL HIGH (ref 11.5–15.5)
WBC: 16.2 10*3/uL — ABNORMAL HIGH (ref 4.0–10.5)

## 2016-10-14 LAB — WOUND CULTURE
Gram Stain: NONE SEEN
Gram Stain: NONE SEEN

## 2016-10-14 MED ORDER — SULFAMETHOXAZOLE-TRIMETHOPRIM 800-160 MG PO TABS: 1.0000 | ORAL_TABLET | Freq: Two times a day (BID) | ORAL | 0 refills | Status: DC

## 2016-10-14 MED ORDER — CEFTRIAXONE SODIUM 1 G IJ SOLR
1.0000 g | Freq: Once | INTRAMUSCULAR | Status: AC
  Administered 2016-10-14: 1 g via INTRAMUSCULAR

## 2016-10-14 MED ORDER — DOXYCYCLINE HYCLATE 100 MG PO CAPS: 100.0000 mg | ORAL_CAPSULE | Freq: Two times a day (BID) | ORAL | 0 refills | Status: DC

## 2016-10-14 NOTE — Telephone Encounter (Signed)
Referral to hematologist made.

## 2016-10-14 NOTE — Telephone Encounter (Signed)
Rx antibitoic sent to the pharmacy

## 2016-10-14 NOTE — Progress Notes (Signed)
Subjective:    Patient ID: Erin [name], female    DOB: 1945/08/18, 71 y.o.   MRN: [id number]  HPI   Pt in for a follow up.  I had decided to refer her to wound management for non healing rt mid pretibial wound due to elevated white count and diabetic status.  Pt is on doxy and her wound culture finally came back positive for mrsa. Sensitive to tetracycline but still not getting better.  Since last visit mild increase in redness and warmth to pretibial area. Xray of the lower ext showed not osteomyelitis.  Pt does see endocrinologist. But I don't see recent appointment. Pt has been increasing her insulin as I recommended. Now on insulin 36 units in am. Then 40 units afternoon and at night. She thinks yesterday sugar was 270 when she checked.    Pt had iron infusion. Just above that area mil red indurated region. No swelling of left upper arm/bicep.    Review of Systems  Constitutional: Negative for chills, fatigue and fever.  Respiratory: Negative for cough, chest tightness, shortness of breath and wheezing.   Cardiovascular: Negative for chest pain and palpitations.  Gastrointestinal: Negative for abdominal pain.  Musculoskeletal:       See hpi  Skin:       See hpi.  Neurological: Negative for dizziness, numbness and headaches.  Hematological: Negative for adenopathy. Does not bruise/bleed easily.  Psychiatric/Behavioral: Negative for behavioral problems and confusion.   Past Medical History:  Diagnosis Date  . Asthma   . CHF (congestive heart failure) [facility])    [place] 11/2013[place]  . Chronic kidney disease   . Depression   . Diabetes mellitus ([place])   . GERD (gastroesophageal reflux disease)   . Hyperlipidemia   . Hypertension      Social History   Social History  . Marital status: Widowed    Spouse name: N/A  . Number of children: 4  . Years of education: N/A   Occupational History  . 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 Smoking status: Former Smoker    Packs/day: 1.50    Years: 20.00    Types: Cigarettes    Quit date: 08/02/1984  . Smokeless tobacco: Never Used  . Alcohol use No  . Drug use: No  . Sexual activity: Not Currently    Partners: Male   Other Topics Concern  . Not on file   Social History Narrative   NO REG EXERCISE   Caffeine use: daily    Past Surgical History:  Procedure Laterality Date  . CHOLECYSTECTOMY  2002  . SPINE SURGERY  aug 2015   [facility]  . TONSILLECTOMY    . TUBAL LIGATION      Family History  Problem Relation Age of Onset  . Hypertension Mother   . Alzheimer's disease Mother   . Hyperlipidemia Mother   . Heart disease Father        cad  . Asthma Father   . Cancer Father 20       leukemia  . Stroke Father   . Hypertension Father   . Heart disease Sister 93       MI  . Pulmonary fibrosis Sister   . Arthritis Brother   . Heart disease Maternal Uncle   . Stroke Maternal Uncle   . Uterine cancer Paternal Grandmother   . Stomach cancer Paternal Grandfather   . Heart disease Maternal Uncle   .  Heart disease Maternal Uncle     No Known Allergies  Current Outpatient Prescriptions on File Prior to Visit  Medication Sig Dispense Refill  . albuterol (PROVENTIL) (2.5 MG/3ML) 0.083% nebulizer solution Take 3 mLs (2.5 mg total) by nebulization every 6 (six) hours as needed for wheezing or shortness of breath. 75 mL 12  . allopurinol (ZYLOPRIM) 100 MG tablet Take 1 tablet by mouth 2 (two) times daily.    Marland Kitchen atorvastatin (LIPITOR) 40 MG tablet TAKE ONE TABLET BY MOUTH ONCE DAILY 90 tablet 1  . busPIRone (BUSPAR) 15 MG tablet Take 1 tablet (15 mg total) by mouth 3 (three) times daily. 90 tablet 1  . carvedilol (COREG) 6.25 MG tablet Take 1 tablet (6.25 mg total) by mouth every 12 (twelve) hours. Please schedule appointment for refills. 180 tablet 0  . doxycycline (VIBRAMYCIN) 100 MG capsule Take 1 capsule (100 mg total) by mouth 2 (two) times  daily. Can give caps or generic 20 capsule 0  . ergocalciferol (VITAMIN D2) 50000 UNITS capsule Take 50,000 Units by mouth once a week.    . Ferrous Sulfate (IRON) 325 (65 FE) MG TABS Take 1 tablet by mouth 2 (two) times daily.    . Fluticasone Furoate-Vilanterol (BREO ELLIPTA) 200-25 MCG/INH AEPB Inhale 1 puff into the lungs daily.    . Fluticasone-Salmeterol (ADVAIR) 250-50 MCG/DOSE AEPB Inhale 1 puff into the lungs 2 (two) times daily.    Marland Kitchen gabapentin (NEURONTIN) 100 MG capsule Take 1 capsule (100 mg total) by mouth at bedtime. 30 capsule 0  . glipiZIDE (GLUCOTROL) 10 MG tablet Take 1 tablet (10 mg total) by mouth 2 (two) times daily before a meal. 180 tablet 3  . insulin NPH-regular Human (NOVOLIN 70/30 RELION) (70-30) 100 UNIT/ML injection Inject under skin 15-30 min before b'fast and supper: 35-40 units 3x a day. ReliOn. 30 mL 5  . Insulin Syringe-Needle U-100 (B-D INS SYR ULTRAFINE .5CC/30G) 30G X 1/2" 0.5 ML MISC Use 3x a day 300 each 3  . metolazone (ZAROXOLYN) 5 MG tablet Take 1 tablet weekly 30 minutes prior to furosemide dose    . pantoprazole (PROTONIX) 40 MG tablet TAKE ONE TABLET BY MOUTH ONCE DAILY 90 tablet 1  . potassium chloride (K-DUR,KLOR-CON) 10 MEQ tablet Take by mouth.    . pramipexole (MIRAPEX) 1 MG tablet TAKE ONE TABLET BY MOUTH AT BEDTIME 30 tablet 5  . sertraline (ZOLOFT) 50 MG tablet Take 1.5 tablets (75 mg total) by mouth daily. 135 tablet 3  . sulfamethoxazole-trimethoprim (BACTRIM DS,SEPTRA DS) 800-160 MG tablet Take 1 tablet by mouth daily.     Marland Kitchen torsemide (DEMADEX) 20 MG tablet Take 80 mg by mouth.    . torsemide (DEMADEX) 20 MG tablet TAKE FOUR TABLETS BY MOUTH TWICE DAILY     No current facility-administered medications on file prior to visit.     BP (!) 157/58   Pulse 84   Temp 97.7 F (36.5 C) (Oral)   Resp 14   SpO2 96%       Objective:   Physical Exam  General- No acute distress. Pleasant patient. Neck- Full range of motion, no jvd Lungs-  Clear, even and unlabored. Heart- regular rate and rhythm. Neurologic- CNII- XII grossly intact.   Rt lower ext- pretibial area lower aspect prior margins of redness increasd today still swollen, increased  warmth and less tender. But scab region looks same. some serosanguinous dc from edge of scab. No yellow dc today. Negative homans sign.  Left upper  ext- just above antecubial area mild 1 cm red area. Faint warmth, pink an mild indurated. No swollen vein seen. No bruit heard brachial area.      Assessment & Plan:  For your persisting skin infection/cellulitis continue doxycycline antibiotic.  We gave you rocpehin today as well.  Your slight red area above iron infusion site may be early infection vs superficial phlebitis. Apply warm compresses to area twice daily. If area get more red or expands up arm let us know immediatley. In that event would need lt upper ext ultrasound.  For diabetes will refer you back to specialist if appointment.(if no upcoming appointment).  For low platlets and  High wbc repeat cbc today. If platelets still low will inform hematologist in [place] who has seen you before.  Follow up on Wednesday with wound care but keep appointment with me on Monday if needed  Aubriana Ravelo, [place], [place]

## 2016-10-14 NOTE — Patient Instructions (Addendum)
For your persisting skin infection/cellulitis continue doxycycline antibiotic.  We gave you rocpehin today as well.  Your slight red area above iron infusion site may be early infection vs superficial phlebitis. Apply warm compresses to area twice daily. If area get more red or expands up arm let us know immediatley. In that event would need lt upper ext ultrasound.  For diabetes will refer you back to specialist if appointment.(if no upcoming appointment).  For low platlets and  High wbc repeat cbc today. If platelets still low will inform hematologist in [place] who has seen you before.  Follow up on Wednesday with wound care but keep appointment with me on Monday if needed

## 2016-10-15 ENCOUNTER — Telehealth: Payer: Self-pay | Admitting: Family Medicine

## 2016-10-15 NOTE — Telephone Encounter (Signed)
Patient calling to get lab results.

## 2016-10-18 ENCOUNTER — Ambulatory Visit (INDEPENDENT_AMBULATORY_CARE_PROVIDER_SITE_OTHER): Payer: Medicare Other | Admitting: Medical

## 2016-10-18 ENCOUNTER — Encounter: Payer: Self-pay | Admitting: Medical

## 2016-10-18 VITALS — BP 130/60 | HR 70 | Temp 98.2°F | Resp 14

## 2016-10-18 DIAGNOSIS — R05 Cough: Secondary | ICD-10-CM

## 2016-10-18 DIAGNOSIS — D696 Thrombocytopenia, unspecified: Secondary | ICD-10-CM

## 2016-10-18 DIAGNOSIS — D72829 Elevated white blood cell count, unspecified: Secondary | ICD-10-CM | POA: Diagnosis not present

## 2016-10-18 DIAGNOSIS — E785 Hyperlipidemia, unspecified: Secondary | ICD-10-CM

## 2016-10-18 DIAGNOSIS — L0390 Cellulitis, unspecified: Secondary | ICD-10-CM

## 2016-10-18 DIAGNOSIS — R059 Cough, unspecified: Secondary | ICD-10-CM

## 2016-10-18 DIAGNOSIS — Z22322 Carrier or suspected carrier of Methicillin resistant Staphylococcus aureus: Secondary | ICD-10-CM

## 2016-10-18 DIAGNOSIS — E108 Type 1 diabetes mellitus with unspecified complications: Secondary | ICD-10-CM | POA: Diagnosis not present

## 2016-10-18 LAB — COMPREHENSIVE METABOLIC PANEL
ALT: 19 U/L (ref 0–35)
AST: 25 U/L (ref 0–37)
Albumin: 3.5 g/dL (ref 3.5–5.2)
Alkaline Phosphatase: 79 U/L (ref 39–117)
BUN: 48 mg/dL — ABNORMAL HIGH (ref 6–23)
CO2: 30 mEq/L (ref 19–32)
Calcium: 9.3 mg/dL (ref 8.4–10.5)
Chloride: 100 mEq/L (ref 96–112)
Creatinine, Ser: 1.72 mg/dL — ABNORMAL HIGH (ref 0.40–1.20)
GFR: 31.02 mL/min — ABNORMAL LOW (ref >60.00)
Glucose, Bld: 147 mg/dL — ABNORMAL HIGH (ref 70–99)
Potassium: 3.6 mEq/L (ref 3.5–5.1)
Sodium: 139 mEq/L (ref 135–145)
Total Bilirubin: 0.5 mg/dL (ref 0.2–1.2)
Total Protein: 7.2 g/dL (ref 6.0–8.3)

## 2016-10-18 LAB — LIPID PANEL
Cholesterol: 102 mg/dL (ref 0–200)
HDL: 31.7 mg/dL — ABNORMAL LOW (ref >39.00)
LDL Cholesterol: 33 mg/dL (ref 0–99)
NonHDL: 69.95
Total CHOL/HDL Ratio: 3
Triglycerides: 184 mg/dL — ABNORMAL HIGH (ref 0.0–149.0)
VLDL: 36.8 mg/dL (ref 0.0–40.0)

## 2016-10-18 MED ORDER — CEFTRIAXONE SODIUM 1 G IJ SOLR
1.0000 g | Freq: Once | INTRAMUSCULAR | Status: AC
  Administered 2016-10-18: 1 g via INTRAMUSCULAR

## 2016-10-18 NOTE — Progress Notes (Signed)
Subjective:    Patient ID: Erin [name], female    DOB: [birth year], 71 y.o.   MRN: [id number]  HPI  Pt in for follow up.   On summary pt had rt lower extremity wound pretibial area for weeks now. She has had delayed healing with high wbc and low platelets. No fevers, no chills or sweats on last visits. Pt left leg now also showing some redness swelling and tenderness as well   Pt does have wound care clinic appointment this Wednesday.   Pt is still taking doxycycline. Bactrim DS ok'd by her nephrologist but it made her vomit.  Pt states her sugars have been better controlled recently. Yesterday had 119 in am fating and later 190.    Review of Systems  Constitutional: Positive for chills. Negative for fatigue and fever.       Pt though on weekend one had intermittent chills.  HENT: Negative for congestion, drooling, mouth sores, sinus pain, sinus pressure, sneezing and sore throat.   Respiratory: Positive for cough. Negative for chest tightness, shortness of breath and wheezing.        On and off productive cough for about one week. Recent some dark yellow mucus.  She declined xray that I offered/recommended today.  Cardiovascular: Negative for chest pain and palpitations.  Musculoskeletal: Negative for back pain.  Skin: Negative for pallor and rash.  Neurological: Negative for dizziness, seizures, syncope, weakness and headaches.  Hematological: Negative for adenopathy. Does not bruise/bleed easily.  Psychiatric/Behavioral: Negative for agitation and confusion.    Past Medical History:  Diagnosis Date  . Asthma   . CHF (congestive heart failure) [facility])    [place] 11/2013[place]  . Chronic kidney disease   . Depression   . Diabetes mellitus ([place])   . GERD (gastroesophageal reflux disease)   . Hyperlipidemia   . Hypertension      Social History   Social History  . Marital status: Widowed    Spouse name: N/A  . Number of children: 4  . Years of education:  N/A   Occupational History  . 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 Smoking status: Former Smoker    Packs/day: 1.50    Years: 20.00    Types: Cigarettes    Quit date: 08/02/1984  . Smokeless tobacco: Never Used  . Alcohol use No  . Drug use: No  . Sexual activity: Not Currently    Partners: Male   Other Topics Concern  . Not on file   Social History Narrative   NO REG EXERCISE   Caffeine use: daily    Past Surgical History:  Procedure Laterality Date  . CHOLECYSTECTOMY  2002  . SPINE SURGERY  aug 2015   [facility]  . TONSILLECTOMY    . TUBAL LIGATION      Family History  Problem Relation Age of Onset  . Hypertension Mother   . Alzheimer's disease Mother   . Hyperlipidemia Mother   . Heart disease Father        cad  . Asthma Father   . Cancer Father 92       leukemia  . Stroke Father   . Hypertension Father   . Heart disease Sister 75       MI  . Pulmonary fibrosis Sister   . Arthritis Brother   . Heart disease Maternal Uncle   . Stroke Maternal Uncle   . Uterine cancer Paternal Grandmother   .  Stomach cancer Paternal Grandfather   . Heart disease Maternal Uncle   . Heart disease Maternal Uncle     No Known Allergies  Current Outpatient Prescriptions on File Prior to Visit  Medication Sig Dispense Refill  . albuterol (PROVENTIL) (2.5 MG/3ML) 0.083% nebulizer solution Take 3 mLs (2.5 mg total) by nebulization every 6 (six) hours as needed for wheezing or shortness of breath. 75 mL 12  . allopurinol (ZYLOPRIM) 100 MG tablet Take 1 tablet by mouth 2 (two) times daily.    Marland Kitchen atorvastatin (LIPITOR) 40 MG tablet TAKE ONE TABLET BY MOUTH ONCE DAILY 90 tablet 1  . busPIRone (BUSPAR) 15 MG tablet Take 1 tablet (15 mg total) by mouth 3 (three) times daily. 90 tablet 1  . carvedilol (COREG) 6.25 MG tablet Take 1 tablet (6.25 mg total) by mouth every 12 (twelve) hours. Please schedule appointment for refills. 180 tablet 0  .  doxycycline (VIBRAMYCIN) 100 MG capsule Take 1 capsule (100 mg total) by mouth 2 (two) times daily. Can give caps or generic 14 capsule 0  . ergocalciferol (VITAMIN D2) 50000 UNITS capsule Take 50,000 Units by mouth once a week.    . Ferrous Sulfate (IRON) 325 (65 FE) MG TABS Take 1 tablet by mouth 2 (two) times daily.    . Fluticasone Furoate-Vilanterol (BREO ELLIPTA) 200-25 MCG/INH AEPB Inhale 1 puff into the lungs daily.    . Fluticasone-Salmeterol (ADVAIR) 250-50 MCG/DOSE AEPB Inhale 1 puff into the lungs 2 (two) times daily.    Marland Kitchen gabapentin (NEURONTIN) 100 MG capsule Take 1 capsule (100 mg total) by mouth at bedtime. 30 capsule 0  . glipiZIDE (GLUCOTROL) 10 MG tablet Take 1 tablet (10 mg total) by mouth 2 (two) times daily before a meal. 180 tablet 3  . insulin NPH-regular Human (NOVOLIN 70/30 RELION) (70-30) 100 UNIT/ML injection Inject under skin 15-30 min before b'fast and supper: 35-40 units 3x a day. ReliOn. 30 mL 5  . Insulin Syringe-Needle U-100 (B-D INS SYR ULTRAFINE .5CC/30G) 30G X 1/2" 0.5 ML MISC Use 3x a day 300 each 3  . metolazone (ZAROXOLYN) 5 MG tablet Take 1 tablet weekly 30 minutes prior to furosemide dose    . pantoprazole (PROTONIX) 40 MG tablet TAKE ONE TABLET BY MOUTH ONCE DAILY 90 tablet 1  . potassium chloride (K-DUR,KLOR-CON) 10 MEQ tablet Take by mouth.    . pramipexole (MIRAPEX) 1 MG tablet TAKE ONE TABLET BY MOUTH AT BEDTIME 30 tablet 5  . sertraline (ZOLOFT) 50 MG tablet Take 1.5 tablets (75 mg total) by mouth daily. 135 tablet 3  . sulfamethoxazole-trimethoprim (BACTRIM DS,SEPTRA DS) 800-160 MG tablet Take 1 tablet by mouth daily.     Marland Kitchen sulfamethoxazole-trimethoprim (BACTRIM DS,SEPTRA DS) 800-160 MG tablet Take 1 tablet by mouth 2 (two) times daily. 10 tablet 0  . torsemide (DEMADEX) 20 MG tablet Take 80 mg by mouth.    . torsemide (DEMADEX) 20 MG tablet TAKE FOUR TABLETS BY MOUTH TWICE DAILY     No current facility-administered medications on file prior to  visit.     BP (!) 133/43   Pulse 70   Temp 98.2 F (36.8 C) (Oral)   Resp 14   SpO2 95%       Objective:   Physical Exam  General- No acute distress. Pleasant patient. Neck- Full range of motion, no jvd Lungs- Clear, even and unlabored. Heart- regular rate and rhythm. Neurologic- CNII- XII grossly intact.   Rt lower ext- pretibial area lower aspect prior  margins same today still swollen, similar warmth and same  tenderness. scab region looks dryer. some serosanguinous dc from edge of scab. No yellow dc today. Negative homans sign.  Left upper ext- just above antecubial area mild 1 cm red area. Faint warmth, pink an mild indurated. No swollen vein seen. No bruit heard brachial area.  Lt lower- moderate swelling, redness and tender. This is new compared to last visit. One small central scab.  Lower ext negative homans signs.      Assessment & Plan:  For persisting cellulitis rt lower ext with appearance of new early left side cellulitis, I want you to continue doxycycline.   Will give rocephin 1 gram injection today.   It is very important you keep the wound care appointment this Wednesday.  I am repeating your cbc today. If your wbc is even worse(or severe low platlets) then may advise ED evaluation.  Also when labs back will try to send note to staff to follow up with hematologist referral I made last week.  Continue with insulin and eat low sugar diet.  Follow up date to be determined after lab review and wound care evaluation.

## 2016-10-18 NOTE — Patient Instructions (Addendum)
For persisting cellulitis rt lower ext with appearance of new early left side cellulitis, I want you to continue doxycycline.   Will give rocephin 1 gram injection today.   It is very important you keep the wound care appointment this Wednesday.  I am repeating your cbc today. If your wbc is even worse(or severe low platlets) then may advise ED evaluation.  Also when labs back will try to send note to staff to follow up with hematologist referral I made last week.  Continue with insulin and eat low sugar diet.  Follow up date to be determined after lab review and wound care evaluation.

## 2016-10-18 NOTE — Telephone Encounter (Signed)
Called and Erin [name] spoke with the pt regarding her recent lab results.//AB/CMA

## 2016-10-20 ENCOUNTER — Encounter: Payer: Medicare Other | Attending: Internal Medicine | Admitting: Internal Medicine

## 2016-10-20 DIAGNOSIS — I87333 Chronic venous hypertension (idiopathic) with ulcer and inflammation of bilateral lower extremity: Secondary | ICD-10-CM | POA: Insufficient documentation

## 2016-10-20 DIAGNOSIS — Z794 Long term (current) use of insulin: Secondary | ICD-10-CM | POA: Insufficient documentation

## 2016-10-20 DIAGNOSIS — E11622 Type 2 diabetes mellitus with other skin ulcer: Secondary | ICD-10-CM | POA: Diagnosis not present

## 2016-10-20 DIAGNOSIS — I509 Heart failure, unspecified: Secondary | ICD-10-CM | POA: Insufficient documentation

## 2016-10-20 DIAGNOSIS — I130 Hypertensive heart and chronic kidney disease with heart failure and stage 1 through stage 4 chronic kidney disease, or unspecified chronic kidney disease: Secondary | ICD-10-CM | POA: Insufficient documentation

## 2016-10-20 DIAGNOSIS — M1990 Unspecified osteoarthritis, unspecified site: Secondary | ICD-10-CM | POA: Insufficient documentation

## 2016-10-20 DIAGNOSIS — L97221 Non-pressure chronic ulcer of left calf limited to breakdown of skin: Secondary | ICD-10-CM | POA: Diagnosis not present

## 2016-10-20 DIAGNOSIS — L97811 Non-pressure chronic ulcer of other part of right lower leg limited to breakdown of skin: Secondary | ICD-10-CM | POA: Diagnosis not present

## 2016-10-20 DIAGNOSIS — J45909 Unspecified asthma, uncomplicated: Secondary | ICD-10-CM | POA: Diagnosis not present

## 2016-10-20 DIAGNOSIS — L97812 Non-pressure chronic ulcer of other part of right lower leg with fat layer exposed: Secondary | ICD-10-CM | POA: Diagnosis not present

## 2016-10-20 DIAGNOSIS — N189 Chronic kidney disease, unspecified: Secondary | ICD-10-CM | POA: Diagnosis not present

## 2016-10-20 DIAGNOSIS — E1122 Type 2 diabetes mellitus with diabetic chronic kidney disease: Secondary | ICD-10-CM | POA: Diagnosis not present

## 2016-10-20 DIAGNOSIS — M109 Gout, unspecified: Secondary | ICD-10-CM | POA: Diagnosis not present

## 2016-10-20 DIAGNOSIS — L97222 Non-pressure chronic ulcer of left calf with fat layer exposed: Secondary | ICD-10-CM | POA: Diagnosis not present

## 2016-10-20 DIAGNOSIS — E1142 Type 2 diabetes mellitus with diabetic polyneuropathy: Secondary | ICD-10-CM | POA: Insufficient documentation

## 2016-10-20 DIAGNOSIS — Z87891 Personal history of nicotine dependence: Secondary | ICD-10-CM | POA: Diagnosis not present

## 2016-10-20 DIAGNOSIS — I890 Lymphedema, not elsewhere classified: Secondary | ICD-10-CM | POA: Insufficient documentation

## 2016-10-21 NOTE — Progress Notes (Signed)
PORSHEA, JANOWSKI ([id number]) Visit Report for 10/20/2016 Allergy List Details Patient Name: Erin [name], Erin [name]. Date of Service: 10/20/2016 10:30 AM Medical Record Patient Account Number: [id number] Number: Treating RN: Ahmed Prima Date of Birth/Sex: 19-Feb-1946 (71 y.o. Female) Other Clinician: Carollee Herter, Treating Linton Ham Primary Care Mily Malecki: Kendrick Fries Shadell Brenn/Extender: G Referring Aaric Dolph: Mackie Pai Weeks in Treatment: 0 Allergies Active Allergies NKDA Allergy Notes Electronic Signature(s) Signed: 10/20/2016 5:03:57 PM By: Alric Quan Entered By: Alric Quan on 10/20/2016 10:45:46 [name] ([id number]) -------------------------------------------------------------------------------- Arrival Information Details Patient Name: Erin, ROOKS B. Date of Service: 10/20/2016 10:30 AM Medical Record Patient Account Number: [id number] Number: Treating RN: Ahmed Prima Date of Birth/Sex: 1945/04/29 (71 y.o. Female) Other Clinician: Carollee Herter, Treating Linton Ham Primary Care Preciosa Bundrick: Kendrick Fries Hazelene Doten/Extender: G Referring Talis Iwan: Mal Misty in Treatment: 0 Visit Information Patient Arrived: Wheel Chair Arrival Time: 10:39 Accompanied By: daughter in law Transfer Assistance: EasyPivot Patient Lift Patient Identification Verified: Yes Secondary Verification Process Yes Completed: Patient Requires Transmission- No Based Precautions: Patient Has Alerts: Yes Patient Alerts: DM II L ABI non- compressible R ABI non- compressible Electronic Signature(s) Signed: 10/20/2016 5:03:57 PM By: Alric Quan Entered By: Alric Quan on 10/20/2016 11:04:06 [name] ([id number]) -------------------------------------------------------------------------------- Clinic Level of Care Assessment Details Patient Name: Erin Mech B. Date of Service: 10/20/2016 10:30 AM Medical Record Patient  Account Number: [id number] Number: Treating RN: Ahmed Prima Date of Birth/Sex: 1945/08/06 (71 y.o. Female) Other Clinician: Carollee Herter, Treating Linton Ham Primary Care Baldemar Dady: Kendrick Fries Cassity Christian/Extender: G Referring Abbeygail Igoe: Mal Misty in Treatment: 0 Clinic Level of Care Assessment Items TOOL 1 Quantity Score X - Use when EandM and Procedure is performed on INITIAL visit 1 0 ASSESSMENTS - Nursing Assessment / Reassessment X - General Physical Exam (combine w/ comprehensive assessment (listed just 1 20 below) when performed on new pt. evals) X - Comprehensive Assessment (HX, ROS, Risk Assessments, Wounds Hx, etc.) 1 25 ASSESSMENTS - Wound and Skin Assessment / Reassessment []  - Dermatologic / Skin Assessment (not related to wound area) 0 ASSESSMENTS - Ostomy and/or Continence Assessment and Care X - Incontinence Assessment and Management 1 10 X - Ostomy Care Assessment and Management (repouching, etc.) 1 20 PROCESS - Coordination of Care []  - Simple Patient / Family Education for ongoing care 0 X - Complex (extensive) Patient / Family Education for ongoing care 1 20 X - Staff obtains Programmer, systems, Records, Test Results / Process Orders 1 10 []  - Staff telephones HHA, Nursing Homes / Clarify orders / etc 0 []  - Routine Transfer to another Facility (non-emergent condition) 0 []  - Routine Hospital Admission (non-emergent condition) 0 X - New Admissions / Biomedical engineer / Ordering NPWT, Apligraf, etc. 1 15 []  - Emergency Hospital Admission (emergent condition) 0 PROCESS - Special Needs []  - Pediatric / Minor Patient Management 0 Furio, Lakya B. ([id number]) []  - Isolation Patient Management 0 []  - Hearing / Language / Visual special needs 0 []  - Assessment of Community assistance (transportation, D/C planning, etc.) 0 []  - Additional assistance / Altered mentation 0 []  - Support Surface(s) Assessment (bed, cushion, seat, etc.)  0 INTERVENTIONS - Miscellaneous []  - External ear exam 0 []  - Patient Transfer (multiple staff / Civil Service fast streamer / Similar devices) 0 []  - Simple Staple / Suture removal (25 or less) 0 []  - Complex Staple / Suture removal (26 or more) 0 []  - Hypo/Hyperglycemic Management (do not check if billed separately) 0 X - Ankle / Brachial Index (  ABI) - do not check if billed separately 1 15 Has the patient been seen at the hospital within the last three years: Yes Total Score: 135 Level Of Care: New/Established - Level 4 Electronic Signature(s) Signed: 10/20/2016 5:03:57 PM By: Alric Quan Entered By: Alric Quan on 10/20/2016 12:59:32 [name] ([id number]) -------------------------------------------------------------------------------- Encounter Discharge Information Details Patient Name: [name], [name] Date of Service: 10/20/2016 10:30 AM Medical Record Patient Account Number: [id number] Number: Treating RN: Ahmed Prima Date of Birth/Sex: 08-Dec-1945 (71 y.o. Female) Other Clinician: Carollee Herter, Treating Linton Ham Primary Care Theadore Blunck: Kendrick Fries Travon Crochet/Extender: G Referring Lawson Mahone: Mal Misty in Treatment: 0 Encounter Discharge Information Items Discharge Pain Level: 0 Discharge Condition: Stable Ambulatory Status: Wheelchair Discharge Destination: Home Transportation: Private Auto Accompanied By: daughter in law Schedule Follow-up Appointment: Yes Medication Reconciliation completed and provided to Patient/Care No Donivin Wirt: Provided on Clinical Summary of Care: 10/20/2016 Form Type Recipient Paper Patient BW Electronic Signature(s) Signed: 10/20/2016 12:12:27 PM By: Ruthine Dose Entered By: Ruthine Dose on 10/20/2016 12:12:26 [name] ([id number]) -------------------------------------------------------------------------------- Lower Extremity Assessment Details Patient Name: [name] Miller, [name] Date of Service:  10/20/2016 10:30 AM Medical Record Patient Account Number: [id number] Number: Treating RN: Ahmed Prima Date of Birth/Sex: June 25, 1945 (71 y.o. Female) Other Clinician: Carollee Herter, Treating ROBSON, [place] Primary Care Betzaida Cremeens: Kendrick Fries Kseniya Grunden/Extender: G Referring Laniesha Das: Mackie Pai Weeks in Treatment: 0 Edema Assessment Assessed: [Left: No] [Right: No] Edema: [Left: Yes] [Right: Yes] Calf Left: Right: Point of Measurement: 32 cm From Medial Instep 50.5 cm 53.7 cm Ankle Left: Right: Point of Measurement: 9 cm From Medial Instep 34.5 cm 32.2 cm Vascular Assessment Claudication: Claudication Assessment [Left:Intermittent] [Right:Intermittent] Pulses: Dorsalis Pedis Palpable: [Left:Yes] [Right:Yes] Doppler Audible: [Left:Yes] [Right:Yes] Posterior Tibial Palpable: [Left:Yes] [Right:Yes] Doppler Audible: [Left:Yes] [Right:Yes] Extremity colors, hair growth, and conditions: Extremity Color: [Left:Hyperpigmented] [Right:Hyperpigmented] Temperature of Extremity: [Left:Warm] [Right:Warm] Capillary Refill: [Left:< 3 seconds] [Right:< 3 seconds] Toe Nail Assessment Left: Right: Thick: Yes Yes Discolored: No No Deformed: No No Improper Length and Hygiene: No No BELLIZZI, Jordin B. ([id number]) Notes L ABI non-compressible R ABI non-compressible Electronic Signature(s) Signed: 10/20/2016 5:03:57 PM By: Alric Quan Entered By: Alric Quan on 10/20/2016 11:07:24 [name] ([id number]) -------------------------------------------------------------------------------- Multi Wound Chart Details Patient Name: [name] Miller, Glada B. Date of Service: 10/20/2016 10:30 AM Medical Record Patient Account Number: [id number] Number: Treating RN: Ahmed Prima Date of Birth/Sex: [birth year] (71 y.o. 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055] [2:0.55] [N/A:N/A] Classification: [1:Grade 1] [2:Grade 1] [N/A:N/A] Exudate Amount: [1:Medium] [2:Large] [N/A:N/A] Exudate Type: [1:Serosanguineous] [2:Purulent] [N/A:N/A] Exudate Color: [1:red, brown] [2:yellow, brown, green] [N/A:N/A] Wound Margin: [1:Thickened] [2:Distinct, outline attached N/A] Granulation Amount: [  1:None Present (0%)] [2:None Present (0%)] [N/A:N/A] Necrotic Amount: [1:Large (67-100%)] [2:Large (67-100%)] [N/A:N/A] Necrotic Tissue: [1:Eschar] [2:Eschar, Adherent Slough N/A] Epithelialization: [1:N/A] [2:None] [N/A:N/A] Debridement: [N/A:N/A] Debridement ([id number] Debridement ([id number]) [id number]) Pre-procedure 11:46 11:46 N/A Verification/Time Out Taken: Pain Control: Lidocaine 4% Topical Lidocaine 4% Topical N/A Solution Solution Tissue Debrided: Fibrin/Slough, Exudates, Fibrin/Slough, Exudates,  N/A Subcutaneous Subcutaneous Level: Skin/Subcutaneous Skin/Subcutaneous N/A Tissue Tissue Debridement Area (sq 0.7 7 N/A cm): Instrument: Curette Curette N/A Bleeding: Minimum Minimum N/A Hemostasis Achieved: Pressure Pressure N/A Procedural Pain: 0 0 N/A Post Procedural Pain: 0 0 N/A Debridement Treatment Procedure was tolerated Procedure was tolerated N/A Response: well well Post Debridement 0.7x0.6x0.1 2x2.1x0.1 N/A Measurements L x W x D (cm) Post Debridement 0.033 0.33 N/A Volume: (cm) Periwound Skin Texture: No Abnormalities Noted No Abnormalities Noted N/A Periwound Skin No Abnormalities Noted No Abnormalities Noted N/A Moisture: Periwound Skin Color: No Abnormalities Noted No Abnormalities Noted N/A Temperature: No Abnormality No Abnormality N/A Tenderness on Yes Yes N/A Palpation: Wound Preparation: Ulcer Cleansing: Ulcer Cleansing: N/A Rinsed/Irrigated with Rinsed/Irrigated with Saline Saline Topical Anesthetic Topical Anesthetic Applied: Other: lidocaine Applied: Other: lidocaine 4% 4% Procedures Performed: Debridement Debridement N/A Treatment Notes Wound #1 (Left Lower Leg) 1. Cleansed with: Clean wound with Normal Saline 2. Anesthetic Topical Lidocaine 4% cream to wound bed prior to debridement 3. Peri-wound Care: Barrier cream [place], Darnell B. ([id number]) Other peri-wound care (specify in notes) 4. Dressing Applied: Aquacel Ag 5. Secondary Dressing Applied ABD Pad Dry Gauze 7. Secured with Tape 3 Layer Compression System - Bilateral Notes unna to anchor, TCA, Xtrasorb Wound #2 (Right, Anterior Lower Leg) 1. Cleansed with: Clean wound with Normal Saline 2. Anesthetic Topical Lidocaine 4% cream to wound bed prior to debridement 3. Peri-wound Care: Barrier cream Other peri-wound care (specify in notes) 4. Dressing Applied: Aquacel Ag 5. Secondary Dressing Applied ABD Pad Dry Gauze 7. Secured with Tape 3 Layer Compression System -  Bilateral Notes unna to anchor, TCA, Manufacturing systems engineer) Signed: 10/20/2016 4:47:30 PM By: Linton Ham MD Entered By: Linton Ham on 10/20/2016 12:42:27 [name] ([id number]) -------------------------------------------------------------------------------- [place] Details Patient Name: DONNIE, GEDEON B. Date of Service: 10/20/2016 10:30 AM Medical Record Patient Account Number: [id number] Number: Treating RN: Ahmed Prima Date of Birth/Sex: 06-26-1945 (71 y.o. Female) Other Clinician: Carollee Herter, Treating Linton Ham Primary Care Kieu Quiggle: Kendrick Fries Sabien Umland/Extender: G Referring Varnell Donate: Mal Misty in Treatment: 0 Active Inactive ` Abuse / Safety / Falls / Self Care Management Nursing Diagnoses: Potential for falls Goals: Patient will not experience any injury related to falls Date Initiated: 10/20/2016 Target Resolution Date: 02/12/2017 Goal Status: Active Interventions: Assess impairment of mobility on admission and as needed per policy Notes: ` Nutrition Nursing Diagnoses: Imbalanced nutrition Impaired glucose control: actual or potential Potential for alteratiion in Nutrition/Potential for imbalanced nutrition Goals: Patient/caregiver will maintain therapeutic glucose control Date Initiated: 10/20/2016 Target Resolution Date: 02/12/2017 Goal Status: Active Interventions: Assess patient nutrition upon admission and as needed per policy Notes: ` Orientation to the [place], [place]. ([id number]) Nursing Diagnoses: Knowledge deficit related to the wound healing center program Goals: Patient/caregiver will verbalize understanding of the [place] Program Date Initiated: 10/20/2016 Target Resolution Date: 11/06/2016 Goal Status: Active Interventions: Provide education on orientation to the wound center Notes: ` Pain, Acute or Chronic Nursing  Diagnoses: Pain, acute or chronic: actual or potential Potential alteration in comfort, pain Goals: Patient/caregiver will verbalize adequate pain control between visits Date Initiated: 10/20/2016  Target Resolution Date: 02/12/2017 Goal Status: Active Interventions: Complete pain assessment as per visit requirements Notes: ` Wound/Skin Impairment Nursing Diagnoses: Impaired tissue integrity Knowledge deficit related to ulceration/compromised skin integrity Goals: Ulcer/skin breakdown will have a volume reduction of 80% by week 12 Date Initiated: 10/20/2016 Target Resolution Date: 02/05/2017 Goal Status: Active Interventions: Assess patient/caregiver ability to perform ulcer/skin care regimen upon admission and as needed Assess ulceration(s) every visit Notes: MARCH, JOOS ([id number]) Electronic Signature(s) Signed: 10/20/2016 5:03:57 PM By: Alric Quan Entered By: Alric Quan on 10/20/2016 11:34:01 [name] ([id number]) -------------------------------------------------------------------------------- Pain Assessment Details Patient Name: [name] Miller, Alesia B. Date of Service: 10/20/2016 10:30 AM Medical Record Patient Account Number: [id number] Number: Treating RN: Ahmed Prima Date of Birth/Sex: 1945/07/23 (71 y.o. Female) Other Clinician: Carollee Herter, Treating Linton Ham Primary Care Mitali [place]: Kendrick Fries Anetria Harwick/Extender: G Referring Vivienne Sangiovanni: Mal Misty in Treatment: 0 Active Problems Location of Pain Severity and Description of Pain Patient Has Paino No Site Locations With Dressing Change: No Pain Management and Medication Current Pain Management: Electronic Signature(s) Signed: 10/20/2016 5:03:57 PM By: Alric Quan Entered By: Alric Quan on 10/20/2016 10:41:32 [name] ([id number]) -------------------------------------------------------------------------------- Patient/Caregiver Education  Details Patient Name: Erin Mech B. Date of Service: 10/20/2016 10:30 AM Medical Record Patient Account Number: [id number] Number: Treating RN: Ahmed Prima [birth year] (71 y.o. Other Clinician: Date of Birth/Gender: Female) Treating ROBSON, MICHAEL [place], Physician/Extender: G Primary Care Physician: Tora Kindred in Treatment: 0 Referring Physician: Mackie Pai Education Assessment Education Provided To: Patient Education Topics Provided Wound/Skin Impairment: Handouts: Other: change dressing as ordered Methods: Demonstration, Explain/Verbal Responses: State content correctly Electronic Signature(s) Signed: 10/20/2016 5:03:57 PM By: Alric Quan Entered By: Alric Quan on 10/20/2016 11:34:51 [name] ([id number]) -------------------------------------------------------------------------------- Wound Assessment Details Patient Name: FARFAN, Cristina B. Date of Service: 10/20/2016 10:30 AM Medical Record Patient Account Number: [id number] Number: Treating RN: Ahmed Prima Date of Birth/Sex: 15-Jul-1945 (71 y.o. Female) Other Clinician: Carollee Herter, Treating Linton Ham Primary Care Merica Prell: Kendrick Fries Esly Selvage/Extender: G Referring Gionni Vaca: Mackie Pai Weeks in Treatment: 0 Wound Status Wound Number: 1 Primary Diabetic Wound/Ulcer of the Lower Etiology: Extremity Wound Location: Lower Leg Secondary Trauma, Other Wounding Event: Trauma Etiology: Date Acquired: 09/20/2016 Wound Open Weeks Of Treatment: 0 Status: Clustered Wound: No Comorbid Cataracts, Asthma, Congestive Heart History: Failure, Hypertension, Type II Diabetes, Gout, Osteoarthritis, Neuropathy Photos Photo Uploaded By: Alric Quan on 10/20/2016 16:46:12 Wound Measurements Length: (cm) 1 % Reduction in Width: (cm) 0.7 % Reduction in Depth: (cm) 0.1 Tunneling: Area: (cm) 0.55 Undermining: Volume: (cm)  0.055 Area: Volume: No No Wound Description Classification: Grade 1 Wound Margin: Thickened Exudate Amount: Medium Exudate Type: Serosanguineous Exudate Color: red, brown RAGAN, DUHON B. ([id number]) Foul Odor After Cleansing: No Slough/Fibrino No Wound Bed Granulation Amount: None Present (0%) Necrotic Amount: Large (67-100%) Necrotic Quality: Eschar Periwound Skin Texture Texture Color No Abnormalities Noted: No No Abnormalities Noted: No Moisture Temperature / Pain No Abnormalities Noted: No Temperature: No Abnormality Tenderness on Palpation: Yes Wound Preparation Ulcer Cleansing: Rinsed/Irrigated with Saline Topical Anesthetic Applied: Other: lidocaine 4%, Electronic Signature(s) Signed: 10/20/2016 5:03:57 PM By: Alric Quan Entered By: Alric Quan on 10/20/2016 11:13:01 [name] ([id number]) -------------------------------------------------------------------------------- Wound Assessment Details Patient Name: PROFIT, Mega B. Date of Service: 10/20/2016 10:30 AM Medical Record Patient Account Number: [id number] Number: Treating RN: Ahmed Prima Date of Birth/Sex: 05-16-1945 (71 y.o. Female) Other Clinician: Carollee Herter, Treating Linton Ham Primary Care Tevita Gomer: Kendrick Fries Christ Fullenwider/Extender: G Referring Camila [place]: Mal Misty  in Treatment: 0 Wound Status Wound Number: 2 Primary Diabetic Wound/Ulcer of the Lower Etiology: Extremity Wound Location: Right Lower Leg - Anterior Wound Open Wounding Event: Blister Status: Date Acquired: 09/20/2016 Comorbid Cataracts, Asthma, Congestive Heart Weeks Of Treatment: 0 History: Failure, Hypertension, Type II Diabetes, Clustered Wound: No Gout, Osteoarthritis, Neuropathy Photos Photo Uploaded By: Alric Quan on 10/20/2016 16:46:54 Wound Measurements Length: (cm) 3.5 Width: (cm) 2 Depth: (cm) 0.1 Area: (cm) 5.498 Volume: (cm) 0.55 % Reduction in Area: %  Reduction in Volume: Epithelialization: None Tunneling: No Undermining: No Wound Description Classification: Grade 1 Foul Odor Afte Wound Margin: Distinct, outline attached Slough/Fibrino Exudate Amount: Large Exudate Type: Purulent Exudate Color: yellow, brown, green r Cleansing: No Yes Wound Bed Granulation Amount: None Present (0%) Necrotic Amount: Large (67-100%) Aceituno, Marieli B. ([id number]) Necrotic Quality: Eschar, Adherent Slough Periwound Skin Texture Texture Color No Abnormalities Noted: No No Abnormalities Noted: No Moisture Temperature / Pain No Abnormalities Noted: No Temperature: No Abnormality Tenderness on Palpation: Yes Wound Preparation Ulcer Cleansing: Rinsed/Irrigated with Saline Topical Anesthetic Applied: Other: lidocaine 4%, Treatment Notes Wound #2 (Right, Anterior Lower Leg) 1. Cleansed with: Clean wound with Normal Saline 2. Anesthetic Topical Lidocaine 4% cream to wound bed prior to debridement 3. Peri-wound Care: Barrier cream Other peri-wound care (specify in notes) 4. Dressing Applied: Aquacel Ag 5. Secondary Dressing Applied ABD Pad Dry Gauze 7. Secured with Tape 3 Layer Compression System - Bilateral Notes unna to anchor, TCA, Manufacturing systems engineer) Signed: 10/20/2016 5:03:57 PM By: Alric Quan Entered By: Alric Quan on 10/20/2016 11:16:43 [name] ([id number]) -------------------------------------------------------------------------------- Vitals Details Patient Name: Erin Mech B. Date of Service: 10/20/2016 10:30 AM Medical Record Patient Account Number: [id number] Number: Treating RN: Ahmed Prima Date of Birth/Sex: 12-Aug-1945 (71 y.o. Female) Other Clinician: Carollee Herter, Treating ROBSON, [place] Primary Care Ajee Heasley: Kendrick Fries Faizan Geraci/Extender: G Referring Keavon Sensing: Mal Misty in Treatment: 0 Vital Signs Time Taken: 10:41 Temperature (F): 97.6 Height  (in): 64 Pulse (bpm): 86 Source: Stated Respiratory Rate (breaths/min): 18 Weight (lbs): 260 Blood Pressure (mmHg): 166/59 Source: Measured Reference Range: 80 - 120 mg / dl Body Mass Index (BMI): 44.6 Notes Made Dr. Dellia Nims aware of pts BP. Pt states that she has not taken her BP medication today but she is going to take it when she gets home. Electronic Signature(s) Signed: 10/20/2016 5:03:57 PM By: Alric Quan Entered By: Alric Quan on 10/20/2016 10:45:16

## 2016-10-22 NOTE — Progress Notes (Signed)
Erin, [name] ([id number]) Visit Report for 10/20/2016 Abuse/Suicide Risk Screen Details Patient Name: Erin [name], Erin [name]. Date of Service: 10/20/2016 10:30 AM Medical Record Patient Account Number: [id number] Number: Treating RN: Ahmed Prima 07/12/1945 (71 y.o. Other Clinician: Date of Birth/Sex: Female) Treating ROBSON, MICHAEL [place], Treyana Sturgell/Extender: G Primary Care Lacresia Darwish: Kendrick Fries Referring Sonal Dorwart: Mackie Pai Weeks in Treatment: 0 Abuse/Suicide Risk Screen Items Answer ABUSE/SUICIDE RISK SCREEN: Has anyone close to you tried to hurt or harm you recentlyo No Do you feel uncomfortable with anyone in your familyo No Has anyone forced you do things that you didnot want to doo No Do you have any thoughts of harming yourselfo No Patient displays signs or symptoms of abuse and/or neglect. No Electronic Signature(s) Signed: 10/20/2016 5:03:57 PM By: Alric Quan Entered By: Alric Quan on 10/20/2016 10:52:47 [name] ([id number]) -------------------------------------------------------------------------------- Activities of Daily Living Details Patient Name: Erin, [name] Date of Service: 10/20/2016 10:30 AM Medical Record Patient Account Number: [id number] Number: Treating RN: Ahmed Prima 03/30/1946 (71 y.o. Other Clinician: Date of Birth/Sex: Female) Treating ROBSON, MICHAEL [place], Yigit Norkus/Extender: G Primary Care Maleta Pacha: Kendrick Fries Referring Cali Cuartas: Mackie Pai Weeks in Treatment: 0 Activities of Daily Living Items Answer Activities of Daily Living (Please select one for each item) Drive Automobile Completely Able Take Medications Completely Able Use Telephone Completely Able Care for Appearance Completely Able Use Toilet Completely Able Bath / Shower Completely Able Dress Self Completely Able Feed Self Completely Able Walk Completely Able Get In / Out Bed Completely Able Housework  Completely Able Prepare Meals Completely Able Handle Money Completely Able Shop for Self Completely Able Electronic Signature(s) Signed: 10/20/2016 5:03:57 PM By: Alric Quan Entered By: Alric Quan on 10/20/2016 10:53:04 [name] ([id number]) -------------------------------------------------------------------------------- Education Assessment Details Patient Name: Erin Mech B. Date of Service: 10/20/2016 10:30 AM Medical Record Patient Account Number: [id number] Number: Treating RN: Ahmed Prima 08-19-1945 (71 y.o. Other Clinician: Date of Birth/Sex: Female) Treating ROBSON, MICHAEL [place], Alonah Lineback/Extender: G Primary Care Denvil Canning: Kendrick Fries Referring Brielle Moro: Mal Misty in Treatment: 0 Primary Learner Assessed: Patient Learning Preferences/Education Level/Primary Language Learning Preference: Explanation, Printed Material Highest Education Level: High School Preferred Language: English Cognitive Barrier Assessment/Beliefs Language Barrier: No Translator Needed: No Memory Deficit: No Emotional Barrier: No Physical Barrier Assessment Impaired Vision: No Impaired Hearing: No Decreased Hand dexterity: No Knowledge/Comprehension Assessment Knowledge Level: Medium Comprehension Level: Medium Ability to understand written Medium instructions: Ability to understand verbal Medium instructions: Motivation Assessment Anxiety Level: Calm Cooperation: Cooperative Education Importance: Acknowledges Need Interest in Health Problems: Asks Questions Perception: Coherent Willingness to Engage in Self- Medium Management Activities: Readiness to Engage in Self- Medium Management Activities: NATSUKO, KELSAY ([id number]) Electronic Signature(s) Signed: 10/20/2016 5:03:57 PM By: Alric Quan Entered By: Alric Quan on 10/20/2016 10:53:27 [name]  ([id number]) -------------------------------------------------------------------------------- Fall Risk Assessment Details Patient Name: Erin Mech B. Date of Service: 10/20/2016 10:30 AM Medical Record Patient Account Number: [id number] Number: Treating RN: Ahmed Prima 1946-03-02 (71 y.o. Other Clinician: Date of Birth/Sex: Female) Treating ROBSON, MICHAEL [place], Breshae Belcher/Extender: G Primary Care Edee Nifong: Kendrick Fries Referring Ronelle Michie: Mackie Pai Weeks in Treatment: 0 Fall Risk Assessment Items Have you had 2 or more falls in the last 12 monthso 0 No Have you had any fall that resulted in injury in the last 12 monthso 0 Yes FALL RISK ASSESSMENT: History of falling - immediate or within 3 months 0 No Secondary diagnosis 15 Yes Ambulatory aid None/bed rest/wheelchair/nurse 0 No Crutches/cane/walker 15  Yes Furniture 0 No IV Access/Saline Lock 0 No Gait/Training Normal/bed rest/immobile 0 No Weak 0 No Impaired 0 No Mental Status Oriented to own ability 0 Yes Electronic Signature(s) Signed: 10/20/2016 5:03:57 PM By: Alric Quan Entered By: Alric Quan on 10/20/2016 10:53:59 Kindel, Omaira B. ([id number]) -------------------------------------------------------------------------------- Foot Assessment Details Patient Name: Erin [name], Breah B. Date of Service: 10/20/2016 10:30 AM Medical Record Patient Account Number: [id number] Number: Treating RN: Ahmed Prima Apr 26, 1945 (71 y.o. Other Clinician: Date of Birth/Sex: Female) Treating ROBSON, MICHAEL [place], Schae Cando/Extender: G Primary Care Jyasia Markoff: Kendrick Fries Referring Rocco Kerkhoff: Mackie Pai Weeks in Treatment: 0 Foot Assessment Items Site Locations + = Sensation present, - = Sensation absent, C = Callus, U = Ulcer R = Redness, W = Warmth, M = Maceration, PU = Pre-ulcerative lesion F = Fissure, S = Swelling, D = Dryness Assessment Right: Left: Other Deformity: No  No Prior Foot Ulcer: No No Prior Amputation: No No Charcot Joint: No No Ambulatory Status: Ambulatory With Help Assistance Device: Cane Gait: Steady Electronic Signature(s) Signed: 10/20/2016 5:03:57 PM By: Earney Navy, Mardee BMarland Kitchen ([id number]) Entered By: Alric Quan on 10/20/2016 11:02:06 [name] ([id number]) -------------------------------------------------------------------------------- Nutrition Risk Assessment Details Patient Name: EICHENBERGER, Carlyann B. Date of Service: 10/20/2016 10:30 AM Medical Record Patient Account Number: [id number] Number: Treating RN: Ahmed Prima 09/27/1945 (71 y.o. Other Clinician: Date of Birth/Sex: Female) Treating ROBSON, MICHAEL [place], Anyae Griffith/Extender: G Primary Care Esmeralda Blanford: Kendrick Fries Referring Devrin Monforte: Mackie Pai Weeks in Treatment: 0 Height (in): 64 Weight (lbs): 260 Body Mass Index (BMI): 44.6 Nutrition Risk Assessment Items NUTRITION RISK SCREEN: I have an illness or condition that made me change the kind and/or 2 Yes amount of food I eat I eat fewer than two meals per day 0 No I eat few fruits and vegetables, or milk products 0 No I have three or more drinks of beer, liquor or wine almost every day 0 No I have tooth or mouth problems that make it hard for me to eat 0 No I don't always have enough money to buy the food I need 0 No I eat alone most of the time 0 No I take three or more different prescribed or over-the-counter drugs a 1 Yes day Without wanting to, I have lost or gained 10 pounds in the last six 0 No months I am not always physically able to shop, cook and/or feed myself 0 No Nutrition Protocols Good Risk Protocol Moderate Risk Protocol Electronic Signature(s) Signed: 10/20/2016 5:03:57 PM By: Alric Quan Entered By: Alric Quan on 10/20/2016 10:55:20

## 2016-10-22 NOTE — Progress Notes (Signed)
Erin [name], Erin [name] ([id number]) Visit Report for 10/20/2016 Chief Complaint Document Details Patient Name: Erin [name], Erin [name]. Date of Service: 10/20/2016 10:30 AM Medical Record Patient Account Number: [id number] Number: Treating RN: Ahmed Prima 14-Jul-1945 (71 y.o. Other Clinician: Date of Birth/Sex: Female) Treating ROBSON, MICHAEL [place], Provider/Extender: G Primary Care Provider: Kendrick Fries Referring Provider: Mackie Pai Weeks in Treatment: 0 Information Obtained from: Patient Chief Complaint 10/20/16 patient is here for wounds on her bilateral anterior lower legs Electronic Signature(s) Signed: 10/20/2016 4:47:30 PM By: Linton Ham MD Entered By: Linton Ham on 10/20/2016 12:43:20 Erin [name] ([id number]) -------------------------------------------------------------------------------- Debridement Details Patient Name: Erin [name], Erin [name] B. Date of Service: 10/20/2016 10:30 AM Medical Record Patient Account Number: [id number] Number: Treating RN: Ahmed Prima 23-Mar-1946 (71 y.o. Other Clinician: Date of Birth/Sex: Female) Treating ROBSON, MICHAEL [place], Provider/Extender: G Primary Care Provider: Kendrick Fries Referring Provider: Mal Misty in Treatment: 0 Debridement Performed for Wound #1 Left Lower Leg Assessment: Performed By: Physician Ricard Dillon, MD Debridement: Debridement Severity of Tissue Pre Fat layer exposed Debridement: Pre-procedure Verification/Time Out Yes - 11:46 Taken: Start Time: 11:48 Pain Control: Lidocaine 4% Topical Solution Level: Skin/Subcutaneous Tissue Total Area Debrided (L x 1 (cm) x 0.7 (cm) = 0.7 (cm) W): Tissue and other Viable, Non-Viable, Exudate, Fibrin/Slough, Subcutaneous material debrided: Instrument: Curette Bleeding: Minimum Hemostasis Achieved: Pressure End Time: 11:50 Procedural Pain: 0 Post Procedural Pain: 0 Response to Treatment: Procedure was  tolerated well Post Debridement Measurements of Total Wound Length: (cm) 0.7 Width: (cm) 0.6 Depth: (cm) 0.1 Volume: (cm) 0.033 Character of Wound/Ulcer Post Requires Further Debridement Debridement: Severity of Tissue Post Debridement: Fat layer exposed Post Procedure Diagnosis Same as Pre-procedure Erin [name], Erin [name] ([id number]) Electronic Signature(s) Signed: 10/20/2016 4:47:30 PM By: Linton Ham MD Signed: 10/20/2016 5:03:57 PM By: Alric Quan Entered By: Linton Ham on 10/20/2016 12:42:48 Erin [name] ([id number]) -------------------------------------------------------------------------------- Debridement Details Patient Name: Erin [name], [name] Date of Service: 10/20/2016 10:30 AM Medical Record Patient Account Number: [id number] Number: Treating RN: Ahmed Prima 04-Jun-1945 (71 y.o. Other Clinician: Date of Birth/Sex: Female) Treating ROBSON, MICHAEL [place], Provider/Extender: G Primary Care Provider: Kendrick Fries Referring Provider: Mal Misty in Treatment: 0 Debridement Performed for Wound #2 Right,Anterior Lower Leg Assessment: Performed By: Physician Ricard Dillon, MD Debridement: Debridement Severity of Tissue Pre Fat layer exposed Debridement: Pre-procedure Verification/Time Out Yes - 11:46 Taken: Start Time: 11:47 Pain Control: Lidocaine 4% Topical Solution Level: Skin/Subcutaneous Tissue Total Area Debrided (L x 3.5 (cm) x 2 (cm) = 7 (cm) W): Tissue and other Viable, Non-Viable, Exudate, Fibrin/Slough, Subcutaneous material debrided: Instrument: Curette Bleeding: Minimum Hemostasis Achieved: Pressure End Time: 11:49 Procedural Pain: 0 Post Procedural Pain: 0 Response to Treatment: Procedure was tolerated well Post Debridement Measurements of Total Wound Length: (cm) 2 Width: (cm) 2.1 Depth: (cm) 0.1 Volume: (cm) 0.33 Character of Wound/Ulcer Post Requires Further  Debridement Debridement: Severity of Tissue Post Debridement: Fat layer exposed Post Procedure Diagnosis Same as Pre-procedure Erin [name], Erin [name] ([id number]) Electronic Signature(s) Signed: 10/20/2016 4:47:30 PM By: Linton Ham MD Signed: 10/20/2016 5:03:57 PM By: Alric Quan Entered By: Linton Ham on 10/20/2016 12:42:56 Erin [name] ([id number]) -------------------------------------------------------------------------------- HPI Details Patient Name: [name], [name] Date of Service: 10/20/2016 10:30 AM Medical Record Patient Account Number: [id number] Number: Treating RN: Ahmed Prima 10/01/1945 (71 y.o. Other Clinician: Date of Birth/Sex: Female) Treating ROBSON, MICHAEL [place], Provider/Extender: G Primary Care Provider: Kendrick Fries Referring Provider: Mackie Pai Weeks in Treatment: 0 History of Present Illness  HPI Description: 10/20/16; this is a independent 71 year old woman who is a type II diabetic. She has had wounds on her right anterior to anterior legs for about a month. She thinks this was probably minor trauma although as opposed to prior wound she's had an these areas they have not closed over. She has been seeing her primary care physician for cellulitis I think around the right leg wound. She is been receiving doxycycline and I think also parenteral Rocephin at the office. The patient is a type II diabetic on insulin. She has peripheral neuropathy by history but does not have a history of PAD. She does describe pain in her legs when she but this is not exertional. ABIs in our clinic were noncompressible bilaterally. Other review of her chart in care everywhere reveals a history of mild leukocytosis with anemia and thrombocytopenia. She states the low platelets are unusual since she used to have high platelets. She has been referred to a hematologist Electronic Signature(s) Signed: 10/20/2016 4:47:30 PM By: Linton Ham  MD Entered By: Linton Ham on 10/20/2016 [place], Leggett. ([id number]) -------------------------------------------------------------------------------- Physical Exam Details Patient Name: Erin [name], [name] Date of Service: 10/20/2016 10:30 AM Medical Record Patient Account Number: [id number] Number: Treating RN: Ahmed Prima 09-Mar-1946 (71 y.o. Other Clinician: Date of Birth/Sex: Female) Treating ROBSON, MICHAEL [place], Provider/Extender: G Primary Care Provider: Kendrick Fries Referring Provider: Mackie Pai Weeks in Treatment: 0 Constitutional Patient is hypertensive.. Pulse regular and within target range for patient.Marland Kitchen Respirations regular, non-labored and within target range.. Temperature is normal and within the target range for the patient.Marland Kitchen appears in no distress. Eyes Conjunctivae clear. No discharge. No icterus. Respiratory Respiratory effort is easy and symmetric bilaterally. Rate is normal at rest and on room air.. Cardiovascular Heart rhythm and rate regular, without murmur or gallop.. Femoral arteries without bruits and pulses strong.. Pedal pulses palpable and strong bilaterally.. Edema present in both extremities. 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 This is equal bilaterally.. Gastrointestinal (GI) Abdomen is soft and non-distended without masses or tenderness. Bowel sounds active in all quadrants.. No liver or spleen enlargement or tenderness.. Lymphatic Nonpalpable in the popliteal or inguinal area. Integumentary (Hair, Skin) There is erythema around the wounds on the bilateral anterior legs however I suspect this is venous inflammation at least at present rather than cellulitis. Notes Wound exam; the patient has 2 wounds one on the right anterior lower leg and one on the left anterior lower leg. This is in the setting of what looks to be chronic venous insufficiency with inflammation and  secondary lymphedema Electronic Signature(s) Signed: 10/20/2016 4:47:30 PM By: Linton Ham MD Entered By: Linton Ham on 10/20/2016 12:48:37 Erin [name] ([id number]) -------------------------------------------------------------------------------- Physician Orders Details Patient Name: Erin [name], [name] Date of Service: 10/20/2016 10:30 AM Medical Record Patient Account Number: [id number] Number: Treating RN: Ahmed Prima 1945-05-25 (71 y.o. Other Clinician: Date of Birth/Sex: Female) Treating ROBSON, MICHAEL [place], Provider/Extender: G Primary Care Provider: Kendrick Fries Referring Provider: Mal Misty in Treatment: 0 Verbal / Phone Orders: Yes Clinician: Pinkerton, Debi Read Back and Verified: Yes Diagnosis Coding Wound Cleansing Wound #1 Left Lower Leg o Clean wound with Normal Saline. o Cleanse wound with mild soap and water Wound #2 Right,Anterior Lower Leg o Clean wound with Normal Saline. o Cleanse wound with mild soap and water Anesthetic Wound #1 Left Lower Leg o Topical Lidocaine 4% cream applied to wound bed prior to debridement Wound #2 Right,Anterior Lower Leg o Topical Lidocaine  4% cream applied to wound bed prior to debridement Skin Barriers/Peri-Wound Care Wound #1 Left Lower Leg o Barrier cream o Triamcinolone Acetonide Ointment Wound #2 Right,Anterior Lower Leg o Barrier cream o Triamcinolone Acetonide Ointment Primary Wound Dressing Wound #1 Left Lower Leg o Aquacel Ag Wound #2 Right,Anterior Lower Leg o Aquacel Ag Secondary Dressing RAELYNNE, LUDWICK B. ([id number]) Wound #1 Left Lower Leg o ABD pad o XtraSorb Wound #2 Right,Anterior Lower Leg o ABD pad o XtraSorb Dressing Change Frequency Wound #1 Left Lower Leg o Change dressing every week Wound #2 Right,Anterior Lower Leg o Change dressing every week Follow-up Appointments Wound #1 Left Lower Leg o Return  Appointment in 1 week. Wound #2 Right,Anterior Lower Leg o Return Appointment in 1 week. Edema Control Wound #1 Left Lower Leg o 3 Layer Compression System - Bilateral - unna to anchor o Elevate legs to the level of the heart and pump ankles as often as possible Wound #2 Right,Anterior Lower Leg o 3 Layer Compression System - Bilateral - unna to anchor o Elevate legs to the level of the heart and pump ankles as often as possible Additional Orders / Instructions Wound #1 Left Lower Leg o Increase protein intake. Wound #2 Right,Anterior Lower Leg o Increase protein intake. Medications-please add to medication list. Wound #1 Left Lower Leg o Other: - Vitamin C, Zinc, MVI Wound #2 Right,Anterior Lower Leg o Other: - Vitamin C, Zinc, MVI Services and Therapies o Venous Studies -Bilateral Erin [name], Erin [name] ([id number]) Electronic Signature(s) Signed: 10/20/2016 4:47:30 PM By: Linton Ham MD Signed: 10/20/2016 5:03:57 PM By: Alric Quan Entered By: Alric Quan on 10/20/2016 11:55:47 Erin [name], [name] ([id number]) -------------------------------------------------------------------------------- Problem List Details Patient Name: Erin [name], [name] Date of Service: 10/20/2016 10:30 AM Medical Record Patient Account Number: [id number] Number: Treating RN: Ahmed Prima 1946/04/05 (71 y.o. Other Clinician: Date of Birth/Sex: Female) Treating ROBSON, MICHAEL 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 Date of Service: 10/20/2016 10:30 AM Medical Record Patient Account Number: [id number] Number: Treating RN: Ahmed Prima 1945-11-08 (71 y.o. Other Clinician: Date of Birth/Sex: Female) Treating ROBSON, MICHAEL [place], Provider/Extender: G Primary Care Provider: Kendrick Fries Referring Provider: Mackie Pai Weeks in Treatment: 0 Subjective Chief Complaint Information obtained from Patient 10/20/16 patient is here for wounds on her bilateral anterior lower legs History of Present Illness (HPI) 10/20/16; this is a independent 72 year old woman who is a type II diabetic. She has had wounds on her right anterior to anterior legs for about a month. She thinks this was probably minor trauma although as opposed to prior wound she's had an these areas they have not closed over. She has been seeing her primary care physician for cellulitis I think around the right leg wound. She is been receiving doxycycline and I think also parenteral Rocephin at the office.  The patient is a type II diabetic on insulin. She has peripheral neuropathy by history but does not have a history of PAD. She does describe pain in her legs when she but this is not exertional. ABIs in our clinic were noncompressible bilaterally. Other review of her chart in care everywhere reveals a history of mild leukocytosis with anemia and thrombocytopenia. She states the low platelets are unusual since she used to have high  platelets. She has been referred to a hematologist Wound History Patient presents with 4 open wounds that have been present for approximately 1 month. Patient has been treating wounds in the following manner: alcohol swab. Laboratory tests have not been performed in the last month. Patient reportedly has not tested positive for an antibiotic resistant organism. Patient reportedly has not tested positive for osteomyelitis. Patient reportedly has not had testing performed to evaluate circulation in the legs. Patient experiences the following problems associated with their wounds: swelling. Patient History Information obtained from Patient. Allergies NKDA JAELIN, DEVINCENTIS ([id number]) Family History Cancer - Father, Paternal Grandparents, Heart Disease - Father, Hypertension - Mother, Kidney Disease - Father, No family history of Diabetes, Hereditary Spherocytosis, Lung Disease, Seizures, Stroke, Thyroid Problems, Tuberculosis. Social History Former smoker - quit age 47, Marital Status - Widowed, Alcohol Use - Never, Drug Use - No History, Caffeine Use - Daily. Medical History Eyes Patient has history of Cataracts - surgery Respiratory Patient has history of Asthma Cardiovascular Patient has history of Congestive Heart Failure, Hypertension Endocrine Patient has history of Type II Diabetes Musculoskeletal Patient has history of Gout, Osteoarthritis Neurologic Patient has history of Neuropathy Patient is treated with Insulin. Blood sugar is not tested. Review of Systems (ROS) Constitutional Symptoms (General Health) Complains or has symptoms of Chills. Ear/Nose/Mouth/Throat The patient has no complaints or symptoms. Hematologic/Lymphatic The patient has no complaints or symptoms. Cardiovascular hyperlipidemia Gastrointestinal gerd Genitourinary ckd Immunological The patient has no complaints or symptoms. Integumentary (Skin) The patient has no complaints or  symptoms. Oncologic The patient has no complaints or symptoms. Psychiatric depression ILITHYIA, TITZER ([id number]) Objective Constitutional Patient is hypertensive.. Pulse regular and within target range for patient.Marland Kitchen Respirations regular, non-labored and within target range.. Temperature is normal and within the target range for the patient.Marland Kitchen appears in no distress. Vitals Time Taken: 10:41 AM, Height: 64 in, Source: Stated, Weight: 260 lbs, Source: Measured, BMI: 44.6, Temperature: 97.6 F, Pulse: 86 bpm, Respiratory Rate: 18 breaths/min, Blood Pressure: 166/59 mmHg. General Notes: Made Dr. Dellia Nims aware of pts BP. Pt states that she has not taken her BP medication today but she is going to take it when she gets home. Eyes Conjunctivae clear. No discharge. No icterus. Respiratory Respiratory effort is easy and symmetric bilaterally. Rate is normal at rest and on room air.. Cardiovascular Heart rhythm and rate regular, without murmur or gallop.. Femoral arteries without bruits and pulses strong.. Pedal pulses palpable and strong bilaterally.. Edema present in both extremities. 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 This is equal bilaterally.. Gastrointestinal (GI) Abdomen is soft and non-distended without masses or tenderness. Bowel sounds active in all quadrants.. No liver or spleen enlargement or tenderness.. Lymphatic Nonpalpable in the popliteal or inguinal area. General Notes: Wound exam; the patient has 2 wounds one on the right anterior lower leg and one on the left anterior lower leg. This is in the setting of what looks to be chronic venous insufficiency with inflammation and secondary lymphedema Integumentary (Hair, Skin) There is erythema around the wounds on  the bilateral anterior legs however I suspect this is venous inflammation at least at present rather than cellulitis. Wound #1 status is Open. Original cause of wound was Trauma. The wound  is located on the Left Lower Leg. The wound measures 1cm length x 0.7cm width x 0.1cm depth; 0.55cm^2 area and 0.055cm^3 volume. There is no tunneling or undermining noted. There is a medium amount of serosanguineous drainage noted. The wound margin is thickened. There is no granulation within the wound bed. There is a Barile, Ariana B. ([id number]) large (67-100%) amount of necrotic tissue within the wound bed including Eschar. Periwound temperature was noted as No Abnormality. The periwound has tenderness on palpation. Wound #2 status is Open. Original cause of wound was Blister. The wound is located on the Right,Anterior Lower Leg. The wound measures 3.5cm length x 2cm width x 0.1cm depth; 5.498cm^2 area and 0.55cm^3 volume. There is no tunneling or undermining noted. There is a large amount of purulent drainage noted. The wound margin is distinct with the outline attached to the wound base. There is no granulation within the wound bed. There is a large (67-100%) amount of necrotic tissue within the wound bed including Eschar and Adherent Slough. Periwound temperature was noted as No Abnormality. The periwound has tenderness on palpation. 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 There was a Skin/Subcutaneous Tissue Debridement ([id number]) debridement with total area of 0.7 sq cm performed by Ricard Dillon, MD.  with the following instrument(s): Curette to remove Viable and Non-Viable tissue/material including Exudate, Fibrin/Slough, and Subcutaneous after achieving pain control using Lidocaine 4% Topical Solution. A time out was conducted at 11:46, prior to the start of the procedure. A Minimum amount of bleeding was controlled with Pressure. The procedure was tolerated well with a pain level of 0 throughout and a pain level of 0 following the procedure. Post Debridement Measurements: 0.7cm length x 0.6cm width x 0.1cm depth; 0.033cm^3 volume. Character of Wound/Ulcer Post Debridement requires further debridement. Severity of Tissue Post Debridement is: Fat layer exposed. Post procedure Diagnosis Wound #1: Same as Pre-Procedure Wound #2 Pre-procedure diagnosis of Wound #2 is a Diabetic Wound/Ulcer of the Lower Extremity located on the Right,Anterior Lower Leg .Severity of Tissue Pre Debridement is: Fat layer exposed. There was a LEXINE, JASPERS B. ([id number]) Skin/Subcutaneous Tissue Debridement [phone]) debridement with total area of 7 sq cm performed by Ricard Dillon, MD. with the following instrument(s): Curette to remove Viable and Non-Viable tissue/material including Exudate, Fibrin/Slough, and Subcutaneous after achieving pain control using Lidocaine 4% Topical Solution. A time out was conducted at 11:46, prior to the start of the procedure. A Minimum amount of bleeding was controlled with Pressure. The procedure was tolerated well with a pain level of 0 throughout and a pain level of 0 following the procedure. Post Debridement Measurements: 2cm length x 2.1cm width x 0.1cm depth; 0.33cm^3 volume. Character of Wound/Ulcer Post Debridement requires further debridement. Severity of Tissue Post Debridement is: Fat layer exposed. Post procedure Diagnosis  Wound #2: Same as Pre-Procedure Both of the wounds had thick black eschar with underlying nonviable subcutaneous tissue. Both  wounds were debridement aggressively with a #5 curet. Hemostasis with direct pressure. Both wounds appear to have a healthy granulated surface. No evidence of infection noted. No cultures were felt to be necessary Plan Wound Cleansing: Wound #1 Left Lower Leg: Clean wound with Normal Saline. Cleanse wound with mild soap and water Wound #2 Right,Anterior Lower Leg: Clean wound with Normal Saline. Cleanse wound with mild soap and water Anesthetic: Wound #1 Left Lower Leg: Topical Lidocaine 4% cream applied to wound bed prior to debridement Wound #2 Right,Anterior Lower Leg: Topical Lidocaine 4% cream applied to wound bed prior to debridement Skin Barriers/Peri-Wound Care: Wound #1 Left Lower Leg: Barrier cream Triamcinolone Acetonide Ointment Wound #2 Right,Anterior Lower Leg: Barrier cream Triamcinolone Acetonide Ointment Primary Wound Dressing: Wound #1 Left Lower Leg: Aquacel Ag Wound #2 Right,Anterior Lower Leg: Aquacel Ag Secondary Dressing: Wound #1 Left Lower Leg: ABD pad Diana, Mathew B. ([id number]) XtraSorb Wound #2 Right,Anterior Lower Leg: ABD pad XtraSorb Dressing Change Frequency: Wound #1 Left Lower Leg: Change dressing every week Wound #2 Right,Anterior Lower Leg: Change dressing every week Follow-up Appointments: Wound #1 Left Lower Leg: Return Appointment in 1 week. Wound #2 Right,Anterior Lower Leg: Return Appointment in 1 week. Edema Control: Wound #1 Left Lower Leg: 3 Layer Compression System - Bilateral - unna to anchor Elevate legs to the level of the heart and pump ankles as often as possible Wound #2 Right,Anterior Lower Leg: 3 Layer Compression System - Bilateral - unna to anchor Elevate legs to the level of the heart and pump ankles as often as possible Additional Orders / Instructions: Wound #1 Left Lower Leg: Increase protein intake. Wound #2 Right,Anterior Lower Leg: Increase protein intake. Medications-please add to medication  list.: Wound #1 Left Lower Leg: Other: - Vitamin C, Zinc, MVI Wound #2 Right,Anterior Lower Leg: Other: - Vitamin C, Zinc, MVI Services and Therapies ordered were: Venous Studies -Bilateral #1 we dressed the wounds with silver alginate and 3 layer compression #2 although her ABIs were noncompressible in the clinic bilaterally. Her peripheral pulses were normal capillary refill time was normal I thought we could do the 3 layer compression without incident #3 I did talk to the patient at some length about compression stockings. She is been prescribed these in the past but has not really worn them. #4 we will ordered bilateral reflux studies as I suspect this patient is going to have difficulties down the road. EUGENIA, ELDREDGE ([id number]) Electronic Signature(s) Signed: 10/20/2016 4:47:30 PM By: Linton Ham MD Entered By: Linton Ham on 10/20/2016 12:50:44 Erin [name] ([id number]) -------------------------------------------------------------------------------- ROS/PFSH Details Patient Name: ROYALE, LENNARTZ B. Date of Service: 10/20/2016 10:30 AM Medical Record Patient Account Number: [id number] Number: Treating RN: Ahmed Prima [birth year] (71 y.o. Other Clinician: Date of Birth/Sex: Female) Treating ROBSON, MICHAEL Barren, Provider/Extender: G Primary Care Provider: Kendrick Fries Referring Provider: Mackie Pai Weeks in Treatment: 0 Information Obtained From Patient Wound History Do you currently have one or more open woundso Yes How many open wounds do you currently haveo 4 Approximately how long have you had your woundso 1 month How have you been treating your wound(s) until nowo alcohol swab Has your wound(s) ever healed and then re-openedo No Have you had any lab work done in the past montho No Have you tested positive for an antibiotic resistant organism (MRSA, VRE)o No Have you tested positive for osteomyelitis (  bone infection)o No Have you  had any tests for circulation on your legso No Have you had other problems associated with your woundso Swelling Constitutional Symptoms (General Health) Complaints and Symptoms: Positive for: Chills Eyes Medical History: Positive for: Cataracts - surgery Ear/Nose/Mouth/Throat Complaints and Symptoms: No Complaints or Symptoms Hematologic/Lymphatic Complaints and Symptoms: No Complaints or Symptoms Respiratory Medical History: Positive for: Asthma Reisen, Sandee B. ([id number]) Cardiovascular Complaints and Symptoms: Review of System Notes: hyperlipidemia Medical History: Positive for: Congestive Heart Failure; Hypertension Gastrointestinal Complaints and Symptoms: Review of System Notes: gerd Endocrine Medical History: Positive for: Type II Diabetes Time with diabetes: 5 years Treated with: Insulin Blood sugar tested every day: No Genitourinary Complaints and Symptoms: Review of System Notes: ckd Immunological Complaints and Symptoms: No Complaints or Symptoms Integumentary (Skin) Complaints and Symptoms: No Complaints or Symptoms Musculoskeletal Medical History: Positive for: Gout; Osteoarthritis Neurologic Medical History: Positive for: Neuropathy Oncologic JANITZA, REVUELTA ([id number]) Complaints and Symptoms: No Complaints or Symptoms Psychiatric Complaints and Symptoms: Review of System Notes: depression HBO Extended History Items Eyes: Cataracts Immunizations Pneumococcal Vaccine: Received Pneumococcal Vaccination: Yes Family and Social History Cancer: Yes - Father, Paternal Grandparents; Diabetes: No; Heart Disease: Yes - Father; Hereditary Spherocytosis: No; Hypertension: Yes - Mother; Kidney Disease: Yes - Father; Lung Disease: No; Seizures: No; Stroke: No; Thyroid Problems: No; Tuberculosis: No; Former smoker - quit age 65; Marital Status - Widowed; Alcohol Use: Never; Drug Use: No History; Caffeine Use: Daily; Financial Concerns:  No; Food, Clothing or Shelter Needs: No; Support System Lacking: No; Advanced Directives: No; Patient does not want information on Advanced Directives; Do not resuscitate: No; Living Will: No; Medical Power of Attorney: No Electronic Signature(s) Signed: 10/20/2016 4:47:30 PM By: Linton Ham MD Signed: 10/20/2016 5:03:57 PM By: Alric Quan Entered By: Alric Quan on 10/20/2016 10:52:40 Erin [name] ([id number]) -------------------------------------------------------------------------------- SuperBill Details Patient Name: SIREK, Sarajean B. Date of Service: 10/20/2016 Medical Record Patient Account Number: [id number] Number: Treating RN: Ahmed Prima Dec 17, 1945 (71 y.o. 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 mellitus with diabetic polyneuropathy I89.0 Lymphedema, not elsewhere classified Facility Procedures CPT4: Description Modifier Quantity Code [id number] - WOUND CARE VISIT-LEV 4 EST PT 1 CPT4: [id number] 11042 - DEB SUBQ TISSUE 20 SQ CM/< 1 ICD-10 Description Diagnosis L97.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 Physician Procedures CPT4: Description Modifier Quantity Code [id number] - WC PHYS LEVEL 4 - NEW PT 25 1 ICD-10 Description Diagnosis I87.333 Chronic venous hypertension (idiopathic) with ulcer and inflammation of  bilateral lower extremity L97.811 Non-pressure chronic  ulcer of other part of right lower leg limited to breakdown of skin L97.221 Non-pressure chronic ulcer of left calf limited to breakdown of skin KARINNE, SCHMADER ([id number]) Electronic Signature(s) Signed: 10/20/2016 4:47:30 PM By: Linton Ham MD Signed: 10/20/2016 5:03:57 PM By: Alric Quan Entered By: Alric Quan on 10/20/2016 12:59:39

## 2016-10-25 ENCOUNTER — Other Ambulatory Visit: Payer: Self-pay | Admitting: *Deleted

## 2016-10-25 ENCOUNTER — Encounter: Payer: Self-pay | Admitting: *Deleted

## 2016-10-25 DIAGNOSIS — E785 Hyperlipidemia, unspecified: Secondary | ICD-10-CM

## 2016-10-25 DIAGNOSIS — I10 Essential (primary) hypertension: Secondary | ICD-10-CM

## 2016-10-25 MED ORDER — FENOFIBRATE 160 MG PO TABS: 160.0000 mg | ORAL_TABLET | Freq: Every day | ORAL | 2 refills | Status: DC

## 2016-10-26 ENCOUNTER — Other Ambulatory Visit: Payer: Self-pay | Admitting: Internal Medicine

## 2016-10-27 ENCOUNTER — Encounter: Payer: Medicare Other | Admitting: Internal Medicine

## 2016-10-27 DIAGNOSIS — L97811 Non-pressure chronic ulcer of other part of right lower leg limited to breakdown of skin: Secondary | ICD-10-CM | POA: Diagnosis not present

## 2016-10-27 DIAGNOSIS — L97812 Non-pressure chronic ulcer of other part of right lower leg with fat layer exposed: Secondary | ICD-10-CM | POA: Diagnosis not present

## 2016-10-27 DIAGNOSIS — L97221 Non-pressure chronic ulcer of left calf limited to breakdown of skin: Secondary | ICD-10-CM | POA: Diagnosis not present

## 2016-10-27 DIAGNOSIS — Z794 Long term (current) use of insulin: Secondary | ICD-10-CM | POA: Diagnosis not present

## 2016-10-27 DIAGNOSIS — E1142 Type 2 diabetes mellitus with diabetic polyneuropathy: Secondary | ICD-10-CM | POA: Diagnosis not present

## 2016-10-27 DIAGNOSIS — I87333 Chronic venous hypertension (idiopathic) with ulcer and inflammation of bilateral lower extremity: Secondary | ICD-10-CM | POA: Diagnosis not present

## 2016-10-27 DIAGNOSIS — I890 Lymphedema, not elsewhere classified: Secondary | ICD-10-CM | POA: Diagnosis not present

## 2016-10-27 DIAGNOSIS — E11622 Type 2 diabetes mellitus with other skin ulcer: Secondary | ICD-10-CM | POA: Diagnosis not present

## 2016-10-28 DIAGNOSIS — D509 Iron deficiency anemia, unspecified: Secondary | ICD-10-CM | POA: Diagnosis not present

## 2016-10-28 DIAGNOSIS — D72810 Lymphocytopenia: Secondary | ICD-10-CM | POA: Diagnosis not present

## 2016-10-28 DIAGNOSIS — D696 Thrombocytopenia, unspecified: Secondary | ICD-10-CM | POA: Diagnosis not present

## 2016-10-29 NOTE — Progress Notes (Signed)
Erin [name], Erin [name] ([id number]) Visit Report for 10/27/2016 Debridement Details Patient Name: Erin [name], Erin [name]. Date of Service: 10/27/2016 9:45 AM Medical Record Patient Account Number: [id number] Number: Treating RN: Ahmed Prima 01-11-1946 (71 y.o. Other Clinician: Date of Birth/Sex: Female) Treating Erin [name], Provider/Extender: G Primary Care Provider: Garnet Koyanagi [name], Referring Provider: Tora Kindred in Treatment: 1 Debridement Performed for Wound #2 Right,Anterior Lower Leg Assessment: Performed By: Physician Ricard Dillon, MD Debridement: Debridement Severity of Tissue Pre Fat layer exposed Debridement: Pre-procedure Verification/Time Out Yes - 10:27 Taken: Start Time: 10:28 Pain Control: Lidocaine 4% Topical Solution Level: Skin/Subcutaneous Tissue Total Area Debrided (L x 0.1 (cm) x 0.1 (cm) = 0.01 (cm) W): Tissue and other Viable, Non-Viable, Exudate, Fibrin/Slough, Subcutaneous material debrided: Instrument: Curette Bleeding: Minimum Hemostasis Achieved: Pressure End Time: 10:30 Procedural Pain: 0 Post Procedural Pain: 0 Response to Treatment: Procedure was tolerated well Post Debridement Measurements of Total Wound Length: (cm) 0.8 Width: (cm) 0.8 Depth: (cm) 0.1 Volume: (cm) 0.05 Character of Wound/Ulcer Post Requires Further Debridement Debridement: Severity of Tissue Post Debridement: Fat layer exposed Erin [name] ([id number]) Post Procedure Diagnosis Same as Pre-procedure Electronic Signature(s) Signed: 10/27/2016 5:54:59 PM By: Alric Quan Signed: 10/27/2016 6:25:53 PM By: Linton Ham MD Entered By: Linton Ham on 10/27/2016 10:37:02 Erin [name] ([id number]) -------------------------------------------------------------------------------- HPI Details Patient Name: [name], [name] Date of Service: 10/27/2016 9:45 AM Medical Record Patient Account Number:  [id number] Number: Treating RN: Ahmed Prima December 04, 1945 (71 y.o. Other Clinician: Date of Birth/Sex: Female) Treating Erin [name], Provider/Extender: G Primary Care Provider: Garnet Koyanagi [name], Referring Provider: Tora Kindred in Treatment: 1 History of Present Illness HPI Description: 10/20/16; this is a independent 71 year old woman who is a type II diabetic. She has had wounds on her right anterior to anterior legs for about a month. She thinks this was probably minor trauma although as opposed to prior wound she's had an these areas they have not closed over. She has been seeing her primary care physician for cellulitis I think around the right leg wound. She is been receiving doxycycline and I think also parenteral Rocephin at the office. The patient is a type II diabetic on insulin. She has peripheral neuropathy by history but does not have a history of PAD. She does describe pain in her legs when she but this is not exertional. ABIs in our clinic were noncompressible bilaterally. Other review of her chart in care everywhere reveals a history of mild leukocytosis with anemia and thrombocytopenia. She states the low platelets are unusual since she used to have high platelets. She has been referred to a hematologist 10/27/16; this is a patient with chronic venous insufficiency and venous inflammation. She has wounds on her right greater than left anterior legs in the setting of poorly controlled edema. She arrives today with concerns about the wraps we plan on wanting stockings etc. We went ahead and ordered her drugs to light stockings bilaterally however I've managed to convince her that she needs external compression before we can transition her into stockings Electronic Signature(s) Signed: 10/27/2016 6:25:53 PM By: Linton Ham MD Entered By: Linton Ham on 10/27/2016 10:37:58 Erin [name]  ([id number]) -------------------------------------------------------------------------------- Physical Exam Details Patient Name: [name], [name] Date of Service: 10/27/2016 9:45 AM Medical Record Patient Account Number: [id number] Number: Treating RN: Ahmed Prima 20-Nov-1945 (71 y.o. Other Clinician: Date of Birth/Sex: Female) Treating Erin [name], [place], Provider/Extender: G Primary Care Provider: Garnet Koyanagi [name], Referring Provider:  YVONNE [name] in Treatment: 1 Constitutional Sitting or standing Blood Pressure is within target range for patient.. Pulse regular and within target range for patient.Marland Kitchen Respirations regular, non-labored and within target range.. Temperature is normal and within the target range for the patient.Marland Kitchen appears in no distress. Eyes Conjunctivae clear. No discharge. Cardiovascular Pedal pulses palpable and strong bilaterally.. Edema present in both extremities. The biggest lady has chronic venous hypertension, inflammation and secondary lymphedema. Lymphatic Right popliteal and inguinal areas have no lymphadenopathy. Psychiatric No evidence of depression, anxiety, or agitation. Calm, cooperative, and communicative. Appropriate interactions and affect.. Notes When exam; the patient has 2 wounds on the right anterior lower leg. One is still open and covered by a thick surface eschar. Using a #5 curet I remove the eschar nonviable underlying subcutaneous tissue she has a small clean wound. oon the left anterior leg small wound viable surface looks like it is closing. oThe patient has surrounding erythema on the anterior part of both her legs however this is not physically tender although it is warm. I don't believe she has cellulitis bilaterally I think this is chronic venous inflammation Electronic Signature(s) Signed: 10/27/2016 6:25:53 PM By: Linton Ham MD Entered By: Linton Ham on 10/27/2016 10:40:39 Erin [name] ([id number]) -------------------------------------------------------------------------------- Physician Orders Details Patient Name: Erin [name], [name] Date of Service: 10/27/2016 9:45 AM Medical Record Patient Account Number: [id number] Number: Treating RN: Ahmed Prima [birth year] (71 y.o. Other Clinician: Date of Birth/Sex: Female) Treating Yaira Bernardi [place], Provider/Extender: G Primary Care Provider: Garnet Koyanagi [name], Referring Provider: Tora Kindred in Treatment: 1 Verbal / Phone Orders: Yes Clinician: Pinkerton, Debi Read Back and Verified: Yes Diagnosis Coding Wound Cleansing Wound #1 Left Lower Leg o Clean wound with Normal Saline. o Cleanse wound with mild soap and water Wound #2 Right,Anterior Lower Leg o Clean wound with Normal Saline. o Cleanse wound with mild soap and water Anesthetic Wound #1 Left Lower Leg o Topical Lidocaine 4% cream applied to wound bed prior to debridement Wound #2 Right,Anterior Lower Leg o Topical Lidocaine 4% cream applied to wound bed prior to debridement Skin Barriers/Peri-Wound Care Wound #1 Left Lower Leg o Barrier cream o Triamcinolone Acetonide Ointment Wound #2 Right,Anterior Lower Leg o Barrier cream o Triamcinolone Acetonide Ointment Primary Wound Dressing Wound #1 Left Lower Leg o Aquacel Ag Wound #2 Right,Anterior Lower Leg o Aquacel Ag Erin [name], Erin [name] Kitchen ([id number]) Secondary Dressing Wound #1 Left Lower Leg o ABD pad Wound #2 Right,Anterior Lower Leg o ABD pad Dressing Change Frequency Wound #1 Left Lower Leg o Change dressing every week Wound #2 Right,Anterior Lower Leg o Change dressing every week Follow-up Appointments Wound #1 Left Lower Leg o Return Appointment in 1 week. Wound #2 Right,Anterior Lower Leg o Return Appointment in 1 week. Edema Control Wound #1 Left Lower Leg o 3 Layer Compression System - Bilateral - unna to anchor o  Elevate legs to the level of the heart and pump ankles as often as possible Wound #2 Right,Anterior Lower Leg o 3 Layer Compression System - Bilateral - unna to anchor o Elevate legs to the level of the heart and pump ankles as often as possible Additional Orders / Instructions Wound #1 Left Lower Leg o Increase protein intake. Wound #2 Right,Anterior Lower Leg o Increase protein intake. Medications-please add to medication list. Wound #1 Left Lower Leg o Other: - Vitamin C, Zinc, MVI Wound #2 Right,Anterior Lower Leg o Other: - Vitamin C, Zinc, MVI Electronic Signature(s) Signed: 10/27/2016 5:54:59  PM By: Earney Navy, Felizardo Hoffmann ([id number]) Signed: 10/27/2016 6:25:53 PM By: Linton Ham MD Entered By: Alric Quan on 10/27/2016 10:31:27 Erin [name] ([id number]) -------------------------------------------------------------------------------- Problem List Details Patient Name: Erin [name], [name] Date of Service: 10/27/2016 9:45 AM Medical Record Patient Account Number: [id number] Number: Treating RN: Ahmed Prima Mar 27, 1946 (71 y.o. 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 Date of Service: 10/27/2016 9:45 AM Medical Record Patient Account Number: [id number] Number: Treating RN: Ahmed Prima Jan 22, 1946 (71 y.o. Other Clinician: Date of Birth/Sex: Female) Treating Arantza Darrington [place], Provider/Extender: G Primary Care Provider: Garnet Koyanagi [name], Referring Provider: Tora Kindred in Treatment: 1 Subjective History of Present Illness (HPI) 10/20/16; this is a independent 71 year old woman who is a type II diabetic. She has had wounds on her right anterior to anterior legs for about a month. She thinks this was probably minor trauma although as opposed to prior wound she's had an these areas they have not closed over. She has been seeing her primary care physician for cellulitis I think around the right leg wound. She is been receiving doxycycline and I think also parenteral Rocephin at the office. The patient is a type II diabetic on insulin. She has peripheral neuropathy by history but does not have a history of PAD. She does describe pain in her legs when she but this is not exertional. ABIs in our clinic were noncompressible bilaterally. Other review of her chart in care everywhere reveals a history of mild leukocytosis with anemia and thrombocytopenia. She states the low platelets are unusual since she used to have high platelets. She has been referred to a hematologist 10/27/16; this is a patient with chronic venous insufficiency and venous inflammation. She has wounds on her right greater than left anterior legs in the setting of poorly controlled edema. She arrives today with concerns about the wraps we plan on wanting stockings etc. We went ahead and ordered her drugs to  light stockings bilaterally however I've managed to convince her that she needs external compression before we can transition her into stockings Objective Constitutional Sitting or standing Blood Pressure is within target range for patient.. Pulse regular and within target range for patient.Marland Kitchen Respirations regular, non-labored and within target range.. Temperature is normal and within the target range for the patient.Marland Kitchen appears in no distress. Erin [name], Erin [name] ([id number]) Vitals Time Taken: 9:47 AM, Height: 64 in, Weight: 260 lbs, BMI: 44.6, Temperature: 97.6 F, Pulse: 90 bpm, Respiratory Rate: 18 breaths/min, Blood Pressure: 144/61 mmHg. Eyes Conjunctivae clear. No discharge. Cardiovascular Pedal pulses palpable and strong bilaterally.. Edema present in both extremities. The biggest lady has chronic venous hypertension, inflammation and secondary lymphedema. Lymphatic Right popliteal and inguinal areas have no lymphadenopathy. Psychiatric No evidence of depression, anxiety,  or agitation. Calm, cooperative, and communicative. Appropriate interactions and affect.. General Notes: When exam; the patient has 2 wounds on the right anterior lower leg. One is still open and covered by a thick surface eschar. Using a #5 curet I remove the eschar nonviable underlying subcutaneous tissue she has a small clean wound. on the left anterior leg small wound viable surface looks like it is closing. The patient has surrounding erythema on the anterior part of both her legs however this is not physically tender although it is warm. I don't believe she has cellulitis bilaterally I think this is chronic venous inflammation Integumentary (Hair, Skin) Wound #1 status is Open. Original cause of wound was Trauma. The wound is located on the Left Lower Leg. The wound measures 0.2cm length x 0.1cm width x 0.1cm depth; 0.016cm^2 area and 0.002cm^3 volume. There is no tunneling or undermining noted. There is a  medium amount of serous drainage noted. The wound margin is thickened. There is large (67-100%) red granulation within the wound bed. There is no necrotic tissue within the wound bed. The periwound skin appearance exhibited: Erythema. The surrounding wound skin color is noted with erythema which is circumferential. Periwound temperature was noted as No Abnormality. The periwound has tenderness on palpation. Wound #2 status is Open. Original cause of wound was Blister. The wound is located on the Right,Anterior Lower Leg. The wound measures 0.1cm length x 0.1cm width x 0.1cm depth; 0.008cm^2 area and 0.001cm^3 volume. There is no tunneling or undermining noted. There is a large amount of serosanguineous drainage noted. The wound margin is distinct with the outline attached to the wound base. There is no granulation within the wound bed. There is a large (67-100%) amount of necrotic tissue within the wound bed including Eschar and Adherent Slough. Periwound temperature was noted as No Abnormality. The periwound has tenderness on palpation. 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 There was a Skin/Subcutaneous Tissue Debridement ([id number]) debridement with total area of 0.01 sq cm performed by Ricard Dillon, MD. with the following instrument(s): Curette to remove Viable  and Non-Viable tissue/material including Exudate, Fibrin/Slough, and Subcutaneous after achieving pain control using Lidocaine 4% Topical Solution. A time out was conducted at 10:27, prior to the start of the procedure. A Minimum amount of bleeding was controlled with Pressure. The procedure was tolerated well with a pain level of 0 throughout and a pain level of 0 following the procedure. Post Debridement Measurements: 0.8cm length x 0.8cm width x 0.1cm depth; 0.05cm^3 volume. Character of Wound/Ulcer Post Debridement requires further debridement. Severity of Tissue Post Debridement is: Fat layer exposed. Post procedure Diagnosis Wound #2: Same as Pre-Procedure Plan Wound Cleansing: Wound #1 Left Lower Leg: Clean wound with Normal Saline. Cleanse wound with mild soap and water Wound #2 Right,Anterior Lower Leg: Clean wound with Normal Saline. Cleanse wound with mild soap and water Anesthetic: Wound #1 Left Lower Leg: Topical Lidocaine 4% cream applied to wound bed prior to  debridement Wound #2 Right,Anterior Lower Leg: Topical Lidocaine 4% cream applied to wound bed prior to debridement Skin Barriers/Peri-Wound Care: Erin [name], Erin [name] ([id number]) Wound #1 Left Lower Leg: Barrier cream Triamcinolone Acetonide Ointment Wound #2 Right,Anterior Lower Leg: Barrier cream Triamcinolone Acetonide Ointment Primary Wound Dressing: Wound #1 Left Lower Leg: Aquacel Ag Wound #2 Right,Anterior Lower Leg: Aquacel Ag Secondary Dressing: Wound #1 Left Lower Leg: ABD pad Wound #2 Right,Anterior Lower Leg: ABD pad Dressing Change Frequency: Wound #1 Left Lower Leg: Change dressing every week Wound #2 Right,Anterior Lower Leg: Change dressing every week Follow-up Appointments: Wound #1 Left Lower Leg: Return Appointment in 1 week. Wound #2 Right,Anterior Lower Leg: Return Appointment in 1 week. Edema Control: Wound #1 Left Lower Leg: 3 Layer Compression System - Bilateral - unna to  anchor Elevate legs to the level of the heart and pump ankles as often as possible Wound #2 Right,Anterior Lower Leg: 3 Layer Compression System - Bilateral - unna to anchor Elevate legs to the level of the heart and pump ankles as often as possible Additional Orders / Instructions: Wound #1 Left Lower Leg: Increase protein intake. Wound #2 Right,Anterior Lower Leg: Increase protein intake. Medications-please add to medication list.: Wound #1 Left Lower Leg: Other: - Vitamin C, Zinc, MVI Wound #2 Right,Anterior Lower Leg: Other: - Vitamin C, Zinc, MVI Zirkelbach, Erin [name] B. ([id number]) o #1 I convinced her to continue to allow compression #2 continue silver alginate to both wound areas bilaterally/bilaterally ABDs and continued compression #3 juxta light stockings have been ordered bilaterally. I've asked her to bring these next week if they are available. If her wounds are closed I think we can transition her into the juxta light stockings #4 the patient has significant secondary lymphedema and if we cannot maintain skin integrity was stockings she would be a candidate for an external compression pump Electronic Signature(s) Signed: 10/27/2016 6:25:53 PM By: Linton Ham MD Entered By: Linton Ham on 10/27/2016 10:43:02 Erin [name] ([id number]) -------------------------------------------------------------------------------- SuperBill Details Patient Name: [name] Date of Service: 10/27/2016 Medical Record Patient Account Number: [id number] Number: Treating RN: Ahmed Prima 11/19/1945 (71 y.o. 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 E11.42 Type 2 diabetes mellitus with diabetic polyneuropathy I89.0 Lymphedema, not elsewhere classified Facility Procedures CPT4: Description Modifier Quantity Code [id number] 11042 - DEB SUBQ TISSUE 20 SQ CM/< 1 ICD-10 Description Diagnosis L97.811 Non-pressure chronic ulcer of other part of right lower leg limited to breakdown of skin Physician Procedures CPT4: Description Modifier Quantity Code [id number] 06237 - WC PHYS SUBQ TISS 20 SQ CM 1 ICD-10 Description Diagnosis L97.811 Non-pressure chronic ulcer of other part of right lower leg limited to breakdown of skin Electronic Signature(s) Signed: 10/27/2016 6:25:53 PM By: Linton Ham MD Entered By: Linton Ham on 10/27/2016 10:43:33

## 2016-10-29 NOTE — Progress Notes (Signed)
DRESDEN, AMENT ([id number]) Visit Report for 10/27/2016 Arrival Information Details Patient Name: Erin [name], Erin [name]. Date of Service: 10/27/2016 9:45 AM Medical Record Patient Account Number: [id number] Number: Treating RN: Ahmed Prima Date of Birth/Sex: 02/09/1946 (71 y.o. Female) Other Clinician: Carollee Herter, Treating Dellia Nims [place] [facility] Maikayla Beggs: Kendrick Fries Santina Trillo/Extender: Teena Irani, Referring Logon Uttech: Tora Kindred in Treatment: 1 Visit Information History Since Last Visit All ordered tests and consults were completed: No Patient Arrived: Wheel Chair Added or deleted any medications: No Arrival Time: 09:41 Any new allergies or adverse reactions: No Accompanied By: sister Had a fall or experienced change in No Transfer Assistance: EasyPivot Patient activities of daily living that may affect Lift risk of falls: Patient Identification Verified: Yes Signs or symptoms of abuse/neglect since last No Secondary Verification Process Yes visito Completed: Hospitalized since last visit: No Patient Requires Transmission- No Has Dressing in Place as Prescribed: Yes Based Precautions: Has Compression in Place as Prescribed: Yes Patient Has Alerts: Yes Pain Present Now: No Patient Alerts: DM II L ABI non- compressible R ABI non- compressible Electronic Signature(s) Signed: 10/27/2016 5:54:59 PM By: Alric Quan Entered By: Alric Quan on 10/27/2016 09:47:01 [name] ([id number]) -------------------------------------------------------------------------------- Encounter Discharge Information Details Patient Name: Erin [name], [name] Date of Service: 10/27/2016 9:45 AM Medical Record Patient Account Number: [id number] Number: Treating RN: Ahmed Prima Date of Birth/Sex: 1946/01/23 (71 y.o. Female) Other Clinician: Carollee Herter, Treating Linton Ham Primary Care Ayren Zumbro: Kendrick Fries Pretty Weltman/Extender: Teena Irani, Referring Jeffifer Rabold: Tora Kindred in Treatment: 1 Encounter Discharge Information Items Discharge Pain Level: 0 Discharge Condition: Stable Ambulatory Status: Wheelchair Discharge Destination: Home Transportation: Private Auto Accompanied By: sister Schedule Follow-up Appointment: Yes Medication Reconciliation completed and provided to Patient/Care No Armani Gawlik: Provided on Clinical Summary of Care: 10/27/2016 Form Type Recipient Paper Patient BW Electronic Signature(s) Signed: 10/27/2016 5:54:59 PM By: Alric Quan Previous Signature: 10/27/2016 10:48:51 AM Version By: Ruthine Dose Entered By: Alric Quan on 10/27/2016 10:59:56 [name] ([id number]) -------------------------------------------------------------------------------- Lower Extremity Assessment Details Patient Name: Erin [name], [name] Date of Service: 10/27/2016 9:45 AM Medical Record Patient Account Number: [id number] Number: Treating RN: Ahmed Prima Date of Birth/Sex: 1946-03-26 (71 y.o. Female) Other Clinician: Carollee Herter, Treating ROBSON, [place] [facility] Lekendrick Alpern: Kendrick Fries Shellia Hartl/Extender: Teena Irani, Referring Dejah Droessler: Tora Kindred in Treatment: 1 Edema Assessment Assessed: [Left: No] [Right: No] Edema: [Left: Yes] [Right: Yes] Calf Left: Right: Point of Measurement: 30 cm From Medial Instep 50.5 cm 49.5 cm Ankle Left: Right: Point of Measurement: 9 cm From Medial Instep 34.9 cm 32.5 cm Vascular Assessment Pulses: Dorsalis Pedis Palpable: [Left:Yes] [Right:Yes] Posterior Tibial Extremity colors, hair growth, and conditions: Extremity Color: [Left:Hyperpigmented] [Right:Hyperpigmented] Temperature of Extremity: [Left:Warm] [Right:Warm] Capillary Refill: [Left:< 3 seconds] [Right:< 3 seconds] Toe Nail Assessment Left: Right: Thick: Yes Yes Discolored: No No Deformed: No No Improper Length and Hygiene: No No Notes heel to knee- 45cm Electronic  Signature(s) Signed: 10/27/2016 5:54:59 PM By: Earney Navy, Ellie BMarland Kitchen ([id number]) Entered By: Alric Quan on 10/27/2016 10:15:12 [name] ([id number]) -------------------------------------------------------------------------------- Multi Wound Chart Details Patient Name: Erin [name], [name] Date of Service: 10/27/2016 9:45 AM Medical Record Patient Account Number: [id number] Number: Treating RN: Ahmed Prima Date of Birth/Sex: Oct 20, 1945 (71 y.o. 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] [2:the Lower Extremity] Secondary Etiology: [1:Trauma, Other] [2:N/A] [N/A:N/A] Comorbid History: [1:Cataracts, Asthma, Congestive Heart Failure, Congestive Heart Failure, Hypertension, Type II Diabetes, Gout, Osteoarthritis, Neuropathy Osteoarthritis, Neuropathy] [2:Cataracts, Asthma, Hypertension, Type II Diabetes, Gout,]  [N/A:N/A] Date Acquired: [1:09/20/2016] [2:09/20/2016] [N/A:N/A] Weeks of Treatment: [1:1] [2:1] [N/A:N/A] Wound Status: [1:Open] [2:Open] [N/A:N/A] Measurements L x W x D 0.2x0.1x0.1 [2:0.1x0.1x0.1] [N/A:N/A] (cm) Area (cm) : [1:0.016] [2:0.008] [N/A:N/A] Volume (cm) : [1:0.002] [2:0.001] [N/A:N/A] % Reduction in Area: [1:97.10%] [2:99.90%] [N/A:N/A] % Reduction in Volume: 96.40% [2:99.80%] [N/A:N/A] Classification: [1:Grade 1] [2:Grade 1] [N/A:N/A] Exudate Amount:  [1:Medium] [2:Large] [N/A:N/A] Exudate Type: [1:Serous] [2:Serosanguineous] [N/A:N/A] Exudate Color: [1:amber] [2:red, brown] [N/A:N/A] Wound Margin: [1:Thickened] [2:Distinct, outline attached N/A] Granulation Amount: [1:Large (67-100%)] [2:None Present (0%)] [N/A:N/A] Granulation Quality: [1:Red] [2:N/A] [N/A:N/A] Necrotic Amount: None Present (0%) Large (67-100%) N/A Necrotic Tissue: N/A Eschar, Adherent Slough N/A Epithelialization: Large (67-100%) None N/A Periwound Skin Texture: No Abnormalities Noted No Abnormalities Noted N/A Periwound Skin No Abnormalities Noted No Abnormalities Noted N/A Moisture: Periwound Skin Color: Erythema: Yes No Abnormalities Noted N/A Erythema Location: Circumferential N/A N/A Temperature: No Abnormality No Abnormality N/A Tenderness on Yes Yes N/A Palpation: Wound Preparation: Ulcer Cleansing: Ulcer Cleansing: N/A Rinsed/Irrigated with Rinsed/Irrigated with Saline Saline Topical Anesthetic Topical Anesthetic Applied: Other: lidocaine Applied: Other: lidocaine 4% 4% Treatment Notes Electronic Signature(s) Signed: 10/27/2016 5:54:59 PM By: Alric Quan Entered By: Alric Quan on 10/27/2016 10:06:03 [name] ([id number]) -------------------------------------------------------------------------------- [place] Details Patient Name: Erin [name], [name] Date of Service: 10/27/2016 9:45 AM Medical Record Patient Account Number: [id number] Number: Treating RN: Ahmed Prima Date of Birth/Sex: December 25, 1945 (71 y.o. Female) Other Clinician: Carollee Herter, Treating Dellia Nims [place] [facility] Majesta Leichter: Kendrick Fries Talayah Picardi/Extender: Teena Irani, Referring Anshul Meddings: Tora Kindred in Treatment: 1 Active Inactive ` Abuse / Safety / Falls / Self Care Management Nursing Diagnoses: Potential for falls Goals: Patient will not experience any injury related to falls Date Initiated: 10/20/2016 Target Resolution  Date: 02/12/2017 Goal Status: Active Interventions: Assess impairment of mobility on admission and as needed per policy Notes: ` Nutrition Nursing Diagnoses: Imbalanced nutrition Impaired glucose control: actual or potential Potential for alteratiion in Nutrition/Potential for imbalanced nutrition Goals: Patient/caregiver will maintain therapeutic glucose control Date Initiated: 10/20/2016 Target Resolution Date: 02/12/2017 Goal Status: Active Interventions: Assess patient nutrition upon admission and as needed per policy Notes: QUANTISHA, MARSICANO ([id number]) Orientation to the Wound Care Program Nursing Diagnoses: Knowledge deficit related to the wound healing center program Goals: Patient/caregiver will verbalize understanding of the [place] Date Initiated: 10/20/2016 Target Resolution Date: 11/06/2016 Goal Status: Active Interventions: Provide education on orientation to the wound center Notes: ` Pain, Acute or Chronic Nursing Diagnoses: Pain, acute or chronic: actual or potential Potential alteration in comfort, pain Goals: Patient/caregiver will verbalize adequate pain control between visits Date Initiated: 10/20/2016 Target Resolution Date: 02/12/2017 Goal Status: Active Interventions: Complete pain assessment as per visit requirements Notes: ` Wound/Skin Impairment Nursing Diagnoses: Impaired tissue integrity Knowledge deficit related to ulceration/compromised skin integrity Goals: Ulcer/skin breakdown will have a volume reduction of 80% by week 12 Date Initiated: 10/20/2016 Target Resolution Date: 02/05/2017 Goal Status: Active Interventions: Assess patient/caregiver ability to perform ulcer/skin care regimen upon admission and as needed Assess ulceration(s) every visit GIANELLA, CHISMAR ([id number]) Notes:  Electronic Signature(s) Signed: 10/27/2016 5:54:59 PM By: Alric Quan Entered By: Alric Quan on 10/27/2016  10:05:55 [name] ([id number]) -------------------------------------------------------------------------------- Pain Assessment Details Patient Name: SHAUNTAE, REITMAN B. Date of Service: 10/27/2016 9:45 AM Medical Record Patient Account Number: [id number] Number: Treating RN: Ahmed Prima Date of Birth/Sex: [birth year] (71 y.o. Female) Other Clinician: Carollee Herter, Treating Linton Ham Primary Care Kateryna Grantham: Kendrick Fries Trenae Brunke/Extender: Teena Irani, Referring Gurman Ashland: Tora Kindred in Treatment: 1 Active Problems Location of Pain Severity and Description of Pain Patient Has Paino No Site Locations With Dressing Change: No Pain Management and Medication Current Pain Management: Electronic Signature(s) Signed: 10/27/2016 5:54:59 PM By: Alric Quan Entered By: Alric Quan on 10/27/2016 09:47:19 [name] ([id number]) -------------------------------------------------------------------------------- Patient/Caregiver Education Details Patient Name: Erin Mech B. Date of Service: 10/27/2016 9:45 AM Medical Record Patient Account Number: [id number] Number: Treating RN: Ahmed Prima October 12, 1945 (71 y.o. Other Clinician: Date of Birth/Gender: Female) Treating ROBSON, MICHAEL [place], Physician/Extender: G Primary Care Physician: Tora Kindred in Treatment: [place], Referring Physician: [facility] Education Assessment Education Provided To: Patient Education Topics Provided Wound/Skin Impairment: Handouts: Other: do not get wraps wet Methods: Demonstration, Explain/Verbal Responses: State content correctly Electronic Signature(s) Signed: 10/27/2016 5:54:59 PM By: Alric Quan Entered By: Alric Quan on 10/27/2016 11:00:10 [name] ([id number]) -------------------------------------------------------------------------------- Wound Assessment Details Patient Name: Erin [name], [name] Date of  Service: 10/27/2016 9:45 AM Medical Record Patient Account Number: [id number] Number: Treating RN: Ahmed Prima Date of Birth/Sex: Aug 08, 1945 (71 y.o. Female) Other Clinician: Carollee Herter, Treating ROBSON, [place] Primary Care Yvonne Petite: Kendrick Fries Phuong Hillary/Extender: Teena Irani, Referring Ellory Khurana: Tora Kindred in Treatment: 1 Wound Status Wound Number: 1 Primary Diabetic Wound/Ulcer of the Lower Etiology: Extremity Wound Location: Left Lower Leg Secondary Trauma, Other Wounding Event: Trauma Etiology: Date Acquired: 09/20/2016 Wound Open Weeks Of Treatment: 1 Status: Clustered Wound: No Comorbid Cataracts, Asthma, Congestive Heart History: Failure, Hypertension, Type II Diabetes, Gout, Osteoarthritis, Neuropathy Photos Photo Uploaded By: Alric Quan on 10/27/2016 13:26:49 Wound Measurements Length: (cm) 0.2 Width: (cm) 0.1 Depth: (cm) 0.1 Area: (cm) 0.016 Volume: (cm) 0.002 % Reduction in Area: 97.1% % Reduction in Volume: 96.4% Epithelialization: Large (67-100%) Tunneling: No Undermining: No Wound Description Classification: Grade 1 Wound Margin: Thickened Exudate Amount: Medium Exudate Type: Serous Exudate Color: amber Erin [name], [name] ([id number]) Foul Odor After Cleansing: No Slough/Fibrino No Wound Bed Granulation Amount: Large (67-100%) Granulation Quality: Red Necrotic Amount: None Present (0%) Periwound Skin Texture Texture Color No Abnormalities Noted: No No Abnormalities Noted: No Erythema: Yes Moisture Erythema Location: Circumferential No Abnormalities Noted: No Temperature / Pain Temperature: No Abnormality Tenderness on Palpation: Yes Wound Preparation Ulcer Cleansing: Rinsed/Irrigated with Saline Topical Anesthetic Applied: Other: lidocaine 4%, Treatment Notes Wound #1 (Left Lower Leg) 1. Cleansed with: Clean wound with Normal Saline Cleanse wound with antibacterial soap and water 2. Anesthetic Topical  Lidocaine 4% cream to wound bed prior to debridement 3. Peri-wound Care: Barrier cream Other peri-wound care (specify in notes) 4. Dressing Applied: Aquacel Ag 5. Secondary Dressing Applied ABD Pad 7. Secured with Tape 3 Layer Compression System - Bilateral Notes unna to anchor Electronic Signature(s) Signed: 10/27/2016 5:54:59 PM By: Alric Quan Entered By: Alric Quan on 10/27/2016 10:05:07 [name] ([id number]) -------------------------------------------------------------------------------- Wound Assessment Details Patient Name: Erin [name], [name] Date of Service: 10/27/2016 9:45 AM Medical Record Patient Account Number: [id number] Number: Treating RN: Ahmed Prima Date of Birth/Sex: 26-Feb-1946 (71 y.o. Female) Other Clinician: Carollee Herter, Treating Linton Ham Primary Care Jazlin Tapscott:  Kendrick Fries Layken Doenges/Extender: Teena Irani, Referring Dua Mehler: Tora Kindred in Treatment: 1 Wound Status Wound Number: 2 Primary Diabetic Wound/Ulcer of the Lower Etiology: Extremity Wound Location: Right Lower Leg - Anterior Wound Open Wounding Event: Blister Status: Date Acquired: 09/20/2016 Comorbid Cataracts, Asthma, Congestive Heart Weeks Of Treatment: 1 History: Failure, Hypertension, Type II Diabetes, Clustered Wound: No Gout, Osteoarthritis, Neuropathy Photos Photo Uploaded By: Alric Quan on 10/27/2016 13:27:17 Wound Measurements Length: (cm) 0.1 Width: (cm) 0.1 Depth: (cm) 0.1 Area: (cm) 0.008 Volume: (cm) 0.001 % Reduction in Area: 99.9% % Reduction in Volume: 99.8% Epithelialization: None Tunneling: No Undermining: No Wound Description Classification: Grade 1 Foul Odor Afte Wound Margin: Distinct, outline attached Slough/Fibrino Exudate Amount: Large Exudate Type: Serosanguineous Exudate Color: red, brown r Cleansing: No Yes Wound Bed Granulation Amount: None Present (0%) Erin [name], [name] ([id number]) Necrotic  Amount: Large (67-100%) Necrotic Quality: Eschar, Adherent Slough Periwound Skin Texture Texture Color No Abnormalities Noted: No No Abnormalities Noted: No Moisture Temperature / Pain No Abnormalities Noted: No Temperature: No Abnormality Tenderness on Palpation: Yes Wound Preparation Ulcer Cleansing: Rinsed/Irrigated with Saline Topical Anesthetic Applied: Other: lidocaine 4%, Treatment Notes Wound #2 (Right, Anterior Lower Leg) 1. Cleansed with: Clean wound with Normal Saline Cleanse wound with antibacterial soap and water 2. Anesthetic Topical Lidocaine 4% cream to wound bed prior to debridement 3. Peri-wound Care: Barrier cream Other peri-wound care (specify in notes) 4. Dressing Applied: Aquacel Ag 5. Secondary Dressing Applied ABD Pad 7. Secured with Tape 3 Layer Compression System - Bilateral Notes unna to anchor Electronic Signature(s) Signed: 10/27/2016 5:54:59 PM By: Alric Quan Entered By: Alric Quan on 10/27/2016 10:05:29 [name] ([id number]) -------------------------------------------------------------------------------- Vitals Details Patient Name: Erin [name], [name] Date of Service: 10/27/2016 9:45 AM Medical Record Patient Account Number: [id number] Number: Treating RN: Ahmed Prima Date of Birth/Sex: 1945-08-24 (71 y.o. Female) Other Clinician: Carollee Herter, Treating ROBSON, [place] Primary Care Lavera Vandermeer: Kendrick Fries Esaias Cleavenger/Extender: Teena Irani, Referring Courtenay Creger: Tora Kindred in Treatment: 1 Vital Signs Time Taken: 09:47 Temperature (F): 97.6 Height (in): 64 Pulse (bpm): 90 Weight (lbs): 260 Respiratory Rate (breaths/min): 18 Body Mass Index (BMI): 44.6 Blood Pressure (mmHg): 144/61 Reference Range: 80 - 120 mg / dl Electronic Signature(s) Signed: 10/27/2016 5:54:59 PM By: Alric Quan Entered By: Alric Quan on 10/27/2016 09:48:15

## 2016-11-02 ENCOUNTER — Other Ambulatory Visit: Payer: Self-pay | Admitting: Medical

## 2016-11-03 ENCOUNTER — Encounter: Payer: Medicare Other | Attending: Internal Medicine | Admitting: Internal Medicine

## 2016-11-03 DIAGNOSIS — Z794 Long term (current) use of insulin: Secondary | ICD-10-CM | POA: Diagnosis not present

## 2016-11-03 DIAGNOSIS — E1142 Type 2 diabetes mellitus with diabetic polyneuropathy: Secondary | ICD-10-CM | POA: Diagnosis not present

## 2016-11-03 DIAGNOSIS — I87333 Chronic venous hypertension (idiopathic) with ulcer and inflammation of bilateral lower extremity: Secondary | ICD-10-CM | POA: Diagnosis not present

## 2016-11-03 DIAGNOSIS — I509 Heart failure, unspecified: Secondary | ICD-10-CM | POA: Diagnosis not present

## 2016-11-03 DIAGNOSIS — L97221 Non-pressure chronic ulcer of left calf limited to breakdown of skin: Secondary | ICD-10-CM | POA: Diagnosis not present

## 2016-11-03 DIAGNOSIS — I890 Lymphedema, not elsewhere classified: Secondary | ICD-10-CM | POA: Insufficient documentation

## 2016-11-03 DIAGNOSIS — M109 Gout, unspecified: Secondary | ICD-10-CM | POA: Insufficient documentation

## 2016-11-03 DIAGNOSIS — I130 Hypertensive heart and chronic kidney disease with heart failure and stage 1 through stage 4 chronic kidney disease, or unspecified chronic kidney disease: Secondary | ICD-10-CM | POA: Insufficient documentation

## 2016-11-03 DIAGNOSIS — E1122 Type 2 diabetes mellitus with diabetic chronic kidney disease: Secondary | ICD-10-CM | POA: Insufficient documentation

## 2016-11-03 DIAGNOSIS — L97811 Non-pressure chronic ulcer of other part of right lower leg limited to breakdown of skin: Secondary | ICD-10-CM | POA: Diagnosis not present

## 2016-11-03 DIAGNOSIS — Z87891 Personal history of nicotine dependence: Secondary | ICD-10-CM | POA: Insufficient documentation

## 2016-11-03 DIAGNOSIS — S81801A Unspecified open wound, right lower leg, initial encounter: Secondary | ICD-10-CM | POA: Diagnosis not present

## 2016-11-03 DIAGNOSIS — N189 Chronic kidney disease, unspecified: Secondary | ICD-10-CM | POA: Insufficient documentation

## 2016-11-03 DIAGNOSIS — J45909 Unspecified asthma, uncomplicated: Secondary | ICD-10-CM | POA: Insufficient documentation

## 2016-11-03 DIAGNOSIS — M1990 Unspecified osteoarthritis, unspecified site: Secondary | ICD-10-CM | POA: Insufficient documentation

## 2016-11-03 DIAGNOSIS — S81802D Unspecified open wound, left lower leg, subsequent encounter: Secondary | ICD-10-CM | POA: Diagnosis not present

## 2016-11-05 NOTE — Progress Notes (Signed)
Erin [name] ([id number]) Visit Report for 11/03/2016 HPI Details Patient Name: Erin [name], Erin [name]. Date of Service: 11/03/2016 10:15 AM Medical Record Patient Account Number: [id number] Number: Treating RN: Erin [name] 1945/09/08 (71 y.o. Other Clinician: Date of Birth/Sex: Female) Treating Erin [name] Erin [name] Primary Care Provider: Garnet Koyanagi [name], Referring Provider: Tora [name] in Treatment: 2 History of Present Illness HPI Description: 10/20/16; this is a independent 71 year old woman who is a type II diabetic. She has had wounds on her right anterior to anterior legs for about a month. She thinks this was probably minor trauma although as opposed to prior wound she's had an these areas they have not closed over. She has been seeing her primary care physician for cellulitis I think around the right leg wound. She is been receiving doxycycline and I think also parenteral Rocephin at the office. The patient is a type II diabetic on insulin. She has peripheral neuropathy by history but does not have a history of PAD. She does describe pain in her legs when she but this is not exertional. ABIs in our clinic were noncompressible bilaterally. Other review of her chart in care everywhere reveals a history of mild leukocytosis with anemia and thrombocytopenia. She states the low platelets are unusual since she used to have high platelets. She has been referred to a hematologist 10/27/16; this is a patient with chronic venous insufficiency and venous inflammation. She has wounds on her right greater than left anterior legs in the setting of poorly controlled edema. She arrives today with concerns about the wraps we plan on wanting stockings etc. We went ahead and ordered her drugs to light stockings bilaterally however I've managed to convince her that she needs external compression before we can transition her into stockings 11/03/16; this is  a patient with chronic venous insufficiency venous inflammation and secondary lymphedema. She had wounds on her bilateral lower legs. The compression wraps put on last week were wrapped around her ankles when she arrives today. However fortunately there are no open wounds on her left leg and only a small open area on the anterior right lower leg. We've managed to get her juxta light stockings bilaterally arrives today with some shortness of breath. She has asthma and is on Advair Diskus as well as a rescue albuterol inhaler Electronic Signature(s) Signed: 11/04/2016 3:50:17 PM By: Linton Ham MD [name] ([id number]) Entered By: Linton Ham on 11/03/2016 10:34:15 [name] ([id number]) -------------------------------------------------------------------------------- Physical Exam Details Patient Name: Erin [name], Erin [name]. Date of Service: 11/03/2016 10:15 AM Medical Record Patient Account Number: [id number] Number: Treating RN: Erin [name] [birth year] (71 y.o. Other Clinician: Date of Birth/Sex: Female) Treating Erin [name] LOW[place] [name], Provider/Extender: [name] Primary Care Provider: Garnet Koyanagi [name], Referring Provider: Tora [name] in Treatment: 2 Constitutional Sitting or standing Blood Pressure is within target range for patient.Marland Kitchen Respiratory effort pronounced and rate above normal.. Temperature is normal and within the target range for the patient.Marland Kitchen appears in no distress. Eyes Conjunctivae clear. No discharge. Respiratory Above normal respiratory effort noted. Respiratory rate elveaated. Air entry is fairly good however she does have expiratory wheezing. Talking in full sentences. No accessory muscle use. Cardiovascular Heart rhythm and rate regular, without murmur or gallop. JVP not elevated. Pedal pulses present bilaterally. Edema present in both extremities. Severe bilateral chronic venous inflammation with  secondary lymphedema. Lymphatic And palpable in the popliteal or inguinal area. Psychiatric No evidence of depression, anxiety, or agitation. Calm, cooperative, and communicative. Appropriate  interactions and affect.. Notes Wound exam; the patient only has one small area on the right anterior leg that is still open. Healthy granulation. There is nothing open on the left leg. No debridement is required Electronic Signature(s) Signed: 11/04/2016 3:50:17 PM By: Linton Ham MD Entered By: Linton Ham on 11/03/2016 10:37:08 [name] ([id number]) -------------------------------------------------------------------------------- Physician Orders Details Patient Name: Erin [name]. Date of Service: 11/03/2016 10:15 AM Medical Record Patient Account Number: [id number] Number: Treating RN: Erin [name] 06-19-1945 (71 y.o. Other Clinician: Date of Birth/Sex: Female) Treating Erin [name] LOWNE [name], Provider/Extender: [name] Primary Care Provider: Garnet Koyanagi [name], Referring Provider: Tora [name] in Treatment: 2 Verbal / Phone Orders: No Diagnosis Coding Wound Cleansing Wound #2 Right,Anterior Lower Leg o Clean wound with Normal Saline. o Cleanse wound with mild soap and water Anesthetic Wound #2 Right,Anterior Lower Leg o Topical Lidocaine 4% cream applied to wound bed prior to debridement Primary Wound Dressing Wound #2 Right,Anterior Lower Leg o Aquacel Ag Secondary Dressing o Boardered Foam Dressing Dressing Change Frequency Wound #2 Right,Anterior Lower Leg o Change dressing every other day. Follow-up Appointments Wound #2 Right,Anterior Lower Leg o Return Appointment in 1 week. Edema Control Wound #2 Right,Anterior Lower Leg o Patient to wear own Juxtalite/Juzo compression garment. o Elevate legs to the level of the heart and pump ankles as often as possible Additional Orders / Instructions Wound #2 Right,Anterior Lower  Leg o Increase protein intake. Erin [name] ([id number]) Medications-please add to medication list. Wound #2 Right,Anterior Lower Leg o Other: - Vitamin C, Zinc, MVI Electronic Signature(s) Signed: 11/03/2016 5:04:35 PM By: Erin [name] Signed: 11/04/2016 3:50:17 PM By: Linton Ham MD Entered By: Erin [name] on 11/03/2016 10:49:13 [name] ([id number]) -------------------------------------------------------------------------------- Problem List Details Patient Name: Erin [name], Erin [name]. Date of Service: 11/03/2016 10:15 AM Medical Record Patient Account Number: [id number] Number: Treating RN: Erin [name] 01-Mar-1946 (71 y.o. 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 Date of Service: 11/03/2016 10:15 AM Medical Record Patient Account Number: [id number] Number: Treating RN: Erin [name] [birth year] (71 y.o. Other Clinician: Date of  Birth/Sex: Female) Treating Erin [name] [place], Provider/Extender: [name] Primary Care Provider: Garnet Koyanagi [name], Referring Provider: Tora [name] in Treatment: 2 Subjective History of Present Illness (HPI) 10/20/16; this is a independent 71 year old woman who is a type II diabetic. She has had wounds on her right anterior to anterior legs for about a month. She thinks this was probably minor trauma although as opposed to prior wound she's had an these areas they have not closed over. She has been seeing her primary care physician for cellulitis I think around the right leg wound. She is been receiving doxycycline and I think also parenteral Rocephin at the office. The patient is a  type II diabetic on insulin. She has peripheral neuropathy by history but does not have a history of PAD. She does describe pain in her legs when she but this is not exertional. ABIs in our clinic were noncompressible bilaterally. Other review of her chart in care everywhere reveals a history of mild leukocytosis with anemia and thrombocytopenia. She states the low platelets are unusual since she used to have high platelets. She has been referred to a hematologist 10/27/16; this is a patient with chronic venous insufficiency and venous inflammation. She has wounds on her right greater than left anterior legs in the setting of poorly controlled edema. She arrives today with concerns about the wraps we plan on wanting stockings etc. We went ahead and ordered her drugs to light stockings bilaterally however I've managed to convince her that she needs external compression before we can transition her into stockings 11/03/16; this is a patient with chronic venous insufficiency venous inflammation and secondary lymphedema. She had wounds on her bilateral lower legs. The compression wraps put on last week were wrapped around her ankles when she arrives today. However fortunately there are no open wounds on her  left leg and only a small open area on the anterior right lower leg. We've managed to get her juxta light stockings bilaterally arrives today with some shortness of breath. She has asthma and is on Advair Diskus as well as a rescue albuterol inhaler Erin [name], Erin [name]. ([id number]) Objective Constitutional Sitting or standing Blood Pressure is within target range for patient.Marland Kitchen Respiratory effort pronounced and rate above normal.. Temperature is normal and within the target range for the patient.Marland Kitchen appears in no distress. Vitals Time Taken: 10:07 AM, Height: 64 in, Weight: 260 lbs, BMI: 44.6, Temperature: 97.8 F, Pulse: 93 bpm, Respiratory Rate: 18 breaths/min, Blood Pressure: 162/56 mmHg. Eyes Conjunctivae clear. No discharge. Respiratory Above normal respiratory effort noted. Respiratory rate elveaated. Air entry is fairly good however she does have expiratory wheezing. Talking in full sentences. No accessory muscle use. Cardiovascular Heart rhythm and rate regular, without murmur or gallop. JVP not elevated. Pedal pulses present bilaterally. Edema present in both extremities. Severe bilateral chronic venous inflammation with secondary lymphedema. Lymphatic And palpable in the popliteal or inguinal area. Psychiatric No evidence of depression, anxiety, or agitation. Calm, cooperative, and communicative. Appropriate interactions and affect.. General Notes: Wound exam; the patient only has one small area on the right anterior leg that is still open. Healthy granulation. There is nothing open on the left leg. No debridement is required Integumentary (Hair, Skin) Wound #1 status is Healed - Epithelialized. Original cause of wound was Trauma. The wound is located on the Left Lower Leg. The wound measures 0cm length x 0cm width x 0cm depth; 0cm^2 area and 0cm^3 volume. Wound #2 status is Open. Original cause of wound was Blister. The wound is located on the Right,Anterior Lower Leg. The  wound measures 0.2cm length x 0.3cm width x 0.1cm depth; 0.047cm^2 area and 0.005cm^3 volume. There is no tunneling or undermining noted. There is a large amount of serosanguineous drainage noted. The wound margin is distinct with the outline attached to the wound base. There is large (67-100%) red granulation within the wound bed. There is no necrotic tissue within the wound bed. Periwound temperature was noted as No Abnormality. The periwound has tenderness on palpation. 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 Cleanse wound with mild soap and water Anesthetic: Wound #2 Right,Anterior Lower Leg: Topical Lidocaine 4% cream applied to wound bed prior to debridement Skin Barriers/Peri-Wound Care: Wound #2 Right,Anterior Lower Leg: Barrier cream Triamcinolone Acetonide Ointment Primary Wound Dressing: Wound #2 Right,Anterior Lower Leg: Aquacel Ag Secondary Dressing: Wound #2 Right,Anterior Lower Leg: ABD pad Dressing Change Frequency: Wound #2 Right,Anterior Lower Leg: Change dressing every week Follow-up Appointments: Wound #2 Right,Anterior Lower Leg: Return Appointment in 1 week. Edema Control: Wound #2 Right,Anterior Lower Leg: 3 Layer Compression System - Bilateral - unna to anchor Elevate legs to the level of the heart and pump ankles as often as possible Additional Orders / Instructions: Wound #2 Right,Anterior Lower Leg: Increase protein intake. Medications-please add  to medication list.: Wound #2 Right,Anterior Lower Leg: Erin [name], Erin [name] ([id number]) Other: - Vitamin C, Zinc, MVI #1 as the patient didn't seem to tolerate her compression wraps I thought we can move her to a jock slight stocking to the left leg which has healed with no open wound. We will use silver alginate, foam and jock slight stockings to the right leg today which the patient can change herself. #2 I'm hopeful to see this healed next week again with only the remaining right leg wound that is open #3 mild asthma exacertabtion. I suggested use of her albuterol inhaler which she has at home. Advised primary care apppt if not better or if she worsens. Patient expressed understanding Electronic Signature(s) Signed: 11/04/2016 3:50:17 PM By: Linton Ham MD Entered By: Linton Ham on 11/03/2016 10:40:47 [name] ([id number]) -------------------------------------------------------------------------------- SuperBill Details Patient Name: Bailey Mech [name]. Date of Service: 11/03/2016 Medical Record Patient Account Number: [id number] Number: Treating RN: Erin [name] 1946-02-05 (71 y.o. 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 E11.42 Type 2 diabetes mellitus with diabetic polyneuropathy I89.0 Lymphedema, not elsewhere classified Facility Procedures CPT4 Code: [id number] Description: 99213 - WOUND CARE VISIT-LEV 3 EST PT Modifier: Quantity: 1 Physician Procedures CPT4: Description  Modifier Quantity Code [id number] - WC PHYS LEVEL 3 - EST PT 1 ICD-10 Description Diagnosis I87.333 Chronic venous hypertension (idiopathic) with ulcer and inflammation of bilateral lower extremity L97.811 Non-pressure chronic ulcer  of other part of right lower leg limited to breakdown of skin Electronic Signature(s) Signed: 11/03/2016 11:21:17 AM By: Erin [name] Signed: 11/04/2016 3:50:17 PM By: Linton Ham MD Entered By: Erin [name] on 11/03/2016 11:21:16

## 2016-11-08 ENCOUNTER — Encounter: Payer: Self-pay | Admitting: Cardiology

## 2016-11-08 ENCOUNTER — Ambulatory Visit (INDEPENDENT_AMBULATORY_CARE_PROVIDER_SITE_OTHER): Payer: Medicare Other | Admitting: Internal Medicine

## 2016-11-08 VITALS — BP 142/84 | HR 91 | Wt 265.6 lb

## 2016-11-08 DIAGNOSIS — Z794 Long term (current) use of insulin: Secondary | ICD-10-CM | POA: Diagnosis not present

## 2016-11-08 DIAGNOSIS — E1121 Type 2 diabetes mellitus with diabetic nephropathy: Secondary | ICD-10-CM | POA: Diagnosis not present

## 2016-11-08 LAB — POCT GLYCOSYLATED HEMOGLOBIN (HGB A1C): Hemoglobin A1C: 8.4

## 2016-11-08 NOTE — Patient Instructions (Addendum)
Please continue: - Glipizide 10 mg 2x a day before meals. - Novolin ReliOn 70/30 - 15-30 min before the first and last meals of the day  - 35 units for a smaller meal  - 40 units for a larger meal   Try Almond milk:  Please return in 3 months with your sugar log.

## 2016-11-08 NOTE — Addendum Note (Signed)
Addended by: Caprice Beaver T on: 11/08/2016 12:45 PM   Modules accepted: Orders

## 2016-11-08 NOTE — Progress Notes (Signed)
Erin [name] ([id number]) Visit Report for 11/03/2016 Arrival Information Details Patient Name: Erin [name], Erin [name]. Date of Service: 11/03/2016 10:15 AM Medical Record Patient Account Number: [id number] Number: Treating RN: Erin [name] Date of Birth/Sex: Jan 07, 1946 (71 y.o. Female) Other Clinician: Carollee [name], Treating Erin [name] Primary Care Erin [name]: Erin [name] Erin [name]: Erin [name], Referring Erin [name]: Erin [name] in Treatment: 2 Visit Information History Since Last Visit Added or deleted any medications: No Patient Arrived: Wheel Chair Any new allergies or adverse reactions: No Arrival Time: 10:06 Had a fall or experienced change in No Accompanied By: self activities of daily living that may affect Transfer Assistance: None risk of falls: Patient Identification Verified: Yes Signs or symptoms of abuse/neglect since last No Secondary Verification Process Yes visito Completed: Hospitalized since last visit: No Patient Requires Transmission- No Has Dressing in Place as Prescribed: Yes Based Precautions: Has Compression in Place as Prescribed: Yes Patient Has Alerts: Yes Pain Present Now: No Patient Alerts: DM II L ABI non- compressible R ABI non- compressible Electronic Signature(s) Signed: 11/03/2016 5:04:35 PM By: Erin [name] Entered By: Erin [name] on 11/03/2016 10:07:10 Erin [name] ([id number]) -------------------------------------------------------------------------------- Clinic Level of Care Assessment Details Patient Name: Erin [name]. Date of Service: 11/03/2016 10:15 AM Medical Record Patient Account Number: [id number] Number: Treating RN: Erin [name] Date of Birth/Sex: 1945-07-04 (71 y.o. Female) Other Clinician: Carollee [name], Treating ROBSON, Erin [name] Primary Care Erin [name]: Erin [name] Erin [name]: Erin [name], Referring Erin [name]: Erin [name] in Treatment: 2 Clinic Level of Care  Assessment Items TOOL 4 Quantity Score []  - Use when only an EandM is performed on FOLLOW-UP visit 0 ASSESSMENTS - Nursing Assessment / Reassessment X - Reassessment of Co-morbidities (includes updates in patient status) 1 10 X - Reassessment of Adherence to Treatment Plan 1 5 ASSESSMENTS - Wound and Skin Assessment / Reassessment []  - Simple Wound Assessment / Reassessment - one wound 0 X - Complex Wound Assessment / Reassessment - multiple wounds 2 5 []  - Dermatologic / Skin Assessment (not related to wound area) 0 ASSESSMENTS - Focused Assessment X - Circumferential Edema Measurements - multi extremities 1 5 []  - Nutritional Assessment / Counseling / Intervention 0 X - Lower Extremity Assessment (monofilament, tuning fork, pulses) 1 5 []  - Peripheral Arterial Disease Assessment (using hand held doppler) 0 ASSESSMENTS - Ostomy and/or Continence Assessment and Care []  - Incontinence Assessment and Management 0 []  - Ostomy Care Assessment and Management (repouching, etc.) 0 PROCESS - Coordination of Care X - Simple Patient / Family Education for ongoing care 1 15 []  - Complex (extensive) Patient / Family Education for ongoing care 0 []  - Staff obtains Consents, Records, Test Results / Process Orders 0 Erin [name] ([id number]) []  - Staff telephones HHA, Nursing Homes / Clarify orders / etc 0 []  - Routine Transfer to another Facility (non-emergent condition) 0 []  - Routine Hospital Admission (non-emergent condition) 0 []  - New Admissions / Biomedical engineer / Ordering NPWT, Apligraf, etc. 0 []  - Emergency Hospital Admission (emergent condition) 0 X - Simple Discharge Coordination 1 10 []  - Complex (extensive) Discharge Coordination 0 PROCESS - Special Needs []  - Pediatric / Minor Patient Management 0 []  - Isolation Patient Management 0 []  - Hearing / Language / Visual special needs 0 []  - Assessment of Community assistance (transportation, D/C planning, etc.) 0 []  -  Additional assistance / Altered mentation 0 []  - Support Surface(s) Assessment (bed, cushion, seat, etc.) 0 INTERVENTIONS - Wound Cleansing / Measurement []  - Simple Wound Cleansing -  one wound 0 X - Complex Wound Cleansing - multiple wounds 2 5 X - Wound Imaging (photographs - any number of wounds) 1 5 []  - Wound Tracing (instead of photographs) 0 []  - Simple Wound Measurement - one wound 0 X - Complex Wound Measurement - multiple wounds 2 5 INTERVENTIONS - Wound Dressings X - Small Wound Dressing one or multiple wounds 1 10 []  - Medium Wound Dressing one or multiple wounds 0 []  - Large Wound Dressing one or multiple wounds 0 []  - Application of Medications - topical 0 []  - Application of Medications - injection 0 Erin [name], Erin [name]. ([id number]) INTERVENTIONS - Miscellaneous []  - External ear exam 0 []  - Specimen Collection (cultures, biopsies, blood, body fluids, etc.) 0 []  - Specimen(s) / Culture(s) sent or taken to Lab for analysis 0 []  - Patient Transfer (multiple staff / [name] / Similar devices) 0 []  - Simple Staple / Suture removal (25 or less) 0 []  - Complex Staple / Suture removal (26 or more) 0 []  - Hypo / Hyperglycemic Management (close monitor of Blood Glucose) 0 []  - Ankle / Brachial Index (ABI) - do not check if billed separately 0 X - Vital Signs 1 5 Has the patient been seen at the hospital within the last three years: Yes Total Score: 100 Level Of Care: New/Established - Level 3 Electronic Signature(s) Signed: 11/03/2016 5:04:35 PM By: Erin [name] Entered By: Erin [name] on 11/03/2016 11:21:05 Erin [name] ([id number]) -------------------------------------------------------------------------------- Encounter Discharge Information Details Patient Name: Erin [name], Erin [name]. Date of Service: 11/03/2016 10:15 AM Medical Record Patient Account Number: [id number] Number: Treating RN: Erin [name] Date of Birth/Sex: [birth year] (71 y.o. Female)  Other Clinician: Carollee [name], Treating Erin [name] Primary Care Erin [name]: Erin [name] Erin [name]: Erin [name], Referring Kyon Bentler: Erin [name] in Treatment: 2 Encounter Discharge Information Items Discharge Pain Level: 0 Discharge Condition: Stable Ambulatory Status: Wheelchair Discharge Destination: Home Transportation: Private Auto Accompanied By: self Schedule Follow-up Appointment: Yes Medication Reconciliation completed No and provided to Patient/Care Shalea Tomczak: Provided on Clinical Summary of Care: 11/03/2016 Form Type Recipient Paper Patient BW Electronic Signature(s) Signed: 11/03/2016 11:22:17 AM By: Erin [name] Previous Signature: 11/03/2016 10:50:24 AM Version By: Ruthine Dose Entered By: Erin [name] on 11/03/2016 11:22:17 Erin [name] ([id number]) -------------------------------------------------------------------------------- Lower Extremity Assessment Details Patient Name: Erin [name], Erin [name]. Date of Service: 11/03/2016 10:15 AM Medical Record Patient Account Number: [id number] Number: Treating RN: Erin [name] Date of Birth/Sex: July 17, 1945 (71 y.o. Female) Other Clinician: Carollee [name], Treating ROBSON, [place] Primary Care Tiernan Suto: Erin [name] Deagan Sevin/Extender: Erin [name], Referring Caramia Boutin: Erin [name] in Treatment: 2 Edema Assessment Assessed: [Left: No] [Right: No] Edema: [Left: Ye] [Right: s] Calf Left: Right: Point of Measurement: 30 cm From Medial Instep cm 54.2 cm Ankle Left: Right: Point of Measurement: 9 cm From Medial Instep cm 34.8 cm Vascular Assessment Pulses: Dorsalis Pedis Palpable: [Right:Yes] Posterior Tibial Extremity colors, hair growth, and conditions: Extremity Color: [Right:Hyperpigmented] Hair Growth on Extremity: [Right:No] Temperature of Extremity: [Right:Warm] Capillary Refill: [Right:< 3 seconds] Electronic Signature(s) Signed: 11/03/2016 5:04:35 PM By: Erin [name] Entered By: Erin [name] on 11/03/2016 10:26:04 Erin [name] ([id number]) -------------------------------------------------------------------------------- Multi Wound Chart Details Patient Name: Erin [name], Erin [name]. Date of Service: 11/03/2016 10:15 AM Medical Record Patient Account Number: [id number] Number: Treating RN: Erin [name] Date of Birth/Sex: 10/05/1945 (71 y.o. Female) Other Clinician: Carollee [name], Treating Erin [name] Primary Care Elene Downum: Erin [name] Daneka Lantigua/Extender: Erin [name], Referring Quin Mathenia: Erin [name] in Treatment: 2 Vital Signs Height(in): 64 Pulse(bpm):  93 Weight(lbs): 260 Blood Pressure 162/56 (mmHg): Body Mass Index(BMI): 45 Temperature(F): 97.8 Respiratory Rate 18 (breaths/min): Photos: [1:No Photos] [2:No Photos] [N/A:N/A] Wound Location: [1:Left Lower Leg] [2:Right Lower Leg - Anterior N/A] Wounding Event: [1:Trauma] [2:Blister] [N/A:N/A] Primary Etiology: [1:Diabetic Wound/Ulcer of the Lower Extremity] [2:Diabetic Wound/Ulcer of N/A the Lower Extremity] Secondary Etiology: [1:Trauma, Other] [2:N/A] [N/A:N/A] Comorbid History: [1:N/A] [2:Cataracts, Asthma, Congestive Heart Failure, Hypertension, Type II Diabetes, Gout, Osteoarthritis, Neuropathy] [N/A:N/A] Date Acquired: [1:09/20/2016] [2:09/20/2016] [N/A:N/A] Weeks of Treatment: [1:2] [2:2] [N/A:N/A] Wound Status: [1:Healed - Epithelialized] [2:Open] [N/A:N/A] Measurements L x W x D 0x0x0 [2:0.2x0.3x0.1] [N/A:N/A] (cm) Area (cm) : [1:0] [2:0.047] [N/A:N/A] Volume (cm) : [1:0] [2:0.005] [N/A:N/A] % Reduction in Area: [1:100.00%] [2:99.10%] [N/A:N/A] % Reduction in Volume: 100.00% [2:99.10%] [N/A:N/A] Classification: [1:Grade 1] [2:Grade 1] [N/A:N/A] Exudate Amount: [1:N/A] [2:Large] [N/A:N/A] Exudate Type: [1:N/A] [2:Serosanguineous] [N/A:N/A] Exudate Color: [1:N/A] [2:red, brown] [N/A:N/A] Wound Margin: [1:N/A] [2:Distinct, outline attached] [N/A:N/A] Granulation Amount: [1:N/A]  [2:Large (67-100%)] [N/A:N/A] Granulation Quality: [1:N/A] [2:Red] [N/A:N/A] Necrotic Amount: N/A None Present (0%) N/A Epithelialization: N/A None N/A Periwound Skin Texture: No Abnormalities Noted No Abnormalities Noted N/A Periwound Skin No Abnormalities Noted No Abnormalities Noted N/A Moisture: Periwound Skin Color: No Abnormalities Noted No Abnormalities Noted N/A Temperature: N/A No Abnormality N/A Tenderness on No Yes N/A Palpation: Wound Preparation: N/A Ulcer Cleansing: N/A Rinsed/Irrigated with Saline Topical Anesthetic Applied: Other: lidocaine 4% Treatment Notes Electronic Signature(s) Signed: 11/04/2016 3:50:17 PM By: Erin Ham MD Entered By: Erin [name] on 11/03/2016 10:27:50 Erin [name] ([id number]) -------------------------------------------------------------------------------- [place] Details Patient Name: Erin [name], Erin [name]. Date of Service: 11/03/2016 10:15 AM Medical Record Patient Account Number: [id number] Number: Treating RN: Erin [name] Date of Birth/Sex: 1945-07-21 (71 y.o. Female) Other Clinician: Carollee [name], Treating Dellia Nims [place] Primary Care Teauna Dubach: Erin [name] Theran Vandergrift/Extender: Erin [name], Referring Zanna Hawn: Erin [name] in Treatment: 2 Active Inactive ` Abuse / Safety / Falls / Self Care Management Nursing Diagnoses: Potential for falls Goals: Patient will not experience any injury related to falls Date Initiated: 10/20/2016 Target Resolution Date: 02/12/2017 Goal Status: Active Interventions: Assess impairment of mobility on admission and as needed per policy Notes: ` Nutrition Nursing Diagnoses: Imbalanced nutrition Impaired glucose control: actual or potential Potential for alteratiion in Nutrition/Potential for imbalanced nutrition Goals: Patient/caregiver will maintain therapeutic glucose control Date Initiated: 10/20/2016 Target Resolution Date: 02/12/2017 Goal Status:  Active Interventions: Assess patient nutrition upon admission and as needed per policy Notes: Erin [name], Erin [name] ([id number]) Orientation to the Wound Care Program Nursing Diagnoses: Knowledge deficit related to the wound healing center program Goals: Patient/caregiver will verbalize understanding of the [place] Program Date Initiated: 10/20/2016 Target Resolution Date: 11/06/2016 Goal Status: Active Interventions: Provide education on orientation to the wound center Notes: ` Pain, Acute or Chronic Nursing Diagnoses: Pain, acute or chronic: actual or potential Potential alteration in comfort, pain Goals: Patient/caregiver will verbalize adequate pain control between visits Date Initiated: 10/20/2016 Target Resolution Date: 02/12/2017 Goal Status: Active Interventions: Complete pain assessment as per visit requirements Notes: ` Wound/Skin Impairment Nursing Diagnoses: Impaired tissue integrity Knowledge deficit related to ulceration/compromised skin integrity Goals: Ulcer/skin breakdown will have a volume reduction of 80% by week 12 Date Initiated: 10/20/2016 Target Resolution Date: 02/05/2017 Goal Status: Active Interventions: Assess patient/caregiver ability to perform ulcer/skin care regimen upon admission and as needed Assess ulceration(s) every visit Erin [name], Erin [name] ([id number]) Notes: Electronic Signature(s) Signed: 11/03/2016 5:04:35 PM By: Erin [name] Entered By: Erin [name] on 11/03/2016 10:26:15 Erin [name], Erin [name]. ([id number]) --------------------------------------------------------------------------------  Pain Assessment Details Patient Name: ANNALESE, Erin [name]. Date of Service: 11/03/2016 10:15 AM Medical Record Patient Account Number: [id number] Number: Treating RN: Erin [name] Date of Birth/Sex: 29-Jul-1945 (71 y.o. Female) Other Clinician: Carollee [name], Treating Erin [name] Primary Care Amonie Wisser: Erin [name]  Evangelos Paulino/Extender: Erin [name], Referring Ivelis Norgard: Erin [name] in Treatment: 2 Active Problems Location of Pain Severity and Description of Pain Patient Has Paino No Site Locations Pain Management and Medication Current Pain Management: Notes Topical or injectable lidocaine is offered to patient for acute pain when surgical debridement is performed. If needed, Patient is instructed to use over the counter pain medication for the following 24-48 hours after debridement. Wound care MDs do not prescribed pain medications. Patient has chronic pain or uncontrolled pain. Patient has been instructed to make an appointment with their Primary Care Physician for pain management. Electronic Signature(s) Signed: 11/03/2016 5:04:35 PM By: Erin [name] Entered By: Erin [name] on 11/03/2016 10:07:18 Erin [name] ([id number]) -------------------------------------------------------------------------------- Patient/Caregiver Education Details Patient Name: Erin [name]. Date of Service: 11/03/2016 10:15 AM Medical Record Patient Account Number: [id number] Number: Treating RN: Erin [name] 1946-03-29 (71 y.o. Other Clinician: Date of Birth/Gender: Female) Treating ROBSON, MICHAEL Coram, Physician/Extender: G Primary Care Physician: Erin [name] in Treatment: [place], Referring Physician: [facility] Education Assessment Education Provided To: Patient Education Topics Provided Wound/Skin Impairment: Handouts: Other: wound care as ordered Methods: Demonstration, Explain/Verbal Responses: State content correctly Electronic Signature(s) Signed: 11/03/2016 5:04:35 PM By: Erin [name] Entered By: Erin [name] on 11/03/2016 11:22:34 Erin [name] ([id number]) -------------------------------------------------------------------------------- Wound Assessment Details Patient Name: Erin [name], Erin [name]. Date of Service: 11/03/2016 10:15 AM Medical Record  Patient Account Number: [id number] Number: Treating RN: Erin [name] Date of Birth/Sex: 02/09/1946 (71 y.o. Female) Other Clinician: Carollee [name], Treating ROBSON, [place] Primary Care Catharine Kettlewell: Erin [name] Andruw Battie/Extender: Erin [name], Referring Jurnee Nakayama: Erin [name] in Treatment: 2 Wound Status Wound Number: 1 Primary Etiology: Diabetic Wound/Ulcer of the Lower Extremity Wound Location: Left Lower Leg Secondary Trauma, Other Wounding Event: Trauma Etiology: Date Acquired: 09/20/2016 Wound Status: Healed - Epithelialized Weeks Of Treatment: 2 Clustered Wound: No Photos Photo Uploaded By: Erin [name] on 11/03/2016 16:03:53 Wound Measurements Length: (cm) 0 Width: (cm) 0 Depth: (cm) 0 Area: (cm) 0 Volume: (cm) 0 % Reduction in Area: 100% % Reduction in Volume: 100% Wound Description Classification: Grade 1 Periwound Skin Texture Texture Color No Abnormalities Noted: No No Abnormalities Noted: No Moisture No Abnormalities Noted: No TAMMYE, KAHLER ([id number]) Electronic Signature(s) Signed: 11/03/2016 5:04:35 PM By: Erin [name] Entered By: Erin [name] on 11/03/2016 10:16:33 Erin [name] ([id number]) -------------------------------------------------------------------------------- Wound Assessment Details Patient Name: Erin [name], Erin [name]. Date of Service: 11/03/2016 10:15 AM Medical Record Patient Account Number: [id number] Number: Treating RN: Erin [name] Date of Birth/Sex: 1945-06-15 (71 y.o. Female) Other Clinician: Carollee [name], Treating ROBSON, [place] Primary Care Lakima Dona: Erin [name] Elon Eoff/Extender: Erin [name], Referring Tareva Leske: Erin [name] in Treatment: 2 Wound Status Wound Number: 2 Primary Diabetic Wound/Ulcer of the Lower Etiology: Extremity Wound Location: Right Lower Leg - Anterior Wound Open Wounding Event: Blister Status: Date Acquired: 09/20/2016 Comorbid Cataracts, Asthma, Congestive  Heart Weeks Of Treatment: 2 History: Failure, Hypertension, Type II Diabetes, Clustered Wound: No Gout, Osteoarthritis, Neuropathy Photos Photo Uploaded By: Erin [name] on 11/03/2016 16:03:54 Wound Measurements Length: (cm) 0.2 Width: (cm) 0.3 Depth: (cm) 0.1 Area: (cm) 0.047 Volume: (cm) 0.005 % Reduction in Area: 99.1% % Reduction in Volume: 99.1% Epithelialization: None Tunneling: No Undermining: No Wound Description Classification: Grade 1  Foul Odor Afte Wound Margin: Distinct, outline attached Slough/Fibrino Exudate Amount: Large Exudate Type: Serosanguineous Exudate Color: red, brown r Cleansing: No Yes Wound Bed Granulation Amount: Large (67-100%) Erin [name], Tisa [name]. ([id number]) Granulation Quality: Red Necrotic Amount: None Present (0%) Periwound Skin Texture Texture Color No Abnormalities Noted: No No Abnormalities Noted: No Moisture Temperature / Pain No Abnormalities Noted: No Temperature: No Abnormality Tenderness on Palpation: Yes Wound Preparation Ulcer Cleansing: Rinsed/Irrigated with Saline Topical Anesthetic Applied: Other: lidocaine 4%, Treatment Notes Wound #2 (Right, Anterior Lower Leg) 1. Cleansed with: Clean wound with Normal Saline 2. Anesthetic Hurricaine Topical Anesthetic Spray 4. Dressing Applied: Aquacel Ag 5. Secondary Dressing Applied Bordered Foam Dressing Electronic Signature(s) Signed: 11/03/2016 5:04:35 PM By: Erin [name] Entered By: Erin [name] on 11/03/2016 10:22:09 Erin [name] ([id number]) -------------------------------------------------------------------------------- Vitals Details Patient Name: SHOWMAN, Lavaun [name]. Date of Service: 11/03/2016 10:15 AM Medical Record Patient Account Number: [id number] Number: Treating RN: Erin [name] Date of Birth/Sex: 1945/06/14 (71 y.o. Female) Other Clinician: Carollee [name], Treating ROBSON, [place] Primary Care Marlenne Ridge: Erin [name] Tatem Holsonback/Extender:  Erin [name], Referring Kiyah Demartini: Erin [name] in Treatment: 2 Vital Signs Time Taken: 10:07 Temperature (F): 97.8 Height (in): 64 Pulse (bpm): 93 Weight (lbs): 260 Respiratory Rate (breaths/min): 18 Body Mass Index (BMI): 44.6 Blood Pressure (mmHg): 162/56 Reference Range: 80 - 120 mg / dl Electronic Signature(s) Signed: 11/03/2016 5:04:35 PM By: Erin [name] Entered By: Erin [name] on 11/03/2016 10:09:10

## 2016-11-08 NOTE — Progress Notes (Signed)
Patient ID: Erin [name], female   DOB: 11-30-1945, 71 y.o.   MRN: [id number]  HPI: Erin [name] is a 71 y.o.-year-old female, returning for f/u for DM2, dx 2013, insulin-dependent, uncontrolled, with complications (CKD). Last visit 3.5 mo ago.  She had lost 15 pounds before last visit, but gained 6 pounds back since then. She started to switch towards a plant-based diet, but is still eating ice cream frequently.  Since last visit, she got 3 iron infusions, last one month ago  Last hemoglobin A1c was: Lab Results  Component Value Date   HGBA1C 12.4 07/23/2016   HGBA1C 11.2 Repeated and verified X2. (H) 01/06/2016   HGBA1C 8.4 05/01/2015   HGBA1C 9.0 (H) 12/10/2014   HGBA1C 5.8 01/11/2014   HGBA1C 6.8 (H) 08/02/2013   HGBA1C 7.1 (H) 07/05/2013   HGBA1C 7.2 (H) 01/17/2013   HGBA1C 6.7 (H) 11/02/2012   HGBA1C 6.6 (H) 07/18/2012   Pt was on a regimen of: - Glipizide 10 mg bid - Lantus 27 units in am  Now on (doughnut hole) - Glipizide 10 mg 2x a day before meals. - Novolin ReliOn 70/30 - 15-30 min before b'fast and supper  - 35 units for a smaller meal  - 40 units for a larger meal  She had to stop Tradjenta 5 mg for 1 year b/c price >> restarted now but still very expensive.  Had to stop Metformin 2/2 AKI after her back sx.  Pt checks her sugars 1-2x a day (no log, no meter): - am:200-300s >> 270s >> 150-200 >> 150-190s, some 200s - 2h after b'fast: 300s >> 185-220 >> n/c - before lunch:n/c >> 200-244 >> n/c - 2h after lunch: 300s-400 >> 207-244 >> n/c - before dinner: n/c  >> 280-350 >> 200-300 >> 200s - 2h after dinner: 1187-259, 280 >> n/c >> 290s >> n/c - bedtime: 95-126 >> n/c  Lowest sugar was 150 ; she has hypoglycemia awareness at 70.  Highest sugar was 400s >> 300s  Meals: - Breakfast: eggs + toast; occas. grits - Lunch: sandwich, soup - Dinner: meat + veggies + starch or sandwich - Snacks: grapes, icecream   - she has CKD, last BUN/creatinine:  Lab  Results  Component Value Date   BUN 48 (H) 10/18/2016   CREATININE 1.72 (H) 10/18/2016  11/30/2013 [facility] - Cr 1.8  On Lisinopril. - last set of lipids: Lab Results  Component Value Date   CHOL 102 10/18/2016   HDL 31.70 (L) 10/18/2016   LDLCALC 33 10/18/2016   LDLDIRECT 52.0 07/12/2016   TRIG 184.0 (H) 10/18/2016   CHOLHDL 3 10/18/2016  On Lipitor. - last eye exam was in 06/2016 >> No DR.She has a h/o Cataracts. She had R cataract sx. - she has numbness and tingling in her  big toe, not feet.   She also has a history of HL, COPD, depression, vit D def.   ROS: Constitutional: no weight gain/no weight loss, + fatigue, no subjective hyperthermia, no subjective hypothermia, + nocturia Eyes: no blurry vision, no xerophthalmia ENT: no sore throat, no nodules palpated in throat, no dysphagia, no odynophagia, no hoarseness Cardiovascular: no CP/+ SOB/no palpitations/+ leg swelling Respiratory: no cough/+ SOB/+ wheezing Gastrointestinal: no N/no V/no D/no C/no acid reflux Musculoskeletal: no muscle aches/no joint aches Skin: no rashes, no hair loss Neurological: no tremors/no dizziness  I reviewed pt's medications, allergies, PMH, social hx, family hx, and changes were documented in the history of present illness. Otherwise, unchanged from  my initial visit note.  PE: BP (!) 142/84   Pulse 91   Wt 265 lb 9.6 oz (120.5 kg)   SpO2 96%   BMI 45.59 kg/m  Body mass index is 45.59 kg/m. Wt Readings from Last 3 Encounters:  11/08/16 265 lb 9.6 oz (120.5 kg)  08/23/16 259 lb (117.5 kg)  07/23/16 266 lb 6.4 oz (120.8 kg)   Constitutional: overweight, in NAD Eyes: PERRLA, EOMI, no exophthalmos ENT: moist mucous membranes, no thyromegaly, no cervical lymphadenopathy Cardiovascular: tachycardia, RR, No MRG, + B pitting LE edema, legs wrapped Respiratory: CTA B Gastrointestinal: abdomen soft, NT, ND, BS+ Musculoskeletal: no deformities, strength intact in all 4 Skin:  moist, warm, no rashes Neurological: no tremor with outstretched hands, DTR normal in all 4  ASSESSMENT: 1. DM2, insulin-dependent, uncontrolled, with complications - CKD  2. Obesity class 3 BMI Classification:  < 18.5 underweight   18.5-24.9 normal weight   25.0-29.9 overweight   30.0-34.9 class I obesity   35.0-39.9 class II obesity   ? 40.0 class III obesity   PLAN:  1. Patient with long-standing, Uncontrolled diabetes, now with improving sugars after she started to improve her diet. After our discussion at last visit, she switched to more plant-based diet, however, she still eats a lot of ice cream and bread. We discussed about alternatives to the above and she will try them. Her HbA1c improved significantly, from 12.4% to 8.4% at this visit, but I am afraid that could be perceived due to her iron infusions. - however, due to the improvement in the HbA1c and with her changing her diet, I would suggest to remain on the current insulin doses for now while she continues to work on her diet - I advised her to:  Patient Instructions  Please continue: - Glipizide 10 mg 2x a day before meals. - Novolin ReliOn 70/30 - 15-30 min before the first and last meals of the day  - 35 units for a smaller meal  - 40 units for a larger meal   Try Almond milk:  Please return in 3 months with your sugar log.   - continue checking sugars at different times of the day - check 2-3x a day, rotating checks - advised for yearly eye exams >> she is UTD - Return to clinic in 3 mo with sugar log    2. Obesity class 3 - BMI >45 today - she did very well before last visit, losing 15 pounds, however, since last visit, she gained 6 back. I suspect that these are due to her ice cream and bread so I suggested to switch to almond milk slushies and also to switch to flat-out bread  Philemon Kingdom, MD PhD [facility]

## 2016-11-10 ENCOUNTER — Encounter: Payer: Medicare Other | Admitting: Physician Assistant

## 2016-11-10 DIAGNOSIS — I87333 Chronic venous hypertension (idiopathic) with ulcer and inflammation of bilateral lower extremity: Secondary | ICD-10-CM | POA: Diagnosis not present

## 2016-11-10 DIAGNOSIS — Z794 Long term (current) use of insulin: Secondary | ICD-10-CM | POA: Diagnosis not present

## 2016-11-10 DIAGNOSIS — L97811 Non-pressure chronic ulcer of other part of right lower leg limited to breakdown of skin: Secondary | ICD-10-CM | POA: Diagnosis not present

## 2016-11-10 DIAGNOSIS — E1142 Type 2 diabetes mellitus with diabetic polyneuropathy: Secondary | ICD-10-CM | POA: Diagnosis not present

## 2016-11-10 DIAGNOSIS — L97819 Non-pressure chronic ulcer of other part of right lower leg with unspecified severity: Secondary | ICD-10-CM | POA: Diagnosis not present

## 2016-11-10 DIAGNOSIS — I872 Venous insufficiency (chronic) (peripheral): Secondary | ICD-10-CM | POA: Diagnosis not present

## 2016-11-10 DIAGNOSIS — L97221 Non-pressure chronic ulcer of left calf limited to breakdown of skin: Secondary | ICD-10-CM | POA: Diagnosis not present

## 2016-11-10 DIAGNOSIS — I890 Lymphedema, not elsewhere classified: Secondary | ICD-10-CM | POA: Diagnosis not present

## 2016-11-11 ENCOUNTER — Other Ambulatory Visit: Payer: Self-pay | Admitting: Family Medicine

## 2016-11-11 MED ORDER — PRAMIPEXOLE DIHYDROCHLORIDE 1 MG PO TABS: 1.0000 mg | ORAL_TABLET | Freq: Every day | ORAL | 0 refills | Status: DC

## 2016-11-12 NOTE — Progress Notes (Signed)
ELLANORE, VANHOOK ([id number]) Visit Report for 11/10/2016 Chief Complaint Document Details Patient Name: Erin [name], Erin [name]. Date of Service: 11/10/2016 1:30 PM Medical Record Number: [id number] Patient Account Number: [id number] Date of Birth/Sex: July 09, 1945 (71 y.o. Female) Treating RN: Carolyne Fiscal, Debi Primary Care Provider: Carollee Herter, Kendrick Fries Other Clinician: Referring Provider: Carollee Herter, Kendrick Fries Treating Provider/Extender: Melburn Hake, HOYT Weeks in Treatment: 3 Information Obtained from: Patient Chief Complaint 10/20/16 patient is here for wounds on her bilateral anterior lower legs Electronic Signature(s) Signed: 11/10/2016 6:07:57 PM By: Worthy Keeler PA-C Entered By: Worthy Keeler on 11/10/2016 13:50:56 [name] ([id number]) -------------------------------------------------------------------------------- HPI Details Patient Name: Erin Mech [name]. Date of Service: 11/10/2016 1:30 PM Medical Record Number: [id number] Patient Account Number: [id number] Date of Birth/Sex: [birth year] (71 y.o. Female) Treating RN: Carolyne Fiscal, Debi Primary Care Provider: Carollee Herter, Kendrick Fries Other Clinician: Referring Provider: Carollee Herter, Kendrick Fries Treating Provider/Extender: Worthy Keeler Weeks in Treatment: 3 History of Present Illness HPI Description: 10/20/16; this is a independent 71 year old woman who is a type II diabetic. She has had wounds on her right anterior to anterior legs for about a month. She thinks this was probably minor trauma although as opposed to prior wound she's had an these areas they have not closed over. She has been seeing her primary care physician for cellulitis I think around the right leg wound. She is been receiving doxycycline and I think also parenteral Rocephin at the office. The patient is a type II diabetic on insulin. She has peripheral neuropathy by history but does not have a history of PAD. She does describe pain in her legs when she but this is not  exertional. ABIs in our clinic were noncompressible bilaterally. Other review of her chart in care everywhere reveals a history of mild leukocytosis with anemia and thrombocytopenia. She states the low platelets are unusual since she used to have high platelets. She has been referred to a hematologist 10/27/16; this is a patient with chronic venous insufficiency and venous inflammation. She has wounds on her right greater than left anterior legs in the setting of poorly controlled edema. She arrives today with concerns about the wraps we plan on wanting stockings etc. We went ahead and ordered her drugs to light stockings bilaterally however I've managed to convince her that she needs external compression before we can transition her into stockings 11/03/16; this is a patient with chronic venous insufficiency venous inflammation and secondary lymphedema. She had wounds on her bilateral lower legs. The compression wraps put on last week were wrapped around her ankles when she arrives today. However fortunately there are no open wounds on her left leg and only a small open area on the anterior right lower leg. We've managed to get her juxta light stockings bilaterally arrives today with some shortness of breath. She has asthma and is on Advair Diskus as well as a rescue albuterol inhaler 11/10/16 on evaluation today patient's right lower extremity anterior wound appears completely healed. She is having no pain and there is no erythema or fluctuance noted. All in all this appears to be doing very well. Electronic Signature(s) Signed: 11/10/2016 6:07:57 PM By: Worthy Keeler PA-C Entered By: Worthy Keeler on 11/10/2016 13:54:10 [name] ([id number]) -------------------------------------------------------------------------------- Physical Exam Details Patient Name: Erin [name], Erin [name]. Date of Service: 11/10/2016 1:30 PM Medical Record Number: [id number] Patient Account Number: [id number] Date  of Birth/Sex: 04-18-1945 (71 y.o. Female) Treating RN: Carolyne Fiscal, Debi Primary Care Provider: Roma Schanz Other Clinician: Referring  Provider: Carollee Herter, Kendrick Fries Treating Provider/Extender: Melburn Hake, HOYT Weeks in Treatment: 3 Constitutional Obese and well-hydrated in no acute distress. Respiratory normal breathing without difficulty. clear to auscultation bilaterally. Psychiatric this patient is able to make decisions and demonstrates good insight into disease process. Alert and Oriented x 3. pleasant and cooperative. Notes Patient's wound is completely healed and there is no evidence on evaluation today that the wound was even present last week. Obviously this is excellent news. Electronic Signature(s) Signed: 11/10/2016 6:07:57 PM By: Worthy Keeler PA-C Entered By: Worthy Keeler on 11/10/2016 13:54:34 [name] ([id number]) -------------------------------------------------------------------------------- Physician Orders Details Patient Name: Erin [name], Erin [name]. Date of Service: 11/10/2016 1:30 PM Medical Record Number: [id number] Patient Account Number: [id number] Date of Birth/Sex: [birth year] (71 y.o. 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 Wear your compression everyday and take off at night. Please call or contact our office if you have any questions or concerns. Notes I recommended that she continue with her compression therapy currently and we will see were things stand on evaluation in the future if she needs it. However at this point I'm good otherwise discontinue wound care services that she appears to be doing well at this time. She will keep her appointment with [facility] which is scheduled for September. Electronic Signature(s) Signed: 11/10/2016 6:07:57 PM By: Worthy Keeler PA-C Entered By: Worthy Keeler on 11/10/2016 13:55:13 [name] ([id number]) -------------------------------------------------------------------------------- Problem List Details Patient Name: Erin [name], Erin [name]. Date of Service: 11/10/2016 1:30 PM Medical Record Number: [id number] Patient Account Number: [id number] Date of Birth/Sex: 1946-04-05 (71 y.o. Female) Treating RN: Carolyne Fiscal, Debi Primary Care Provider: Carollee Herter, Kendrick Fries Other Clinician: Referring Provider: Carollee Herter, Kendrick Fries Treating Provider/Extender: Sharalyn Ink in Treatment: 3 Active Problems ICD-10 Encounter Code Description Active Date Diagnosis I87.333 Chronic venous hypertension (idiopathic) with ulcer and 10/20/2016 Yes inflammation of bilateral lower extremity L97.811 Non-pressure chronic ulcer of other part of right lower leg 10/20/2016 Yes limited to breakdown of skin L97.221 Non-pressure chronic ulcer of left calf limited to 10/20/2016 Yes breakdown of skin E11.42 Type 2 diabetes mellitus with diabetic polyneuropathy 10/20/2016 Yes I89.0 Lymphedema, not elsewhere classified 10/20/2016 Yes Inactive Problems Resolved Problems Electronic Signature(s) Signed: 11/10/2016 6:07:57 PM By: Worthy Keeler PA-C Entered By: Worthy Keeler on 11/10/2016 13:50:46 [name]  ([id number]) -------------------------------------------------------------------------------- Progress Note Details Patient Name: Erin [name], Erin [name]. Date of Service: 11/10/2016 1:30 PM Medical Record Number: [id number] Patient Account Number: [id number] Date of Birth/Sex: Sep 16, 1945 (71 y.o. Female) Treating RN: Carolyne Fiscal, Debi Primary Care Provider: Carollee Herter, Kendrick Fries Other Clinician: Referring Provider: Carollee Herter, Kendrick Fries Treating Provider/Extender: Sharalyn Ink in Treatment: 3 Subjective Chief Complaint Information obtained from Patient 10/20/16 patient is here for wounds on her bilateral anterior lower legs History of Present Illness (HPI) 10/20/16; this is a independent 71 year old woman  who is a type II diabetic. She has had wounds on her right anterior to anterior legs for about a month. She thinks this was probably minor trauma although as opposed to prior wound she's had an these areas they have not closed over. She has been seeing her primary care physician for cellulitis I think around the right leg wound. She is been receiving doxycycline and I think also parenteral Rocephin at the office. The patient is a type II diabetic on insulin. She has peripheral neuropathy by history but does not have a history of PAD. She does describe pain in her legs when she but this is not exertional. ABIs in our clinic were noncompressible bilaterally. Other review of her chart in care everywhere reveals a history of mild leukocytosis with anemia and thrombocytopenia. She states the low platelets are unusual since she used to have high platelets. She has been referred to a hematologist 10/27/16; this is a patient with chronic venous insufficiency and venous inflammation. She has wounds on her right greater than left anterior legs in the setting of poorly controlled edema. She arrives today with concerns about the wraps we plan on wanting stockings etc. We went ahead and ordered her drugs to  light stockings bilaterally however I've managed to convince her that she needs external compression before we can transition her into stockings 11/03/16; this is a patient with chronic venous insufficiency venous inflammation and secondary lymphedema. She had wounds on her bilateral lower legs. The compression wraps put on last week were wrapped around her ankles when she arrives today. However fortunately there are no open wounds on her left leg and only a small open area on the anterior right lower leg. We've managed to get her juxta light stockings bilaterally arrives today with some shortness of breath. She has asthma and is on Advair Diskus as well as a rescue albuterol inhaler 11/10/16 on evaluation today patient's right lower extremity anterior wound appears completely healed. She is having no pain and there is no erythema or fluctuance noted. All in all this appears to be doing very well. Erin [name], Erin Audree BMarland Kitchen ([id number]) Objective Constitutional Obese and well-hydrated in no acute distress. Vitals Time Taken: 1:44 PM, Height: 64 in, Weight: 260 lbs, BMI: 44.6, Temperature: 97.7 F, Pulse: 89 bpm, Respiratory Rate: 18 breaths/min, Blood Pressure: 162/58 mmHg. Respiratory normal breathing without difficulty. clear to auscultation bilaterally. Psychiatric this patient is able to make decisions and demonstrates good insight into disease process. Alert and Oriented x 3. pleasant and cooperative. General Notes: Patient's wound is completely healed and there is no evidence on evaluation today that the wound was even present last week. Obviously this is excellent news. Integumentary (Hair, Skin) Wound #2 status is Healed - Epithelialized. Original cause of wound was Blister. The wound is located on the Right,Anterior Lower Leg. The wound measures 0cm length x 0cm width x 0cm depth; 0cm^2 area and 0cm^3 volume. There is no tunneling or undermining noted. There is a none present amount of  drainage noted. The wound margin is flat and intact. There is no granulation within the wound bed. There is no necrotic tissue within the wound bed. 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 Wear your compression everyday and take off at night. Please call or contact our office if you have any questions or concerns. General Notes: I recommended that she continue with her compression therapy currently and we will see were things stand on evaluation in the future if she needs it. However at this point I'm good otherwise discontinue wound care services that she appears to be doing well at this time. She will keep her appointment with [facility] which is scheduled for September. Electronic Signature(s) Signed: 11/10/2016 6:07:57 PM By: Worthy Keeler PA-C Entered By: Worthy Keeler on 11/10/2016 13:55:20 [name] ([id number]) -------------------------------------------------------------------------------- SuperBill Details Patient Name: Erin Mech [name]. Date of Service: 11/10/2016 Medical Record Number: [id number] Patient Account Number: [id number] Date of Birth/Sex: [birth year] (71 y.o. 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 E11.42 Type 2 diabetes mellitus with diabetic polyneuropathy I89.0 Lymphedema, not elsewhere classified Facility Procedures CPT4 Code: [id number] Description: [phone] - WOUND CARE VISIT-LEV 2 EST PT Modifier: Quantity: 1 Physician Procedures CPT4: Description Modifier Quantity Code [id number] - WC PHYS LEVEL 2 - EST PT 1 ICD-10 Description Diagnosis I87.333 Chronic venous hypertension (idiopathic) with ulcer and inflammation of bilateral lower extremity L97.811 Non-pressure chronic ulcer  of other part of right lower leg limited to breakdown of skin L97.221 Non-pressure chronic ulcer of left calf limited to breakdown of skin E11.42 Type 2 diabetes mellitus with diabetic polyneuropathy Electronic Signature(s) Signed: 11/10/2016 4:55:24 PM By: Alric Quan Signed: 11/10/2016 6:07:57 PM By: Worthy Keeler PA-C Entered By: Alric Quan on 11/10/2016 14:28:19

## 2016-11-18 ENCOUNTER — Other Ambulatory Visit: Payer: Self-pay | Admitting: Family Medicine

## 2016-11-18 NOTE — Progress Notes (Signed)
HPI: FU dyspnea/CHF. Patient was admitted in [facility] in August 2015 with malignant hypertension, dyspnea and chest pain. She had acute renal failure. Cardiac markers were negative. Echocardiogram showed ejection fraction 55-60% and mild pulmonary hypertension. VQ scan negative. Echocardiogram December 2015 showed normal LV function and mild left atrial enlargement. Nuclear study March 2016 showed ejection fraction 76% and normal perfusion. Also with significant renal insuff. Since last seen, she does have dyspnea on exertion unchanged. No orthopnea or PND. Mild pedal edema. No chest pain or syncope. She is being evaluated for possible leukemia.  Current Outpatient Prescriptions  Medication Sig Dispense Refill  . albuterol (PROVENTIL) (2.5 MG/3ML) 0.083% nebulizer solution Take 3 mLs (2.5 mg total) by nebulization every 6 (six) hours as needed for wheezing or shortness of breath. 75 mL 12  . allopurinol (ZYLOPRIM) 100 MG tablet Take 1 tablet by mouth 2 (two) times daily.    Marland Kitchen atorvastatin (LIPITOR) 40 MG tablet TAKE ONE TABLET BY MOUTH ONCE DAILY 90 tablet 1  . busPIRone (BUSPAR) 15 MG tablet TAKE 1 TABLET BY MOUTH THREE TIMES DAILY 90 tablet 0  . carvedilol (COREG) 6.25 MG tablet Take 1 tablet (6.25 mg total) by mouth every 12 (twelve) hours. Please schedule appointment for refills. 180 tablet 0  . doxycycline (VIBRAMYCIN) 100 MG capsule Take 1 capsule (100 mg total) by mouth 2 (two) times daily. Can give caps or generic 14 capsule 0  . ergocalciferol (VITAMIN D2) 50000 UNITS capsule Take 50,000 Units by mouth once a week.    . fenofibrate 160 MG tablet Take 1 tablet (160 mg total) by mouth daily. 30 tablet 2  . Ferrous Sulfate (IRON) 325 (65 FE) MG TABS Take 1 tablet by mouth 2 (two) times daily.    . Fluticasone Furoate-Vilanterol (BREO ELLIPTA) 200-25 MCG/INH AEPB Inhale 1 puff into the lungs daily.    . Fluticasone-Salmeterol (ADVAIR) 250-50 MCG/DOSE AEPB Inhale 1 puff into the lungs 2  (two) times daily.    Marland Kitchen gabapentin (NEURONTIN) 100 MG capsule TAKE 1 CAPSULE BY MOUTH AT BEDTIME 30 capsule 0  . glipiZIDE (GLUCOTROL) 10 MG tablet Take 1 tablet (10 mg total) by mouth 2 (two) times daily before a meal. 180 tablet 3  . insulin NPH-regular Human (NOVOLIN 70/30 RELION) (70-30) 100 UNIT/ML injection Inject under skin 15-30 min before b'fast and supper: 35-40 units 3x a day. ReliOn. 30 mL 5  . Insulin Syringe-Needle U-100 (B-D INS SYR ULTRAFINE .5CC/30G) 30G X 1/2" 0.5 ML MISC Use 3x a day 300 each 3  . metolazone (ZAROXOLYN) 5 MG tablet Take 1 tablet weekly 30 minutes prior to furosemide dose    . NOVOLIN 70/30 RELION (70-30) 100 UNIT/ML injection INJECT  SUBCUTANEOUSLY 15-30 MINUTES BEFORE BREAKFAST AND WITH SUPPER; 35-40 UNITS AS ADVISED 20 mL 5  . pantoprazole (PROTONIX) 40 MG tablet TAKE ONE TABLET BY MOUTH ONCE DAILY 90 tablet 1  . potassium chloride (K-DUR,KLOR-CON) 10 MEQ tablet Take by mouth.    . pramipexole (MIRAPEX) 1 MG tablet Take 1 tablet (1 mg total) by mouth at bedtime. 90 tablet 0  . sertraline (ZOLOFT) 50 MG tablet Take 1.5 tablets (75 mg total) by mouth daily. 135 tablet 3  . sulfamethoxazole-trimethoprim (BACTRIM DS,SEPTRA DS) 800-160 MG tablet Take 1 tablet by mouth daily.     Marland Kitchen sulfamethoxazole-trimethoprim (BACTRIM DS,SEPTRA DS) 800-160 MG tablet Take 1 tablet by mouth 2 (two) times daily. 10 tablet 0  . torsemide (DEMADEX) 20 MG tablet Take  80 mg by mouth.    . torsemide (DEMADEX) 20 MG tablet TAKE FOUR TABLETS BY MOUTH TWICE DAILY     No current facility-administered medications for this visit.      Past Medical History:  Diagnosis Date  . Asthma   . CHF (congestive heart failure) [facility])    hospital 11/2013-- [place]  . Chronic kidney disease   . Depression   . Diabetes mellitus ([place])   . GERD (gastroesophageal reflux disease)   . Hyperlipidemia   . Hypertension     Past Surgical History:  Procedure Laterality Date  . CHOLECYSTECTOMY  2002    . SPINE SURGERY  aug 2015   [facility]  . TONSILLECTOMY    . TUBAL LIGATION      Social History   Social History  . Marital status: Widowed    Spouse name: N/A  . Number of children: 4  . Years of education: N/A   Occupational History  . DELI-MANAGER @ [employer] History Main Topics  . Smoking status: Former Smoker    Packs/day: 1.50    Years: 20.00    Types: Cigarettes    Quit date: 08/02/1984  . Smokeless tobacco: Never Used  . Alcohol use No  . Drug use: No  . Sexual activity: Not Currently    Partners: Male   Other Topics Concern  . Not on file   Social History Narrative   NO REG EXERCISE   Caffeine use: daily    Family History  Problem Relation Age of Onset  . Hypertension Mother   . Alzheimer's disease Mother   . Hyperlipidemia Mother   . Heart disease Father        cad  . Asthma Father   . Cancer Father 9       leukemia  . Stroke Father   . Hypertension Father   . Heart disease Sister 21       MI  . Pulmonary fibrosis Sister   . Arthritis Brother   . Heart disease Maternal Uncle   . Stroke Maternal Uncle   . Uterine cancer Paternal Grandmother   . Stomach cancer Paternal Grandfather   . Heart disease Maternal Uncle   . Heart disease Maternal Uncle     ROS: Arthralgias but no fevers or chills, productive cough, hemoptysis, dysphasia, odynophagia, melena, hematochezia, dysuria, hematuria, rash, seizure activity, orthopnea, PND, claudication. Remaining systems are negative.  Physical Exam: Well-developed obese in no acute distress.  Skin is warm and dry.  HEENT is normal.  Neck is supple.  Chest is clear to auscultation with normal expansion.  Cardiovascular exam is regular rate and rhythm.  Abdominal exam nontender or distended. No masses palpated. Extremities show no edema. neuro grossly intact   A/P  1 Chronic diastolic congestive heart failure-continue present dose of diuretic. Renal function is monitored  by primary care.  2 dyspnea-this has been felt multifactorial in etiology. This includes obesity hypoventilation syndrome, obstructive sleep apnea, deconditioning and pulmonary venous hypertension. Patient encouraged to be compliant with CPAP. Continue present dose of diuretic.   3 morbid obesity-patient counseled on weight loss.   4 hypertension-blood pressure is mildly elevated. She does not want to increase her medicines at this point. I have asked her to track this and we will increase carvedilol as needed.   5 hyperlipidemia-continue statin. Lipids and liver monitored by primary care.   Kirk Ruths, MD

## 2016-11-19 DIAGNOSIS — D696 Thrombocytopenia, unspecified: Secondary | ICD-10-CM | POA: Diagnosis not present

## 2016-11-19 DIAGNOSIS — C9210 Chronic myeloid leukemia, BCR/ABL-positive, not having achieved remission: Secondary | ICD-10-CM | POA: Diagnosis not present

## 2016-11-19 DIAGNOSIS — D649 Anemia, unspecified: Secondary | ICD-10-CM | POA: Diagnosis not present

## 2016-11-19 DIAGNOSIS — D72829 Elevated white blood cell count, unspecified: Secondary | ICD-10-CM | POA: Diagnosis not present

## 2016-11-23 ENCOUNTER — Ambulatory Visit (INDEPENDENT_AMBULATORY_CARE_PROVIDER_SITE_OTHER): Payer: Medicare Other | Admitting: Cardiology

## 2016-11-23 ENCOUNTER — Encounter: Payer: Self-pay | Admitting: Cardiology

## 2016-11-23 VITALS — BP 156/60 | HR 84 | Ht 64.0 in | Wt 263.0 lb

## 2016-11-23 DIAGNOSIS — I5032 Chronic diastolic (congestive) heart failure: Secondary | ICD-10-CM | POA: Diagnosis not present

## 2016-11-23 DIAGNOSIS — E785 Hyperlipidemia, unspecified: Secondary | ICD-10-CM | POA: Diagnosis not present

## 2016-11-23 DIAGNOSIS — I10 Essential (primary) hypertension: Secondary | ICD-10-CM | POA: Diagnosis not present

## 2016-11-23 NOTE — Patient Instructions (Signed)
Your physician wants you to follow-up in: [place] will receive a reminder letter in the mail two months in advance. If you don't receive a letter, please call our office to schedule the follow-up appointment.   If you need a refill on your cardiac medications before your next appointment, please call your pharmacy.   TRACK BLOOD PRESSURE AND CALL IF NUMBERS ARE CONSISTENTLY ABOVE 130/85

## 2016-11-24 ENCOUNTER — Other Ambulatory Visit (HOSPITAL_COMMUNITY)
Admission: RE | Admit: 2016-11-24 | Disposition: A | Discharge status: Home / Self Care | Payer: Medicare Other | Source: Ambulatory Visit | Attending: Oncology | Admitting: Oncology

## 2016-11-24 DIAGNOSIS — D649 Anemia, unspecified: Secondary | ICD-10-CM | POA: Diagnosis not present

## 2016-11-24 DIAGNOSIS — D696 Thrombocytopenia, unspecified: Secondary | ICD-10-CM | POA: Diagnosis not present

## 2016-11-24 DIAGNOSIS — D473 Essential (hemorrhagic) thrombocythemia: Secondary | ICD-10-CM | POA: Diagnosis not present

## 2016-11-24 DIAGNOSIS — D72829 Elevated white blood cell count, unspecified: Secondary | ICD-10-CM | POA: Diagnosis not present

## 2016-11-26 DIAGNOSIS — C9210 Chronic myeloid leukemia, BCR/ABL-positive, not having achieved remission: Secondary | ICD-10-CM | POA: Diagnosis not present

## 2016-12-03 DIAGNOSIS — D7581 Myelofibrosis: Secondary | ICD-10-CM | POA: Diagnosis not present

## 2016-12-03 DIAGNOSIS — D899 Disorder involving the immune mechanism, unspecified: Secondary | ICD-10-CM | POA: Diagnosis not present

## 2016-12-03 DIAGNOSIS — D696 Thrombocytopenia, unspecified: Secondary | ICD-10-CM | POA: Diagnosis not present

## 2016-12-03 DIAGNOSIS — D61818 Other pancytopenia: Secondary | ICD-10-CM | POA: Diagnosis not present

## 2016-12-11 ENCOUNTER — Other Ambulatory Visit: Payer: Self-pay | Admitting: Medical

## 2016-12-20 ENCOUNTER — Other Ambulatory Visit (INDEPENDENT_AMBULATORY_CARE_PROVIDER_SITE_OTHER): Payer: Self-pay | Admitting: Vascular Surgery

## 2016-12-20 ENCOUNTER — Ambulatory Visit (INDEPENDENT_AMBULATORY_CARE_PROVIDER_SITE_OTHER): Payer: Medicare Other | Admitting: Vascular Surgery

## 2016-12-20 ENCOUNTER — Encounter (INDEPENDENT_AMBULATORY_CARE_PROVIDER_SITE_OTHER): Payer: Self-pay | Admitting: Vascular Surgery

## 2016-12-20 ENCOUNTER — Ambulatory Visit (INDEPENDENT_AMBULATORY_CARE_PROVIDER_SITE_OTHER): Payer: Medicare Other

## 2016-12-20 VITALS — BP 173/73 | HR 94 | Resp 17 | Ht 64.0 in | Wt 260.0 lb

## 2016-12-20 DIAGNOSIS — M7989 Other specified soft tissue disorders: Secondary | ICD-10-CM

## 2016-12-20 DIAGNOSIS — E1121 Type 2 diabetes mellitus with diabetic nephropathy: Secondary | ICD-10-CM

## 2016-12-20 DIAGNOSIS — I872 Venous insufficiency (chronic) (peripheral): Secondary | ICD-10-CM | POA: Insufficient documentation

## 2016-12-20 DIAGNOSIS — Z794 Long term (current) use of insulin: Secondary | ICD-10-CM

## 2016-12-20 DIAGNOSIS — I890 Lymphedema, not elsewhere classified: Secondary | ICD-10-CM

## 2016-12-20 DIAGNOSIS — I10 Essential (primary) hypertension: Secondary | ICD-10-CM

## 2016-12-20 DIAGNOSIS — I83813 Varicose veins of bilateral lower extremities with pain: Secondary | ICD-10-CM | POA: Diagnosis not present

## 2016-12-20 NOTE — Progress Notes (Signed)
MRN : [id number]  Erin [name] is a 71 y.o. (01-24-1946) female who presents with chief complaint of  Chief Complaint  Patient presents with  . New Patient (Initial Visit)    ble ven reflux  .  History of Present Illness: Patient is seen for evaluation of leg pain and leg swelling. The patient first noticed the swelling remotely. The swelling is associated with pain and discoloration. The pain and swelling worsens with prolonged dependency and improves with elevation. The pain is unrelated to activity.  The patient notes that in the morning the legs are significantly improved but they steadily worsened throughout the course of the day. The patient also notes a steady worsening of the discoloration in the ankle and shin area.   She has a history of a venous ulcer and this has healed.  The patient denies claudication symptoms.  The patient denies symptoms consistent with rest pain.  The patient denies and extensive history of DJD and LS spine disease.  The patient has no had any past angiography, interventions or vascular surgery.  Elevation makes the leg symptoms better, dependency makes them much worse. There is no history of ulcerations. The patient denies any recent changes in medications.  The patient has been wearing graduated compression wraps for several months and this has allowed healing of her venous ulcer.  The patient denies a history of DVT or PE. There is no prior history of phlebitis. There is no history of primary lymphedema.  No history of malignancies. No history of trauma or groin or pelvic surgery. There is no history of radiation treatment to the groin or pelvis  The patient denies amaurosis fugax or recent TIA symptoms. There are no recent neurological changes noted. The patient denies recent episodes of angina or shortness of breath  Current Meds  Medication Sig  . albuterol (PROVENTIL) (2.5 MG/3ML) 0.083% nebulizer solution Take 3 mLs (2.5 mg total) by  nebulization every 6 (six) hours as needed for wheezing or shortness of breath.  . allopurinol (ZYLOPRIM) 100 MG tablet Take 1 tablet by mouth 2 (two) times daily.  Marland Kitchen atorvastatin (LIPITOR) 40 MG tablet TAKE ONE TABLET BY MOUTH ONCE DAILY  . busPIRone (BUSPAR) 15 MG tablet TAKE 1 TABLET BY MOUTH THREE TIMES DAILY  . carvedilol (COREG) 6.25 MG tablet Take 1 tablet (6.25 mg total) by mouth every 12 (twelve) hours. Please schedule appointment for refills.  . doxycycline (VIBRAMYCIN) 100 MG capsule Take 1 capsule (100 mg total) by mouth 2 (two) times daily. Can give caps or generic  . ergocalciferol (VITAMIN D2) 50000 UNITS capsule Take 50,000 Units by mouth once a week.  . fenofibrate 160 MG tablet Take 1 tablet (160 mg total) by mouth daily.  . Ferrous Sulfate (IRON) 325 (65 FE) MG TABS Take 1 tablet by mouth 2 (two) times daily.  . Fluticasone Furoate-Vilanterol (BREO ELLIPTA) 200-25 MCG/INH AEPB Inhale 1 puff into the lungs daily.  . Fluticasone-Salmeterol (ADVAIR) 250-50 MCG/DOSE AEPB Inhale 1 puff into the lungs 2 (two) times daily.  Marland Kitchen gabapentin (NEURONTIN) 100 MG capsule TAKE 1 CAPSULE BY MOUTH AT BEDTIME  . glipiZIDE (GLUCOTROL) 10 MG tablet Take 1 tablet (10 mg total) by mouth 2 (two) times daily before a meal.  . insulin NPH-regular Human (NOVOLIN 70/30 RELION) (70-30) 100 UNIT/ML injection Inject under skin 15-30 min before b'fast and supper: 35-40 units 3x a day. ReliOn.  . Insulin Syringe-Needle U-100 (B-D INS SYR ULTRAFINE .5CC/30G) 30G X 1/2" 0.5 ML  MISC Use 3x a day  . metolazone (ZAROXOLYN) 5 MG tablet Take 1 tablet weekly 30 minutes prior to furosemide dose  . NOVOLIN 70/30 RELION (70-30) 100 UNIT/ML injection INJECT  SUBCUTANEOUSLY 15-30 MINUTES BEFORE BREAKFAST AND WITH SUPPER; 35-40 UNITS AS ADVISED  . pantoprazole (PROTONIX) 40 MG tablet TAKE ONE TABLET BY MOUTH ONCE DAILY  . potassium chloride (K-DUR,KLOR-CON) 10 MEQ tablet Take by mouth.  . pramipexole (MIRAPEX) 1 MG tablet  Take 1 tablet (1 mg total) by mouth at bedtime.  . sertraline (ZOLOFT) 50 MG tablet Take 1.5 tablets (75 mg total) by mouth daily.  Marland Kitchen sulfamethoxazole-trimethoprim (BACTRIM DS,SEPTRA DS) 800-160 MG tablet Take 1 tablet by mouth daily.   Marland Kitchen torsemide (DEMADEX) 20 MG tablet Take 80 mg by mouth.  . torsemide (DEMADEX) 20 MG tablet TAKE FOUR TABLETS BY MOUTH TWICE DAILY    Past Medical History:  Diagnosis Date  . Asthma   . CHF (congestive heart failure) [facility])    hospital 11/2013-- [place]  . Chronic kidney disease   . Depression   . Diabetes mellitus ([place])   . GERD (gastroesophageal reflux disease)   . Hyperlipidemia   . Hypertension     Past Surgical History:  Procedure Laterality Date  . CHOLECYSTECTOMY  2002  . SPINE SURGERY  aug 2015   [facility]  . TONSILLECTOMY    . TUBAL LIGATION      Social History Social History  Substance Use Topics  . Smoking status: Former Smoker    Packs/day: 1.50    Years: 20.00    Types: Cigarettes    Quit date: 08/02/1984  . Smokeless tobacco: Never Used  . Alcohol use No    Family History Family History  Problem Relation Age of Onset  . Hypertension Mother   . Alzheimer's disease Mother   . Hyperlipidemia Mother   . Heart disease Father        cad  . Asthma Father   . Cancer Father 43       leukemia  . Stroke Father   . Hypertension Father   . Heart disease Sister 50       MI  . Pulmonary fibrosis Sister   . Arthritis Brother   . Heart disease Maternal Uncle   . Stroke Maternal Uncle   . Uterine cancer Paternal Grandmother   . Stomach cancer Paternal Grandfather   . Heart disease Maternal Uncle   . Heart disease Maternal Uncle   No family history of bleeding/clotting disorders, porphyria or autoimmune disease   No Known Allergies   REVIEW OF SYSTEMS (Negative unless checked)  Constitutional: [] Weight loss  [] Fever  [] Chills Cardiac: [] Chest pain   [] Chest pressure   [] Palpitations   [] Shortness of breath  when laying flat   [] Shortness of breath with exertion. Vascular:  [] Pain in legs with walking   [x] Pain in legs with standing  [x] History of venous ulcer   [] Phlebitis   [x] Swelling in legs   [x] Varicose veins   [] Non-healing ulcers Pulmonary:   [] Uses home oxygen   [] Productive cough   [] Hemoptysis   [] Wheeze  [] COPD   [] Asthma Neurologic:  [] Dizziness   [] Seizures   [] History of stroke   [] History of TIA  [] Aphasia   [] Vissual changes   [] Weakness or numbness in arm   [] Weakness or numbness in leg Musculoskeletal:   [] Joint swelling   [x] Joint pain   [x] Low back pain Hematologic:  [] Easy bruising  [] Easy bleeding   [] Hypercoagulable state   []   Anemic Gastrointestinal:  [] Diarrhea   [] Vomiting  [] Gastroesophageal reflux/heartburn   [] Difficulty swallowing. Genitourinary:  [] Chronic kidney disease   [] Difficult urination  [] Frequent urination   [] Blood in urine Skin:  [x] Rashes   [] Ulcers  Psychological:  [] History of anxiety   []  History of major depression.  Physical Examination  Vitals:   12/20/16 1520  BP: (!) 173/73  Pulse: 94  Resp: 17  Weight: 260 lb (117.9 kg)  Height: 5\' 4"  (1.626 m)   Body mass index is 44.63 kg/m. Gen: WD/WN, NAD Head: [place]/AT, No temporalis wasting.  Ear/Nose/Throat: Hearing grossly intact, nares w/o erythema or drainage, poor dentition Eyes: PER, EOMI, sclera nonicteric.  Neck: Supple, no masses.  No bruit or JVD.  Pulmonary:  Good air movement, clear to auscultation bilaterally, no use of accessory muscles.  Cardiac: RRR, normal S1, S2, no Murmurs. Vascular: Varicosities present extensively bilaterally.  Mild venous stasis changes to the legs bilaterally.  4+ soft pitting edema Vessel Right Left  Radial Palpable Palpable  PT Palpable Palpable  DP Palpable Palpable   Gastrointestinal: soft, non-distended. No guarding/no peritoneal signs.  Musculoskeletal: M/S 5/5 throughout.  No deformity or atrophy.  Neurologic: CN 2-12 intact. Pain and light touch  intact in extremities.  Symmetrical.  Speech is fluent. Motor exam as listed above. Psychiatric: Judgment intact, Mood & affect appropriate for pt's clinical situation. Dermatologic: Severe venous rashes but no ulcers noted.  No changes consistent with cellulitis. Lymph : No Cervical lymphadenopathy, mild lichenification associated with skin changes of chronic lymphedema.  CBC Lab Results  Component Value Date   WBC 16.2 (H) 10/14/2016   HGB 10.4 (L) 10/14/2016   HCT 32.0 (L) 10/14/2016   MCV 85.2 10/14/2016   PLT 72.0 (L) 10/14/2016    BMET    Component Value Date/Time   NA 139 10/18/2016 1005   K 3.6 10/18/2016 1005   CL 100 10/18/2016 1005   CO2 30 10/18/2016 1005   GLUCOSE 147 (H) 10/18/2016 1005   BUN 48 (H) 10/18/2016 1005   CREATININE 1.72 (H) 10/18/2016 1005   CREATININE 1.15 (H) 07/27/2012 1705   CALCIUM 9.3 10/18/2016 1005   GFRNONAA 48.09 03/03/2009 0847   GFRAA 59 10/16/2007 0000   CrCl cannot be calculated (Patient's most recent lab result is older than the maximum 21 days allowed.).  COAG No results found for: INR, PROTIME  Radiology No results found.   Assessment/Plan 1. Varicose veins of bilateral lower extremities with pain  Recommend:  The patient has large symptomatic varicose veins that are painful and associated with swelling.  I have had a long discussion with the patient regarding  varicose veins and why they cause symptoms.  Patient will begin wearing graduated compression stockings class 1 on a daily basis, beginning first thing in the morning and removing them in the evening. The patient is instructed specifically not to sleep in the stockings.    The patient  will also begin using over-the-counter analgesics such as Motrin 600 mg po TID to help control the symptoms.    In addition, behavioral modification including elevation during the day will be initiated.    Pending the results of these changes the  patient will be reevaluated in three  months.   An  ultrasound of the venous system shows bilateral superficial reflux.   Further plans will be based on whether conservative therapies are successful at eliminating the pain and swelling.   Left leg first  2. Chronic venous insufficiency No surgery or  intervention at this point in time.    I have had a long discussion with the patient regarding venous insufficiency and why it  causes symptoms. I have discussed with the patient the chronic skin changes that accompany venous insufficiency and the long term sequela such as infection and ulceration.  Patient will begin wearing graduated compression stockings class 1 (20-30 mmHg) or compression wraps on a daily basis a prescription was given. The patient will put the stockings on first thing in the morning and removing them in the evening. The patient is instructed specifically not to sleep in the stockings.    In addition, behavioral modification including several periods of elevation of the lower extremities during the day will be continued. I have demonstrated that proper elevation is a position with the ankles at heart level.  The patient is instructed to begin routine exercise, especially walking on a daily basis  Following the review of the ultrasound the patient will follow up in 2-3 months to reassess the degree of swelling and the control that graduated compression stockings or compression wraps  is offering.   The patient can be assessed for a Lymph Pump at that time  3. Lymphedema I have had a long discussion with the patient regarding swelling and why it  causes symptoms.  Patient will begin wearing graduated compression stockings class 1 (20-30 mmHg) on a daily basis a prescription was given. The patient will  beginning wearing the stockings first thing in the morning and removing them in the evening. The patient is instructed specifically not to sleep in the stockings.   In addition, behavioral modification will be initiated.   This will include frequent elevation, use of over the counter pain medications and exercise such as walking.  I have reviewed systemic causes for chronic edema such as liver, kidney and cardiac etiologies.  The patient denies problems with these organ systems.    Consideration for a lymph pump will also be made based upon the effectiveness of conservative therapy.  This would help to improve the edema control and prevent sequela such as ulcers and infections    4. Essential hypertension Continue antihypertensive medications as already ordered, these medications have been reviewed and there are no changes at this time.   5. Type 2 diabetes mellitus with diabetic nephropathy, with long-term current use of insulin ([place]) Continue hypoglycemic medications as already ordered, these medications have been reviewed and there are no changes at this time.  Hgb A1C to be monitored as already arranged by primary service     Hortencia Pilar, MD  12/20/2016 4:17 PM

## 2016-12-23 DIAGNOSIS — I129 Hypertensive chronic kidney disease with stage 1 through stage 4 chronic kidney disease, or unspecified chronic kidney disease: Secondary | ICD-10-CM | POA: Diagnosis not present

## 2016-12-23 DIAGNOSIS — N183 Chronic kidney disease, stage 3 (moderate): Secondary | ICD-10-CM | POA: Diagnosis not present

## 2016-12-24 DIAGNOSIS — E889 Metabolic disorder, unspecified: Secondary | ICD-10-CM | POA: Diagnosis not present

## 2016-12-24 DIAGNOSIS — Z8679 Personal history of other diseases of the circulatory system: Secondary | ICD-10-CM | POA: Diagnosis not present

## 2016-12-24 DIAGNOSIS — E1122 Type 2 diabetes mellitus with diabetic chronic kidney disease: Secondary | ICD-10-CM | POA: Diagnosis not present

## 2016-12-24 DIAGNOSIS — E8779 Other fluid overload: Secondary | ICD-10-CM | POA: Diagnosis not present

## 2016-12-24 DIAGNOSIS — N183 Chronic kidney disease, stage 3 (moderate): Secondary | ICD-10-CM | POA: Diagnosis not present

## 2016-12-24 DIAGNOSIS — D631 Anemia in chronic kidney disease: Secondary | ICD-10-CM | POA: Diagnosis not present

## 2016-12-24 DIAGNOSIS — I129 Hypertensive chronic kidney disease with stage 1 through stage 4 chronic kidney disease, or unspecified chronic kidney disease: Secondary | ICD-10-CM | POA: Diagnosis not present

## 2016-12-24 DIAGNOSIS — M9080 Osteopathy in diseases classified elsewhere, unspecified site: Secondary | ICD-10-CM | POA: Diagnosis not present

## 2016-12-24 DIAGNOSIS — E559 Vitamin D deficiency, unspecified: Secondary | ICD-10-CM | POA: Diagnosis not present

## 2016-12-27 ENCOUNTER — Encounter (HOSPITAL_COMMUNITY): Payer: Self-pay

## 2016-12-29 DIAGNOSIS — L0211 Cutaneous abscess of neck: Secondary | ICD-10-CM | POA: Diagnosis not present

## 2016-12-31 DIAGNOSIS — L0390 Cellulitis, unspecified: Secondary | ICD-10-CM | POA: Diagnosis not present

## 2016-12-31 DIAGNOSIS — L723 Sebaceous cyst: Secondary | ICD-10-CM | POA: Diagnosis not present

## 2016-12-31 LAB — CHROMOSOME ANALYSIS, BONE MARROW

## 2017-01-07 ENCOUNTER — Other Ambulatory Visit: Payer: Self-pay | Admitting: Cardiology

## 2017-01-07 NOTE — Telephone Encounter (Signed)
Rx(s) sent to pharmacy electronically.  

## 2017-01-11 ENCOUNTER — Encounter: Payer: Self-pay | Admitting: Family Medicine

## 2017-01-11 ENCOUNTER — Ambulatory Visit (INDEPENDENT_AMBULATORY_CARE_PROVIDER_SITE_OTHER): Payer: Medicare Other | Admitting: Family Medicine

## 2017-01-11 VITALS — BP 134/67 | HR 78 | Resp 17 | Ht 64.0 in | Wt 263.0 lb

## 2017-01-11 DIAGNOSIS — E1165 Type 2 diabetes mellitus with hyperglycemia: Secondary | ICD-10-CM | POA: Diagnosis not present

## 2017-01-11 DIAGNOSIS — Z23 Encounter for immunization: Secondary | ICD-10-CM

## 2017-01-11 DIAGNOSIS — I10 Essential (primary) hypertension: Secondary | ICD-10-CM | POA: Diagnosis not present

## 2017-01-11 DIAGNOSIS — E785 Hyperlipidemia, unspecified: Secondary | ICD-10-CM

## 2017-01-11 DIAGNOSIS — IMO0002 Reserved for concepts with insufficient information to code with codable children: Secondary | ICD-10-CM

## 2017-01-11 DIAGNOSIS — L02811 Cutaneous abscess of head [any part, except face]: Secondary | ICD-10-CM | POA: Diagnosis not present

## 2017-01-11 DIAGNOSIS — E1151 Type 2 diabetes mellitus with diabetic peripheral angiopathy without gangrene: Secondary | ICD-10-CM

## 2017-01-11 MED ORDER — DOXYCYCLINE HYCLATE 100 MG PO TABS: 100.0000 mg | ORAL_TABLET | Freq: Two times a day (BID) | ORAL | 0 refills | Status: DC

## 2017-01-11 NOTE — Assessment & Plan Note (Signed)
Well controlled, no changes to meds. Encouraged heart healthy diet such as the DASH diet and exercise as tolerated.  °

## 2017-01-11 NOTE — Progress Notes (Signed)
Patient ID: Erin [name], female    DOB: 1945/06/05  Age: 71 y.o. MRN: [id number]    Subjective:  Subjective  HPI Erin [name] presents for f/u dm, cholesterol and bp.  Pt seeing vascular surgeon for varicose veins and lymphedema.  She is seeing the oncologist for thrombocytopenia.   Pt c/o abscess on back of head and [facility] and they l[facility] it and put her on abx.  It was there 2 weeks before [facility].  She would like [place] to look at it .    HYPERTENSION   Blood pressure range-not checking   Chest pain- no      Dyspnea- no Lightheadedness- no   Edema- yes  Other side effects - no   Medication compliance: good Low salt diet- yes    DIABETES    Blood Sugar ranges-high per pt   Polyuria- no New Visual problems- no  Hypoglycemic symptoms- no  Other side effects-no Medication compliance - good Last eye exam- due Foot exam- today   HYPERLIPIDEMIA  Medication compliance- good RUQ pain- no  Muscle aches- no Other side effects-no    Review of Systems  Constitutional: Negative for activity change, appetite change, fatigue and unexpected weight change.  Respiratory: Negative for cough and shortness of breath.   Cardiovascular: Negative for chest pain and palpitations.  Psychiatric/Behavioral: Negative for behavioral problems and dysphoric mood. The patient is not nervous/anxious.     History Past Medical History:  Diagnosis Date  . Asthma   . CHF (congestive heart failure) [facility])    hospital 11/2013-- [place]  . Chronic kidney disease   . Depression   . Diabetes mellitus ([place])   . GERD (gastroesophageal reflux disease)   . Hyperlipidemia   . Hypertension     She has a past surgical history that includes Cholecystectomy (2002); Tubal ligation; Tonsillectomy; and Spine surgery (aug 2015).   Her family history includes Alzheimer's disease in her mother; Arthritis in her brother; Asthma in her father; Cancer (age of onset: 49) in her father; Heart disease in her  father, maternal uncle, maternal uncle, and maternal uncle; Heart disease (age of onset: 32) in her sister; Hyperlipidemia in her mother; Hypertension in her father and mother; Pulmonary fibrosis in her sister; Stomach cancer in her paternal grandfather; Stroke in her father and maternal uncle; Uterine cancer in her paternal grandmother.She reports that she quit smoking about 32 years ago. Her smoking use included Cigarettes. She has a 30.00 pack-year smoking history. She has never used smokeless tobacco. She reports that she does not drink alcohol or use drugs.  Current Outpatient Prescriptions on File Prior to Visit  Medication Sig Dispense Refill  . albuterol (PROVENTIL) (2.5 MG/3ML) 0.083% nebulizer solution Take 3 mLs (2.5 mg total) by nebulization every 6 (six) hours as needed for wheezing or shortness of breath. 75 mL 12  . allopurinol (ZYLOPRIM) 100 MG tablet Take 1 tablet by mouth 2 (two) times daily.    Marland Kitchen atorvastatin (LIPITOR) 40 MG tablet TAKE ONE TABLET BY MOUTH ONCE DAILY 90 tablet 1  . busPIRone (BUSPAR) 15 MG tablet TAKE 1 TABLET BY MOUTH THREE TIMES DAILY 90 tablet 0  . carvedilol (COREG) 6.25 MG tablet TAKE 1 TABLET BY MOUTH EVERY 12 HOURS 180 tablet 3  . doxycycline (VIBRAMYCIN) 100 MG capsule Take 1 capsule (100 mg total) by mouth 2 (two) times daily. Can give caps or generic 14 capsule 0  . ergocalciferol (VITAMIN D2) 50000 UNITS capsule Take 50,000  Units by mouth once a week.    . fenofibrate 160 MG tablet Take 1 tablet (160 mg total) by mouth daily. 30 tablet 2  . Ferrous Sulfate (IRON) 325 (65 FE) MG TABS Take 1 tablet by mouth 2 (two) times daily.    . Fluticasone Furoate-Vilanterol (BREO ELLIPTA) 200-25 MCG/INH AEPB Inhale 1 puff into the lungs daily.    . Fluticasone-Salmeterol (ADVAIR) 250-50 MCG/DOSE AEPB Inhale 1 puff into the lungs 2 (two) times daily.    Marland Kitchen gabapentin (NEURONTIN) 100 MG capsule TAKE 1 CAPSULE BY MOUTH AT BEDTIME 30 capsule 0  . glipiZIDE (GLUCOTROL) 10  MG tablet Take 1 tablet (10 mg total) by mouth 2 (two) times daily before a meal. 180 tablet 3  . insulin NPH-regular Human (NOVOLIN 70/30 RELION) (70-30) 100 UNIT/ML injection Inject under skin 15-30 min before b'fast and supper: 35-40 units 3x a day. ReliOn. 30 mL 5  . Insulin Syringe-Needle U-100 (B-D INS SYR ULTRAFINE .5CC/30G) 30G X 1/2" 0.5 ML MISC Use 3x a day 300 each 3  . metolazone (ZAROXOLYN) 5 MG tablet Take 1 tablet weekly 30 minutes prior to furosemide dose    . NOVOLIN 70/30 RELION (70-30) 100 UNIT/ML injection INJECT  SUBCUTANEOUSLY 15-30 MINUTES BEFORE BREAKFAST AND WITH SUPPER; 35-40 UNITS AS ADVISED 20 mL 5  . pantoprazole (PROTONIX) 40 MG tablet TAKE ONE TABLET BY MOUTH ONCE DAILY 90 tablet 1  . potassium chloride (K-DUR,KLOR-CON) 10 MEQ tablet Take by mouth.    . pramipexole (MIRAPEX) 1 MG tablet Take 1 tablet (1 mg total) by mouth at bedtime. 90 tablet 0  . sertraline (ZOLOFT) 50 MG tablet Take 1.5 tablets (75 mg total) by mouth daily. 135 tablet 3  . torsemide (DEMADEX) 20 MG tablet TAKE FOUR TABLETS BY MOUTH TWICE DAILY     No current facility-administered medications on file prior to visit.      Objective:  Objective  Physical Exam  Constitutional: She is oriented to person, place, and time. She appears well-developed and well-nourished.  HENT:  Head: Normocephalic and atraumatic.    Eyes: Conjunctivae and EOM are normal.  Neck: Normal range of motion. Neck supple. No JVD present. Carotid bruit is not present. No thyromegaly present.  Cardiovascular: Normal rate, regular rhythm and normal heart sounds.   No murmur heard. Pulmonary/Chest: Effort normal and breath sounds normal. No respiratory distress. She has no wheezes. She has no rales. She exhibits no tenderness.  Musculoskeletal: She exhibits edema.  Neurological: She is alert and oriented to person, place, and time.  Psychiatric: She has a normal mood and affect.  Nursing note and vitals reviewed. Sensory  exam of the foot is normal, tested with the monofilament. Good pulses, no lesions or ulcers, good peripheral pulses.  BP 134/67 (BP Location: Right Arm, Patient Position: Sitting, Cuff Size: Large)   Pulse 78   Resp 17   Ht 5\' 4"  (1.626 m)   Wt 263 lb (119.3 kg)   SpO2 92%   BMI 45.14 kg/m  Wt Readings from Last 3 Encounters:  01/11/17 263 lb (119.3 kg)  12/20/16 260 lb (117.9 kg)  11/23/16 263 lb (119.3 kg)     Lab Results  Component Value Date   WBC 16.2 (H) 10/14/2016   HGB 10.4 (L) 10/14/2016   HCT 32.0 (L) 10/14/2016   PLT 72.0 (L) 10/14/2016   GLUCOSE 147 (H) 10/18/2016   CHOL 102 10/18/2016   TRIG 184.0 (H) 10/18/2016   HDL 31.70 (L) 10/18/2016  LDLDIRECT 52.0 07/12/2016   LDLCALC 33 10/18/2016   ALT 19 10/18/2016   AST 25 10/18/2016   NA 139 10/18/2016   K 3.6 10/18/2016   CL 100 10/18/2016   CREATININE 1.72 (H) 10/18/2016   BUN 48 (H) 10/18/2016   CO2 30 10/18/2016   TSH 2.29 05/17/2011   HGBA1C 8.4 11/08/2016   MICROALBUR 2.7 (H) 01/06/2016    No results found.   Assessment & Plan:  Plan  I have discontinued Ms. Freels's sulfamethoxazole-trimethoprim and sulfamethoxazole-trimethoprim. I am also having her start on doxycycline. Additionally, I am having her maintain her ergocalciferol, Iron, fluticasone furoate-vilanterol, allopurinol, albuterol, glipiZIDE, metolazone, potassium chloride, Fluticasone-Salmeterol, sertraline, Insulin Syringe-Needle U-100, insulin NPH-regular Human, pantoprazole, atorvastatin, torsemide, doxycycline, fenofibrate, NOVOLIN 70/30 RELION, pramipexole, busPIRone, gabapentin, and carvedilol.  Meds ordered this encounter  Medications  . doxycycline (VIBRA-TABS) 100 MG tablet    Sig: Take 1 tablet (100 mg total) by mouth 2 (two) times daily.    Dispense:  14 tablet    Refill:  0    Problem List Items Addressed This Visit      Unprioritized   DM (diabetes mellitus) type II uncontrolled, periph vascular disorder ([place])     hgba1c to be checked, minimize simple carbs. Increase exercise as tolerated. Continue current meds       Relevant Orders   Hemoglobin A1c   Microalbumin / creatinine urine ratio   Comprehensive metabolic panel   Essential hypertension    Well controlled, no changes to meds. Encouraged heart healthy diet such as the DASH diet and exercise as tolerated.       Relevant Orders   Lipid panel   Hemoglobin A1c   Microalbumin / creatinine urine ratio   Comprehensive metabolic panel   Hyperlipidemia    Tolerating statin, encouraged heart healthy diet, avoid trans fats, minimize simple carbs and saturated fats. Increase exercise as tolerated       Other Visit Diagnoses    Need for influenza vaccination    -  Primary   Relevant Orders   Flu vaccine HIGH DOSE PF (Fluzone High dose)   Abscess, scalp       Relevant Medications   doxycycline (VIBRA-TABS) 100 MG tablet   Hyperlipidemia LDL goal <70       Relevant Orders   Lipid panel   Hemoglobin A1c   Microalbumin / creatinine urine ratio   Comprehensive metabolic panel      Follow-up: Return in about 6 months (around 07/12/2017) for hypertension, hyperlipidemia, diabetes II.  Ann Held, DO

## 2017-01-11 NOTE — Patient Instructions (Signed)

## 2017-01-11 NOTE — Assessment & Plan Note (Signed)
hgba1c to be checked, minimize simple carbs. Increase exercise as tolerated. Continue current meds  

## 2017-01-11 NOTE — Assessment & Plan Note (Signed)
Tolerating statin, encouraged heart healthy diet, avoid trans fats, minimize simple carbs and saturated fats. Increase exercise as tolerated 

## 2017-01-25 ENCOUNTER — Other Ambulatory Visit (INDEPENDENT_AMBULATORY_CARE_PROVIDER_SITE_OTHER): Payer: Medicare Other

## 2017-01-25 DIAGNOSIS — E1165 Type 2 diabetes mellitus with hyperglycemia: Secondary | ICD-10-CM | POA: Diagnosis not present

## 2017-01-25 DIAGNOSIS — E785 Hyperlipidemia, unspecified: Secondary | ICD-10-CM

## 2017-01-25 DIAGNOSIS — I10 Essential (primary) hypertension: Secondary | ICD-10-CM | POA: Diagnosis not present

## 2017-01-25 DIAGNOSIS — E1151 Type 2 diabetes mellitus with diabetic peripheral angiopathy without gangrene: Secondary | ICD-10-CM

## 2017-01-25 DIAGNOSIS — IMO0002 Reserved for concepts with insufficient information to code with codable children: Secondary | ICD-10-CM

## 2017-01-25 LAB — COMPREHENSIVE METABOLIC PANEL
ALT: 16 U/L (ref 0–35)
AST: 25 U/L (ref 0–37)
Albumin: 3.8 g/dL (ref 3.5–5.2)
Alkaline Phosphatase: 81 U/L (ref 39–117)
BUN: 38 mg/dL — ABNORMAL HIGH (ref 6–23)
CO2: 31 mEq/L (ref 19–32)
Calcium: 9.4 mg/dL (ref 8.4–10.5)
Chloride: 97 mEq/L (ref 96–112)
Creatinine, Ser: 1.17 mg/dL (ref 0.40–1.20)
GFR: 48.36 mL/min — ABNORMAL LOW (ref >60.00)
Glucose, Bld: 240 mg/dL — ABNORMAL HIGH (ref 70–99)
Potassium: 3.5 mEq/L (ref 3.5–5.1)
Sodium: 137 mEq/L (ref 135–145)
Total Bilirubin: 0.5 mg/dL (ref 0.2–1.2)
Total Protein: 7.7 g/dL (ref 6.0–8.3)

## 2017-01-25 LAB — LDL CHOLESTEROL, DIRECT: Direct LDL: 55 mg/dL

## 2017-01-25 LAB — LIPID PANEL
Cholesterol: 126 mg/dL (ref 0–200)
HDL: 33.3 mg/dL — ABNORMAL LOW (ref >39.00)
NonHDL: 92.27
Total CHOL/HDL Ratio: 4
Triglycerides: 264 mg/dL — ABNORMAL HIGH (ref 0.0–149.0)
VLDL: 52.8 mg/dL — ABNORMAL HIGH (ref 0.0–40.0)

## 2017-01-25 LAB — HEMOGLOBIN A1C: Hgb A1c MFr Bld: 10.6 % — ABNORMAL HIGH (ref 4.6–6.5)

## 2017-02-01 ENCOUNTER — Other Ambulatory Visit: Payer: Self-pay

## 2017-02-01 DIAGNOSIS — Z9889 Other specified postprocedural states: Secondary | ICD-10-CM | POA: Diagnosis not present

## 2017-02-01 DIAGNOSIS — K1321 Leukoplakia of oral mucosa, including tongue: Secondary | ICD-10-CM | POA: Diagnosis not present

## 2017-02-01 DIAGNOSIS — Z87891 Personal history of nicotine dependence: Secondary | ICD-10-CM | POA: Diagnosis not present

## 2017-02-01 DIAGNOSIS — K135 Oral submucous fibrosis: Secondary | ICD-10-CM | POA: Diagnosis not present

## 2017-02-01 NOTE — Telephone Encounter (Signed)
Pt notified of recent lab results and advised to come in in 3 months to have labs repeated. Per note, it states Pt should add fenofibrate, however I see the Rx has been in Pt's chart. Advised to take meds as Rx'd and foloow up as need. Pt stated compliance.

## 2017-02-07 ENCOUNTER — Other Ambulatory Visit: Payer: Self-pay | Admitting: Family Medicine

## 2017-02-07 ENCOUNTER — Telehealth: Payer: Self-pay | Admitting: Family Medicine

## 2017-02-07 DIAGNOSIS — Z23 Encounter for immunization: Secondary | ICD-10-CM | POA: Diagnosis not present

## 2017-02-07 DIAGNOSIS — J209 Acute bronchitis, unspecified: Secondary | ICD-10-CM | POA: Diagnosis not present

## 2017-02-07 DIAGNOSIS — D7581 Myelofibrosis: Secondary | ICD-10-CM | POA: Diagnosis not present

## 2017-02-07 NOTE — Telephone Encounter (Signed)
Patient is confused about advice given to take a cholesterol medication. Patient states that she has not been given directions or a medication. Please advise.

## 2017-02-08 ENCOUNTER — Ambulatory Visit: Payer: Self-pay | Admitting: Internal Medicine

## 2017-02-09 ENCOUNTER — Other Ambulatory Visit: Payer: Self-pay | Admitting: Family Medicine

## 2017-02-09 DIAGNOSIS — E785 Hyperlipidemia, unspecified: Secondary | ICD-10-CM

## 2017-02-09 MED ORDER — FENOFIBRATE 160 MG PO TABS: 160.0000 mg | ORAL_TABLET | Freq: Every day | ORAL | 2 refills | Status: DC

## 2017-02-10 NOTE — Telephone Encounter (Signed)
Pt states that yesterday someone called from our office stating that there had been a change in her medications and something new had been added. Uopn review I only see refills of Rx she's had for a few years ( Lipitor and Fenofibrate). No calls noted on chart. Pt states she has both medications on hand and is taking everything as prescribed.

## 2017-02-11 ENCOUNTER — Encounter: Payer: Self-pay | Admitting: Internal Medicine

## 2017-02-11 ENCOUNTER — Ambulatory Visit (INDEPENDENT_AMBULATORY_CARE_PROVIDER_SITE_OTHER): Payer: Medicare Other | Admitting: Internal Medicine

## 2017-02-11 VITALS — BP 142/82 | HR 79 | Temp 97.6°F | Wt 252.2 lb

## 2017-02-11 DIAGNOSIS — IMO0002 Reserved for concepts with insufficient information to code with codable children: Secondary | ICD-10-CM

## 2017-02-11 DIAGNOSIS — E1165 Type 2 diabetes mellitus with hyperglycemia: Secondary | ICD-10-CM | POA: Diagnosis not present

## 2017-02-11 DIAGNOSIS — E1151 Type 2 diabetes mellitus with diabetic peripheral angiopathy without gangrene: Secondary | ICD-10-CM

## 2017-02-11 MED ORDER — INSULIN NPH ISOPHANE & REGULAR (70-30) 100 UNIT/ML ~~LOC~~ SUSP: SUBCUTANEOUS | 5 refills | Status: DC

## 2017-02-11 NOTE — Patient Instructions (Addendum)
Please continue: - Glipizide 10 mg 2x a day before meals.  Please increase: - Novolin ReliOn 70/30 - 15-30 min before meals - 40 units before Breakfast - 40 units before Lunch  - 45 units before Dinner  Please work on [employer].  Please return in 2 months with your sugar log.

## 2017-02-11 NOTE — Progress Notes (Signed)
Patient ID: Erin [name], female   DOB: Mar 19, 1946, 71 y.o.   MRN: [id number]  HPI: Erin [name] is a 71 y.o.-year-old female, returning for f/u for DM2, dx 2013, insulin-dependent, uncontrolled, with complications (CKD). Last visit 3 mo ago.  She has an URI.  Last hemoglobin A1c was: Lab Results  Component Value Date   HGBA1C 10.6 (H) 01/25/2017   HGBA1C 8.4 11/08/2016   HGBA1C 12.4 07/23/2016   HGBA1C 11.2 Repeated and verified X2. (H) 01/06/2016   HGBA1C 8.4 05/01/2015   HGBA1C 9.0 (H) 12/10/2014   HGBA1C 5.8 01/11/2014   HGBA1C 6.8 (H) 08/02/2013   HGBA1C 7.1 (H) 07/05/2013   HGBA1C 7.2 (H) 01/17/2013   Pt was on a regimen of: - Glipizide 10 mg bid - Lantus 27 units in am  Now on (doughnut hole) - Glipizide 10 mg 2x a day before meals - Novolin ReliOn 70/30 - 15-30 min before b'fast and supper  - missed doses - 35 units before B and L - 40 units before D She had to stop Tradjenta 5 mg for 1 year b/c price >> restarted now but still very expensive.  Had to stop Metformin 2/2 AKI after her back sx.  Pt was checking her sugars 1-2x a day - meter broke now. From last visit: - am:  270s >> 150-200 >> 150-190s, some 200s  - 2h after b'fast: 300s >> 185-220 >> n/c - before lunch:n/c >> 200-244 >> n/c - 2h after lunch: 300s-400 >> 207-244 >> n/c - before dinner: n/c  >> 280-350 >> 200-300 >> 200s - 2h after dinner: 1187-259, 280 >> n/c >> 290s >> n/c - bedtime: 95-126 >> n/c  Lowest sugar was 150 >> ? ; she has hypoglycemia awareness at 70.  Highest sugar was 400s >> 300s >> ?.  Meals: - Breakfast: eggs + toast; occas. grits - Lunch: sandwich, soup - Dinner: meat + veggies + starch or sandwich - Snacks: grapes, icecream  - + CKD, last BUN/creatinine:  Lab Results  Component Value Date   BUN 38 (H) 01/25/2017   CREATININE 1.17 01/25/2017  11/30/2013 [facility] - Cr 1.8  On Lisinopril. - + HL; last set of lipids: Lab Results  Component Value Date    CHOL 126 01/25/2017   HDL 33.30 (L) 01/25/2017   LDLCALC 33 10/18/2016   LDLDIRECT 55.0 01/25/2017   TRIG 264.0 (H) 01/25/2017   CHOLHDL 4 01/25/2017  On Lipitor. - last eye exam was in 06/2016 >> No DR. She has a h/o Cataracts. She had R cataract sx. - + numbness and tingling in her  big toe, not in feet  She also has a history of COPD, depression, vit D def.   ROS: Constitutional: + weight loss, no fatigue, no subjective hyperthermia, no subjective hypothermia Eyes: no blurry vision, no xerophthalmia ENT: no sore throat, no nodules palpated in throat, no dysphagia, no odynophagia, no hoarseness Cardiovascular: no CP/no SOB/no palpitations/no leg swelling Respiratory: no cough/no SOB/no wheezing Gastrointestinal: no N/no V/+ D/no C/no acid reflux Musculoskeletal: no muscle aches/no joint aches Skin: no rashes, no hair loss Neurological: no tremors/no dizziness  I reviewed pt's medications, allergies, PMH, social hx, family hx, and changes were documented in the history of present illness. Otherwise, unchanged from my initial visit note.  PE: BP (!) 142/82   Pulse 79   Temp 97.6 F (36.4 C) (Oral)   Wt 252 lb 4 oz (114.4 kg)   SpO2 94%   BMI 43.30  kg/m  Body mass index is 43.3 kg/m. Wt Readings from Last 3 Encounters:  02/11/17 252 lb 4 oz (114.4 kg)  01/11/17 263 lb (119.3 kg)  12/20/16 260 lb (117.9 kg)   Constitutional: overweight, in NAD Eyes: PERRLA, EOMI, no exophthalmos ENT: moist mucous membranes, no thyromegaly, no cervical lymphadenopathy Cardiovascular: RRR, No MRG, + B LE edema Respiratory: CTA B Gastrointestinal: abdomen soft, NT, ND, BS+ Musculoskeletal: no deformities, strength intact in all 4 Skin: moist, warm, no rashes Neurological: no tremor with outstretched hands, DTR normal in all 4  ASSESSMENT: 1. DM2, insulin-dependent, uncontrolled, with complications - CKD  2. Obesity class 3 BMI Classification:  < 18.5 underweight   18.5-24.9  normal weight   25.0-29.9 overweight   30.0-34.9 class I obesity   35.0-39.9 class II obesity   ? 40.0 class III obesity   PLAN:  1. Patient with long-standing, uncontrolled DM, on sulfonylurea and premixed insulin with improved control at last visit (however, the decrease HbA1c could have been influenced by her iron infusion at that time). She adopted a more-plant based diet and this could have contributed, also. She was still eating a lot of icecream and bread and sugars were still spiking then. - reviewed HbA1c from <1 mo ago: 10.6% (increased by >2%)! - at this visit, se is not checking sugars as meter broken >> she will get a new one - we will increase all the insulin doses slightly, but I cannot make further changes w/o more data - at next visit, we may need to change to basal-bolus insulin regimen with N and R - we also discussed about the absolute need to improve her diet - I advised her to:  Patient Instructions  Please continue: - Glipizide 10 mg 2x a day before meals.  Please increase: - Novolin ReliOn 70/30 - 15-30 min before meals - 40 units before Breakfast - 40 units before Lunch  - 45 units before Dinner  Please work on [employer].  Please return in 2 months with your sugar log.    - continue checking sugars at different times of the day - check 2-3x a day, rotating checks - advised for yearly eye exams >> she is UTD - UTD with flu shot and PNA vaccine - Return to clinic in 3 mo with sugar log    2. Obesity class 3 - BMI >40 - I suggested specific dietary changes at last visit >> did not start - she s happy she lost weight, but I suspect glucotoxicity and explained that this is not a healthy weight loss  Philemon Kingdom, MD PhD [facility]

## 2017-02-21 ENCOUNTER — Other Ambulatory Visit: Payer: Self-pay | Admitting: Family Medicine

## 2017-02-21 MED ORDER — BUSPIRONE HCL 15 MG PO TABS: 15.0000 mg | ORAL_TABLET | Freq: Three times a day (TID) | ORAL | 1 refills | Status: DC

## 2017-02-21 NOTE — Telephone Encounter (Signed)
°  Relation to YF:[employer] Call back [phone]  Reason for call:  Patient requesting busPIRone (BUSPAR) 15 MG tablet please send to new pharmacy   [id number] - [place], [place] - [address] [phone] (Phone) [phone] (Fax)

## 2017-02-21 NOTE — Telephone Encounter (Signed)
Sent to pharmacy. Called the patient informed sent in.

## 2017-02-23 ENCOUNTER — Telehealth: Payer: Self-pay

## 2017-02-23 ENCOUNTER — Other Ambulatory Visit: Payer: Self-pay

## 2017-02-23 MED ORDER — INSULIN NPH ISOPHANE & REGULAR (70-30) 100 UNIT/ML ~~LOC~~ SUSP: SUBCUTANEOUS | 5 refills | Status: DC

## 2017-02-23 NOTE — Telephone Encounter (Signed)
Can she get it from another pharmacy?  Changing to something else could be complicated (she will need to take N and R insulin separately), as insurance likely covers Novolin and not Humulin. Can we call this into a [facility]?

## 2017-02-23 NOTE — Telephone Encounter (Signed)
[facility] 70/30 patient is requesting a change to something else- patient would like a call as soon as possible so that she does not run out

## 2017-02-28 ENCOUNTER — Telehealth: Payer: Self-pay

## 2017-02-28 NOTE — Telephone Encounter (Signed)
[facility] she needed and she was able to go pick it up. Please disregard pt prior request for it to be called in.

## 2017-02-28 NOTE — Telephone Encounter (Signed)
Called advised of MD note. Patient will call back and let us know.

## 2017-02-28 NOTE — Telephone Encounter (Signed)
Can you please cal this patient back with Dr. Arman Filter advice

## 2017-02-28 NOTE — Telephone Encounter (Signed)
Called patient and advised of MD note. Patient will call back if okay to send to [facility].

## 2017-03-01 ENCOUNTER — Other Ambulatory Visit: Payer: Self-pay

## 2017-03-01 ENCOUNTER — Telehealth: Payer: Self-pay | Admitting: Internal Medicine

## 2017-03-01 ENCOUNTER — Telehealth: Payer: Self-pay | Admitting: Family Medicine

## 2017-03-01 DIAGNOSIS — K219 Gastro-esophageal reflux disease without esophagitis: Secondary | ICD-10-CM

## 2017-03-01 MED ORDER — PANTOPRAZOLE SODIUM 40 MG PO TBEC: 40.0000 mg | DELAYED_RELEASE_TABLET | Freq: Every day | ORAL | 1 refills | Status: DC

## 2017-03-01 MED ORDER — "INSULIN SYRINGE-NEEDLE U-100 30G X 1/2"" 0.5 ML MISC": 3 refills | Status: DC

## 2017-03-01 NOTE — Telephone Encounter (Signed)
Copied from [place]. Topic: Quick Communication - Rx Refill/Question >> Mar 01, 2017 11:58 AM Scherrie Gerlach wrote: Has the patient contacted their pharmacy? {no  pt wants to switch pharmacies  pantoprazole (PROTONIX) 40 MG tablet 30 day w/ refills   Preferred Pharmacy (with phone number or street name): [id number] - [place], [address] [phone] (Phone) [phone] (Fax)     Agent: Please be advised that RX refills may take up to 48 hours. We ask that you follow-up with your pharmacy.

## 2017-03-01 NOTE — Telephone Encounter (Signed)
[facility] [address] [facility] [place]

## 2017-03-01 NOTE — Telephone Encounter (Signed)
Sent to pharmacy 

## 2017-03-02 ENCOUNTER — Telehealth: Payer: Self-pay | Admitting: *Deleted

## 2017-03-02 NOTE — Telephone Encounter (Signed)
[facility] [place] [place]   faxed over medication buspirone 15mg  stating that is on manufacture backorder and would like to change?

## 2017-03-03 ENCOUNTER — Other Ambulatory Visit: Payer: Self-pay

## 2017-03-03 MED ORDER — ESCITALOPRAM OXALATE 10 MG PO TABS: 10.0000 mg | ORAL_TABLET | Freq: Every day | ORAL | 2 refills | Status: DC

## 2017-03-03 NOTE — Telephone Encounter (Signed)
Patient calling back to see what the office called her about a few minutes ago. Please call pt

## 2017-03-03 NOTE — Telephone Encounter (Signed)
Do they have the 7.5 mg ---  Then she can take 2?

## 2017-03-03 NOTE — Telephone Encounter (Signed)
Try lexapro 10 mg #30  1 po qd , 2 refill s

## 2017-03-03 NOTE — Telephone Encounter (Signed)
Spoke with Pt a second time. She stated that she was able to pick up her Buspar at [facility] since [facility].. Pt states that she has at least a 30 day supply. Advised her to continue to take her Buspar but if at any point she runs out and cannot get refilled she can make the switch over to Paxil at that time. Pt stated compliance.

## 2017-03-03 NOTE — Telephone Encounter (Signed)
Called Pt and LM for her to call office back. Rx sent in as advised. Spoke with Pt advising her of change

## 2017-03-03 NOTE — Telephone Encounter (Signed)
[facility]  Would you like to change medication?

## 2017-03-17 ENCOUNTER — Encounter (INDEPENDENT_AMBULATORY_CARE_PROVIDER_SITE_OTHER): Payer: Self-pay | Admitting: Vascular Surgery

## 2017-03-17 ENCOUNTER — Ambulatory Visit (INDEPENDENT_AMBULATORY_CARE_PROVIDER_SITE_OTHER): Payer: Medicare Other | Admitting: Vascular Surgery

## 2017-03-17 VITALS — BP 188/80 | HR 94 | Resp 22 | Ht 64.5 in | Wt 268.0 lb

## 2017-03-17 DIAGNOSIS — I83813 Varicose veins of bilateral lower extremities with pain: Secondary | ICD-10-CM

## 2017-03-17 DIAGNOSIS — E785 Hyperlipidemia, unspecified: Secondary | ICD-10-CM | POA: Diagnosis not present

## 2017-03-17 DIAGNOSIS — I10 Essential (primary) hypertension: Secondary | ICD-10-CM | POA: Diagnosis not present

## 2017-03-17 DIAGNOSIS — I890 Lymphedema, not elsewhere classified: Secondary | ICD-10-CM

## 2017-03-17 DIAGNOSIS — J42 Unspecified chronic bronchitis: Secondary | ICD-10-CM | POA: Diagnosis not present

## 2017-03-17 DIAGNOSIS — I872 Venous insufficiency (chronic) (peripheral): Secondary | ICD-10-CM

## 2017-03-17 NOTE — Progress Notes (Signed)
MRN : [id number]  Erin [name] is a 71 y.o. (October 31, 1945) female who presents with chief complaint of  Chief Complaint  Patient presents with  . Follow-up    2-3 months no studies  .  History of Present Illness: The patient returns to the office for followup evaluation regarding leg swelling.  The swelling has persisted and the pain associated with swelling continues. There have not been any interval development of a ulcerations or wounds.  Since the previous visit the patient has been wearing graduated compression stockings and has noted little if any improvement in the lymphedema.  She c/o the stockings pinching her and cutting into her calf. The patient has been using compression routinely morning until night.  The patient also states elevation during the day and exercise is being done too.   Current Meds  Medication Sig  . albuterol (PROVENTIL) (2.5 MG/3ML) 0.083% nebulizer solution Take 3 mLs (2.5 mg total) by nebulization every 6 (six) hours as needed for wheezing or shortness of breath.  . allopurinol (ZYLOPRIM) 100 MG tablet Take 1 tablet by mouth 2 (two) times daily.  Marland Kitchen atorvastatin (LIPITOR) 40 MG tablet TAKE 1 TABLET BY MOUTH ONCE DAILY  . busPIRone (BUSPAR) 15 MG tablet Take 1 tablet (15 mg total) 3 (three) times daily by mouth.  . carvedilol (COREG) 6.25 MG tablet TAKE 1 TABLET BY MOUTH EVERY 12 HOURS  . doxycycline (VIBRA-TABS) 100 MG tablet Take 1 tablet (100 mg total) by mouth 2 (two) times daily.  Marland Kitchen doxycycline (VIBRAMYCIN) 100 MG capsule Take 1 capsule (100 mg total) by mouth 2 (two) times daily. Can give caps or generic  . ergocalciferol (VITAMIN D2) 50000 UNITS capsule Take 50,000 Units by mouth once a week.  . escitalopram (LEXAPRO) 10 MG tablet Take 1 tablet (10 mg total) by mouth daily.  Marland Kitchen escitalopram (LEXAPRO) 10 MG tablet Take 1 tablet (10 mg total) by mouth daily.  . fenofibrate 160 MG tablet Take 1 tablet (160 mg total) daily by mouth.  . ferrous  sulfate 325 (65 FE) MG tablet Take 325 mg by mouth.  . Fluticasone Furoate-Vilanterol (BREO ELLIPTA) 200-25 MCG/INH AEPB Inhale 1 puff into the lungs daily.  . Fluticasone-Salmeterol (ADVAIR) 250-50 MCG/DOSE AEPB Inhale 1 puff into the lungs 2 (two) times daily.  Marland Kitchen gabapentin (NEURONTIN) 100 MG capsule TAKE 1 CAPSULE BY MOUTH AT BEDTIME  . glipiZIDE (GLUCOTROL) 10 MG tablet Take 1 tablet (10 mg total) by mouth 2 (two) times daily before a meal.  . insulin NPH-regular Human (NOVOLIN 70/30 RELION) (70-30) 100 UNIT/ML injection Inject under skin 15-30 min before b'fast and supper: 40-45 units 3x a day. ReliOn.  . Insulin Syringe-Needle U-100 (B-D INS SYR ULTRAFINE .5CC/30G) 30G X 1/2" 0.5 ML MISC Use 3x a day  . metolazone (ZAROXOLYN) 5 MG tablet Take 1 tablet weekly 30 minutes prior to furosemide dose  . pantoprazole (PROTONIX) 40 MG tablet Take 1 tablet (40 mg total) by mouth daily.  . potassium chloride (K-DUR,KLOR-CON) 10 MEQ tablet Take by mouth.  . pramipexole (MIRAPEX) 1 MG tablet Take 1 tablet (1 mg total) by mouth at bedtime.  . sertraline (ZOLOFT) 50 MG tablet Take 1.5 tablets (75 mg total) by mouth daily.  Marland Kitchen torsemide (DEMADEX) 20 MG tablet TAKE FOUR TABLETS BY MOUTH TWICE DAILY    Past Medical History:  Diagnosis Date  . Asthma   . CHF (congestive heart failure) [facility])    hospital 11/2013-- [place]  . Chronic kidney  disease   . Depression   . Diabetes mellitus ([place])   . GERD (gastroesophageal reflux disease)   . Hyperlipidemia   . Hypertension     Past Surgical History:  Procedure Laterality Date  . CHOLECYSTECTOMY  2002  . SPINE SURGERY  aug 2015   [facility]  . TONSILLECTOMY    . TUBAL LIGATION      Social History Social History   Tobacco Use  . Smoking status: Former Smoker    Packs/day: 1.50    Years: 20.00    Pack years: 30.00    Types: Cigarettes    Last attempt to quit: 08/02/1984    Years since quitting: 32.6  . Smokeless tobacco: Never Used    Substance Use Topics  . Alcohol use: No  . Drug use: No    Family History Family History  Problem Relation Age of Onset  . Hypertension Mother   . Alzheimer's disease Mother   . Hyperlipidemia Mother   . Heart disease Father        cad  . Asthma Father   . Cancer Father 28       leukemia  . Stroke Father   . Hypertension Father   . Heart disease Sister 58       MI  . Pulmonary fibrosis Sister   . Arthritis Brother   . Heart disease Maternal Uncle   . Stroke Maternal Uncle   . Uterine cancer Paternal Grandmother   . Stomach cancer Paternal Grandfather   . Heart disease Maternal Uncle   . Heart disease Maternal Uncle     No Known Allergies   REVIEW OF SYSTEMS (Negative unless checked)  Constitutional: [] Weight loss  [] Fever  [] Chills Cardiac: [] Chest pain   [] Chest pressure   [] Palpitations   [] Shortness of breath when laying flat   [] Shortness of breath with exertion. Vascular:  [] Pain in legs with walking   [x] Pain in legs with standing  [] History of DVT   [] Phlebitis   [x] Swelling in legs   [] Varicose veins   [] Non-healing ulcers Pulmonary:   [] Uses home oxygen   [] Productive cough   [] Hemoptysis   [] Wheeze  [] COPD   [] Asthma Neurologic:  [] Dizziness   [] Seizures   [] History of stroke   [] History of TIA  [] Aphasia   [] Vissual changes   [] Weakness or numbness in arm   [] Weakness or numbness in leg Musculoskeletal:   [] Joint swelling   [x] Joint pain   [] Low back pain Hematologic:  [] Easy bruising  [] Easy bleeding   [] Hypercoagulable state   [] Anemic Gastrointestinal:  [] Diarrhea   [] Vomiting  [] Gastroesophageal reflux/heartburn   [] Difficulty swallowing. Genitourinary:  [] Chronic kidney disease   [] Difficult urination  [] Frequent urination   [] Blood in urine Skin:  [] Rashes   [] Ulcers  Psychological:  [] History of anxiety   []  History of major depression.  Physical Examination  Vitals:   03/17/17 1359  BP: (!) 188/80  Pulse: 94  Resp: (!) 22  Weight: 121.6 kg  (268 lb)  Height: 5' 4.5" (1.638 m)   Body mass index is 45.29 kg/m. Gen: WD/WN, NAD Head: [place]/AT, No temporalis wasting.  Ear/Nose/Throat: Hearing grossly intact, nares w/o erythema or drainage Eyes: PER, EOMI, sclera nonicteric.  Neck: Supple, no large masses.   Pulmonary:  Good air movement, no audible wheezing bilaterally, no use of accessory muscles.  Cardiac: RRR, no JVD Vascular:  Scattered small varicosities present bilaterally.  Mild venous stasis changes to the legs bilaterally.  3-4+ soft pitting  edema Vessel Right Left  Radial Palpable Palpable  PT Palpable Palpable  DP Palpable Palpable  Gastrointestinal: Non-distended. No guarding/no peritoneal signs.  Musculoskeletal: M/S 5/5 throughout.  No deformity or atrophy.  Neurologic: CN 2-12 intact. Symmetrical.  Speech is fluent. Motor exam as listed above. Psychiatric: Judgment intact, Mood & affect appropriate for pt's clinical situation. Dermatologic: No rashes or ulcers noted.  No changes consistent with cellulitis. Lymph : No lichenification or skin changes of chronic lymphedema.  CBC Lab Results  Component Value Date   WBC 16.2 (H) 10/14/2016   HGB 10.4 (L) 10/14/2016   HCT 32.0 (L) 10/14/2016   MCV 85.2 10/14/2016   PLT 72.0 (L) 10/14/2016    BMET    Component Value Date/Time   NA 137 01/25/2017 1020   K 3.5 01/25/2017 1020   CL 97 01/25/2017 1020   CO2 31 01/25/2017 1020   GLUCOSE 240 (H) 01/25/2017 1020   BUN 38 (H) 01/25/2017 1020   CREATININE 1.17 01/25/2017 1020   CREATININE 1.15 (H) 07/27/2012 1705   CALCIUM 9.4 01/25/2017 1020   GFRNONAA 48.09 03/03/2009 0847   GFRAA 59 10/16/2007 0000   CrCl cannot be calculated (Patient's most recent lab result is older than the maximum 21 days allowed.).  COAG No results found for: INR, PROTIME  Radiology No results found.   Assessment/Plan 1. Chronic venous insufficiency Recommend:  No surgery or intervention at this point in time.    I have  reviewed my previous discussion with the patient regarding swelling and why it causes symptoms.  Patient will continue wearing graduated compression compression wrap CircAid class 1 (20-30 mmHg) on a daily basis. The patient will  beginning wearing the wraps first thing in the morning and removing them in the evening. The patient is instructed specifically not to sleep in the stockings.    In addition, behavioral modification including several periods of elevation of the lower extremities during the day will be continued.  This was reviewed with the patient during the initial visit.  The patient will also continue routine exercise, especially walking on a daily basis as was discussed during the initial visit.    Despite conservative treatments including graduated compression therapy class 1 and behavioral modification including exercise and elevation the patient  has not obtained adequate control of the lymphedema.  The patient still has stage 3 lymphedema and therefore, I believe that a lymph pump should be added to improve the control of the patient's lymphedema.  Additionally, a lymph pump is warranted because it will reduce the risk of cellulitis and ulceration in the future.  Patient should follow-up in six months    2.  Lymphedema Recommend:  No surgery or intervention at this point in time.    I have reviewed my previous discussion with the patient regarding swelling and why it causes symptoms.  Patient will begin wearing graduated compression wraps CircAid class 1 (20-30 mmHg) on a daily basis. The patient will  beginning wearing the wraps first thing in the morning and removing them in the evening. The patient is instructed specifically not to sleep in the stockings.    In addition, behavioral modification including several periods of elevation of the lower extremities during the day will be continued.  This was reviewed with the patient during the initial visit.  The patient will also  continue routine exercise, especially walking on a daily basis as was discussed during the initial visit.    Despite conservative treatments including graduated compression  therapy class 1 and behavioral modification including exercise and elevation the patient  has not obtained adequate control of the lymphedema.  The patient still has stage 3 lymphedema and therefore, I believe that a lymph pump should be added to improve the control of the patient's lymphedema.  Additionally, a lymph pump is warranted because it will reduce the risk of cellulitis and ulceration in the future.  Patient should follow-up in six months    3. Essential hypertension Continue antihypertensive medications as already ordered, these medications have been reviewed and there are no changes at this time.   4. Hyperlipidemia, unspecified hyperlipidemia type Continue statin as ordered and reviewed, no changes at this time   5. Chronic bronchitis, unspecified chronic bronchitis type ([place]) Continue pulmonary medications and aerosols as already ordered, these medications have been reviewed and there are no changes at this time.     Hortencia Pilar, MD  03/17/2017 2:02 PM

## 2017-03-21 ENCOUNTER — Other Ambulatory Visit: Payer: Self-pay | Admitting: *Deleted

## 2017-03-21 MED ORDER — PRAMIPEXOLE DIHYDROCHLORIDE 1 MG PO TABS: 1.0000 mg | ORAL_TABLET | Freq: Every day | ORAL | 0 refills | Status: DC

## 2017-04-11 DIAGNOSIS — E1122 Type 2 diabetes mellitus with diabetic chronic kidney disease: Secondary | ICD-10-CM | POA: Diagnosis not present

## 2017-04-11 DIAGNOSIS — I129 Hypertensive chronic kidney disease with stage 1 through stage 4 chronic kidney disease, or unspecified chronic kidney disease: Secondary | ICD-10-CM | POA: Diagnosis not present

## 2017-04-11 DIAGNOSIS — E559 Vitamin D deficiency, unspecified: Secondary | ICD-10-CM | POA: Diagnosis not present

## 2017-04-11 DIAGNOSIS — N183 Chronic kidney disease, stage 3 (moderate): Secondary | ICD-10-CM | POA: Diagnosis not present

## 2017-04-11 DIAGNOSIS — E889 Metabolic disorder, unspecified: Secondary | ICD-10-CM | POA: Diagnosis not present

## 2017-04-11 DIAGNOSIS — M9080 Osteopathy in diseases classified elsewhere, unspecified site: Secondary | ICD-10-CM | POA: Diagnosis not present

## 2017-04-11 DIAGNOSIS — D631 Anemia in chronic kidney disease: Secondary | ICD-10-CM | POA: Diagnosis not present

## 2017-04-14 DIAGNOSIS — I639 Cerebral infarction, unspecified: Secondary | ICD-10-CM | POA: Diagnosis not present

## 2017-04-14 DIAGNOSIS — S40811A Abrasion of right upper arm, initial encounter: Secondary | ICD-10-CM | POA: Diagnosis not present

## 2017-04-14 DIAGNOSIS — R4182 Altered mental status, unspecified: Secondary | ICD-10-CM | POA: Diagnosis not present

## 2017-04-14 DIAGNOSIS — I619 Nontraumatic intracerebral hemorrhage, unspecified: Secondary | ICD-10-CM | POA: Diagnosis not present

## 2017-04-14 DIAGNOSIS — I6789 Other cerebrovascular disease: Secondary | ICD-10-CM | POA: Diagnosis not present

## 2017-04-14 DIAGNOSIS — R4781 Slurred speech: Secondary | ICD-10-CM | POA: Diagnosis not present

## 2017-04-14 DIAGNOSIS — X58XXXA Exposure to other specified factors, initial encounter: Secondary | ICD-10-CM | POA: Diagnosis not present

## 2017-04-14 DIAGNOSIS — R29810 Facial weakness: Secondary | ICD-10-CM | POA: Diagnosis not present

## 2017-04-14 DIAGNOSIS — Y998 Other external cause status: Secondary | ICD-10-CM | POA: Diagnosis not present

## 2017-04-15 ENCOUNTER — Ambulatory Visit: Payer: Self-pay | Admitting: Internal Medicine

## 2017-04-15 DIAGNOSIS — K219 Gastro-esophageal reflux disease without esophagitis: Secondary | ICD-10-CM | POA: Diagnosis not present

## 2017-04-15 DIAGNOSIS — R29810 Facial weakness: Secondary | ICD-10-CM | POA: Diagnosis present

## 2017-04-15 DIAGNOSIS — I5030 Unspecified diastolic (congestive) heart failure: Secondary | ICD-10-CM | POA: Diagnosis not present

## 2017-04-15 DIAGNOSIS — I5032 Chronic diastolic (congestive) heart failure: Secondary | ICD-10-CM | POA: Diagnosis not present

## 2017-04-15 DIAGNOSIS — M6281 Muscle weakness (generalized): Secondary | ICD-10-CM | POA: Diagnosis not present

## 2017-04-15 DIAGNOSIS — R4182 Altered mental status, unspecified: Secondary | ICD-10-CM | POA: Diagnosis not present

## 2017-04-15 DIAGNOSIS — I739 Peripheral vascular disease, unspecified: Secondary | ICD-10-CM | POA: Diagnosis not present

## 2017-04-15 DIAGNOSIS — R009 Unspecified abnormalities of heart beat: Secondary | ICD-10-CM | POA: Diagnosis not present

## 2017-04-15 DIAGNOSIS — R410 Disorientation, unspecified: Secondary | ICD-10-CM | POA: Diagnosis not present

## 2017-04-15 DIAGNOSIS — J449 Chronic obstructive pulmonary disease, unspecified: Secondary | ICD-10-CM | POA: Diagnosis present

## 2017-04-15 DIAGNOSIS — G4733 Obstructive sleep apnea (adult) (pediatric): Secondary | ICD-10-CM | POA: Diagnosis not present

## 2017-04-15 DIAGNOSIS — I110 Hypertensive heart disease with heart failure: Secondary | ICD-10-CM | POA: Diagnosis not present

## 2017-04-15 DIAGNOSIS — D473 Essential (hemorrhagic) thrombocythemia: Secondary | ICD-10-CM | POA: Diagnosis present

## 2017-04-15 DIAGNOSIS — I313 Pericardial effusion (noninflammatory): Secondary | ICD-10-CM | POA: Diagnosis not present

## 2017-04-15 DIAGNOSIS — I5033 Acute on chronic diastolic (congestive) heart failure: Secondary | ICD-10-CM | POA: Diagnosis present

## 2017-04-15 DIAGNOSIS — E119 Type 2 diabetes mellitus without complications: Secondary | ICD-10-CM | POA: Diagnosis not present

## 2017-04-15 DIAGNOSIS — E1165 Type 2 diabetes mellitus with hyperglycemia: Secondary | ICD-10-CM | POA: Diagnosis not present

## 2017-04-15 DIAGNOSIS — I4510 Unspecified right bundle-branch block: Secondary | ICD-10-CM | POA: Diagnosis not present

## 2017-04-15 DIAGNOSIS — E1142 Type 2 diabetes mellitus with diabetic polyneuropathy: Secondary | ICD-10-CM | POA: Diagnosis not present

## 2017-04-15 DIAGNOSIS — N289 Disorder of kidney and ureter, unspecified: Secondary | ICD-10-CM | POA: Diagnosis not present

## 2017-04-15 DIAGNOSIS — N390 Urinary tract infection, site not specified: Secondary | ICD-10-CM | POA: Diagnosis not present

## 2017-04-15 DIAGNOSIS — I69351 Hemiplegia and hemiparesis following cerebral infarction affecting right dominant side: Secondary | ICD-10-CM | POA: Diagnosis not present

## 2017-04-15 DIAGNOSIS — I63512 Cerebral infarction due to unspecified occlusion or stenosis of left middle cerebral artery: Secondary | ICD-10-CM | POA: Diagnosis not present

## 2017-04-15 DIAGNOSIS — E1151 Type 2 diabetes mellitus with diabetic peripheral angiopathy without gangrene: Secondary | ICD-10-CM | POA: Diagnosis present

## 2017-04-15 DIAGNOSIS — J810 Acute pulmonary edema: Secondary | ICD-10-CM | POA: Diagnosis not present

## 2017-04-15 DIAGNOSIS — Z794 Long term (current) use of insulin: Secondary | ICD-10-CM | POA: Diagnosis not present

## 2017-04-15 DIAGNOSIS — R2689 Other abnormalities of gait and mobility: Secondary | ICD-10-CM | POA: Diagnosis not present

## 2017-04-15 DIAGNOSIS — R609 Edema, unspecified: Secondary | ICD-10-CM | POA: Diagnosis not present

## 2017-04-15 DIAGNOSIS — N183 Chronic kidney disease, stage 3 (moderate): Secondary | ICD-10-CM | POA: Diagnosis not present

## 2017-04-15 DIAGNOSIS — E785 Hyperlipidemia, unspecified: Secondary | ICD-10-CM | POA: Diagnosis not present

## 2017-04-15 DIAGNOSIS — E8881 Metabolic syndrome: Secondary | ICD-10-CM | POA: Diagnosis not present

## 2017-04-15 DIAGNOSIS — R5383 Other fatigue: Secondary | ICD-10-CM | POA: Diagnosis not present

## 2017-04-15 DIAGNOSIS — Z9989 Dependence on other enabling machines and devices: Secondary | ICD-10-CM | POA: Diagnosis not present

## 2017-04-15 DIAGNOSIS — L03116 Cellulitis of left lower limb: Secondary | ICD-10-CM | POA: Diagnosis not present

## 2017-04-15 DIAGNOSIS — Z8679 Personal history of other diseases of the circulatory system: Secondary | ICD-10-CM | POA: Diagnosis not present

## 2017-04-15 DIAGNOSIS — E669 Obesity, unspecified: Secondary | ICD-10-CM | POA: Diagnosis present

## 2017-04-15 DIAGNOSIS — R2681 Unsteadiness on feet: Secondary | ICD-10-CM | POA: Diagnosis not present

## 2017-04-15 DIAGNOSIS — I10 Essential (primary) hypertension: Secondary | ICD-10-CM | POA: Diagnosis not present

## 2017-04-15 DIAGNOSIS — E781 Pure hyperglyceridemia: Secondary | ICD-10-CM | POA: Diagnosis not present

## 2017-04-15 DIAGNOSIS — E8770 Fluid overload, unspecified: Secondary | ICD-10-CM | POA: Diagnosis not present

## 2017-04-15 DIAGNOSIS — E1140 Type 2 diabetes mellitus with diabetic neuropathy, unspecified: Secondary | ICD-10-CM | POA: Diagnosis not present

## 2017-04-15 DIAGNOSIS — L03115 Cellulitis of right lower limb: Secondary | ICD-10-CM | POA: Diagnosis not present

## 2017-04-15 DIAGNOSIS — Z7409 Other reduced mobility: Secondary | ICD-10-CM | POA: Diagnosis not present

## 2017-04-15 DIAGNOSIS — I63412 Cerebral infarction due to embolism of left middle cerebral artery: Secondary | ICD-10-CM | POA: Diagnosis not present

## 2017-04-15 DIAGNOSIS — D7581 Myelofibrosis: Secondary | ICD-10-CM | POA: Diagnosis present

## 2017-04-15 DIAGNOSIS — I517 Cardiomegaly: Secondary | ICD-10-CM | POA: Diagnosis not present

## 2017-04-15 DIAGNOSIS — E1122 Type 2 diabetes mellitus with diabetic chronic kidney disease: Secondary | ICD-10-CM | POA: Diagnosis not present

## 2017-04-15 DIAGNOSIS — R4701 Aphasia: Secondary | ICD-10-CM | POA: Diagnosis present

## 2017-04-15 DIAGNOSIS — R140 Abdominal distension (gaseous): Secondary | ICD-10-CM | POA: Diagnosis not present

## 2017-04-15 DIAGNOSIS — I639 Cerebral infarction, unspecified: Secondary | ICD-10-CM | POA: Diagnosis not present

## 2017-04-15 DIAGNOSIS — R0603 Acute respiratory distress: Secondary | ICD-10-CM | POA: Diagnosis not present

## 2017-04-15 DIAGNOSIS — R162 Hepatomegaly with splenomegaly, not elsewhere classified: Secondary | ICD-10-CM | POA: Diagnosis not present

## 2017-04-15 DIAGNOSIS — N179 Acute kidney failure, unspecified: Secondary | ICD-10-CM | POA: Diagnosis not present

## 2017-04-15 DIAGNOSIS — J9601 Acute respiratory failure with hypoxia: Secondary | ICD-10-CM | POA: Diagnosis not present

## 2017-04-15 DIAGNOSIS — M109 Gout, unspecified: Secondary | ICD-10-CM | POA: Diagnosis not present

## 2017-04-15 DIAGNOSIS — I69320 Aphasia following cerebral infarction: Secondary | ICD-10-CM | POA: Diagnosis not present

## 2017-04-15 DIAGNOSIS — J42 Unspecified chronic bronchitis: Secondary | ICD-10-CM | POA: Diagnosis not present

## 2017-04-15 DIAGNOSIS — I130 Hypertensive heart and chronic kidney disease with heart failure and stage 1 through stage 4 chronic kidney disease, or unspecified chronic kidney disease: Secondary | ICD-10-CM | POA: Diagnosis not present

## 2017-04-15 DIAGNOSIS — I129 Hypertensive chronic kidney disease with stage 1 through stage 4 chronic kidney disease, or unspecified chronic kidney disease: Secondary | ICD-10-CM | POA: Diagnosis not present

## 2017-04-15 DIAGNOSIS — K529 Noninfective gastroenteritis and colitis, unspecified: Secondary | ICD-10-CM | POA: Diagnosis not present

## 2017-04-15 DIAGNOSIS — D119 Benign neoplasm of major salivary gland, unspecified: Secondary | ICD-10-CM | POA: Diagnosis not present

## 2017-04-15 DIAGNOSIS — R278 Other lack of coordination: Secondary | ICD-10-CM | POA: Diagnosis not present

## 2017-04-15 DIAGNOSIS — R400 Somnolence: Secondary | ICD-10-CM | POA: Diagnosis not present

## 2017-04-15 DIAGNOSIS — D631 Anemia in chronic kidney disease: Secondary | ICD-10-CM | POA: Diagnosis present

## 2017-04-15 DIAGNOSIS — D72829 Elevated white blood cell count, unspecified: Secondary | ICD-10-CM | POA: Diagnosis not present

## 2017-04-15 DIAGNOSIS — N2889 Other specified disorders of kidney and ureter: Secondary | ICD-10-CM | POA: Diagnosis not present

## 2017-04-15 DIAGNOSIS — J45909 Unspecified asthma, uncomplicated: Secondary | ICD-10-CM | POA: Diagnosis not present

## 2017-04-15 DIAGNOSIS — G8191 Hemiplegia, unspecified affecting right dominant side: Secondary | ICD-10-CM | POA: Diagnosis present

## 2017-04-25 DIAGNOSIS — E1122 Type 2 diabetes mellitus with diabetic chronic kidney disease: Secondary | ICD-10-CM | POA: Diagnosis not present

## 2017-04-25 DIAGNOSIS — R5383 Other fatigue: Secondary | ICD-10-CM | POA: Diagnosis not present

## 2017-04-25 DIAGNOSIS — I5030 Unspecified diastolic (congestive) heart failure: Secondary | ICD-10-CM | POA: Diagnosis not present

## 2017-04-25 DIAGNOSIS — R2681 Unsteadiness on feet: Secondary | ICD-10-CM | POA: Diagnosis not present

## 2017-04-25 DIAGNOSIS — E889 Metabolic disorder, unspecified: Secondary | ICD-10-CM | POA: Diagnosis not present

## 2017-04-25 DIAGNOSIS — N2889 Other specified disorders of kidney and ureter: Secondary | ICD-10-CM | POA: Diagnosis not present

## 2017-04-25 DIAGNOSIS — E8881 Metabolic syndrome: Secondary | ICD-10-CM | POA: Diagnosis not present

## 2017-04-25 DIAGNOSIS — I63512 Cerebral infarction due to unspecified occlusion or stenosis of left middle cerebral artery: Secondary | ICD-10-CM | POA: Diagnosis not present

## 2017-04-25 DIAGNOSIS — E1140 Type 2 diabetes mellitus with diabetic neuropathy, unspecified: Secondary | ICD-10-CM | POA: Diagnosis not present

## 2017-04-25 DIAGNOSIS — M6281 Muscle weakness (generalized): Secondary | ICD-10-CM | POA: Diagnosis not present

## 2017-04-25 DIAGNOSIS — R400 Somnolence: Secondary | ICD-10-CM | POA: Diagnosis not present

## 2017-04-25 DIAGNOSIS — I69351 Hemiplegia and hemiparesis following cerebral infarction affecting right dominant side: Secondary | ICD-10-CM | POA: Diagnosis not present

## 2017-04-25 DIAGNOSIS — D72829 Elevated white blood cell count, unspecified: Secondary | ICD-10-CM | POA: Diagnosis not present

## 2017-04-25 DIAGNOSIS — Z794 Long term (current) use of insulin: Secondary | ICD-10-CM | POA: Diagnosis not present

## 2017-04-25 DIAGNOSIS — G2581 Restless legs syndrome: Secondary | ICD-10-CM | POA: Diagnosis not present

## 2017-04-25 DIAGNOSIS — L03116 Cellulitis of left lower limb: Secondary | ICD-10-CM | POA: Diagnosis not present

## 2017-04-25 DIAGNOSIS — I69320 Aphasia following cerebral infarction: Secondary | ICD-10-CM | POA: Diagnosis not present

## 2017-04-25 DIAGNOSIS — R4701 Aphasia: Secondary | ICD-10-CM | POA: Diagnosis not present

## 2017-04-25 DIAGNOSIS — Z9989 Dependence on other enabling machines and devices: Secondary | ICD-10-CM | POA: Diagnosis not present

## 2017-04-25 DIAGNOSIS — I10 Essential (primary) hypertension: Secondary | ICD-10-CM | POA: Diagnosis not present

## 2017-04-25 DIAGNOSIS — Z79899 Other long term (current) drug therapy: Secondary | ICD-10-CM | POA: Diagnosis not present

## 2017-04-25 DIAGNOSIS — J42 Unspecified chronic bronchitis: Secondary | ICD-10-CM | POA: Diagnosis not present

## 2017-04-25 DIAGNOSIS — I6990 Unspecified sequelae of unspecified cerebrovascular disease: Secondary | ICD-10-CM | POA: Diagnosis not present

## 2017-04-25 DIAGNOSIS — R2689 Other abnormalities of gait and mobility: Secondary | ICD-10-CM | POA: Diagnosis not present

## 2017-04-25 DIAGNOSIS — R278 Other lack of coordination: Secondary | ICD-10-CM | POA: Diagnosis not present

## 2017-04-25 DIAGNOSIS — I129 Hypertensive chronic kidney disease with stage 1 through stage 4 chronic kidney disease, or unspecified chronic kidney disease: Secondary | ICD-10-CM | POA: Diagnosis not present

## 2017-04-25 DIAGNOSIS — E119 Type 2 diabetes mellitus without complications: Secondary | ICD-10-CM | POA: Diagnosis not present

## 2017-04-25 DIAGNOSIS — E118 Type 2 diabetes mellitus with unspecified complications: Secondary | ICD-10-CM | POA: Diagnosis not present

## 2017-04-25 DIAGNOSIS — M79675 Pain in left toe(s): Secondary | ICD-10-CM | POA: Diagnosis not present

## 2017-04-25 DIAGNOSIS — M79672 Pain in left foot: Secondary | ICD-10-CM | POA: Diagnosis not present

## 2017-04-25 DIAGNOSIS — D631 Anemia in chronic kidney disease: Secondary | ICD-10-CM | POA: Diagnosis not present

## 2017-04-25 DIAGNOSIS — B351 Tinea unguium: Secondary | ICD-10-CM | POA: Diagnosis not present

## 2017-04-25 DIAGNOSIS — R410 Disorientation, unspecified: Secondary | ICD-10-CM | POA: Diagnosis not present

## 2017-04-25 DIAGNOSIS — M9080 Osteopathy in diseases classified elsewhere, unspecified site: Secondary | ICD-10-CM | POA: Diagnosis not present

## 2017-04-25 DIAGNOSIS — E785 Hyperlipidemia, unspecified: Secondary | ICD-10-CM | POA: Diagnosis not present

## 2017-04-25 DIAGNOSIS — G4733 Obstructive sleep apnea (adult) (pediatric): Secondary | ICD-10-CM | POA: Diagnosis not present

## 2017-04-25 DIAGNOSIS — L03115 Cellulitis of right lower limb: Secondary | ICD-10-CM | POA: Diagnosis not present

## 2017-04-25 DIAGNOSIS — I5032 Chronic diastolic (congestive) heart failure: Secondary | ICD-10-CM | POA: Diagnosis not present

## 2017-04-25 DIAGNOSIS — I639 Cerebral infarction, unspecified: Secondary | ICD-10-CM | POA: Diagnosis not present

## 2017-04-25 DIAGNOSIS — N390 Urinary tract infection, site not specified: Secondary | ICD-10-CM | POA: Diagnosis not present

## 2017-04-25 DIAGNOSIS — E559 Vitamin D deficiency, unspecified: Secondary | ICD-10-CM | POA: Diagnosis not present

## 2017-04-25 DIAGNOSIS — D696 Thrombocytopenia, unspecified: Secondary | ICD-10-CM | POA: Diagnosis not present

## 2017-04-25 DIAGNOSIS — E1165 Type 2 diabetes mellitus with hyperglycemia: Secondary | ICD-10-CM | POA: Diagnosis not present

## 2017-04-25 DIAGNOSIS — I110 Hypertensive heart disease with heart failure: Secondary | ICD-10-CM | POA: Diagnosis not present

## 2017-04-25 DIAGNOSIS — M109 Gout, unspecified: Secondary | ICD-10-CM | POA: Diagnosis not present

## 2017-04-25 DIAGNOSIS — I739 Peripheral vascular disease, unspecified: Secondary | ICD-10-CM | POA: Diagnosis not present

## 2017-04-25 DIAGNOSIS — E669 Obesity, unspecified: Secondary | ICD-10-CM | POA: Diagnosis not present

## 2017-04-25 DIAGNOSIS — N183 Chronic kidney disease, stage 3 (moderate): Secondary | ICD-10-CM | POA: Diagnosis not present

## 2017-04-25 DIAGNOSIS — Z7409 Other reduced mobility: Secondary | ICD-10-CM | POA: Diagnosis not present

## 2017-04-25 MED ORDER — MELATONIN 3 MG PO TABS
3.00 | ORAL_TABLET | ORAL | Status: DC
Start: 2017-04-25 — End: 2017-04-25

## 2017-04-25 MED ORDER — SENNOSIDES-DOCUSATE SODIUM 8.6-50 MG PO TABS
1.00 | ORAL_TABLET | ORAL | Status: DC
Start: 2017-04-25 — End: 2017-04-25

## 2017-04-25 MED ORDER — BUSPIRONE HCL 10 MG PO TABS
15.00 | ORAL_TABLET | ORAL | Status: DC
Start: 2017-04-25 — End: 2017-04-25

## 2017-04-25 MED ORDER — ACETAMINOPHEN 325 MG PO TABS
650.00 | ORAL_TABLET | ORAL | Status: DC
Start: ? — End: 2017-04-25

## 2017-04-25 MED ORDER — ONDANSETRON 4 MG PO TBDP
4.00 | ORAL_TABLET | ORAL | Status: DC
Start: ? — End: 2017-04-25

## 2017-04-25 MED ORDER — HEPARIN SODIUM (PORCINE) 5000 UNIT/ML IJ SOLN
5000.00 | INTRAMUSCULAR | Status: DC
Start: 2017-04-25 — End: 2017-04-25

## 2017-04-25 MED ORDER — GENERIC EXTERNAL MEDICATION
75.00 | Status: DC
Start: 2017-04-26 — End: 2017-04-25

## 2017-04-25 MED ORDER — BUDESONIDE 0.5 MG/2ML IN SUSP
0.50 | RESPIRATORY_TRACT | Status: DC
Start: 2017-04-25 — End: 2017-04-25

## 2017-04-25 MED ORDER — PRAMIPEXOLE DIHYDROCHLORIDE 1 MG PO TABS
1.00 | ORAL_TABLET | ORAL | Status: DC
Start: 2017-04-25 — End: 2017-04-25

## 2017-04-25 MED ORDER — ARFORMOTEROL TARTRATE 15 MCG/2ML IN NEBU
15.00 | INHALATION_SOLUTION | RESPIRATORY_TRACT | Status: DC
Start: 2017-04-25 — End: 2017-04-25

## 2017-04-25 MED ORDER — ALLOPURINOL 100 MG PO TABS
100.00 | ORAL_TABLET | ORAL | Status: DC
Start: 2017-04-26 — End: 2017-04-25

## 2017-04-25 MED ORDER — GENERIC EXTERNAL MEDICATION
45.00 | Status: DC
Start: 2017-04-25 — End: 2017-04-25

## 2017-04-25 MED ORDER — PANTOPRAZOLE SODIUM 40 MG PO TBEC
40.00 | DELAYED_RELEASE_TABLET | ORAL | Status: DC
Start: 2017-04-26 — End: 2017-04-25

## 2017-04-25 MED ORDER — FENOFIBRATE 48 MG PO TABS
160.00 | ORAL_TABLET | ORAL | Status: DC
Start: 2017-04-26 — End: 2017-04-25

## 2017-04-25 MED ORDER — INSULIN LISPRO 100 UNIT/ML ~~LOC~~ SOLN
2.00 | SUBCUTANEOUS | Status: DC
Start: 2017-04-25 — End: 2017-04-25

## 2017-04-25 MED ORDER — CARVEDILOL 3.125 MG PO TABS
6.25 | ORAL_TABLET | ORAL | Status: DC
Start: 2017-04-25 — End: 2017-04-25

## 2017-04-25 MED ORDER — ROSUVASTATIN CALCIUM 20 MG PO TABS
20.00 | ORAL_TABLET | ORAL | Status: DC
Start: 2017-04-25 — End: 2017-04-25

## 2017-04-25 MED ORDER — POLYETHYLENE GLYCOL 3350 17 G PO PACK
17.00 g | PACK | ORAL | Status: DC
Start: 2017-04-26 — End: 2017-04-25

## 2017-04-25 MED ORDER — VITAMIN D (ERGOCALCIFEROL) 1.25 MG (50000 UT) PO CAPS
50000.00 | ORAL_CAPSULE | ORAL | Status: DC
Start: 2017-05-02 — End: 2017-04-25

## 2017-04-25 MED ORDER — QUETIAPINE FUMARATE 25 MG PO TABS
12.50 | ORAL_TABLET | ORAL | Status: DC
Start: 2017-04-25 — End: 2017-04-25

## 2017-04-25 MED ORDER — CLOPIDOGREL BISULFATE 75 MG PO TABS
75.00 | ORAL_TABLET | ORAL | Status: DC
Start: 2017-04-26 — End: 2017-04-25

## 2017-04-25 MED ORDER — ALBUTEROL SULFATE (2.5 MG/3ML) 0.083% IN NEBU
2.50 | INHALATION_SOLUTION | RESPIRATORY_TRACT | Status: DC
Start: ? — End: 2017-04-25

## 2017-04-25 MED ORDER — INSULIN LISPRO 100 UNIT/ML ~~LOC~~ SOLN
12.00 | SUBCUTANEOUS | Status: DC
Start: 2017-04-25 — End: 2017-04-25

## 2017-04-25 MED ORDER — DEXTROSE 10 % IV SOLN
125.00 | INTRAVENOUS | Status: DC
Start: ? — End: 2017-04-25

## 2017-04-25 MED ORDER — TORSEMIDE 10 MG PO TABS
20.00 | ORAL_TABLET | ORAL | Status: DC
Start: 2017-04-25 — End: 2017-04-25

## 2017-04-29 DIAGNOSIS — E118 Type 2 diabetes mellitus with unspecified complications: Secondary | ICD-10-CM | POA: Diagnosis not present

## 2017-04-29 DIAGNOSIS — I6990 Unspecified sequelae of unspecified cerebrovascular disease: Secondary | ICD-10-CM | POA: Diagnosis not present

## 2017-04-29 DIAGNOSIS — I110 Hypertensive heart disease with heart failure: Secondary | ICD-10-CM | POA: Diagnosis not present

## 2017-04-29 DIAGNOSIS — M109 Gout, unspecified: Secondary | ICD-10-CM | POA: Diagnosis not present

## 2017-05-02 DIAGNOSIS — E118 Type 2 diabetes mellitus with unspecified complications: Secondary | ICD-10-CM | POA: Diagnosis not present

## 2017-05-03 ENCOUNTER — Telehealth: Payer: Self-pay | Admitting: *Deleted

## 2017-05-03 NOTE — Telephone Encounter (Signed)
Copied from [place]. Topic: Referral - Request >> May 02, 2017  8:44 AM Ahmed Prima L wrote: Pt's daughter called and stated that she had stroke on 1/10. She is currently at [facility]. She wants to know if she can be referred to a neurologist somewhere in [place]. Call back is [phone] Marcie Bal)

## 2017-05-04 NOTE — Telephone Encounter (Signed)
Would [facility] do that for her?

## 2017-05-04 NOTE — Telephone Encounter (Signed)
I would think they could but if they can not ok to refer to neuro

## 2017-05-04 NOTE — Telephone Encounter (Signed)
Left detailed message on machine for daughter to call back.  [facility] should be able to refer to where ever they like.  If they do not we can send in referral.

## 2017-05-05 DIAGNOSIS — D631 Anemia in chronic kidney disease: Secondary | ICD-10-CM | POA: Diagnosis not present

## 2017-05-05 DIAGNOSIS — E889 Metabolic disorder, unspecified: Secondary | ICD-10-CM | POA: Diagnosis not present

## 2017-05-05 DIAGNOSIS — E1122 Type 2 diabetes mellitus with diabetic chronic kidney disease: Secondary | ICD-10-CM | POA: Diagnosis not present

## 2017-05-05 DIAGNOSIS — I129 Hypertensive chronic kidney disease with stage 1 through stage 4 chronic kidney disease, or unspecified chronic kidney disease: Secondary | ICD-10-CM | POA: Diagnosis not present

## 2017-05-05 DIAGNOSIS — E559 Vitamin D deficiency, unspecified: Secondary | ICD-10-CM | POA: Diagnosis not present

## 2017-05-05 DIAGNOSIS — M9080 Osteopathy in diseases classified elsewhere, unspecified site: Secondary | ICD-10-CM | POA: Diagnosis not present

## 2017-05-05 DIAGNOSIS — N183 Chronic kidney disease, stage 3 (moderate): Secondary | ICD-10-CM | POA: Diagnosis not present

## 2017-05-12 ENCOUNTER — Other Ambulatory Visit: Payer: Self-pay

## 2017-05-16 ENCOUNTER — Encounter: Payer: Self-pay | Admitting: Neurology

## 2017-05-16 ENCOUNTER — Encounter (INDEPENDENT_AMBULATORY_CARE_PROVIDER_SITE_OTHER): Payer: Self-pay

## 2017-05-16 ENCOUNTER — Ambulatory Visit (INDEPENDENT_AMBULATORY_CARE_PROVIDER_SITE_OTHER): Payer: Medicare Other | Admitting: Neurology

## 2017-05-16 VITALS — BP 126/51 | HR 74 | Wt 248.9 lb

## 2017-05-16 DIAGNOSIS — I639 Cerebral infarction, unspecified: Secondary | ICD-10-CM

## 2017-05-16 NOTE — Progress Notes (Signed)
[facility] [address] [place]. [place] [address] [phone]       OFFICE CONSULT NOTE  [name] Date of Birth:  1945-09-10 Medical Record Number:  [id number]   Referring MD: Martie Lee Reason for Referral:  Stroke HPI: Erin [name] is a pleasant 72 year old Caucasian lady who is accompanied today by her daughter who provides most of the history. I have also reviewed patient's recent hospitalization records at [facility] as well as [facility] in care everywhere. I was unable to locate actual imaging films . She developed sudden onset of aphasia and right hemiplegia and was taken to [facility]. CT scan of the head showed slight hemorrhagic transformation into large left MCA infarct. NIH stroke scale was 14. She was outside the time window for thrombolysis and was not felt to be candidate for mechanical thrombectomy. She was admitted at [facility] where carotid ultrasound and transcranial Doppler studies were obtained which were unremarkable. Transthoracic echo showed ejection fraction of 65% with some mild diastolic dysfunction but no evidence of patent foramen ovale or cardiac source of embolism. Telemetry monitoring showed normal sinus rhythm. Hemoglobin A1c was elevated at 11.8 and LDL cholesterol was 53 mg percent. She was seen by physical occupational speech therapy and transferred for ongoing rehabilitation needs to a skilled nursing facility and under county. She is up in improvement in her right-sided strength and is able to walk with a cane and can ambulate even up to 200 feet. She does get short of breath due to her CHF and that is more of limiting factor in a walking. Her speech and language have improved but she still has significant trouble with understanding and speaking. The patient is tolerating Plavix well without bleeding or bruising. Her blood pressure is usually well  controlled . She is getting physical occupational and speech therapy. Family is planning to bring her home soon and she will live with her son initially at least until she recovers and is able to live by herself. ROS:   14 system review of systems is positive for  leg swelling, shortness of breath, confusion, speech difficulties and all other systems negative PMH:  Past Medical History:  Diagnosis Date  . Asthma   . CHF (congestive heart failure) [facility])    [place] 11/2013[place]  . Chronic kidney disease   . Depression   . Diabetes mellitus ([place])   . GERD (gastroesophageal reflux disease)   . Hyperlipidemia   . Hypertension   . Stroke [facility])     Social History:  Social History   Socioeconomic History  . Marital status: Widowed    Spouse name: Not on file  . Number of children: 4  . Years of education: Not on file  . Highest education level: Not on file  Social Needs  . Financial resource strain: Not on file  . Food insecurity - worry: Not on file  . Food insecurity - inability: Not on file  . Transportation needs - medical: Not on file  . Transportation needs - non-medical: Not on file  Occupational History  . Occupation: [employer] @ [place]: [employer]  Tobacco Use  . Smoking status: Former Smoker    Packs/day: 1.50    Years: 20.00    Pack years: 30.00    Types: Cigarettes    Last attempt to quit: 08/02/1984    Years since quitting: 32.8  .  Smokeless tobacco: Never Used  Substance and Sexual Activity  . Alcohol use: No  . Drug use: No  . Sexual activity: Not Currently    Partners: Male  Other Topics Concern  . Not on file  Social History Narrative   NO REG EXERCISE   Caffeine use: daily    Medications:   Current Outpatient Medications on File Prior to Visit  Medication Sig Dispense Refill  . Acetaminophen (TYLENOL 8 HOUR PO) Take 500 mg by mouth.    Marland Kitchen albuterol (PROVENTIL) (2.5 MG/3ML) 0.083% nebulizer solution Take 3 mLs (2.5 mg total)  by nebulization every 6 (six) hours as needed for wheezing or shortness of breath. 75 mL 12  . albuterol (VENTOLIN HFA) 108 (90 Base) MCG/ACT inhaler Inhale into the lungs.    Marland Kitchen allopurinol (ZYLOPRIM) 100 MG tablet Take 1 tablet by mouth 2 (two) times daily.    Marland Kitchen atorvastatin (LIPITOR) 40 MG tablet TAKE 1 TABLET BY MOUTH ONCE DAILY 90 tablet 1  . busPIRone (BUSPAR) 15 MG tablet Take 1 tablet (15 mg total) 3 (three) times daily by mouth. 90 tablet 1  . carvedilol (COREG) 6.25 MG tablet TAKE 1 TABLET BY MOUTH EVERY 12 HOURS 180 tablet 3  . clopidogrel (PLAVIX) 75 MG tablet Take by mouth.    . ergocalciferol (VITAMIN D2) 50000 UNITS capsule Take 50,000 Units by mouth once a week.    . escitalopram (LEXAPRO) 10 MG tablet Take 1 tablet (10 mg total) by mouth daily. 30 tablet 2  . fenofibrate 160 MG tablet Take 1 tablet (160 mg total) daily by mouth. 90 tablet 2  . ferrous sulfate 325 (65 FE) MG tablet Take 325 mg by mouth.    . Fluticasone Furoate-Vilanterol (BREO ELLIPTA) 200-25 MCG/INH AEPB Inhale 1 puff into the lungs daily.    . Fluticasone-Salmeterol (ADVAIR) 250-50 MCG/DOSE AEPB Inhale 1 puff into the lungs 2 (two) times daily.    . insulin detemir (LEVEMIR) 100 UNIT/ML injection Inject 45 Units into the skin at bedtime.    . Insulin Syringe-Needle U-100 (B-D INS SYR ULTRAFINE .5CC/30G) 30G X 1/2" 0.5 ML MISC Use 3x a day 300 each 3  . pantoprazole (PROTONIX) 40 MG tablet Take 1 tablet (40 mg total) by mouth daily. 90 tablet 1  . potassium chloride (K-DUR,KLOR-CON) 10 MEQ tablet Take by mouth.    . pramipexole (MIRAPEX) 1 MG tablet Take 1 tablet (1 mg total) by mouth at bedtime. 90 tablet 0  . QUEtiapine (SEROQUEL) 25 MG tablet Take 12.5 mg by mouth at bedtime.     . sertraline (ZOLOFT) 50 MG tablet Take 1.5 tablets (75 mg total) by mouth daily. 135 tablet 3  . sertraline (ZOLOFT) 50 MG tablet Take by mouth.    . sitaGLIPtin (JANUVIA) 50 MG tablet Take by mouth.    . torsemide (DEMADEX) 20 MG  tablet TAKE FOUR TABLETS BY MOUTH TWICE DAILY     No current facility-administered medications on file prior to visit.     Allergies:  No Known Allergies  Physical Exam General: Obese elderly Caucasian lady seated, in no evident distress Head: head normocephalic and atraumatic.   Neck: supple with no carotid or supraclavicular bruits Cardiovascular: regular rate and rhythm, no murmurs Musculoskeletal: no deformity Skin:  no rash/petichiae Vascular:  Normal pulses all extremities  Neurologic Exam Mental Status: Awake and fully alert. Moderate receptive and mild expressive aphasia with nonfluent speech with paraphasic errors and some word hesitancy. Unable to name repeat right.  Cranial  Nerves: Fundoscopic exam reveals sharp disc margins. Pupils equal, briskly reactive to light. Extraocular movements full without nystagmus. Visual fields show partial right homonymous hemianopsia to confrontation. Hearing intact. Facial sensation intact. Face, tongue, palate moves normally and symmetrically.  Motor: Normal bulk and tone. Normal strength in all tested extremity muscles except mild weakness of right hip flexors and ankle dorsiflexors only.. Sensory.: intact to touch , pinprick , position and vibratory sensation.  Coordination: Rapid alternating movements normal in all extremities. Finger-to-nose and heel-to-shin performed accurately bilaterally. Gait and Station: Arises from chair without difficulty. Stance is normal. Gait demonstrates  slight wide-based with favoring of the right leg. Uses a cane. Unable to do tandem walking .  Reflexes: 1+ and symmetric. Toes downgoing.   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 She has obtained improvement in hemiparesis but significant aphasia  persists    PLAN: I had a long d/w patient about his recent cryptogenic stroke,aphasia risk for recurrent stroke/TIAs, personally independently reviewed imaging studies and stroke evaluation results and answered questions.Continue Plavix for secondary stroke prevention and maintain strict control of hypertension with blood pressure goal below 130/90, diabetes with hemoglobin A1c goal below 6.5% and lipids with LDL cholesterol goal below 70 mg/dL. she was also advised to be compliant with her CPAP machine I also advised the patient to eat a healthy diet with plenty of whole grains, cereals, fruits and vegetables, exercise regularly and maintain ideal body weight .continue ongoing speech therapy for language. She will need outpatient transesophageal echocardiogram and loop recorder insertion for paroxysmal atrial fibrillation but will arrange this at next visit after she is no longer in a skilled nursing facility and at home .greater than 50% time during this 45 minute consultation visit was spent on counseling and coordination of care about her aphasia, cryptogenic stroke and answering questions Followup in the future with my nurse practitioner Janett Billow in 4 weeks or call earlier if necessary Antony Contras, MD  [facility] [address] [place], [place] [address]  Phone [phone] Fax [phone] Note: This document was prepared with digital dictation and possible smart phrase technology. Any transcriptional errors that result from this process are unintentional.

## 2017-05-16 NOTE — Patient Instructions (Signed)
I had a long d/w patient about his recent cryptogenic stroke,aphasia risk for recurrent stroke/TIAs, personally independently reviewed imaging studies and stroke evaluation results and answered questions.Continue Plavix for secondary stroke prevention and maintain strict control of hypertension with blood pressure goal below 130/90, diabetes with hemoglobin A1c goal below 6.5% and lipids with LDL cholesterol goal below 70 mg/dL. I also advised the patient to eat a healthy diet with plenty of whole grains, cereals, fruits and vegetables, exercise regularly and maintain ideal body weight .continue ongoing speech therapy for language. She will need outpatient transesophageal echocardiogram and loop recorder insertion for paroxysmal atrial fibrillation but will arrange this at next visit after she is no longer in a skilled nursing facility and at home Followup in the future with my nurse practitioner Janett Billow in 4 weeks or call earlier if necessary  Stroke Prevention Some medical conditions and behaviors are associated with a higher chance of having a stroke. You can help prevent a stroke by making nutrition, lifestyle, and other changes, including managing any medical conditions you may have. What nutrition changes can be made?  Eat healthy foods. You can do this by: ? Choosing foods high in fiber, such as fresh fruits and vegetables and whole grains. ? Eating at least 5 or more servings of fruits and vegetables a day. Try to fill half of your plate at each meal with fruits and vegetables. ? Choosing lean protein foods, such as lean cuts of meat, poultry without skin, fish, tofu, beans, and nuts. ? Eating low-fat dairy products. ? Avoiding foods that are high in salt (sodium). This can help lower blood pressure. ? Avoiding foods that have saturated fat, trans fat, and cholesterol. This can help prevent high cholesterol. ? Avoiding processed and premade foods.  Follow your health care provider's specific  guidelines for losing weight, controlling high blood pressure (hypertension), lowering high cholesterol, and managing diabetes. These may include: ? Reducing your daily calorie intake. ? Limiting your daily sodium intake to 1,500 milligrams (mg). ? Using only healthy fats for cooking, such as olive oil, canola oil, or sunflower oil. ? Counting your daily carbohydrate intake. What lifestyle changes can be made?  Maintain a healthy weight. Talk to your health care provider about your ideal weight.  Get at least 30 minutes of moderate physical activity at least 5 days a week. Moderate activity includes brisk walking, biking, and swimming.  Do not use any products that contain nicotine or tobacco, such as cigarettes and e-cigarettes. If you need help quitting, ask your health care provider. It may also be helpful to avoid exposure to secondhand smoke.  Limit alcohol intake to no more than 1 drink a day for nonpregnant women and 2 drinks a day for men. One drink equals 12 oz of beer, 5 oz of wine, or 1 oz of hard liquor.  Stop any illegal drug use.  Avoid taking birth control pills. Talk to your health care provider about the risks of taking birth control pills if: ? You are over 100 years old. ? You smoke. ? You get migraines. ? You have ever had a blood clot. What other changes can be made?  Manage your cholesterol levels. ? Eating a healthy diet is important for preventing high cholesterol. If cholesterol cannot be managed through diet alone, you may also need to take medicines. ? Take any prescribed medicines to control your cholesterol as told by your health care provider.  Manage your diabetes. ? Eating a healthy diet and exercising  regularly are important parts of managing your blood sugar. If your blood sugar cannot be managed through diet and exercise, you may need to take medicines. ? Take any prescribed medicines to control your diabetes as told by your health care  provider.  Control your hypertension. ? To reduce your risk of stroke, try to keep your blood pressure below 130/80. ? Eating a healthy diet and exercising regularly are an important part of controlling your blood pressure. If your blood pressure cannot be managed through diet and exercise, you may need to take medicines. ? Take any prescribed medicines to control hypertension as told by your health care provider. ? Ask your health care provider if you should monitor your blood pressure at home. ? Have your blood pressure checked every year, even if your blood pressure is normal. Blood pressure increases with age and some medical conditions.  Get evaluated for sleep disorders (sleep apnea). Talk to your health care provider about getting a sleep evaluation if you snore a lot or have excessive sleepiness.  Take over-the-counter and prescription medicines only as told by your health care provider. Aspirin or blood thinners (antiplatelets or anticoagulants) may be recommended to reduce your risk of forming blood clots that can lead to stroke.  Make sure that any other medical conditions you have, such as atrial fibrillation or atherosclerosis, are managed. What are the warning signs of a stroke? The warning signs of a stroke can be easily remembered as BEFAST.  B is for balance. Signs include: ? Dizziness. ? Loss of balance or coordination. ? Sudden trouble walking.  E is for eyes. Signs include: ? A sudden change in vision. ? Trouble seeing.  F is for face. Signs include: ? Sudden weakness or numbness of the face. ? The face or eyelid drooping to one side.  A is for arms. Signs include: ? Sudden weakness or numbness of the arm, usually on one side of the body.  S is for speech. Signs include: ? Trouble speaking (aphasia). ? Trouble understanding.  T is for time. ? These symptoms may represent a serious problem that is an emergency. Do not wait to see if the symptoms will go away.  Get medical help right away. Call your local emergency services (911 in the [place] Do not drive yourself to the hospital.  Other signs of stroke may include: ? A sudden, severe headache with no known cause. ? Nausea or vomiting. ? Seizure.  Where to find more information: For more information, visit:  American Stroke Association: www.strokeassociation.org  National Stroke Association: www.stroke.org  Summary  You can prevent a stroke by eating healthy, exercising, not smoking, limiting alcohol intake, and managing any medical conditions you may have.  Do not use any products that contain nicotine or tobacco, such as cigarettes and e-cigarettes. If you need help quitting, ask your health care provider. It may also be helpful to avoid exposure to secondhand smoke.  Remember BEFAST for warning signs of stroke. Get help right away if you or a loved one has any of these signs. This information is not intended to replace advice given to you by your health care provider. Make sure you discuss any questions you have with your health care provider. Document Released: 04/29/2004 Document Revised: 04/27/2016 Document Reviewed: 04/27/2016 Elsevier Interactive Patient Education  [employer].

## 2017-05-22 DIAGNOSIS — Z79899 Other long term (current) drug therapy: Secondary | ICD-10-CM | POA: Diagnosis not present

## 2017-05-27 DIAGNOSIS — D631 Anemia in chronic kidney disease: Secondary | ICD-10-CM | POA: Diagnosis not present

## 2017-05-27 DIAGNOSIS — I5030 Unspecified diastolic (congestive) heart failure: Secondary | ICD-10-CM | POA: Diagnosis not present

## 2017-05-27 DIAGNOSIS — I110 Hypertensive heart disease with heart failure: Secondary | ICD-10-CM | POA: Diagnosis not present

## 2017-05-27 DIAGNOSIS — N183 Chronic kidney disease, stage 3 (moderate): Secondary | ICD-10-CM | POA: Diagnosis not present

## 2017-05-30 ENCOUNTER — Telehealth: Payer: Self-pay | Admitting: Family Medicine

## 2017-05-30 DIAGNOSIS — I110 Hypertensive heart disease with heart failure: Secondary | ICD-10-CM | POA: Diagnosis not present

## 2017-05-30 DIAGNOSIS — G2581 Restless legs syndrome: Secondary | ICD-10-CM | POA: Diagnosis not present

## 2017-05-30 DIAGNOSIS — D696 Thrombocytopenia, unspecified: Secondary | ICD-10-CM | POA: Diagnosis not present

## 2017-05-30 DIAGNOSIS — I6990 Unspecified sequelae of unspecified cerebrovascular disease: Secondary | ICD-10-CM | POA: Diagnosis not present

## 2017-05-30 NOTE — Telephone Encounter (Signed)
Copied from [place]. Topic: Quick Communication - Rx Refill/Question >> May 30, 2017 12:47 PM Bea Graff, NT wrote: Medication: glucometer, lancets, and strips.    Has the patient contacted their pharmacy? Yes.     (Agent: If no, request that the patient contact the pharmacy for the refill.)   Preferred Pharmacy (with phone number or street name): [facility] [place]: Please be advised that RX refills may take up to 3 business days. We ask that you follow-up with your pharmacy.

## 2017-05-31 ENCOUNTER — Telehealth: Payer: Self-pay | Admitting: Family Medicine

## 2017-05-31 DIAGNOSIS — I5033 Acute on chronic diastolic (congestive) heart failure: Secondary | ICD-10-CM | POA: Diagnosis not present

## 2017-05-31 DIAGNOSIS — E1151 Type 2 diabetes mellitus with diabetic peripheral angiopathy without gangrene: Secondary | ICD-10-CM | POA: Diagnosis not present

## 2017-05-31 DIAGNOSIS — I130 Hypertensive heart and chronic kidney disease with heart failure and stage 1 through stage 4 chronic kidney disease, or unspecified chronic kidney disease: Secondary | ICD-10-CM | POA: Diagnosis not present

## 2017-05-31 DIAGNOSIS — E1140 Type 2 diabetes mellitus with diabetic neuropathy, unspecified: Secondary | ICD-10-CM | POA: Diagnosis not present

## 2017-05-31 DIAGNOSIS — I69351 Hemiplegia and hemiparesis following cerebral infarction affecting right dominant side: Secondary | ICD-10-CM | POA: Diagnosis not present

## 2017-05-31 DIAGNOSIS — Z7902 Long term (current) use of antithrombotics/antiplatelets: Secondary | ICD-10-CM | POA: Diagnosis not present

## 2017-05-31 DIAGNOSIS — N183 Chronic kidney disease, stage 3 (moderate): Secondary | ICD-10-CM | POA: Diagnosis not present

## 2017-05-31 DIAGNOSIS — F329 Major depressive disorder, single episode, unspecified: Secondary | ICD-10-CM | POA: Diagnosis not present

## 2017-05-31 DIAGNOSIS — I69320 Aphasia following cerebral infarction: Secondary | ICD-10-CM | POA: Diagnosis not present

## 2017-05-31 DIAGNOSIS — Z87440 Personal history of urinary (tract) infections: Secondary | ICD-10-CM | POA: Diagnosis not present

## 2017-05-31 DIAGNOSIS — M109 Gout, unspecified: Secondary | ICD-10-CM | POA: Diagnosis not present

## 2017-05-31 DIAGNOSIS — Z794 Long term (current) use of insulin: Secondary | ICD-10-CM | POA: Diagnosis not present

## 2017-05-31 DIAGNOSIS — E1122 Type 2 diabetes mellitus with diabetic chronic kidney disease: Secondary | ICD-10-CM | POA: Diagnosis not present

## 2017-05-31 DIAGNOSIS — F419 Anxiety disorder, unspecified: Secondary | ICD-10-CM | POA: Diagnosis not present

## 2017-05-31 DIAGNOSIS — D696 Thrombocytopenia, unspecified: Secondary | ICD-10-CM | POA: Diagnosis not present

## 2017-05-31 DIAGNOSIS — J449 Chronic obstructive pulmonary disease, unspecified: Secondary | ICD-10-CM | POA: Diagnosis not present

## 2017-05-31 MED ORDER — GLUCOSE BLOOD VI STRP: ORAL_STRIP | 1 refills | Status: DC

## 2017-05-31 MED ORDER — BLOOD GLUCOSE METER KIT: PACK | 0 refills | Status: DC

## 2017-05-31 MED ORDER — LANCETS MISC: 1 refills | Status: DC

## 2017-05-31 NOTE — Telephone Encounter (Signed)
Patient daughter notified that rxs has been sent in.

## 2017-05-31 NOTE — Telephone Encounter (Signed)
Copied from [place] [id number]. Topic: Quick Communication - See Telephone Encounter >> May 31, 2017  2:25 PM Boyd Kerbs wrote: CRM for notification. See Telephone encounter for:   Michelle, [place] [phone]  Verbal orders Education and medication checks  2 x 3 ;   1 x 6 weeks  05/31/17.

## 2017-06-01 DIAGNOSIS — I130 Hypertensive heart and chronic kidney disease with heart failure and stage 1 through stage 4 chronic kidney disease, or unspecified chronic kidney disease: Secondary | ICD-10-CM | POA: Diagnosis not present

## 2017-06-01 DIAGNOSIS — I69320 Aphasia following cerebral infarction: Secondary | ICD-10-CM | POA: Diagnosis not present

## 2017-06-01 DIAGNOSIS — E1122 Type 2 diabetes mellitus with diabetic chronic kidney disease: Secondary | ICD-10-CM | POA: Diagnosis not present

## 2017-06-01 DIAGNOSIS — I5033 Acute on chronic diastolic (congestive) heart failure: Secondary | ICD-10-CM | POA: Diagnosis not present

## 2017-06-01 DIAGNOSIS — I69351 Hemiplegia and hemiparesis following cerebral infarction affecting right dominant side: Secondary | ICD-10-CM | POA: Diagnosis not present

## 2017-06-01 DIAGNOSIS — N183 Chronic kidney disease, stage 3 (moderate): Secondary | ICD-10-CM | POA: Diagnosis not present

## 2017-06-01 NOTE — Telephone Encounter (Signed)
Left message on machine with verbal orders. 

## 2017-06-02 ENCOUNTER — Ambulatory Visit: Payer: Self-pay | Admitting: Family Medicine

## 2017-06-03 DIAGNOSIS — I69320 Aphasia following cerebral infarction: Secondary | ICD-10-CM | POA: Diagnosis not present

## 2017-06-03 DIAGNOSIS — E1122 Type 2 diabetes mellitus with diabetic chronic kidney disease: Secondary | ICD-10-CM | POA: Diagnosis not present

## 2017-06-03 DIAGNOSIS — I69351 Hemiplegia and hemiparesis following cerebral infarction affecting right dominant side: Secondary | ICD-10-CM | POA: Diagnosis not present

## 2017-06-03 DIAGNOSIS — N183 Chronic kidney disease, stage 3 (moderate): Secondary | ICD-10-CM | POA: Diagnosis not present

## 2017-06-03 DIAGNOSIS — I130 Hypertensive heart and chronic kidney disease with heart failure and stage 1 through stage 4 chronic kidney disease, or unspecified chronic kidney disease: Secondary | ICD-10-CM | POA: Diagnosis not present

## 2017-06-03 DIAGNOSIS — I5033 Acute on chronic diastolic (congestive) heart failure: Secondary | ICD-10-CM | POA: Diagnosis not present

## 2017-06-06 DIAGNOSIS — N183 Chronic kidney disease, stage 3 (moderate): Secondary | ICD-10-CM | POA: Diagnosis not present

## 2017-06-06 DIAGNOSIS — I69351 Hemiplegia and hemiparesis following cerebral infarction affecting right dominant side: Secondary | ICD-10-CM | POA: Diagnosis not present

## 2017-06-06 DIAGNOSIS — E1122 Type 2 diabetes mellitus with diabetic chronic kidney disease: Secondary | ICD-10-CM | POA: Diagnosis not present

## 2017-06-06 DIAGNOSIS — I5033 Acute on chronic diastolic (congestive) heart failure: Secondary | ICD-10-CM | POA: Diagnosis not present

## 2017-06-06 DIAGNOSIS — I69320 Aphasia following cerebral infarction: Secondary | ICD-10-CM | POA: Diagnosis not present

## 2017-06-06 DIAGNOSIS — I130 Hypertensive heart and chronic kidney disease with heart failure and stage 1 through stage 4 chronic kidney disease, or unspecified chronic kidney disease: Secondary | ICD-10-CM | POA: Diagnosis not present

## 2017-06-07 ENCOUNTER — Telehealth: Payer: Self-pay | Admitting: Family Medicine

## 2017-06-07 DIAGNOSIS — I130 Hypertensive heart and chronic kidney disease with heart failure and stage 1 through stage 4 chronic kidney disease, or unspecified chronic kidney disease: Secondary | ICD-10-CM | POA: Diagnosis not present

## 2017-06-07 DIAGNOSIS — E1122 Type 2 diabetes mellitus with diabetic chronic kidney disease: Secondary | ICD-10-CM | POA: Diagnosis not present

## 2017-06-07 DIAGNOSIS — N183 Chronic kidney disease, stage 3 (moderate): Secondary | ICD-10-CM | POA: Diagnosis not present

## 2017-06-07 DIAGNOSIS — I5033 Acute on chronic diastolic (congestive) heart failure: Secondary | ICD-10-CM | POA: Diagnosis not present

## 2017-06-07 DIAGNOSIS — I69320 Aphasia following cerebral infarction: Secondary | ICD-10-CM | POA: Diagnosis not present

## 2017-06-07 DIAGNOSIS — I69351 Hemiplegia and hemiparesis following cerebral infarction affecting right dominant side: Secondary | ICD-10-CM | POA: Diagnosis not present

## 2017-06-07 NOTE — Telephone Encounter (Signed)
Copied from [place]. Topic: Quick Communication - See Telephone Encounter >> Jun 07, 2017  9:55 AM Synthia Innocent wrote: CRM for notification. See Telephone encounter for: Verbal order for Speech Therapy, 2x a week for 8 weeks  06/07/17.

## 2017-06-07 NOTE — Telephone Encounter (Signed)
Left message on machine verbal orders given

## 2017-06-08 DIAGNOSIS — I130 Hypertensive heart and chronic kidney disease with heart failure and stage 1 through stage 4 chronic kidney disease, or unspecified chronic kidney disease: Secondary | ICD-10-CM | POA: Diagnosis not present

## 2017-06-08 DIAGNOSIS — I5033 Acute on chronic diastolic (congestive) heart failure: Secondary | ICD-10-CM | POA: Diagnosis not present

## 2017-06-08 DIAGNOSIS — N183 Chronic kidney disease, stage 3 (moderate): Secondary | ICD-10-CM | POA: Diagnosis not present

## 2017-06-08 DIAGNOSIS — I69320 Aphasia following cerebral infarction: Secondary | ICD-10-CM | POA: Diagnosis not present

## 2017-06-08 DIAGNOSIS — E1122 Type 2 diabetes mellitus with diabetic chronic kidney disease: Secondary | ICD-10-CM | POA: Diagnosis not present

## 2017-06-08 DIAGNOSIS — I69351 Hemiplegia and hemiparesis following cerebral infarction affecting right dominant side: Secondary | ICD-10-CM | POA: Diagnosis not present

## 2017-06-09 ENCOUNTER — Telehealth: Payer: Self-pay | Admitting: Family Medicine

## 2017-06-09 DIAGNOSIS — I69320 Aphasia following cerebral infarction: Secondary | ICD-10-CM | POA: Diagnosis not present

## 2017-06-09 DIAGNOSIS — I69351 Hemiplegia and hemiparesis following cerebral infarction affecting right dominant side: Secondary | ICD-10-CM | POA: Diagnosis not present

## 2017-06-09 DIAGNOSIS — I130 Hypertensive heart and chronic kidney disease with heart failure and stage 1 through stage 4 chronic kidney disease, or unspecified chronic kidney disease: Secondary | ICD-10-CM | POA: Diagnosis not present

## 2017-06-09 DIAGNOSIS — N183 Chronic kidney disease, stage 3 (moderate): Secondary | ICD-10-CM | POA: Diagnosis not present

## 2017-06-09 DIAGNOSIS — E1122 Type 2 diabetes mellitus with diabetic chronic kidney disease: Secondary | ICD-10-CM | POA: Diagnosis not present

## 2017-06-09 DIAGNOSIS — I5033 Acute on chronic diastolic (congestive) heart failure: Secondary | ICD-10-CM | POA: Diagnosis not present

## 2017-06-09 NOTE — Telephone Encounter (Signed)
Copied from [place] [id number]. Topic: Quick Communication - See Telephone Encounter >> Jun 09, 2017  8:04 AM Aurelio Brash B wrote: CRM for notification. See Telephone encounter for:  Helene Kelp from [facility] called about the pts systolic bp perimeter.  She states Dr Carollee Herter has it set at 130  and it has been over 130 each time they have been out.  Shes asking about changing to perimeter to 160. Her contact number is [phone] 06/09/17.

## 2017-06-09 NOTE — Telephone Encounter (Signed)
Pt seeing nephrology at [facility] for bp They need to contact them

## 2017-06-10 ENCOUNTER — Ambulatory Visit (INDEPENDENT_AMBULATORY_CARE_PROVIDER_SITE_OTHER): Payer: Medicare Other | Admitting: Family Medicine

## 2017-06-10 ENCOUNTER — Encounter: Payer: Self-pay | Admitting: Family Medicine

## 2017-06-10 ENCOUNTER — Telehealth: Payer: Self-pay | Admitting: *Deleted

## 2017-06-10 VITALS — BP 124/60 | HR 76 | Temp 97.8°F | Resp 16

## 2017-06-10 DIAGNOSIS — I639 Cerebral infarction, unspecified: Secondary | ICD-10-CM

## 2017-06-10 DIAGNOSIS — N179 Acute kidney failure, unspecified: Secondary | ICD-10-CM

## 2017-06-10 DIAGNOSIS — IMO0002 Reserved for concepts with insufficient information to code with codable children: Secondary | ICD-10-CM

## 2017-06-10 DIAGNOSIS — Z8673 Personal history of transient ischemic attack (TIA), and cerebral infarction without residual deficits: Secondary | ICD-10-CM

## 2017-06-10 DIAGNOSIS — E785 Hyperlipidemia, unspecified: Secondary | ICD-10-CM

## 2017-06-10 DIAGNOSIS — I10 Essential (primary) hypertension: Secondary | ICD-10-CM | POA: Diagnosis not present

## 2017-06-10 DIAGNOSIS — I509 Heart failure, unspecified: Secondary | ICD-10-CM | POA: Diagnosis not present

## 2017-06-10 DIAGNOSIS — E1165 Type 2 diabetes mellitus with hyperglycemia: Secondary | ICD-10-CM

## 2017-06-10 DIAGNOSIS — E1151 Type 2 diabetes mellitus with diabetic peripheral angiopathy without gangrene: Secondary | ICD-10-CM | POA: Diagnosis not present

## 2017-06-10 DIAGNOSIS — E1122 Type 2 diabetes mellitus with diabetic chronic kidney disease: Secondary | ICD-10-CM | POA: Diagnosis not present

## 2017-06-10 DIAGNOSIS — I130 Hypertensive heart and chronic kidney disease with heart failure and stage 1 through stage 4 chronic kidney disease, or unspecified chronic kidney disease: Secondary | ICD-10-CM | POA: Diagnosis not present

## 2017-06-10 DIAGNOSIS — I69320 Aphasia following cerebral infarction: Secondary | ICD-10-CM | POA: Diagnosis not present

## 2017-06-10 DIAGNOSIS — N183 Chronic kidney disease, stage 3 (moderate): Secondary | ICD-10-CM | POA: Diagnosis not present

## 2017-06-10 DIAGNOSIS — I5033 Acute on chronic diastolic (congestive) heart failure: Secondary | ICD-10-CM | POA: Diagnosis not present

## 2017-06-10 DIAGNOSIS — I69351 Hemiplegia and hemiparesis following cerebral infarction affecting right dominant side: Secondary | ICD-10-CM | POA: Diagnosis not present

## 2017-06-10 MED ORDER — INSULIN DETEMIR 100 UNIT/ML FLEXPEN: PEN_INJECTOR | SUBCUTANEOUS | 11 refills | Status: DC

## 2017-06-10 NOTE — Progress Notes (Signed)
Subjective:  I acted as a Erin [name] for Erin [name]. Erin [name], Erin [name]   Patient ID: Erin [name], female    DOB: 02/13/1946, 72 y.o.   MRN: [id number]  Chief Complaint  Patient presents with  . Follow-up    HPI  Patient is in today for follow up visit.  Pt is here with her daughters.  Neuro would like some testing done but pt family would like to see cardiology first . Patient Care Team: Carollee Herter, Alferd Apa, DO as PCP - General Thereasa Distance, MD as Consulting Physician (Nephrology) Philemon Kingdom, MD as Consulting Physician (Internal Medicine) Stanford Breed, Denice Bors, MD as Consulting Physician (Cardiology) Gardiner Rhyme, MD as Consulting Physician (Pulmonary Disease) Francina Ames, MD as Referring Physician (Otolaryngology)   Past Medical History:  Diagnosis Date  . Asthma   . CHF (congestive heart failure) [facility])    hospital 11/2013-- [place]  . Chronic kidney disease   . Depression   . Diabetes mellitus ([place])   . GERD (gastroesophageal reflux disease)   . Hyperlipidemia   . Hypertension   . Stroke [facility])     Past Surgical History:  Procedure Laterality Date  . CHOLECYSTECTOMY  2002  . SPINE SURGERY  aug 2015   [facility]  . TONSILLECTOMY    . TUBAL LIGATION      Family History  Problem Relation Age of Onset  . Hypertension Mother   . Alzheimer's disease Mother   . Hyperlipidemia Mother   . Heart disease Father        cad  . Asthma Father   . Cancer Father 8       leukemia  . Stroke Father   . Hypertension Father   . Heart disease Sister 46       MI  . Pulmonary fibrosis Sister   . Arthritis Brother   . Heart disease Maternal Uncle   . Stroke Maternal Uncle   . Uterine cancer Paternal Grandmother   . Stomach cancer Paternal Grandfather   . Heart disease Maternal Uncle   . Heart disease Maternal Uncle     Social History   Socioeconomic History  . Marital status: Widowed    Spouse name: Not on file  . Number of children: 4    . Years of Erin: Not on file  . Highest Erin level: Not on file  Social Needs  . Financial resource strain: Not on file  . Food insecurity - worry: Not on file  . Food insecurity - inability: Not on file  . Transportation needs - medical: Not on file  . Transportation needs - non-medical: Not on file  Occupational History  . Occupation: [employer] @ [place]: [employer]  Tobacco Use  . Smoking status: Former Smoker    Packs/day: 1.50    Years: 20.00    Pack years: 30.00    Types: Cigarettes    Last attempt to quit: 08/02/1984    Years since quitting: 32.8  . Smokeless tobacco: Never Used  Substance and Sexual Activity  . Alcohol use: No  . Drug use: No  . Sexual activity: Not Currently    Partners: Male  Other Topics Concern  . Not on file  Social History Narrative   NO REG EXERCISE   Caffeine use: daily    Outpatient Medications Prior to Visit  Medication Sig Dispense Refill  . Acetaminophen (TYLENOL 8 HOUR PO) Take 500 mg by mouth.    Marland Kitchen albuterol (VENTOLIN HFA)  108 (90 Base) MCG/ACT inhaler Inhale into the lungs.    Marland Kitchen allopurinol (ZYLOPRIM) 100 MG tablet Take 1 tablet by mouth 2 (two) times daily.    Marland Kitchen atorvastatin (LIPITOR) 40 MG tablet TAKE 1 TABLET BY MOUTH ONCE DAILY 90 tablet 1  . blood glucose meter kit and supplies Dispense based on patient and insurance preference. Use once day as directed.  Dx Code: E11.51 1 each 0  . busPIRone (BUSPAR) 15 MG tablet Take 1 tablet (15 mg total) 3 (three) times daily by mouth. 90 tablet 1  . carvedilol (COREG) 6.25 MG tablet TAKE 1 TABLET BY MOUTH EVERY 12 HOURS 180 tablet 3  . clopidogrel (PLAVIX) 75 MG tablet Take by mouth.    . ergocalciferol (VITAMIN D2) 50000 UNITS capsule Take 50,000 Units by mouth once a week.    . escitalopram (LEXAPRO) 10 MG tablet Take 1 tablet (10 mg total) by mouth daily. 30 tablet 2  . fenofibrate 160 MG tablet Take 1 tablet (160 mg total) daily by mouth. 90 tablet 2  .  ferrous sulfate 325 (65 FE) MG tablet Take 325 mg by mouth.    . Fluticasone-Salmeterol (ADVAIR) 250-50 MCG/DOSE AEPB Inhale 1 puff into the lungs 2 (two) times daily.    Marland Kitchen glucose blood test strip Use as directed once a day.  DX code: E11.51 100 each 1  . Insulin Syringe-Needle U-100 (B-D INS SYR ULTRAFINE .5CC/30G) 30G X 1/2" 0.5 ML MISC Use 3x a day 300 each 3  . Lancets MISC Use as directed once a day.  DX code: E11.51 100 each 1  . pantoprazole (PROTONIX) 40 MG tablet Take 1 tablet (40 mg total) by mouth daily. 90 tablet 1  . pramipexole (MIRAPEX) 1 MG tablet Take 1 tablet (1 mg total) by mouth at bedtime. 90 tablet 0  . sertraline (ZOLOFT) 50 MG tablet Take 1.5 tablets (75 mg total) by mouth daily. 135 tablet 3  . sertraline (ZOLOFT) 50 MG tablet Take by mouth.    . sitaGLIPtin (JANUVIA) 50 MG tablet Take by mouth.    . torsemide (DEMADEX) 20 MG tablet TAKE FOUR TABLETS BY MOUTH TWICE DAILY    . insulin detemir (LEVEMIR) 100 UNIT/ML injection Inject 45 Units into the skin at bedtime.    Marland Kitchen QUEtiapine (SEROQUEL) 25 MG tablet Take 12.5 mg by mouth at bedtime.     Marland Kitchen albuterol (PROVENTIL) (2.5 MG/3ML) 0.083% nebulizer solution Take 3 mLs (2.5 mg total) by nebulization every 6 (six) hours as needed for wheezing or shortness of breath. (Patient not taking: Reported on 06/10/2017) 75 mL 12  . Fluticasone Furoate-Vilanterol (BREO ELLIPTA) 200-25 MCG/INH AEPB Inhale 1 puff into the lungs daily.    . potassium chloride (K-DUR,KLOR-CON) 10 MEQ tablet Take by mouth.     No facility-administered medications prior to visit.     No Known Allergies  Review of Systems  Constitutional: Negative for chills, fever and malaise/fatigue.  HENT: Negative for congestion and hearing loss.   Eyes: Negative for discharge.  Respiratory: Negative for cough, sputum production and shortness of breath.   Cardiovascular: Negative for chest pain, palpitations and leg swelling.  Gastrointestinal: Negative for abdominal  pain, blood in stool, constipation, diarrhea, heartburn, nausea and vomiting.  Genitourinary: Negative for dysuria, frequency, hematuria and urgency.  Musculoskeletal: Negative for back pain, falls and myalgias.  Skin: Negative for rash.  Neurological: Negative for dizziness, sensory change, loss of consciousness, weakness and headaches.  Endo/Heme/Allergies: Negative for environmental allergies. Does not  bruise/bleed easily.  Psychiatric/Behavioral: Negative for depression and suicidal ideas. The patient is not nervous/anxious and does not have insomnia.        Objective:    Physical Exam  Constitutional: She appears well-developed and well-nourished.  HENT:  Head: Normocephalic and atraumatic.  Eyes: Conjunctivae and EOM are normal.  Neck: Normal range of motion. Neck supple. No JVD present. Carotid bruit is not present. No thyromegaly present.  Cardiovascular: Normal rate, regular rhythm and normal heart sounds.  No murmur heard. Pulmonary/Chest: Effort normal and breath sounds normal. No respiratory distress. She has no wheezes. She has no rales. She exhibits no tenderness.  Musculoskeletal: She exhibits no edema.  Neurological: She is alert.  Psychiatric: She has a normal mood and affect. She exhibits abnormal recent memory.  Nursing note and vitals reviewed.   BP 124/60 (BP Location: Left Arm, Patient Position: Sitting)   Pulse 76   Temp 97.8 F (36.6 C) (Oral)   Resp 16   SpO2 96%  Wt Readings from Last 3 Encounters:  05/16/17 248 lb 14.4 oz (112.9 kg)  03/17/17 268 lb (121.6 kg)  02/11/17 252 lb 4 oz (114.4 kg)   BP Readings from Last 3 Encounters:  06/10/17 124/60  05/16/17 (!) 126/51  03/17/17 (!) 188/80     Immunization History  Administered Date(s) Administered  . Influenza Split 02/23/2011, 01/31/2012  . Influenza Whole 01/06/2009  . Influenza, High Dose Seasonal PF 02/05/2014, 12/10/2014, 01/06/2016, 01/11/2017  . Influenza,inj,Quad PF,6+ Mos 01/17/2013   . Pneumococcal Conjugate-13 02/05/2014  . Pneumococcal Polysaccharide-23 04/29/2010  . Td 03/17/2005  . Tdap 08/11/2016  . Zoster 10/16/2007    Health Maintenance  Topic Date Due  . URINE MICROALBUMIN  01/05/2017  . OPHTHALMOLOGY EXAM  06/03/2017  . HEMOGLOBIN A1C  07/26/2017  . COLONOSCOPY  11/14/2017  . FOOT EXAM  01/11/2018  . MAMMOGRAM  07/21/2018  . TETANUS/TDAP  08/12/2026  . INFLUENZA VACCINE  Completed  . DEXA SCAN  Completed  . Hepatitis C Screening  Completed  . PNA vac Low Risk Adult  Completed    Lab Results  Component Value Date   WBC 16.2 (H) 10/14/2016   HGB 10.4 (L) 10/14/2016   HCT 32.0 (L) 10/14/2016   PLT 72.0 (L) 10/14/2016   GLUCOSE 240 (H) 01/25/2017   CHOL 126 01/25/2017   TRIG 264.0 (H) 01/25/2017   HDL 33.30 (L) 01/25/2017   LDLDIRECT 55.0 01/25/2017   LDLCALC 33 10/18/2016   ALT 16 01/25/2017   AST 25 01/25/2017   NA 137 01/25/2017   K 3.5 01/25/2017   CL 97 01/25/2017   CREATININE 1.17 01/25/2017   BUN 38 (H) 01/25/2017   CO2 31 01/25/2017   TSH 2.29 05/17/2011   HGBA1C 10.6 (H) 01/25/2017   MICROALBUR 2.7 (H) 01/06/2016    Lab Results  Component Value Date   TSH 2.29 05/17/2011   Lab Results  Component Value Date   WBC 16.2 (H) 10/14/2016   HGB 10.4 (L) 10/14/2016   HCT 32.0 (L) 10/14/2016   MCV 85.2 10/14/2016   PLT 72.0 (L) 10/14/2016   Lab Results  Component Value Date   NA 137 01/25/2017   K 3.5 01/25/2017   CO2 31 01/25/2017   GLUCOSE 240 (H) 01/25/2017   BUN 38 (H) 01/25/2017   CREATININE 1.17 01/25/2017   BILITOT 0.5 01/25/2017   ALKPHOS 81 01/25/2017   AST 25 01/25/2017   ALT 16 01/25/2017   PROT 7.7 01/25/2017   ALBUMIN 3.8  01/25/2017   CALCIUM 9.4 01/25/2017   GFR 48.36 (L) 01/25/2017   Lab Results  Component Value Date   CHOL 126 01/25/2017   Lab Results  Component Value Date   HDL 33.30 (L) 01/25/2017   Lab Results  Component Value Date   LDLCALC 33 10/18/2016   Lab Results  Component  Value Date   TRIG 264.0 (H) 01/25/2017   Lab Results  Component Value Date   CHOLHDL 4 01/25/2017   Lab Results  Component Value Date   HGBA1C 10.6 (H) 01/25/2017         Assessment & Plan:   Problem List Items Addressed This Visit      Unprioritized   Acute renal failure ([place])    Per nephrology      CHF (congestive heart failure) ([place])    Per cardiology      DM (diabetes mellitus) type II uncontrolled, periph vascular disorder ([place]) - Primary    Needs f/u with endo      Relevant Medications   Insulin Detemir (LEVEMIR FLEXTOUCH) 100 UNIT/ML Pen   Other Relevant Orders   Ambulatory referral to Endocrinology   Essential hypertension    Well controlled, no changes to meds. Encouraged heart healthy diet such as the DASH diet and exercise as tolerated.  Nephrology is adjusting meds      Hyperlipidemia    Encouraged heart healthy diet, increase exercise, avoid trans fats, consider a krill oil cap daily      Severe obesity (BMI >= 40) ([place])    Losing weight with daughters cooking for her      Relevant Medications   Insulin Detemir (LEVEMIR FLEXTOUCH) 100 UNIT/ML Pen    Other Visit Diagnoses    Cryptogenic stroke [facility])       Relevant Orders   Ambulatory referral to Cardiology      I have discontinued Erin [name]'s QUEtiapine and insulin detemir. I am also having her start on Insulin Detemir. Additionally, I am having her maintain her ergocalciferol, fluticasone furoate-vilanterol, allopurinol, albuterol, potassium chloride, Fluticasone-Salmeterol, sertraline, torsemide, carvedilol, atorvastatin, fenofibrate, busPIRone, pantoprazole, Insulin Syringe-Needle U-100, escitalopram, ferrous sulfate, pramipexole, clopidogrel, albuterol, sertraline, sitaGLIPtin, Acetaminophen (TYLENOL 8 HOUR PO), blood glucose meter kit and supplies, glucose blood, and Lancets.  Meds ordered this encounter  Medications  . Insulin Detemir (LEVEMIR FLEXTOUCH) 100 UNIT/ML Pen    Sig:  45 u sq qhs    Dispense:  15 mL    Refill:  11    CMA served as scribe during this visit. History, Physical and Plan performed by medical provider. Documentation and orders reviewed and attested to.  Ann Held, DO

## 2017-06-10 NOTE — Telephone Encounter (Signed)
Received Missed Visit Report from [facility]; forwarded to provider/SLS 03/08

## 2017-06-10 NOTE — Telephone Encounter (Signed)
Roxanne with [facility] speech therapy called in to provide PCP with pt's BP reading for today. She said that it is 150/80.   I did advise per providers message that pt is seeing nephrology at [place] [place] for bp. Roxanne said that she will speak with pt to see who she sees and will relay message to them as well.

## 2017-06-10 NOTE — Telephone Encounter (Signed)
Pt seen today

## 2017-06-10 NOTE — Patient Instructions (Signed)

## 2017-06-13 DIAGNOSIS — I69320 Aphasia following cerebral infarction: Secondary | ICD-10-CM | POA: Diagnosis not present

## 2017-06-13 DIAGNOSIS — N183 Chronic kidney disease, stage 3 (moderate): Secondary | ICD-10-CM | POA: Diagnosis not present

## 2017-06-13 DIAGNOSIS — E1122 Type 2 diabetes mellitus with diabetic chronic kidney disease: Secondary | ICD-10-CM | POA: Diagnosis not present

## 2017-06-13 DIAGNOSIS — I130 Hypertensive heart and chronic kidney disease with heart failure and stage 1 through stage 4 chronic kidney disease, or unspecified chronic kidney disease: Secondary | ICD-10-CM | POA: Diagnosis not present

## 2017-06-13 DIAGNOSIS — I5033 Acute on chronic diastolic (congestive) heart failure: Secondary | ICD-10-CM | POA: Diagnosis not present

## 2017-06-13 DIAGNOSIS — I69351 Hemiplegia and hemiparesis following cerebral infarction affecting right dominant side: Secondary | ICD-10-CM | POA: Diagnosis not present

## 2017-06-13 NOTE — Assessment & Plan Note (Signed)
Needs f/u with endo

## 2017-06-13 NOTE — Assessment & Plan Note (Signed)
Encouraged heart healthy diet, increase exercise, avoid trans fats, consider a krill oil cap daily 

## 2017-06-13 NOTE — Assessment & Plan Note (Signed)
Losing weight with daughters cooking for her

## 2017-06-13 NOTE — Assessment & Plan Note (Signed)
Per cardiology 

## 2017-06-13 NOTE — Assessment & Plan Note (Addendum)
Well controlled, no changes to meds. Encouraged heart healthy diet such as the DASH diet and exercise as tolerated.  Nephrology is adjusting meds

## 2017-06-13 NOTE — Assessment & Plan Note (Signed)
Per nephrology 

## 2017-06-14 ENCOUNTER — Ambulatory Visit (INDEPENDENT_AMBULATORY_CARE_PROVIDER_SITE_OTHER): Payer: Medicare Other | Admitting: Adult Health

## 2017-06-14 ENCOUNTER — Telehealth: Payer: Self-pay | Admitting: *Deleted

## 2017-06-14 ENCOUNTER — Encounter: Payer: Self-pay | Admitting: Family Medicine

## 2017-06-14 ENCOUNTER — Encounter: Payer: Self-pay | Admitting: Adult Health

## 2017-06-14 ENCOUNTER — Telehealth: Payer: Self-pay | Admitting: Family Medicine

## 2017-06-14 VITALS — BP 110/66 | HR 70 | Wt 235.8 lb

## 2017-06-14 DIAGNOSIS — E1122 Type 2 diabetes mellitus with diabetic chronic kidney disease: Secondary | ICD-10-CM | POA: Diagnosis not present

## 2017-06-14 DIAGNOSIS — E1151 Type 2 diabetes mellitus with diabetic peripheral angiopathy without gangrene: Secondary | ICD-10-CM

## 2017-06-14 DIAGNOSIS — E785 Hyperlipidemia, unspecified: Secondary | ICD-10-CM | POA: Diagnosis not present

## 2017-06-14 DIAGNOSIS — I639 Cerebral infarction, unspecified: Secondary | ICD-10-CM

## 2017-06-14 DIAGNOSIS — IMO0002 Reserved for concepts with insufficient information to code with codable children: Secondary | ICD-10-CM

## 2017-06-14 DIAGNOSIS — I5033 Acute on chronic diastolic (congestive) heart failure: Secondary | ICD-10-CM | POA: Diagnosis not present

## 2017-06-14 DIAGNOSIS — E1165 Type 2 diabetes mellitus with hyperglycemia: Secondary | ICD-10-CM | POA: Diagnosis not present

## 2017-06-14 DIAGNOSIS — I10 Essential (primary) hypertension: Secondary | ICD-10-CM

## 2017-06-14 DIAGNOSIS — I69320 Aphasia following cerebral infarction: Secondary | ICD-10-CM | POA: Diagnosis not present

## 2017-06-14 DIAGNOSIS — I69351 Hemiplegia and hemiparesis following cerebral infarction affecting right dominant side: Secondary | ICD-10-CM | POA: Diagnosis not present

## 2017-06-14 DIAGNOSIS — N183 Chronic kidney disease, stage 3 (moderate): Secondary | ICD-10-CM | POA: Diagnosis not present

## 2017-06-14 DIAGNOSIS — I130 Hypertensive heart and chronic kidney disease with heart failure and stage 1 through stage 4 chronic kidney disease, or unspecified chronic kidney disease: Secondary | ICD-10-CM | POA: Diagnosis not present

## 2017-06-14 NOTE — Progress Notes (Deleted)
[facility] [address] [place]. [place] [address] [phone]       OFFICE CONSULT NOTE  [name] Date of Birth:  1945/07/06 Medical Record Number:  [id number]   Referring MD: Martie Lee Reason for Referral:  Stroke  HPI: Erin [name] is a pleasant 72 year old Caucasian lady who is accompanied today by her daughter who provides most of the history. I have also reviewed patient's recent hospitalization records at [facility] as well as [facility] in care everywhere. I was unable to locate actual imaging films . She developed sudden onset of aphasia and right hemiplegia and was taken to [facility]. CT scan of the head showed slight hemorrhagic transformation into large left MCA infarct. NIH stroke scale was 14. She was outside the time window for thrombolysis and was not felt to be candidate for mechanical thrombectomy. She was admitted at [facility] where carotid ultrasound and transcranial Doppler studies were obtained which were unremarkable. Transthoracic echo showed ejection fraction of 65% with some mild diastolic dysfunction but no evidence of patent foramen ovale or cardiac source of embolism. Telemetry monitoring showed normal sinus rhythm. Hemoglobin A1c was elevated at 11.8 and LDL cholesterol was 53 mg percent. She was seen by physical occupational speech therapy and transferred for ongoing rehabilitation needs to a skilled nursing facility and [place] county.  History 05/16/17: Since discharge, she is up in improvement in her right-sided strength and is able to walk with a cane and can ambulate even up to 200 feet. She does get short of breath due to her CHF and that is more of limiting factor in a walking. Her speech and language have improved but she still has significant trouble with understanding and speaking. The patient is tolerating Plavix well without bleeding or  bruising. Her blood pressure is usually well controlled . She is getting physical occupational and speech therapy. Family is planning to bring her home soon and she will live with her son initially at least until she recovers and is able to live by herself.  Update 06/14/17:     ROS:   14 system review of systems is positive for  leg swelling, shortness of breath, confusion, speech difficulties and all other systems negative PMH:  Past Medical History:  Diagnosis Date  . Asthma   . CHF (congestive heart failure) [facility])    hospital 11/2013-- [place]  . Chronic kidney disease   . Depression   . Diabetes mellitus ([place])   . GERD (gastroesophageal reflux disease)   . Hyperlipidemia   . Hypertension   . Stroke [facility])     Social History:  Social History   Socioeconomic History  . Marital status: Widowed    Spouse name: Not on file  . Number of children: 4  . Years of education: Not on file  . Highest education level: Not on file  Social Needs  . Financial resource strain: Not on file  . Food insecurity - worry: Not on file  . Food insecurity - inability: Not on file  . Transportation needs - medical: Not on file  . Transportation needs - non-medical: Not on file  Occupational History  . Occupation: [employer] @ [place]: [employer]  Tobacco Use  . Smoking status: Former Smoker    Packs/day: 1.50    Years: 20.00    Pack years: 30.00    Types: Cigarettes    Last  attempt to quit: 08/02/1984    Years since quitting: 32.8  . Smokeless tobacco: Never Used  Substance and Sexual Activity  . Alcohol use: No  . Drug use: No  . Sexual activity: Not Currently    Partners: Male  Other Topics Concern  . Not on file  Social History Narrative   NO REG EXERCISE   Caffeine use: daily    Medications:   Current Outpatient Medications on File Prior to Visit  Medication Sig Dispense Refill  . Acetaminophen (TYLENOL 8 HOUR PO) Take 500 mg by mouth.    Marland Kitchen albuterol  (PROVENTIL) (2.5 MG/3ML) 0.083% nebulizer solution Take 3 mLs (2.5 mg total) by nebulization every 6 (six) hours as needed for wheezing or shortness of breath. (Patient not taking: Reported on 06/10/2017) 75 mL 12  . albuterol (VENTOLIN HFA) 108 (90 Base) MCG/ACT inhaler Inhale into the lungs.    Marland Kitchen allopurinol (ZYLOPRIM) 100 MG tablet Take 1 tablet by mouth 2 (two) times daily.    Marland Kitchen atorvastatin (LIPITOR) 40 MG tablet TAKE 1 TABLET BY MOUTH ONCE DAILY 90 tablet 1  . blood glucose meter kit and supplies Dispense based on patient and insurance preference. Use once day as directed.  Dx Code: E11.51 1 each 0  . busPIRone (BUSPAR) 15 MG tablet Take 1 tablet (15 mg total) 3 (three) times daily by mouth. 90 tablet 1  . carvedilol (COREG) 6.25 MG tablet TAKE 1 TABLET BY MOUTH EVERY 12 HOURS 180 tablet 3  . clopidogrel (PLAVIX) 75 MG tablet Take by mouth.    . ergocalciferol (VITAMIN D2) 50000 UNITS capsule Take 50,000 Units by mouth once a week.    . escitalopram (LEXAPRO) 10 MG tablet Take 1 tablet (10 mg total) by mouth daily. 30 tablet 2  . fenofibrate 160 MG tablet Take 1 tablet (160 mg total) daily by mouth. 90 tablet 2  . ferrous sulfate 325 (65 FE) MG tablet Take 325 mg by mouth.    . Fluticasone Furoate-Vilanterol (BREO ELLIPTA) 200-25 MCG/INH AEPB Inhale 1 puff into the lungs daily.    . Fluticasone-Salmeterol (ADVAIR) 250-50 MCG/DOSE AEPB Inhale 1 puff into the lungs 2 (two) times daily.    Marland Kitchen glucose blood test strip Use as directed once a day.  DX code: E11.51 100 each 1  . Insulin Detemir (LEVEMIR FLEXTOUCH) 100 UNIT/ML Pen 45 u sq qhs 15 mL 11  . Insulin Syringe-Needle U-100 (B-D INS SYR ULTRAFINE .5CC/30G) 30G X 1/2" 0.5 ML MISC Use 3x a day 300 each 3  . Lancets MISC Use as directed once a day.  DX code: E11.51 100 each 1  . pantoprazole (PROTONIX) 40 MG tablet Take 1 tablet (40 mg total) by mouth daily. 90 tablet 1  . potassium chloride (K-DUR,KLOR-CON) 10 MEQ tablet Take by mouth.    .  pramipexole (MIRAPEX) 1 MG tablet Take 1 tablet (1 mg total) by mouth at bedtime. 90 tablet 0  . sertraline (ZOLOFT) 50 MG tablet Take 1.5 tablets (75 mg total) by mouth daily. 135 tablet 3  . sertraline (ZOLOFT) 50 MG tablet Take by mouth.    . sitaGLIPtin (JANUVIA) 50 MG tablet Take by mouth.    . torsemide (DEMADEX) 20 MG tablet TAKE FOUR TABLETS BY MOUTH TWICE DAILY     No current facility-administered medications on file prior to visit.     Allergies:  No Known Allergies  Physical Exam General: Obese elderly Caucasian lady seated, in no evident distress Head: head normocephalic and  atraumatic.   Neck: supple with no carotid or supraclavicular bruits Cardiovascular: regular rate and rhythm, no murmurs Musculoskeletal: no deformity Skin:  no rash/petichiae Vascular:  Normal pulses all extremities  Neurologic Exam Mental Status: Awake and fully alert. Moderate receptive and mild expressive aphasia with nonfluent speech with paraphasic errors and some word hesitancy. Unable to name repeat right.  Cranial Nerves: Fundoscopic exam reveals sharp disc margins. Pupils equal, briskly reactive to light. Extraocular movements full without nystagmus. Visual fields show partial right homonymous hemianopsia to confrontation. Hearing intact. Facial sensation intact. Face, tongue, palate moves normally and symmetrically.  Motor: Normal bulk and tone. Normal strength in all tested extremity muscles except mild weakness of right hip flexors and ankle dorsiflexors only.. Sensory.: intact to touch , pinprick , position and vibratory sensation.  Coordination: Rapid alternating movements normal in all extremities. Finger-to-nose and heel-to-shin performed accurately bilaterally. Gait and Station: Arises from chair without difficulty. Stance is normal. Gait demonstrates  slight wide-based with favoring of the right leg. Uses a cane. Unable to do tandem walking .  Reflexes: 1+ and symmetric. Toes downgoing.     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 She has obtained improvement in hemiparesis but significant aphasia persists    PLAN:       She will need outpatient transesophageal echocardiogram and loop recorder insertion for paroxysmal atrial fibrillation but will arrange this at next visit after she is no longer in a skilled nursing facility and at home      Venancio Poisson, [facility]  [facility] [address] [place], [place] [address]  Phone [phone] Fax [phone]

## 2017-06-14 NOTE — Progress Notes (Signed)
[facility] [address] [place]. [place] [address] [phone]       OFFICE FOLLOW UP NOTE  Ms. AAMNA MALLOZZI Date of Birth:  [birth year] Medical Record Number:  [id number]   Referring MD: Martie Lee Reason for Referral:  Stroke   HPI: Erin [name] is a pleasant 72 year old Caucasian lady who is accompanied today by her daughter who provides most of the history. I have also reviewed patient's recent hospitalization records at [facility] as well as [facility] in care everywhere. I was unable to locate actual imaging films . She developed sudden onset of aphasia and right hemiplegia and was taken to [facility]. CT scan of the head showed slight hemorrhagic transformation into large left MCA infarct. NIH stroke scale was 14. She was outside the time window for thrombolysis and was not felt to be candidate for mechanical thrombectomy. She was admitted at [facility] where carotid ultrasound and transcranial Doppler studies were obtained which were unremarkable. Transthoracic echo showed ejection fraction of 65% with some mild diastolic dysfunction but no evidence of patent foramen ovale or cardiac source of embolism. Telemetry monitoring showed normal sinus rhythm. Hemoglobin A1c was elevated at 11.8 and LDL cholesterol was 53 mg percent. She was seen by physical occupational speech therapy and transferred for ongoing rehabilitation needs to a skilled nursing facility and [place] county.  History 05/16/17: She is up in improvement in her right-sided strength and is able to walk with a cane and can ambulate even up to 200 feet. She does get short of breath due to her CHF and that is more of limiting factor in a walking. Her speech and language have improved but she still has significant trouble with understanding and speaking. The patient is tolerating Plavix well without bleeding or bruising. Her blood  pressure is usually well controlled . She is getting physical occupational and speech therapy. Family is planning to bring her home soon and she will live with her son initially at least until she recovers and is able to live by herself.  Update 06/14/17 JV : Patient returns today for 4-week follow-up.  She is accompanied today by her 2 daughters.  She continues to have home PT/OT/SLP twice a week along with a home health care nurse 2 times a week.  Continues to take Plavix without increase in bleeding or bruising.  Continues to take Lipitor without side effects of myalgias.  Daughter concerned about recent blood pressure that ranges from systolics 063K-160F.  Blood pressure was checked manually at today's appointment at 140/68.  Daughter questioning BP parameters and was advised systolics should remain between 130-140 from a neurological standpoint.  Daughter will attempt to have nephrologist appointment sooner for possible blood pressure medication adjustments. Most recent A1c 11.2 on 04/27/2017.  Patient will be following up with endocrinologist.  Patient's daughter is also scheduling cardiologist appointment.  Discussed TEE procedure with possible loop recorder placement as this was unable to be done at previous appointment due to patient living at skilled nursing facility.  Per daughter, patient does have mild difficulty with short-term memory but has no other memory complaints.  Patient denies increase or new stroke/TIA symptoms.  ROS:  14 system review of systems is positive for memory loss and speech difficulty and all other systems negative   PMH:  Past Medical History:  Diagnosis Date  . Asthma   . CHF (congestive heart failure) [facility])    [place]  11[place]2013[place]  . Chronic kidney disease   . Depression   . Diabetes mellitus ([place])   . GERD (gastroesophageal reflux disease)   . Hyperlipidemia   . Hypertension   . Stroke [facility])     Social History:  Social History   Socioeconomic  History  . Marital status: Widowed    Spouse name: Not on file  . Number of children: 4  . Years of education: Not on file  . Highest education level: Not on file  Social Needs  . Financial resource strain: Not on file  . Food insecurity - worry: Not on file  . Food insecurity - inability: Not on file  . Transportation needs - medical: Not on file  . Transportation needs - non-medical: Not on file  Occupational History  . Occupation: [employer] @ [place]: [employer]  Tobacco Use  . Smoking status: Former Smoker    Packs/day: 1.50    Years: 20.00    Pack years: 30.00    Types: Cigarettes    Last attempt to quit: 08/02/1984    Years since quitting: 32.8  . Smokeless tobacco: Never Used  Substance and Sexual Activity  . Alcohol use: No  . Drug use: No  . Sexual activity: Not Currently    Partners: Male  Other Topics Concern  . Not on file  Social History Narrative   NO REG EXERCISE   Caffeine use: daily    Medications:   Current Outpatient Medications on File Prior to Visit  Medication Sig Dispense Refill  . Acetaminophen (TYLENOL 8 HOUR PO) Take 500 mg by mouth.    Marland Kitchen albuterol (PROVENTIL) (2.5 MG/3ML) 0.083% nebulizer solution Take 3 mLs (2.5 mg total) by nebulization every 6 (six) hours as needed for wheezing or shortness of breath. 75 mL 12  . albuterol (VENTOLIN HFA) 108 (90 Base) MCG/ACT inhaler Inhale into the lungs.    Marland Kitchen allopurinol (ZYLOPRIM) 100 MG tablet Take 1 tablet by mouth 2 (two) times daily.    Marland Kitchen atorvastatin (LIPITOR) 40 MG tablet TAKE 1 TABLET BY MOUTH ONCE DAILY 90 tablet 1  . blood glucose meter kit and supplies Dispense based on patient and insurance preference. Use once day as directed.  Dx Code: E11.51 1 each 0  . busPIRone (BUSPAR) 15 MG tablet Take 1 tablet (15 mg total) 3 (three) times daily by mouth. 90 tablet 1  . carvedilol (COREG) 6.25 MG tablet TAKE 1 TABLET BY MOUTH EVERY 12 HOURS 180 tablet 3  . clopidogrel (PLAVIX) 75 MG  tablet Take by mouth.    . ergocalciferol (VITAMIN D2) 50000 UNITS capsule Take 50,000 Units by mouth once a week.    . escitalopram (LEXAPRO) 10 MG tablet Take 1 tablet (10 mg total) by mouth daily. 30 tablet 2  . fenofibrate 160 MG tablet Take 1 tablet (160 mg total) daily by mouth. 90 tablet 2  . ferrous sulfate 325 (65 FE) MG tablet Take 325 mg by mouth.    . Fluticasone Furoate-Vilanterol (BREO ELLIPTA) 200-25 MCG/INH AEPB Inhale 1 puff into the lungs daily.    . Fluticasone-Salmeterol (ADVAIR) 250-50 MCG/DOSE AEPB Inhale 1 puff into the lungs 2 (two) times daily.    Marland Kitchen glucose blood test strip Use as directed once a day.  DX code: E11.51 100 each 1  . Insulin Detemir (LEVEMIR FLEXTOUCH) 100 UNIT/ML Pen 45 u sq qhs 15 mL 11  . Insulin Syringe-Needle U-100 (B-D INS SYR ULTRAFINE .5CC/30G) 30G X  1/2" 0.5 ML MISC Use 3x a day 300 each 3  . Lancets MISC Use as directed once a day.  DX code: E11.51 100 each 1  . pantoprazole (PROTONIX) 40 MG tablet Take 1 tablet (40 mg total) by mouth daily. 90 tablet 1  . potassium chloride (K-DUR,KLOR-CON) 10 MEQ tablet Take by mouth.    . pramipexole (MIRAPEX) 1 MG tablet Take 1 tablet (1 mg total) by mouth at bedtime. 90 tablet 0  . sertraline (ZOLOFT) 50 MG tablet Take 1.5 tablets (75 mg total) by mouth daily. 135 tablet 3  . sitaGLIPtin (JANUVIA) 50 MG tablet Take by mouth.    . torsemide (DEMADEX) 20 MG tablet TAKE FOUR TABLETS BY MOUTH TWICE DAILY     No current facility-administered medications on file prior to visit.     Allergies:  No Known Allergies  Physical Exam General: Obese elderly Caucasian lady seated, in no evident distress Head: head normocephalic and atraumatic.   Neck: supple with no carotid or supraclavicular bruits Cardiovascular: regular rate and rhythm, no murmurs Musculoskeletal: no deformity Skin:  no rash/petichiae Vascular:  Normal pulses all extremities  Neurologic Exam Mental Status: Awake and fully alert. Receptive <  expressive aphasia with nonfluent speech with paraphasic errors and some word hesitancy. Unable to name objects. Able to repeat words with prompting.  Cranial Nerves: Fundoscopic exam reveals sharp disc margins. Pupils equal, briskly reactive to light. Extraocular movements full without nystagmus. Hearing intact. Facial sensation intact. Face, tongue, palate moves normally and symmetrically.   Motor: Normal bulk and tone. Normal strength in all tested extremity muscles except mild weakness of right hip flexors and right ankle dorsiflexors only.. Sensory.: intact to touch , pinprick , position and vibratory sensation.  Coordination: Rapid alternating movements normal in all extremities. Finger-to-nose and heel-to-shin performed accurately bilaterally. Gait and Station: Arises from chair without difficulty. Stance is normal. Gait demonstrates  slight wide-based with favoring of the right leg. Uses a cane. Unable to do tandem walking .  Reflexes: 1+ and symmetric. Toes downgoing.   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 She has obtained improvement in right hemiparesis but significant aphasia persists, expressive > receptive.  Patient has returned home from skilled nursing facility and she continues therapies at home.  Greatest concern at today's appointment is blood pressure control.   PLAN: -Continue clopidogrel 75 mg daily  and lipitor 76m  for secondary stroke prevention -referral for TEE and possible loop recorder placement -Continue home PT/OT/SLP - will contact [place] needed with orders for outpatient therapies -Follow up with endocrinologist for diabetes control -Follow up with cardiologist and nephrologist regarding blood pressure concerns -  continue to monitor with logs - advised to ensure fluid restriction of 1.5L per nephrologist and avoid sodium, carbs and excess fats -Maintain strict control of hypertension with blood pressure goal below 130/90, diabetes with hemoglobin A1c goal below 6.5% and cholesterol with LDL cholesterol (bad cholesterol) goal below 70 mg/dL. I also advised the patient to eat a healthy diet with plenty of whole grains, cereals, fruits and vegetables, exercise regularly and maintain ideal body weight. -Followup in the future with me in 6 months   Greater than 50% time during this 25 minute consultation visit was spent on counseling and coordination of care about  HTN, HLD, and DM (risk factors), discussion about risk benefit of anticoagulation and answering questions. Discussed importance of TEE study and possible loop recorder placement. Education regarding atrial fibrillation and increased risk for stroke.   Venancio Poisson, AGNP-BC  [facility] [address] [place], [place] [address]  Phone [phone] Fax [phone]

## 2017-06-14 NOTE — Telephone Encounter (Signed)
Copied from [place] [id number]. Topic: Quick Communication - See Telephone Encounter >> Jun 13, 2017  4:25 PM Arletha Grippe wrote: CRM for notification. See Telephone encounter for:   06/13/17.leslie kidd called from [facility]  Verbal orders -  OT 2 week 3 Cb [phone] Leave detailed message  Also, she wants you to know that bp was 155/78 with no other symptoms.

## 2017-06-14 NOTE — Patient Instructions (Signed)
Continue clopidogrel 75 mg daily  and lipitor 40mg   for secondary stroke prevention  We will call you to schedule appointment for TEE and possible loop recorder placement  Continue home PT/OT/SLP - let us know if you need assistance with referrals on outpatient therapies once home therapies are complete  Follow up with endocrinologist for diabetes control  Follow up with cardiologist and nephrologist regarding blood pressure concerns - continue to monitor with logs    Maintain strict control of hypertension with blood pressure goal below 130/90, diabetes with hemoglobin A1c goal below 6.5% and cholesterol with LDL cholesterol (bad cholesterol) goal below 70 mg/dL. I also advised the patient to eat a healthy diet with plenty of whole grains, cereals, fruits and vegetables, exercise regularly and maintain ideal body weight.  Followup in the future with me in 6 months

## 2017-06-14 NOTE — Telephone Encounter (Signed)
Copied from [place]. Topic: Quick Communication - See Telephone Encounter >> Jun 14, 2017  3:26 PM Neva Seat wrote: [place] [phone] Can leave a message on safe answering machine.  Verbal Orders:  2 times a week for 3 weeks for O T

## 2017-06-14 NOTE — Telephone Encounter (Signed)
Copied from [place] [phone]. Topic: Quick Communication - See Telephone Encounter >> Jun 14, 2017  3:07 PM Margot Ables wrote: CRM for notification. See Telephone encounter for: 06/14/17.  Erin [name] is requesting f/u call from office to notify if ok to use agency std for BP monitoring. Generally they report if BP >160. Pts daughter is extremely worried bc the facility she was in previously notified if anything >130. The nephrologist said they will not be following for BP.

## 2017-06-15 MED ORDER — SITAGLIPTIN PHOSPHATE 50 MG PO TABS: 50.0000 mg | ORAL_TABLET | Freq: Every day | ORAL | 3 refills | Status: DC

## 2017-06-15 NOTE — Telephone Encounter (Signed)
Erin [name] calling back about orders for agency to use standard for Blood Pressure monitoring

## 2017-06-15 NOTE — Telephone Encounter (Signed)
Verbal orders given  

## 2017-06-15 NOTE — Telephone Encounter (Signed)
Gave verbal orders. 

## 2017-06-16 ENCOUNTER — Ambulatory Visit: Payer: Self-pay | Admitting: Neurology

## 2017-06-16 DIAGNOSIS — I130 Hypertensive heart and chronic kidney disease with heart failure and stage 1 through stage 4 chronic kidney disease, or unspecified chronic kidney disease: Secondary | ICD-10-CM | POA: Diagnosis not present

## 2017-06-16 DIAGNOSIS — E1122 Type 2 diabetes mellitus with diabetic chronic kidney disease: Secondary | ICD-10-CM | POA: Diagnosis not present

## 2017-06-16 DIAGNOSIS — I5033 Acute on chronic diastolic (congestive) heart failure: Secondary | ICD-10-CM | POA: Diagnosis not present

## 2017-06-16 DIAGNOSIS — I69351 Hemiplegia and hemiparesis following cerebral infarction affecting right dominant side: Secondary | ICD-10-CM | POA: Diagnosis not present

## 2017-06-16 DIAGNOSIS — I69320 Aphasia following cerebral infarction: Secondary | ICD-10-CM | POA: Diagnosis not present

## 2017-06-16 DIAGNOSIS — N183 Chronic kidney disease, stage 3 (moderate): Secondary | ICD-10-CM | POA: Diagnosis not present

## 2017-06-16 NOTE — Telephone Encounter (Signed)
Speak to daughter --- I prefer bp not be 160---  Are they running high every day?

## 2017-06-17 ENCOUNTER — Telehealth: Payer: Self-pay | Admitting: *Deleted

## 2017-06-17 DIAGNOSIS — I5033 Acute on chronic diastolic (congestive) heart failure: Secondary | ICD-10-CM | POA: Diagnosis not present

## 2017-06-17 DIAGNOSIS — E1122 Type 2 diabetes mellitus with diabetic chronic kidney disease: Secondary | ICD-10-CM | POA: Diagnosis not present

## 2017-06-17 DIAGNOSIS — I69320 Aphasia following cerebral infarction: Secondary | ICD-10-CM | POA: Diagnosis not present

## 2017-06-17 DIAGNOSIS — I130 Hypertensive heart and chronic kidney disease with heart failure and stage 1 through stage 4 chronic kidney disease, or unspecified chronic kidney disease: Secondary | ICD-10-CM | POA: Diagnosis not present

## 2017-06-17 DIAGNOSIS — I69351 Hemiplegia and hemiparesis following cerebral infarction affecting right dominant side: Secondary | ICD-10-CM | POA: Diagnosis not present

## 2017-06-17 DIAGNOSIS — N183 Chronic kidney disease, stage 3 (moderate): Secondary | ICD-10-CM | POA: Diagnosis not present

## 2017-06-17 NOTE — Telephone Encounter (Signed)
Copied from [place]. Topic: Inquiry >> Jun 17, 2017 12:59 PM Oliver Pila B wrote: Reason for CRM: [facility] called to states pt's bp is 148/68 today, contact pt if needed, speech therapist states pt is alone in the home at the moment, contact [place] [phone]

## 2017-06-17 NOTE — Telephone Encounter (Signed)
Advised Erin [name] per Dr. Etter Sjogren >130/>90 for blood pressure

## 2017-06-19 ENCOUNTER — Other Ambulatory Visit: Payer: Self-pay | Admitting: Family Medicine

## 2017-06-20 ENCOUNTER — Encounter: Payer: Self-pay | Admitting: Internal Medicine

## 2017-06-20 ENCOUNTER — Encounter: Payer: Self-pay | Admitting: Family Medicine

## 2017-06-20 ENCOUNTER — Telehealth: Payer: Self-pay | Admitting: Internal Medicine

## 2017-06-20 DIAGNOSIS — E1122 Type 2 diabetes mellitus with diabetic chronic kidney disease: Secondary | ICD-10-CM | POA: Diagnosis not present

## 2017-06-20 DIAGNOSIS — I130 Hypertensive heart and chronic kidney disease with heart failure and stage 1 through stage 4 chronic kidney disease, or unspecified chronic kidney disease: Secondary | ICD-10-CM | POA: Diagnosis not present

## 2017-06-20 DIAGNOSIS — I5033 Acute on chronic diastolic (congestive) heart failure: Secondary | ICD-10-CM | POA: Diagnosis not present

## 2017-06-20 DIAGNOSIS — I69351 Hemiplegia and hemiparesis following cerebral infarction affecting right dominant side: Secondary | ICD-10-CM | POA: Diagnosis not present

## 2017-06-20 DIAGNOSIS — I69320 Aphasia following cerebral infarction: Secondary | ICD-10-CM | POA: Diagnosis not present

## 2017-06-20 DIAGNOSIS — N183 Chronic kidney disease, stage 3 (moderate): Secondary | ICD-10-CM | POA: Diagnosis not present

## 2017-06-20 MED ORDER — PEN NEEDLES 32G X 4 MM MISC: 1.0000 | Freq: Every day | 1 refills | Status: DC

## 2017-06-20 NOTE — Telephone Encounter (Signed)
i'm ok with 140s

## 2017-06-20 NOTE — Telephone Encounter (Signed)
Keep under 150/90

## 2017-06-20 NOTE — Telephone Encounter (Signed)
Spoke with Erin [name].  She asked if you want to take up the bp to call if it is over >150/>90?  Patient blood pressure has been running in the upper 140s.  We will get calls daily about her bp.

## 2017-06-20 NOTE — Telephone Encounter (Signed)
Patients daughter stated that pt had stroke in [place]. And in hospital they changed her insulin   They changed her from [place] (if pts sugar is over 200 she gets 4 units) she went to [facility] and patients daughter is unsure of dosage she was changed too.   Patients Daughter would like to know if dr would want to keep her on the [place] and would like to know if the dosage would still be the same.   Please advise

## 2017-06-20 NOTE — Telephone Encounter (Signed)
Erin [name] calling to report BP today is 164/74. Blood sugar is also 282 but after her snack which was a protein bar. Before anything to eat this morning it was 234.

## 2017-06-20 NOTE — Progress Notes (Signed)
I agree with the above plan 

## 2017-06-21 ENCOUNTER — Encounter: Payer: Self-pay | Admitting: Family Medicine

## 2017-06-21 DIAGNOSIS — I69320 Aphasia following cerebral infarction: Secondary | ICD-10-CM | POA: Diagnosis not present

## 2017-06-21 DIAGNOSIS — I5033 Acute on chronic diastolic (congestive) heart failure: Secondary | ICD-10-CM | POA: Diagnosis not present

## 2017-06-21 DIAGNOSIS — I69351 Hemiplegia and hemiparesis following cerebral infarction affecting right dominant side: Secondary | ICD-10-CM | POA: Diagnosis not present

## 2017-06-21 DIAGNOSIS — N183 Chronic kidney disease, stage 3 (moderate): Secondary | ICD-10-CM | POA: Diagnosis not present

## 2017-06-21 DIAGNOSIS — E1122 Type 2 diabetes mellitus with diabetic chronic kidney disease: Secondary | ICD-10-CM | POA: Diagnosis not present

## 2017-06-21 DIAGNOSIS — I130 Hypertensive heart and chronic kidney disease with heart failure and stage 1 through stage 4 chronic kidney disease, or unspecified chronic kidney disease: Secondary | ICD-10-CM | POA: Diagnosis not present

## 2017-06-21 NOTE — Telephone Encounter (Signed)
Inc coreg to 12.5 mg bid #60  Does she have an appointment with endo --- they may need to call endo

## 2017-06-21 NOTE — Telephone Encounter (Signed)
Called daughter. No answer. Pt will need to be scheduled for OV w/ Dr. Darnell Level earlier than appt already scheduled in May. See if daughter can get the correct dosage pt was changed to recently.

## 2017-06-21 NOTE — Telephone Encounter (Signed)
I have inc her bp meds Ok to give mucinex and mucinex dm

## 2017-06-22 ENCOUNTER — Telehealth: Payer: Self-pay | Admitting: Internal Medicine

## 2017-06-22 ENCOUNTER — Other Ambulatory Visit: Payer: Self-pay

## 2017-06-22 DIAGNOSIS — E1122 Type 2 diabetes mellitus with diabetic chronic kidney disease: Secondary | ICD-10-CM | POA: Diagnosis not present

## 2017-06-22 DIAGNOSIS — I69351 Hemiplegia and hemiparesis following cerebral infarction affecting right dominant side: Secondary | ICD-10-CM | POA: Diagnosis not present

## 2017-06-22 DIAGNOSIS — I5033 Acute on chronic diastolic (congestive) heart failure: Secondary | ICD-10-CM | POA: Diagnosis not present

## 2017-06-22 DIAGNOSIS — N183 Chronic kidney disease, stage 3 (moderate): Secondary | ICD-10-CM | POA: Diagnosis not present

## 2017-06-22 DIAGNOSIS — I69320 Aphasia following cerebral infarction: Secondary | ICD-10-CM | POA: Diagnosis not present

## 2017-06-22 DIAGNOSIS — I130 Hypertensive heart and chronic kidney disease with heart failure and stage 1 through stage 4 chronic kidney disease, or unspecified chronic kidney disease: Secondary | ICD-10-CM | POA: Diagnosis not present

## 2017-06-22 MED ORDER — CARVEDILOL 12.5 MG PO TABS: 12.5000 mg | ORAL_TABLET | Freq: Two times a day (BID) | ORAL | 0 refills | Status: DC

## 2017-06-22 MED ORDER — INSULIN ASPART 100 UNIT/ML FLEXPEN: 4.0000 [IU] | PEN_INJECTOR | Freq: Three times a day (TID) | SUBCUTANEOUS | 11 refills | Status: DC

## 2017-06-22 NOTE — Telephone Encounter (Signed)
Spoke to daughter Erin [name]. Mardene Celeste able to schedule pt w/ Dr. Darnell Level for tomorrow. Dghtr states pt is out of insulin as of this a.m., B/S over 200. Requesting insulin and dosage.

## 2017-06-22 NOTE — Telephone Encounter (Signed)
Patient's daughter notified rx for novolog has been sent to cover the patient for tonight. Patient has scheduled an appointment to see Dr. Cruzita Lederer tomorrow.

## 2017-06-22 NOTE — Telephone Encounter (Signed)
I refilled

## 2017-06-22 NOTE — Telephone Encounter (Signed)
Left message on machine.

## 2017-06-22 NOTE — Telephone Encounter (Signed)
In hospital due to stroke hospital changed patient's insulin from Novalin to Novalog. Erin [name] called on Monday and has not heard back from [place]. Please call her to discuss. She needs to get her mother's insulin immediately. Again call her at ph# [phone].

## 2017-06-22 NOTE — Telephone Encounter (Signed)
Patient called in and scheduled with Dr. Cruzita Lederer for 06/23/2017.

## 2017-06-22 NOTE — Telephone Encounter (Signed)
Daughter Marcie Bal called ph# [phone]. She has been waiting to get a call from our office. Her mother had a stroke-Janet wants you to call her at the above number. The issue is patient is out of insulin. When she was in the hospital due to

## 2017-06-23 ENCOUNTER — Ambulatory Visit (INDEPENDENT_AMBULATORY_CARE_PROVIDER_SITE_OTHER): Payer: Medicare Other | Admitting: Internal Medicine

## 2017-06-23 ENCOUNTER — Telehealth: Payer: Self-pay | Admitting: *Deleted

## 2017-06-23 ENCOUNTER — Encounter: Payer: Self-pay | Admitting: Internal Medicine

## 2017-06-23 VITALS — BP 136/74 | HR 75 | Temp 97.9°F | Resp 16 | Ht 64.0 in | Wt 232.1 lb

## 2017-06-23 VITALS — BP 118/64 | HR 78 | Ht 64.0 in | Wt 232.2 lb

## 2017-06-23 DIAGNOSIS — L600 Ingrowing nail: Secondary | ICD-10-CM

## 2017-06-23 DIAGNOSIS — M109 Gout, unspecified: Secondary | ICD-10-CM

## 2017-06-23 DIAGNOSIS — I639 Cerebral infarction, unspecified: Secondary | ICD-10-CM

## 2017-06-23 DIAGNOSIS — E785 Hyperlipidemia, unspecified: Secondary | ICD-10-CM

## 2017-06-23 DIAGNOSIS — J441 Chronic obstructive pulmonary disease with (acute) exacerbation: Secondary | ICD-10-CM | POA: Diagnosis not present

## 2017-06-23 DIAGNOSIS — IMO0002 Reserved for concepts with insufficient information to code with codable children: Secondary | ICD-10-CM

## 2017-06-23 DIAGNOSIS — E1151 Type 2 diabetes mellitus with diabetic peripheral angiopathy without gangrene: Secondary | ICD-10-CM | POA: Diagnosis not present

## 2017-06-23 DIAGNOSIS — E1165 Type 2 diabetes mellitus with hyperglycemia: Secondary | ICD-10-CM | POA: Diagnosis not present

## 2017-06-23 MED ORDER — INSULIN ASPART 100 UNIT/ML FLEXPEN
PEN_INJECTOR | SUBCUTANEOUS | 3 refills | Status: DC
Start: 2017-06-23 — End: 2017-12-23

## 2017-06-23 MED ORDER — CEPHALEXIN 500 MG PO CAPS
500.0000 mg | ORAL_CAPSULE | Freq: Four times a day (QID) | ORAL | 0 refills | Status: DC
Start: 2017-06-23 — End: 2017-06-24

## 2017-06-23 MED ORDER — CEFUROXIME AXETIL 500 MG PO TABS: 500.0000 mg | ORAL_TABLET | Freq: Two times a day (BID) | ORAL | 0 refills | Status: DC

## 2017-06-23 MED ORDER — CLOPIDOGREL BISULFATE 75 MG PO TABS: 75.0000 mg | ORAL_TABLET | Freq: Every day | ORAL | 1 refills | Status: DC

## 2017-06-23 MED ORDER — ALLOPURINOL 100 MG PO TABS: 100.0000 mg | ORAL_TABLET | Freq: Every day | ORAL | 1 refills | Status: DC

## 2017-06-23 MED ORDER — BASAGLAR KWIKPEN 100 UNIT/ML ~~LOC~~ SOPN: 40.0000 [IU] | PEN_INJECTOR | Freq: Every day | SUBCUTANEOUS | 3 refills | Status: DC

## 2017-06-23 NOTE — Patient Instructions (Addendum)
Please change: - from Levemir to Basaglar 40 units at bedtime  Please change NovoLog - 10-15 min: - 8-10 units before each meal  If sugars 60-80, give only 50% of the dose If sugars <60, hold NovoLog  Stop Januvia.  Check sugars 3x a day.  Please see [place].  Please return in 1.5 months with your sugar log.
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
## 2017-06-23 NOTE — Patient Instructions (Addendum)
Take antibiotics as prescribed: CEFTIN (no chephalexin)  Soak the toe in warm, salty water  Apply a topical antibiotic  Please see the doctor again quickly if that redness, swelling and pain increases.  Cough: Continue with Advair twice a day  Albuterol, rescue inhaler as needed for cough and chest congestion  Take Ceftin for 1 week.

## 2017-06-23 NOTE — Progress Notes (Signed)
Pre visit review using our clinic review tool, if applicable. No additional management support is needed unless otherwise documented below in the visit note. 

## 2017-06-23 NOTE — Telephone Encounter (Signed)
Received Home Health Certification and Plan of Care and also, Physician Order; forwarded to provider/SLS 03/21

## 2017-06-23 NOTE — Progress Notes (Signed)
Subjective:    Patient ID: Erin [name], female    DOB: 03/15/1946, 72 y.o.   MRN: [id number]  DOS:  06/23/2017 Type of visit - description : Acute visit Interval history: Here with her daughter who reports all her symptoms, the patient herself is a poor historian due to strokes. 3 days history of left great toe pain.  Gout?. Also 1 week history of chest congestion, using Mucinex.  + Cough with yellow sputum production.  History of COPD.   Review of Systems No fever chills No shortness of breath or wheezing No nausea, vomiting, diarrhea. Mental status at baseline.   Past Medical History:  Diagnosis Date  . Asthma   . CHF (congestive heart failure) [facility])    hospital 11/2013-- [place]  . Chronic kidney disease   . Depression   . Diabetes mellitus ([place])   . GERD (gastroesophageal reflux disease)   . Hyperlipidemia   . Hypertension   . Stroke [facility])     Past Surgical History:  Procedure Laterality Date  . CHOLECYSTECTOMY  2002  . SPINE SURGERY  aug 2015   [facility]  . TONSILLECTOMY    . TUBAL LIGATION      Social History   Socioeconomic History  . Marital status: Widowed    Spouse name: Not on file  . Number of children: 4  . Years of education: Not on file  . Highest education level: Not on file  Occupational History  . 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 Financial resource strain: Not on file  . Food insecurity:    Worry: Not on file    Inability: Not on file  . Transportation needs:    Medical: Not on file    Non-medical: Not on file  Tobacco Use  . Smoking status: Former Smoker    Packs/day: 1.50    Years: 20.00    Pack years: 30.00    Types: Cigarettes    Last attempt to quit: 08/02/1984    Years since quitting: 32.9  . Smokeless tobacco: Never Used  Substance and Sexual Activity  . Alcohol use: No  . Drug use: No  . Sexual activity: Not Currently    Partners: Male  Lifestyle  .  Physical activity:    Days per week: Not on file    Minutes per session: Not on file  . Stress: Not on file  Relationships  . Social connections:    Talks on phone: Not on file    Gets together: Not on file    Attends religious service: Not on file    Active member of club or organization: Not on file    Attends meetings of clubs or organizations: Not on file    Relationship status: Not on file  . Intimate partner violence:    Fear of current or ex partner: Not on file    Emotionally abused: Not on file    Physically abused: Not on file    Forced sexual activity: Not on file  Other Topics Concern  . Not on file  Social History Narrative   NO REG EXERCISE   Caffeine use: daily      Allergies as of 06/23/2017   No Known Allergies     Medication List        Accurate as of 06/23/17 11:59 PM. Always use your most recent med list.          albuterol (  2.5 MG/3ML) 0.083% nebulizer solution Commonly known as:  PROVENTIL Take 3 mLs (2.5 mg total) by nebulization every 6 (six) hours as needed for wheezing or shortness of breath.   VENTOLIN HFA 108 (90 Base) MCG/ACT inhaler Generic drug:  albuterol Inhale into the lungs.   allopurinol 100 MG tablet Commonly known as:  ZYLOPRIM Take 1 tablet (100 mg total) by mouth daily.   atorvastatin 40 MG tablet Commonly known as:  LIPITOR TAKE 1 TABLET BY MOUTH ONCE DAILY   BASAGLAR KWIKPEN 100 UNIT/ML Sopn Inject 0.4 mLs (40 Units total) into the skin at bedtime.   blood glucose meter kit and supplies Dispense based on patient and insurance preference. Use once day as directed.  Dx Code: E11.51   busPIRone 15 MG tablet Commonly known as:  BUSPAR Take 1 tablet (15 mg total) 3 (three) times daily by mouth.   carvedilol 12.5 MG tablet Commonly known as:  COREG Take 1 tablet (12.5 mg total) by mouth 2 (two) times daily with a meal.   cefUROXime 500 MG tablet Commonly known as:  CEFTIN Take 1 tablet (500 mg total) by mouth 2  (two) times daily with a meal.   cephALEXin 500 MG capsule Commonly known as:  KEFLEX Take 1 capsule (500 mg total) by mouth 4 (four) times daily.   clopidogrel 75 MG tablet Commonly known as:  PLAVIX Take 1 tablet (75 mg total) by mouth daily.   ergocalciferol 50000 units capsule Commonly known as:  VITAMIN D2 Take 50,000 Units by mouth once a week.   fenofibrate 160 MG tablet Take 1 tablet (160 mg total) daily by mouth.   ferrous sulfate 325 (65 FE) MG tablet Take 325 mg by mouth.   Fluticasone-Salmeterol 250-50 MCG/DOSE Aepb Commonly known as:  ADVAIR Inhale 1 puff into the lungs 2 (two) times daily.   glucose blood test strip Use as directed once a day.  DX code: E11.51   insulin aspart 100 UNIT/ML FlexPen Commonly known as:  NOVOLOG FLEXPEN Inject 8-10 units 3x a day before meals   Insulin Syringe-Needle U-100 30G X 1/2" 0.5 ML Misc Commonly known as:  B-D INS SYR ULTRAFINE .5CC/30G Use 3x a day   Lancets Misc Use as directed once a day.  DX code: E11.51   pantoprazole 40 MG tablet Commonly known as:  PROTONIX Take 1 tablet (40 mg total) by mouth daily.   Pen Needles 32G X 4 MM Misc 1 each by Does not apply route daily.   potassium chloride 10 MEQ tablet Commonly known as:  K-DUR,KLOR-CON Take by mouth.   pramipexole 1 MG tablet Commonly known as:  MIRAPEX TAKE 1 TABLET (1 MG TOTAL) BY MOUTH AT BEDTIME.   sertraline 50 MG tablet Commonly known as:  ZOLOFT Take 1.5 tablets (75 mg total) by mouth daily.   torsemide 20 MG tablet Commonly known as:  DEMADEX TAKE FOUR TABLETS BY MOUTH TWICE DAILY   TYLENOL 8 HOUR PO Take 500 mg by mouth.          Objective:   Physical Exam BP 136/74 (BP Location: Left Arm, Patient Position: Sitting, Cuff Size: Normal)   Pulse 75   Temp 97.9 F (36.6 C) (Oral)   Resp 16   Ht '5\' 4"'  (1.626 m)   Wt 232 lb 2 oz (105.3 kg)   SpO2 94%   BMI 39.84 kg/m   General:   Well developed, overweight appearing, no  distress.  Sitting in a wheelchair. HEENT:  Normocephalic .  Face symmetric, atraumatic Lungs:  Rhonchi with cough.  No actual wheezing Normal respiratory effort, no intercostal retractions, no accessory muscle use. Heart: RRR,  no murmur.  No pretibial edema bilaterally  Left foot: The toe is a slightly red and tender to touch distally, no abscess that I can tell.  See picture. Neurologic:  alert & please send, cooperative.. Gait not tested Psych--  No anxious or depressed appearing.          Assessment & Plan:   72 year old female with medical problems that include diabetes, CHF, strokes, HTN, lipidemia, morbid obesity, presents with:  Ingrown toenail: Redness at the left great toe is consistent with a ingrown toenail rather than gout.  Recommend Ceftin for 1 week (disregard kefles RX), soaking in warm water and topical antibiotic.  Quick re-eval if she is not improving COPD: Has mild cough, recommend to continue Advair, use albuterol more liberally, call if not improving soon. Gout : needs a refill on allopurinol  RF Plavix  . Med reconciliation, the   patient's daughter brought the list of medication the patient takes, we did extensive reconciliation.  Today, I spent more than 25   min with the patient and her daughter discussing why my dx is a ingrown toe nail and not gout, doing extensive med rec

## 2017-06-24 ENCOUNTER — Encounter: Payer: Self-pay | Admitting: Internal Medicine

## 2017-06-24 DIAGNOSIS — I69351 Hemiplegia and hemiparesis following cerebral infarction affecting right dominant side: Secondary | ICD-10-CM | POA: Diagnosis not present

## 2017-06-24 DIAGNOSIS — E1122 Type 2 diabetes mellitus with diabetic chronic kidney disease: Secondary | ICD-10-CM | POA: Diagnosis not present

## 2017-06-24 DIAGNOSIS — N183 Chronic kidney disease, stage 3 (moderate): Secondary | ICD-10-CM | POA: Diagnosis not present

## 2017-06-24 DIAGNOSIS — I5033 Acute on chronic diastolic (congestive) heart failure: Secondary | ICD-10-CM | POA: Diagnosis not present

## 2017-06-24 DIAGNOSIS — I69320 Aphasia following cerebral infarction: Secondary | ICD-10-CM | POA: Diagnosis not present

## 2017-06-24 DIAGNOSIS — I130 Hypertensive heart and chronic kidney disease with heart failure and stage 1 through stage 4 chronic kidney disease, or unspecified chronic kidney disease: Secondary | ICD-10-CM | POA: Diagnosis not present

## 2017-06-27 ENCOUNTER — Telehealth: Payer: Self-pay | Admitting: *Deleted

## 2017-06-27 DIAGNOSIS — I69351 Hemiplegia and hemiparesis following cerebral infarction affecting right dominant side: Secondary | ICD-10-CM | POA: Diagnosis not present

## 2017-06-27 DIAGNOSIS — N183 Chronic kidney disease, stage 3 (moderate): Secondary | ICD-10-CM | POA: Diagnosis not present

## 2017-06-27 DIAGNOSIS — I5033 Acute on chronic diastolic (congestive) heart failure: Secondary | ICD-10-CM | POA: Diagnosis not present

## 2017-06-27 DIAGNOSIS — E1122 Type 2 diabetes mellitus with diabetic chronic kidney disease: Secondary | ICD-10-CM | POA: Diagnosis not present

## 2017-06-27 DIAGNOSIS — I130 Hypertensive heart and chronic kidney disease with heart failure and stage 1 through stage 4 chronic kidney disease, or unspecified chronic kidney disease: Secondary | ICD-10-CM | POA: Diagnosis not present

## 2017-06-27 DIAGNOSIS — I69320 Aphasia following cerebral infarction: Secondary | ICD-10-CM | POA: Diagnosis not present

## 2017-06-27 NOTE — Telephone Encounter (Signed)
[facility]; forwarded to provider/SLS 03/25

## 2017-06-28 ENCOUNTER — Telehealth: Payer: Self-pay | Admitting: *Deleted

## 2017-06-28 NOTE — Telephone Encounter (Signed)
Received Home Health Certification and Plan of Care and a Physician Order; forwarded to provider/SLS 03/26

## 2017-06-29 DIAGNOSIS — I5033 Acute on chronic diastolic (congestive) heart failure: Secondary | ICD-10-CM | POA: Diagnosis not present

## 2017-06-29 DIAGNOSIS — E1122 Type 2 diabetes mellitus with diabetic chronic kidney disease: Secondary | ICD-10-CM | POA: Diagnosis not present

## 2017-06-29 DIAGNOSIS — I69320 Aphasia following cerebral infarction: Secondary | ICD-10-CM | POA: Diagnosis not present

## 2017-06-29 DIAGNOSIS — N183 Chronic kidney disease, stage 3 (moderate): Secondary | ICD-10-CM | POA: Diagnosis not present

## 2017-06-29 DIAGNOSIS — I130 Hypertensive heart and chronic kidney disease with heart failure and stage 1 through stage 4 chronic kidney disease, or unspecified chronic kidney disease: Secondary | ICD-10-CM | POA: Diagnosis not present

## 2017-06-29 DIAGNOSIS — I69351 Hemiplegia and hemiparesis following cerebral infarction affecting right dominant side: Secondary | ICD-10-CM | POA: Diagnosis not present

## 2017-06-30 DIAGNOSIS — N183 Chronic kidney disease, stage 3 (moderate): Secondary | ICD-10-CM | POA: Diagnosis not present

## 2017-06-30 DIAGNOSIS — I69320 Aphasia following cerebral infarction: Secondary | ICD-10-CM | POA: Diagnosis not present

## 2017-06-30 DIAGNOSIS — I69351 Hemiplegia and hemiparesis following cerebral infarction affecting right dominant side: Secondary | ICD-10-CM | POA: Diagnosis not present

## 2017-06-30 DIAGNOSIS — I5033 Acute on chronic diastolic (congestive) heart failure: Secondary | ICD-10-CM | POA: Diagnosis not present

## 2017-06-30 DIAGNOSIS — I130 Hypertensive heart and chronic kidney disease with heart failure and stage 1 through stage 4 chronic kidney disease, or unspecified chronic kidney disease: Secondary | ICD-10-CM | POA: Diagnosis not present

## 2017-06-30 DIAGNOSIS — E1122 Type 2 diabetes mellitus with diabetic chronic kidney disease: Secondary | ICD-10-CM | POA: Diagnosis not present

## 2017-07-01 ENCOUNTER — Telehealth: Payer: Self-pay | Admitting: *Deleted

## 2017-07-01 DIAGNOSIS — E1122 Type 2 diabetes mellitus with diabetic chronic kidney disease: Secondary | ICD-10-CM | POA: Diagnosis not present

## 2017-07-01 DIAGNOSIS — I69351 Hemiplegia and hemiparesis following cerebral infarction affecting right dominant side: Secondary | ICD-10-CM | POA: Diagnosis not present

## 2017-07-01 DIAGNOSIS — I130 Hypertensive heart and chronic kidney disease with heart failure and stage 1 through stage 4 chronic kidney disease, or unspecified chronic kidney disease: Secondary | ICD-10-CM | POA: Diagnosis not present

## 2017-07-01 DIAGNOSIS — N183 Chronic kidney disease, stage 3 (moderate): Secondary | ICD-10-CM | POA: Diagnosis not present

## 2017-07-01 DIAGNOSIS — I5033 Acute on chronic diastolic (congestive) heart failure: Secondary | ICD-10-CM | POA: Diagnosis not present

## 2017-07-01 DIAGNOSIS — I69320 Aphasia following cerebral infarction: Secondary | ICD-10-CM | POA: Diagnosis not present

## 2017-07-01 NOTE — Telephone Encounter (Signed)
Received Physician Orders via mail from [facility]; forwarded to provider/SLS 03/29

## 2017-07-04 DIAGNOSIS — I5033 Acute on chronic diastolic (congestive) heart failure: Secondary | ICD-10-CM | POA: Diagnosis not present

## 2017-07-04 DIAGNOSIS — I69351 Hemiplegia and hemiparesis following cerebral infarction affecting right dominant side: Secondary | ICD-10-CM | POA: Diagnosis not present

## 2017-07-04 DIAGNOSIS — E1122 Type 2 diabetes mellitus with diabetic chronic kidney disease: Secondary | ICD-10-CM | POA: Diagnosis not present

## 2017-07-04 DIAGNOSIS — N183 Chronic kidney disease, stage 3 (moderate): Secondary | ICD-10-CM | POA: Diagnosis not present

## 2017-07-04 DIAGNOSIS — I69320 Aphasia following cerebral infarction: Secondary | ICD-10-CM | POA: Diagnosis not present

## 2017-07-04 DIAGNOSIS — I130 Hypertensive heart and chronic kidney disease with heart failure and stage 1 through stage 4 chronic kidney disease, or unspecified chronic kidney disease: Secondary | ICD-10-CM | POA: Diagnosis not present

## 2017-07-05 ENCOUNTER — Other Ambulatory Visit: Payer: Self-pay | Admitting: Cardiology

## 2017-07-05 ENCOUNTER — Encounter: Payer: Self-pay | Admitting: Cardiology

## 2017-07-05 ENCOUNTER — Ambulatory Visit (INDEPENDENT_AMBULATORY_CARE_PROVIDER_SITE_OTHER): Payer: Medicare Other | Admitting: Cardiology

## 2017-07-05 VITALS — BP 131/66 | HR 72 | Ht 64.0 in | Wt 227.0 lb

## 2017-07-05 DIAGNOSIS — E785 Hyperlipidemia, unspecified: Secondary | ICD-10-CM | POA: Diagnosis not present

## 2017-07-05 DIAGNOSIS — I639 Cerebral infarction, unspecified: Secondary | ICD-10-CM | POA: Diagnosis not present

## 2017-07-05 DIAGNOSIS — I10 Essential (primary) hypertension: Secondary | ICD-10-CM | POA: Diagnosis not present

## 2017-07-05 NOTE — Progress Notes (Signed)
Electrophysiology Office Note   Date:  07/05/2017   ID:  Erin [name], DOB 07-26-1945, MRN [id number]  PCP:  Carollee Herter, Alferd Apa, DO  Cardiologist:   Primary Electrophysiologist:  Beverely Suen Meredith Leeds, MD    Chief Complaint  Patient presents with  . Advice Only    Cryptogentic stroke/Discuss loop recorder     History of Present Illness: Erin [name] is a 72 y.o. female who is being seen today for the evaluation of CVA at the request of Erin [name]. Presenting today for electrophysiology evaluation.  She has a history of CHF, CKD, diabetes, hyperlipidemia, hypertension, and CVA.  She has a history of CVA.  She developed new onset aphasia and was taken to [facility].  CT scan showed slight hemorrhagic transformation into a large left MCA stroke.  She did not receive thrombolysis that she was outside.  Carotid ultrasound was unremarkable.  TTE showed an EF of 52% with diastolic dysfunction but no evidence of a PFO or source of embolism.  Telemetry showed sinus rhythm.  She presents today feeling well.  She is able to ambulate with the use of a cane.  She is continuing to have issues with her speech, though this is also been improving.  Today, she denies symptoms of palpitations, chest pain, shortness of breath, orthopnea, PND, lower extremity edema, claudication, dizziness, presyncope, syncope, bleeding, or neurologic sequela. The patient is tolerating medications without difficulties.    Past Medical History:  Diagnosis Date  . Asthma   . CHF (congestive heart failure) [facility])    hospital 11/2013-- [place]  . Chronic kidney disease   . Depression   . Diabetes mellitus ([place])   . GERD (gastroesophageal reflux disease)   . Hyperlipidemia   . Hypertension   . Stroke [facility])    Past Surgical History:  Procedure Laterality Date  . CHOLECYSTECTOMY  2002  . SPINE SURGERY  aug 2015   [place] [facility]  . TONSILLECTOMY    . TUBAL LIGATION       Current  Outpatient Medications  Medication Sig Dispense Refill  . Acetaminophen (TYLENOL 8 HOUR PO) Take 500 mg by mouth.    Marland Kitchen albuterol (PROVENTIL) (2.5 MG/3ML) 0.083% nebulizer solution Take 3 mLs (2.5 mg total) by nebulization every 6 (six) hours as needed for wheezing or shortness of breath. 75 mL 12  . albuterol (VENTOLIN HFA) 108 (90 Base) MCG/ACT inhaler Inhale into the lungs.    Marland Kitchen allopurinol (ZYLOPRIM) 100 MG tablet Take 1 tablet (100 mg total) by mouth daily. 30 tablet 1  . atorvastatin (LIPITOR) 40 MG tablet TAKE 1 TABLET BY MOUTH ONCE DAILY (Patient taking differently: TAKE 1 TABLET BY MOUTH AT BEDTIME) 90 tablet 1  . busPIRone (BUSPAR) 15 MG tablet Take 1 tablet (15 mg total) 3 (three) times daily by mouth. (Patient taking differently: Take 15 mg by mouth 2 (two) times daily. ) 90 tablet 1  . carvedilol (COREG) 12.5 MG tablet Take 1 tablet (12.5 mg total) by mouth 2 (two) times daily with a meal. 60 tablet 0  . clopidogrel (PLAVIX) 75 MG tablet Take 1 tablet (75 mg total) by mouth daily. 30 tablet 1  . ergocalciferol (VITAMIN D2) 50000 UNITS capsule Take 50,000 Units by mouth once a week.    . fenofibrate 160 MG tablet Take 1 tablet (160 mg total) daily by mouth. 90 tablet 2  . ferrous sulfate 325 (65 FE) MG tablet Take 325 mg by mouth.    Marland Kitchen  Fluticasone-Salmeterol (ADVAIR) 250-50 MCG/DOSE AEPB Inhale 1 puff into the lungs 2 (two) times daily.    . insulin aspart (NOVOLOG FLEXPEN) 100 UNIT/ML FlexPen Inject 8-10 units 3x a day before meals 45 mL 3  . Insulin Glargine (BASAGLAR KWIKPEN) 100 UNIT/ML SOPN Inject 0.4 mLs (40 Units total) into the skin at bedtime. 15 pen 3  . pantoprazole (PROTONIX) 40 MG tablet Take 1 tablet (40 mg total) by mouth daily. 90 tablet 1  . pramipexole (MIRAPEX) 1 MG tablet TAKE 1 TABLET (1 MG TOTAL) BY MOUTH AT BEDTIME. 90 tablet 0  . sertraline (ZOLOFT) 50 MG tablet Take 1.5 tablets (75 mg total) by mouth daily. 135 tablet 3  . torsemide (DEMADEX) 20 MG tablet TAKE  FOUR TABLETS BY MOUTH TWICE DAILY     No current facility-administered medications for this visit.     Allergies:   Patient has no known allergies.   Social History:  The patient  reports that she quit smoking about 32 years ago. Her smoking use included cigarettes. She has a 30.00 pack-year smoking history. She has never used smokeless tobacco. She reports that she does not drink alcohol or use drugs.   Family History:  The patient's family history includes Alzheimer's disease in her mother; Arthritis in her brother; Asthma in her father; Cancer (age of onset: 1) in her father; Heart disease in her father, maternal uncle, maternal uncle, and maternal uncle; Heart disease (age of onset: 64) in her sister; Hyperlipidemia in her mother; Hypertension in her father and mother; Pulmonary fibrosis in her sister; Stomach cancer in her paternal grandfather; Stroke in her father and maternal uncle; Uterine cancer in her paternal grandmother.    ROS:  Please see the history of present illness.   Otherwise, review of systems is positive for slurred speech, weakness.   All other systems are reviewed and negative.    PHYSICAL EXAM: VS:  BP 131/66   Pulse 72   Ht 5\' 4"  (1.626 m)   Wt 227 lb (103 kg)   SpO2 (!) 5%   BMI 38.96 kg/m  , BMI Body mass index is 38.96 kg/m. GEN: Well nourished, well developed, in no acute distress  HEENT: normal  Neck: no JVD, carotid bruits, or masses Cardiac: RRR; no murmurs, rubs, or gallops,no edema  Respiratory:  clear to auscultation bilaterally, normal work of breathing GI: soft, nontender, nondistended, + BS MS: no deformity or atrophy  Skin: warm and dry Neuro:  Strength and sensation are intact Psych: euthymic mood, full affect  EKG:  EKG is ordered today. 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, RBBB, LAFB  Recent Labs: 10/14/2016: Hemoglobin 10.4; Platelets 72.0 01/25/2017: ALT 16; BUN 38; Creatinine, Ser 1.17; Potassium 3.5; Sodium 137    Lipid  Panel     Component Value Date/Time   CHOL 126 01/25/2017 1020   TRIG 264.0 (H) 01/25/2017 1020   HDL 33.30 (L) 01/25/2017 1020   CHOLHDL 4 01/25/2017 1020   VLDL 52.8 (H) 01/25/2017 1020   LDLCALC 33 10/18/2016 1005   LDLDIRECT 55.0 01/25/2017 1020     Wt Readings from Last 3 Encounters:  07/05/17 227 lb (103 kg)  06/23/17 232 lb 3.2 oz (105.3 kg)  06/23/17 232 lb 2 oz (105.3 kg)      Other studies Reviewed: Additional studies/ records that were reviewed today include: TTE 04/15/17  Review of the above records today demonstrates:  Injection of agitated saline contrast performed to evaluate for possible  shunt.  The left ventricular size is normal. There is mild concentric left  ventricular hypertrophy.Left ventricular systolic function is normal. LV  ejection fraction = 55-60%. The left ventricular wall motion is normal. The right ventricle is normal in size and function. The left atrium is mildly dilated. Injection of agitated saline showed no right-to-left shunt. There is trivial pericardial effusion. There is no comparison study available. 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  No obvious source of her CVA.  I did discuss with her the possibility of TEE with link monitoring.  Risks and benefits of both were discussed.  Risks include bleeding, infection, and damage to the mouth and throat from the TEE.  The patient understands these risks and is agreed to the procedure.  2.  Hypertension: Currently well controlled.  No changes.  3.  Hyperlipidemia: Continue statin per neurology   Current medicines are reviewed at length with the patient today.   The patient does not have concerns regarding her medicines.  The following changes were made today:  none  Labs/ tests ordered today include:  Orders Placed This Encounter  Procedures  . EKG 12-Lead    Case discussed with neurology  Disposition:   FU with Lavar Rosenzweig ending link results  Signed, Deavin Forst Meredith Leeds, MD  07/05/2017 9:39 AM     [facility] [address] [place] [address] [phone] (office) [phone] (fax)

## 2017-07-05 NOTE — H&P (View-Only) (Signed)
Electrophysiology Office Note   Date:  07/05/2017   ID:  Erin [name], DOB November 22, 1945, MRN [id number]  PCP:  Erin [name], Erin Apa, DO  Cardiologist:   Primary Electrophysiologist:  Erin Meredith Leeds, MD    Chief Complaint  Patient presents with  . Advice Only    Cryptogentic stroke/Discuss loop recorder     History of Present Illness: Erin [name] is a 72 y.o. female who is being seen today for the evaluation of CVA at the request of Erin [name]. Presenting today for electrophysiology evaluation.  She has a history of CHF, CKD, diabetes, hyperlipidemia, hypertension, and CVA.  She has a history of CVA.  She developed new onset aphasia and was taken to [facility].  CT scan showed slight hemorrhagic transformation into a large left MCA stroke.  She did not receive thrombolysis that she was outside.  Carotid ultrasound was unremarkable.  TTE showed an EF of 49% with diastolic dysfunction but no evidence of a PFO or source of embolism.  Telemetry showed sinus rhythm.  She presents today feeling well.  She is able to ambulate with the use of a cane.  She is continuing to have issues with her speech, though this is also been improving.  Today, she denies symptoms of palpitations, chest pain, shortness of breath, orthopnea, PND, lower extremity edema, claudication, dizziness, presyncope, syncope, bleeding, or neurologic sequela. The patient is tolerating medications without difficulties.    Past Medical History:  Diagnosis Date  . Asthma   . CHF (congestive heart failure) [facility])    hospital 11/2013-- [place]  . Chronic kidney disease   . Depression   . Diabetes mellitus ([place])   . GERD (gastroesophageal reflux disease)   . Hyperlipidemia   . Hypertension   . Stroke [facility])    Past Surgical History:  Procedure Laterality Date  . CHOLECYSTECTOMY  2002  . SPINE SURGERY  aug 2015   [place] [facility]  . TONSILLECTOMY    . TUBAL LIGATION       Current  Outpatient Medications  Medication Sig Dispense Refill  . Acetaminophen (TYLENOL 8 HOUR PO) Take 500 mg by mouth.    Marland Kitchen albuterol (PROVENTIL) (2.5 MG/3ML) 0.083% nebulizer solution Take 3 mLs (2.5 mg total) by nebulization every 6 (six) hours as needed for wheezing or shortness of breath. 75 mL 12  . albuterol (VENTOLIN HFA) 108 (90 Base) MCG/ACT inhaler Inhale into the lungs.    Marland Kitchen allopurinol (ZYLOPRIM) 100 MG tablet Take 1 tablet (100 mg total) by mouth daily. 30 tablet 1  . atorvastatin (LIPITOR) 40 MG tablet TAKE 1 TABLET BY MOUTH ONCE DAILY (Patient taking differently: TAKE 1 TABLET BY MOUTH AT BEDTIME) 90 tablet 1  . busPIRone (BUSPAR) 15 MG tablet Take 1 tablet (15 mg total) 3 (three) times daily by mouth. (Patient taking differently: Take 15 mg by mouth 2 (two) times daily. ) 90 tablet 1  . carvedilol (COREG) 12.5 MG tablet Take 1 tablet (12.5 mg total) by mouth 2 (two) times daily with a meal. 60 tablet 0  . clopidogrel (PLAVIX) 75 MG tablet Take 1 tablet (75 mg total) by mouth daily. 30 tablet 1  . ergocalciferol (VITAMIN D2) 50000 UNITS capsule Take 50,000 Units by mouth once a week.    . fenofibrate 160 MG tablet Take 1 tablet (160 mg total) daily by mouth. 90 tablet 2  . ferrous sulfate 325 (65 FE) MG tablet Take 325 mg by mouth.    Marland Kitchen  Fluticasone-Salmeterol (ADVAIR) 250-50 MCG/DOSE AEPB Inhale 1 puff into the lungs 2 (two) times daily.    . insulin aspart (NOVOLOG FLEXPEN) 100 UNIT/ML FlexPen Inject 8-10 units 3x a day before meals 45 mL 3  . Insulin Glargine (BASAGLAR KWIKPEN) 100 UNIT/ML SOPN Inject 0.4 mLs (40 Units total) into the skin at bedtime. 15 pen 3  . pantoprazole (PROTONIX) 40 MG tablet Take 1 tablet (40 mg total) by mouth daily. 90 tablet 1  . pramipexole (MIRAPEX) 1 MG tablet TAKE 1 TABLET (1 MG TOTAL) BY MOUTH AT BEDTIME. 90 tablet 0  . sertraline (ZOLOFT) 50 MG tablet Take 1.5 tablets (75 mg total) by mouth daily. 135 tablet 3  . torsemide (DEMADEX) 20 MG tablet TAKE  FOUR TABLETS BY MOUTH TWICE DAILY     No current facility-administered medications for this visit.     Allergies:   Patient has no known allergies.   Social History:  The patient  reports that she quit smoking about 32 years ago. Her smoking use included cigarettes. She has a 30.00 pack-year smoking history. She has never used smokeless tobacco. She reports that she does not drink alcohol or use drugs.   Family History:  The patient's family history includes Alzheimer's disease in her mother; Arthritis in her brother; Asthma in her father; Cancer (age of onset: 20) in her father; Heart disease in her father, maternal uncle, maternal uncle, and maternal uncle; Heart disease (age of onset: 11) in her sister; Hyperlipidemia in her mother; Hypertension in her father and mother; Pulmonary fibrosis in her sister; Stomach cancer in her paternal grandfather; Stroke in her father and maternal uncle; Uterine cancer in her paternal grandmother.    ROS:  Please see the history of present illness.   Otherwise, review of systems is positive for slurred speech, weakness.   All other systems are reviewed and negative.    PHYSICAL EXAM: VS:  BP 131/66   Pulse 72   Ht 5\' 4"  (1.626 m)   Wt 227 lb (103 kg)   SpO2 (!) 5%   BMI 38.96 kg/m  , BMI Body mass index is 38.96 kg/m. GEN: Well nourished, well developed, in no acute distress  HEENT: normal  Neck: no JVD, carotid bruits, or masses Cardiac: RRR; no murmurs, rubs, or gallops,no edema  Respiratory:  clear to auscultation bilaterally, normal work of breathing GI: soft, nontender, nondistended, + BS MS: no deformity or atrophy  Skin: warm and dry Neuro:  Strength and sensation are intact Psych: euthymic mood, full affect  EKG:  EKG is ordered today. 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, RBBB, LAFB  Recent Labs: 10/14/2016: Hemoglobin 10.4; Platelets 72.0 01/25/2017: ALT 16; BUN 38; Creatinine, Ser 1.17; Potassium 3.5; Sodium 137    Lipid  Panel     Component Value Date/Time   CHOL 126 01/25/2017 1020   TRIG 264.0 (H) 01/25/2017 1020   HDL 33.30 (L) 01/25/2017 1020   CHOLHDL 4 01/25/2017 1020   VLDL 52.8 (H) 01/25/2017 1020   LDLCALC 33 10/18/2016 1005   LDLDIRECT 55.0 01/25/2017 1020     Wt Readings from Last 3 Encounters:  07/05/17 227 lb (103 kg)  06/23/17 232 lb 3.2 oz (105.3 kg)  06/23/17 232 lb 2 oz (105.3 kg)      Other studies Reviewed: Additional studies/ records that were reviewed today include: TTE 04/15/17  Review of the above records today demonstrates:  Injection of agitated saline contrast performed to evaluate for possible  shunt.  The left ventricular size is normal. There is mild concentric left  ventricular hypertrophy.Left ventricular systolic function is normal. LV  ejection fraction = 55-60%. The left ventricular wall motion is normal. The right ventricle is normal in size and function. The left atrium is mildly dilated. Injection of agitated saline showed no right-to-left shunt. There is trivial pericardial effusion. There is no comparison study available. 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  No obvious source of her CVA.  I did discuss with her the possibility of TEE with link monitoring.  Risks and benefits of both were discussed.  Risks include bleeding, infection, and damage to the mouth and throat from the TEE.  The patient understands these risks and is agreed to the procedure.  2.  Hypertension: Currently well controlled.  No changes.  3.  Hyperlipidemia: Continue statin per neurology   Current medicines are reviewed at length with the patient today.   The patient does not have concerns regarding her medicines.  The following changes were made today:  none  Labs/ tests ordered today include:  Orders Placed This Encounter  Procedures  . EKG 12-Lead    Case discussed with neurology  Disposition:   FU with Erin Camnitz ending link results  Signed, Erin Meredith Leeds, MD  07/05/2017 9:39 AM     [facility] [address] [place] [address] [phone] (office) [phone] (fax)

## 2017-07-05 NOTE — Patient Instructions (Addendum)
Medication Instructions:  Your physician recommends that you continue on your current medications as directed. Please refer to the Current Medication list given to you today.  Labwork: None ordered  Testing/Procedures: Your physician has requested that you have a TEE. During a TEE, sound waves are used to create images of your heart. It provides your doctor with information about the size and shape of your heart and how well your heart's chambers and valves are working. In this test, a transducer is attached to the end of a flexible tube that's guided down your throat and into your esophagus (the tube leading from you mouth to your stomach) to get a more detailed image of your heart. You are not awake for the procedure. Please see the instructions below. For further information please visit [email or web address].  You physician has recommended a loop recorder (LINQ) to be implanted. Your LINQ monitor will be implanted after the TEE.  Follow-Up: Your physician recommends that you schedule a follow-up appointment in: 7-10 days, after your procedure on 08/01/17 with divide clinic for a wound check.   * If you need a refill on your cardiac medications before your next appointment, please call your pharmacy.   *Please note that any paperwork needing to be filled out by the provider will need to be addressed at the front desk prior to seeing the provider. Please note that any FMLA, disability or other documents regarding health condition is subject to a $25.00 charge that must be received prior to completion of paperwork in the form of a money order or check.  Thank you for choosing [facility]!!   Trinidad Curet, RN [phone]  Any Other Special Instructions Will Be Listed Below (If Applicable).  TEE INSTRUCTIONS  You are scheduled for a TEE/CARDIOVERSION on 08/01/17 with Dr. Sallyanne Kuster.  Please arrive at the [facility] Entrance "[facility]" of [facility] at 8:30 AM the day of your  procedure. (the address is: [address])  1.) Diet:  Nothing to eat or drink after midnight except your medications with a sip of water. (see medication instructions below)  2.) Labs:  Your lab work will be done at the hospital prior to your procedure  3.)   Medication Instructions: Take 1/2 your usual dose of bedtime insulin the night before your procedure Do not take any insulin the morning of your procedure.  All your remaining         medications may be taken with a sip of water..  4.) You must have a responsible person to drive you home and stay in the waiting  area during your procedure. Failure to do so could result in cancellation.  5.) Bring your current insurance cards and current list of all your medications.  *Special Note:  Every effort is made to have your procedure done on time.  Occasionally there are emergencies that present themselves at the hospital that may cause delays.  Please be patient if a delay does occur.  *If you have any questions after you get home, please call the office at [phone].

## 2017-07-06 ENCOUNTER — Encounter: Payer: Self-pay | Admitting: Sports Medicine

## 2017-07-06 ENCOUNTER — Ambulatory Visit (INDEPENDENT_AMBULATORY_CARE_PROVIDER_SITE_OTHER): Payer: Medicare Other | Admitting: Sports Medicine

## 2017-07-06 DIAGNOSIS — M79675 Pain in left toe(s): Secondary | ICD-10-CM

## 2017-07-06 DIAGNOSIS — IMO0002 Reserved for concepts with insufficient information to code with codable children: Secondary | ICD-10-CM

## 2017-07-06 DIAGNOSIS — I639 Cerebral infarction, unspecified: Secondary | ICD-10-CM

## 2017-07-06 DIAGNOSIS — E1165 Type 2 diabetes mellitus with hyperglycemia: Secondary | ICD-10-CM | POA: Diagnosis not present

## 2017-07-06 DIAGNOSIS — I872 Venous insufficiency (chronic) (peripheral): Secondary | ICD-10-CM

## 2017-07-06 DIAGNOSIS — N183 Chronic kidney disease, stage 3 (moderate): Secondary | ICD-10-CM | POA: Diagnosis not present

## 2017-07-06 DIAGNOSIS — I69351 Hemiplegia and hemiparesis following cerebral infarction affecting right dominant side: Secondary | ICD-10-CM | POA: Diagnosis not present

## 2017-07-06 DIAGNOSIS — B351 Tinea unguium: Secondary | ICD-10-CM | POA: Diagnosis not present

## 2017-07-06 DIAGNOSIS — E1151 Type 2 diabetes mellitus with diabetic peripheral angiopathy without gangrene: Secondary | ICD-10-CM

## 2017-07-06 DIAGNOSIS — L600 Ingrowing nail: Secondary | ICD-10-CM

## 2017-07-06 DIAGNOSIS — I5033 Acute on chronic diastolic (congestive) heart failure: Secondary | ICD-10-CM | POA: Diagnosis not present

## 2017-07-06 DIAGNOSIS — E1122 Type 2 diabetes mellitus with diabetic chronic kidney disease: Secondary | ICD-10-CM | POA: Diagnosis not present

## 2017-07-06 DIAGNOSIS — M79674 Pain in right toe(s): Secondary | ICD-10-CM | POA: Diagnosis not present

## 2017-07-06 DIAGNOSIS — I69320 Aphasia following cerebral infarction: Secondary | ICD-10-CM | POA: Diagnosis not present

## 2017-07-06 DIAGNOSIS — I130 Hypertensive heart and chronic kidney disease with heart failure and stage 1 through stage 4 chronic kidney disease, or unspecified chronic kidney disease: Secondary | ICD-10-CM | POA: Diagnosis not present

## 2017-07-06 NOTE — Progress Notes (Signed)
Subjective: Erin [name] is a 72 y.o. female patient with history of diabetes who presents to office today complaining of long,mildly painful nails  while ambulating in shoes; unable to trim. Patient is assisted by daughter-in-law who admits that the left great toenail has been tender over the last 2 weeks saw primary care doctor who thought it was a ingrown toenail and recommended Epsom salts soaks and they have been applying Neosporin they sent the soaks the Neosporin has helped a little better however want to have it checked because she is diabetic.  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  Patient's daughter in-law states that the glucose reading this morning was 187 mg/dl. Patient denies any new changes in medication or new problems.   Review of Systems  Unable to perform ROS: Dementia     Patient Active Problem List   Diagnosis Date Noted  . Chronic venous insufficiency 12/20/2016  . Varicose veins of bilateral lower extremities with pain 12/20/2016  . Lymphedema 12/20/2016  . DM (diabetes mellitus) type II uncontrolled, periph vascular disorder ([place]) 12/31/2014  . Edema 06/09/2014  . History of bacterial pneumonia 05/24/2014  . OSA (obstructive sleep apnea) 04/18/2014  . CAP (community acquired pneumonia) 03/15/2014  . Viral URI with cough 02/18/2014  . CHF (congestive heart failure) ([place]) 11/29/2013  . Acute renal failure ([place]) 11/29/2013  . Preop examination 10/25/2013  . Severe obesity (BMI >= 40) ([place]) 01/17/2013  . Back pain with radiation 05/17/2011  . Urinary frequency 05/17/2011  . COPD (chronic obstructive pulmonary disease) ([place]) 02/23/2011  . DOE (dyspnea on exertion) 02/22/2011  . UNSPECIFIED VITAMIN D DEFICIENCY 06/18/2010  . KNEE PAIN, RIGHT 04/07/2010  . SINUSITIS - ACUTE-NOS 03/17/2009  . RESTLESS LEG SYNDROME, MILD 01/06/2009  . Essential  hypertension 12/02/2008  . MAMMOGRAM, ABNORMAL, RIGHT 04/08/2008  . POSTMENOPAUSAL STATUS 10/16/2007  . Hyperlipidemia 11/08/2006  . THROMBOCYTOPENIA NOS 11/08/2006  . Anxiety 11/08/2006  . Depression, major, single episode, mild ([place]) 11/08/2006   Current Outpatient Medications on File Prior to Visit  Medication Sig Dispense Refill  . Acetaminophen (TYLENOL 8 HOUR PO) Take 500 mg by mouth.    Marland Kitchen albuterol (PROVENTIL) (2.5 MG/3ML) 0.083% nebulizer solution Take 3 mLs (2.5 mg total) by nebulization every 6 (six) hours as needed for wheezing or shortness of breath. 75 mL 12  . albuterol (VENTOLIN HFA) 108 (90 Base) MCG/ACT inhaler Inhale into the lungs.    Marland Kitchen allopurinol (ZYLOPRIM) 100 MG tablet Take 1 tablet (100 mg total) by mouth daily. 30 tablet 1  . atorvastatin (LIPITOR) 40 MG tablet TAKE 1 TABLET BY MOUTH ONCE DAILY (Patient taking differently: TAKE 1 TABLET BY MOUTH AT BEDTIME) 90 tablet 1  . busPIRone (BUSPAR) 15 MG tablet Take 1 tablet (15 mg total) 3 (three) times daily by mouth. (Patient taking differently: Take 15 mg by mouth 2 (two) times daily. ) 90 tablet 1  . carvedilol (COREG) 12.5 MG tablet Take 1 tablet (12.5 mg total) by mouth 2 (two) times daily with a meal. 60 tablet 0  . clopidogrel (PLAVIX) 75 MG tablet Take 1 tablet (75 mg total) by mouth daily. 30 tablet 1  . ergocalciferol (VITAMIN D2) 50000 UNITS capsule Take 50,000 Units by mouth once a week.    . fenofibrate 160 MG tablet Take 1 tablet (160 mg total) daily by mouth. 90 tablet 2  . ferrous sulfate 325 (65 FE)  MG tablet Take 325 mg by mouth.    . Fluticasone-Salmeterol (ADVAIR) 250-50 MCG/DOSE AEPB Inhale 1 puff into the lungs 2 (two) times daily.    . insulin aspart (NOVOLOG FLEXPEN) 100 UNIT/ML FlexPen Inject 8-10 units 3x a day before meals 45 mL 3  . Insulin Glargine (BASAGLAR KWIKPEN) 100 UNIT/ML SOPN Inject 0.4 mLs (40 Units total) into the skin at bedtime. 15 pen 3  . pantoprazole (PROTONIX) 40 MG tablet Take 1  tablet (40 mg total) by mouth daily. 90 tablet 1  . pramipexole (MIRAPEX) 1 MG tablet TAKE 1 TABLET (1 MG TOTAL) BY MOUTH AT BEDTIME. 90 tablet 0  . sertraline (ZOLOFT) 50 MG tablet Take 1.5 tablets (75 mg total) by mouth daily. 135 tablet 3  . torsemide (DEMADEX) 20 MG tablet TAKE FOUR TABLETS BY MOUTH TWICE DAILY     No current facility-administered medications on file prior to visit.    No Known Allergies  No results found for this or any previous visit (from the past 2160 hour(s)).  Objective: General: Patient is awake, alert, and oriented x 2 and in no acute distress.  Integument: Skin is dry and supple bilateral. Nails are tender, long, thickened and  dystrophic with subungual debris, consistent with onychomycosis, 1-5 bilateral.  At the left great toenail medial border there is mild swelling with blanchable erythema and mild tenderness to touch, no warmth, no active drainage, no other signs of infection.  Minimal callus sub-met 5 bilateral. Remaining integument unremarkable.  Vasculature:  Dorsalis Pedis pulse 1/4 bilateral. Posterior Tibial pulse  0/4 bilateral.  Capillary fill time <5 sec 1-5 bilateral.  No hair growth to the level of the digits. Temperature gradient mildly decreased.  Significant varicosities and hyperpigmentation present bilateral.  Chronic edema present bilateral.   Neurology:Difficult to discern due to patient status.  No Babinski sign present bilateral.   Musculoskeletal: No symptomatic pedal deformities noted bilateral. Muscular strength 5/5 in all lower extremity muscular groups bilateral without pain on range of motion . No tenderness with calf compression bilateral.  Assessment and Plan: Problem List Items Addressed This Visit      Cardiovascular and Mediastinum   DM (diabetes mellitus) type II uncontrolled, periph vascular disorder ([place])   Chronic venous insufficiency    Other Visit Diagnoses    Pain due to onychomycosis of toenails of both feet     -  Primary   Ingrowing nail          -Examined patient. -Discussed and educated patient on diabetic foot care, especially with  regards to the vascular, neurological and musculoskeletal systems.  -Stressed the importance of good glycemic control and the detriment of not  controlling glucose levels in relation to the foot. -Mechanically debrided all nails 1-5 bilateral using sterile nail nipper and trimmed offending left great toenail medial border to patient's tolerance and filed with dremel without incident  -Recommend to continue soaking with Epsom salt and apply antibiotic cream to left great toe times 1 week -Patient at this time is not a good candidate for a more aggressive nail avulsion procedure due to diabetes and poor vascular status -Answered all patient' daughter-in-laws questions -Patient to return in 2 weeks for a nail check if nail is better will reschedule patient to come back for routine diabetic foot care however if nail seems to be worsening or not improved then will have to get a letter of medical clearance and do ABI testing on patient to see if patient can withstand undergoing  a nail procedure -Patient's daughter-in-law advised to call the office if any problems or questions arise in the meantime.  Landis Martins, DPM

## 2017-07-06 NOTE — Patient Instructions (Addendum)

## 2017-07-08 DIAGNOSIS — I5033 Acute on chronic diastolic (congestive) heart failure: Secondary | ICD-10-CM | POA: Diagnosis not present

## 2017-07-08 DIAGNOSIS — N183 Chronic kidney disease, stage 3 (moderate): Secondary | ICD-10-CM | POA: Diagnosis not present

## 2017-07-08 DIAGNOSIS — I69351 Hemiplegia and hemiparesis following cerebral infarction affecting right dominant side: Secondary | ICD-10-CM | POA: Diagnosis not present

## 2017-07-08 DIAGNOSIS — E1122 Type 2 diabetes mellitus with diabetic chronic kidney disease: Secondary | ICD-10-CM | POA: Diagnosis not present

## 2017-07-08 DIAGNOSIS — I69320 Aphasia following cerebral infarction: Secondary | ICD-10-CM | POA: Diagnosis not present

## 2017-07-08 DIAGNOSIS — I130 Hypertensive heart and chronic kidney disease with heart failure and stage 1 through stage 4 chronic kidney disease, or unspecified chronic kidney disease: Secondary | ICD-10-CM | POA: Diagnosis not present

## 2017-07-11 ENCOUNTER — Telehealth: Payer: Self-pay | Admitting: *Deleted

## 2017-07-11 DIAGNOSIS — N183 Chronic kidney disease, stage 3 (moderate): Secondary | ICD-10-CM | POA: Diagnosis not present

## 2017-07-11 DIAGNOSIS — I69351 Hemiplegia and hemiparesis following cerebral infarction affecting right dominant side: Secondary | ICD-10-CM | POA: Diagnosis not present

## 2017-07-11 DIAGNOSIS — I69320 Aphasia following cerebral infarction: Secondary | ICD-10-CM | POA: Diagnosis not present

## 2017-07-11 DIAGNOSIS — E1122 Type 2 diabetes mellitus with diabetic chronic kidney disease: Secondary | ICD-10-CM | POA: Diagnosis not present

## 2017-07-11 DIAGNOSIS — I130 Hypertensive heart and chronic kidney disease with heart failure and stage 1 through stage 4 chronic kidney disease, or unspecified chronic kidney disease: Secondary | ICD-10-CM | POA: Diagnosis not present

## 2017-07-11 DIAGNOSIS — I5033 Acute on chronic diastolic (congestive) heart failure: Secondary | ICD-10-CM | POA: Diagnosis not present

## 2017-07-11 NOTE — Telephone Encounter (Signed)
Received Physician Orders from [place] of [facility]; forwarded to provider/SLS 04/08

## 2017-07-12 ENCOUNTER — Telehealth: Payer: Self-pay | Admitting: *Deleted

## 2017-07-12 ENCOUNTER — Encounter: Payer: Self-pay | Admitting: Family Medicine

## 2017-07-12 ENCOUNTER — Ambulatory Visit (INDEPENDENT_AMBULATORY_CARE_PROVIDER_SITE_OTHER): Payer: Medicare Other | Admitting: Family Medicine

## 2017-07-12 VITALS — BP 122/56 | HR 73 | Temp 98.0°F | Resp 16 | Ht 64.0 in | Wt 227.2 lb

## 2017-07-12 DIAGNOSIS — E1165 Type 2 diabetes mellitus with hyperglycemia: Secondary | ICD-10-CM | POA: Diagnosis not present

## 2017-07-12 DIAGNOSIS — I639 Cerebral infarction, unspecified: Secondary | ICD-10-CM

## 2017-07-12 DIAGNOSIS — E1151 Type 2 diabetes mellitus with diabetic peripheral angiopathy without gangrene: Secondary | ICD-10-CM | POA: Diagnosis not present

## 2017-07-12 DIAGNOSIS — E785 Hyperlipidemia, unspecified: Secondary | ICD-10-CM

## 2017-07-12 DIAGNOSIS — E1169 Type 2 diabetes mellitus with other specified complication: Secondary | ICD-10-CM | POA: Diagnosis not present

## 2017-07-12 DIAGNOSIS — E1122 Type 2 diabetes mellitus with diabetic chronic kidney disease: Secondary | ICD-10-CM | POA: Diagnosis not present

## 2017-07-12 DIAGNOSIS — Z8673 Personal history of transient ischemic attack (TIA), and cerebral infarction without residual deficits: Secondary | ICD-10-CM

## 2017-07-12 DIAGNOSIS — I10 Essential (primary) hypertension: Secondary | ICD-10-CM

## 2017-07-12 DIAGNOSIS — IMO0002 Reserved for concepts with insufficient information to code with codable children: Secondary | ICD-10-CM

## 2017-07-12 LAB — LIPID PANEL
Cholesterol: 125 mg/dL (ref 0–200)
HDL: 38 mg/dL — ABNORMAL LOW (ref >39.00)
NonHDL: 87.41
Total CHOL/HDL Ratio: 3
Triglycerides: 257 mg/dL — ABNORMAL HIGH (ref 0.0–149.0)
VLDL: 51.4 mg/dL — ABNORMAL HIGH (ref 0.0–40.0)

## 2017-07-12 LAB — COMPREHENSIVE METABOLIC PANEL
ALT: 26 U/L (ref 0–35)
AST: 39 U/L — ABNORMAL HIGH (ref 0–37)
Albumin: 4.2 g/dL (ref 3.5–5.2)
Alkaline Phosphatase: 66 U/L (ref 39–117)
BUN: 39 mg/dL — ABNORMAL HIGH (ref 6–23)
CO2: 28 mEq/L (ref 19–32)
Calcium: 9.6 mg/dL (ref 8.4–10.5)
Chloride: 101 mEq/L (ref 96–112)
Creatinine, Ser: 1.35 mg/dL — ABNORMAL HIGH (ref 0.40–1.20)
GFR: 40.94 mL/min — ABNORMAL LOW (ref >60.00)
Glucose, Bld: 101 mg/dL — ABNORMAL HIGH (ref 70–99)
Potassium: 3.6 mEq/L (ref 3.5–5.1)
Sodium: 139 mEq/L (ref 135–145)
Total Bilirubin: 0.7 mg/dL (ref 0.2–1.2)
Total Protein: 7.6 g/dL (ref 6.0–8.3)

## 2017-07-12 LAB — HEMOGLOBIN A1C: Hgb A1c MFr Bld: 7.2 % — ABNORMAL HIGH (ref 4.6–6.5)

## 2017-07-12 LAB — HEPATIC FUNCTION PANEL
ALT: 26 U/L (ref 0–35)
AST: 39 U/L — ABNORMAL HIGH (ref 0–37)
Albumin: 4.2 g/dL (ref 3.5–5.2)
Alkaline Phosphatase: 66 U/L (ref 39–117)
Bilirubin, Direct: 0.2 mg/dL (ref 0.0–0.3)
Total Bilirubin: 0.7 mg/dL (ref 0.2–1.2)
Total Protein: 7.6 g/dL (ref 6.0–8.3)

## 2017-07-12 LAB — LDL CHOLESTEROL, DIRECT: Direct LDL: 54 mg/dL

## 2017-07-12 NOTE — Telephone Encounter (Signed)
Received Physician Orders from [place] of [facility] via mail; forwarded to provider/SLS 04/09

## 2017-07-12 NOTE — Patient Instructions (Signed)

## 2017-07-13 DIAGNOSIS — N183 Chronic kidney disease, stage 3 (moderate): Secondary | ICD-10-CM | POA: Diagnosis not present

## 2017-07-13 DIAGNOSIS — I5033 Acute on chronic diastolic (congestive) heart failure: Secondary | ICD-10-CM | POA: Diagnosis not present

## 2017-07-13 DIAGNOSIS — I69351 Hemiplegia and hemiparesis following cerebral infarction affecting right dominant side: Secondary | ICD-10-CM | POA: Diagnosis not present

## 2017-07-13 DIAGNOSIS — I130 Hypertensive heart and chronic kidney disease with heart failure and stage 1 through stage 4 chronic kidney disease, or unspecified chronic kidney disease: Secondary | ICD-10-CM | POA: Diagnosis not present

## 2017-07-13 DIAGNOSIS — I69320 Aphasia following cerebral infarction: Secondary | ICD-10-CM | POA: Diagnosis not present

## 2017-07-13 DIAGNOSIS — E1122 Type 2 diabetes mellitus with diabetic chronic kidney disease: Secondary | ICD-10-CM | POA: Diagnosis not present

## 2017-07-13 NOTE — Progress Notes (Signed)
Patient ID: Erin [name], female    DOB: 1945/05/04  Age: 72 y.o. MRN: [id number]    Subjective:  Subjective  HPI [name] presents for f/u stroke , dm and bp.   She is living with her son.  Her daughter is with her today.  They are cooking for her and she is losing weight.  Her sugars have been good per home readings per daughter. They have a f/u with endo in a few weeks.    Review of Systems  Constitutional: Negative for activity change, appetite change, fatigue and unexpected weight change.  Respiratory: Negative for cough and shortness of breath.   Cardiovascular: Negative for chest pain and palpitations.  Neurological: Positive for speech difficulty and weakness.  Psychiatric/Behavioral: Negative for behavioral problems and dysphoric mood. The patient is not nervous/anxious.     History Past Medical History:  Diagnosis Date  . Asthma   . CHF (congestive heart failure) [facility])    hospital 11/2013-- [place]  . Chronic kidney disease   . Depression   . Diabetes mellitus ([place])   . GERD (gastroesophageal reflux disease)   . Hyperlipidemia   . Hypertension   . Stroke [facility])     She has a past surgical history that includes Cholecystectomy (2002); Tubal ligation; Tonsillectomy; and Spine surgery (aug 2015).   Her family history includes Alzheimer's disease in her mother; Arthritis in her brother; Asthma in her father; Cancer (age of onset: 10) in her father; Heart disease in her father, maternal uncle, maternal uncle, and maternal uncle; Heart disease (age of onset: 44) in her sister; Hyperlipidemia in her mother; Hypertension in her father and mother; Pulmonary fibrosis in her sister; Stomach cancer in her paternal grandfather; Stroke in her father and maternal uncle; Uterine cancer in her paternal grandmother.She reports that she quit smoking about 32 years ago. Her smoking use included cigarettes. She has a 30.00 pack-year smoking history. She has never used smokeless  tobacco. She reports that she does not drink alcohol or use drugs.  Current Outpatient Medications on File Prior to Visit  Medication Sig Dispense Refill  . Acetaminophen (TYLENOL 8 HOUR PO) Take 500 mg by mouth.    Marland Kitchen albuterol (PROVENTIL) (2.5 MG/3ML) 0.083% nebulizer solution Take 3 mLs (2.5 mg total) by nebulization every 6 (six) hours as needed for wheezing or shortness of breath. 75 mL 12  . albuterol (VENTOLIN HFA) 108 (90 Base) MCG/ACT inhaler Inhale into the lungs.    Marland Kitchen allopurinol (ZYLOPRIM) 100 MG tablet Take 1 tablet (100 mg total) by mouth daily. 30 tablet 1  . atorvastatin (LIPITOR) 40 MG tablet TAKE 1 TABLET BY MOUTH ONCE DAILY (Patient taking differently: TAKE 1 TABLET BY MOUTH AT BEDTIME) 90 tablet 1  . busPIRone (BUSPAR) 15 MG tablet Take 1 tablet (15 mg total) 3 (three) times daily by mouth. (Patient taking differently: Take 15 mg by mouth 2 (two) times daily. ) 90 tablet 1  . carvedilol (COREG) 12.5 MG tablet Take 1 tablet (12.5 mg total) by mouth 2 (two) times daily with a meal. 60 tablet 0  . clopidogrel (PLAVIX) 75 MG tablet Take 1 tablet (75 mg total) by mouth daily. 30 tablet 1  . ergocalciferol (VITAMIN D2) 50000 UNITS capsule Take 50,000 Units by mouth once a week.    . fenofibrate 160 MG tablet Take 1 tablet (160 mg total) daily by mouth. 90 tablet 2  . ferrous sulfate 325 (65 FE) MG tablet Take 325 mg by mouth.    Marland Kitchen  Fluticasone-Salmeterol (ADVAIR) 250-50 MCG/DOSE AEPB Inhale 1 puff into the lungs 2 (two) times daily.    . insulin aspart (NOVOLOG FLEXPEN) 100 UNIT/ML FlexPen Inject 8-10 units 3x a day before meals 45 mL 3  . Insulin Glargine (BASAGLAR KWIKPEN) 100 UNIT/ML SOPN Inject 0.4 mLs (40 Units total) into the skin at bedtime. 15 pen 3  . pantoprazole (PROTONIX) 40 MG tablet Take 1 tablet (40 mg total) by mouth daily. 90 tablet 1  . pramipexole (MIRAPEX) 1 MG tablet TAKE 1 TABLET (1 MG TOTAL) BY MOUTH AT BEDTIME. 90 tablet 0  . sertraline (ZOLOFT) 50 MG tablet  Take 1.5 tablets (75 mg total) by mouth daily. 135 tablet 3  . torsemide (DEMADEX) 20 MG tablet TAKE FOUR TABLETS BY MOUTH TWICE DAILY     No current facility-administered medications on file prior to visit.      Objective:  Objective  Physical Exam  Constitutional: She appears well-developed and well-nourished. She appears lethargic.  HENT:  Head: Normocephalic and atraumatic.  Eyes: Conjunctivae and EOM are normal.  Neck: Normal range of motion. Neck supple. No JVD present. Carotid bruit is not present. No thyromegaly present.  Cardiovascular: Normal rate, regular rhythm and normal heart sounds.  No murmur heard. Pulmonary/Chest: Effort normal and breath sounds normal. No respiratory distress. She has no wheezes. She has no rales. She exhibits no tenderness.  Musculoskeletal: She exhibits no edema.  Neurological: She has normal reflexes. She appears lethargic.  Slight weakness R leg and wide based gait-- walking with cane Speech--- speaking in sentences / mumbling, difficult to understand   Psychiatric: She has a normal mood and affect.   BP (!) 122/56 (BP Location: Left Arm, Cuff Size: Large)   Pulse 73   Temp 98 F (36.7 C) (Oral)   Resp 16   Ht 5\' 4"  (1.626 m)   Wt 227 lb 3.2 oz (103.1 kg)   SpO2 95%   BMI 39.00 kg/m  Wt Readings from Last 3 Encounters:  07/12/17 227 lb 3.2 oz (103.1 kg)  07/05/17 227 lb (103 kg)  06/23/17 232 lb 3.2 oz (105.3 kg)     Lab Results  Component Value Date   WBC 16.2 (H) 10/14/2016   HGB 10.4 (L) 10/14/2016   HCT 32.0 (L) 10/14/2016   PLT 72.0 (L) 10/14/2016   GLUCOSE 101 (H) 07/12/2017   CHOL 125 07/12/2017   TRIG 257.0 (H) 07/12/2017   HDL 38.00 (L) 07/12/2017   LDLDIRECT 54.0 07/12/2017   LDLCALC 33 10/18/2016   ALT 26 07/12/2017   ALT 26 07/12/2017   AST 39 (H) 07/12/2017   AST 39 (H) 07/12/2017   NA 139 07/12/2017   K 3.6 07/12/2017   CL 101 07/12/2017   CREATININE 1.35 (H) 07/12/2017   BUN 39 (H) 07/12/2017   CO2 28  07/12/2017   TSH 2.29 05/17/2011   HGBA1C 7.2 (H) 07/12/2017   MICROALBUR 2.7 (H) 01/06/2016    No results found.   Assessment & Plan:  Plan  I am having Oliviarose B. [name] maintain her ergocalciferol, albuterol, Fluticasone-Salmeterol, sertraline, torsemide, atorvastatin, fenofibrate, busPIRone, pantoprazole, ferrous sulfate, albuterol, Acetaminophen (TYLENOL 8 HOUR PO), pramipexole, carvedilol, allopurinol, clopidogrel, insulin aspart, and BASAGLAR KWIKPEN.  No orders of the defined types were placed in this encounter.   Problem List Items Addressed This Visit      Unprioritized   Cryptogenic stroke [facility])    Per neuro      Relevant Orders   Lipid panel (Completed)  Hemoglobin A1c (Completed)   Comprehensive metabolic panel (Completed)   Hepatic function panel (Completed)   DM (diabetes mellitus) type II uncontrolled, periph vascular disorder ([facility])    F/u endo Check labs and con't meds      Essential hypertension    Well controlled, no changes to meds. Encouraged heart healthy diet such as the DASH diet and exercise as tolerated.       Relevant Orders   Lipid panel (Completed)   Hemoglobin A1c (Completed)   Comprehensive metabolic panel (Completed)   Hepatic function panel (Completed)    Other Visit Diagnoses    Controlled type 2 diabetes mellitus with chronic kidney disease, unspecified CKD stage, unspecified whether long term insulin use ([place])    -  Primary   Relevant Orders   Lipid panel (Completed)   Hemoglobin A1c (Completed)   Comprehensive metabolic panel (Completed)   Hepatic function panel (Completed)   Hyperlipidemia associated with type 2 diabetes mellitus ([place])       Relevant Orders   Lipid panel (Completed)   Hemoglobin A1c (Completed)   Comprehensive metabolic panel (Completed)   Hepatic function panel (Completed)   LDL cholesterol, direct (Completed)      Follow-up: Return in about 3 months (around 10/11/2017).  Ann Held, DO

## 2017-07-14 DIAGNOSIS — I639 Cerebral infarction, unspecified: Secondary | ICD-10-CM | POA: Insufficient documentation

## 2017-07-14 NOTE — Assessment & Plan Note (Signed)
F/u endo Check labs and con't meds

## 2017-07-14 NOTE — Assessment & Plan Note (Signed)
Well controlled, no changes to meds. Encouraged heart healthy diet such as the DASH diet and exercise as tolerated.  °

## 2017-07-14 NOTE — Assessment & Plan Note (Signed)
Per neuro 

## 2017-07-15 DIAGNOSIS — I5033 Acute on chronic diastolic (congestive) heart failure: Secondary | ICD-10-CM | POA: Diagnosis not present

## 2017-07-15 DIAGNOSIS — I69351 Hemiplegia and hemiparesis following cerebral infarction affecting right dominant side: Secondary | ICD-10-CM | POA: Diagnosis not present

## 2017-07-15 DIAGNOSIS — E1122 Type 2 diabetes mellitus with diabetic chronic kidney disease: Secondary | ICD-10-CM | POA: Diagnosis not present

## 2017-07-15 DIAGNOSIS — N183 Chronic kidney disease, stage 3 (moderate): Secondary | ICD-10-CM | POA: Diagnosis not present

## 2017-07-15 DIAGNOSIS — I69320 Aphasia following cerebral infarction: Secondary | ICD-10-CM | POA: Diagnosis not present

## 2017-07-15 DIAGNOSIS — I130 Hypertensive heart and chronic kidney disease with heart failure and stage 1 through stage 4 chronic kidney disease, or unspecified chronic kidney disease: Secondary | ICD-10-CM | POA: Diagnosis not present

## 2017-07-18 DIAGNOSIS — I69320 Aphasia following cerebral infarction: Secondary | ICD-10-CM | POA: Diagnosis not present

## 2017-07-18 DIAGNOSIS — I69351 Hemiplegia and hemiparesis following cerebral infarction affecting right dominant side: Secondary | ICD-10-CM | POA: Diagnosis not present

## 2017-07-18 DIAGNOSIS — I130 Hypertensive heart and chronic kidney disease with heart failure and stage 1 through stage 4 chronic kidney disease, or unspecified chronic kidney disease: Secondary | ICD-10-CM | POA: Diagnosis not present

## 2017-07-18 DIAGNOSIS — I5033 Acute on chronic diastolic (congestive) heart failure: Secondary | ICD-10-CM | POA: Diagnosis not present

## 2017-07-18 DIAGNOSIS — E1122 Type 2 diabetes mellitus with diabetic chronic kidney disease: Secondary | ICD-10-CM | POA: Diagnosis not present

## 2017-07-18 DIAGNOSIS — N183 Chronic kidney disease, stage 3 (moderate): Secondary | ICD-10-CM | POA: Diagnosis not present

## 2017-07-19 ENCOUNTER — Other Ambulatory Visit: Payer: Self-pay | Admitting: Family Medicine

## 2017-07-20 ENCOUNTER — Ambulatory Visit (INDEPENDENT_AMBULATORY_CARE_PROVIDER_SITE_OTHER): Payer: Medicare Other | Admitting: Sports Medicine

## 2017-07-20 ENCOUNTER — Encounter: Payer: Self-pay | Admitting: Sports Medicine

## 2017-07-20 DIAGNOSIS — E1151 Type 2 diabetes mellitus with diabetic peripheral angiopathy without gangrene: Secondary | ICD-10-CM | POA: Diagnosis not present

## 2017-07-20 DIAGNOSIS — I69320 Aphasia following cerebral infarction: Secondary | ICD-10-CM | POA: Diagnosis not present

## 2017-07-20 DIAGNOSIS — I69351 Hemiplegia and hemiparesis following cerebral infarction affecting right dominant side: Secondary | ICD-10-CM | POA: Diagnosis not present

## 2017-07-20 DIAGNOSIS — E1122 Type 2 diabetes mellitus with diabetic chronic kidney disease: Secondary | ICD-10-CM | POA: Diagnosis not present

## 2017-07-20 DIAGNOSIS — IMO0002 Reserved for concepts with insufficient information to code with codable children: Secondary | ICD-10-CM

## 2017-07-20 DIAGNOSIS — I639 Cerebral infarction, unspecified: Secondary | ICD-10-CM

## 2017-07-20 DIAGNOSIS — E1165 Type 2 diabetes mellitus with hyperglycemia: Secondary | ICD-10-CM | POA: Diagnosis not present

## 2017-07-20 DIAGNOSIS — I5033 Acute on chronic diastolic (congestive) heart failure: Secondary | ICD-10-CM | POA: Diagnosis not present

## 2017-07-20 DIAGNOSIS — N183 Chronic kidney disease, stage 3 (moderate): Secondary | ICD-10-CM | POA: Diagnosis not present

## 2017-07-20 DIAGNOSIS — I130 Hypertensive heart and chronic kidney disease with heart failure and stage 1 through stage 4 chronic kidney disease, or unspecified chronic kidney disease: Secondary | ICD-10-CM | POA: Diagnosis not present

## 2017-07-20 DIAGNOSIS — M79675 Pain in left toe(s): Secondary | ICD-10-CM

## 2017-07-20 DIAGNOSIS — L600 Ingrowing nail: Secondary | ICD-10-CM

## 2017-07-20 NOTE — Progress Notes (Signed)
Subjective: Erin [name] is a 72 y.o. female patient with history of diabetes who returns to office today for check of left great toe.  Patient's daughter in-law states that the swelling and redness and drainage is resolved has been treating the area with Neosporin and Epsom salts as recommended states that the pain is better just a little tender from time to time.  Denies nausea, vomiting, fever, chills, no other acute issues noted.   Patient Active Problem List   Diagnosis Date Noted  . Cryptogenic stroke ([place]) 07/14/2017  . Chronic venous insufficiency 12/20/2016  . Varicose veins of bilateral lower extremities with pain 12/20/2016  . Lymphedema 12/20/2016  . DM (diabetes mellitus) type II uncontrolled, periph vascular disorder ([place]) 12/31/2014  . Edema 06/09/2014  . History of bacterial pneumonia 05/24/2014  . OSA (obstructive sleep apnea) 04/18/2014  . CAP (community acquired pneumonia) 03/15/2014  . Viral URI with cough 02/18/2014  . CHF (congestive heart failure) ([place]) 11/29/2013  . Acute renal failure ([place]) 11/29/2013  . Preop examination 10/25/2013  . Severe obesity (BMI >= 40) ([place]) 01/17/2013  . Back pain with radiation 05/17/2011  . Urinary frequency 05/17/2011  . COPD (chronic obstructive pulmonary disease) ([place]) 02/23/2011  . DOE (dyspnea on exertion) 02/22/2011  . UNSPECIFIED VITAMIN D DEFICIENCY 06/18/2010  . KNEE PAIN, RIGHT 04/07/2010  . SINUSITIS - ACUTE-NOS 03/17/2009  . RESTLESS LEG SYNDROME, MILD 01/06/2009  . Essential hypertension 12/02/2008  . MAMMOGRAM, ABNORMAL, RIGHT 04/08/2008  . POSTMENOPAUSAL STATUS 10/16/2007  . Hyperlipidemia 11/08/2006  . THROMBOCYTOPENIA NOS 11/08/2006  . Anxiety 11/08/2006  . Depression, major, single episode, mild ([place]) 11/08/2006   Current Outpatient Medications on File Prior to Visit  Medication Sig Dispense Refill  . Acetaminophen (TYLENOL 8 HOUR PO) Take 500 mg by mouth.    Marland Kitchen albuterol (PROVENTIL) (2.5 MG/3ML) 0.083%  nebulizer solution Take 3 mLs (2.5 mg total) by nebulization every 6 (six) hours as needed for wheezing or shortness of breath. 75 mL 12  . albuterol (VENTOLIN HFA) 108 (90 Base) MCG/ACT inhaler Inhale into the lungs.    Marland Kitchen allopurinol (ZYLOPRIM) 100 MG tablet Take 1 tablet (100 mg total) by mouth daily. 30 tablet 1  . atorvastatin (LIPITOR) 40 MG tablet TAKE 1 TABLET BY MOUTH ONCE DAILY (Patient taking differently: TAKE 1 TABLET BY MOUTH AT BEDTIME) 90 tablet 1  . busPIRone (BUSPAR) 15 MG tablet Take 1 tablet (15 mg total) 3 (three) times daily by mouth. (Patient taking differently: Take 15 mg by mouth 2 (two) times daily. ) 90 tablet 1  . carvedilol (COREG) 12.5 MG tablet TAKE 1 TABLET (12.5 MG TOTAL) BY MOUTH 2 (TWO) TIMES DAILY WITH A MEAL. 60 tablet 0  . clopidogrel (PLAVIX) 75 MG tablet Take 1 tablet (75 mg total) by mouth daily. 30 tablet 1  . ergocalciferol (VITAMIN D2) 50000 UNITS capsule Take 50,000 Units by mouth once a week.    . fenofibrate 160 MG tablet Take 1 tablet (160 mg total) daily by mouth. 90 tablet 2  . ferrous sulfate 325 (65 FE) MG tablet Take 325 mg by mouth.    . Fluticasone-Salmeterol (ADVAIR) 250-50 MCG/DOSE AEPB Inhale 1 puff into the lungs 2 (two) times daily.    . insulin aspart (NOVOLOG FLEXPEN) 100 UNIT/ML FlexPen Inject 8-10 units 3x a day before meals 45 mL 3  . Insulin Glargine (BASAGLAR KWIKPEN) 100 UNIT/ML SOPN Inject 0.4 mLs (40 Units total) into the skin at bedtime. 15 pen 3  . pantoprazole (  PROTONIX) 40 MG tablet Take 1 tablet (40 mg total) by mouth daily. 90 tablet 1  . pramipexole (MIRAPEX) 1 MG tablet TAKE 1 TABLET (1 MG TOTAL) BY MOUTH AT BEDTIME. 90 tablet 0  . sertraline (ZOLOFT) 50 MG tablet Take 1.5 tablets (75 mg total) by mouth daily. 135 tablet 3  . torsemide (DEMADEX) 20 MG tablet TAKE FOUR TABLETS BY MOUTH TWICE DAILY     No current facility-administered medications on file prior to visit.    No Known Allergies  Recent Results (from the past  2160 hour(s))  Lipid panel     Status: Abnormal   Collection Time: 07/12/17 11:15 AM  Result Value Ref Range   Cholesterol 125 0 - 200 mg/dL    Comment: ATP III Classification       Desirable:  < 200 mg/dL               Borderline High:  200 - 239 mg/dL          High:  > = 240 mg/dL   Triglycerides 257.0 (H) 0.0 - 149.0 mg/dL    Comment: Normal:  <150 mg/dLBorderline High:  150 - 199 mg/dL   HDL 38.00 (L) >39.00 mg/dL   VLDL 51.4 (H) 0.0 - 40.0 mg/dL   Total CHOL/HDL Ratio 3     Comment:                Men          Women1/2 Average Risk     3.4          3.3Average Risk          5.0          4.42X Average Risk          9.6          7.13X Average Risk          15.0          11.0                       NonHDL 87.41     Comment: NOTE:  Non-HDL goal should be 30 mg/dL higher than patient's LDL goal (i.e. LDL goal of < 70 mg/dL, would have non-HDL goal of < 100 mg/dL)  Hemoglobin A1c     Status: Abnormal   Collection Time: 07/12/17 11:15 AM  Result Value Ref Range   Hgb A1c MFr Bld 7.2 (H) 4.6 - 6.5 %    Comment: Glycemic Control Guidelines for People with Diabetes:Non Diabetic:  <6%Goal of Therapy: <7%Additional Action Suggested:  >8%   Comprehensive metabolic panel     Status: Abnormal   Collection Time: 07/12/17 11:15 AM  Result Value Ref Range   Sodium 139 135 - 145 mEq/L   Potassium 3.6 3.5 - 5.1 mEq/L   Chloride 101 96 - 112 mEq/L   CO2 28 19 - 32 mEq/L   Glucose, Bld 101 (H) 70 - 99 mg/dL   BUN 39 (H) 6 - 23 mg/dL   Creatinine, Ser 1.35 (H) 0.40 - 1.20 mg/dL   Total Bilirubin 0.7 0.2 - 1.2 mg/dL   Alkaline Phosphatase 66 39 - 117 U/L   AST 39 (H) 0 - 37 U/L   ALT 26 0 - 35 U/L   Total Protein 7.6 6.0 - 8.3 g/dL   Albumin 4.2 3.5 - 5.2 g/dL   Calcium 9.6 8.4 - 10.5 mg/dL   GFR 40.94 (L) >60.00 mL/min  Hepatic function panel     Status: Abnormal   Collection Time: 07/12/17 11:15 AM  Result Value Ref Range   Total Bilirubin 0.7 0.2 - 1.2 mg/dL   Bilirubin, Direct 0.2 0.0 - 0.3  mg/dL   Alkaline Phosphatase 66 39 - 117 U/L   AST 39 (H) 0 - 37 U/L   ALT 26 0 - 35 U/L   Total Protein 7.6 6.0 - 8.3 g/dL   Albumin 4.2 3.5 - 5.2 g/dL  LDL cholesterol, direct     Status: None   Collection Time: 07/12/17 11:15 AM  Result Value Ref Range   Direct LDL 54.0 mg/dL    Comment: Optimal:  <100 mg/dLNear or Above Optimal:  100-129 mg/dLBorderline High:  130-159 mg/dLHigh:  160-189 mg/dLVery High:  >190 mg/dL    Objective: General: Patient is awake, alert, and oriented x 2 and in no acute distress.  Integument: Skin is dry and supple bilateral. Nails are short and thickened and dystrophic with subungual debris, consistent with onychomycosis, 1-5 bilateral.  At the left great toenail medial border there is no swelling, no redness, no warmth, no active drainage, no other signs of infection.  Mild bruise to left fourth toe patient bumped toe this morning with no pain to area.  Minimal callus sub-met 5 bilateral. Remaining integument unremarkable.  Vasculature:  Dorsalis Pedis pulse 1/4 bilateral. Posterior Tibial pulse  0/4 bilateral.  Capillary fill time <5 sec 1-5 bilateral.  No hair growth to the level of the digits. Temperature gradient mildly decreased.  Significant varicosities and hyperpigmentation present bilateral.  Chronic edema present bilateral.   Neurology:Difficult to discern due to patient status.  No Babinski sign present bilateral.   Musculoskeletal: Minimal tenderness to left great toe nail medial margin.  No pain to left fourth toe.  No symptomatic pedal deformities noted bilateral. Muscular strength 5/5 in all lower extremity muscular groups bilateral without pain on range of motion. No tenderness with calf compression bilateral.  Assessment and Plan: Problem List Items Addressed This Visit      Cardiovascular and Mediastinum   DM (diabetes mellitus) type II uncontrolled, periph vascular disorder ([place])    Other Visit Diagnoses    Ingrowing nail    -   Primary   Toe pain, left         -Examined patient. -Discussed the importance of daily foot infection in the setting of diabetes -At no charge trimmed offending left great toenail medial border to patient's tolerance and applied topical lidocaine cream cover with a Band-Aid.  Patient can discontinue Epsom salt and antibiotic cream to area however if pain swelling or redness recurs to resume soaking and return to office. -Patient at this time is not a good candidate for a more aggressive nail avulsion procedure due to diabetes and poor vascular status. Advised patient and daughter-in-law if ingrowing nail seems to recur then will have to get a letter of medical clearance and do ABI testing on patient to see if patient can withstand undergoing a nail procedure; daughter-in-law expressed understanding. -She is to return as needed.  Patient's daughter-in-law advised to call the office if any problems or questions arise in the meantime.  Landis Martins, DPM

## 2017-07-21 DIAGNOSIS — N183 Chronic kidney disease, stage 3 (moderate): Secondary | ICD-10-CM | POA: Diagnosis not present

## 2017-07-21 DIAGNOSIS — I69320 Aphasia following cerebral infarction: Secondary | ICD-10-CM | POA: Diagnosis not present

## 2017-07-21 DIAGNOSIS — E1122 Type 2 diabetes mellitus with diabetic chronic kidney disease: Secondary | ICD-10-CM | POA: Diagnosis not present

## 2017-07-21 DIAGNOSIS — I130 Hypertensive heart and chronic kidney disease with heart failure and stage 1 through stage 4 chronic kidney disease, or unspecified chronic kidney disease: Secondary | ICD-10-CM | POA: Diagnosis not present

## 2017-07-21 DIAGNOSIS — I5033 Acute on chronic diastolic (congestive) heart failure: Secondary | ICD-10-CM | POA: Diagnosis not present

## 2017-07-21 DIAGNOSIS — I69351 Hemiplegia and hemiparesis following cerebral infarction affecting right dominant side: Secondary | ICD-10-CM | POA: Diagnosis not present

## 2017-07-25 DIAGNOSIS — N183 Chronic kidney disease, stage 3 (moderate): Secondary | ICD-10-CM | POA: Diagnosis not present

## 2017-07-25 DIAGNOSIS — I69320 Aphasia following cerebral infarction: Secondary | ICD-10-CM | POA: Diagnosis not present

## 2017-07-25 DIAGNOSIS — I5033 Acute on chronic diastolic (congestive) heart failure: Secondary | ICD-10-CM | POA: Diagnosis not present

## 2017-07-25 DIAGNOSIS — I130 Hypertensive heart and chronic kidney disease with heart failure and stage 1 through stage 4 chronic kidney disease, or unspecified chronic kidney disease: Secondary | ICD-10-CM | POA: Diagnosis not present

## 2017-07-25 DIAGNOSIS — E1122 Type 2 diabetes mellitus with diabetic chronic kidney disease: Secondary | ICD-10-CM | POA: Diagnosis not present

## 2017-07-25 DIAGNOSIS — I69351 Hemiplegia and hemiparesis following cerebral infarction affecting right dominant side: Secondary | ICD-10-CM | POA: Diagnosis not present

## 2017-07-27 ENCOUNTER — Encounter: Payer: Self-pay | Admitting: Family Medicine

## 2017-07-27 DIAGNOSIS — F329 Major depressive disorder, single episode, unspecified: Secondary | ICD-10-CM

## 2017-07-27 DIAGNOSIS — F32A Depression, unspecified: Secondary | ICD-10-CM

## 2017-07-27 MED ORDER — SERTRALINE HCL 50 MG PO TABS: 75.0000 mg | ORAL_TABLET | Freq: Every day | ORAL | 1 refills | Status: DC

## 2017-07-28 ENCOUNTER — Encounter: Payer: Self-pay | Admitting: Internal Medicine

## 2017-07-29 ENCOUNTER — Other Ambulatory Visit: Payer: Self-pay

## 2017-07-29 ENCOUNTER — Telehealth: Payer: Self-pay | Admitting: Family Medicine

## 2017-07-29 DIAGNOSIS — I5033 Acute on chronic diastolic (congestive) heart failure: Secondary | ICD-10-CM | POA: Diagnosis not present

## 2017-07-29 DIAGNOSIS — N183 Chronic kidney disease, stage 3 (moderate): Secondary | ICD-10-CM | POA: Diagnosis not present

## 2017-07-29 DIAGNOSIS — I130 Hypertensive heart and chronic kidney disease with heart failure and stage 1 through stage 4 chronic kidney disease, or unspecified chronic kidney disease: Secondary | ICD-10-CM | POA: Diagnosis not present

## 2017-07-29 DIAGNOSIS — I69351 Hemiplegia and hemiparesis following cerebral infarction affecting right dominant side: Secondary | ICD-10-CM | POA: Diagnosis not present

## 2017-07-29 DIAGNOSIS — I69320 Aphasia following cerebral infarction: Secondary | ICD-10-CM | POA: Diagnosis not present

## 2017-07-29 DIAGNOSIS — E1122 Type 2 diabetes mellitus with diabetic chronic kidney disease: Secondary | ICD-10-CM | POA: Diagnosis not present

## 2017-07-29 NOTE — Telephone Encounter (Signed)
Copied from [place]. Topic: General - Other >> Jul 29, 2017  4:05 PM Margot Ables wrote: Reason for CRM: Requesting VO for ST to continue current plan of care 2x week 8 weeks, 1x week 1 week (same goal)

## 2017-07-30 DIAGNOSIS — I130 Hypertensive heart and chronic kidney disease with heart failure and stage 1 through stage 4 chronic kidney disease, or unspecified chronic kidney disease: Secondary | ICD-10-CM | POA: Diagnosis not present

## 2017-07-30 DIAGNOSIS — E1122 Type 2 diabetes mellitus with diabetic chronic kidney disease: Secondary | ICD-10-CM | POA: Diagnosis not present

## 2017-07-30 DIAGNOSIS — J449 Chronic obstructive pulmonary disease, unspecified: Secondary | ICD-10-CM | POA: Diagnosis not present

## 2017-07-30 DIAGNOSIS — F329 Major depressive disorder, single episode, unspecified: Secondary | ICD-10-CM | POA: Diagnosis not present

## 2017-07-30 DIAGNOSIS — Z7902 Long term (current) use of antithrombotics/antiplatelets: Secondary | ICD-10-CM | POA: Diagnosis not present

## 2017-07-30 DIAGNOSIS — E1151 Type 2 diabetes mellitus with diabetic peripheral angiopathy without gangrene: Secondary | ICD-10-CM | POA: Diagnosis not present

## 2017-07-30 DIAGNOSIS — I5033 Acute on chronic diastolic (congestive) heart failure: Secondary | ICD-10-CM | POA: Diagnosis not present

## 2017-07-30 DIAGNOSIS — Z794 Long term (current) use of insulin: Secondary | ICD-10-CM | POA: Diagnosis not present

## 2017-07-30 DIAGNOSIS — D696 Thrombocytopenia, unspecified: Secondary | ICD-10-CM | POA: Diagnosis not present

## 2017-07-30 DIAGNOSIS — N183 Chronic kidney disease, stage 3 (moderate): Secondary | ICD-10-CM | POA: Diagnosis not present

## 2017-07-30 DIAGNOSIS — E1140 Type 2 diabetes mellitus with diabetic neuropathy, unspecified: Secondary | ICD-10-CM | POA: Diagnosis not present

## 2017-07-30 DIAGNOSIS — Z87440 Personal history of urinary (tract) infections: Secondary | ICD-10-CM | POA: Diagnosis not present

## 2017-07-30 DIAGNOSIS — M109 Gout, unspecified: Secondary | ICD-10-CM | POA: Diagnosis not present

## 2017-07-30 DIAGNOSIS — I69351 Hemiplegia and hemiparesis following cerebral infarction affecting right dominant side: Secondary | ICD-10-CM | POA: Diagnosis not present

## 2017-07-30 DIAGNOSIS — I69320 Aphasia following cerebral infarction: Secondary | ICD-10-CM | POA: Diagnosis not present

## 2017-07-30 DIAGNOSIS — F419 Anxiety disorder, unspecified: Secondary | ICD-10-CM | POA: Diagnosis not present

## 2017-08-01 ENCOUNTER — Ambulatory Visit (HOSPITAL_COMMUNITY)
Admission: RE | Admit: 2017-08-01 | Discharge: 2017-08-01 | Disposition: A | Discharge status: Home / Self Care | Payer: Medicare Other | Source: Ambulatory Visit | Attending: Cardiovascular Disease | Admitting: Cardiovascular Disease

## 2017-08-01 ENCOUNTER — Encounter (HOSPITAL_COMMUNITY)
Admission: RE | Disposition: A | Discharge status: Home / Self Care | Payer: Self-pay | Source: Ambulatory Visit | Attending: Cardiovascular Disease

## 2017-08-01 ENCOUNTER — Encounter (HOSPITAL_COMMUNITY): Payer: Self-pay | Admitting: *Deleted

## 2017-08-01 ENCOUNTER — Ambulatory Visit (HOSPITAL_BASED_OUTPATIENT_CLINIC_OR_DEPARTMENT_OTHER)
Admission: RE | Admit: 2017-08-01 | Discharge: 2017-08-01 | Disposition: A | Discharge status: Home / Self Care | Payer: Medicare Other | Source: Ambulatory Visit | Attending: Cardiology | Admitting: Cardiology

## 2017-08-01 DIAGNOSIS — Z7902 Long term (current) use of antithrombotics/antiplatelets: Secondary | ICD-10-CM | POA: Diagnosis not present

## 2017-08-01 DIAGNOSIS — I509 Heart failure, unspecified: Secondary | ICD-10-CM | POA: Diagnosis not present

## 2017-08-01 DIAGNOSIS — E1122 Type 2 diabetes mellitus with diabetic chronic kidney disease: Secondary | ICD-10-CM | POA: Diagnosis not present

## 2017-08-01 DIAGNOSIS — N189 Chronic kidney disease, unspecified: Secondary | ICD-10-CM | POA: Insufficient documentation

## 2017-08-01 DIAGNOSIS — E785 Hyperlipidemia, unspecified: Secondary | ICD-10-CM | POA: Diagnosis not present

## 2017-08-01 DIAGNOSIS — Z87891 Personal history of nicotine dependence: Secondary | ICD-10-CM | POA: Diagnosis not present

## 2017-08-01 DIAGNOSIS — I517 Cardiomegaly: Secondary | ICD-10-CM

## 2017-08-01 DIAGNOSIS — Z8249 Family history of ischemic heart disease and other diseases of the circulatory system: Secondary | ICD-10-CM | POA: Insufficient documentation

## 2017-08-01 DIAGNOSIS — F329 Major depressive disorder, single episode, unspecified: Secondary | ICD-10-CM | POA: Diagnosis not present

## 2017-08-01 DIAGNOSIS — K219 Gastro-esophageal reflux disease without esophagitis: Secondary | ICD-10-CM | POA: Diagnosis not present

## 2017-08-01 DIAGNOSIS — I6389 Other cerebral infarction: Secondary | ICD-10-CM | POA: Insufficient documentation

## 2017-08-01 DIAGNOSIS — Z794 Long term (current) use of insulin: Secondary | ICD-10-CM | POA: Insufficient documentation

## 2017-08-01 DIAGNOSIS — J45909 Unspecified asthma, uncomplicated: Secondary | ICD-10-CM | POA: Diagnosis not present

## 2017-08-01 DIAGNOSIS — I639 Cerebral infarction, unspecified: Secondary | ICD-10-CM

## 2017-08-01 DIAGNOSIS — I130 Hypertensive heart and chronic kidney disease with heart failure and stage 1 through stage 4 chronic kidney disease, or unspecified chronic kidney disease: Secondary | ICD-10-CM | POA: Insufficient documentation

## 2017-08-01 HISTORY — PX: TEE WITHOUT CARDIOVERSION: SHX5443

## 2017-08-01 HISTORY — PX: LOOP RECORDER INSERTION: EP1214

## 2017-08-01 LAB — GLUCOSE, CAPILLARY: Glucose-Capillary: 116 mg/dL — ABNORMAL HIGH (ref 65–99)

## 2017-08-01 SURGERY — LOOP RECORDER INSERTION

## 2017-08-01 SURGERY — ECHOCARDIOGRAM, TRANSESOPHAGEAL
Anesthesia: Moderate Sedation

## 2017-08-01 MED ORDER — LIDOCAINE-EPINEPHRINE 1 %-1:100000 IJ SOLN
INTRAMUSCULAR | Status: DC | PRN
  Administered 2017-08-01: 20 mL

## 2017-08-01 MED ORDER — SODIUM CHLORIDE 0.9 % IV SOLN: INTRAVENOUS | Status: DC

## 2017-08-01 MED ORDER — LIDOCAINE-EPINEPHRINE 1 %-1:100000 IJ SOLN
INTRAMUSCULAR | Status: AC
  Filled 2017-08-01: qty 1

## 2017-08-01 MED ORDER — MIDAZOLAM HCL 10 MG/2ML IJ SOLN
INTRAMUSCULAR | Status: DC | PRN
  Administered 2017-08-01: 1 mg via INTRAVENOUS
  Administered 2017-08-01 (×2): 2 mg via INTRAVENOUS

## 2017-08-01 MED ORDER — BUTAMBEN-TETRACAINE-BENZOCAINE 2-2-14 % EX AERO
INHALATION_SPRAY | CUTANEOUS | Status: DC | PRN
  Administered 2017-08-01: 2 via TOPICAL

## 2017-08-01 MED ORDER — FENTANYL CITRATE (PF) 100 MCG/2ML IJ SOLN
INTRAMUSCULAR | Status: DC | PRN
  Administered 2017-08-01 (×2): 25 ug via INTRAVENOUS

## 2017-08-01 MED ORDER — FENTANYL CITRATE (PF) 100 MCG/2ML IJ SOLN
INTRAMUSCULAR | Status: AC
  Filled 2017-08-01: qty 2

## 2017-08-01 MED ORDER — MIDAZOLAM HCL 5 MG/ML IJ SOLN
INTRAMUSCULAR | Status: AC
  Filled 2017-08-01: qty 2

## 2017-08-01 SURGICAL SUPPLY — 2 items
LOOP REVEAL LINQSYS (Prosthesis & Implant Heart) ×2 IMPLANT
PACK LOOP INSERTION (CUSTOM PROCEDURE TRAY) ×2 IMPLANT

## 2017-08-01 NOTE — Op Note (Signed)
INDICATIONS: cryptogenic stroke  PROCEDURE:   Informed consent was obtained prior to the procedure. The risks, benefits and alternatives for the procedure were discussed and the patient comprehended these risks.  Risks include, but are not limited to, cough, sore throat, vomiting, nausea, somnolence, esophageal and stomach trauma or perforation, bleeding, low blood pressure, aspiration, pneumonia, infection, trauma to the teeth and death.    After a procedural time-out, the oropharynx was anesthetized with 20% benzocaine spray.   During this procedure the patient was administered a total of Versed 5 mg and Fentanyl 50 mcg to achieve and maintain moderate conscious sedation.  The patient's heart rate, blood pressure, and oxygen saturationweare monitored continuously during the procedure. The period of conscious sedation was 10 minutes, of which I was present face-to-face 100% of this time.  The transesophageal probe was inserted in the esophagus and stomach without difficulty and multiple views were obtained.  The patient was kept under observation until the patient left the procedure room.  The patient left the procedure room in stable condition.   Agitated microbubble saline contrast was administered.  COMPLICATIONS:    There were no immediate complications.  FINDINGS:  Normal study. No LA/LV thrombus/mass, no vegetations, no R-L shunt, mild aortic arch atherosclerosis.  RECOMMENDATIONS:     Proceed with loop recorder.  Time Spent Directly with the Patient:  30 minutes   Valrie Jia 08/01/2017, 9:59 AM

## 2017-08-01 NOTE — Progress Notes (Signed)
  Echocardiogram Echocardiogram Transesophageal has been performed.  Makiah Foye G Njeri Vicente 08/01/2017, 10:38 AM

## 2017-08-01 NOTE — Discharge Instructions (Signed)

## 2017-08-01 NOTE — Interval H&P Note (Signed)
History and Physical Interval Note:  08/01/2017 8:32 AM  Erin [name]  has presented today for surgery, with the diagnosis of STROKE  The various methods of treatment have been discussed with the patient and family. After consideration of risks, benefits and other options for treatment, the patient has consented to  Procedure(s): TRANSESOPHAGEAL ECHOCARDIOGRAM (TEE) (N/A) as a surgical intervention .  The patient's history has been reviewed, patient examined, no change in status, stable for surgery.  I have reviewed the patient's chart and labs.  Questions were answered to the patient's satisfaction.     Annaliza Zia

## 2017-08-01 NOTE — H&P (Signed)
Erin [name] has presented today for surgery, with the diagnosis of cryptogenic stroke.  The various methods of treatment have been discussed with the patient and family. After consideration of risks, benefits and other options for treatment, the patient has consented to  Procedure(s): LINQ monitor implant as a surgical intervention .  Risks include but not limited to bleeding, tamponade, infection, pneumothorax, among others. The patient's history has been reviewed, patient examined, no change in status, stable for surgery.  I have reviewed the patient's chart and labs.  Questions were answered to the patient's satisfaction.    Erin Coleman Curt Bears, MD 08/01/2017 10:19 AM

## 2017-08-02 ENCOUNTER — Telehealth: Payer: Self-pay | Admitting: *Deleted

## 2017-08-02 NOTE — Telephone Encounter (Signed)
Spoke with patient's daughter, Marcie Bal [facility]), to request manual transmission from patient's Carelink monitor.  Gave transmission instructions.  Marcie Bal denies questions at this time.  Received alert for 2 "pause" episodes--no ECGs received.  Will review ECGs when manual transmission received.

## 2017-08-02 NOTE — Telephone Encounter (Signed)
Roxanne notified of verbal orders

## 2017-08-03 DIAGNOSIS — I69320 Aphasia following cerebral infarction: Secondary | ICD-10-CM | POA: Diagnosis not present

## 2017-08-03 DIAGNOSIS — I69351 Hemiplegia and hemiparesis following cerebral infarction affecting right dominant side: Secondary | ICD-10-CM | POA: Diagnosis not present

## 2017-08-03 DIAGNOSIS — N183 Chronic kidney disease, stage 3 (moderate): Secondary | ICD-10-CM | POA: Diagnosis not present

## 2017-08-03 DIAGNOSIS — I130 Hypertensive heart and chronic kidney disease with heart failure and stage 1 through stage 4 chronic kidney disease, or unspecified chronic kidney disease: Secondary | ICD-10-CM | POA: Diagnosis not present

## 2017-08-03 DIAGNOSIS — E1122 Type 2 diabetes mellitus with diabetic chronic kidney disease: Secondary | ICD-10-CM | POA: Diagnosis not present

## 2017-08-03 DIAGNOSIS — I5033 Acute on chronic diastolic (congestive) heart failure: Secondary | ICD-10-CM | POA: Diagnosis not present

## 2017-08-05 DIAGNOSIS — N183 Chronic kidney disease, stage 3 (moderate): Secondary | ICD-10-CM | POA: Diagnosis not present

## 2017-08-05 DIAGNOSIS — E1122 Type 2 diabetes mellitus with diabetic chronic kidney disease: Secondary | ICD-10-CM | POA: Diagnosis not present

## 2017-08-05 DIAGNOSIS — I69320 Aphasia following cerebral infarction: Secondary | ICD-10-CM | POA: Diagnosis not present

## 2017-08-05 DIAGNOSIS — I69351 Hemiplegia and hemiparesis following cerebral infarction affecting right dominant side: Secondary | ICD-10-CM | POA: Diagnosis not present

## 2017-08-05 DIAGNOSIS — I130 Hypertensive heart and chronic kidney disease with heart failure and stage 1 through stage 4 chronic kidney disease, or unspecified chronic kidney disease: Secondary | ICD-10-CM | POA: Diagnosis not present

## 2017-08-05 DIAGNOSIS — I5033 Acute on chronic diastolic (congestive) heart failure: Secondary | ICD-10-CM | POA: Diagnosis not present

## 2017-08-05 NOTE — Telephone Encounter (Signed)
Manual transmission received.  "Pause" episodes are not available for review as they are from 4/29 (likely during ILR implant) and device was cleared that day.

## 2017-08-08 ENCOUNTER — Ambulatory Visit (INDEPENDENT_AMBULATORY_CARE_PROVIDER_SITE_OTHER): Payer: Self-pay | Admitting: *Deleted

## 2017-08-08 DIAGNOSIS — I639 Cerebral infarction, unspecified: Secondary | ICD-10-CM

## 2017-08-08 LAB — CUP PACEART INCLINIC DEVICE CHECK
Date Time Interrogation Session: 20190506133408
Implantable Pulse Generator Implant Date: 20190429

## 2017-08-08 NOTE — Progress Notes (Signed)
Wound check in clinic s/p ILR implant. Steri strips removed. Incision edges approximated. Wound well healed without redness or edema. Normal ILR device function. Battery status: GOOD. R-waves 0.81mV. 0 symptom episodes, 0 tachy episodes, 0 pause episodes, 0 brady episodes. 0 AF episodes (0% burden). Patient education completed including wound care, remote monitoring, and routine follow up. Monthly summary reports and ROV with WC PRN.

## 2017-08-09 DIAGNOSIS — I69320 Aphasia following cerebral infarction: Secondary | ICD-10-CM | POA: Diagnosis not present

## 2017-08-09 DIAGNOSIS — I69351 Hemiplegia and hemiparesis following cerebral infarction affecting right dominant side: Secondary | ICD-10-CM | POA: Diagnosis not present

## 2017-08-09 DIAGNOSIS — N183 Chronic kidney disease, stage 3 (moderate): Secondary | ICD-10-CM | POA: Diagnosis not present

## 2017-08-09 DIAGNOSIS — I130 Hypertensive heart and chronic kidney disease with heart failure and stage 1 through stage 4 chronic kidney disease, or unspecified chronic kidney disease: Secondary | ICD-10-CM | POA: Diagnosis not present

## 2017-08-09 DIAGNOSIS — E1122 Type 2 diabetes mellitus with diabetic chronic kidney disease: Secondary | ICD-10-CM | POA: Diagnosis not present

## 2017-08-09 DIAGNOSIS — I5033 Acute on chronic diastolic (congestive) heart failure: Secondary | ICD-10-CM | POA: Diagnosis not present

## 2017-08-12 DIAGNOSIS — I69320 Aphasia following cerebral infarction: Secondary | ICD-10-CM | POA: Diagnosis not present

## 2017-08-12 DIAGNOSIS — I69351 Hemiplegia and hemiparesis following cerebral infarction affecting right dominant side: Secondary | ICD-10-CM | POA: Diagnosis not present

## 2017-08-12 DIAGNOSIS — I130 Hypertensive heart and chronic kidney disease with heart failure and stage 1 through stage 4 chronic kidney disease, or unspecified chronic kidney disease: Secondary | ICD-10-CM | POA: Diagnosis not present

## 2017-08-12 DIAGNOSIS — E1122 Type 2 diabetes mellitus with diabetic chronic kidney disease: Secondary | ICD-10-CM | POA: Diagnosis not present

## 2017-08-12 DIAGNOSIS — I5033 Acute on chronic diastolic (congestive) heart failure: Secondary | ICD-10-CM | POA: Diagnosis not present

## 2017-08-12 DIAGNOSIS — N183 Chronic kidney disease, stage 3 (moderate): Secondary | ICD-10-CM | POA: Diagnosis not present

## 2017-08-13 ENCOUNTER — Other Ambulatory Visit: Payer: Self-pay | Admitting: Internal Medicine

## 2017-08-15 DIAGNOSIS — E1122 Type 2 diabetes mellitus with diabetic chronic kidney disease: Secondary | ICD-10-CM | POA: Diagnosis not present

## 2017-08-15 DIAGNOSIS — N183 Chronic kidney disease, stage 3 (moderate): Secondary | ICD-10-CM | POA: Diagnosis not present

## 2017-08-15 DIAGNOSIS — I130 Hypertensive heart and chronic kidney disease with heart failure and stage 1 through stage 4 chronic kidney disease, or unspecified chronic kidney disease: Secondary | ICD-10-CM | POA: Diagnosis not present

## 2017-08-15 DIAGNOSIS — I5033 Acute on chronic diastolic (congestive) heart failure: Secondary | ICD-10-CM | POA: Diagnosis not present

## 2017-08-15 DIAGNOSIS — I69351 Hemiplegia and hemiparesis following cerebral infarction affecting right dominant side: Secondary | ICD-10-CM | POA: Diagnosis not present

## 2017-08-15 DIAGNOSIS — I69320 Aphasia following cerebral infarction: Secondary | ICD-10-CM | POA: Diagnosis not present

## 2017-08-19 ENCOUNTER — Encounter: Payer: Self-pay | Admitting: Internal Medicine

## 2017-08-19 ENCOUNTER — Ambulatory Visit (INDEPENDENT_AMBULATORY_CARE_PROVIDER_SITE_OTHER): Payer: Medicare Other | Admitting: Internal Medicine

## 2017-08-19 VITALS — BP 130/58 | HR 71 | Temp 97.7°F | Ht 64.0 in | Wt 216.2 lb

## 2017-08-19 DIAGNOSIS — E1151 Type 2 diabetes mellitus with diabetic peripheral angiopathy without gangrene: Secondary | ICD-10-CM | POA: Diagnosis not present

## 2017-08-19 DIAGNOSIS — I69320 Aphasia following cerebral infarction: Secondary | ICD-10-CM | POA: Diagnosis not present

## 2017-08-19 DIAGNOSIS — I639 Cerebral infarction, unspecified: Secondary | ICD-10-CM

## 2017-08-19 DIAGNOSIS — IMO0002 Reserved for concepts with insufficient information to code with codable children: Secondary | ICD-10-CM

## 2017-08-19 DIAGNOSIS — E1165 Type 2 diabetes mellitus with hyperglycemia: Secondary | ICD-10-CM | POA: Diagnosis not present

## 2017-08-19 DIAGNOSIS — I5033 Acute on chronic diastolic (congestive) heart failure: Secondary | ICD-10-CM | POA: Diagnosis not present

## 2017-08-19 DIAGNOSIS — E785 Hyperlipidemia, unspecified: Secondary | ICD-10-CM | POA: Diagnosis not present

## 2017-08-19 DIAGNOSIS — E1122 Type 2 diabetes mellitus with diabetic chronic kidney disease: Secondary | ICD-10-CM | POA: Diagnosis not present

## 2017-08-19 DIAGNOSIS — I69351 Hemiplegia and hemiparesis following cerebral infarction affecting right dominant side: Secondary | ICD-10-CM | POA: Diagnosis not present

## 2017-08-19 DIAGNOSIS — N183 Chronic kidney disease, stage 3 (moderate): Secondary | ICD-10-CM | POA: Diagnosis not present

## 2017-08-19 DIAGNOSIS — I130 Hypertensive heart and chronic kidney disease with heart failure and stage 1 through stage 4 chronic kidney disease, or unspecified chronic kidney disease: Secondary | ICD-10-CM | POA: Diagnosis not present

## 2017-08-19 MED ORDER — INSULIN PEN NEEDLE 32G X 4 MM MISC: 3 refills | Status: DC

## 2017-08-19 MED ORDER — BASAGLAR KWIKPEN 100 UNIT/ML ~~LOC~~ SOPN: 30.0000 [IU] | PEN_INJECTOR | Freq: Every day | SUBCUTANEOUS | 3 refills | Status: DC

## 2017-08-19 MED ORDER — GLUCOSE BLOOD VI STRP: ORAL_STRIP | 3 refills | Status: DC

## 2017-08-19 NOTE — Patient Instructions (Addendum)
Please decrease: - Basaglar 30 units at bedtime  Please continue: - NovoLog 8-10 units before each meal If sugars 60-80, give only 50% of the dose If sugars <60, hold NovoLog  Check sugars 3x a day.  Please return in 4 months with your sugar log.

## 2017-08-19 NOTE — Progress Notes (Signed)
Patient ID: Erin [name], female   DOB: 10-21-1945, 72 y.o.   MRN: [id number]  HPI: Erin [name] is a 72 y.o.-year-old female, returning for f/u for DM2, dx 2013, insulin-dependent, uncontrolled, with complications (CKD). Last visit 3 months ago.  She is here with her daughter who offers almost all of the history as patient is mostly nonverbal.    She had a stroke in 04/2017.  This affected her speech only.  She lost almost 50 pounds since the stroke until our last visit  - 11 since then.  She continues to be in a wheelchair. She lives with her son.   Last hemoglobin A1c was: Lab Results  Component Value Date   HGBA1C 7.2 (H) 07/12/2017   HGBA1C 10.6 (H) 01/25/2017   HGBA1C 8.4 11/08/2016   HGBA1C 12.4 07/23/2016   HGBA1C 11.2 Repeated and verified X2. (H) 01/06/2016   HGBA1C 8.4 05/01/2015   HGBA1C 9.0 (H) 12/10/2014   HGBA1C 5.8 01/11/2014   HGBA1C 6.8 (H) 08/02/2013   HGBA1C 7.1 (H) 07/05/2013  04/27/2017: HbA1c 11.2%  Pt was on a regimen of: - Glipizide 10 mg bid - Lantus 27 units in am  Then (doughnut hole): - Glipizide 10 mg 2x a day before meals. - Novolin ReliOn 70/30 - 15-30 min before meals - 40 units before Breakfast - 40 units before Lunch  - 45 units before Dinner She had to stop Tradjenta 5 mg for 1 year b/c price >> restarted now but still very expensive.  Had to stop Metformin 2/2 AKI after her back sx.  After hospital discharge, she was on: - Levemir 45 units at bedtime  - Novolog sliding scale only (!):  Target 200, ISF 25  At last visit, we changed to: - Basaglar 40 units at bedtime - NovoLog 8-10 units before each meal If sugars 60-80, give only 50% of the dose If sugars <60, hold NovoLog  Pt was checking her sugars 4 X a day - am:  150-190s, some 200s >> 160-184, 206 >> 96-135 - 2h after b'fast: 300s >> 185-220 >> n/c - before lunch:200-244 >> n/c >> 187-223, 253 >> 62-123, 156 - 2h after lunch: 300s-400 >> 207-244 >> n/c - before dinner:  200-300 >> 200s >> 181-258 >> 102-134, 168 - 2h after dinner: 187-259, 280 >> n/c >> 290s >> n/c - bedtime: 95-126 >> n/c >> 209-243 Lowest sugar was 150 >> 161 ; she has hypoglycemia awareness in the 70s Highest sugar was 400s >> 300s >> 258.  Meals: - Breakfast: eggs + toast; occas. grits - Lunch: sandwich, soup - Dinner: meat + veggies + starch or sandwich - Snacks: grapes, icecream  -+ CKD, last BUN/creatinine:  Lab Results  Component Value Date   BUN 39 (H) 07/12/2017   CREATININE 1.35 (H) 07/12/2017  04/28/2017: GFR 41 04/27/2017: 34/1.45, GFR 34 11/30/2013 [facility] - Cr 1.8  Off lisinopril. -+ HL; last set of lipids: Lab Results  Component Value Date   CHOL 125 07/12/2017   HDL 38.00 (L) 07/12/2017   LDLCALC 33 10/18/2016   LDLDIRECT 54.0 07/12/2017   TRIG 257.0 (H) 07/12/2017   CHOLHDL 3 07/12/2017  On Lipitor. - last eye exam was in 06/2016: No DR. + Cataracts. She had R cataract sx. - + numbness and tingling in her  big toe, not in feet. Has an ingrown toenail.  She also has a history of COPD, depression, vit D def. -latest vitamin D level from 04/27/2017: 19.2  ROS:  Constitutional: + weight loss, no fatigue, no subjective hyperthermia, no subjective hypothermia Eyes: no blurry vision, no xerophthalmia ENT: no sore throat, no nodules palpated in throat, no dysphagia, no odynophagia, no hoarseness Cardiovascular: no CP/no SOB/no palpitations/no leg swelling Respiratory: no cough/no SOB/no wheezing Gastrointestinal: no N/no V/no D/no C/no acid reflux Musculoskeletal: no muscle aches/no joint aches Skin: no rashes, no hair loss Neurological: no tremors/+ numbness/+ tingling/no dizziness  I reviewed pt's medications, allergies, PMH, social hx, family hx, and changes were documented in the history of present illness. Otherwise, unchanged from my initial visit note.  PE: BP (!) 130/58 (BP Location: Left Arm, Patient Position: Sitting, Cuff Size:  Normal)   Pulse 71   Temp 97.7 F (36.5 C) (Oral)   Ht 5\' 4"  (1.626 m)   Wt 216 lb 3.2 oz (98.1 kg)   SpO2 96%   BMI 37.11 kg/m  Body mass index is 37.11 kg/m. Wt Readings from Last 3 Encounters:  08/19/17 216 lb 3.2 oz (98.1 kg)  07/12/17 227 lb 3.2 oz (103.1 kg)  07/05/17 227 lb (103 kg)   Constitutional: overweight, in NAD Eyes: PERRLA, EOMI, no exophthalmos ENT: moist mucous membranes, no thyromegaly, no cervical lymphadenopathy Cardiovascular: RRR, No MRG, + BLE edema Respiratory: CTA B Gastrointestinal: abdomen soft, NT, ND, BS+ Musculoskeletal: no deformities, strength intact in all 4 Skin: moist, warm, no rashes Neurological: no tremor with outstretched hands, DTR normal in all 4  ASSESSMENT: 1. DM2, insulin-dependent, uncontrolled, with complications - CKD  2. Obesity class 3 BMI Classification:  < 18.5 underweight   18.5-24.9 normal weight   25.0-29.9 overweight   30.0-34.9 class I obesity   35.0-39.9 class II obesity   ? 40.0 class III obesity   3. HL  PLAN:  1. Patient with long-standing, uncontrolled, type 2 diabetes, on basal-bolus insulin regimen, with worsening blood sugar control around the time of her stroke in 04/2016, but significant improvement since last visit, after we adjusted her regimen, and after she lost weight.  She had her daughter mentioned that she is not hungry, but does eat with every meal.  She lives with her son now and that he make sure that she checks sugars, takes insulin, and eats at all meals. - She now developed mild lows, in the 70s, and occasionally in the 60s.  Therefore, we will decrease her Basaglar dose by 25%., - We will continue NovoLog 8 units mostly, but occasionally 10 units with a larger meal.  She had an occasional spike in the 200s after Easter or after [employer].  Otherwise, sugars are excellent. - I advised her to:  Patient Instructions  Please decrease: - Basaglar 30 units at bedtime  Please  continue: - NovoLog 8-10 units before each meal If sugars 60-80, give only 50% of the dose If sugars <60, hold NovoLog  Check sugars 3x a day.  Please return in 4 months with your sugar log.   - Reviewed together her recent HbA1c from last month, which was dramatically decreased, to 7.2%, from 10.6%. - continue checking sugars at different times of the day - check 1x a day, rotating checks - advised for yearly eye exams >> she is due - Return to clinic in 4 mo with sugar log    2. Obesity class 3 - She lost 11 pounds since last visit, but almost 50 lbs lost since 01/2017  3. HL - Reviewed latest lipid panel from 07/2017.  LDL was at goal but she had  elevated triglycerides. - Continues the statin and fenofibrate without side effects.  Philemon Kingdom, MD PhD [facility]

## 2017-08-22 ENCOUNTER — Telehealth: Payer: Self-pay | Admitting: *Deleted

## 2017-08-22 ENCOUNTER — Other Ambulatory Visit: Payer: Self-pay | Admitting: Family Medicine

## 2017-08-22 NOTE — Telephone Encounter (Signed)
Received Home Health Certification and Plan of Care; forwarded to provider/SLS 05/20

## 2017-08-24 DIAGNOSIS — I69320 Aphasia following cerebral infarction: Secondary | ICD-10-CM | POA: Diagnosis not present

## 2017-08-24 DIAGNOSIS — I130 Hypertensive heart and chronic kidney disease with heart failure and stage 1 through stage 4 chronic kidney disease, or unspecified chronic kidney disease: Secondary | ICD-10-CM | POA: Diagnosis not present

## 2017-08-24 DIAGNOSIS — I69351 Hemiplegia and hemiparesis following cerebral infarction affecting right dominant side: Secondary | ICD-10-CM | POA: Diagnosis not present

## 2017-08-24 DIAGNOSIS — E1122 Type 2 diabetes mellitus with diabetic chronic kidney disease: Secondary | ICD-10-CM | POA: Diagnosis not present

## 2017-08-24 DIAGNOSIS — I5033 Acute on chronic diastolic (congestive) heart failure: Secondary | ICD-10-CM | POA: Diagnosis not present

## 2017-08-24 DIAGNOSIS — N183 Chronic kidney disease, stage 3 (moderate): Secondary | ICD-10-CM | POA: Diagnosis not present

## 2017-08-26 ENCOUNTER — Encounter: Payer: Self-pay | Admitting: Cardiology

## 2017-08-26 DIAGNOSIS — I5033 Acute on chronic diastolic (congestive) heart failure: Secondary | ICD-10-CM | POA: Diagnosis not present

## 2017-08-26 DIAGNOSIS — I69320 Aphasia following cerebral infarction: Secondary | ICD-10-CM | POA: Diagnosis not present

## 2017-08-26 DIAGNOSIS — I69351 Hemiplegia and hemiparesis following cerebral infarction affecting right dominant side: Secondary | ICD-10-CM | POA: Diagnosis not present

## 2017-08-26 DIAGNOSIS — N183 Chronic kidney disease, stage 3 (moderate): Secondary | ICD-10-CM | POA: Diagnosis not present

## 2017-08-26 DIAGNOSIS — E1122 Type 2 diabetes mellitus with diabetic chronic kidney disease: Secondary | ICD-10-CM | POA: Diagnosis not present

## 2017-08-26 DIAGNOSIS — I130 Hypertensive heart and chronic kidney disease with heart failure and stage 1 through stage 4 chronic kidney disease, or unspecified chronic kidney disease: Secondary | ICD-10-CM | POA: Diagnosis not present

## 2017-08-30 DIAGNOSIS — E1122 Type 2 diabetes mellitus with diabetic chronic kidney disease: Secondary | ICD-10-CM | POA: Diagnosis not present

## 2017-08-30 DIAGNOSIS — I69351 Hemiplegia and hemiparesis following cerebral infarction affecting right dominant side: Secondary | ICD-10-CM | POA: Diagnosis not present

## 2017-08-30 DIAGNOSIS — I69320 Aphasia following cerebral infarction: Secondary | ICD-10-CM | POA: Diagnosis not present

## 2017-08-30 DIAGNOSIS — N183 Chronic kidney disease, stage 3 (moderate): Secondary | ICD-10-CM | POA: Diagnosis not present

## 2017-08-30 DIAGNOSIS — I5033 Acute on chronic diastolic (congestive) heart failure: Secondary | ICD-10-CM | POA: Diagnosis not present

## 2017-08-30 DIAGNOSIS — I130 Hypertensive heart and chronic kidney disease with heart failure and stage 1 through stage 4 chronic kidney disease, or unspecified chronic kidney disease: Secondary | ICD-10-CM | POA: Diagnosis not present

## 2017-08-31 ENCOUNTER — Ambulatory Visit (INDEPENDENT_AMBULATORY_CARE_PROVIDER_SITE_OTHER): Payer: Medicare Other | Admitting: *Deleted

## 2017-08-31 DIAGNOSIS — I639 Cerebral infarction, unspecified: Secondary | ICD-10-CM | POA: Diagnosis not present

## 2017-09-01 ENCOUNTER — Encounter: Payer: Self-pay | Admitting: Family Medicine

## 2017-09-02 ENCOUNTER — Other Ambulatory Visit: Payer: Self-pay

## 2017-09-02 DIAGNOSIS — I130 Hypertensive heart and chronic kidney disease with heart failure and stage 1 through stage 4 chronic kidney disease, or unspecified chronic kidney disease: Secondary | ICD-10-CM | POA: Diagnosis not present

## 2017-09-02 DIAGNOSIS — I69351 Hemiplegia and hemiparesis following cerebral infarction affecting right dominant side: Secondary | ICD-10-CM | POA: Diagnosis not present

## 2017-09-02 DIAGNOSIS — N183 Chronic kidney disease, stage 3 (moderate): Secondary | ICD-10-CM | POA: Diagnosis not present

## 2017-09-02 DIAGNOSIS — E1122 Type 2 diabetes mellitus with diabetic chronic kidney disease: Secondary | ICD-10-CM | POA: Diagnosis not present

## 2017-09-02 DIAGNOSIS — I5033 Acute on chronic diastolic (congestive) heart failure: Secondary | ICD-10-CM | POA: Diagnosis not present

## 2017-09-02 DIAGNOSIS — I69320 Aphasia following cerebral infarction: Secondary | ICD-10-CM | POA: Diagnosis not present

## 2017-09-02 MED ORDER — BUSPIRONE HCL 15 MG PO TABS: 15.0000 mg | ORAL_TABLET | Freq: Three times a day (TID) | ORAL | 1 refills | Status: DC

## 2017-09-02 NOTE — Progress Notes (Signed)
Carelink Summary Report / Loop Recorder 

## 2017-09-05 DIAGNOSIS — E1122 Type 2 diabetes mellitus with diabetic chronic kidney disease: Secondary | ICD-10-CM | POA: Diagnosis not present

## 2017-09-05 DIAGNOSIS — I130 Hypertensive heart and chronic kidney disease with heart failure and stage 1 through stage 4 chronic kidney disease, or unspecified chronic kidney disease: Secondary | ICD-10-CM | POA: Diagnosis not present

## 2017-09-05 DIAGNOSIS — I69351 Hemiplegia and hemiparesis following cerebral infarction affecting right dominant side: Secondary | ICD-10-CM | POA: Diagnosis not present

## 2017-09-05 DIAGNOSIS — N183 Chronic kidney disease, stage 3 (moderate): Secondary | ICD-10-CM | POA: Diagnosis not present

## 2017-09-05 DIAGNOSIS — I5033 Acute on chronic diastolic (congestive) heart failure: Secondary | ICD-10-CM | POA: Diagnosis not present

## 2017-09-05 DIAGNOSIS — I69320 Aphasia following cerebral infarction: Secondary | ICD-10-CM | POA: Diagnosis not present

## 2017-09-06 DIAGNOSIS — E889 Metabolic disorder, unspecified: Secondary | ICD-10-CM | POA: Diagnosis not present

## 2017-09-06 DIAGNOSIS — M9080 Osteopathy in diseases classified elsewhere, unspecified site: Secondary | ICD-10-CM | POA: Diagnosis not present

## 2017-09-06 DIAGNOSIS — E1122 Type 2 diabetes mellitus with diabetic chronic kidney disease: Secondary | ICD-10-CM | POA: Diagnosis not present

## 2017-09-06 DIAGNOSIS — N183 Chronic kidney disease, stage 3 (moderate): Secondary | ICD-10-CM | POA: Diagnosis not present

## 2017-09-06 DIAGNOSIS — E559 Vitamin D deficiency, unspecified: Secondary | ICD-10-CM | POA: Diagnosis not present

## 2017-09-06 DIAGNOSIS — D631 Anemia in chronic kidney disease: Secondary | ICD-10-CM | POA: Diagnosis not present

## 2017-09-06 DIAGNOSIS — I129 Hypertensive chronic kidney disease with stage 1 through stage 4 chronic kidney disease, or unspecified chronic kidney disease: Secondary | ICD-10-CM | POA: Diagnosis not present

## 2017-09-08 ENCOUNTER — Other Ambulatory Visit: Payer: Self-pay | Admitting: Family Medicine

## 2017-09-08 DIAGNOSIS — K219 Gastro-esophageal reflux disease without esophagitis: Secondary | ICD-10-CM

## 2017-09-09 ENCOUNTER — Encounter: Payer: Self-pay | Admitting: Internal Medicine

## 2017-09-09 DIAGNOSIS — I69351 Hemiplegia and hemiparesis following cerebral infarction affecting right dominant side: Secondary | ICD-10-CM | POA: Diagnosis not present

## 2017-09-09 DIAGNOSIS — I5033 Acute on chronic diastolic (congestive) heart failure: Secondary | ICD-10-CM | POA: Diagnosis not present

## 2017-09-09 DIAGNOSIS — N183 Chronic kidney disease, stage 3 (moderate): Secondary | ICD-10-CM | POA: Diagnosis not present

## 2017-09-09 DIAGNOSIS — I130 Hypertensive heart and chronic kidney disease with heart failure and stage 1 through stage 4 chronic kidney disease, or unspecified chronic kidney disease: Secondary | ICD-10-CM | POA: Diagnosis not present

## 2017-09-09 DIAGNOSIS — I69320 Aphasia following cerebral infarction: Secondary | ICD-10-CM | POA: Diagnosis not present

## 2017-09-09 DIAGNOSIS — E1122 Type 2 diabetes mellitus with diabetic chronic kidney disease: Secondary | ICD-10-CM | POA: Diagnosis not present

## 2017-09-12 ENCOUNTER — Ambulatory Visit: Payer: Self-pay | Admitting: Family Medicine

## 2017-09-15 ENCOUNTER — Ambulatory Visit (INDEPENDENT_AMBULATORY_CARE_PROVIDER_SITE_OTHER): Payer: Medicare Other | Admitting: Vascular Surgery

## 2017-09-15 ENCOUNTER — Encounter (INDEPENDENT_AMBULATORY_CARE_PROVIDER_SITE_OTHER): Payer: Self-pay | Admitting: Vascular Surgery

## 2017-09-15 VITALS — BP 153/60 | HR 83 | Resp 16 | Ht 64.0 in | Wt 209.0 lb

## 2017-09-15 DIAGNOSIS — I10 Essential (primary) hypertension: Secondary | ICD-10-CM

## 2017-09-15 DIAGNOSIS — I639 Cerebral infarction, unspecified: Secondary | ICD-10-CM | POA: Diagnosis not present

## 2017-09-15 DIAGNOSIS — J42 Unspecified chronic bronchitis: Secondary | ICD-10-CM | POA: Diagnosis not present

## 2017-09-15 DIAGNOSIS — E1151 Type 2 diabetes mellitus with diabetic peripheral angiopathy without gangrene: Secondary | ICD-10-CM

## 2017-09-15 DIAGNOSIS — I872 Venous insufficiency (chronic) (peripheral): Secondary | ICD-10-CM

## 2017-09-15 DIAGNOSIS — IMO0002 Reserved for concepts with insufficient information to code with codable children: Secondary | ICD-10-CM

## 2017-09-15 DIAGNOSIS — E1165 Type 2 diabetes mellitus with hyperglycemia: Secondary | ICD-10-CM

## 2017-09-15 DIAGNOSIS — I890 Lymphedema, not elsewhere classified: Secondary | ICD-10-CM

## 2017-09-16 ENCOUNTER — Encounter (INDEPENDENT_AMBULATORY_CARE_PROVIDER_SITE_OTHER): Payer: Self-pay | Admitting: Vascular Surgery

## 2017-09-16 NOTE — Progress Notes (Signed)
MRN : [id number]  Erin [name] is a 72 y.o. ([birth year]) female who presents with chief complaint of  Chief Complaint  Patient presents with  . Follow-up  .  History of Present Illness:   The patient returns to the office for followup evaluation regarding leg swelling.  The swelling has persisted and the pain associated with swelling continues. There have not been any interval development of a ulcerations or wounds.  Since the previous visit the patient has been wearing graduated compression stockings and has noted little if any improvement in the lymphedema. The patient has been using compression routinely morning until night.  The patient also states elevation during the day and exercise is being done too.   Current Meds  Medication Sig  . Acetaminophen (TYLENOL 8 HOUR PO) Take 500 mg by mouth.  Marland Kitchen albuterol (PROVENTIL) (2.5 MG/3ML) 0.083% nebulizer solution Take 3 mLs (2.5 mg total) by nebulization every 6 (six) hours as needed for wheezing or shortness of breath.  Marland Kitchen albuterol (VENTOLIN HFA) 108 (90 Base) MCG/ACT inhaler Inhale 2 puffs into the lungs every 4 (four) hours as needed for wheezing or shortness of breath.   . allopurinol (ZYLOPRIM) 100 MG tablet TAKE 1 TABLET BY MOUTH EVERY DAY  . atorvastatin (LIPITOR) 40 MG tablet TAKE 1 TABLET BY MOUTH ONCE DAILY (Patient taking differently: TAKE 1 TABLET BY MOUTH AT BEDTIME)  . busPIRone (BUSPAR) 15 MG tablet Take 1 tablet (15 mg total) by mouth 3 (three) times daily.  . carvedilol (COREG) 12.5 MG tablet TAKE 1 TABLET (12.5 MG TOTAL) BY MOUTH 2 (TWO) TIMES DAILY WITH A MEAL.  Marland Kitchen clopidogrel (PLAVIX) 75 MG tablet TAKE 1 TABLET BY MOUTH EVERY DAY  . ergocalciferol (VITAMIN D2) 50000 UNITS capsule Take 50,000 Units by mouth once a week. Wednesday  . fenofibrate 160 MG tablet Take 1 tablet (160 mg total) daily by mouth.  . ferrous sulfate 325 (65 FE) MG tablet Take 325 mg by mouth.  . Fluticasone-Salmeterol (ADVAIR) 250-50 MCG/DOSE  AEPB Inhale 1 puff into the lungs 2 (two) times daily.  Marland Kitchen glucose blood test strip Use 3x a day - One TOUch Ultra  . insulin aspart (NOVOLOG FLEXPEN) 100 UNIT/ML FlexPen Inject 8-10 units 3x a day before meals (Patient taking differently: Inject 8-10 Units into the skin 3 (three) times daily with meals. Inject 8-10 units 3x a day before meals)  . Insulin Glargine (BASAGLAR KWIKPEN) 100 UNIT/ML SOPN Inject 0.3 mLs (30 Units total) into the skin at bedtime.  . Insulin Pen Needle 32G X 4 MM MISC Use 4x a day  . pantoprazole (PROTONIX) 40 MG tablet TAKE 1 TABLET BY MOUTH EVERY DAY  . pramipexole (MIRAPEX) 1 MG tablet TAKE 1 TABLET (1 MG TOTAL) BY MOUTH AT BEDTIME.  Marland Kitchen sertraline (ZOLOFT) 50 MG tablet Take 1.5 tablets (75 mg total) by mouth daily.  Marland Kitchen torsemide (DEMADEX) 20 MG tablet TAKE FOUR TABLETS BY MOUTH TWICE DAILY    Past Medical History:  Diagnosis Date  . Asthma   . CHF (congestive heart failure) [facility])    hospital 11/2013-- [place]  . Chronic kidney disease   . Depression   . Diabetes mellitus ([place])   . GERD (gastroesophageal reflux disease)   . Hyperlipidemia   . Hypertension   . Stroke [facility])     Past Surgical History:  Procedure Laterality Date  . CHOLECYSTECTOMY  2002  . LOOP RECORDER INSERTION N/A 08/01/2017   Procedure: LOOP RECORDER INSERTION;  Surgeon:  Constance Haw, MD;  Location: [place] [facility];  Service: Cardiovascular;  Laterality: N/A;  . SPINE SURGERY  aug 2015   [facility]  . TEE WITHOUT CARDIOVERSION N/A 08/01/2017   Procedure: TRANSESOPHAGEAL ECHOCARDIOGRAM (TEE);  Surgeon: Sanda Klein, MD;  Location: [facility] ENDOSCOPY;  Service: Cardiovascular;  Laterality: N/A;  . TONSILLECTOMY    . TUBAL LIGATION      Social History Social History   Tobacco Use  . Smoking status: Former Smoker    Packs/day: 1.50    Years: 20.00    Pack years: 30.00    Types: Cigarettes    Last attempt to quit: 08/02/1984    Years since quitting: 33.1  .  Smokeless tobacco: Never Used  Substance Use Topics  . Alcohol use: No  . Drug use: No    Family History Family History  Problem Relation Age of Onset  . Hypertension Mother   . Alzheimer's disease Mother   . Hyperlipidemia Mother   . Heart disease Father        cad  . Asthma Father   . Cancer Father 67       leukemia  . Stroke Father   . Hypertension Father   . Heart disease Sister 32       MI  . Pulmonary fibrosis Sister   . Arthritis Brother   . Heart disease Maternal Uncle   . Stroke Maternal Uncle   . Uterine cancer Paternal Grandmother   . Stomach cancer Paternal Grandfather   . Heart disease Maternal Uncle   . Heart disease Maternal Uncle     No Known Allergies   REVIEW OF SYSTEMS (Negative unless checked)  Constitutional: [] Weight loss  [] Fever  [] Chills Cardiac: [] Chest pain   [] Chest pressure   [] Palpitations   [] Shortness of breath when laying flat   [] Shortness of breath with exertion. Vascular:  [] Pain in legs with walking   [x] Pain in legs at rest  [] History of DVT   [] Phlebitis   [x] Swelling in legs   [] Varicose veins   [] Non-healing ulcers Pulmonary:   [] Uses home oxygen   [] Productive cough   [] Hemoptysis   [] Wheeze  [x] COPD   [] Asthma Neurologic:  [] Dizziness   [] Seizures   [] History of stroke   [] History of TIA  [] Aphasia   [] Vissual changes   [] Weakness or numbness in arm   [] Weakness or numbness in leg Musculoskeletal:   [] Joint swelling   [] Joint pain   [] Low back pain Hematologic:  [] Easy bruising  [] Easy bleeding   [] Hypercoagulable state   [] Anemic Gastrointestinal:  [] Diarrhea   [] Vomiting  [] Gastroesophageal reflux/heartburn   [] Difficulty swallowing. Genitourinary:  [] Chronic kidney disease   [] Difficult urination  [] Frequent urination   [] Blood in urine Skin:  [x] Rashes   [] Ulcers  Psychological:  [] History of anxiety   []  History of major depression.  Physical Examination  Vitals:   09/15/17 1441  BP: (!) 153/60  Pulse: 83  Resp: 16    Weight: 209 lb (94.8 kg)  Height: 5\' 4"  (1.626 m)   Body mass index is 35.87 kg/m. Gen: WD/WN, NAD Head: /AT, No temporalis wasting.  Ear/Nose/Throat: Hearing grossly intact, nares w/o erythema or drainage Eyes: PER, EOMI, sclera nonicteric.  Neck: Supple, no large masses.   Pulmonary:  Good air movement, no audible wheezing bilaterally, no use of accessory muscles.  Cardiac: RRR, no JVD Vascular: scattered varicosities present bilaterally.  moderate venous stasis changes to the legs bilaterally.  3-4+ soft pitting edema  Vessel Right Left  Radial Palpable Palpable  PT Palpable Palpable  DP Palpable Palpable  Gastrointestinal: Non-distended. No guarding/no peritoneal signs.  Musculoskeletal: M/S 5/5 throughout.  No deformity or atrophy.  Neurologic: CN 2-12 intact. Symmetrical.  Speech is fluent. Motor exam as listed above. Psychiatric: Judgment intact, Mood & affect appropriate for pt's clinical situation. Dermatologic: venous rashes no ulcers noted.  No changes consistent with cellulitis. Lymph : No lichenification or skin changes of chronic lymphedema.  CBC Lab Results  Component Value Date   WBC 16.2 (H) 10/14/2016   HGB 10.4 (L) 10/14/2016   HCT 32.0 (L) 10/14/2016   MCV 85.2 10/14/2016   PLT 72.0 (L) 10/14/2016    BMET    Component Value Date/Time   NA 139 07/12/2017 1115   K 3.6 07/12/2017 1115   CL 101 07/12/2017 1115   CO2 28 07/12/2017 1115   GLUCOSE 101 (H) 07/12/2017 1115   BUN 39 (H) 07/12/2017 1115   CREATININE 1.35 (H) 07/12/2017 1115   CREATININE 1.15 (H) 07/27/2012 1705   CALCIUM 9.6 07/12/2017 1115   GFRNONAA 48.09 03/03/2009 0847   GFRAA 59 10/16/2007 0000   CrCl cannot be calculated (Patient's most recent lab result is older than the maximum 21 days allowed.).  COAG No results found for: INR, PROTIME  Radiology No results found.    Assessment/Plan 1. Lymphedema Recommend:  No surgery or intervention at this point in time.    I  have reviewed my previous discussion with the patient regarding swelling and why it causes symptoms.  Patient will continue wearing graduated compression stockings class 1 (20-30 mmHg) on a daily basis. The patient will  beginning wearing the stockings first thing in the morning and removing them in the evening. The patient is instructed specifically not to sleep in the stockings.    In addition, behavioral modification including several periods of elevation of the lower extremities during the day will be continued.  This was reviewed with the patient during the initial visit.  The patient will also continue routine exercise, especially walking on a daily basis as was discussed during the initial visit.    Despite conservative treatments including graduated compression therapy class 1 and behavioral modification including exercise and elevation the patient  has not obtained adequate control of the lymphedema.  The patient still has stage 3 lymphedema and therefore, I believe that a lymph pump should be added to improve the control of the patient's lymphedema.  Additionally, a lymph pump is warranted because it will reduce the risk of cellulitis and ulceration in the future.  Patient should follow-up in six months    2. Chronic venous insufficiency Recommend:  No surgery or intervention at this point in time.    I have reviewed my previous discussion with the patient regarding swelling and why it causes symptoms.  Patient will continue wearing graduated compression stockings class 1 (20-30 mmHg) on a daily basis. The patient will  beginning wearing the stockings first thing in the morning and removing them in the evening. The patient is instructed specifically not to sleep in the stockings.    In addition, behavioral modification including several periods of elevation of the lower extremities during the day will be continued.  This was reviewed with the patient during the initial visit.  The  patient will also continue routine exercise, especially walking on a daily basis as was discussed during the initial visit.    Despite conservative treatments including graduated compression therapy class 1  and behavioral modification including exercise and elevation the patient  has not obtained adequate control of the lymphedema.  The patient still has stage 3 lymphedema and therefore, I believe that a lymph pump should be added to improve the control of the patient's lymphedema.  Additionally, a lymph pump is warranted because it will reduce the risk of cellulitis and ulceration in the future.  Patient should follow-up in six months    3. Essential hypertension Continue antihypertensive medications as already ordered, these medications have been reviewed and there are no changes at this time.   4. DM (diabetes mellitus) type II uncontrolled, periph vascular disorder (HCC) Continue hypoglycemic medications as already ordered, these medications have been reviewed and there are no changes at this time.  Hgb A1C to be monitored as already arranged by primary service   5. Chronic bronchitis, unspecified chronic bronchitis type ([place]) Continue pulmonary medications and aerosols as already ordered, these medications have been reviewed and there are no changes at this time.    Hortencia Pilar, MD  09/16/2017 4:14 PM

## 2017-09-17 ENCOUNTER — Other Ambulatory Visit: Payer: Self-pay | Admitting: Family Medicine

## 2017-09-21 DIAGNOSIS — I69351 Hemiplegia and hemiparesis following cerebral infarction affecting right dominant side: Secondary | ICD-10-CM | POA: Diagnosis not present

## 2017-09-21 DIAGNOSIS — N183 Chronic kidney disease, stage 3 (moderate): Secondary | ICD-10-CM | POA: Diagnosis not present

## 2017-09-21 DIAGNOSIS — I130 Hypertensive heart and chronic kidney disease with heart failure and stage 1 through stage 4 chronic kidney disease, or unspecified chronic kidney disease: Secondary | ICD-10-CM | POA: Diagnosis not present

## 2017-09-21 DIAGNOSIS — E1122 Type 2 diabetes mellitus with diabetic chronic kidney disease: Secondary | ICD-10-CM | POA: Diagnosis not present

## 2017-09-21 DIAGNOSIS — I69320 Aphasia following cerebral infarction: Secondary | ICD-10-CM | POA: Diagnosis not present

## 2017-09-21 DIAGNOSIS — I5033 Acute on chronic diastolic (congestive) heart failure: Secondary | ICD-10-CM | POA: Diagnosis not present

## 2017-09-23 DIAGNOSIS — I69320 Aphasia following cerebral infarction: Secondary | ICD-10-CM | POA: Diagnosis not present

## 2017-09-23 DIAGNOSIS — I5033 Acute on chronic diastolic (congestive) heart failure: Secondary | ICD-10-CM | POA: Diagnosis not present

## 2017-09-23 DIAGNOSIS — N183 Chronic kidney disease, stage 3 (moderate): Secondary | ICD-10-CM | POA: Diagnosis not present

## 2017-09-23 DIAGNOSIS — E1122 Type 2 diabetes mellitus with diabetic chronic kidney disease: Secondary | ICD-10-CM | POA: Diagnosis not present

## 2017-09-23 DIAGNOSIS — I130 Hypertensive heart and chronic kidney disease with heart failure and stage 1 through stage 4 chronic kidney disease, or unspecified chronic kidney disease: Secondary | ICD-10-CM | POA: Diagnosis not present

## 2017-09-23 DIAGNOSIS — I69351 Hemiplegia and hemiparesis following cerebral infarction affecting right dominant side: Secondary | ICD-10-CM | POA: Diagnosis not present

## 2017-09-26 LAB — CUP PACEART REMOTE DEVICE CHECK
Date Time Interrogation Session: 20190529141050
Implantable Pulse Generator Implant Date: 20190429

## 2017-09-27 DIAGNOSIS — N183 Chronic kidney disease, stage 3 (moderate): Secondary | ICD-10-CM | POA: Diagnosis not present

## 2017-09-27 DIAGNOSIS — E1122 Type 2 diabetes mellitus with diabetic chronic kidney disease: Secondary | ICD-10-CM | POA: Diagnosis not present

## 2017-09-27 DIAGNOSIS — I130 Hypertensive heart and chronic kidney disease with heart failure and stage 1 through stage 4 chronic kidney disease, or unspecified chronic kidney disease: Secondary | ICD-10-CM | POA: Diagnosis not present

## 2017-09-27 DIAGNOSIS — I5033 Acute on chronic diastolic (congestive) heart failure: Secondary | ICD-10-CM | POA: Diagnosis not present

## 2017-09-27 DIAGNOSIS — I69351 Hemiplegia and hemiparesis following cerebral infarction affecting right dominant side: Secondary | ICD-10-CM | POA: Diagnosis not present

## 2017-09-27 DIAGNOSIS — I69320 Aphasia following cerebral infarction: Secondary | ICD-10-CM | POA: Diagnosis not present

## 2017-09-28 ENCOUNTER — Other Ambulatory Visit: Payer: Self-pay | Admitting: Internal Medicine

## 2017-10-03 ENCOUNTER — Ambulatory Visit (INDEPENDENT_AMBULATORY_CARE_PROVIDER_SITE_OTHER): Payer: Medicare Other | Admitting: *Deleted

## 2017-10-03 DIAGNOSIS — I639 Cerebral infarction, unspecified: Secondary | ICD-10-CM

## 2017-10-04 NOTE — Progress Notes (Signed)
Carelink Summary Report / Loop Recorder 

## 2017-10-08 ENCOUNTER — Other Ambulatory Visit: Payer: Self-pay | Admitting: Family Medicine

## 2017-10-08 IMAGING — CR DG KNEE 1-2V*R*
2 series · 2 of 2 positions shown · non-contrast
Comparison: 04/08/2010

CLINICAL DATA: RIGHT knee pain for 2-4 days, no known injury,
history gout

EXAM:
RIGHT KNEE - 1-2 VIEW

[t knee ap right]
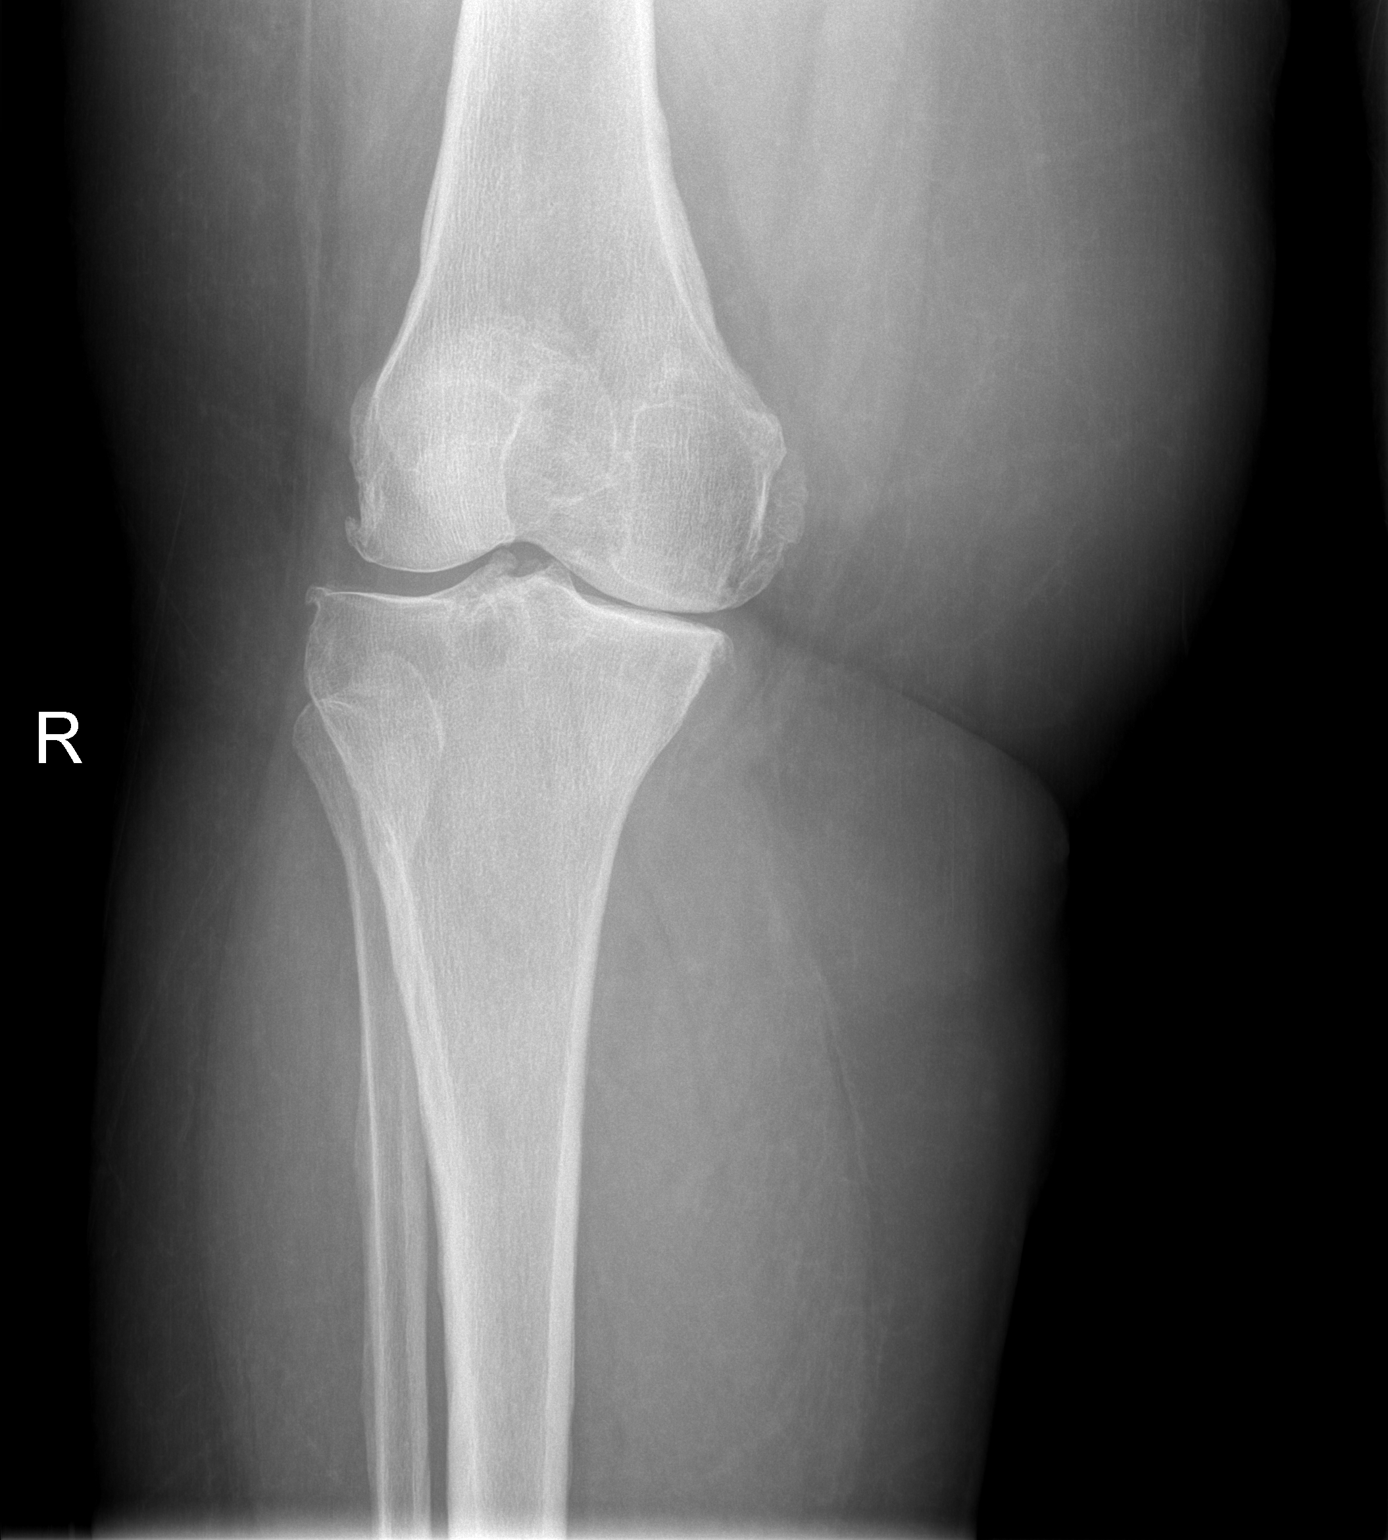

[t knee lat right]
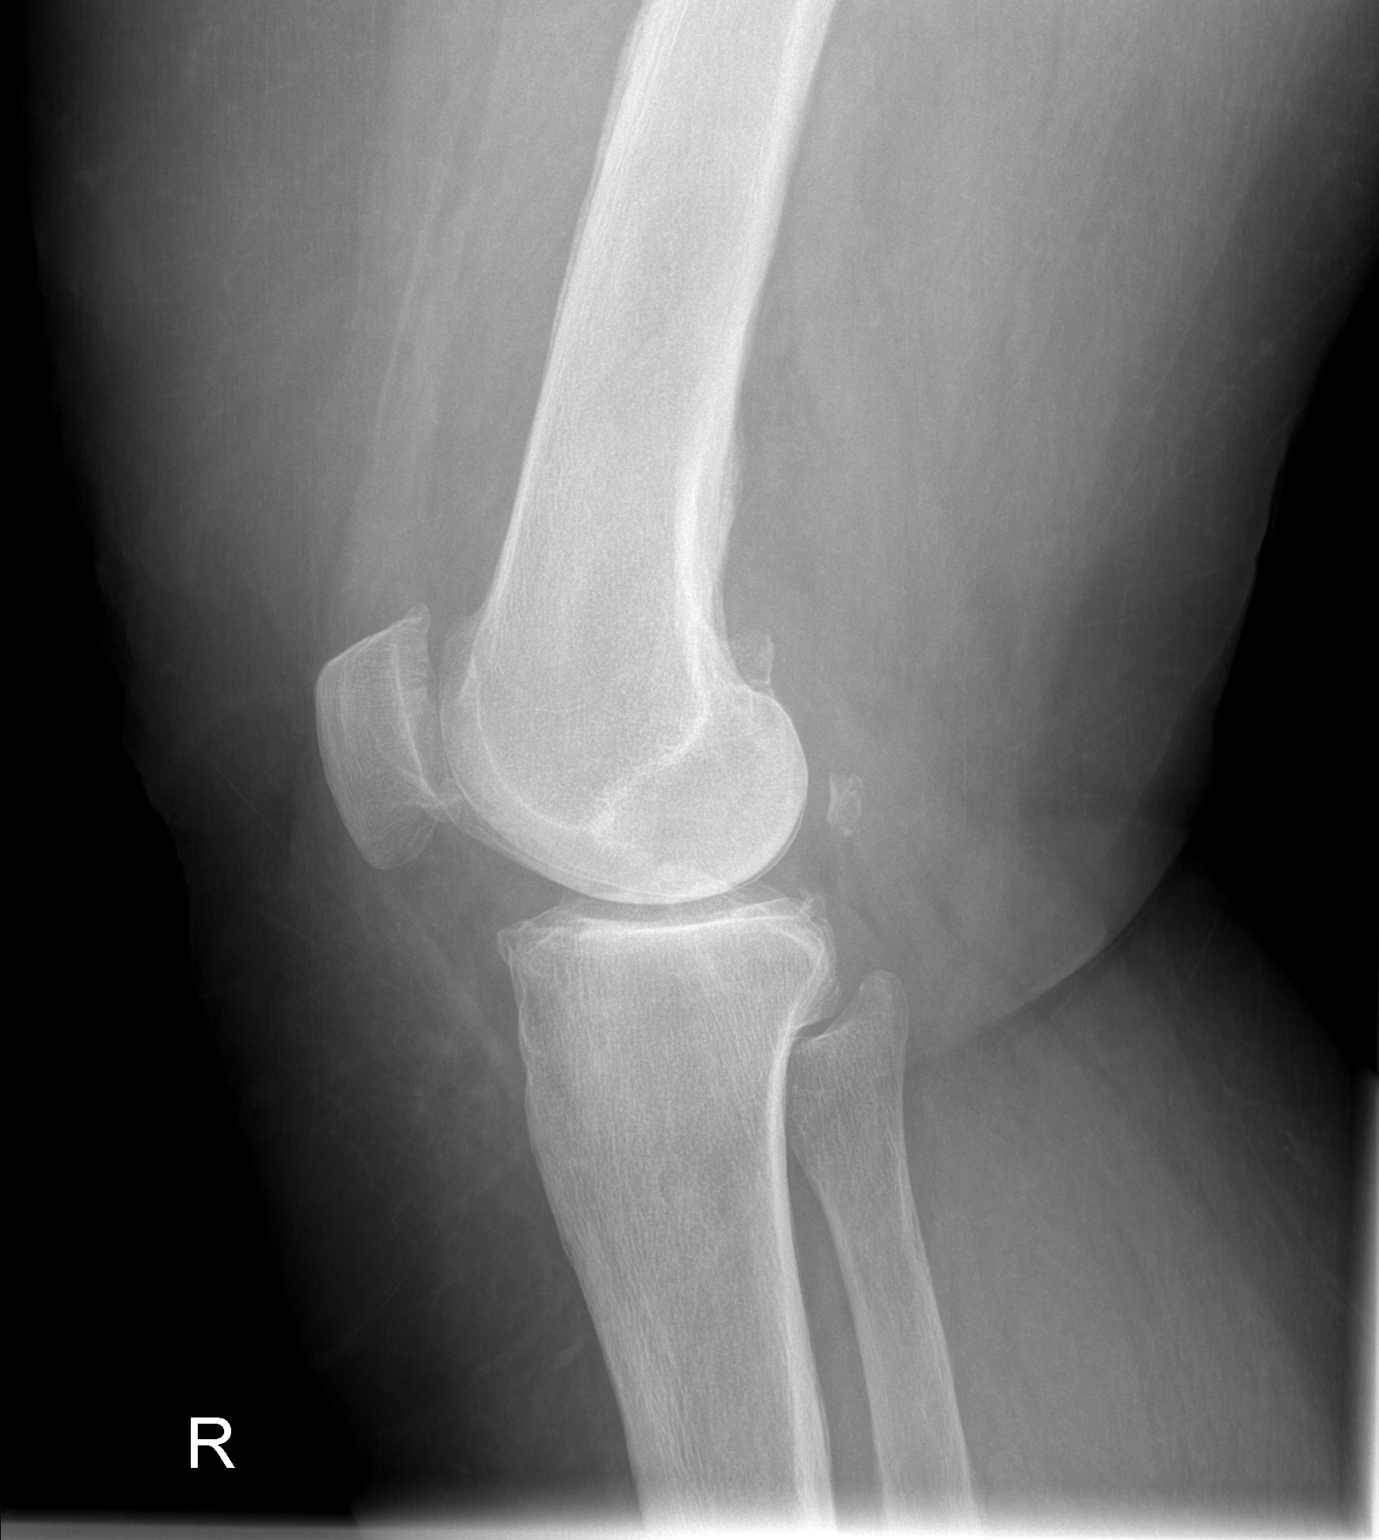

[2 of 2 positions shown; findings below may reference images not displayed]

FINDINGS: Osseous mineralization normal.

Diffuse joint space narrowing and spur formation progressive since
previous exam, greatest at medial compartment.

No acute fracture, dislocation or bone destruction.

Well-formed periosteal new bone identified at the distal RIGHT
femur, question minimally at the RIGHT tibia as well, nonspecific.

No knee joint effusion.
IMPRESSION: Progressive osteoarthritic changes RIGHT knee since 1091.

Scattered periosteal new bone at the femur and minimally at tibial
new since prior exam.

This can be seen with hypertrophic osteoarthropathy, chronic venous
insufficiency, thyroid acropachy, and hypervitaminosis A.

## 2017-10-16 ENCOUNTER — Other Ambulatory Visit: Payer: Self-pay | Admitting: Family Medicine

## 2017-10-17 ENCOUNTER — Other Ambulatory Visit: Payer: Self-pay | Admitting: Family Medicine

## 2017-10-17 DIAGNOSIS — F32A Depression, unspecified: Secondary | ICD-10-CM

## 2017-10-17 DIAGNOSIS — F329 Major depressive disorder, single episode, unspecified: Secondary | ICD-10-CM

## 2017-10-18 ENCOUNTER — Telehealth (INDEPENDENT_AMBULATORY_CARE_PROVIDER_SITE_OTHER): Payer: Self-pay

## 2017-10-18 NOTE — Telephone Encounter (Signed)
MRN [id number]: I set her up with a pump in January and had followed up with her in February.  When I got the order from you that she seemed like she didn't have one, I called her to go see her again. She stated she didn't know what I was talking about and she did not know who Dr. Delana Meyer was and that she had never been to [facility] and had never heard of it. I tried to remind her of everything and she said she didn't have time for me and to call back in 2 weeks. I called her back last week multiple times and she didn't answer. On Friday I went to her home to try and catch her, but she wouldn't answer the door. I called her phone and she answered. She said she didn't know who I was and she had never heard of [facility] or any of you. She said she was sick and hung up on me. I'm not sure what to do with this patient at this point. There clearly seems to be some memory issues, but she will not allow me to see her.

## 2017-10-21 ENCOUNTER — Telehealth: Payer: Self-pay | Admitting: *Deleted

## 2017-10-21 NOTE — Telephone Encounter (Signed)
Received Physician Orders from [facility]; forwarded to provider/SLS 07/19

## 2017-10-28 ENCOUNTER — Other Ambulatory Visit: Payer: Self-pay | Admitting: Family Medicine

## 2017-11-01 LAB — CUP PACEART REMOTE DEVICE CHECK
Date Time Interrogation Session: 20190701140949
Implantable Pulse Generator Implant Date: 20190429

## 2017-11-07 ENCOUNTER — Ambulatory Visit (INDEPENDENT_AMBULATORY_CARE_PROVIDER_SITE_OTHER): Payer: Medicare Other | Admitting: *Deleted

## 2017-11-07 DIAGNOSIS — I639 Cerebral infarction, unspecified: Secondary | ICD-10-CM | POA: Diagnosis not present

## 2017-11-09 NOTE — Progress Notes (Signed)
Carelink Summary Report / Loop Recorder 

## 2017-11-10 ENCOUNTER — Encounter: Payer: Self-pay | Admitting: Gastroenterology

## 2017-11-14 ENCOUNTER — Ambulatory Visit: Payer: Medicare Other | Admitting: Family Medicine

## 2017-11-16 ENCOUNTER — Other Ambulatory Visit: Payer: Self-pay | Admitting: Family Medicine

## 2017-11-16 DIAGNOSIS — R4701 Aphasia: Secondary | ICD-10-CM | POA: Diagnosis not present

## 2017-11-19 ENCOUNTER — Other Ambulatory Visit: Payer: Self-pay | Admitting: Family Medicine

## 2017-11-22 ENCOUNTER — Other Ambulatory Visit: Payer: Self-pay | Admitting: Family Medicine

## 2017-11-24 ENCOUNTER — Telehealth: Payer: Self-pay | Admitting: *Deleted

## 2017-11-24 NOTE — Telephone Encounter (Signed)
Received Home Health Certification and Plan of Care; forwarded to provider/SLS 08/22

## 2017-11-25 DIAGNOSIS — R4701 Aphasia: Secondary | ICD-10-CM | POA: Diagnosis not present

## 2017-11-27 ENCOUNTER — Other Ambulatory Visit: Payer: Self-pay | Admitting: Family Medicine

## 2017-12-07 DIAGNOSIS — R4701 Aphasia: Secondary | ICD-10-CM | POA: Diagnosis not present

## 2017-12-08 ENCOUNTER — Ambulatory Visit (INDEPENDENT_AMBULATORY_CARE_PROVIDER_SITE_OTHER): Payer: Medicare Other | Admitting: *Deleted

## 2017-12-08 DIAGNOSIS — I639 Cerebral infarction, unspecified: Secondary | ICD-10-CM

## 2017-12-09 NOTE — Progress Notes (Signed)
Carelink Summary Report / Loop Recorder 

## 2017-12-12 DIAGNOSIS — R4701 Aphasia: Secondary | ICD-10-CM | POA: Diagnosis not present

## 2017-12-19 ENCOUNTER — Ambulatory Visit (INDEPENDENT_AMBULATORY_CARE_PROVIDER_SITE_OTHER): Payer: Medicare Other | Admitting: Family Medicine

## 2017-12-19 ENCOUNTER — Ambulatory Visit (INDEPENDENT_AMBULATORY_CARE_PROVIDER_SITE_OTHER): Payer: Medicare Other | Admitting: Adult Health

## 2017-12-19 ENCOUNTER — Encounter: Payer: Self-pay | Admitting: Family Medicine

## 2017-12-19 ENCOUNTER — Encounter: Payer: Self-pay | Admitting: Adult Health

## 2017-12-19 VITALS — BP 108/42 | HR 62 | Temp 97.7°F | Resp 16 | Ht 64.0 in | Wt 188.4 lb

## 2017-12-19 VITALS — BP 104/55 | HR 71 | Ht 64.0 in | Wt 190.6 lb

## 2017-12-19 DIAGNOSIS — E1165 Type 2 diabetes mellitus with hyperglycemia: Secondary | ICD-10-CM

## 2017-12-19 DIAGNOSIS — I639 Cerebral infarction, unspecified: Secondary | ICD-10-CM | POA: Diagnosis not present

## 2017-12-19 DIAGNOSIS — E1151 Type 2 diabetes mellitus with diabetic peripheral angiopathy without gangrene: Secondary | ICD-10-CM

## 2017-12-19 DIAGNOSIS — I10 Essential (primary) hypertension: Secondary | ICD-10-CM

## 2017-12-19 DIAGNOSIS — I509 Heart failure, unspecified: Secondary | ICD-10-CM | POA: Diagnosis not present

## 2017-12-19 DIAGNOSIS — E785 Hyperlipidemia, unspecified: Secondary | ICD-10-CM

## 2017-12-19 DIAGNOSIS — Z794 Long term (current) use of insulin: Secondary | ICD-10-CM

## 2017-12-19 DIAGNOSIS — E1140 Type 2 diabetes mellitus with diabetic neuropathy, unspecified: Secondary | ICD-10-CM | POA: Diagnosis not present

## 2017-12-19 DIAGNOSIS — F419 Anxiety disorder, unspecified: Secondary | ICD-10-CM

## 2017-12-19 DIAGNOSIS — R4701 Aphasia: Secondary | ICD-10-CM | POA: Diagnosis not present

## 2017-12-19 DIAGNOSIS — E1169 Type 2 diabetes mellitus with other specified complication: Secondary | ICD-10-CM | POA: Diagnosis not present

## 2017-12-19 DIAGNOSIS — IMO0002 Reserved for concepts with insufficient information to code with codable children: Secondary | ICD-10-CM

## 2017-12-19 DIAGNOSIS — Z23 Encounter for immunization: Secondary | ICD-10-CM

## 2017-12-19 LAB — CUP PACEART REMOTE DEVICE CHECK
Date Time Interrogation Session: 20190803143518
Implantable Pulse Generator Implant Date: 20190429

## 2017-12-19 LAB — COMPREHENSIVE METABOLIC PANEL
ALT: 31 U/L (ref 0–35)
AST: 42 U/L — ABNORMAL HIGH (ref 0–37)
Albumin: 4.4 g/dL (ref 3.5–5.2)
Alkaline Phosphatase: 55 U/L (ref 39–117)
BUN: 52 mg/dL — ABNORMAL HIGH (ref 6–23)
CO2: 30 mEq/L (ref 19–32)
Calcium: 9.4 mg/dL (ref 8.4–10.5)
Chloride: 104 mEq/L (ref 96–112)
Creatinine, Ser: 1.44 mg/dL — ABNORMAL HIGH (ref 0.40–1.20)
GFR: 37.96 mL/min — ABNORMAL LOW (ref >60.00)
Glucose, Bld: 84 mg/dL (ref 70–99)
Potassium: 3.9 mEq/L (ref 3.5–5.1)
Sodium: 141 mEq/L (ref 135–145)
Total Bilirubin: 0.7 mg/dL (ref 0.2–1.2)
Total Protein: 6.9 g/dL (ref 6.0–8.3)

## 2017-12-19 LAB — MICROALBUMIN / CREATININE URINE RATIO
Creatinine,U: 37.4 mg/dL
Microalb Creat Ratio: 5.3 mg/g (ref 0.0–30.0)
Microalb, Ur: 2 mg/dL — ABNORMAL HIGH (ref 0.0–1.9)

## 2017-12-19 LAB — LIPID PANEL
Cholesterol: 137 mg/dL (ref 0–200)
HDL: 43.7 mg/dL (ref >39.00)
LDL Cholesterol: 61 mg/dL (ref 0–99)
NonHDL: 93.36
Total CHOL/HDL Ratio: 3
Triglycerides: 163 mg/dL — ABNORMAL HIGH (ref 0.0–149.0)
VLDL: 32.6 mg/dL (ref 0.0–40.0)

## 2017-12-19 LAB — HEMOGLOBIN A1C: Hgb A1c MFr Bld: 5.7 % (ref 4.6–6.5)

## 2017-12-19 MED ORDER — CARVEDILOL 6.25 MG PO TABS: 6.2500 mg | ORAL_TABLET | Freq: Two times a day (BID) | ORAL | 3 refills | Status: DC

## 2017-12-19 NOTE — Patient Instructions (Signed)

## 2017-12-19 NOTE — Assessment & Plan Note (Signed)
Per endo °

## 2017-12-19 NOTE — Patient Instructions (Signed)
Continue clopidogrel 75 mg daily  and lipitor  for secondary stroke prevention  Continue to follow up with PCP regarding cholesterol and blood pressure management   Follow up with endocrinologist as scheduled for diabetic management   Continue home exercises for continued speech difficulties - if additional therapy is needed after next week, let us know and we can place order.   Continue to stay active and maintain a healthy diet  Continue to monitor blood pressure at home  Maintain strict control of hypertension with blood pressure goal below 130/90, diabetes with hemoglobin A1c goal below 6.5% and cholesterol with LDL cholesterol (bad cholesterol) goal below 70 mg/dL. I also advised the patient to eat a healthy diet with plenty of whole grains, cereals, fruits and vegetables, exercise regularly and maintain ideal body weight.  Followup in the future with me in 6 months or call earlier if needed       Thank you for coming to see [place] at [facility]. I hope we have been able to provide you high quality care today.  You may receive a patient satisfaction survey over the next few weeks. We would appreciate your feedback and comments so that we may continue to improve ourselves and the health of our patients.

## 2017-12-19 NOTE — Assessment & Plan Note (Signed)
Tolerating statin, encouraged heart healthy diet, avoid trans fats, minimize simple carbs and saturated fats. Increase exercise as tolerated 

## 2017-12-19 NOTE — Assessment & Plan Note (Signed)
Slightly worse due to mult deaths in family but she is doing ok now con't meds

## 2017-12-19 NOTE — Progress Notes (Signed)
Patient ID: Erin [name], female    DOB: 04-25-1945  Age: 72 y.o. MRN: [id number]    Subjective:  Subjective  HPI Erin [name] presents for f/u dm, chol and bp.   No complaints.   Her daughter is with her today  Review of Systems  Constitutional: Negative for chills and fever.  HENT: Negative for congestion and hearing loss.   Eyes: Negative for discharge.  Respiratory: Negative for cough and shortness of breath.   Cardiovascular: Negative for chest pain, palpitations and leg swelling.  Gastrointestinal: Negative for abdominal pain, blood in stool, constipation, diarrhea, nausea and vomiting.  Genitourinary: Negative for dysuria, frequency, hematuria and urgency.  Musculoskeletal: Negative for back pain and myalgias.  Skin: Negative for rash.  Allergic/Immunologic: Negative for environmental allergies.  Neurological: Negative for dizziness, weakness and headaches.  Hematological: Does not bruise/bleed easily.  Psychiatric/Behavioral: Negative for suicidal ideas. The patient is not nervous/anxious.     History Past Medical History:  Diagnosis Date  . Asthma   . CHF (congestive heart failure) [facility])    hospital 11/2013-- [place]  . Chronic kidney disease   . Depression   . Diabetes mellitus ([place])   . GERD (gastroesophageal reflux disease)   . Hyperlipidemia   . Hypertension   . Stroke [facility])     She has a past surgical history that includes Cholecystectomy (2002); Tubal ligation; Tonsillectomy; Spine surgery (aug 2015); LOOP RECORDER INSERTION (N/A, 08/01/2017); and TEE without cardioversion (N/A, 08/01/2017).   Her family history includes Alzheimer's disease in her mother; Arthritis in her brother; Asthma in her father; Cancer (age of onset: 44) in her father; Heart disease in her father, maternal uncle, maternal uncle, and maternal uncle; Heart disease (age of onset: 28) in her sister; Hyperlipidemia in her mother; Hypertension in her father and mother; Pulmonary  fibrosis in her sister; Stomach cancer in her paternal grandfather; Stroke in her father and maternal uncle; Uterine cancer in her paternal grandmother.She reports that she quit smoking about 33 years ago. Her smoking use included cigarettes. She has a 30.00 pack-year smoking history. She has never used smokeless tobacco. She reports that she does not drink alcohol or use drugs.  Current Outpatient Medications on File Prior to Visit  Medication Sig Dispense Refill  . Acetaminophen (TYLENOL 8 HOUR PO) Take 500 mg by mouth.    Marland Kitchen albuterol (PROVENTIL) (2.5 MG/3ML) 0.083% nebulizer solution Take 3 mLs (2.5 mg total) by nebulization every 6 (six) hours as needed for wheezing or shortness of breath. 75 mL 12  . albuterol (VENTOLIN HFA) 108 (90 Base) MCG/ACT inhaler Inhale 2 puffs into the lungs every 4 (four) hours as needed for wheezing or shortness of breath.     . allopurinol (ZYLOPRIM) 100 MG tablet TAKE 1 TABLET BY MOUTH EVERY DAY 90 tablet 1  . atorvastatin (LIPITOR) 40 MG tablet Take 1 tablet (40 mg total) by mouth at bedtime. 90 tablet 0  . busPIRone (BUSPAR) 15 MG tablet Take 1 tablet (15 mg total) by mouth 3 (three) times daily. (Patient taking differently: Take 15 mg by mouth 2 (two) times daily. ) 90 tablet 1  . busPIRone (BUSPAR) 15 MG tablet TAKE 1 TABLET BY MOUTH THREE TIMES A DAY 90 tablet 1  . clopidogrel (PLAVIX) 75 MG tablet TAKE 1 TABLET BY MOUTH EVERY DAY 90 tablet 1  . ergocalciferol (VITAMIN D2) 50000 UNITS capsule Take 50,000 Units by mouth once a week. Wednesday    . fenofibrate 160 MG tablet  TAKE 1 TABLET BY MOUTH EVERY DAY 90 tablet 2  . ferrous sulfate 325 (65 FE) MG tablet Take 325 mg by mouth.    . Fluticasone-Salmeterol (ADVAIR) 250-50 MCG/DOSE AEPB Inhale 1 puff into the lungs 2 (two) times daily.    Marland Kitchen glucose blood (ONE TOUCH ULTRA TEST) test strip USE TO CHECK BLOOD SUGAR ONCE A DAY. DX:E11.22 100 each 1  . glucose blood test strip Use 3x a day - One TOUch Ultra 300 each  3  . insulin aspart (NOVOLOG FLEXPEN) 100 UNIT/ML FlexPen Inject 8-10 units 3x a day before meals (Patient taking differently: Inject 8-10 Units into the skin 3 (three) times daily with meals. Inject 8-10 units 3x a day before meals) 45 mL 3  . Insulin Glargine (BASAGLAR KWIKPEN) 100 UNIT/ML SOPN Inject 0.3 mLs (30 Units total) into the skin at bedtime. 15 pen 3  . Insulin Glargine (BASAGLAR KWIKPEN) 100 UNIT/ML SOPN INJECT 0.4 MLS (40 UNITS TOTAL) INTO THE SKIN AT BEDTIME. 15 pen 3  . Insulin Pen Needle 32G X 4 MM MISC Use 4x a day 300 each 3  . pantoprazole (PROTONIX) 40 MG tablet TAKE 1 TABLET BY MOUTH EVERY DAY 90 tablet 1  . pramipexole (MIRAPEX) 1 MG tablet TAKE 1 TABLET (1 MG TOTAL) BY MOUTH AT BEDTIME. 90 tablet 0  . sertraline (ZOLOFT) 50 MG tablet TAKE 1.5 TABLETS (75 MG TOTAL) BY MOUTH DAILY. 45 tablet 0  . torsemide (DEMADEX) 20 MG tablet TAKE FOUR TABLETS BY MOUTH TWICE DAILY     No current facility-administered medications on file prior to visit.      Objective:  Objective  Physical Exam  Constitutional: She is oriented to person, place, and time. She appears well-developed and well-nourished.  HENT:  Head: Normocephalic and atraumatic.  Eyes: Conjunctivae and EOM are normal.  Neck: Normal range of motion. Neck supple. No JVD present. Carotid bruit is not present. No thyromegaly present.  Cardiovascular: Normal rate, regular rhythm and normal heart sounds.  No murmur heard. Pulmonary/Chest: Effort normal and breath sounds normal. No respiratory distress. She has no wheezes. She has no rales. She exhibits no tenderness.  Musculoskeletal: She exhibits no edema.  Neurological: She is alert and oriented to person, place, and time.  Psychiatric: She has a normal mood and affect.  Nursing note and vitals reviewed.  BP (!) 108/42   Pulse 62   Temp 97.7 F (36.5 C) (Oral)   Resp 16   Ht 5\' 4"  (1.626 m)   Wt 188 lb 6.4 oz (85.5 kg)   SpO2 100%   BMI 32.34 kg/m  Wt Readings  from Last 3 Encounters:  12/19/17 188 lb 6.4 oz (85.5 kg)  09/15/17 209 lb (94.8 kg)  08/19/17 216 lb 3.2 oz (98.1 kg)     Lab Results  Component Value Date   WBC 16.2 (H) 10/14/2016   HGB 10.4 (L) 10/14/2016   HCT 32.0 (L) 10/14/2016   PLT 72.0 (L) 10/14/2016   GLUCOSE 101 (H) 07/12/2017   CHOL 125 07/12/2017   TRIG 257.0 (H) 07/12/2017   HDL 38.00 (L) 07/12/2017   LDLDIRECT 54.0 07/12/2017   LDLCALC 33 10/18/2016   ALT 26 07/12/2017   ALT 26 07/12/2017   AST 39 (H) 07/12/2017   AST 39 (H) 07/12/2017   NA 139 07/12/2017   K 3.6 07/12/2017   CL 101 07/12/2017   CREATININE 1.35 (H) 07/12/2017   BUN 39 (H) 07/12/2017   CO2 28 07/12/2017  TSH 2.29 05/17/2011   HGBA1C 7.2 (H) 07/12/2017   MICROALBUR 2.7 (H) 01/06/2016    No results found.   Assessment & Plan:  Plan  I have discontinued Brighid B. Mozley's carvedilol. I am also having her start on carvedilol. Additionally, I am having her maintain her ergocalciferol, albuterol, Fluticasone-Salmeterol, torsemide, ferrous sulfate, albuterol, Acetaminophen (TYLENOL 8 HOUR PO), insulin aspart, allopurinol, clopidogrel, BASAGLAR KWIKPEN, glucose blood, Insulin Pen Needle, busPIRone, pantoprazole, BASAGLAR KWIKPEN, fenofibrate, sertraline, busPIRone, atorvastatin, glucose blood, and pramipexole.  Meds ordered this encounter  Medications  . carvedilol (COREG) 6.25 MG tablet    Sig: Take 1 tablet (6.25 mg total) by mouth 2 (two) times daily with a meal.    Dispense:  60 tablet    Refill:  3    Problem List Items Addressed This Visit      Unprioritized   Anxiety    Slightly worse due to mult deaths in family but she is doing ok now con't meds      CHF (congestive heart failure) (HCC)    Stable  bp running low-- dec coreg dose       Relevant Medications   carvedilol (COREG) 6.25 MG tablet   DM (diabetes mellitus) type II uncontrolled, periph vascular disorder (HCC)    Per endo       Relevant Medications    carvedilol (COREG) 6.25 MG tablet   Essential hypertension - Primary    Running low--- dec coreg  rto 3 months      Relevant Medications   carvedilol (COREG) 6.25 MG tablet   Other Relevant Orders   Comprehensive metabolic panel   Lipid panel   Microalbumin / creatinine urine ratio   Hyperlipidemia    Tolerating statin, encouraged heart healthy diet, avoid trans fats, minimize simple carbs and saturated fats. Increase exercise as tolerated      Relevant Medications   carvedilol (COREG) 6.25 MG tablet    Other Visit Diagnoses    Hyperlipidemia associated with type 2 diabetes mellitus (HCC)       Relevant Medications   carvedilol (COREG) 6.25 MG tablet   Other Relevant Orders   Comprehensive metabolic panel   Lipid panel   Microalbumin / creatinine urine ratio   Controlled type 2 diabetes mellitus with diabetic neuropathy, with long-term current use of insulin (HCC)       Relevant Orders   Hemoglobin A1c   Flu vaccine need       Relevant Orders   Flu vaccine HIGH DOSE PF (Fluzone High Dose) (Completed)      Follow-up: Return in about 6 months (around 06/19/2018), or if symptoms worsen or fail to improve, for hypertension, hyperlipidemia.  Ann Held, DO

## 2017-12-19 NOTE — Assessment & Plan Note (Signed)
Running low--- dec coreg  rto 3 months

## 2017-12-19 NOTE — Progress Notes (Signed)
[facility] [address] [place]. [place] [address] [phone]       OFFICE FOLLOW UP NOTE  Erin [name] Date of Birth:  01-03-1946 Medical Record Number:  [id number]   Referring MD: Martie Lee Reason for Referral:  Stroke  Chief Complaint  Patient presents with  . Follow-up    6 month follow up. Patient is accomplanied by her daughter. No new changes or concerns at this time.      HPI: Ms. Sabra Heck is a pleasant 72 year old Caucasian lady who is accompanied today by her daughter who provides most of the history. I have also reviewed patient's recent hospitalization records at [facility] as well as [facility] in care everywhere. I was unable to locate actual imaging films . She developed sudden onset of aphasia and right hemiplegia and was taken to [facility] CT scan of the head showed slight hemorrhagic transformation into large left MCA infarct. NIH stroke scale was 14. She was outside the time window for thrombolysis and was not felt to be candidate for mechanical thrombectomy. She was admitted at [facility] where carotid ultrasound and transcranial Doppler studies were obtained which were unremarkable. Transthoracic echo showed ejection fraction of 65% with some mild diastolic dysfunction but no evidence of patent foramen ovale or cardiac source of embolism. Telemetry monitoring showed normal sinus rhythm. Hemoglobin A1c was elevated at 11.8 and LDL cholesterol was 53 mg percent. She was seen by physical occupational speech therapy and transferred for ongoing rehabilitation needs to a skilled nursing facility and [place] county.   05/16/17 visit PS: She is up in improvement in her right-sided strength and is able to walk with a cane and can ambulate even up to 200 feet. She does get short of breath due to her CHF and that is more of limiting factor in a walking. Her speech and  language have improved but she still has significant trouble with understanding and speaking. The patient is tolerating Plavix well without bleeding or bruising. Her blood pressure is usually well controlled . She is getting physical occupational and speech therapy. Family is planning to bring her home soon and she will live with her son initially at least until she recovers and is able to live by herself.  06/14/17 visit: Patient returns today for 4-week follow-up.  She is accompanied today by her 2 daughters.  She continues to have home PT/OT/SLP twice a week along with a home health care nurse 2 times a week.  Continues to take Plavix without increase in bleeding or bruising.  Continues to take Lipitor without side effects of myalgias.  Daughter concerned about recent blood pressure that ranges from systolics 998P-382N.  Blood pressure was checked manually at today's appointment at 140/68.  Daughter questioning BP parameters and was advised systolics should remain between 130-140 from a neurological standpoint.  Daughter will attempt to have nephrologist appointment sooner for possible blood pressure medication adjustments. Most recent A1c 11.2 on 04/27/2017.  Patient will be following up with endocrinologist.  Patient's daughter is also scheduling cardiologist appointment.  Discussed TEE procedure with possible loop recorder placement as this was unable to be done at previous appointment due to patient living at skilled nursing facility.  Per daughter, patient does have mild difficulty with short-term memory but has no other memory complaints.  Patient denies increase or new stroke/TIA symptoms.  Interval history 12/19/2017: Patient returns today for follow-up appointment and is  accompanied by her daughter.  Since prior visit, patient underwent TEE on 08/01/2017 which showed EF of 55 to 60% without cardiac source of embolus or PFO therefore loop recorder was placed.  Loop recorder has not shown atrial  fibrillation thus far.  She has completed home therapies and is currently participating in outpatient speech therapy for continued global aphasia.  Daughter was told that she will have one additional speech therapy session and then will be completed.  She was under the impression that patient should be having 8 sessions total but has so far only had 5.  She is unsure if this is due to insurance reasons or if therapist believes that she has possibly plateaued.  Daughter has seen some progress in her speech but feels she no longer has right-sided weakness.  He continues to take Plavix without side effects of bleeding or bruising.  Continues to take Lipitor without side effects myalgias.  Blood pressure today 104/55 and his patient saw PCP prior to this appointment, antihypertensive medications adjusted.  She also had lipid panel and A1c checked today.  Does monitor glucose levels at home and typically run 100-1 45.  She does have an appointment with endocrinology this Friday for continued diabetic management.  She continues to live with her son but is able to complete all ADLs independently.  She has also lost weight and at today's appointment 190 lbs (06/2017 visit 235 lbs) as she has been eating healthier and staying active.  Denies new or worsening stroke/TIA symptoms.   ROS:  14 system review of systems is positive for speech difficulty and all other systems negative   PMH:  Past Medical History:  Diagnosis Date  . Asthma   . CHF (congestive heart failure) [facility])    hospital 11/2013-- [place]  . Chronic kidney disease   . Depression   . Diabetes mellitus ([place])   . GERD (gastroesophageal reflux disease)   . Hyperlipidemia   . Hypertension   . Stroke [facility])     Social History:  Social History   Socioeconomic History  . Marital status: Widowed    Spouse name: Not on file  . Number of children: 4  . Years of education: Not on file  . Highest education level: Not on file  Occupational  History  . 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 Financial resource strain: Not on file  . Food insecurity:    Worry: Not on file    Inability: Not on file  . Transportation needs:    Medical: Not on file    Non-medical: Not on file  Tobacco Use  . Smoking status: Former Smoker    Packs/day: 1.50    Years: 20.00    Pack years: 30.00    Types: Cigarettes    Last attempt to quit: 08/02/1984    Years since quitting: 33.4  . Smokeless tobacco: Never Used  Substance and Sexual Activity  . Alcohol use: No  . Drug use: No  . Sexual activity: Not Currently    Partners: Male  Lifestyle  . Physical activity:    Days per week: Not on file    Minutes per session: Not on file  . Stress: Not on file  Relationships  . Social connections:    Talks on phone: Not on file    Gets together: Not on file    Attends religious service: Not on file    Active member of club or  organization: Not on file    Attends meetings of clubs or organizations: Not on file    Relationship status: Not on file  . Intimate partner violence:    Fear of current or ex partner: Not on file    Emotionally abused: Not on file    Physically abused: Not on file    Forced sexual activity: Not on file  Other Topics Concern  . Not on file  Social History Narrative   NO REG EXERCISE   Caffeine use: daily    Medications:   Current Outpatient Medications on File Prior to Visit  Medication Sig Dispense Refill  . Acetaminophen (TYLENOL 8 HOUR PO) Take 500 mg by mouth.    Marland Kitchen albuterol (PROVENTIL) (2.5 MG/3ML) 0.083% nebulizer solution Take 3 mLs (2.5 mg total) by nebulization every 6 (six) hours as needed for wheezing or shortness of breath. 75 mL 12  . albuterol (VENTOLIN HFA) 108 (90 Base) MCG/ACT inhaler Inhale 2 puffs into the lungs every 4 (four) hours as needed for wheezing or shortness of breath.     . allopurinol (ZYLOPRIM) 100 MG tablet TAKE 1 TABLET BY MOUTH EVERY DAY 90  tablet 1  . atorvastatin (LIPITOR) 40 MG tablet Take 1 tablet (40 mg total) by mouth at bedtime. 90 tablet 0  . busPIRone (BUSPAR) 15 MG tablet Take 1 tablet (15 mg total) by mouth 3 (three) times daily. (Patient taking differently: Take 15 mg by mouth 2 (two) times daily. ) 90 tablet 1  . busPIRone (BUSPAR) 15 MG tablet TAKE 1 TABLET BY MOUTH THREE TIMES A DAY 90 tablet 1  . busPIRone (BUSPAR) 15 MG tablet Take 15 mg by mouth 2 (two) times daily.    . clopidogrel (PLAVIX) 75 MG tablet TAKE 1 TABLET BY MOUTH EVERY DAY 90 tablet 1  . ergocalciferol (VITAMIN D2) 50000 UNITS capsule Take 50,000 Units by mouth once a week. Wednesday    . fenofibrate 160 MG tablet TAKE 1 TABLET BY MOUTH EVERY DAY 90 tablet 2  . ferrous sulfate 325 (65 FE) MG tablet Take 325 mg by mouth.    . Fluticasone-Salmeterol (ADVAIR) 250-50 MCG/DOSE AEPB Inhale 1 puff into the lungs 2 (two) times daily.    Marland Kitchen glucose blood (ONE TOUCH ULTRA TEST) test strip USE TO CHECK BLOOD SUGAR ONCE A DAY. DX:E11.22 100 each 1  . glucose blood test strip Use 3x a day - One TOUch Ultra 300 each 3  . insulin aspart (NOVOLOG FLEXPEN) 100 UNIT/ML FlexPen Inject 8-10 units 3x a day before meals (Patient taking differently: Inject 8-10 Units into the skin 3 (three) times daily with meals. Inject 8-10 units 3x a day before meals) 45 mL 3  . Insulin Glargine (BASAGLAR KWIKPEN) 100 UNIT/ML SOPN Inject 0.3 mLs (30 Units total) into the skin at bedtime. 15 pen 3  . Insulin Glargine (BASAGLAR KWIKPEN) 100 UNIT/ML SOPN INJECT 0.4 MLS (40 UNITS TOTAL) INTO THE SKIN AT BEDTIME. 15 pen 3  . Insulin Pen Needle 32G X 4 MM MISC Use 4x a day 300 each 3  . pantoprazole (PROTONIX) 40 MG tablet TAKE 1 TABLET BY MOUTH EVERY DAY 90 tablet 1  . pramipexole (MIRAPEX) 1 MG tablet TAKE 1 TABLET (1 MG TOTAL) BY MOUTH AT BEDTIME. 90 tablet 0  . sertraline (ZOLOFT) 50 MG tablet TAKE 1.5 TABLETS (75 MG TOTAL) BY MOUTH DAILY. 45 tablet 0  . torsemide (DEMADEX) 20 MG tablet TAKE  FOUR TABLETS BY MOUTH TWICE DAILY  No current facility-administered medications on file prior to visit.     Allergies:  No Known Allergies  BP (!) 104/55   Pulse 71   Ht 5\' 4"  (1.626 m)   Wt 190 lb 9.6 oz (86.5 kg)   BMI 32.72 kg/m    Physical Exam General: Obese elderly Caucasian lady seated, in no evident distress Head: head normocephalic and atraumatic.   Neck: supple with no carotid or supraclavicular bruits Cardiovascular: regular rate and rhythm, no murmurs Musculoskeletal: no deformity Skin:  no rash/petichiae Vascular:  Normal pulses all extremities  Neurologic Exam Mental Status: Awake and fully alert. Receptive < expressive aphasia with nonfluent speech with paraphasic errors and some word hesitancy. Unable to name objects. Able to repeat words with prompting.  Cranial Nerves: Fundoscopic exam reveals sharp disc margins. Pupils equal, briskly reactive to light. Extraocular movements full without nystagmus. Hearing intact. Facial sensation intact. Face, tongue, palate moves normally and symmetrically.   Motor: Normal bulk and tone. Normal strength in all tested extremity muscles  Sensory.: intact to touch , pinprick , position and vibratory sensation.  Coordination: Rapid alternating movements normal in all extremities. Finger-to-nose and heel-to-shin performed accurately bilaterally. Gait and Station: Arises from chair without difficulty. Stance is normal. Gait demonstrates  slight wide-based with favoring of the right leg. Uses a cane. Unable to do tandem walking .  Reflexes: 1+ and symmetric. Toes downgoing.     IMAGING/LABS:  ECHO TEE 08/01/2017 Study Conclusions - Left ventricle: The cavity size was normal. Wall thickness was   normal. Systolic function was normal. The estimated ejection   fraction was in the range of 55% to 60%. Wall motion was normal;   there were no regional wall motion abnormalities. Features are   consistent with a pseudonormal left  ventricular filling pattern,   with concomitant abnormal relaxation and increased filling   pressure (grade 2 diastolic dysfunction). - Left atrium: The atrium was mildly dilated. No evidence of   thrombus in the atrial cavity or appendage. No spontaneous echo   contrast was observed. - Right atrium: No evidence of thrombus in the atrial cavity or   appendage. - Atrial septum: There was increased thickness of the septum,   consistent with lipomatous hypertrophy. No defect or patent   foramen ovale was identified. Echo contrast study showed no   right-to-left atrial level shunt, at baseline or with   provocation. - Pulmonary arteries: Systolic pressure was mildly increased. PA   peak pressure: 37 mm Hg (S).  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  Underwent TEE which was negative for PFO or cardiac source of embolus therefore loop recorder placed on 08/01/2017.  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   PLAN: -Continue clopidogrel 75 mg daily  and lipitor 40mg   for secondary stroke prevention -f/u with PCP for HLD and HTN management -f/u with endocrinologist for diabetic management -Advised to complete speech therapy sessions along with continuing exercises at home.  Daughter does not feel as though patient has plateaued and will talk to speech therapist at next appointment.  Daughter requesting that if this therapist feels as though she has plateaued, if patient can have a second opinion by different speech therapist.  Advised daughter  to call us if additional referral is needed. -Advised to continue to stay active and maintain a healthy diet -Continue to  monitor blood pressure at home due to change in HTN management -Maintain strict control of hypertension with blood pressure goal below 130/90, diabetes with hemoglobin A1c goal below 6.5% and cholesterol with LDL cholesterol (bad cholesterol) goal below 70 mg/dL. I also advised the patient to eat a healthy diet with plenty of whole grains, cereals, fruits and vegetables, exercise regularly and maintain ideal body weight.  -Followup in the future with me in 6 months   Greater than 50% time during this 25 minute consultation visit was spent on counseling and coordination of care about HTN, HLD, and DM (risk factors), discussion about risk benefit of anticoagulation and answering questions. Discussed importance of TEE study and possible loop recorder placement. Education regarding atrial fibrillation and increased risk for stroke.   Venancio Poisson, AGNP-BC  [facility] [address] [place], [place] [address]  Phone [phone] Fax [phone]

## 2017-12-19 NOTE — Assessment & Plan Note (Signed)
Stable  bp running low-- dec coreg dose

## 2017-12-20 NOTE — Progress Notes (Signed)
I agree with the above plan 

## 2017-12-20 NOTE — Addendum Note (Signed)
Addended by: Kem Boroughs D on: 12/20/2017 01:39 PM   Modules accepted: Orders

## 2017-12-23 ENCOUNTER — Encounter: Payer: Self-pay | Admitting: Internal Medicine

## 2017-12-23 ENCOUNTER — Other Ambulatory Visit: Payer: Self-pay | Admitting: Family Medicine

## 2017-12-23 ENCOUNTER — Ambulatory Visit (INDEPENDENT_AMBULATORY_CARE_PROVIDER_SITE_OTHER): Payer: Medicare Other | Admitting: Internal Medicine

## 2017-12-23 VITALS — BP 134/56 | HR 74 | Ht 64.0 in | Wt 188.0 lb

## 2017-12-23 DIAGNOSIS — I639 Cerebral infarction, unspecified: Secondary | ICD-10-CM

## 2017-12-23 DIAGNOSIS — E1165 Type 2 diabetes mellitus with hyperglycemia: Secondary | ICD-10-CM | POA: Diagnosis not present

## 2017-12-23 DIAGNOSIS — E1151 Type 2 diabetes mellitus with diabetic peripheral angiopathy without gangrene: Secondary | ICD-10-CM | POA: Diagnosis not present

## 2017-12-23 DIAGNOSIS — E785 Hyperlipidemia, unspecified: Secondary | ICD-10-CM

## 2017-12-23 DIAGNOSIS — IMO0002 Reserved for concepts with insufficient information to code with codable children: Secondary | ICD-10-CM

## 2017-12-23 MED ORDER — BASAGLAR KWIKPEN 100 UNIT/ML ~~LOC~~ SOPN: 16.0000 [IU] | PEN_INJECTOR | Freq: Every day | SUBCUTANEOUS | 3 refills | Status: DC

## 2017-12-23 MED ORDER — INSULIN ASPART 100 UNIT/ML FLEXPEN: PEN_INJECTOR | SUBCUTANEOUS | 3 refills | Status: DC

## 2017-12-23 NOTE — Progress Notes (Signed)
Patient ID: Erin [name], female   DOB: [birth year], 72 y.o.   MRN: [id number]  HPI: Erin [name] is a 72 y.o.-year-old female, returning for f/u for DM2, dx 2013, insulin-dependent, uncontrolled, with complications (CKD). Last visit 4 months ago.  She is here with her daughter who offers almost all of the history as patient is nonverbal.  She had a stroke in 04/2017.  This affected her speech only.  She continues to lose weight and her diabetes improved significantly.  She is in a wheelchair.  She lives with her son.  She recently had a low blood sugar at 40 in the morning, however, no other blood sugars there were lower than 90s.  Last hemoglobin A1c was excellent earlier this month: Lab Results  Component Value Date   HGBA1C 5.7 12/19/2017   HGBA1C 7.2 (H) 07/12/2017   HGBA1C 10.6 (H) 01/25/2017   HGBA1C 8.4 11/08/2016   HGBA1C 12.4 07/23/2016   HGBA1C 11.2 Repeated and verified X2. (H) 01/06/2016   HGBA1C 8.4 05/01/2015   HGBA1C 9.0 (H) 12/10/2014   HGBA1C 5.8 01/11/2014   HGBA1C 6.8 (H) 08/02/2013  04/27/2017: HbA1c 11.2%  Pt is now on: - Basaglar 40 >> 30 >> 20 units at bedtime  - NovoLog 8-10 >> 5 units before each meal If sugars 60-80, give only 50% of the dose If sugars <60, hold NovoLog  Pt was checking her sugars 3 X a day - am: 160-184, 206 >> 96-135 >> 40, 96-132, 143 - 2h after b'fast: 300s >> 185-220 >> n/c - before lunch: 187-223, 253 >> 62-123, 156 >> 100-129 - 2h after lunch: 300s-400 >> 207-244 >> n/c - before dinner: 181-258 >> 102-134, 168 >> 109-175, 194 - 2h after dinner:  n/c >> 290s >> n/c - bedtime: 95-126 >> n/c >> 209-243 >> n/c Lowest sugar was 150 >> 161 >> 40 x1 ; she has hypoglycemia awareness in the 70s Highest sugar was 400s >> 300s >> 258 >> 194.  Meals: - Breakfast: eggs + toast; occas. grits - Lunch: sandwich, soup - Dinner: meat + veggies + starch or sandwich - Snacks: grapes, icecream  -+ CKD, last BUN/creatinine:  Lab  Results  Component Value Date   BUN 52 (H) 12/19/2017   CREATININE 1.44 (H) 12/19/2017  04/28/2017: GFR 41 04/27/2017: 34/1.45, GFR 34 11/30/2013 [facility] - Cr 1.8    She is off lisinopril -+ HL; last set of lipids: Lab Results  Component Value Date   CHOL 137 12/19/2017   HDL 43.70 12/19/2017   LDLCALC 61 12/19/2017   LDLDIRECT 54.0 07/12/2017   TRIG 163.0 (H) 12/19/2017   CHOLHDL 3 12/19/2017  On Lipitor 40, fenofibrate 160.  - last eye exam was in 06/2016: No DR she has a history of cataracts with right cataract surgery.  - + numbness and tingling in her big toe but not in feet.Marland Kitchen Has an ingrown toenail.  She also has a history of COPD, depression, vit D def. -latest vitamin D level from 04/27/2017: 19.2  ROS: Constitutional: + weight loss, no fatigue, no subjective hyperthermia, no subjective hypothermia Eyes: no blurry vision, no xerophthalmia ENT: no sore throat, no nodules palpated in throat, no dysphagia, no odynophagia, no hoarseness Cardiovascular: no CP/no SOB/no palpitations/no leg swelling Respiratory: no cough/no SOB/no wheezing Gastrointestinal: no N/no V/no D/no C/no acid reflux Musculoskeletal: no muscle aches/no joint aches Skin: no rashes, no hair loss Neurological: no tremors/+ numbness/+ tingling/no dizziness  I reviewed pt's medications, allergies, PMH, social  hx, family hx, and changes were documented in the history of present illness. Otherwise, unchanged from my initial visit note. Carvedilol dose was halved.   Past Medical History:  Diagnosis Date  . Asthma   . CHF (congestive heart failure) [facility])    hospital 11/2013-- [place]  . Chronic kidney disease   . Depression   . Diabetes mellitus ([place])   . GERD (gastroesophageal reflux disease)   . Hyperlipidemia   . Hypertension   . Stroke [facility])    Past Surgical History:  Procedure Laterality Date  . CHOLECYSTECTOMY  2002  . LOOP RECORDER INSERTION N/A 08/01/2017   Procedure: LOOP  RECORDER INSERTION;  Surgeon: Constance Haw, MD;  Location: [place] [facility];  Service: Cardiovascular;  Laterality: N/A;  . SPINE SURGERY  aug 2015   [facility]  . TEE WITHOUT CARDIOVERSION N/A 08/01/2017   Procedure: TRANSESOPHAGEAL ECHOCARDIOGRAM (TEE);  Surgeon: Sanda Klein, MD;  Location: [facility] END[facility];  Service: Cardiovascular;  Laterality: N/A;  . TONSILLECTOMY    . TUBAL LIGATION     Social History   Socioeconomic History  . Marital status: Widowed    Spouse name: Not on file  . Number of children: 4  . Years of education: Not on file  . Highest education level: Not on file  Occupational History  . 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 Financial resource strain: Not on file  . Food insecurity:    Worry: Not on file    Inability: Not on file  . Transportation needs:    Medical: Not on file    Non-medical: Not on file  Tobacco Use  . Smoking status: Former Smoker    Packs/day: 1.50    Years: 20.00    Pack years: 30.00    Types: Cigarettes    Last attempt to quit: 08/02/1984    Years since quitting: 33.4  . Smokeless tobacco: Never Used  Substance and Sexual Activity  . Alcohol use: No  . Drug use: No  . Sexual activity: Not Currently    Partners: Male  Lifestyle  . Physical activity:    Days per week: Not on file    Minutes per session: Not on file  . Stress: Not on file  Relationships  . Social connections:    Talks on phone: Not on file    Gets together: Not on file    Attends religious service: Not on file    Active member of club or organization: Not on file    Attends meetings of clubs or organizations: Not on file    Relationship status: Not on file  . Intimate partner violence:    Fear of current or ex partner: Not on file    Emotionally abused: Not on file    Physically abused: Not on file    Forced sexual activity: Not on file  Other Topics Concern  . Not on file  Social History  Narrative   NO REG EXERCISE   Caffeine use: daily   Current Outpatient Medications on File Prior to Visit  Medication Sig Dispense Refill  . Acetaminophen (TYLENOL 8 HOUR PO) Take 500 mg by mouth.    Marland Kitchen albuterol (PROVENTIL) (2.5 MG/3ML) 0.083% nebulizer solution Take 3 mLs (2.5 mg total) by nebulization every 6 (six) hours as needed for wheezing or shortness of breath. 75 mL 12  . albuterol (VENTOLIN HFA) 108 (90 Base) MCG/ACT inhaler Inhale 2 puffs into  the lungs every 4 (four) hours as needed for wheezing or shortness of breath.     . allopurinol (ZYLOPRIM) 100 MG tablet TAKE 1 TABLET BY MOUTH EVERY DAY 90 tablet 1  . atorvastatin (LIPITOR) 40 MG tablet Take 1 tablet (40 mg total) by mouth at bedtime. 90 tablet 0  . busPIRone (BUSPAR) 15 MG tablet Take 1 tablet (15 mg total) by mouth 3 (three) times daily. (Patient taking differently: Take 15 mg by mouth 2 (two) times daily. ) 90 tablet 1  . busPIRone (BUSPAR) 15 MG tablet Take 15 mg by mouth 2 (two) times daily.    . carvedilol (COREG) 6.25 MG tablet Take 1 tablet (6.25 mg total) by mouth 2 (two) times daily with a meal. 60 tablet 3  . clopidogrel (PLAVIX) 75 MG tablet TAKE 1 TABLET BY MOUTH EVERY DAY 90 tablet 1  . ergocalciferol (VITAMIN D2) 50000 UNITS capsule Take 50,000 Units by mouth once a week. Wednesday    . fenofibrate 160 MG tablet TAKE 1 TABLET BY MOUTH EVERY DAY 90 tablet 2  . ferrous sulfate 325 (65 FE) MG tablet Take 325 mg by mouth.    . Fluticasone-Salmeterol (ADVAIR) 250-50 MCG/DOSE AEPB Inhale 1 puff into the lungs 2 (two) times daily.    Marland Kitchen glucose blood (ONE TOUCH ULTRA TEST) test strip USE TO CHECK BLOOD SUGAR ONCE A DAY. DX:E11.22 100 each 1  . glucose blood test strip Use 3x a day - One TOUch Ultra 300 each 3  . insulin aspart (NOVOLOG FLEXPEN) 100 UNIT/ML FlexPen Inject 8-10 units 3x a day before meals (Patient taking differently: Inject 8-10 Units into the skin 3 (three) times daily with meals. Inject 8-10 units 3x a  day before meals) 45 mL 3  . Insulin Glargine (BASAGLAR KWIKPEN) 100 UNIT/ML SOPN Inject 0.3 mLs (30 Units total) into the skin at bedtime. 15 pen 3  . Insulin Glargine (BASAGLAR KWIKPEN) 100 UNIT/ML SOPN INJECT 0.4 MLS (40 UNITS TOTAL) INTO THE SKIN AT BEDTIME. 15 pen 3  . Insulin Pen Needle 32G X 4 MM MISC Use 4x a day 300 each 3  . pantoprazole (PROTONIX) 40 MG tablet TAKE 1 TABLET BY MOUTH EVERY DAY 90 tablet 1  . pramipexole (MIRAPEX) 1 MG tablet TAKE 1 TABLET (1 MG TOTAL) BY MOUTH AT BEDTIME. 90 tablet 0  . sertraline (ZOLOFT) 50 MG tablet TAKE 1.5 TABLETS (75 MG TOTAL) BY MOUTH DAILY. 45 tablet 0  . torsemide (DEMADEX) 20 MG tablet TAKE FOUR TABLETS BY MOUTH TWICE DAILY     No current facility-administered medications on file prior to visit.    No Known Allergies Family History  Problem Relation Age of Onset  . Hypertension Mother   . Alzheimer's disease Mother   . Hyperlipidemia Mother   . Heart disease Father        cad  . Asthma Father   . Cancer Father 42       leukemia  . Stroke Father   . Hypertension Father   . Heart disease Sister 52       MI  . Pulmonary fibrosis Sister   . Arthritis Brother   . Heart disease Maternal Uncle   . Stroke Maternal Uncle   . Uterine cancer Paternal Grandmother   . Stomach cancer Paternal Grandfather   . Heart disease Maternal Uncle   . Heart disease Maternal Uncle     PE: BP (!) 134/56   Pulse 74   Ht 5\' 4"  (1.626  m)   Wt 188 lb (85.3 kg)   SpO2 97%   BMI 32.27 kg/m  Body mass index is 32.27 kg/m. Wt Readings from Last 3 Encounters:  12/23/17 188 lb (85.3 kg)  12/19/17 190 lb 9.6 oz (86.5 kg)  12/19/17 188 lb 6.4 oz (85.5 kg)   Constitutional: overweight, in NAD Eyes: PERRLA, EOMI, no exophthalmos ENT: moist mucous membranes, no thyromegaly, no cervical lymphadenopathy Cardiovascular: RRR, No MRG Respiratory: CTA B Gastrointestinal: abdomen soft, NT, ND, BS+ Musculoskeletal: no deformities, strength intact in all  4 Skin: moist, warm, no rashes Neurological: no tremor with outstretched hands, DTR normal in all 4   ASSESSMENT: 1. DM2, insulin-dependent, uncontrolled, with complications - CKD  2. Obesity class 3 BMI Classification:  < 18.5 underweight   18.5-24.9 normal weight   25.0-29.9 overweight   30.0-34.9 class I obesity   35.0-39.9 class II obesity   ? 40.0 class III obesity   3. HL  PLAN:  1. Patient with long-standing, uncontrolled, type 2 diabetes, on basal-bolus insulin regimen, with worsening control of her diabetes around the time of her stroke in 04/2016, but significant improvement since then along with weight loss is approximately 70 pounds.  She lives with her son now and he makes sure that she checks her sugars, takes her insulin, and eats all meals.  We continue to decrease her insulin doses at last visit and even after last visit as her sugars continued to improve. -We reviewed together her latest HbA1c which was excellent, at 5.7% at the beginning of this month -At this visit, daughter describes that she had a low blood sugar episode 3 weeks ago, at 40 in a.m.  It is unclear why she had this, since she had pizza for dinner the night before.  This is the only low blood sugar in her log, all the other ones being higher than 90.  However, I would like to decrease her Basaglar further for now and I hope we can also reduce NovoLog at next visit.  They are aware to let me know if her sugars start decreasing again. - I advised her to:  Patient Instructions  Please decrease: - Basaglar 16 units at bedtime  Please continue: - NovoLog 5 units before each meal If sugars 60-80, give only 50% of the dose If sugars <60, hold NovoLog  Check if Humalog is covered.  Please return in 4 months with your sugar log.   - continue checking sugars at different times of the day - check 3x a day, rotating checks - advised for yearly eye exams >> she is not UTD - She already got the flu  shot for this year. - Return to clinic in 4 mo with sugar log     2. Obesity class 3 -She lost another approximately 20 pounds since last visit, almost 70 lbs lost since 01/2017!  3. HL - Reviewed latest lipid panel from 12/2017: LDL excellent, triglycerides slightly high Lab Results  Component Value Date   CHOL 137 12/19/2017   HDL 43.70 12/19/2017   LDLCALC 61 12/19/2017   LDLDIRECT 54.0 07/12/2017   TRIG 163.0 (H) 12/19/2017   CHOLHDL 3 12/19/2017  - Continues the statin and fenofibrate without side effects.  Philemon Kingdom, MD PhD [facility]

## 2017-12-23 NOTE — Patient Instructions (Addendum)
Please decrease: - Basaglar 16 units at bedtime  Please continue: - NovoLog 5 units before each meal If sugars 60-80, give only 50% of the dose If sugars <60, hold NovoLog  Check if Humalog is covered.  Please return in 4 months with your sugar log.

## 2017-12-26 ENCOUNTER — Encounter: Payer: Self-pay | Admitting: Family Medicine

## 2017-12-26 DIAGNOSIS — Z1231 Encounter for screening mammogram for malignant neoplasm of breast: Secondary | ICD-10-CM

## 2017-12-28 DIAGNOSIS — R4701 Aphasia: Secondary | ICD-10-CM | POA: Diagnosis not present

## 2017-12-30 DIAGNOSIS — N183 Chronic kidney disease, stage 3 (moderate): Secondary | ICD-10-CM | POA: Diagnosis not present

## 2017-12-30 LAB — CUP PACEART REMOTE DEVICE CHECK
Date Time Interrogation Session: 20190905143908
Implantable Pulse Generator Implant Date: 20190429

## 2018-01-04 DIAGNOSIS — M9080 Osteopathy in diseases classified elsewhere, unspecified site: Secondary | ICD-10-CM | POA: Diagnosis not present

## 2018-01-04 DIAGNOSIS — I129 Hypertensive chronic kidney disease with stage 1 through stage 4 chronic kidney disease, or unspecified chronic kidney disease: Secondary | ICD-10-CM | POA: Diagnosis not present

## 2018-01-04 DIAGNOSIS — E559 Vitamin D deficiency, unspecified: Secondary | ICD-10-CM | POA: Diagnosis not present

## 2018-01-04 DIAGNOSIS — E1122 Type 2 diabetes mellitus with diabetic chronic kidney disease: Secondary | ICD-10-CM | POA: Diagnosis not present

## 2018-01-04 DIAGNOSIS — D631 Anemia in chronic kidney disease: Secondary | ICD-10-CM | POA: Diagnosis not present

## 2018-01-04 DIAGNOSIS — N183 Chronic kidney disease, stage 3 (moderate): Secondary | ICD-10-CM | POA: Diagnosis not present

## 2018-01-04 DIAGNOSIS — E889 Metabolic disorder, unspecified: Secondary | ICD-10-CM | POA: Diagnosis not present

## 2018-01-10 ENCOUNTER — Ambulatory Visit (INDEPENDENT_AMBULATORY_CARE_PROVIDER_SITE_OTHER): Payer: Medicare Other | Admitting: *Deleted

## 2018-01-10 DIAGNOSIS — I639 Cerebral infarction, unspecified: Secondary | ICD-10-CM

## 2018-01-11 NOTE — Progress Notes (Signed)
Carelink Summary Report / Loop Recorder 

## 2018-01-25 LAB — CUP PACEART REMOTE DEVICE CHECK
Date Time Interrogation Session: 20191008153918
Implantable Pulse Generator Implant Date: 20190429

## 2018-02-03 ENCOUNTER — Other Ambulatory Visit: Payer: Self-pay | Admitting: Family Medicine

## 2018-02-06 ENCOUNTER — Other Ambulatory Visit: Payer: Self-pay | Admitting: Family Medicine

## 2018-02-13 ENCOUNTER — Telehealth: Payer: Self-pay | Admitting: *Deleted

## 2018-02-13 ENCOUNTER — Ambulatory Visit (INDEPENDENT_AMBULATORY_CARE_PROVIDER_SITE_OTHER): Payer: Medicare Other | Admitting: *Deleted

## 2018-02-13 DIAGNOSIS — I639 Cerebral infarction, unspecified: Secondary | ICD-10-CM

## 2018-02-13 NOTE — Telephone Encounter (Signed)
Received Physician Orders/Plan of Care from [facility]; forwarded to provider/SLS 11/11

## 2018-02-14 DIAGNOSIS — Z961 Presence of intraocular lens: Secondary | ICD-10-CM | POA: Diagnosis not present

## 2018-02-14 DIAGNOSIS — E119 Type 2 diabetes mellitus without complications: Secondary | ICD-10-CM | POA: Diagnosis not present

## 2018-02-14 DIAGNOSIS — H353131 Nonexudative age-related macular degeneration, bilateral, early dry stage: Secondary | ICD-10-CM | POA: Diagnosis not present

## 2018-02-14 NOTE — Progress Notes (Signed)
Carelink Summary Report / Loop Recorder 

## 2018-02-19 ENCOUNTER — Other Ambulatory Visit: Payer: Self-pay | Admitting: Family Medicine

## 2018-02-21 ENCOUNTER — Other Ambulatory Visit: Payer: Self-pay | Admitting: Family Medicine

## 2018-02-24 ENCOUNTER — Other Ambulatory Visit: Payer: Self-pay | Admitting: Family Medicine

## 2018-02-24 DIAGNOSIS — F32A Depression, unspecified: Secondary | ICD-10-CM

## 2018-02-24 DIAGNOSIS — F329 Major depressive disorder, single episode, unspecified: Secondary | ICD-10-CM

## 2018-03-12 ENCOUNTER — Other Ambulatory Visit: Payer: Self-pay | Admitting: Family Medicine

## 2018-03-12 DIAGNOSIS — K219 Gastro-esophageal reflux disease without esophagitis: Secondary | ICD-10-CM

## 2018-03-16 ENCOUNTER — Ambulatory Visit (INDEPENDENT_AMBULATORY_CARE_PROVIDER_SITE_OTHER): Payer: Medicare Other | Admitting: Vascular Surgery

## 2018-03-17 ENCOUNTER — Ambulatory Visit (INDEPENDENT_AMBULATORY_CARE_PROVIDER_SITE_OTHER): Payer: Medicare Other

## 2018-03-17 DIAGNOSIS — I639 Cerebral infarction, unspecified: Secondary | ICD-10-CM | POA: Diagnosis not present

## 2018-03-20 NOTE — Progress Notes (Signed)
Carelink Summary Report / Loop Recorder 

## 2018-03-24 ENCOUNTER — Other Ambulatory Visit: Payer: Self-pay | Admitting: Family Medicine

## 2018-03-24 DIAGNOSIS — F32A Depression, unspecified: Secondary | ICD-10-CM

## 2018-03-24 DIAGNOSIS — F329 Major depressive disorder, single episode, unspecified: Secondary | ICD-10-CM

## 2018-04-04 LAB — CUP PACEART REMOTE DEVICE CHECK
Date Time Interrogation Session: 20191110153752
Implantable Pulse Generator Implant Date: 20190429

## 2018-04-12 ENCOUNTER — Other Ambulatory Visit: Payer: Self-pay | Admitting: Family Medicine

## 2018-04-12 DIAGNOSIS — I10 Essential (primary) hypertension: Secondary | ICD-10-CM

## 2018-04-16 ENCOUNTER — Other Ambulatory Visit: Payer: Self-pay | Admitting: Family Medicine

## 2018-04-16 DIAGNOSIS — F32A Depression, unspecified: Secondary | ICD-10-CM

## 2018-04-16 DIAGNOSIS — F329 Major depressive disorder, single episode, unspecified: Secondary | ICD-10-CM

## 2018-04-17 ENCOUNTER — Encounter: Payer: Self-pay | Admitting: Internal Medicine

## 2018-04-17 ENCOUNTER — Ambulatory Visit (INDEPENDENT_AMBULATORY_CARE_PROVIDER_SITE_OTHER): Payer: Medicare Other | Admitting: Internal Medicine

## 2018-04-17 VITALS — BP 130/70 | HR 71 | Ht 64.0 in | Wt 188.0 lb

## 2018-04-17 DIAGNOSIS — E785 Hyperlipidemia, unspecified: Secondary | ICD-10-CM

## 2018-04-17 DIAGNOSIS — E1121 Type 2 diabetes mellitus with diabetic nephropathy: Secondary | ICD-10-CM | POA: Diagnosis not present

## 2018-04-17 DIAGNOSIS — Z794 Long term (current) use of insulin: Secondary | ICD-10-CM | POA: Diagnosis not present

## 2018-04-17 LAB — POCT GLYCOSYLATED HEMOGLOBIN (HGB A1C): Hemoglobin A1C: 5.1 % (ref 4.0–5.6)

## 2018-04-17 MED ORDER — GLUCOSE BLOOD VI STRP: ORAL_STRIP | 3 refills | Status: DC

## 2018-04-17 NOTE — Patient Instructions (Addendum)
Please continue: - Basaglar 16 units at bedtime  Stop Novolog but keep it at hand for large meals.  Please let me know if sugars before meals are consistently >140 and after meals >180.  Please return in 4 months with your sugar log.

## 2018-04-17 NOTE — Addendum Note (Signed)
Addended by: Cardell Peach I on: 04/17/2018 11:35 AM   Modules accepted: Orders

## 2018-04-17 NOTE — Progress Notes (Signed)
Patient ID: Erin [name], female   DOB: 1946-03-05, 73 y.o.   MRN: [id number]  HPI: Erin [name] is a 73 y.o.-year-old female, returning for f/u for DM2, dx 2013, insulin-dependent, uncontrolled, with complications (CKD, cerebrovascular disease-s/p stroke 04/2017). Last visit 4 months ago.  She is here with her daughter who offers almost all of the history as patient is nonverbal.  She is now back living at her home, previously living with her son. Spends 2 nights a week at her son's house. She is now administering her medicines by herself, checking them off a list prepared by the son. Not missing meds.  Latest HbA1c was: Lab Results  Component Value Date   HGBA1C 5.7 12/19/2017   HGBA1C 7.2 (H) 07/12/2017   HGBA1C 10.6 (H) 01/25/2017   HGBA1C 8.4 11/08/2016   HGBA1C 12.4 07/23/2016   HGBA1C 11.2 Repeated and verified X2. (H) 01/06/2016   HGBA1C 8.4 05/01/2015   HGBA1C 9.0 (H) 12/10/2014   HGBA1C 5.8 01/11/2014   HGBA1C 6.8 (H) 08/02/2013  04/27/2017: HbA1c 11.2%  Pt is now on: - Basaglar 40 >> 30 >> 20 >> 16 units at bedtime  - NovoLog 8-10 >> 5 units before each meal If sugars 60-80, give only 50% of the dose If sugars <60, hold NovoLog  Pt was checking her sugars 3 times a day: - am: 40, 96-132, 143 >> 72, 80-126, 131 - 2h after b'fast: 300s >> 185-220 >> n/c - before lunch: 100-129 >> 65, 78-132, 137 - 2h after lunch: 300s-400 >> 207-244 >> n/c - before dinner: 109-175, 194 >> 87-150, 174, 180 - 2h after dinner:  n/c >> 290s >> n/c - bedtime: 42683-419 >> n/c >> 130-160, 179 Lowest sugar was 150 >> 161 >> 40 x1 >> 65; she has hypoglycemia awareness in the 70s. Highest sugar was 400s >> 300s >> 258 >> 194 >> 180.  Meals: - Breakfast: eggs + toast; occas. grits - Lunch: sandwich, soup - Dinner: meat + veggies + starch or sandwich - Snacks: grapes, icecream  -+ CKD.  Last BUN/creatinine:  Lab Results  Component Value Date   BUN 52 (H) 12/19/2017   CREATININE  1.44 (H) 12/19/2017  04/28/2017: GFR 41 04/27/2017: 34/1.45, GFR 34 11/30/2013 [facility] - Cr 1.8   She is off lisinopril. -+ HL; last set of lipids: Lab Results  Component Value Date   CHOL 137 12/19/2017   HDL 43.70 12/19/2017   LDLCALC 61 12/19/2017   LDLDIRECT 54.0 07/12/2017   TRIG 163.0 (H) 12/19/2017   CHOLHDL 3 12/19/2017  On Lipitor 40, fenofibrate 160.  - last eye exam was in 03/2018: No DR- history of right cataract surgery  -She has numbness and tingling in her big toe but not in feet. Has an ingrown toenail.  She also has a history of COPD, depression, vit D def. -latest vitamin D level from 04/27/2017: 19.2  ROS: Constitutional: no weight gain/no weight loss, no fatigue, no subjective hyperthermia, no subjective hypothermia Eyes: no blurry vision, no xerophthalmia ENT: no sore throat, no nodules palpated in neck, no dysphagia, no odynophagia, no hoarseness Cardiovascular: no CP/no SOB/no palpitations/no leg swelling Respiratory: no cough/no SOB/no wheezing Gastrointestinal: no N/no V/no D/no C/no acid reflux Musculoskeletal: no muscle aches/no joint aches Skin: no rashes, no hair loss Neurological: no tremors/no numbness/no tingling/no dizziness  I reviewed pt's medications, allergies, PMH, social hx, family hx, and changes were documented in the history of present illness. Otherwise, unchanged from my initial visit note.  Past Medical History:  Diagnosis Date  . Asthma   . CHF (congestive heart failure) [facility])    hospital 11/2013-- [place]  . Chronic kidney disease   . Depression   . Diabetes mellitus ([place])   . GERD (gastroesophageal reflux disease)   . Hyperlipidemia   . Hypertension   . Stroke [facility])    Past Surgical History:  Procedure Laterality Date  . CHOLECYSTECTOMY  2002  . LOOP RECORDER INSERTION N/A 08/01/2017   Procedure: LOOP RECORDER INSERTION;  Surgeon: Constance Haw, MD;  Location: [place] [facility];  Service:  Cardiovascular;  Laterality: N/A;  . SPINE SURGERY  aug 2015   [facility]  . TEE WITHOUT CARDIOVERSION N/A 08/01/2017   Procedure: TRANSESOPHAGEAL ECHOCARDIOGRAM (TEE);  Surgeon: Sanda Klein, MD;  Location: [facility] ENDOSCOPY;  Service: Cardiovascular;  Laterality: N/A;  . TONSILLECTOMY    . TUBAL LIGATION     Social History   Socioeconomic History  . Marital status: Widowed    Spouse name: Not on file  . Number of children: 4  . Years of education: Not on file  . Highest education level: Not on file  Occupational History  . 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 Financial resource strain: Not on file  . Food insecurity:    Worry: Not on file    Inability: Not on file  . Transportation needs:    Medical: Not on file    Non-medical: Not on file  Tobacco Use  . Smoking status: Former Smoker    Packs/day: 1.50    Years: 20.00    Pack years: 30.00    Types: Cigarettes    Last attempt to quit: 08/02/1984    Years since quitting: 33.7  . Smokeless tobacco: Never Used  Substance and Sexual Activity  . Alcohol use: No  . Drug use: No  . Sexual activity: Not Currently    Partners: Male  Lifestyle  . Physical activity:    Days per week: Not on file    Minutes per session: Not on file  . Stress: Not on file  Relationships  . Social connections:    Talks on phone: Not on file    Gets together: Not on file    Attends religious service: Not on file    Active member of club or organization: Not on file    Attends meetings of clubs or organizations: Not on file    Relationship status: Not on file  . Intimate partner violence:    Fear of current or ex partner: Not on file    Emotionally abused: Not on file    Physically abused: Not on file    Forced sexual activity: Not on file  Other Topics Concern  . Not on file  Social History Narrative   NO REG EXERCISE   Caffeine use: daily   Current Outpatient Medications on File Prior to  Visit  Medication Sig Dispense Refill  . Acetaminophen (TYLENOL 8 HOUR PO) Take 500 mg by mouth.    Marland Kitchen albuterol (PROVENTIL) (2.5 MG/3ML) 0.083% nebulizer solution Take 3 mLs (2.5 mg total) by nebulization every 6 (six) hours as needed for wheezing or shortness of breath. 75 mL 12  . albuterol (VENTOLIN HFA) 108 (90 Base) MCG/ACT inhaler Inhale 2 puffs into the lungs every 4 (four) hours as needed for wheezing or shortness of breath.     . allopurinol (ZYLOPRIM) 100 MG tablet TAKE 1  TABLET BY MOUTH EVERY DAY 90 tablet 0  . atorvastatin (LIPITOR) 40 MG tablet TAKE 1 TABLET BY MOUTH EVERYDAY AT BEDTIME 90 tablet 0  . busPIRone (BUSPAR) 15 MG tablet Take 15 mg by mouth 2 (two) times daily.    . carvedilol (COREG) 6.25 MG tablet TAKE 1 TABLET (6.25 MG TOTAL) BY MOUTH 2 (TWO) TIMES DAILY WITH A MEAL. 60 tablet 3  . clopidogrel (PLAVIX) 75 MG tablet TAKE 1 TABLET BY MOUTH EVERY DAY 90 tablet 1  . ergocalciferol (VITAMIN D2) 50000 UNITS capsule Take 50,000 Units by mouth once a week. Wednesday    . fenofibrate 160 MG tablet TAKE 1 TABLET BY MOUTH EVERY DAY 90 tablet 2  . ferrous sulfate 325 (65 FE) MG tablet Take 325 mg by mouth.    . Fluticasone-Salmeterol (ADVAIR) 250-50 MCG/DOSE AEPB Inhale 1 puff into the lungs 2 (two) times daily.    Marland Kitchen glucose blood (ONE TOUCH ULTRA TEST) test strip USE TO CHECK BLOOD SUGAR ONCE A DAY. DX:E11.22 100 each 1  . glucose blood test strip Use 3x a day - One TOUch Ultra 300 each 3  . insulin aspart (NOVOLOG FLEXPEN) 100 UNIT/ML FlexPen Inject 5 units 3x a day before meals 45 mL 3  . Insulin Glargine (BASAGLAR KWIKPEN) 100 UNIT/ML SOPN Inject 0.16 mLs (16 Units total) into the skin at bedtime. 15 pen 3  . Insulin Pen Needle 32G X 4 MM MISC Use 4x a day 300 each 3  . pantoprazole (PROTONIX) 40 MG tablet TAKE 1 TABLET BY MOUTH EVERY DAY 90 tablet 1  . pramipexole (MIRAPEX) 1 MG tablet TAKE 1 TABLET (1 MG TOTAL) BY MOUTH AT BEDTIME. 90 tablet 1  . sertraline (ZOLOFT) 50 MG  tablet TAKE 1 AND 1/2 TABLETS BY MOUTH DAILY 45 tablet 0  . torsemide (DEMADEX) 20 MG tablet TAKE FOUR TABLETS BY MOUTH TWICE DAILY     No current facility-administered medications on file prior to visit.    No Known Allergies Family History  Problem Relation Age of Onset  . Hypertension Mother   . Alzheimer's disease Mother   . Hyperlipidemia Mother   . Heart disease Father        cad  . Asthma Father   . Cancer Father 59       leukemia  . Stroke Father   . Hypertension Father   . Heart disease Sister 1       MI  . Pulmonary fibrosis Sister   . Arthritis Brother   . Heart disease Maternal Uncle   . Stroke Maternal Uncle   . Uterine cancer Paternal Grandmother   . Stomach cancer Paternal Grandfather   . Heart disease Maternal Uncle   . Heart disease Maternal Uncle     PE: BP 130/70   Pulse 71   Ht 5\' 4"  (1.626 m)   Wt 188 lb (85.3 kg)   SpO2 99%   BMI 32.27 kg/m  Body mass index is 32.27 kg/m. Wt Readings from Last 3 Encounters:  04/17/18 188 lb (85.3 kg)  12/23/17 188 lb (85.3 kg)  12/19/17 190 lb 9.6 oz (86.5 kg)   Constitutional: overweight, in NAD Eyes: PERRLA, EOMI, no exophthalmos ENT: moist mucous membranes, no thyromegaly, no cervical lymphadenopathy Cardiovascular: RRR, No MRG Respiratory: CTA B Gastrointestinal: abdomen soft, NT, ND, BS+ Musculoskeletal: no deformities, strength intact in all 4 Skin: moist, warm, no rashes Neurological: no tremor with outstretched hands, DTR normal in all 4  ASSESSMENT: 1. DM2,  insulin-dependent, now controlled, with complications - CKD  2. Obesity class 3 BMI Classification:  < 18.5 underweight   18.5-24.9 normal weight   25.0-29.9 overweight   30.0-34.9 class I obesity   35.0-39.9 class II obesity   ? 40.0 class III obesity   3. HL  PLAN:  1. Patient with longstanding, now more controlled, type 2 diabetes, on basal-bolus insulin regimen, with worsening control of her diabetes around the time of  her stroke in 04/2017, but with significant improvement in her sugars since then, along with weight loss of approximately 70 pounds. We are continuing to decrease her insulin doses.  Latest HbA1c was excellent, at 5.7% 4 months ago. -At this visit, sugars are almost all at goal or very close to goal. -In this situation, especially as  today, HbA1c is 5.1% (lower), we can start NovoLog completely.  I advised patient and her daughter to keep this at hand just in case she needs a larger meal but not take it consistently.  We will continue Basaglar for now, but if she continues to do as well as now, we can most likely decrease the dose further at next visit. - I advised her to:  Patient Instructions  Please continue: - Basaglar 16 units at bedtime  Stop Novolog but keep it at hand for large meals.  Please let me know if sugars before meals are consistently >140 and after meals >180.  Please return in 4 months with your sugar log.   - continue checking sugars at different times of the day - check 2x a day, rotating checks - advised for yearly eye exams >> she is UTD - Return to clinic in 4 mo with sugar log      2. Obesity class 3 -She lost another approximately 20 pounds before last visit, but weight is stable since then.  In total, she lost almost 70 pounds in 01/2017.  3. HL - Reviewed latest lipid panel from 12/2017: LDL at goal, triglycerides slightly high Lab Results  Component Value Date   CHOL 137 12/19/2017   HDL 43.70 12/19/2017   LDLCALC 61 12/19/2017   LDLDIRECT 54.0 07/12/2017   TRIG 163.0 (H) 12/19/2017   CHOLHDL 3 12/19/2017  - Continues statin and fenofibrate without side effects.  Philemon Kingdom, MD PhD [facility] Endocrinology

## 2018-04-19 ENCOUNTER — Ambulatory Visit (INDEPENDENT_AMBULATORY_CARE_PROVIDER_SITE_OTHER): Payer: Medicare Other

## 2018-04-19 DIAGNOSIS — I639 Cerebral infarction, unspecified: Secondary | ICD-10-CM

## 2018-04-20 NOTE — Progress Notes (Signed)
Carelink Summary Report / Loop Recorder 

## 2018-04-21 LAB — CUP PACEART REMOTE DEVICE CHECK
Date Time Interrogation Session: 20200115154106
Implantable Pulse Generator Implant Date: 20190429

## 2018-04-23 ENCOUNTER — Other Ambulatory Visit: Payer: Self-pay | Admitting: Family Medicine

## 2018-04-23 LAB — CUP PACEART REMOTE DEVICE CHECK
Date Time Interrogation Session: 20191213153928
Implantable Pulse Generator Implant Date: 20190429

## 2018-05-08 ENCOUNTER — Other Ambulatory Visit: Payer: Self-pay | Admitting: Family Medicine

## 2018-05-19 ENCOUNTER — Other Ambulatory Visit: Payer: Self-pay | Admitting: Family Medicine

## 2018-05-22 ENCOUNTER — Ambulatory Visit (INDEPENDENT_AMBULATORY_CARE_PROVIDER_SITE_OTHER): Payer: Medicare Other

## 2018-05-22 DIAGNOSIS — I639 Cerebral infarction, unspecified: Secondary | ICD-10-CM | POA: Diagnosis not present

## 2018-05-23 LAB — CUP PACEART REMOTE DEVICE CHECK
Date Time Interrogation Session: 20200217164121
Implantable Pulse Generator Implant Date: 20190429

## 2018-05-31 NOTE — Progress Notes (Signed)
Carelink Summary Report / Loop Recorder 

## 2018-06-26 ENCOUNTER — Ambulatory Visit (INDEPENDENT_AMBULATORY_CARE_PROVIDER_SITE_OTHER): Payer: Medicare Other | Admitting: *Deleted

## 2018-06-26 ENCOUNTER — Other Ambulatory Visit: Payer: Self-pay

## 2018-06-26 ENCOUNTER — Ambulatory Visit: Payer: Medicare Other | Admitting: Family Medicine

## 2018-06-26 ENCOUNTER — Ambulatory Visit: Payer: Self-pay | Admitting: Adult Health

## 2018-06-26 DIAGNOSIS — I639 Cerebral infarction, unspecified: Secondary | ICD-10-CM

## 2018-06-27 LAB — CUP PACEART REMOTE DEVICE CHECK
Date Time Interrogation Session: 20200321163544
Implantable Pulse Generator Implant Date: 20190429

## 2018-07-05 NOTE — Progress Notes (Signed)
Carelink Summary Report / Loop Recorder 

## 2018-07-09 ENCOUNTER — Other Ambulatory Visit: Payer: Self-pay | Admitting: Family Medicine

## 2018-07-11 ENCOUNTER — Other Ambulatory Visit: Payer: Self-pay | Admitting: Family Medicine

## 2018-07-11 DIAGNOSIS — F329 Major depressive disorder, single episode, unspecified: Secondary | ICD-10-CM

## 2018-07-11 DIAGNOSIS — F32A Depression, unspecified: Secondary | ICD-10-CM

## 2018-07-12 ENCOUNTER — Telehealth: Payer: Self-pay | Admitting: *Deleted

## 2018-07-12 MED ORDER — FENOFIBRATE 200 MG PO CAPS: 200.0000 mg | ORAL_CAPSULE | Freq: Every day | ORAL | 1 refills | Status: DC

## 2018-07-12 NOTE — Telephone Encounter (Signed)
rx sent in 

## 2018-07-12 NOTE — Telephone Encounter (Signed)
Ok to send 200 mg in

## 2018-07-12 NOTE — Telephone Encounter (Signed)
[facility] over a request for new rx.  Patient paying $93 for fenofibrate 160mg .    Patient requesting lower cost alternative:  Fenofibrate 200mg  $70.

## 2018-07-17 ENCOUNTER — Ambulatory Visit (INDEPENDENT_AMBULATORY_CARE_PROVIDER_SITE_OTHER): Payer: Medicare Other | Admitting: Family Medicine

## 2018-07-17 ENCOUNTER — Encounter: Payer: Self-pay | Admitting: Family Medicine

## 2018-07-17 ENCOUNTER — Other Ambulatory Visit: Payer: Self-pay

## 2018-07-17 DIAGNOSIS — M170 Bilateral primary osteoarthritis of knee: Secondary | ICD-10-CM

## 2018-07-17 DIAGNOSIS — IMO0002 Reserved for concepts with insufficient information to code with codable children: Secondary | ICD-10-CM

## 2018-07-17 DIAGNOSIS — E785 Hyperlipidemia, unspecified: Secondary | ICD-10-CM

## 2018-07-17 DIAGNOSIS — I10 Essential (primary) hypertension: Secondary | ICD-10-CM

## 2018-07-17 DIAGNOSIS — E1165 Type 2 diabetes mellitus with hyperglycemia: Secondary | ICD-10-CM | POA: Diagnosis not present

## 2018-07-17 DIAGNOSIS — I639 Cerebral infarction, unspecified: Secondary | ICD-10-CM

## 2018-07-17 DIAGNOSIS — I509 Heart failure, unspecified: Secondary | ICD-10-CM | POA: Diagnosis not present

## 2018-07-17 DIAGNOSIS — E1151 Type 2 diabetes mellitus with diabetic peripheral angiopathy without gangrene: Secondary | ICD-10-CM

## 2018-07-17 MED ORDER — DICLOFENAC SODIUM 1 % TD GEL: 4.0000 g | Freq: Four times a day (QID) | TRANSDERMAL | 2 refills | Status: DC

## 2018-07-17 NOTE — Progress Notes (Signed)
Virtual Visit via Video Note  I connected with Erin [name] on 07/17/18 at  2:00 PM EDT by a video enabled telemedicine application and verified that I am speaking with the correct person using two identifiers.   I discussed the limitations of evaluation and management by telemedicine and the availability of in person appointments. The patient expressed understanding and agreed to proceed.  History of Present Illness: Pt is at home with her daughter --- f/u with bp and cholesterol   Pt c/o both knees hurting.  She saw ortho years ago but does not remember who and neither does her daughter.  She knows its arthritis but otc not helping and gerd is worsening.  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 Primary osteoarthritis of both knees Refer to orth--- call or rto if voltaren does not help - diclofenac sodium (VOLTAREN) 1 % GEL; Apply 4 g topically 4 (four) times daily.  Dispense: 100 g; Refill: 2 - Ambulatory referral to Orthopedic Surgery  2. Congestive heart failure, unspecified HF chronicity, unspecified heart failure type [facility]) Per cardiology  3. DM (diabetes mellitus) type II uncontrolled, periph vascular disorder ([place]) Per endo  4. Essential hypertension Well controlled, no changes to meds. Encouraged heart healthy diet such as the DASH diet and exercise as tolerated.   5. Hyperlipidemia, unspecified hyperlipidemia type Encouraged heart healthy diet, increase exercise, avoid trans fats, consider a krill oil cap daily   Follow Up Instructions:    I discussed the assessment and treatment plan with the patient. The patient was provided an opportunity to ask questions and all were answered. The patient agreed with the plan and  demonstrated an understanding of the instructions.   The patient was advised to call back or seek an in-person evaluation if the symptoms worsen or if the condition fails to improve as anticipated.     Ann Held, DO

## 2018-07-17 NOTE — Assessment & Plan Note (Signed)
Per cardiology Cont' meds Pt with no symptoms

## 2018-07-17 NOTE — Assessment & Plan Note (Signed)
Per endo °

## 2018-07-17 NOTE — Assessment & Plan Note (Signed)
Encouraged heart healthy diet, increase exercise, avoid trans fats, consider a krill oil cap daily 

## 2018-07-17 NOTE — Assessment & Plan Note (Signed)
Well controlled, no changes to meds. Encouraged heart healthy diet such as the DASH diet and exercise as tolerated.  °

## 2018-07-24 DIAGNOSIS — R5383 Other fatigue: Secondary | ICD-10-CM | POA: Diagnosis not present

## 2018-07-24 DIAGNOSIS — G4733 Obstructive sleep apnea (adult) (pediatric): Secondary | ICD-10-CM | POA: Diagnosis not present

## 2018-07-24 DIAGNOSIS — J310 Chronic rhinitis: Secondary | ICD-10-CM | POA: Diagnosis not present

## 2018-07-24 DIAGNOSIS — J4520 Mild intermittent asthma, uncomplicated: Secondary | ICD-10-CM | POA: Diagnosis not present

## 2018-07-27 ENCOUNTER — Other Ambulatory Visit: Payer: Self-pay

## 2018-07-27 ENCOUNTER — Ambulatory Visit (INDEPENDENT_AMBULATORY_CARE_PROVIDER_SITE_OTHER): Payer: Medicare Other | Admitting: *Deleted

## 2018-07-27 DIAGNOSIS — I639 Cerebral infarction, unspecified: Secondary | ICD-10-CM

## 2018-07-27 LAB — CUP PACEART REMOTE DEVICE CHECK
Date Time Interrogation Session: 20200423150615
Implantable Pulse Generator Implant Date: 20190429

## 2018-07-31 ENCOUNTER — Other Ambulatory Visit: Payer: Self-pay | Admitting: *Deleted

## 2018-07-31 MED ORDER — BUSPIRONE HCL 15 MG PO TABS: 15.0000 mg | ORAL_TABLET | Freq: Three times a day (TID) | ORAL | 1 refills | Status: DC

## 2018-08-02 ENCOUNTER — Other Ambulatory Visit: Payer: Self-pay | Admitting: *Deleted

## 2018-08-02 DIAGNOSIS — I10 Essential (primary) hypertension: Secondary | ICD-10-CM

## 2018-08-02 MED ORDER — ALLOPURINOL 100 MG PO TABS: 100.0000 mg | ORAL_TABLET | Freq: Every day | ORAL | 1 refills | Status: DC

## 2018-08-02 MED ORDER — CLOPIDOGREL BISULFATE 75 MG PO TABS: 75.0000 mg | ORAL_TABLET | Freq: Every day | ORAL | 1 refills | Status: DC

## 2018-08-02 MED ORDER — CARVEDILOL 6.25 MG PO TABS: 6.2500 mg | ORAL_TABLET | Freq: Two times a day (BID) | ORAL | 1 refills | Status: DC

## 2018-08-02 NOTE — Addendum Note (Signed)
Addended by: Kem Boroughs D on: 08/02/2018 11:55 AM   Modules accepted: Orders

## 2018-08-04 NOTE — Progress Notes (Signed)
Carelink Summary Report / Loop Recorder 

## 2018-08-17 IMAGING — DX DG TIBIA/FIBULA 2V*R*
4 series · 4 of 4 positions shown · non-contrast
Comparison: Right knee films of 12/04/2015

CLINICAL DATA: History of right lower extremity cellulitis and
wound, diabetic

EXAM:
RIGHT TIBIA AND FIBULA - 2 VIEW

[tibia ap (1 of 2)]
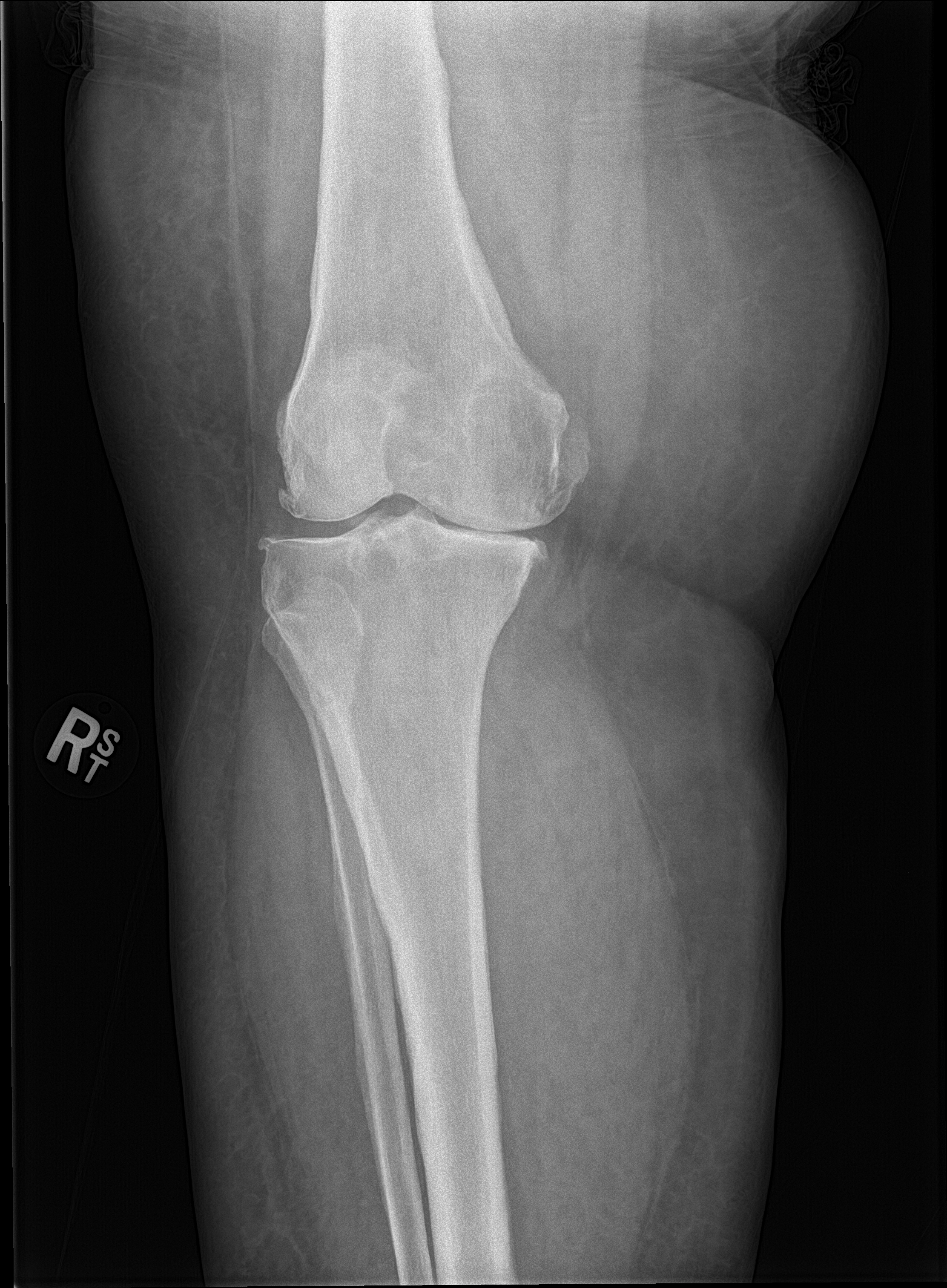

[tibia ap (2 of 2)]
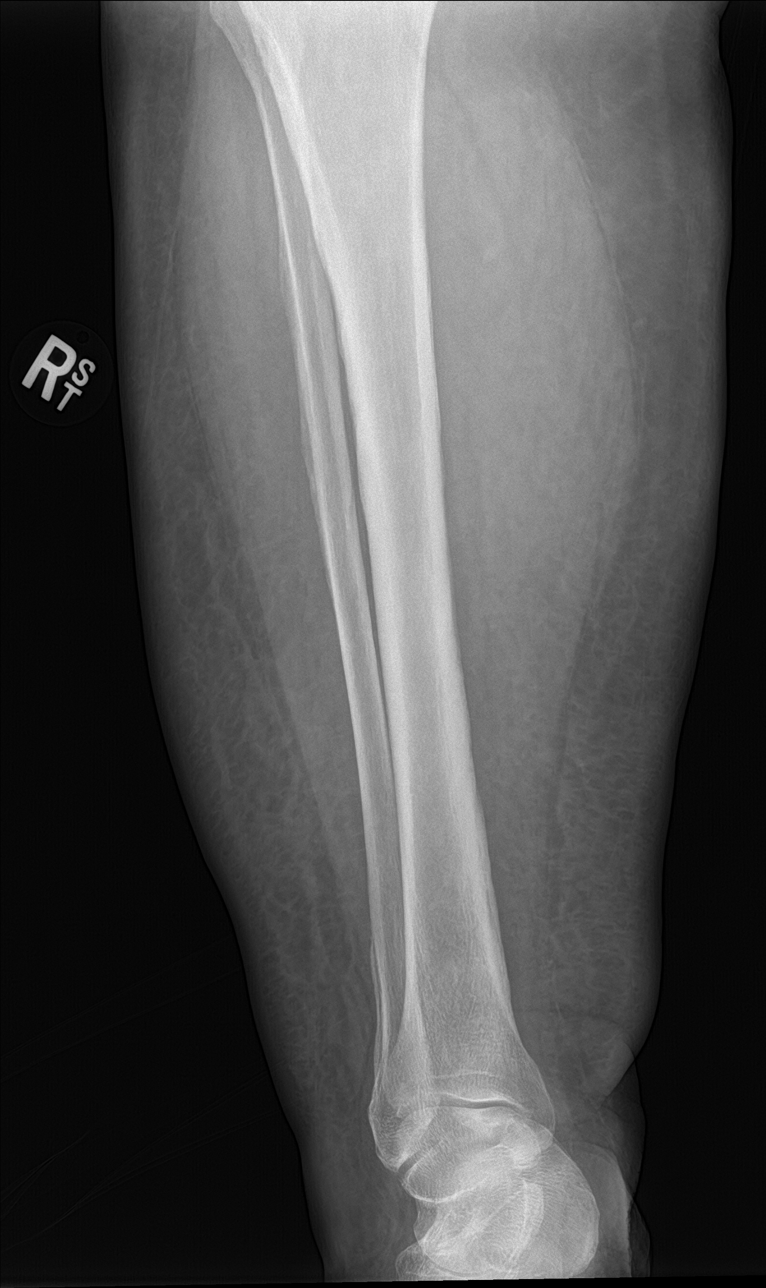

[tibia lat (1 of 2)]
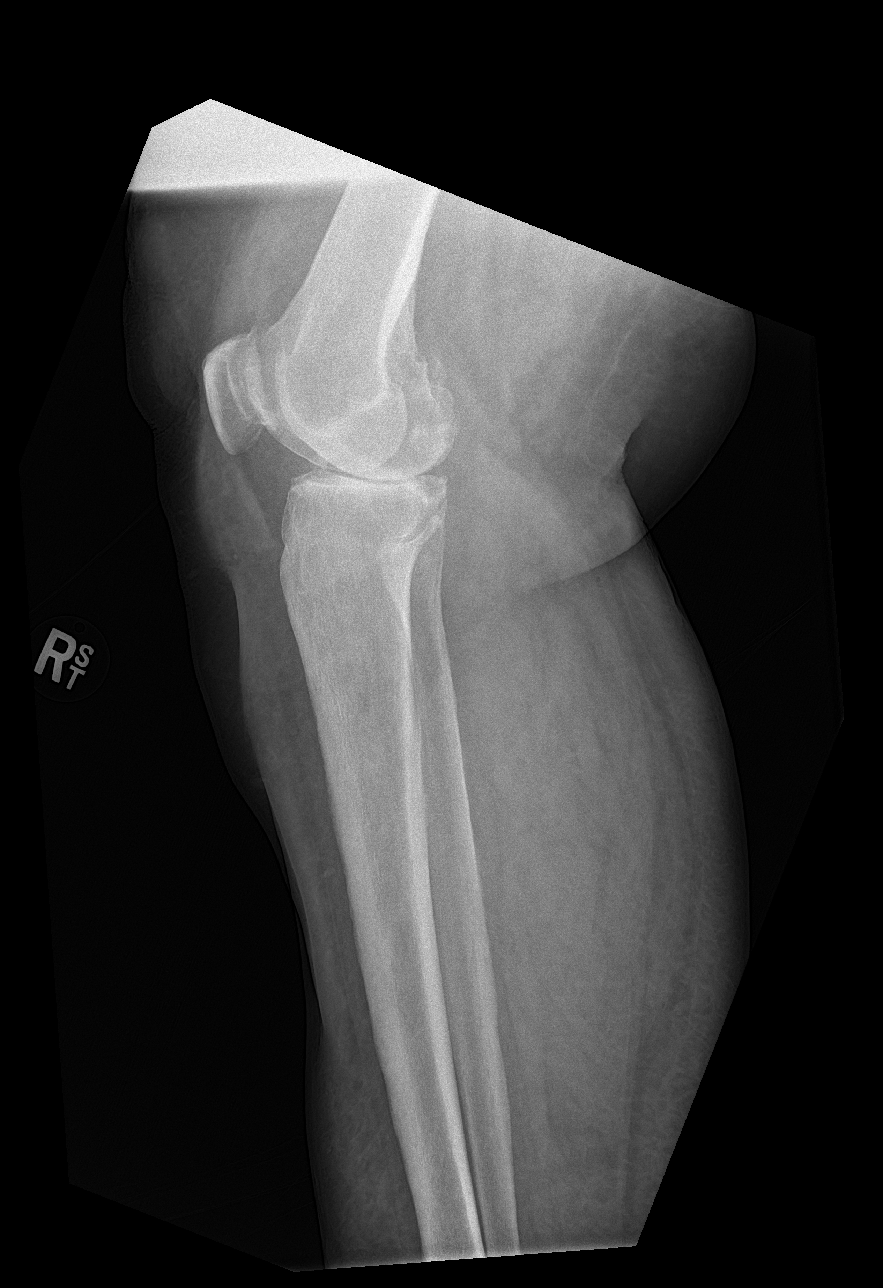

[tibia lat (2 of 2)]
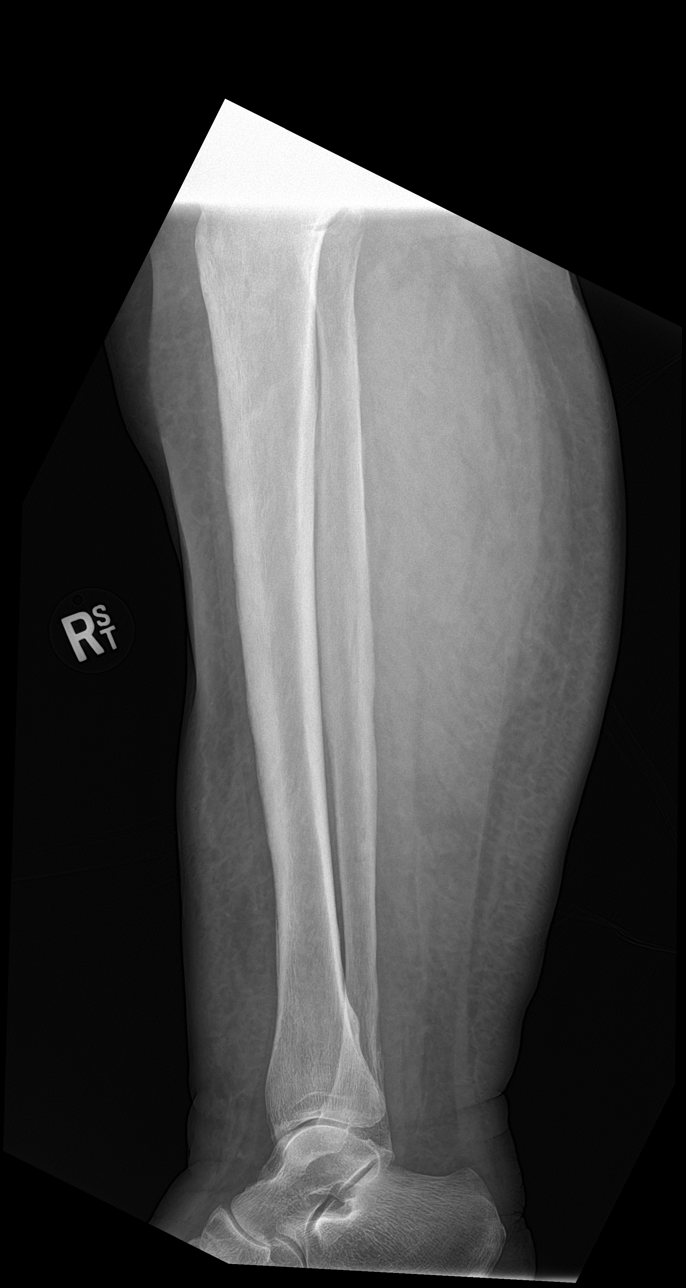

[4 of 4 positions shown; findings below may reference images not displayed]

FINDINGS: There is tricompartmental degenerative joint disease of the right
knee primarily involving the medial compartment. There is soft
tissue swelling of the right lower extremity which appears to be
typical of diffuse edema. No evidence of osteomyelitis is seen by
plain film.
IMPRESSION: 1. Edema of the soft tissues of the right lower extremity.
2. No radiographic evidence of osteomyelitis.
3. Tricompartmental degenerative joint disease of the right knee

## 2018-08-18 ENCOUNTER — Ambulatory Visit (INDEPENDENT_AMBULATORY_CARE_PROVIDER_SITE_OTHER): Payer: Medicare Other | Admitting: Internal Medicine

## 2018-08-18 ENCOUNTER — Other Ambulatory Visit: Payer: Self-pay

## 2018-08-18 ENCOUNTER — Encounter: Payer: Self-pay | Admitting: Internal Medicine

## 2018-08-18 DIAGNOSIS — E785 Hyperlipidemia, unspecified: Secondary | ICD-10-CM

## 2018-08-18 DIAGNOSIS — Z794 Long term (current) use of insulin: Secondary | ICD-10-CM

## 2018-08-18 DIAGNOSIS — E1121 Type 2 diabetes mellitus with diabetic nephropathy: Secondary | ICD-10-CM | POA: Diagnosis not present

## 2018-08-18 DIAGNOSIS — I639 Cerebral infarction, unspecified: Secondary | ICD-10-CM | POA: Diagnosis not present

## 2018-08-18 NOTE — Progress Notes (Signed)
Patient ID: Erin [name], female   DOB: [birth year], 73 y.o.   MRN: [id number]  Patient location: Home My location: Office  Referring Provider: Carollee [name], Erin Apa, DO  I connected with the patien and her daughter on 08/18/18 at  3:34 PM EDT by a video enabled telemedicine application and verified that I am speaking with the correct person.   I discussed the limitations of evaluation and management by telemedicine and the availability of in person appointments. The patient expressed understanding and agreed to proceed.   Details of the encounter are shown below.  HPI: Erin [name] is a 73 y.o.-year-old female, returning for f/u for DM2, dx 2013, insulin-dependent, uncontrolled, with complications (CKD, cerebrovascular disease-s/p stroke 04/2017). Last visit 4 months ago.  She usually comes to the clinic with her daughter who offers almost all of the history as patient is mostly nonverbal.  At last visit, she was back leading at her home, previously living with son.  She is not missing medicines and she is administering her medicines by herself, checking them off the list prepared by her son.  Her sugars improved tremendously in the last 1.5 years.   Latest HbA1c was: Lab Results  Component Value Date   HGBA1C 5.1 04/17/2018   HGBA1C 5.7 12/19/2017   HGBA1C 7.2 (H) 07/12/2017   HGBA1C 10.6 (H) 01/25/2017   HGBA1C 8.4 11/08/2016   HGBA1C 12.4 07/23/2016   HGBA1C 11.2 Repeated and verified X2. (H) 01/06/2016   HGBA1C 8.4 05/01/2015   HGBA1C 9.0 (H) 12/10/2014   HGBA1C 5.8 01/11/2014  04/27/2017: HbA1c 11.2%  Pt is now on: - Basaglar 40 >> 30 >> 20 >> 16 units at bedtime  We were able to stop NovoLog at last visit. Prev.   Pt was checking her sugars 3 times a day per daughters review of her log: - am: 40, 96-132, 143 >> 72, 80-126, 131 >> 64-144 (ave 100) - 2h after b'fast: 300s >> 185-220 >> n/c - before lunch: 100-129 >> 65, 78-132, 137 >> 78-151 (ave 120) - 2h after  lunch: 300s-400 >> 207-244 >> n/c - before dinner: 109-175, 194 >> 87-150, 174, 180 >> 88, 135-145, 175 - 2h after dinner:  n/c >> 290s >> n/c - bedtime: 15400-867 >> n/c >> 130-160, 179 >> n/c Lowest sugar was 40 x1 >> 65 >> 64; she has hypoglycemia awareness in the 70s. Highest sugar was 400s ...>> 180 >> 175.  Meals: - Breakfast: eggs + toast; occas. grits - Lunch: sandwich, soup - Dinner: meat + veggies + starch or sandwich - Snacks: grapes, icecream  -+ CAD.  Last BUN/creatinine:  Lab Results  Component Value Date   BUN 52 (H) 12/19/2017   CREATININE 1.44 (H) 12/19/2017  04/28/2017: GFR 41 04/27/2017: 34/1.45, GFR 34 11/30/2013 [facility] - Cr 1.8   Off lisinopril. -+ HL; last set of lipids: Lab Results  Component Value Date   CHOL 137 12/19/2017   HDL 43.70 12/19/2017   LDLCALC 61 12/19/2017   LDLDIRECT 54.0 07/12/2017   TRIG 163.0 (H) 12/19/2017   CHOLHDL 3 12/19/2017  On  Lipitor 40, fenofibrate 160.  - last eye exam was in 03/2018: No DR.  She has a history of right eye cataract surgery.  -+ numbness and tingling in her big toe but not in feet.  She has an ingrown toenail.  She also has a history of COPD, depression, vit D def. -latest vitamin D level from 04/27/2017: 19.2  ROS: Constitutional: no  weight gain/no weight loss, no fatigue, no subjective hyperthermia, no subjective hypothermia Eyes: no blurry vision, no xerophthalmia ENT: no sore throat, no nodules palpated in neck, no dysphagia, no odynophagia, no hoarseness Cardiovascular: no CP/no SOB/no palpitations/no leg swelling Respiratory: no cough/no SOB/no wheezing Gastrointestinal: no N/no V/no D/no C/no acid reflux Musculoskeletal: no muscle aches/no joint aches Skin: no rashes, no hair loss Neurological: no tremors/+ numbness/+ tingling/no dizziness  I reviewed pt's medications, allergies, PMH, social hx, family hx, and changes were documented in the history of present illness. Otherwise,  unchanged from my initial visit note.  Past Medical History:  Diagnosis Date  . Asthma   . CHF (congestive heart failure) [facility])    hospital 11/2013-- [place]  . Chronic kidney disease   . Depression   . Diabetes mellitus ([place])   . GERD (gastroesophageal reflux disease)   . Hyperlipidemia   . Hypertension   . Stroke [facility])    Past Surgical History:  Procedure Laterality Date  . CHOLECYSTECTOMY  2002  . LOOP RECORDER INSERTION N/A 08/01/2017   Procedure: LOOP RECORDER INSERTION;  Surgeon: Erin Haw, MD;  Location: [place] [facility];  Service: Cardiovascular;  Laterality: N/A;  . SPINE SURGERY  aug 2015   [facility]  . TEE WITHOUT CARDIOVERSION N/A 08/01/2017   Procedure: TRANSESOPHAGEAL ECHOCARDIOGRAM (TEE);  Surgeon: Erin Klein, MD;  Location: [facility] ENDOSCOPY;  Service: Cardiovascular;  Laterality: N/A;  . TONSILLECTOMY    . TUBAL LIGATION     Social History   Socioeconomic History  . Marital status: Widowed    Spouse name: Not on file  . Number of children: 4  . Years of education: Not on file  . Highest education level: Not on file  Occupational History  . 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 Financial resource strain: Not on file  . Food insecurity:    Worry: Not on file    Inability: Not on file  . Transportation needs:    Medical: Not on file    Non-medical: Not on file  Tobacco Use  . Smoking status: Former Smoker    Packs/day: 1.50    Years: 20.00    Pack years: 30.00    Types: Cigarettes    Last attempt to quit: 08/02/1984    Years since quitting: 34.0  . Smokeless tobacco: Never Used  Substance and Sexual Activity  . Alcohol use: No  . Drug use: No  . Sexual activity: Not Currently    Partners: Male  Lifestyle  . Physical activity:    Days per week: Not on file    Minutes per session: Not on file  . Stress: Not on file  Relationships  . Social connections:    Talks on phone: Not on  file    Gets together: Not on file    Attends religious service: Not on file    Active member of club or organization: Not on file    Attends meetings of clubs or organizations: Not on file    Relationship status: Not on file  . Intimate partner violence:    Fear of current or ex partner: Not on file    Emotionally abused: Not on file    Physically abused: Not on file    Forced sexual activity: Not on file  Other Topics Concern  . Not on file  Social History Narrative   NO REG EXERCISE   Caffeine use: daily  Current Outpatient Medications on File Prior to Visit  Medication Sig Dispense Refill  . Acetaminophen (TYLENOL 8 HOUR PO) Take 500 mg by mouth.    Marland Kitchen albuterol (PROVENTIL) (2.5 MG/3ML) 0.083% nebulizer solution Take 3 mLs (2.5 mg total) by nebulization every 6 (six) hours as needed for wheezing or shortness of breath. 75 mL 12  . albuterol (VENTOLIN HFA) 108 (90 Base) MCG/ACT inhaler Inhale 2 puffs into the lungs every 4 (four) hours as needed for wheezing or shortness of breath.     . allopurinol (ZYLOPRIM) 100 MG tablet Take 1 tablet (100 mg total) by mouth daily. 90 tablet 1  . atorvastatin (LIPITOR) 40 MG tablet TAKE 1 TABLET BY MOUTH EVERYDAY AT BEDTIME 90 tablet 1  . busPIRone (BUSPAR) 15 MG tablet Take 1 tablet (15 mg total) by mouth 3 (three) times daily. 270 tablet 1  . carvedilol (COREG) 6.25 MG tablet Take 1 tablet (6.25 mg total) by mouth 2 (two) times daily with a meal. 180 tablet 1  . clopidogrel (PLAVIX) 75 MG tablet Take 1 tablet (75 mg total) by mouth daily. 90 tablet 1  . diclofenac sodium (VOLTAREN) 1 % GEL Apply 4 g topically 4 (four) times daily. 100 g 2  . ergocalciferol (VITAMIN D2) 50000 UNITS capsule Take 50,000 Units by mouth once a week. Wednesday    . fenofibrate micronized (LOFIBRA) 200 MG capsule Take 1 capsule (200 mg total) by mouth daily. 90 capsule 1  . ferrous sulfate 325 (65 FE) MG tablet Take 325 mg by mouth.    . Fluticasone-Salmeterol  (ADVAIR) 250-50 MCG/DOSE AEPB Inhale 1 puff into the lungs 2 (two) times daily.    Marland Kitchen glucose blood test strip Use 3x a day - One TOUch Ultra 300 each 3  . insulin aspart (NOVOLOG FLEXPEN) 100 UNIT/ML FlexPen Inject 5 units 3x a day before meals 45 mL 3  . Insulin Glargine (BASAGLAR KWIKPEN) 100 UNIT/ML SOPN Inject 0.16 mLs (16 Units total) into the skin at bedtime. 15 pen 3  . Insulin Pen Needle 32G X 4 MM MISC Use 4x a day 300 each 3  . pantoprazole (PROTONIX) 40 MG tablet TAKE 1 TABLET BY MOUTH EVERY DAY 90 tablet 1  . pramipexole (MIRAPEX) 1 MG tablet TAKE 1 TABLET (1 MG TOTAL) BY MOUTH AT BEDTIME. 90 tablet 1  . sertraline (ZOLOFT) 50 MG tablet TAKE 1 AND 1/2 TABLETS BY MOUTH DAILY 135 tablet 1  . torsemide (DEMADEX) 20 MG tablet TAKE FOUR TABLETS BY MOUTH TWICE DAILY     No current facility-administered medications on file prior to visit.    No Known Allergies Family History  Problem Relation Age of Onset  . Hypertension Mother   . Alzheimer's disease Mother   . Hyperlipidemia Mother   . Heart disease Father        cad  . Asthma Father   . Cancer Father 20       leukemia  . Stroke Father   . Hypertension Father   . Heart disease Sister 32       MI  . Pulmonary fibrosis Sister   . Arthritis Brother   . Heart disease Maternal Uncle   . Stroke Maternal Uncle   . Uterine cancer Paternal Grandmother   . Stomach cancer Paternal Grandfather   . Heart disease Maternal Uncle   . Heart disease Maternal Uncle     PE: There were no vitals taken for this visit. There is no height or weight on  file to calculate BMI. Wt Readings from Last 3 Encounters:  04/17/18 188 lb (85.3 kg)  12/23/17 188 lb (85.3 kg)  12/19/17 190 lb 9.6 oz (86.5 kg)   Constitutional:  in NAD  The physical exam was not performed (virtual visit).  ASSESSMENT: 1. DM2, insulin-dependent, now controlled, with complications - CKD  2. Obesity class 3 BMI Classification:  < 18.5 underweight   18.5-24.9  normal weight   25.0-29.9 overweight   30.0-34.9 class I obesity   35.0-39.9 class II obesity   ? 40.0 class III obesity   3. HL  PLAN:  1. Patient with longstanding, previously uncontrolled diabetes now more controlled after starting to be compliant with insulin and controlling her diet.  She was previously with her son but now lives by herself again and takes her medicines based off a list prepared by her son.  Her HbA1c at last visit was excellent, at 5.1% so we were able to stop her mealtime insulin.  Almost all of her sugars were at goal then. -At this visit, sugars are at or close to goal before meals.  She is doing a good job checking sugars 3 times a day.  For now, I do not feel that she needs to add back NovoLog.  We did discuss that if her sugars start increasing, we may need to increase her insulin, but for now, I just advised her to continue Basaglar 16 units at bedtime. - I advised her to:  Patient Instructions  Please continue: - Basaglar 16 units at bedtime  Please let me know if sugars before meals are consistently >140 and after meals >180.  Please return in 4 months with your sugar log.   - We will check her HbA1c when she returns to the clinic.  - continue checking sugars at different times of the day - check 1x a day, rotating checks - advised for yearly eye exams >> she is UTD - Return to clinic in 4 mo with sugar log       2. Obesity class 3 -At last visit, weight was stable, but she lost 20 pounds before the previous visit.  In total, before last visit, she lost almost 70 pounds since 01/2017. -Discussing with daughter, she does not feel that the patient has changed her weight by more than 2 to 3 pounds in the last 4 months.  3. HL - Reviewed latest lipid panel from 12/2017: LDL at goal, triglycerides slightly high Lab Results  Component Value Date   CHOL 137 12/19/2017   HDL 43.70 12/19/2017   LDLCALC 61 12/19/2017   LDLDIRECT 54.0 07/12/2017   TRIG  163.0 (H) 12/19/2017   CHOLHDL 3 12/19/2017  - Continues statin and fenofibrate without side effects.  Philemon Kingdom, MD PhD [facility] Endocrinology

## 2018-08-18 NOTE — Patient Instructions (Signed)
Please continue: - Basaglar 16 units at bedtime Keep NovoLog at hand only for large meals.  Please let me know if sugars before meals are consistently >140 and after meals >180.  Please return in 4 months with your sugar log.

## 2018-08-21 ENCOUNTER — Ambulatory Visit: Payer: Medicare Other | Admitting: Family Medicine

## 2018-08-21 ENCOUNTER — Other Ambulatory Visit: Payer: Medicare Other

## 2018-08-21 ENCOUNTER — Other Ambulatory Visit: Payer: Self-pay | Admitting: Family Medicine

## 2018-08-21 ENCOUNTER — Telehealth: Payer: Self-pay | Admitting: *Deleted

## 2018-08-21 NOTE — Telephone Encounter (Signed)
Copied from [place] [phone]. Topic: Quick Communication - Appointment Cancellation >> Aug 17, 2018  4:08 PM Nils Flack wrote: Patient called to cancel appointment scheduled for 08/21/18. Patient has not rescheduled their appointment. Please call Marcie Bal back to r/s [phone]   Route to department's PEC pool.

## 2018-08-24 ENCOUNTER — Other Ambulatory Visit: Payer: Self-pay | Admitting: Family Medicine

## 2018-08-24 DIAGNOSIS — K219 Gastro-esophageal reflux disease without esophagitis: Secondary | ICD-10-CM

## 2018-08-30 ENCOUNTER — Ambulatory Visit (INDEPENDENT_AMBULATORY_CARE_PROVIDER_SITE_OTHER): Payer: Medicare Other | Admitting: *Deleted

## 2018-08-30 DIAGNOSIS — I639 Cerebral infarction, unspecified: Secondary | ICD-10-CM

## 2018-08-30 LAB — CUP PACEART REMOTE DEVICE CHECK
Date Time Interrogation Session: 20200526173749
Implantable Pulse Generator Implant Date: 20190429

## 2018-08-31 ENCOUNTER — Other Ambulatory Visit: Payer: Self-pay | Admitting: *Deleted

## 2018-08-31 ENCOUNTER — Other Ambulatory Visit (INDEPENDENT_AMBULATORY_CARE_PROVIDER_SITE_OTHER): Payer: Medicare Other

## 2018-08-31 ENCOUNTER — Other Ambulatory Visit: Payer: Self-pay

## 2018-08-31 DIAGNOSIS — E1151 Type 2 diabetes mellitus with diabetic peripheral angiopathy without gangrene: Secondary | ICD-10-CM

## 2018-08-31 DIAGNOSIS — E1165 Type 2 diabetes mellitus with hyperglycemia: Secondary | ICD-10-CM

## 2018-08-31 DIAGNOSIS — I10 Essential (primary) hypertension: Secondary | ICD-10-CM

## 2018-08-31 DIAGNOSIS — IMO0002 Reserved for concepts with insufficient information to code with codable children: Secondary | ICD-10-CM

## 2018-08-31 DIAGNOSIS — E785 Hyperlipidemia, unspecified: Secondary | ICD-10-CM | POA: Diagnosis not present

## 2018-08-31 LAB — COMPREHENSIVE METABOLIC PANEL
ALT: 17 U/L (ref 0–35)
AST: 25 U/L (ref 0–37)
Albumin: 4.2 g/dL (ref 3.5–5.2)
Alkaline Phosphatase: 54 U/L (ref 39–117)
BUN: 50 mg/dL — ABNORMAL HIGH (ref 6–23)
CO2: 27 mEq/L (ref 19–32)
Calcium: 9.1 mg/dL (ref 8.4–10.5)
Chloride: 103 mEq/L (ref 96–112)
Creatinine, Ser: 1.48 mg/dL — ABNORMAL HIGH (ref 0.40–1.20)
GFR: 34.54 mL/min — ABNORMAL LOW (ref >60.00)
Glucose, Bld: 86 mg/dL (ref 70–99)
Potassium: 4.1 mEq/L (ref 3.5–5.1)
Sodium: 140 mEq/L (ref 135–145)
Total Bilirubin: 0.6 mg/dL (ref 0.2–1.2)
Total Protein: 6.7 g/dL (ref 6.0–8.3)

## 2018-08-31 LAB — LIPID PANEL
Cholesterol: 125 mg/dL (ref 0–200)
HDL: 46 mg/dL (ref >39.00)
LDL Cholesterol: 56 mg/dL (ref 0–99)
NonHDL: 79.44
Total CHOL/HDL Ratio: 3
Triglycerides: 117 mg/dL (ref 0.0–149.0)
VLDL: 23.4 mg/dL (ref 0.0–40.0)

## 2018-08-31 LAB — HEMOGLOBIN A1C: Hgb A1c MFr Bld: 5.8 % (ref 4.6–6.5)

## 2018-08-31 NOTE — Progress Notes (Signed)
a1c

## 2018-08-31 NOTE — Addendum Note (Signed)
Addended by: Kelle Darting A on: 08/31/2018 10:13 AM   Modules accepted: Orders

## 2018-09-06 ENCOUNTER — Encounter: Payer: Self-pay | Admitting: *Deleted

## 2018-09-06 DIAGNOSIS — G4733 Obstructive sleep apnea (adult) (pediatric): Secondary | ICD-10-CM | POA: Diagnosis not present

## 2018-09-07 NOTE — Progress Notes (Signed)
Carelink Summary Report / Loop Recorder 

## 2018-09-12 DIAGNOSIS — J310 Chronic rhinitis: Secondary | ICD-10-CM | POA: Diagnosis not present

## 2018-09-12 DIAGNOSIS — J4520 Mild intermittent asthma, uncomplicated: Secondary | ICD-10-CM | POA: Diagnosis not present

## 2018-09-12 DIAGNOSIS — G4733 Obstructive sleep apnea (adult) (pediatric): Secondary | ICD-10-CM | POA: Diagnosis not present

## 2018-09-12 DIAGNOSIS — R5383 Other fatigue: Secondary | ICD-10-CM | POA: Diagnosis not present

## 2018-09-19 ENCOUNTER — Encounter: Payer: Self-pay | Admitting: Family Medicine

## 2018-09-19 ENCOUNTER — Ambulatory Visit (INDEPENDENT_AMBULATORY_CARE_PROVIDER_SITE_OTHER): Payer: Medicare Other | Admitting: Family Medicine

## 2018-09-19 VITALS — BP 146/64 | Ht 64.0 in | Wt 189.0 lb

## 2018-09-19 DIAGNOSIS — L089 Local infection of the skin and subcutaneous tissue, unspecified: Secondary | ICD-10-CM

## 2018-09-19 DIAGNOSIS — M170 Bilateral primary osteoarthritis of knee: Secondary | ICD-10-CM

## 2018-09-19 DIAGNOSIS — I639 Cerebral infarction, unspecified: Secondary | ICD-10-CM | POA: Diagnosis not present

## 2018-09-19 MED ORDER — CEPHALEXIN 500 MG PO CAPS: 500.0000 mg | ORAL_CAPSULE | Freq: Two times a day (BID) | ORAL | 0 refills | Status: DC

## 2018-09-19 MED ORDER — DICLOFENAC SODIUM 1 % TD GEL: 4.0000 g | Freq: Four times a day (QID) | TRANSDERMAL | 2 refills | Status: DC

## 2018-09-19 NOTE — Progress Notes (Signed)
Virtual Visit via Video Note  I connected with Erin [name] on 09/19/18 at  3:15 PM EDT by a video enabled telemedicine application and verified that I am speaking with the correct person using two identifiers.  Location: Patient: home with daughter  Provider: office    I discussed the limitations of evaluation and management by telemedicine and the availability of in person appointments. The patient expressed understanding and agreed to proceed.  History of Present Illness: Pt is home with daughter c/o stubbing toe on Thursday on cement wall while painting  Part of the toenail did come off   She is still also c/o about her knees.  She never did go to ortho ---- we will need to check on this    Observations/Objective: Vitals:   09/19/18 1521  BP: (!) 146/64    Pt is NAD  Assessment and Plan: 1. Toe infection Keep clean --- keep covered rto prn Finish abx  - cephALEXin (KEFLEX) 500 MG capsule; Take 1 capsule (500 mg total) by mouth 2 (two) times daily.  Dispense: 20 capsule; Refill: 0  2. Primary osteoarthritis of both knees Pt never went to ortho Will need to check on referral - diclofenac sodium (VOLTAREN) 1 % GEL; Apply 4 g topically 4 (four) times daily.  Dispense: 100 g; Refill: 2  Follow Up Instructions:    I discussed the assessment and treatment plan with the patient. The patient was provided an opportunity to ask questions and all were answered. The patient agreed with the plan and demonstrated an understanding of the instructions.   The patient was advised to call back or seek an in-person evaluation if the symptoms worsen or if the condition fails to improve as anticipated.  I provided 15 minutes of non-face-to-face time during this encounter.   Ann Held, DO

## 2018-09-21 ENCOUNTER — Other Ambulatory Visit: Payer: Self-pay | Admitting: Family Medicine

## 2018-09-21 ENCOUNTER — Telehealth: Payer: Self-pay

## 2018-09-21 DIAGNOSIS — M25562 Pain in left knee: Secondary | ICD-10-CM

## 2018-09-21 DIAGNOSIS — G8929 Other chronic pain: Secondary | ICD-10-CM

## 2018-09-21 NOTE — Telephone Encounter (Signed)
Left vm for patient to call back that visit will be change to mychart video due to covid 19.If pt does not have mychart account we can email or text her the link.

## 2018-09-21 NOTE — Telephone Encounter (Signed)
PT does have a mychart account. Rn sent her a message about visit on 6/23 at 1245pm.

## 2018-09-26 ENCOUNTER — Ambulatory Visit: Payer: Self-pay | Admitting: Adult Health

## 2018-10-02 ENCOUNTER — Ambulatory Visit (INDEPENDENT_AMBULATORY_CARE_PROVIDER_SITE_OTHER): Payer: Medicare Other | Admitting: *Deleted

## 2018-10-02 DIAGNOSIS — I639 Cerebral infarction, unspecified: Secondary | ICD-10-CM | POA: Diagnosis not present

## 2018-10-02 DIAGNOSIS — T63441A Toxic effect of venom of bees, accidental (unintentional), initial encounter: Secondary | ICD-10-CM | POA: Diagnosis not present

## 2018-10-02 DIAGNOSIS — M7989 Other specified soft tissue disorders: Secondary | ICD-10-CM | POA: Diagnosis not present

## 2018-10-02 DIAGNOSIS — T63481A Toxic effect of venom of other arthropod, accidental (unintentional), initial encounter: Secondary | ICD-10-CM | POA: Diagnosis not present

## 2018-10-02 DIAGNOSIS — E119 Type 2 diabetes mellitus without complications: Secondary | ICD-10-CM | POA: Diagnosis not present

## 2018-10-02 LAB — CUP PACEART REMOTE DEVICE CHECK
Date Time Interrogation Session: 20200628184116
Implantable Pulse Generator Implant Date: 20190429

## 2018-10-14 NOTE — Progress Notes (Signed)
Carelink Summary Report / Loop Recorder 

## 2018-10-30 ENCOUNTER — Other Ambulatory Visit: Payer: Self-pay | Admitting: Family Medicine

## 2018-10-30 ENCOUNTER — Encounter: Payer: Self-pay | Admitting: Family Medicine

## 2018-10-30 DIAGNOSIS — M25562 Pain in left knee: Secondary | ICD-10-CM

## 2018-10-30 DIAGNOSIS — G8929 Other chronic pain: Secondary | ICD-10-CM

## 2018-10-30 NOTE — Telephone Encounter (Signed)
There was a ortho referral put in ---  Pt never went I have put a referral in for sport med this time----- please call daughter , not the pt

## 2018-11-03 ENCOUNTER — Ambulatory Visit (INDEPENDENT_AMBULATORY_CARE_PROVIDER_SITE_OTHER): Payer: Medicare Other | Admitting: *Deleted

## 2018-11-03 DIAGNOSIS — I639 Cerebral infarction, unspecified: Secondary | ICD-10-CM

## 2018-11-04 LAB — CUP PACEART REMOTE DEVICE CHECK
Date Time Interrogation Session: 20200731184223
Implantable Pulse Generator Implant Date: 20190429

## 2018-11-09 NOTE — Progress Notes (Signed)
Carelink Summary Report / Loop Recorder 

## 2018-11-13 ENCOUNTER — Other Ambulatory Visit: Payer: Self-pay | Admitting: Internal Medicine

## 2018-11-14 ENCOUNTER — Telehealth: Payer: Self-pay

## 2018-11-14 NOTE — Telephone Encounter (Signed)
LM2CB  Pt is overdue for [facility] f/u appt LOV 11-23-16. There are virtual appts available this week. Pt seems to have dementia, unable to understand shy I was calling said to call daughter to schedule appt.

## 2018-11-15 ENCOUNTER — Other Ambulatory Visit: Payer: Self-pay | Admitting: Family Medicine

## 2018-11-29 ENCOUNTER — Other Ambulatory Visit: Payer: Self-pay

## 2018-11-29 ENCOUNTER — Ambulatory Visit (INDEPENDENT_AMBULATORY_CARE_PROVIDER_SITE_OTHER)
Admission: RE | Admit: 2018-11-29 | Discharge: 2018-11-29 | Disposition: A | Discharge status: Home / Self Care | Payer: Medicare Other | Source: Ambulatory Visit | Attending: Family Medicine | Admitting: Family Medicine

## 2018-11-29 ENCOUNTER — Encounter: Payer: Self-pay | Admitting: Family Medicine

## 2018-11-29 ENCOUNTER — Ambulatory Visit (INDEPENDENT_AMBULATORY_CARE_PROVIDER_SITE_OTHER): Payer: Medicare Other | Admitting: Family Medicine

## 2018-11-29 VITALS — BP 148/60 | HR 79 | Ht 64.0 in | Wt 201.0 lb

## 2018-11-29 DIAGNOSIS — M549 Dorsalgia, unspecified: Secondary | ICD-10-CM

## 2018-11-29 DIAGNOSIS — M5416 Radiculopathy, lumbar region: Secondary | ICD-10-CM

## 2018-11-29 DIAGNOSIS — I639 Cerebral infarction, unspecified: Secondary | ICD-10-CM

## 2018-11-29 DIAGNOSIS — M170 Bilateral primary osteoarthritis of knee: Secondary | ICD-10-CM

## 2018-11-29 DIAGNOSIS — M545 Low back pain: Secondary | ICD-10-CM | POA: Diagnosis not present

## 2018-11-29 MED ORDER — AMBULATORY NON FORMULARY MEDICATION: 1.0000 [IU] | Freq: Once | 0 refills | Status: AC

## 2018-11-29 NOTE — Assessment & Plan Note (Signed)
Patient also has some radicular symptoms.  Known moderate to severe spinal stenosis with previous imaging with MRI.  Patient though is on Plavix secondary to stroke.  We need to see if she can hold and have a potential epidural.  Due to patient's other comorbidities and already some mild dementia at baseline I am concerned about adding such medicines at high doses such as gabapentin.  Patient is accompanied with daughter who agrees with this.  We will see if patient can respond to the epidural is safely otherwise follow-up with me 4 to 6 weeks

## 2018-11-29 NOTE — Patient Instructions (Addendum)
Good to see you.  Ice 20 minutes 2 times daily. Usually after activity and before bed. Voltaren gel 2x daily  Tart cherry extract 1200mg  at night Vitamin D 2000 IU daily  Thurmond Butts will call you about custom brace Will get you approved for gel injections [facility] for [phone] See me again in 5-6 weeks

## 2018-11-29 NOTE — Assessment & Plan Note (Addendum)
Patient given injection bilaterally.  Discussed icing regimen and home exercise, which activities to do which wants to avoid.  Patient could be a candidate for Visco supplementation.  Does have what appears to be dementia at baseline.  Follow-up again in 4 to 8 weeks.  Also due to the significant instability of the knee as well as the abnormal thigh to calf ratio patient would need a custom OA brace this would be used lifelong patient wants to hold for avoid any surgical intervention

## 2018-11-29 NOTE — Progress Notes (Signed)
[name] [place], [place] [address] Phone: [phone] Subjective:   I Erin [name] am serving as a Education administrator for Dr. Hulan Saas.  I'm seeing this patient by the request  of:  Ann Held, DO   CC: Bilateral knee pain  OBS:[id number]  Erin [name] is a 73 y.o. female coming in with complaint of bilateral knee and back pain. Stroke Jan. 2019. Back of her legs are also painful. Back pain is radiating down the back of her legs. History of back surgery. Numbness and tingling to the feet. Right leg is worse than left.  Onset- Chronic  Location - lower back and lateral left hip pain, leg pain, groin  Duration-  Character- Aggravating factors- walking  Reliving factors-  Therapies tried- ice, heat, topical, oral Severity-9 out of 10 affecting daily activities, patient is here with daughter who states that patient seems to be a little more unsafe recently.  Seems even with her 4 pronged cane having difficulty with ambulation even short distances.   Patient had x-rays of the right knee done in 2018.  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 Asthma   . CHF (congestive heart failure) [facility])    hospital 11/2013-- [place]  . Chronic kidney disease   . Depression   . Diabetes mellitus ([place])   . GERD (gastroesophageal reflux disease)   . Hyperlipidemia   . Hypertension   . Stroke [facility])    Past Surgical History:  Procedure Laterality Date  . CHOLECYSTECTOMY  2002  . LOOP RECORDER INSERTION N/A 08/01/2017   Procedure: LOOP RECORDER INSERTION;  Surgeon: Constance Haw, MD;  Location: [facility];  Service: Cardiovascular;  Laterality: N/A;  . SPINE SURGERY  aug 2015   [facility]  . TEE WITHOUT CARDIOVERSION N/A 08/01/2017   Procedure: TRANSESOPHAGEAL ECHOCARDIOGRAM (TEE);  Surgeon:  Sanda Klein, MD;  Location: [facility] ENDOSCOPY;  Service: Cardiovascular;  Laterality: N/A;  . TONSILLECTOMY    . TUBAL LIGATION     Social History   Socioeconomic History  . Marital status: Widowed    Spouse name: Not on file  . Number of children: 4  . Years of education: Not on file  . Highest education level: Not on file  Occupational History  . 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 Financial resource strain: Not on file  . Food insecurity    Worry: Not on file    Inability: Not on file  . Transportation needs    Medical: Not on file    Non-medical: Not on file  Tobacco Use  . Smoking status: Former Smoker    Packs/day: 1.50    Years: 20.00    Pack years: 30.00    Types: Cigarettes    Quit date: 08/02/1984    Years since quitting: 34.3  . Smokeless tobacco: Never Used  Substance and Sexual Activity  . Alcohol use: No  . Drug use: No  . Sexual activity: Not Currently    Partners: Male  Lifestyle  . Physical activity    Days per week: Not on file    Minutes per session: Not on file  . Stress: Not on file  Relationships  . Social Herbalist on phone: Not on file    Gets together:  Not on file    Attends religious service: Not on file    Active member of club or organization: Not on file    Attends meetings of clubs or organizations: Not on file    Relationship status: Not on file  Other Topics Concern  . Not on file  Social History Narrative   NO REG EXERCISE   Caffeine use: daily   No Known Allergies Family History  Problem Relation Age of Onset  . Hypertension Mother   . Alzheimer's disease Mother   . Hyperlipidemia Mother   . Heart disease Father        cad  . Asthma Father   . Cancer Father 63       leukemia  . Stroke Father   . Hypertension Father   . Heart disease Sister 50       MI  . Pulmonary fibrosis Sister   . Arthritis Brother   . Heart disease Maternal Uncle   . Stroke Maternal Uncle    . Uterine cancer Paternal Grandmother   . Stomach cancer Paternal Grandfather   . Heart disease Maternal Uncle   . Heart disease Maternal Uncle     Current Outpatient Medications (Endocrine & Metabolic):  .  insulin aspart (NOVOLOG FLEXPEN) 100 UNIT/ML FlexPen, Inject 5 units 3x a day before meals .  Insulin Glargine (BASAGLAR KWIKPEN) 100 UNIT/ML SOPN, Inject 0.16 mLs (16 Units total) into the skin at bedtime.  Current Outpatient Medications (Cardiovascular):  .  atorvastatin (LIPITOR) 40 MG tablet, TAKE 1 TABLET BY MOUTH EVERYDAY AT BEDTIME .  carvedilol (COREG) 6.25 MG tablet, Take 1 tablet (6.25 mg total) by mouth 2 (two) times daily with a meal. .  fenofibrate micronized (LOFIBRA) 200 MG capsule, Take 1 capsule (200 mg total) by mouth daily. Mar[place] Kitchen  torsemide (DEMADEX) 20 MG tablet, TAKE FOUR TABLETS BY MOUTH TWICE DAILY  Current Outpatient Medications (Respiratory):  .  albuterol (PROVENTIL) (2.5 MG/3ML) 0.083% nebulizer solution, Take 3 mLs (2.5 mg total) by nebulization every 6 (six) hours as needed for wheezing or shortness of breath. [place] Kitchen  albuterol (VENTOLIN HFA) 108 (90 Base) MCG/ACT inhaler, Inhale 2 puffs into the lungs every 4 (four) hours as needed for wheezing or shortness of breath.  .  Fluticasone-Salmeterol (ADVAIR) 250-50 MCG/DOSE AEPB, Inhale 1 puff into the lungs 2 (two) times daily.  Current Outpatient Medications (Analgesics):  Marland Kitchen  Acetaminophen (TYLENOL 8 HOUR PO), Take 500 mg by mouth. Mar[place] Kitchen  allopurinol (ZYLOPRIM) 100 MG tablet, Take 1 tablet (100 mg total) by mouth daily.  Current Outpatient Medications (Hematological):  .  clopidogrel (PLAVIX) 75 MG tablet, Take 1 tablet (75 mg total) by mouth daily. .  ferrous sulfate 325 (65 FE) MG tablet, Take 325 mg by mouth.  Current Outpatient Medications (Other):  Mar[place] Kitchen  AMBULATORY NON FORMULARY MEDICATION, 1 Units by Other route once for 1 dose. [place] walker .  busPIRone (BUSPAR) 15 MG tablet, Take 1 tablet (15 mg total) by  mouth 3 (three) times daily. .  cephALEXin (KEFLEX) 500 MG capsule, Take 1 capsule (500 mg total) by mouth 2 (two) times daily. .  diclofenac sodium (VOLTAREN) 1 % GEL, Apply 4 g topically 4 (four) times daily. .  diclofenac sodium (VOLTAREN) 1 % GEL, Apply 4 g topically 4 (four) times daily. .  ergocalciferol (VITAMIN D2) 50000 UNITS capsule, Take 50,000 Units by mouth once a week. Wednesday .  glucose blood test strip, Use 3x a day - One TOUch Ultra .  Insulin Pen Needle (BD PEN NEEDLE NANO U/F) 32G X 4 MM MISC, USE 4 TIMES DAILY .  pantoprazole (PROTONIX) 40 MG tablet, TAKE 1 TABLET BY MOUTH EVERY DAY .  pramipexole (MIRAPEX) 1 MG tablet, TAKE 1 TABLET (1 MG TOTAL) BY MOUTH AT BEDTIME. Mar[place] Kitchen  sertraline (ZOLOFT) 50 MG tablet, TAKE 1 AND 1/2 TABLETS BY MOUTH DAILY    Past medical history, social, surgical and family history all reviewed in electronic medical record.  No pertanent information unless stated regarding to the chief complaint.   Review of Systems:  No headache, visual changes, nausea, vomiting, diarrhea, constipation, dizziness, abdominal pain, skin rash, fevers, chills, night sweats, weight loss, swollen lymph nodes, body aches, joint swelling, chest pain, shortness of breath, mood changes.  Positive muscle aches  Objective  Blood pressure (!) 148/60, pulse 79, height 5\' 4"  (1.626 m), weight 201 lb (91.2 kg), SpO2 97 %.    General: No apparent distress alert patient does appear little confused HEENT: Pupils equal, extraocular movements intact  Respiratory: Patient's speak in full sentences and does not appear short of breath  Cardiovascular: 2+ lower extremity edema, non tender, no erythema  Skin: Warm dry intact with no signs of infection or rash on extremities or on axial skeleton.  Abdomen: Soft nontender mildly distended Neuro: Cranial nerves II through XII are intact, neurovascularly intact in all extremities with 2+ DTRs and 2+ pulses.  Lymph: No lymphadenopathy of  posterior or anterior cervical chain or axillae bilaterally.  Gait severely antalgic MSK: Severe arthritic changes of multiple joints Knee: Bilateral valgus deformity noted. Large thigh to calf ratio.  Tender to palpation over medial and PF joint line.  ROM full in flexion and extension and lower leg rotation. instability with valgus force.  painful patellar compression. Patellar glide with moderate crepitus. Patellar and quadriceps tendons unremarkable. Hamstring and quadriceps strength is normal. Contralateral knee shows of shoulders, elbows, wrist, hip and ankles bilaterally.    Back exam shows the patient does have tender to palpation positive straight leg test with 20 degrees of forward flexion.  Radicular symptoms in the L5 distribution.  Patient does have some mild weakness with dorsiflexion of the foot with 3+ out of 5Strength compared to 4+ out of 5 strength of the contralateral side  After informed written and verbal consent, patient was seated on exam table. Right knee was prepped with alcohol swab and utilizing anterolateral approach, patient's right knee space was injected with 4:1  marcaine 0.5%: Kenalog 40mg /dL. Patient tolerated the procedure well without immediate complications.    After informed written and verbal consent, patient was seated on exam table. Left knee was prepped with alcohol swab and utilizing anterolateral approach, patient's left knee space was injected with 4:1  marcaine 0.5%: Kenalog 40mg /dL. Patient tolerated the procedure well without immediate complications.    After informed written and verbal consent, patient was seated on exam table. Right knee was prepped with alcohol swab and utilizing anterolateral approach, patient's right knee space was injected with 4:1  marcaine 0.5%: Kenalog 40mg /dL. Patient tolerated the procedure well without immediate complications.     Impression and Recommendations:     This case required medical decision making of  moderate complexity. The above documentation has been reviewed and is accurate and complete Lyndal Pulley, DO       Note: This dictation was prepared with Dragon dictation along with smaller phrase technology. Any transcriptional errors that result from this process are unintentional.

## 2018-12-04 ENCOUNTER — Telehealth: Payer: Self-pay | Admitting: *Deleted

## 2018-12-04 NOTE — Telephone Encounter (Signed)
LEFT MESSAGE ON DAUGHTERS VM.    [facility] SEND OVER REQUEST TO HOLD PATIENT PLAVIX 5 DAY PRIOR FOR LUMBAR ESI.  PER DR. Etter Sjogren PATIENT MUST SEE NEUROLOGY.  PATIENT SUPPOSED TO FOLLOW UP FOR STROKE BUT HAS NOT YET.  NEUROLOGY IS TO MAKE DECISION ON TO HOLD PLAVIX OF OR NOT.

## 2018-12-06 ENCOUNTER — Ambulatory Visit (INDEPENDENT_AMBULATORY_CARE_PROVIDER_SITE_OTHER): Payer: Medicare Other | Admitting: *Deleted

## 2018-12-06 DIAGNOSIS — I639 Cerebral infarction, unspecified: Secondary | ICD-10-CM

## 2018-12-07 LAB — CUP PACEART REMOTE DEVICE CHECK
Date Time Interrogation Session: 20200902194041
Implantable Pulse Generator Implant Date: 20190429

## 2018-12-12 ENCOUNTER — Telehealth: Payer: Self-pay

## 2018-12-12 ENCOUNTER — Other Ambulatory Visit: Payer: Self-pay | Admitting: Internal Medicine

## 2018-12-12 NOTE — Telephone Encounter (Signed)
Clearance form fax to Anton at [phone]. Form fax to [phone] twice and confirmed.

## 2018-12-18 ENCOUNTER — Other Ambulatory Visit: Payer: Self-pay

## 2018-12-18 ENCOUNTER — Ambulatory Visit
Admission: RE | Admit: 2018-12-18 | Discharge: 2018-12-18 | Disposition: A | Discharge status: Home / Self Care | Payer: Medicare Other | Source: Ambulatory Visit | Attending: Family Medicine | Admitting: Family Medicine

## 2018-12-18 DIAGNOSIS — M5416 Radiculopathy, lumbar region: Secondary | ICD-10-CM

## 2018-12-18 DIAGNOSIS — M545 Low back pain: Secondary | ICD-10-CM | POA: Diagnosis not present

## 2018-12-18 MED ORDER — IOPAMIDOL (ISOVUE-M 200) INJECTION 41%
1.0000 mL | Freq: Once | INTRAMUSCULAR | Status: AC
  Administered 2018-12-18: 13:00:00 1 mL via EPIDURAL

## 2018-12-18 MED ORDER — METHYLPREDNISOLONE ACETATE 40 MG/ML INJ SUSP (RADIOLOG
120.0000 mg | Freq: Once | INTRAMUSCULAR | Status: AC
  Administered 2018-12-18: 120 mg via EPIDURAL

## 2018-12-18 NOTE — Discharge Instructions (Signed)

## 2018-12-21 NOTE — Progress Notes (Signed)
Carelink Summary Report / Loop Recorder 

## 2018-12-27 ENCOUNTER — Other Ambulatory Visit: Payer: Self-pay

## 2018-12-27 ENCOUNTER — Ambulatory Visit (INDEPENDENT_AMBULATORY_CARE_PROVIDER_SITE_OTHER): Payer: Medicare Other | Admitting: Adult Health

## 2018-12-27 ENCOUNTER — Encounter: Payer: Self-pay | Admitting: Adult Health

## 2018-12-27 VITALS — BP 144/55 | HR 66 | Temp 97.8°F | Ht 65.0 in | Wt 200.8 lb

## 2018-12-27 DIAGNOSIS — R4701 Aphasia: Secondary | ICD-10-CM | POA: Diagnosis not present

## 2018-12-27 DIAGNOSIS — E785 Hyperlipidemia, unspecified: Secondary | ICD-10-CM

## 2018-12-27 DIAGNOSIS — I10 Essential (primary) hypertension: Secondary | ICD-10-CM

## 2018-12-27 DIAGNOSIS — E1165 Type 2 diabetes mellitus with hyperglycemia: Secondary | ICD-10-CM

## 2018-12-27 DIAGNOSIS — I639 Cerebral infarction, unspecified: Secondary | ICD-10-CM

## 2018-12-27 DIAGNOSIS — E1151 Type 2 diabetes mellitus with diabetic peripheral angiopathy without gangrene: Secondary | ICD-10-CM

## 2018-12-27 DIAGNOSIS — IMO0002 Reserved for concepts with insufficient information to code with codable children: Secondary | ICD-10-CM

## 2018-12-27 NOTE — Progress Notes (Signed)
I agree with the above plan 

## 2018-12-27 NOTE — Progress Notes (Signed)
[facility] [address] [place]. [place] [address] [phone]       OFFICE FOLLOW UP NOTE  [name] Date of Birth:  31-Aug-1945 Medical Record Number:  [id number]   Referring MD: Martie Lee Reason for Referral:  Stroke  Chief Complaint  Patient presents with   Follow-up    Room 9, with daughter. Cryptogenic stroke hx f/u "No concerns."      HPI:  Initial visit 05/16/2017 PS: she developed sudden onset of aphasia and right hemiplegia and was taken to [facility]. CT scan of the head showed slight hemorrhagic transformation into large left MCA infarct. NIH stroke scale was 14. She was outside the time window for thrombolysis and was not felt to be candidate for mechanical thrombectomy. She was admitted at [facility] where carotid ultrasound and transcranial Doppler studies were obtained which were unremarkable. Transthoracic echo showed ejection fraction of 65% with some mild diastolic dysfunction but no evidence of patent foramen ovale or cardiac source of embolism. Telemetry monitoring showed normal sinus rhythm. Hemoglobin A1c was elevated at 11.8 and LDL cholesterol was 53 mg percent. She was seen by physical occupational speech therapy and transferred for ongoing rehabilitation needs to a skilled nursing facility and [place].   She is up in improvement in her right-sided strength and is able to walk with a cane and can ambulate even up to 200 feet. She does get short of breath due to her CHF and that is more of limiting factor in a walking. Her speech and language have improved but she still has significant trouble with understanding and speaking. The patient is tolerating Plavix well without bleeding or bruising. Her blood pressure is usually well controlled . She is getting physical occupational and speech therapy. Family is planning to bring her home soon and she will live with her son initially at  least until she recovers and is able to live by herself.  06/14/17 visit: Patient returns today for 4-week follow-up.  She is accompanied today by her 2 daughters.  She continues to have home PT/OT/SLP twice a week along with a home health care nurse 2 times a week.  Continues to take Plavix without increase in bleeding or bruising.  Continues to take Lipitor without side effects of myalgias.  Daughter concerned about recent blood pressure that ranges from systolics 283M-629U.  Blood pressure was checked manually at today's appointment at 140/68.  Daughter questioning BP parameters and was advised systolics should remain between 130-140 from a neurological standpoint.  Daughter will attempt to have nephrologist appointment sooner for possible blood pressure medication adjustments. Most recent A1c 11.2 on 04/27/2017.  Patient will be following up with endocrinologist.  Patient's daughter is also scheduling cardiologist appointment.  Discussed TEE procedure with possible loop recorder placement as this was unable to be done at previous appointment due to patient living at skilled nursing facility.  Per daughter, patient does have mild difficulty with short-term memory but has no other memory complaints.  Patient denies increase or new stroke/TIA symptoms.  Update 12/19/2017: Patient returns today for follow-up appointment and is accompanied by her daughter.  Since prior visit, patient underwent TEE on 08/01/2017 which showed EF of 55 to 60% without cardiac source of embolus or PFO therefore loop recorder was placed.  Loop recorder has not shown atrial fibrillation thus far.  She has completed home therapies and is currently participating in outpatient speech therapy for continued global aphasia.  Daughter has seen some progress in her speech but feels she no longer has right-sided weakness.  He continues to take Plavix without side effects of bleeding or bruising.  Continues to take Lipitor without side effects  myalgias.  Blood pressure today 104/55 and his patient saw PCP prior to this appointment, antihypertensive medications adjusted.  She also had lipid panel and A1c checked today.  Does monitor glucose levels at home and typically run 100-1 45.  She does have an appointment with endocrinology this Friday for continued diabetic management.  She continues to live with her son but is able to complete all ADLs independently.  She has also lost weight and at today's appointment 190 lbs (06/2017 visit 235 lbs) as she has been eating healthier and staying active.  Denies new or worsening stroke/TIA symptoms.  Update 12/27/2018: Erin [name] is being seen today for stroke follow-up accompanied by her daughter.  She has been stable from a neurological standpoint with residual moderate expressive aphasia.  Continues on Plavix and Lipitor for secondary stroke prevention without side effects.  Prior lipid panel showed LDL 56.  Blood pressure today satisfactory at 144/55.  Prior A1c 5.8.  Loop recorder has not shown atrial fibrillation thus far.  She has been experiencing lumbar pain and bilateral knee pain which is lead to use of Rollator walker where she was previously using a cane.  She is currently being followed by Dr. [name] with recent lumbar injections and plans on following up next week.  Denies new or worsening stroke/TIA symptoms.     ROS:  14 system review of systems is positive for speech difficulty, pain, ambulation difficulty and all other systems negative   PMH:  Past Medical History:  Diagnosis Date   Asthma    CHF (congestive heart failure) ([place])    hospital 11/2013-- [place]   Chronic kidney disease    Depression    Diabetes mellitus (HCC)    GERD (gastroesophageal reflux disease)    Hyperlipidemia    Hypertension    Stroke [facility])     Social History:  Social History   Socioeconomic History   Marital status: Widowed    Spouse name: Not on file   Number of children: 4    Years of education: Not on file   Highest education level: Not on file  Occupational History   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, production: [place] resource strain: Not on file   Food insecurity    Worry: Not on file    Inability: Not on file   Transportation needs    Medical: Not on file    Non-medical: Not on file  Tobacco Use   Smoking status: Former Smoker    Packs/day: 1.50    Years: 20.00    Pack years: 30.00    Types: Cigarettes    Quit date: 08/02/1984    Years since quitting: 34.4   Smokeless tobacco: Never Used  Substance and Sexual Activity   Alcohol use: No   Drug use: No   Sexual activity: Not Currently    Partners: Male  Lifestyle   Physical activity    Days per week: Not on file    Minutes per session: Not on file   Stress: Not on file  Relationships   Social connections    Talks on phone: Not on file    Gets together: Not on file    Attends religious service: Not on file  Active member of club or organization: Not on file    Attends meetings of clubs or organizations: Not on file    Relationship status: Not on file   Intimate partner violence    Fear of current or ex partner: Not on file    Emotionally abused: Not on file    Physically abused: Not on file    Forced sexual activity: Not on file  Other Topics Concern   Not on file  Social History Narrative   NO REG EXERCISE   Caffeine use: daily    Medications:   Current Outpatient Medications on File Prior to Visit  Medication Sig Dispense Refill   Acetaminophen (TYLENOL 8 HOUR PO) Take 500 mg by mouth.     albuterol (PROVENTIL) (2.5 MG/3ML) 0.083% nebulizer solution Take 3 mLs (2.5 mg total) by nebulization every 6 (six) hours as needed for wheezing or shortness of breath. 75 mL 12   albuterol (VENTOLIN HFA) 108 (90 Base) MCG/ACT inhaler Inhale 2 puffs into the lungs every 4 (four) hours as needed for wheezing or shortness of breath.       allopurinol (ZYLOPRIM) 100 MG tablet Take 1 tablet (100 mg total) by mouth daily. 90 tablet 1   atorvastatin (LIPITOR) 40 MG tablet TAKE 1 TABLET BY MOUTH EVERYDAY AT BEDTIME 90 tablet 1   busPIRone (BUSPAR) 15 MG tablet Take 1 tablet (15 mg total) by mouth 3 (three) times daily. 270 tablet 1   carvedilol (COREG) 6.25 MG tablet Take 1 tablet (6.25 mg total) by mouth 2 (two) times daily with a meal. 180 tablet 1   cephALEXin (KEFLEX) 500 MG capsule Take 1 capsule (500 mg total) by mouth 2 (two) times daily. 20 capsule 0   clopidogrel (PLAVIX) 75 MG tablet Take 1 tablet (75 mg total) by mouth daily. 90 tablet 1   diclofenac sodium (VOLTAREN) 1 % GEL Apply 4 g topically 4 (four) times daily. 100 g 2   diclofenac sodium (VOLTAREN) 1 % GEL Apply 4 g topically 4 (four) times daily. 100 g 2   ergocalciferol (VITAMIN D2) 50000 UNITS capsule Take 50,000 Units by mouth once a week. Wednesday     fenofibrate micronized (LOFIBRA) 200 MG capsule Take 1 capsule (200 mg total) by mouth daily. 90 capsule 1   ferrous sulfate 325 (65 FE) MG tablet Take 325 mg by mouth.     Fluticasone-Salmeterol (ADVAIR) 250-50 MCG/DOSE AEPB Inhale 1 puff into the lungs 2 (two) times daily.     glucose blood test strip Use 3x a day - One TOUch Ultra 300 each 3   insulin aspart (NOVOLOG FLEXPEN) 100 UNIT/ML FlexPen Inject 5 units 3x a day before meals 45 mL 3   Insulin Glargine (BASAGLAR KWIKPEN) 100 UNIT/ML SOPN INJECT 0.4 MLS (40 UNITS TOTAL) INTO THE SKIN AT BEDTIME. 15 pen 3   Insulin Pen Needle (BD PEN NEEDLE NANO U/F) 32G X 4 MM MISC USE 4 TIMES DAILY 300 each 3   pantoprazole (PROTONIX) 40 MG tablet TAKE 1 TABLET BY MOUTH EVERY DAY 90 tablet 0   pramipexole (MIRAPEX) 1 MG tablet TAKE 1 TABLET (1 MG TOTAL) BY MOUTH AT BEDTIME. 90 tablet 1   sertraline (ZOLOFT) 50 MG tablet TAKE 1 AND 1/2 TABLETS BY MOUTH DAILY 135 tablet 1   torsemide (DEMADEX) 20 MG tablet TAKE FOUR TABLETS BY MOUTH TWICE DAILY      EPINEPHrine 0.3 mg/0.3 mL IJ SOAJ injection PLEASE SEE ATTACHED FOR DETAILED DIRECTIONS  No current facility-administered medications on file prior to visit.     Allergies:   Allergies  Allergen Reactions   Bee Venom    Today's Vitals   12/27/18 0944  BP: (!) 144/55  Pulse: 66  Temp: 97.8 F (36.6 C)  Weight: 200 lb 12.8 oz (91.1 kg)  Height: 5\' 5"  (1.651 m)   Body mass index is 33.41 kg/m.   Physical Exam General: Obese pleasant elderly Caucasian lady seated, in no evident distress Head: head normocephalic and atraumatic.   Neck: supple with no carotid or supraclavicular bruits Cardiovascular: regular rate and rhythm, no murmurs Musculoskeletal: no deformity Skin:  no rash/petichiae Vascular:  Normal pulses all extremities  Neurologic Exam Mental Status: Awake and fully alert.  Moderate expressive aphasia.  Difficulty naming objects.  Able to speak in short sentences with only a few errors.  Oriented to place and time.  Remote memory intact. Cranial Nerves: Pupils equal, briskly reactive to light. Extraocular movements full without nystagmus. Hearing intact. Facial sensation intact. Face, tongue, palate moves normally and symmetrically.   Motor: Normal bulk and tone.  Normal strength bilateral upper extremities.  Difficulty assessing bilateral lower extremities due to increased pain in knees and back Sensory.: intact to touch , pinprick , position and vibratory sensation.  Coordination: Rapid alternating movements normal in all extremities. Finger-to-nose performed accurately bilaterally and heel-to-shin unable to perform due to lower extremity pain. Gait and Station: Arises from chair with mild difficulty. Stance is slightly hunched.  Gait abnormality with use of rolling walker secondary to lumbar and knee pain Reflexes: 1+ and symmetric. Toes downgoing.     IMAGING/LABS:  ECHO TEE 08/01/2017 Study Conclusions - Left ventricle: The cavity size was normal. Wall  thickness was   normal. Systolic function was normal. The estimated ejection   fraction was in the range of 55% to 60%. Wall motion was normal;   there were no regional wall motion abnormalities. Features are   consistent with a pseudonormal left ventricular filling pattern,   with concomitant abnormal relaxation and increased filling   pressure (grade 2 diastolic dysfunction). - Left atrium: The atrium was mildly dilated. No evidence of   thrombus in the atrial cavity or appendage. No spontaneous echo   contrast was observed. - Right atrium: No evidence of thrombus in the atrial cavity or   appendage. - Atrial septum: There was increased thickness of the septum,   consistent with lipomatous hypertrophy. No defect or patent   foramen ovale was identified. Echo contrast study showed no   right-to-left atrial level shunt, at baseline or with   provocation. - Pulmonary arteries: Systolic pressure was mildly increased. PA   peak pressure: 37 mm Hg (S).  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  Underwent TEE which was negative for PFO or cardiac source of embolus therefore loop recorder placed on 08/01/2017.  Loop recorder is not shown atrial fibrillation thus far.  Residual deficits of moderate expressive aphasia.   PLAN: -Continue clopidogrel 75 mg daily  and lipitor 40mg   for secondary stroke prevention -f/u with PCP for HLD and HTN management -f/u with endocrinologist for diabetic management -Continue to monitor loop recorder for potential atrial fibrillation -Advised to continue to follow with Dr. [name] with appointment scheduled on 01/04/2019 regarding ongoing lumbar pain with radiculopathy and bilateral  knee pain which has been limiting and interfering with daily  functioning -Advised to continue to stay active and maintain a healthy diet -Continue to monitor blood pressure at home due to change in HTN management -Maintain strict control of hypertension with blood pressure goal below 130/90, diabetes with hemoglobin A1c goal below 6.5% and cholesterol with LDL cholesterol (bad cholesterol) goal below 70 mg/dL. I also advised the patient to eat a healthy diet with plenty of whole grains, cereals, fruits and vegetables, exercise regularly and maintain ideal body weight.  Overall stable from stroke standpoint recommend follow-up as needed   Greater than 50% time during this 25 minute visit was spent on counseling and coordination of care about HTN, HLD, and DM (risk factors), discussion regarding residual deficits, decreased functional mobility secondary to pain and ongoing follow-up for further treatment options.  Answered all questions to patient and daughter satisfaction.  Frann Rider, AGNP-BC  [facility] [address] [place], [place] [address]  Phone [phone] Fax [phone]

## 2018-12-27 NOTE — Patient Instructions (Addendum)
Continue clopidogrel 75 mg daily  and Lipitor for secondary stroke prevention  Continue to follow up with PCP regarding cholesterol, blood pressure and diabetes management   Continue to follow with Dr. [name] with back and knee pain  Your loop recorder will continue to be monitored and you will be notified with any abnormal findings   Continue to monitor blood pressure at home  Maintain strict control of hypertension with blood pressure goal below 130/90, diabetes with hemoglobin A1c goal below 6.5% and cholesterol with LDL cholesterol (bad cholesterol) goal below 70 mg/dL. I also advised the patient to eat a healthy diet with plenty of whole grains, cereals, fruits and vegetables, exercise regularly and maintain ideal body weight.         Thank you for coming to see [place] at [facility]. I hope we have been able to provide you high quality care today.  You may receive a patient satisfaction survey over the next few weeks. We would appreciate your feedback and comments so that we may continue to improve ourselves and the health of our patients.

## 2018-12-28 ENCOUNTER — Telehealth: Payer: Self-pay

## 2018-12-28 NOTE — Telephone Encounter (Signed)
Left message for patient to regarding disconnected monitor

## 2018-12-29 ENCOUNTER — Telehealth: Payer: Self-pay

## 2018-12-29 NOTE — Telephone Encounter (Signed)
Left message for patient regarding disconnected monitor

## 2019-01-01 ENCOUNTER — Other Ambulatory Visit: Payer: Self-pay | Admitting: Family Medicine

## 2019-01-01 DIAGNOSIS — F32A Depression, unspecified: Secondary | ICD-10-CM

## 2019-01-01 DIAGNOSIS — F329 Major depressive disorder, single episode, unspecified: Secondary | ICD-10-CM

## 2019-01-03 ENCOUNTER — Ambulatory Visit: Payer: Medicare Other | Admitting: Family Medicine

## 2019-01-04 ENCOUNTER — Ambulatory Visit (INDEPENDENT_AMBULATORY_CARE_PROVIDER_SITE_OTHER): Payer: Medicare Other | Admitting: Family Medicine

## 2019-01-04 ENCOUNTER — Encounter: Payer: Self-pay | Admitting: Family Medicine

## 2019-01-04 ENCOUNTER — Other Ambulatory Visit: Payer: Self-pay

## 2019-01-04 DIAGNOSIS — E559 Vitamin D deficiency, unspecified: Secondary | ICD-10-CM | POA: Diagnosis not present

## 2019-01-04 DIAGNOSIS — I129 Hypertensive chronic kidney disease with stage 1 through stage 4 chronic kidney disease, or unspecified chronic kidney disease: Secondary | ICD-10-CM | POA: Diagnosis not present

## 2019-01-04 DIAGNOSIS — D631 Anemia in chronic kidney disease: Secondary | ICD-10-CM | POA: Diagnosis not present

## 2019-01-04 DIAGNOSIS — E1122 Type 2 diabetes mellitus with diabetic chronic kidney disease: Secondary | ICD-10-CM | POA: Diagnosis not present

## 2019-01-04 DIAGNOSIS — M170 Bilateral primary osteoarthritis of knee: Secondary | ICD-10-CM

## 2019-01-04 DIAGNOSIS — Z8673 Personal history of transient ischemic attack (TIA), and cerebral infarction without residual deficits: Secondary | ICD-10-CM

## 2019-01-04 DIAGNOSIS — N183 Chronic kidney disease, stage 3 unspecified: Secondary | ICD-10-CM | POA: Diagnosis not present

## 2019-01-04 DIAGNOSIS — N1832 Chronic kidney disease, stage 3b: Secondary | ICD-10-CM | POA: Diagnosis not present

## 2019-01-04 NOTE — Assessment & Plan Note (Signed)
Viscosupplementation given today.  Tolerated the procedure well.  Am hoping the patient will have some relief for 6 to 12 months.  Discussed with her with due to the severity of the arthritis likely will be not long-term.  Increase activity slowly otherwise.  Encourage weight loss.  Follow-up again in 6 to 8 weeks

## 2019-01-04 NOTE — Progress Notes (Signed)
[name] [address], [place] [address] Phone: [phone] Subjective:   I Erin [name] am serving as a Education administrator for Dr. Hulan Saas.   CC: Knee neck pain follow-up  TOI:[id number]  Erin [name] is a 73 y.o. female coming in with complaint of knee and back pain. States she is still hurting but has been moving around better. Hip and knee is still painful.  Patient is accompanied with daughter in the room helps patient secondary to some dementia.   Bilateral knee injections were given November 29, 2018 states that the pain seems to be worsening to a certain degree.  Patient underwent an epidural December 18, 2018 of the right patient states no improvement but daughter has noticed that she has been ambulating better.  Past Medical History:  Diagnosis Date   Asthma    CHF (congestive heart failure) ([place])    hospital 11/2013-- [place]   Chronic kidney disease    Depression    Diabetes mellitus ([place])    GERD (gastroesophageal reflux disease)    Hyperlipidemia    Hypertension    Stroke [facility])    Past Surgical History:  Procedure Laterality Date   CHOLECYSTECTOMY  2002   LOOP RECORDER INSERTION N/A 08/01/2017   Procedure: LOOP RECORDER INSERTION;  Surgeon: Constance Haw, MD;  Location: [facility];  Service: Cardiovascular;  Laterality: N/A;   SPINE SURGERY  aug 2015   [facility]   TEE WITHOUT CARDIOVERSION N/A 08/01/2017   Procedure: TRANSESOPHAGEAL ECHOCARDIOGRAM (TEE);  Surgeon: Sanda Klein, MD;  Location: [facility] ENDOSCOPY;  Service: Cardiovascular;  Laterality: N/A;   TONSILLECTOMY     TUBAL LIGATION     Social History   Socioeconomic History   Marital status: Widowed    Spouse name: Not on file   Number of children: 4   Years of education: Not on file   Highest education level: Not on file  Occupational History   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, production: [place] resource strain: Not on file   Food insecurity    Worry: Not on file    Inability: Not on file   Transportation needs    Medical: Not on file    Non-medical: Not on file  Tobacco Use   Smoking status: Former Smoker    Packs/day: 1.50    Years: 20.00    Pack years: 30.00    Types: Cigarettes    Quit date: 08/02/1984    Years since quitting: 34.4   Smokeless tobacco: Never Used  Substance and Sexual Activity   Alcohol use: No   Drug use: No   Sexual activity: Not Currently    Partners: Male  Lifestyle   Physical activity    Days per week: Not on file    Minutes per session: Not on file   Stress: Not on file  Relationships   Social connections    Talks on phone: Not on file    Gets together: Not on file    Attends religious service: Not on file    Active member of club or organization: Not on file    Attends meetings of clubs or organizations: Not on file    Relationship status: Not on file  Other Topics Concern   Not on file  Social History Narrative   NO REG EXERCISE   Caffeine use: daily   Allergies  Allergen Reactions  Bee Venom    Family History  Problem Relation Age of Onset   Hypertension Mother    Alzheimer's disease Mother    Hyperlipidemia Mother    Heart disease Father        cad   Asthma Father    Cancer Father 71       leukemia   Stroke Father    Hypertension Father    Heart disease Sister 63       MI   Pulmonary fibrosis Sister    Arthritis Brother    Heart disease Maternal Uncle    Stroke Maternal Uncle    Uterine cancer Paternal Grandmother    Stomach cancer Paternal Grandfather    Heart disease Maternal Uncle    Heart disease Maternal Uncle     Current Outpatient Medications (Endocrine & Metabolic):    insulin aspart (NOVOLOG FLEXPEN) 100 UNIT/ML FlexPen, Inject 5 units 3x a day before meals   Insulin Glargine (BASAGLAR KWIKPEN) 100 UNIT/ML SOPN, INJECT 0.4 MLS (40 UNITS TOTAL) INTO  THE SKIN AT BEDTIME.  Current Outpatient Medications (Cardiovascular):    atorvastatin (LIPITOR) 40 MG tablet, TAKE 1 TABLET BY MOUTH EVERYDAY AT BEDTIME   carvedilol (COREG) 6.25 MG tablet, Take 1 tablet (6.25 mg total) by mouth 2 (two) times daily with a meal.   EPINEPHrine 0.3 mg/0.3 mL IJ SOAJ injection, PLEASE SEE ATTACHED FOR DETAILED DIRECTIONS   fenofibrate micronized (LOFIBRA) 200 MG capsule, Take 1 capsule (200 mg total) by mouth daily.   torsemide (DEMADEX) 20 MG tablet, TAKE FOUR TABLETS BY MOUTH TWICE DAILY  Current Outpatient Medications (Respiratory):    albuterol (PROVENTIL) (2.5 MG/3ML) 0.083% nebulizer solution, Take 3 mLs (2.5 mg total) by nebulization every 6 (six) hours as needed for wheezing or shortness of breath.   albuterol (VENTOLIN HFA) 108 (90 Base) MCG/ACT inhaler, Inhale 2 puffs into the lungs every 4 (four) hours as needed for wheezing or shortness of breath.    Fluticasone-Salmeterol (ADVAIR) 250-50 MCG/DOSE AEPB, Inhale 1 puff into the lungs 2 (two) times daily.  Current Outpatient Medications (Analgesics):    Acetaminophen (TYLENOL 8 HOUR PO), Take 500 mg by mouth.   allopurinol (ZYLOPRIM) 100 MG tablet, Take 1 tablet (100 mg total) by mouth daily.  Current Outpatient Medications (Hematological):    clopidogrel (PLAVIX) 75 MG tablet, Take 1 tablet (75 mg total) by mouth daily.   ferrous sulfate 325 (65 FE) MG tablet, Take 325 mg by mouth.  Current Outpatient Medications (Other):    busPIRone (BUSPAR) 15 MG tablet, Take 1 tablet (15 mg total) by mouth 3 (three) times daily.   cephALEXin (KEFLEX) 500 MG capsule, Take 1 capsule (500 mg total) by mouth 2 (two) times daily.   diclofenac sodium (VOLTAREN) 1 % GEL, Apply 4 g topically 4 (four) times daily.   diclofenac sodium (VOLTAREN) 1 % GEL, Apply 4 g topically 4 (four) times daily.   ergocalciferol (VITAMIN D2) 50000 UNITS capsule, Take 50,000 Units by mouth once a week. Wednesday    glucose blood test strip, Use 3x a day - One TOUch Ultra   Insulin Pen Needle (BD PEN NEEDLE NANO U/F) 32G X 4 MM MISC, USE 4 TIMES DAILY   pantoprazole (PROTONIX) 40 MG tablet, TAKE 1 TABLET BY MOUTH EVERY DAY   pramipexole (MIRAPEX) 1 MG tablet, TAKE 1 TABLET (1 MG TOTAL) BY MOUTH AT BEDTIME.   sertraline (ZOLOFT) 50 MG tablet, TAKE 1 AND 1/2 TABLETS DAILY BY MOUTH    Past medical  history, social, surgical and family history all reviewed in electronic medical record.  No pertanent information unless stated regarding to the chief complaint.   Review of Systems:  No headache, visual changes, nausea, vomiting, diarrhea, constipation, dizziness, abdominal pain, skin rash, fevers, chills, night sweats, weight loss, swollen lymph nodes, chest pain, shortness of breath, mood changes.  Positive muscle aches, body aches  Objective  Blood pressure (!) 160/60, pulse 81, height 5\' 5"  (1.651 m), weight 202 lb (91.6 kg), SpO2 97 %.    General: No apparent distress alert not oriented mood and affect normal, dressed appropriately.  HEENT: Pupils equal, extraocular movements intact  Respiratory: Patient's speak in full sentences and does not appear short of breath  Cardiovascular: 1+ lower extremity edema, tender, no erythema  Skin: Warm dry intact with no signs of infection or rash on extremities or on axial skeleton.  Abdomen: Soft nontender  Neuro: Cranial nerves II through XII are intact, neurovascularly intact in all extremities with 2+ DTRs and 2+ pulses.  Lymph: No lymphadenopathy of posterior or anterior cervical chain or axillae bilaterally.  Gait antalgic.  Walking with the aid of a walker MSK:  tender with limited range of motion and stability and symmetric strength and tone of shoulders, elbows, wrist, hip, and ankles bilaterally.  Back Exam:  Inspection: Loss of lordosis Motion: Unable to do testing secondary to patient being in the chair SLR laying: Negative  XSLR laying: Negative    Palpable tenderness: Diffuse tenderness to even light palpation in the paraspinal musculature. FABER: Unable to do secondary to patient's body habitus. Sensory change: Gross sensation intact to all lumbar and sacral dermatomes.  Reflexes: 2+ at both patellar tendons, 2+ at achilles tendons, Babinski's downgoing.      Knee: Bilateral valgus deformity noted. Large thigh to calf ratio.  Tender to palpation over medial and PF joint line.  ROM full in flexion and extension and lower leg rotation. instability with valgus force.  painful patellar compression. Patellar glide with moderate crepitus. Patellar and quadriceps tendons unremarkable. Hamstring and quadriceps strength is normal.  After informed written and verbal consent, patient was seated on exam table. Right knee was prepped with alcohol swab and utilizing anterolateral approach, patient's right knee space was injected with 22 mg/mL  of Monovisc (sodium hyaluronate) in a prefilled syringe was injected easily into the knee through a 22-gauge needle..Patient tolerated the procedure well without immediate complications.  After informed written and verbal consent, patient was seated on exam table. Left knee was prepped with alcohol swab and utilizing anterolateral approach, patient's left knee space was injected with 22 mg/mL of Monovisc (sodium hyaluronate) in a prefilled syringe was injected easily into the knee through a 22-gauge needle..Patient tolerated the procedure well without immediate complications.   Impression and Recommendations:     This case required medical decision making of moderate complexity. The above documentation has been reviewed and is accurate and complete Lyndal Pulley, DO       Note: This dictation was prepared with Dragon dictation along with smaller phrase technology. Any transcriptional errors that result from this process are unintentional.

## 2019-01-04 NOTE — Patient Instructions (Signed)
Good to see you Send message in 2 weeks  If not better we will do epidural See me again in 5 weeks

## 2019-01-05 DIAGNOSIS — J4520 Mild intermittent asthma, uncomplicated: Secondary | ICD-10-CM | POA: Diagnosis not present

## 2019-01-05 DIAGNOSIS — G4733 Obstructive sleep apnea (adult) (pediatric): Secondary | ICD-10-CM | POA: Diagnosis not present

## 2019-01-05 DIAGNOSIS — J310 Chronic rhinitis: Secondary | ICD-10-CM | POA: Diagnosis not present

## 2019-01-05 DIAGNOSIS — R5383 Other fatigue: Secondary | ICD-10-CM | POA: Diagnosis not present

## 2019-01-08 ENCOUNTER — Ambulatory Visit (INDEPENDENT_AMBULATORY_CARE_PROVIDER_SITE_OTHER): Payer: Medicare Other | Admitting: *Deleted

## 2019-01-08 DIAGNOSIS — I639 Cerebral infarction, unspecified: Secondary | ICD-10-CM | POA: Diagnosis not present

## 2019-01-08 DIAGNOSIS — I509 Heart failure, unspecified: Secondary | ICD-10-CM

## 2019-01-09 LAB — CUP PACEART REMOTE DEVICE CHECK
Date Time Interrogation Session: 20201005193853
Implantable Pulse Generator Implant Date: 20190429

## 2019-01-17 NOTE — Progress Notes (Signed)
Carelink Summary Report / Loop Recorder 

## 2019-01-22 ENCOUNTER — Other Ambulatory Visit: Payer: Self-pay | Admitting: Family Medicine

## 2019-01-22 DIAGNOSIS — I10 Essential (primary) hypertension: Secondary | ICD-10-CM

## 2019-01-23 ENCOUNTER — Other Ambulatory Visit: Payer: Self-pay

## 2019-01-23 ENCOUNTER — Encounter: Payer: Self-pay | Admitting: Family Medicine

## 2019-01-23 ENCOUNTER — Ambulatory Visit (INDEPENDENT_AMBULATORY_CARE_PROVIDER_SITE_OTHER): Payer: Medicare Other | Admitting: Family Medicine

## 2019-01-23 VITALS — BP 148/70 | HR 70 | Temp 97.6°F | Resp 18 | Ht 65.0 in | Wt 203.4 lb

## 2019-01-23 DIAGNOSIS — N189 Chronic kidney disease, unspecified: Secondary | ICD-10-CM | POA: Diagnosis not present

## 2019-01-23 DIAGNOSIS — I639 Cerebral infarction, unspecified: Secondary | ICD-10-CM | POA: Diagnosis not present

## 2019-01-23 DIAGNOSIS — Z794 Long term (current) use of insulin: Secondary | ICD-10-CM

## 2019-01-23 DIAGNOSIS — E785 Hyperlipidemia, unspecified: Secondary | ICD-10-CM | POA: Diagnosis not present

## 2019-01-23 DIAGNOSIS — E559 Vitamin D deficiency, unspecified: Secondary | ICD-10-CM | POA: Diagnosis not present

## 2019-01-23 DIAGNOSIS — F320 Major depressive disorder, single episode, mild: Secondary | ICD-10-CM

## 2019-01-23 DIAGNOSIS — Z23 Encounter for immunization: Secondary | ICD-10-CM

## 2019-01-23 DIAGNOSIS — E1169 Type 2 diabetes mellitus with other specified complication: Secondary | ICD-10-CM | POA: Diagnosis not present

## 2019-01-23 DIAGNOSIS — E119 Type 2 diabetes mellitus without complications: Secondary | ICD-10-CM | POA: Insufficient documentation

## 2019-01-23 DIAGNOSIS — I10 Essential (primary) hypertension: Secondary | ICD-10-CM | POA: Diagnosis not present

## 2019-01-23 LAB — LIPID PANEL
Cholesterol: 138 mg/dL (ref 0–200)
HDL: 46.9 mg/dL (ref >39.00)
LDL Cholesterol: 68 mg/dL (ref 0–99)
NonHDL: 91.47
Total CHOL/HDL Ratio: 3
Triglycerides: 118 mg/dL (ref 0.0–149.0)
VLDL: 23.6 mg/dL (ref 0.0–40.0)

## 2019-01-23 LAB — VITAMIN D 25 HYDROXY (VIT D DEFICIENCY, FRACTURES): VITD: 32.3 ng/mL (ref 30.00–100.00)

## 2019-01-23 LAB — COMPREHENSIVE METABOLIC PANEL
ALT: 17 U/L (ref 0–35)
AST: 28 U/L (ref 0–37)
Albumin: 4.5 g/dL (ref 3.5–5.2)
Alkaline Phosphatase: 53 U/L (ref 39–117)
BUN: 51 mg/dL — ABNORMAL HIGH (ref 6–23)
CO2: 27 mEq/L (ref 19–32)
Calcium: 9.3 mg/dL (ref 8.4–10.5)
Chloride: 101 mEq/L (ref 96–112)
Creatinine, Ser: 1.54 mg/dL — ABNORMAL HIGH (ref 0.40–1.20)
GFR: 32.95 mL/min — ABNORMAL LOW (ref >60.00)
Glucose, Bld: 90 mg/dL (ref 70–99)
Potassium: 4.1 mEq/L (ref 3.5–5.1)
Sodium: 138 mEq/L (ref 135–145)
Total Bilirubin: 0.7 mg/dL (ref 0.2–1.2)
Total Protein: 6.8 g/dL (ref 6.0–8.3)

## 2019-01-23 LAB — CBC WITH DIFFERENTIAL/PLATELET
Basophils Absolute: 0.1 10*3/uL (ref 0.0–0.1)
Basophils Relative: 0.9 % (ref 0.0–3.0)
Eosinophils Absolute: 0 10*3/uL (ref 0.0–0.7)
Eosinophils Relative: 0.5 % (ref 0.0–5.0)
HCT: 29.8 % — ABNORMAL LOW (ref 36.0–46.0)
Hemoglobin: 9.8 g/dL — ABNORMAL LOW (ref 12.0–15.0)
Lymphocytes Relative: 11.3 % — ABNORMAL LOW (ref 12.0–46.0)
Lymphs Abs: 0.7 10*3/uL (ref 0.7–4.0)
MCHC: 32.7 g/dL (ref 30.0–36.0)
MCV: 91.4 fl (ref 78.0–100.0)
Monocytes Absolute: 0.4 10*3/uL (ref 0.1–1.0)
Monocytes Relative: 5.6 % (ref 3.0–12.0)
Neutro Abs: 5.1 10*3/uL (ref 1.4–7.7)
Neutrophils Relative %: 81.7 % — ABNORMAL HIGH (ref 43.0–77.0)
Platelets: 114 10*3/uL — ABNORMAL LOW (ref 150.0–400.0)
RBC: 3.27 Mil/uL — ABNORMAL LOW (ref 3.87–5.11)
RDW: 17.3 % — ABNORMAL HIGH (ref 11.5–15.5)
WBC: 6.3 10*3/uL (ref 4.0–10.5)

## 2019-01-23 LAB — HEMOGLOBIN A1C: Hgb A1c MFr Bld: 5.9 % (ref 4.6–6.5)

## 2019-01-23 NOTE — Assessment & Plan Note (Signed)
Per endo Will check hgba1c -- since last ov with endo was virtual

## 2019-01-23 NOTE — Assessment & Plan Note (Signed)
Per nephro

## 2019-01-23 NOTE — Assessment & Plan Note (Signed)
Tolerating statin, encouraged heart healthy diet, avoid trans fats, minimize simple carbs and saturated fats. Increase exercise as tolerated 

## 2019-01-23 NOTE — Assessment & Plan Note (Signed)
Stable con't zoloft

## 2019-01-23 NOTE — Progress Notes (Signed)
Patient ID: Erin [name], female    DOB: 19-May-1945  Age: 73 y.o. MRN: [id number]    Subjective:  Subjective  HPI Erin [name] presents for f/u chol , bp and dm.  Her daughter is with her and provides history.   No other complaints.    HYPERTENSION   Blood pressure range-not checking   Chest pain- no      Dyspnea- no Lightheadedness- no   Edema- no  Other side effects - no   Medication compliance: good Low salt diet- yes    DIABETES    Blood Sugar ranges-checks tid  Polyuria- no New Visual problems- no  Hypoglycemic symptoms- no  Other side effects-no Medication compliance - good Last eye exam- due    HYPERLIPIDEMIA  Medication compliance- good RUQ pain- no  Muscle aches- no Other side effects-no     Review of Systems  Constitutional: Negative for appetite change, diaphoresis, fatigue and unexpected weight change.  Eyes: Negative for pain, redness and visual disturbance.  Respiratory: Negative for cough, chest tightness, shortness of breath and wheezing.   Cardiovascular: Negative for chest pain, palpitations and leg swelling.  Endocrine: Negative for cold intolerance, heat intolerance, polydipsia, polyphagia and polyuria.  Genitourinary: Negative for difficulty urinating, dysuria and frequency.  Neurological: Negative for dizziness, light-headedness, numbness and headaches.    History Past Medical History:  Diagnosis Date  . Asthma   . CHF (congestive heart failure) [facility])    hospital 11/2013-- [place]  . Chronic kidney disease   . Depression   . Diabetes mellitus ([place])   . GERD (gastroesophageal reflux disease)   . Hyperlipidemia   . Hypertension   . Stroke [facility])     She has a past surgical history that includes Cholecystectomy (2002); Tubal ligation; Tonsillectomy; Spine surgery (aug 2015); LOOP RECORDER INSERTION (N/A, 08/01/2017); and TEE without cardioversion (N/A, 08/01/2017).   Her family history includes Alzheimer's disease in her mother;  Arthritis in her brother; Asthma in her father; Cancer (age of onset: 70) in her father; Heart disease in her father, maternal uncle, maternal uncle, and maternal uncle; Heart disease (age of onset: 76) in her sister; Hyperlipidemia in her mother; Hypertension in her father and mother; Pulmonary fibrosis in her sister; Stomach cancer in her paternal grandfather; Stroke in her father and maternal uncle; Uterine cancer in her paternal grandmother.She reports that she quit smoking about 34 years ago. Her smoking use included cigarettes. She has a 30.00 pack-year smoking history. She has never used smokeless tobacco. She reports that she does not drink alcohol or use drugs.  Current Outpatient Medications on File Prior to Visit  Medication Sig Dispense Refill  . Acetaminophen (TYLENOL 8 HOUR PO) Take 500 mg by mouth.    Marland Kitchen albuterol (PROVENTIL) (2.5 MG/3ML) 0.083% nebulizer solution Take 3 mLs (2.5 mg total) by nebulization every 6 (six) hours as needed for wheezing or shortness of breath. 75 mL 12  . albuterol (VENTOLIN HFA) 108 (90 Base) MCG/ACT inhaler Inhale 2 puffs into the lungs every 4 (four) hours as needed for wheezing or shortness of breath.     . allopurinol (ZYLOPRIM) 100 MG tablet TAKE 1 TABLET BY MOUTH EVERY DAY 90 tablet 1  . atorvastatin (LIPITOR) 40 MG tablet TAKE 1 TABLET BY MOUTH EVERYDAY AT BEDTIME 90 tablet 1  . busPIRone (BUSPAR) 15 MG tablet TAKE 1 TABLET BY MOUTH 3 TIMES DAILY. 270 tablet 1  . carvedilol (COREG) 6.25 MG tablet TAKE 1 TABLET (6.25 MG TOTAL) BY  MOUTH 2 (TWO) TIMES DAILY WITH A MEAL. 180 tablet 1  . clopidogrel (PLAVIX) 75 MG tablet TAKE 1 TABLET BY MOUTH EVERY DAY 90 tablet 1  . diclofenac sodium (VOLTAREN) 1 % GEL Apply 4 g topically 4 (four) times daily. 100 g 2  . EPINEPHrine 0.3 mg/0.3 mL IJ SOAJ injection PLEASE SEE ATTACHED FOR DETAILED DIRECTIONS    . ergocalciferol (VITAMIN D2) 50000 UNITS capsule Take 50,000 Units by mouth once a week. Wednesday    .  fenofibrate micronized (LOFIBRA) 200 MG capsule Take 1 capsule (200 mg total) by mouth daily. 90 capsule 1  . ferrous sulfate 325 (65 FE) MG tablet Take 325 mg by mouth.    . Fluticasone-Salmeterol (ADVAIR) 250-50 MCG/DOSE AEPB Inhale 1 puff into the lungs 2 (two) times daily.    Marland Kitchen glucose blood test strip Use 3x a day - One TOUch Ultra 300 each 3  . Insulin Glargine (BASAGLAR KWIKPEN) 100 UNIT/ML SOPN INJECT 0.4 MLS (40 UNITS TOTAL) INTO THE SKIN AT BEDTIME. 15 pen 3  . Insulin Pen Needle (BD PEN NEEDLE NANO U/F) 32G X 4 MM MISC USE 4 TIMES DAILY 300 each 3  . pantoprazole (PROTONIX) 40 MG tablet TAKE 1 TABLET BY MOUTH EVERY DAY 90 tablet 0  . pramipexole (MIRAPEX) 1 MG tablet TAKE 1 TABLET (1 MG TOTAL) BY MOUTH AT BEDTIME. 90 tablet 1  . sertraline (ZOLOFT) 50 MG tablet TAKE 1 AND 1/2 TABLETS DAILY BY MOUTH 135 tablet 1  . torsemide (DEMADEX) 20 MG tablet TAKE FOUR TABLETS BY MOUTH TWICE DAILY    . cephALEXin (KEFLEX) 500 MG capsule Take 1 capsule (500 mg total) by mouth 2 (two) times daily. (Patient not taking: Reported on 01/23/2019) 20 capsule 0  . diclofenac sodium (VOLTAREN) 1 % GEL Apply 4 g topically 4 (four) times daily. (Patient not taking: Reported on 01/23/2019) 100 g 2  . insulin aspart (NOVOLOG FLEXPEN) 100 UNIT/ML FlexPen Inject 5 units 3x a day before meals (Patient not taking: Reported on 01/23/2019) 45 mL 3   No current facility-administered medications on file prior to visit.      Objective:  Objective  Physical Exam Vitals signs and nursing note reviewed.  Constitutional:      Appearance: She is well-developed.  HENT:     Head: Normocephalic and atraumatic.  Eyes:     Conjunctiva/sclera: Conjunctivae normal.  Neck:     Musculoskeletal: Normal range of motion and neck supple.     Thyroid: No thyromegaly.     Vascular: No carotid bruit or JVD.  Cardiovascular:     Rate and Rhythm: Normal rate and regular rhythm.     Heart sounds: Normal heart sounds. No murmur.   Pulmonary:     Effort: Pulmonary effort is normal. No respiratory distress.     Breath sounds: Normal breath sounds. No wheezing or rales.  Chest:     Chest wall: No tenderness.  Neurological:     Mental Status: She is alert. She is disoriented.     Comments: Walks with walker    BP (!) 148/70 (BP Location: Left Arm, Patient Position: Sitting, Cuff Size: Normal)   Pulse 70   Temp 97.6 F (36.4 C) (Temporal)   Resp 18   Ht 5\' 5"  (1.651 m)   Wt 203 lb 6.4 oz (92.3 kg)   SpO2 100%   BMI 33.85 kg/m  Wt Readings from Last 3 Encounters:  01/23/19 203 lb 6.4 oz (92.3 kg)  01/04/19 202  lb (91.6 kg)  12/27/18 200 lb 12.8 oz (91.1 kg)     Lab Results  Component Value Date   WBC 16.2 (H) 10/14/2016   HGB 10.4 (L) 10/14/2016   HCT 32.0 (L) 10/14/2016   PLT 72.0 (L) 10/14/2016   GLUCOSE 86 08/31/2018   CHOL 125 08/31/2018   TRIG 117.0 08/31/2018   HDL 46.00 08/31/2018   LDLDIRECT 54.0 07/12/2017   LDLCALC 56 08/31/2018   ALT 17 08/31/2018   AST 25 08/31/2018   NA 140 08/31/2018   K 4.1 08/31/2018   CL 103 08/31/2018   CREATININE 1.48 (H) 08/31/2018   BUN 50 (H) 08/31/2018   CO2 27 08/31/2018   TSH 2.29 05/17/2011   HGBA1C 5.8 08/31/2018   MICROALBUR 2.0 (H) 12/19/2017    Dg Inject Diag/thera/inc Needle/cath/plc Epi/lumb/sac W/img  Result Date: 12/18/2018 CLINICAL DATA:  Spondylosis without myelopathy. Low back pain and right leg pain. Previous discectomy and fusion procedures. FLUOROSCOPY TIME:  0 minutes 23 seconds. 17.24 micro gray meter squared PROCEDURE: The procedure, risks, benefits, and alternatives were explained to the patient. Questions regarding the procedure were encouraged and answered. The patient understands and consents to the procedure. LUMBAR EPIDURAL INJECTION: An interlaminar approach was performed on the right at L5-S1. The overlying skin was cleansed and anesthetized. A 20 gauge epidural needle was advanced using loss-of-resistance technique. DIAGNOSTIC  EPIDURAL INJECTION: Injection of Isovue-M 200 shows a good epidural pattern with spread above and below the level of needle placement, primarily on the right. No vascular opacification is seen. THERAPEUTIC EPIDURAL INJECTION: One hundred twenty mg of Depo-Medrol mixed with 2 cc 1% lidocaine were instilled. The procedure was well-tolerated, and the patient was discharged thirty minutes following the injection in good condition. COMPLICATIONS: None IMPRESSION: Technically successful epidural injection on the right at L5-S1 # 1. Electronically Signed   By: Nelson Chimes M.D.   On: 12/18/2018 13:31     Assessment & Plan:  Plan  I am having Erin [name] maintain her ergocalciferol, albuterol, Fluticasone-Salmeterol, torsemide, ferrous sulfate, albuterol, Acetaminophen (TYLENOL 8 HOUR PO), insulin aspart, glucose blood, fenofibrate micronized, diclofenac sodium, pramipexole, pantoprazole, cephALEXin, diclofenac sodium, BD Pen Needle Nano U/F, atorvastatin, Basaglar KwikPen, EPINEPHrine, sertraline, busPIRone, allopurinol, carvedilol, and clopidogrel.  No orders of the defined types were placed in this encounter.   Problem List Items Addressed This Visit      Unprioritized   Chronic kidney disease    Per nephro      Depression, major, single episode, mild ([place])    Stable con't zoloft      Diabetes mellitus type II, controlled ([place])    Per endo Will check hgba1c -- since last ov with endo was virtual      Relevant Orders   Hemoglobin A1c   Comprehensive metabolic panel   Essential hypertension    Well controlled, no changes to meds. Encouraged heart healthy diet such as the DASH diet and exercise as tolerated.       Hyperlipidemia    Tolerating statin, encouraged heart healthy diet, avoid trans fats, minimize simple carbs and saturated fats. Increase exercise as tolerated       Other Visit Diagnoses    Hyperlipidemia associated with type 2 diabetes mellitus ([place])    -  Primary    Relevant Orders   Lipid panel   CBC with Differential/Platelet   Need for influenza vaccination       Relevant Orders   Flu Vaccine QUAD High Dose(Fluad)  Vitamin D deficiency       Relevant Orders   Vitamin D (25 hydroxy)      Follow-up: Return in about 6 months (around 07/24/2019), or if symptoms worsen or fail to improve, for hypertension, hyperlipidemia, diabetes II.  Ann Held, DO

## 2019-01-23 NOTE — Assessment & Plan Note (Signed)
Well controlled, no changes to meds. Encouraged heart healthy diet such as the DASH diet and exercise as tolerated.  °

## 2019-01-23 NOTE — Patient Instructions (Signed)

## 2019-01-31 ENCOUNTER — Other Ambulatory Visit: Payer: Self-pay | Admitting: Family Medicine

## 2019-01-31 DIAGNOSIS — R7989 Other specified abnormal findings of blood chemistry: Secondary | ICD-10-CM

## 2019-01-31 DIAGNOSIS — D509 Iron deficiency anemia, unspecified: Secondary | ICD-10-CM

## 2019-02-01 ENCOUNTER — Other Ambulatory Visit: Payer: Self-pay

## 2019-02-01 NOTE — Addendum Note (Signed)
Addended by: Wynonia Musty A on: 02/01/2019 03:54 PM   Modules accepted: Orders

## 2019-02-05 ENCOUNTER — Other Ambulatory Visit: Payer: Self-pay | Admitting: Family Medicine

## 2019-02-12 ENCOUNTER — Ambulatory Visit (INDEPENDENT_AMBULATORY_CARE_PROVIDER_SITE_OTHER): Payer: Medicare Other | Admitting: *Deleted

## 2019-02-12 DIAGNOSIS — I509 Heart failure, unspecified: Secondary | ICD-10-CM | POA: Diagnosis not present

## 2019-02-12 NOTE — Progress Notes (Signed)
[name] [place], [place] [address] Phone: [phone] Subjective:    [name], am serving as a scribe for Dr. Hulan Saas.   CC: Knee pain follow-up  HER:[id number]   01/04/2019 Viscosupplementation given today.  Tolerated the procedure well.  Am hoping the patient will have some relief for 6 to 12 months.  Discussed with her with due to the severity of the arthritis likely will be not long-term.  Increase activity slowly otherwise.  Encourage weight loss.  Follow-up again in 6 to 8 weeks  Update 02/13/2019 Erin [name] is a 73 y.o. female coming in with complaint of bilateral knee pain. Patient states that she did not have much relief from the visco injections. Right is worse than left.  Patient continues to have discomfort.  Patient states that it seems to be coming from her back at this moment.  Patient states that that seems to stop her from activity more than the knee some sounds.     Past Medical History:  Diagnosis Date  . Asthma   . CHF (congestive heart failure) [facility])    hospital 11/2013-- [place]  . Chronic kidney disease   . Depression   . Diabetes mellitus ([place])   . GERD (gastroesophageal reflux disease)   . Hyperlipidemia   . Hypertension   . Stroke [facility])    Past Surgical History:  Procedure Laterality Date  . CHOLECYSTECTOMY  2002  . LOOP RECORDER INSERTION N/A 08/01/2017   Procedure: LOOP RECORDER INSERTION;  Surgeon: Constance Haw, MD;  Location: [facility];  Service: Cardiovascular;  Laterality: N/A;  . SPINE SURGERY  aug 2015   [facility]  . TEE WITHOUT CARDIOVERSION N/A 08/01/2017   Procedure: TRANSESOPHAGEAL ECHOCARDIOGRAM (TEE);  Surgeon: Sanda Klein, MD;  Location: [facility] ENDOSCOPY;  Service: Cardiovascular;  Laterality: N/A;  . TONSILLECTOMY    . TUBAL LIGATION     Social History   Socioeconomic History  . Marital status: Widowed    Spouse name: Not on file  .  Number of children: 4  . Years of education: Not on file  . Highest education level: Not on file  Occupational History  . 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 Financial resource strain: Not on file  . Food insecurity    Worry: Not on file    Inability: Not on file  . Transportation needs    Medical: Not on file    Non-medical: Not on file  Tobacco Use  . Smoking status: Former Smoker    Packs/day: 1.50    Years: 20.00    Pack years: 30.00    Types: Cigarettes    Quit date: 08/02/1984    Years since quitting: 34.5  . Smokeless tobacco: Never Used  Substance and Sexual Activity  . Alcohol use: No  . Drug use: No  . Sexual activity: Not Currently    Partners: Male  Lifestyle  . Physical activity    Days per week: Not on file    Minutes per session: Not on file  . Stress: Not on file  Relationships  . Social Herbalist on phone: Not on file    Gets together: Not on file    Attends religious service: Not on file    Active member of club or organization: Not on file    Attends meetings of clubs or organizations: Not on  file    Relationship status: Not on file  Other Topics Concern  . Not on file  Social History Narrative   NO REG EXERCISE   Caffeine use: daily   Allergies  Allergen Reactions  . Bee Venom    Family History  Problem Relation Age of Onset  . Hypertension Mother   . Alzheimer's disease Mother   . Hyperlipidemia Mother   . Heart disease Father        cad  . Asthma Father   . Cancer Father 18       leukemia  . Stroke Father   . Hypertension Father   . Heart disease Sister 81       MI  . Pulmonary fibrosis Sister   . Arthritis Brother   . Heart disease Maternal Uncle   . Stroke Maternal Uncle   . Uterine cancer Paternal Grandmother   . Stomach cancer Paternal Grandfather   . Heart disease Maternal Uncle   . Heart disease Maternal Uncle     Current Outpatient Medications (Endocrine &  Metabolic):  .  insulin aspart (NOVOLOG FLEXPEN) 100 UNIT/ML FlexPen, Inject 5 units 3x a day before meals (Patient not taking: Reported on 01/23/2019) .  Insulin Glargine (BASAGLAR KWIKPEN) 100 UNIT/ML SOPN, INJECT 0.4 MLS (40 UNITS TOTAL) INTO THE SKIN AT BEDTIME.  Current Outpatient Medications (Cardiovascular):  .  atorvastatin (LIPITOR) 40 MG tablet, TAKE 1 TABLET BY MOUTH EVERYDAY AT BEDTIME .  carvedilol (COREG) 6.25 MG tablet, TAKE 1 TABLET (6.25 MG TOTAL) BY MOUTH 2 (TWO) TIMES DAILY WITH A MEAL. Marland Kitchen  EPINEPHrine 0.3 mg/0.3 mL IJ SOAJ injection, PLEASE SEE ATTACHED FOR DETAILED DIRECTIONS .  fenofibrate micronized (LOFIBRA) 200 MG capsule, Take 1 capsule (200 mg total) by mouth daily. Marland Kitchen  torsemide (DEMADEX) 20 MG tablet, TAKE FOUR TABLETS BY MOUTH TWICE DAILY  Current Outpatient Medications (Respiratory):  .  albuterol (PROVENTIL) (2.5 MG/3ML) 0.083% nebulizer solution, Take 3 mLs (2.5 mg total) by nebulization every 6 (six) hours as needed for wheezing or shortness of breath. Marland Kitchen  albuterol (VENTOLIN HFA) 108 (90 Base) MCG/ACT inhaler, Inhale 2 puffs into the lungs every 4 (four) hours as needed for wheezing or shortness of breath.  .  Fluticasone-Salmeterol (ADVAIR) 250-50 MCG/DOSE AEPB, Inhale 1 puff into the lungs 2 (two) times daily.  Current Outpatient Medications (Analgesics):  Marland Kitchen  Acetaminophen (TYLENOL 8 HOUR PO), Take 500 mg by mouth. Marland Kitchen  allopurinol (ZYLOPRIM) 100 MG tablet, TAKE 1 TABLET BY MOUTH EVERY DAY  Current Outpatient Medications (Hematological):  .  clopidogrel (PLAVIX) 75 MG tablet, TAKE 1 TABLET BY MOUTH EVERY DAY .  ferrous sulfate 325 (65 FE) MG tablet, Take 325 mg by mouth.  Current Outpatient Medications (Other):  .  busPIRone (BUSPAR) 15 MG tablet, TAKE 1 TABLET BY MOUTH 3 TIMES DAILY. .  cephALEXin (KEFLEX) 500 MG capsule, Take 1 capsule (500 mg total) by mouth 2 (two) times daily. (Patient not taking: Reported on 01/23/2019) .  diclofenac sodium (VOLTAREN)  1 % GEL, Apply 4 g topically 4 (four) times daily. .  diclofenac sodium (VOLTAREN) 1 % GEL, Apply 4 g topically 4 (four) times daily. (Patient not taking: Reported on 01/23/2019) .  ergocalciferol (VITAMIN D2) 50000 UNITS capsule, Take 50,000 Units by mouth once a week. Wednesday .  gabapentin (NEURONTIN) 100 MG capsule, Take 1 capsule (100 mg total) by mouth at bedtime. Marland Kitchen  glucose blood test strip, Use 3x a day - One TOUch Ultra .  Insulin  Pen Needle (BD PEN NEEDLE NANO U/F) 32G X 4 MM MISC, USE 4 TIMES DAILY .  pantoprazole (PROTONIX) 40 MG tablet, TAKE 1 TABLET BY MOUTH EVERY DAY .  pramipexole (MIRAPEX) 1 MG tablet, TAKE 1 TABLET BY MOUTH AT BEDTIME. Marland Kitchen  sertraline (ZOLOFT) 50 MG tablet, TAKE 1 AND 1/2 TABLETS DAILY BY MOUTH    Past medical history, social, surgical and family history all reviewed in electronic medical record.  No pertanent information unless stated regarding to the chief complaint.   Review of Systems:  No headache, visual changes, nausea, vomiting, diarrhea, constipation, dizziness, abdominal pain, skin rash, fevers, chills, night sweats, weight loss, swollen lymph nodes,  chest pain, shortness of breath, mood changes.  Positive muscle aches, joint swelling, body aches  Objective  Blood pressure (!) 146/60, pulse 76, height 5\' 5"  (1.651 m), SpO2 98 %.    General: No apparent distress alert but not oriented mood and affect normal, dressed appropriately.  Overweight HEENT: Pupils equal, extraocular movements intact  Respiratory: Patient's speak in full sentences and does not appear short of breath  Cardiovascular: 1+ lower extremity edema, non tender, no erythema  Skin: Warm dry intact with no signs of infection or rash on extremities or on axial skeleton.  Abdomen: Soft nontender  Neuro: Cranial nerves II through XII are intact, neurovascularly intact in all extremities with 2+ DTRs and 2+ pulses.  Lymph: No lymphadenopathy of posterior or anterior cervical chain or  axillae bilaterally.  Gait severely antalgic MSK:  tender with limited range of motion and good stability and symmetric strength and tone of shoulders, elbows, wrist, hip and ankles bilaterally.  Knee: Bilateral valgus deformity noted. Abnormal thigh to calf ratio.  Tender to palpation over medial and PF joint line.  ROM full in flexion and extension and lower leg rotation. instability with valgus force.  painful patellar compression. Patellar glide with moderate crepitus. Patellar and quadriceps tendons unremarkable. Hamstring and quadriceps strength is normal.  Back exam TTP diffusely.  Tight SLT bilaterally.  Unable to do FABER due to size        Impression and Recommendations:     This case required medical decision making of moderate complexity. The above documentation has been reviewed and is accurate and complete Lyndal Pulley, DO       Note: This dictation was prepared with Dragon dictation along with smaller phrase technology. Any transcriptional errors that result from this process are unintentional.

## 2019-02-13 ENCOUNTER — Other Ambulatory Visit: Payer: Self-pay

## 2019-02-13 ENCOUNTER — Ambulatory Visit (INDEPENDENT_AMBULATORY_CARE_PROVIDER_SITE_OTHER): Payer: Medicare Other | Admitting: Family Medicine

## 2019-02-13 ENCOUNTER — Encounter: Payer: Self-pay | Admitting: Family Medicine

## 2019-02-13 ENCOUNTER — Ambulatory Visit (INDEPENDENT_AMBULATORY_CARE_PROVIDER_SITE_OTHER)
Admission: RE | Admit: 2019-02-13 | Discharge: 2019-02-13 | Disposition: A | Discharge status: Home / Self Care | Payer: Medicare Other | Source: Ambulatory Visit | Attending: Family Medicine | Admitting: Family Medicine

## 2019-02-13 VITALS — BP 146/60 | HR 76 | Ht 65.0 in

## 2019-02-13 DIAGNOSIS — M545 Low back pain, unspecified: Secondary | ICD-10-CM

## 2019-02-13 DIAGNOSIS — M170 Bilateral primary osteoarthritis of knee: Secondary | ICD-10-CM

## 2019-02-13 DIAGNOSIS — I639 Cerebral infarction, unspecified: Secondary | ICD-10-CM

## 2019-02-13 DIAGNOSIS — M549 Dorsalgia, unspecified: Secondary | ICD-10-CM

## 2019-02-13 DIAGNOSIS — M47816 Spondylosis without myelopathy or radiculopathy, lumbar region: Secondary | ICD-10-CM | POA: Diagnosis not present

## 2019-02-13 LAB — CUP PACEART REMOTE DEVICE CHECK
Date Time Interrogation Session: 20201107194156
Implantable Pulse Generator Implant Date: 20190429

## 2019-02-13 MED ORDER — KETOROLAC TROMETHAMINE 30 MG/ML IJ SOLN
30.0000 mg | Freq: Once | INTRAMUSCULAR | Status: AC
  Administered 2019-02-13: 30 mg via INTRAMUSCULAR

## 2019-02-13 MED ORDER — METHYLPREDNISOLONE ACETATE 40 MG/ML IJ SUSP
40.0000 mg | Freq: Once | INTRAMUSCULAR | Status: AC
  Administered 2019-02-13: 10:00:00 40 mg via INTRAMUSCULAR

## 2019-02-13 MED ORDER — GABAPENTIN 100 MG PO CAPS: 100.0000 mg | ORAL_CAPSULE | Freq: Every day | ORAL | 0 refills | Status: DC

## 2019-02-13 NOTE — Assessment & Plan Note (Signed)
Discussed posture and ergonomics around patient has had x-rays, we can do advanced imaging and the potential for epidurals but patient is on blood thinner.  Follow-up again 4 to 8 weeks

## 2019-02-13 NOTE — Patient Instructions (Signed)
Xray downstairs See me again for a virtual visit in 2 weeks

## 2019-02-13 NOTE — Assessment & Plan Note (Signed)
Patient continues to have some discomfort.  Patient did not improve with the steroids or the viscosupplementation.  Concern for some lumbar radiculopathy, patient is really not a surgical candidate at the moment. Talked with patient and daughter.  I do believe the patient does have some underlying dementia that is contributing.  We discussed further work-up of the back.  Follow-up again in 4 to 6 weeks

## 2019-02-26 NOTE — Progress Notes (Signed)
Virtual Visit via Video Note  I connected with Erin [name] on 02/26/19 at  4:15 PM EST by a video enabled telemedicine application and verified that I am speaking with the correct person using two identifiers.  Location: Patient: In home setting with daughter Provider: In office setting   I discussed the limitations of evaluation and management by telemedicine and the availability of in person appointments. The patient expressed understanding and agreed to proceed.  History of Present Illness: Patient is a very pleasant 73 year old female who has a history of dementia and complaining of low back pain.  Patient feels like the intervention so far has not been helpful.  Patient and daughter states that she wakes up at night sometimes in any.  Pain medicines do help and try not to take them all the time because of the confusion.    Observations/Objective: Alert but not oriented.  Sitting comfortably on the couch.  Discussed warmth and care with daughter.   Assessment and Plan: 73 year old female with a past medical history significant for spinal stenosis status post surgical intervention.  Patient would not likely be a surgical candidate but would be a candidate for epidural even though patient is on Plavix.  Feel an MRI is necessary to further evaluate.  Depending on findings we can discuss the possibility of the injections.  Follow-up again after imaging and discuss   Follow Up Instructions: Follow-up after imaging    I discussed the assessment and treatment plan with the patient. The patient was provided an opportunity to ask questions and all were answered. The patient agreed with the plan and demonstrated an understanding of the instructions.   The patient was advised to call back or seek an in-person evaluation if the symptoms worsen or if the condition fails to improve as anticipated.  I provided 86minutes of face-to-face time during this encounter.   Lyndal Pulley, DO

## 2019-02-27 ENCOUNTER — Ambulatory Visit (INDEPENDENT_AMBULATORY_CARE_PROVIDER_SITE_OTHER): Payer: Medicare Other | Admitting: Family Medicine

## 2019-02-27 ENCOUNTER — Encounter: Payer: Self-pay | Admitting: Family Medicine

## 2019-02-27 DIAGNOSIS — M48062 Spinal stenosis, lumbar region with neurogenic claudication: Secondary | ICD-10-CM | POA: Diagnosis not present

## 2019-02-27 DIAGNOSIS — I639 Cerebral infarction, unspecified: Secondary | ICD-10-CM

## 2019-02-27 DIAGNOSIS — M5416 Radiculopathy, lumbar region: Secondary | ICD-10-CM | POA: Diagnosis not present

## 2019-03-12 ENCOUNTER — Telehealth: Payer: Self-pay | Admitting: *Deleted

## 2019-03-12 NOTE — Telephone Encounter (Signed)
Pt's daughter left msg stating that the pt is still having pain. She is waiting for MRI to be scheduled & would like a rx for pain called into the pharmacy.

## 2019-03-13 ENCOUNTER — Other Ambulatory Visit: Payer: Self-pay

## 2019-03-13 ENCOUNTER — Encounter: Payer: Self-pay | Admitting: Family Medicine

## 2019-03-13 DIAGNOSIS — G8929 Other chronic pain: Secondary | ICD-10-CM

## 2019-03-13 DIAGNOSIS — M545 Low back pain, unspecified: Secondary | ICD-10-CM

## 2019-03-13 MED ORDER — PREDNISONE 50 MG PO TABS: ORAL_TABLET | ORAL | 0 refills | Status: DC

## 2019-03-13 MED ORDER — PREDNISONE 50 MG PO TABS: 50.0000 mg | ORAL_TABLET | Freq: Every day | ORAL | 0 refills | Status: DC

## 2019-03-13 NOTE — Telephone Encounter (Signed)
Will do another dose of prednisone for 5 days. Do not feel comfortable with pain meds in this individual.

## 2019-03-13 NOTE — Telephone Encounter (Signed)
Talked to patient's daughter. Resent MRI and sent in prednisone.

## 2019-03-14 NOTE — Progress Notes (Signed)
Carelink Summary Report / Loop Recorder 

## 2019-03-15 ENCOUNTER — Ambulatory Visit (INDEPENDENT_AMBULATORY_CARE_PROVIDER_SITE_OTHER): Payer: Medicare Other | Admitting: *Deleted

## 2019-03-15 DIAGNOSIS — I639 Cerebral infarction, unspecified: Secondary | ICD-10-CM

## 2019-03-15 LAB — CUP PACEART REMOTE DEVICE CHECK
Date Time Interrogation Session: 20201210144505
Implantable Pulse Generator Implant Date: 20190429

## 2019-03-20 ENCOUNTER — Other Ambulatory Visit: Payer: Self-pay | Admitting: Family Medicine

## 2019-03-20 DIAGNOSIS — K219 Gastro-esophageal reflux disease without esophagitis: Secondary | ICD-10-CM

## 2019-03-20 NOTE — Telephone Encounter (Signed)
Dyspepsia

## 2019-03-20 NOTE — Telephone Encounter (Signed)
Dr Carollee Herter -- pharmacy is asking for DX code for omeprazole but I did not see one on her problem list. Please advise?

## 2019-03-23 ENCOUNTER — Encounter: Payer: Self-pay | Admitting: Family Medicine

## 2019-04-09 ENCOUNTER — Ambulatory Visit
Admission: RE | Admit: 2019-04-09 | Discharge: 2019-04-09 | Disposition: A | Discharge status: Home / Self Care | Payer: Medicare Other | Source: Ambulatory Visit | Attending: Family Medicine | Admitting: Family Medicine

## 2019-04-09 DIAGNOSIS — M5416 Radiculopathy, lumbar region: Secondary | ICD-10-CM

## 2019-04-17 ENCOUNTER — Encounter: Payer: Self-pay | Admitting: Family Medicine

## 2019-04-17 ENCOUNTER — Ambulatory Visit (INDEPENDENT_AMBULATORY_CARE_PROVIDER_SITE_OTHER): Payer: Medicare Other | Admitting: *Deleted

## 2019-04-17 ENCOUNTER — Other Ambulatory Visit: Payer: Self-pay

## 2019-04-17 DIAGNOSIS — M5416 Radiculopathy, lumbar region: Secondary | ICD-10-CM

## 2019-04-17 DIAGNOSIS — I639 Cerebral infarction, unspecified: Secondary | ICD-10-CM

## 2019-04-17 LAB — CUP PACEART REMOTE DEVICE CHECK
Date Time Interrogation Session: 20210112151255
Implantable Pulse Generator Implant Date: 20190429

## 2019-04-17 NOTE — Progress Notes (Signed)
ILR remote 

## 2019-04-25 ENCOUNTER — Inpatient Hospital Stay: Admission: RE | Admit: 2019-04-25 | Payer: Medicare Other | Source: Ambulatory Visit

## 2019-05-02 ENCOUNTER — Ambulatory Visit
Admission: RE | Admit: 2019-05-02 | Discharge: 2019-05-02 | Disposition: A | Discharge status: Home / Self Care | Payer: Medicare Other | Source: Ambulatory Visit | Attending: Family Medicine | Admitting: Family Medicine

## 2019-05-02 ENCOUNTER — Other Ambulatory Visit: Payer: Self-pay

## 2019-05-02 DIAGNOSIS — M5416 Radiculopathy, lumbar region: Secondary | ICD-10-CM

## 2019-05-02 MED ORDER — IOPAMIDOL (ISOVUE-M 200) INJECTION 41%
1.0000 mL | Freq: Once | INTRAMUSCULAR | Status: AC
  Administered 2019-05-02: 1 mL via EPIDURAL

## 2019-05-02 MED ORDER — METHYLPREDNISOLONE ACETATE 40 MG/ML INJ SUSP (RADIOLOG
120.0000 mg | Freq: Once | INTRAMUSCULAR | Status: AC
  Administered 2019-05-02: 120 mg via EPIDURAL

## 2019-05-02 NOTE — Discharge Instructions (Signed)

## 2019-05-08 ENCOUNTER — Other Ambulatory Visit: Payer: Self-pay | Admitting: Family Medicine

## 2019-05-21 ENCOUNTER — Ambulatory Visit (INDEPENDENT_AMBULATORY_CARE_PROVIDER_SITE_OTHER): Payer: Medicare Other | Admitting: *Deleted

## 2019-05-21 DIAGNOSIS — I639 Cerebral infarction, unspecified: Secondary | ICD-10-CM | POA: Diagnosis not present

## 2019-05-21 LAB — CUP PACEART REMOTE DEVICE CHECK
Date Time Interrogation Session: 20210214232539
Implantable Pulse Generator Implant Date: 20190429

## 2019-05-22 NOTE — Progress Notes (Signed)
ILR Remote 

## 2019-05-24 ENCOUNTER — Ambulatory Visit: Payer: Medicare Other | Admitting: Family Medicine

## 2019-06-14 ENCOUNTER — Other Ambulatory Visit: Payer: Self-pay

## 2019-06-14 ENCOUNTER — Encounter: Payer: Self-pay | Admitting: Family Medicine

## 2019-06-14 ENCOUNTER — Ambulatory Visit (INDEPENDENT_AMBULATORY_CARE_PROVIDER_SITE_OTHER): Payer: Medicare Other | Admitting: Family Medicine

## 2019-06-14 VITALS — BP 160/60 | HR 81 | Ht 65.0 in | Wt 203.0 lb

## 2019-06-14 DIAGNOSIS — E559 Vitamin D deficiency, unspecified: Secondary | ICD-10-CM | POA: Diagnosis not present

## 2019-06-14 DIAGNOSIS — F320 Major depressive disorder, single episode, mild: Secondary | ICD-10-CM | POA: Diagnosis not present

## 2019-06-14 DIAGNOSIS — M2550 Pain in unspecified joint: Secondary | ICD-10-CM | POA: Diagnosis not present

## 2019-06-14 DIAGNOSIS — I639 Cerebral infarction, unspecified: Secondary | ICD-10-CM

## 2019-06-14 DIAGNOSIS — J42 Unspecified chronic bronchitis: Secondary | ICD-10-CM | POA: Diagnosis not present

## 2019-06-14 DIAGNOSIS — D696 Thrombocytopenia, unspecified: Secondary | ICD-10-CM | POA: Diagnosis not present

## 2019-06-14 DIAGNOSIS — E1169 Type 2 diabetes mellitus with other specified complication: Secondary | ICD-10-CM | POA: Diagnosis not present

## 2019-06-14 DIAGNOSIS — E785 Hyperlipidemia, unspecified: Secondary | ICD-10-CM

## 2019-06-14 DIAGNOSIS — E1165 Type 2 diabetes mellitus with hyperglycemia: Secondary | ICD-10-CM | POA: Diagnosis not present

## 2019-06-14 DIAGNOSIS — IMO0002 Reserved for concepts with insufficient information to code with codable children: Secondary | ICD-10-CM

## 2019-06-14 DIAGNOSIS — E1151 Type 2 diabetes mellitus with diabetic peripheral angiopathy without gangrene: Secondary | ICD-10-CM

## 2019-06-14 DIAGNOSIS — M549 Dorsalgia, unspecified: Secondary | ICD-10-CM

## 2019-06-14 DIAGNOSIS — I509 Heart failure, unspecified: Secondary | ICD-10-CM | POA: Diagnosis not present

## 2019-06-14 LAB — CBC WITH DIFFERENTIAL/PLATELET
Basophils Absolute: 0.1 10*3/uL (ref 0.0–0.1)
Basophils Relative: 1.1 % (ref 0.0–3.0)
Eosinophils Absolute: 0 10*3/uL (ref 0.0–0.7)
Eosinophils Relative: 0.5 % (ref 0.0–5.0)
HCT: 28.8 % — ABNORMAL LOW (ref 36.0–46.0)
Hemoglobin: 9.5 g/dL — ABNORMAL LOW (ref 12.0–15.0)
Lymphocytes Relative: 10.6 % — ABNORMAL LOW (ref 12.0–46.0)
Lymphs Abs: 0.8 10*3/uL (ref 0.7–4.0)
MCHC: 33 g/dL (ref 30.0–36.0)
MCV: 89.5 fl (ref 78.0–100.0)
Monocytes Absolute: 0.5 10*3/uL (ref 0.1–1.0)
Monocytes Relative: 7.2 % (ref 3.0–12.0)
Neutro Abs: 6.1 10*3/uL (ref 1.4–7.7)
Neutrophils Relative %: 80.6 % — ABNORMAL HIGH (ref 43.0–77.0)
Platelets: 110 10*3/uL — ABNORMAL LOW (ref 150.0–400.0)
RBC: 3.22 Mil/uL — ABNORMAL LOW (ref 3.87–5.11)
RDW: 17.5 % — ABNORMAL HIGH (ref 11.5–15.5)
WBC: 7.6 10*3/uL (ref 4.0–10.5)

## 2019-06-14 LAB — SEDIMENTATION RATE: Sed Rate: 45 mm/hr — ABNORMAL HIGH (ref 0–30)

## 2019-06-14 MED ORDER — AMBULATORY NON FORMULARY MEDICATION: 0 refills | Status: DC

## 2019-06-14 MED ORDER — KETOROLAC TROMETHAMINE 30 MG/ML IJ SOLN
30.0000 mg | Freq: Once | INTRAMUSCULAR | Status: AC
  Administered 2019-06-14: 30 mg via INTRAMUSCULAR

## 2019-06-14 MED ORDER — METHYLPREDNISOLONE ACETATE 40 MG/ML IJ SUSP
40.0000 mg | Freq: Once | INTRAMUSCULAR | Status: AC
  Administered 2019-06-14: 40 mg via INTRAMUSCULAR

## 2019-06-14 NOTE — Progress Notes (Signed)
[facility] [address] [place] Phone: [phone] Subjective:   I Erin [name] am serving as a Education administrator for Dr. Hulan Saas.  This visit occurred during the SARS-CoV-2 public health emergency.  Safety protocols were in place, including screening questions prior to the visit, additional usage of staff PPE, and extensive cleaning of exam room while observing appropriate contact time as indicated for disinfecting solutions.   I'm seeing this patient by the request  of:  Ann Held, DO  CC: Low back pain follow-up  [id number]   02/27/2019 74 year old female with a past medical history significant for spinal stenosis status post surgical intervention.  Patient would not likely be a surgical candidate but would be a candidate for epidural even though patient is on Plavix.  Feel an MRI is necessary to further evaluate.  Depending on findings we can discuss the possibility of the injections.  Follow-up again after imaging and discuss  Update 06/14/2019 [name] is a 74 y.o. female coming in with complaint of back pain. Epidural on 05/02/2019. States she is still having lower back and bilateral leg pain. Epidural helped some. Patient as well as granddaughter feels like she has made some improvement but unfortunately continues to have pain on a chronic daily basis.  States that continues to have pain even at night.  Affecting daily activities.  Patient states that she is never without some type of discomfort in the back.   MRI in January 2021 showed patient did have moderate to severe spinal stenosis at L5-S1 that would contribute.  This was independently visualized by me.  Epidural was done on May 02, 2019  Past Medical History:  Diagnosis Date  . Asthma   . CHF (congestive heart failure) [facility])    hospital 11/2013-- [place]  . Chronic kidney disease   . Depression   . Diabetes mellitus ([place])   . GERD  (gastroesophageal reflux disease)   . Hyperlipidemia   . Hypertension   . Stroke [facility])    Past Surgical History:  Procedure Laterality Date  . CHOLECYSTECTOMY  2002  . LOOP RECORDER INSERTION N/A 08/01/2017   Procedure: LOOP RECORDER INSERTION;  Surgeon: Constance Haw, MD;  Location: [facility];  Service: Cardiovascular;  Laterality: N/A;  . SPINE SURGERY  aug 2015   [facility]  . TEE WITHOUT CARDIOVERSION N/A 08/01/2017   Procedure: TRANSESOPHAGEAL ECHOCARDIOGRAM (TEE);  Surgeon: Sanda Klein, MD;  Location: [facility] ENDOSCOPY;  Service: Cardiovascular;  Laterality: N/A;  . TONSILLECTOMY    . TUBAL LIGATION     Social History   Socioeconomic History  . Marital status: Widowed    Spouse name: Not on file  . Number of children: 4  . Years of education: Not on file  . Highest education level: Not on file  Occupational History  . Occupation: [employer] @ [place]: [employer]  Tobacco Use  . Smoking status: Former Smoker    Packs/day: 1.50    Years: 20.00    Pack years: 30.00    Types: Cigarettes    Quit date: 08/02/1984    Years since quitting: 34.8  . Smokeless tobacco: Never Used  Substance and Sexual Activity  . Alcohol use: No  . Drug use: No  . Sexual activity: Not Currently    Partners: Male  Other Topics Concern  . Not on file  Social History Narrative   NO REG EXERCISE  Caffeine use: daily   Social Determinants of Health   Financial Resource Strain:   . Difficulty of Paying Living Expenses:   Food Insecurity:   . Worried About Charity fundraiser in the Last Year:   . Arboriculturist in the Last Year:   Transportation Needs:   . Film/video editor (Medical):   Marland Kitchen Lack of Transportation (Non-Medical):   Physical Activity:   . Days of Exercise per Week:   . Minutes of Exercise per Session:   Stress:   . Feeling of Stress :   Social Connections:   . Frequency of Communication with Friends and Family:   . Frequency  of Social Gatherings with Friends and Family:   . Attends Religious Services:   . Active Member of Clubs or Organizations:   . Attends Archivist Meetings:   Marland Kitchen Marital Status:    Allergies  Allergen Reactions  . Bee Venom    Family History  Problem Relation Age of Onset  . Hypertension Mother   . Alzheimer's disease Mother   . Hyperlipidemia Mother   . Heart disease Father        cad  . Asthma Father   . Cancer Father 65       leukemia  . Stroke Father   . Hypertension Father   . Heart disease Sister 5       MI  . Pulmonary fibrosis Sister   . Arthritis Brother   . Heart disease Maternal Uncle   . Stroke Maternal Uncle   . Uterine cancer Paternal Grandmother   . Stomach cancer Paternal Grandfather   . Heart disease Maternal Uncle   . Heart disease Maternal Uncle     Current Outpatient Medications (Endocrine & Metabolic):  .  insulin aspart (NOVOLOG FLEXPEN) 100 UNIT/ML FlexPen, Inject 5 units 3x a day before meals .  Insulin Glargine (BASAGLAR KWIKPEN) 100 UNIT/ML SOPN, INJECT 0.4 MLS (40 UNITS TOTAL) INTO THE SKIN AT BEDTIME. .  predniSONE (DELTASONE) 50 MG tablet, Take 1 tablet (50 mg total) by mouth daily. .  predniSONE (DELTASONE) 50 MG tablet, 1 tablet by mouth daily  Current Outpatient Medications (Cardiovascular):  .  atorvastatin (LIPITOR) 40 MG tablet, TAKE 1 TABLET BY MOUTH EVERYDAY AT BEDTIME .  carvedilol (COREG) 6.25 MG tablet, TAKE 1 TABLET (6.25 MG TOTAL) BY MOUTH 2 (TWO) TIMES DAILY WITH A MEAL. Marland Kitchen  EPINEPHrine 0.3 mg/0.3 mL IJ SOAJ injection, PLEASE SEE ATTACHED FOR DETAILED DIRECTIONS .  fenofibrate micronized (LOFIBRA) 200 MG capsule, Take 1 capsule (200 mg total) by mouth daily. Marland Kitchen  torsemide (DEMADEX) 20 MG tablet, TAKE FOUR TABLETS BY MOUTH TWICE DAILY  Current Outpatient Medications (Respiratory):  .  albuterol (PROVENTIL) (2.5 MG/3ML) 0.083% nebulizer solution, Take 3 mLs (2.5 mg total) by nebulization every 6 (six) hours as needed for  wheezing or shortness of breath. Marland Kitchen  albuterol (VENTOLIN HFA) 108 (90 Base) MCG/ACT inhaler, Inhale 2 puffs into the lungs every 4 (four) hours as needed for wheezing or shortness of breath.  .  Fluticasone-Salmeterol (ADVAIR) 250-50 MCG/DOSE AEPB, Inhale 1 puff into the lungs 2 (two) times daily.  Current Outpatient Medications (Analgesics):  Marland Kitchen  Acetaminophen (TYLENOL 8 HOUR PO), Take 500 mg by mouth. Marland Kitchen  allopurinol (ZYLOPRIM) 100 MG tablet, TAKE 1 TABLET BY MOUTH EVERY DAY  Current Outpatient Medications (Hematological):  .  clopidogrel (PLAVIX) 75 MG tablet, TAKE 1 TABLET BY MOUTH EVERY DAY .  ferrous sulfate 325 (65 FE)  MG tablet, Take 325 mg by mouth.  Current Outpatient Medications (Other):  .  busPIRone (BUSPAR) 15 MG tablet, TAKE 1 TABLET BY MOUTH 3 TIMES DAILY. .  cephALEXin (KEFLEX) 500 MG capsule, Take 1 capsule (500 mg total) by mouth 2 (two) times daily. .  diclofenac sodium (VOLTAREN) 1 % GEL, Apply 4 g topically 4 (four) times daily. .  diclofenac sodium (VOLTAREN) 1 % GEL, Apply 4 g topically 4 (four) times daily. .  ergocalciferol (VITAMIN D2) 50000 UNITS capsule, Take 50,000 Units by mouth once a week. Wednesday .  gabapentin (NEURONTIN) 100 MG capsule, Take 1 capsule (100 mg total) by mouth at bedtime. Marland Kitchen  glucose blood test strip, Use 3x a day - One TOUch Ultra .  Insulin Pen Needle (BD PEN NEEDLE NANO U/F) 32G X 4 MM MISC, USE 4 TIMES DAILY .  pantoprazole (PROTONIX) 40 MG tablet, TAKE 1 TABLET BY MOUTH EVERY DAY .  pramipexole (MIRAPEX) 1 MG tablet, TAKE 1 TABLET BY MOUTH AT BEDTIME. Marland Kitchen  sertraline (ZOLOFT) 50 MG tablet, TAKE 1 AND 1/2 TABLETS DAILY BY MOUTH .  AMBULATORY NON FORMULARY MEDICATION, Rollator   Reviewed prior external information including notes and imaging from  primary care provider As well as notes that were available from care everywhere and other healthcare systems.  Past medical history, social, surgical and family history all reviewed in  electronic medical record.  No pertanent information unless stated regarding to the chief complaint.   Review of Systems:  No headache, visual changes, nausea, vomiting, diarrhea, constipation, dizziness, abdominal pain, skin rash, fevers, chills, night sweats, weight loss, swollen lymph nodes, body aches, joint swelling, chest pain, shortness of breath, mood changes. POSITIVE muscle aches, body aches, joint swelling, lower extremity swelling  Objective  Blood pressure (!) 160/60, pulse 81, height 5\' 5"  (1.651 m), weight 203 lb (92.1 kg), SpO2 98 %.   General: Patient is very uncomfortable sitting in the chair.  Morbidly obese HEENT: Pupils equal, extraocular movements intact  Respiratory: Patient's speak in full sentences and does not appear short of breath  Cardiovascular: 2+ lower extremity edema, non tender, no erythema  Lymph: No lymphadenopathy of posterior or anterior cervical chain or axillae bilaterally.  Gait severely antalgic walking with the aid of a 4 pronged cane MSK: In soft tissue and musculature in multiple different areas.  Low back exam severely tender to even light palpation.  Unable to do much with range of motion secondary to discomfort as well as voluntary guarding.  Appears to be neurovascularly intact distally.     Impression and Recommendations:     This case required medical decision making of moderate complexity. The above documentation has been reviewed and is accurate and complete Erin Pulley, DO       Note: This dictation was prepared with Dragon dictation along with smaller phrase technology. Any transcriptional errors that result from this process are unintentional.

## 2019-06-14 NOTE — Patient Instructions (Addendum)
Good to see you Get labs Lets see what happens before we get another epidural See me again in 6 weeks

## 2019-06-14 NOTE — Assessment & Plan Note (Signed)
To have exacerbation of a chronic problem.  Back pain with radiation.  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  Very minimal results but with patient's epidural.  Patient does have many comorbidities that make treatment very difficult.  Toradol and Depo-Medrol given today to help with some of the pain.  Discussed icing regimen and home exercises, we discussed different medications.  We discussed how other problems including patient's chronic anemia could be contributing as well and we discussed potentially getting laboratory work-up.  We will put in future orders and await patient seen her and her primary care in the near future.  Discussed repeating the epidural more time before we consider the possibility of surgical intervention.  Total time with patient and reviewing imaging and labs 42 minutes.

## 2019-06-16 ENCOUNTER — Other Ambulatory Visit: Payer: Self-pay | Admitting: Family Medicine

## 2019-06-16 DIAGNOSIS — F329 Major depressive disorder, single episode, unspecified: Secondary | ICD-10-CM

## 2019-06-16 DIAGNOSIS — F32A Depression, unspecified: Secondary | ICD-10-CM

## 2019-06-18 ENCOUNTER — Ambulatory Visit (INDEPENDENT_AMBULATORY_CARE_PROVIDER_SITE_OTHER): Payer: Medicare Other | Admitting: *Deleted

## 2019-06-18 DIAGNOSIS — I639 Cerebral infarction, unspecified: Secondary | ICD-10-CM | POA: Diagnosis not present

## 2019-06-19 LAB — VITAMIN D 1,25 DIHYDROXY
Vitamin D 1, 25 (OH)2 Total: 27 pg/mL (ref 18–72)
Vitamin D2 1, 25 (OH)2: <8 pg/mL
Vitamin D3 1, 25 (OH)2: 27 pg/mL

## 2019-06-21 LAB — CUP PACEART REMOTE DEVICE CHECK
Date Time Interrogation Session: 20210318012226
Implantable Pulse Generator Implant Date: 20190429

## 2019-06-25 NOTE — Progress Notes (Signed)
Date:  06/26/2019   ID:  Erin [name], DOB Jul 09, 1945, MRN [id number]  Patient Location: Home Provider Location: Office  PCP:  Carollee Herter, Alferd Apa, DO  Cardiologist:  Stanford Breed  Electrophysiologist:  None   Evaluation Performed:  Follow-Up Visit  Chief Complaint:  Follow Up  History of Present Illness:    Erin [name] is a 74 y.o. female who presents for ongoing assessment and management of HTN, CHF, CKD, HL, with recent MCA CVA, with symptoms of aphasia. CT scan showed slight hemorrhagic transformation into a large left MCA stroke.   Carotid ultrasound was unremarkable TTE revealed no evidence of PFO or source of embolism. A ILR was placed by Dr. Curt Bears on 07/23/2017.  Has not been seen in the office since 2018 by Dr. Stanford Breed, but has been seen by Dr. Curt Bears in 2019 after implantation of ILR.   Only new diagnosis has been worsening RA which has limited her ambulation and uses a walker now due to knee pain. BP has been elevated. She is cared for by her daughter in laws with whom she stays with in divided days between them.  She has not been as active and had gained some weight She has chronic dependent edema.  Her daughter in law is on the call with [place] to assist with the visit.   The patient does not have symptoms concerning for COVID-19 infection (fever, chills, cough, or new shortness of breath).    Past Medical History:  Diagnosis Date  . Asthma   . CHF (congestive heart failure) [facility])    hospital 11/2013-- [place]  . Chronic kidney disease   . Depression   . Diabetes mellitus ([place])   . GERD (gastroesophageal reflux disease)   . Hyperlipidemia   . Hypertension   . Stroke [facility])    Past Surgical History:  Procedure Laterality Date  . CHOLECYSTECTOMY  2002  . LOOP RECORDER INSERTION N/A 08/01/2017   Procedure: LOOP RECORDER INSERTION;  Surgeon: Constance Haw, MD;  Location: [facility];  Service: Cardiovascular;  Laterality: N/A;  . SPINE  SURGERY  aug 2015   [facility]  . TEE WITHOUT CARDIOVERSION N/A 08/01/2017   Procedure: TRANSESOPHAGEAL ECHOCARDIOGRAM (TEE);  Surgeon: Sanda Klein, MD;  Location: [facility] ENDOSCOPY;  Service: Cardiovascular;  Laterality: N/A;  . TONSILLECTOMY    . TUBAL LIGATION       Current Meds  Medication Sig  . Acetaminophen (TYLENOL 8 HOUR PO) Take 500 mg by mouth.  Marland Kitchen albuterol (PROVENTIL) (2.5 MG/3ML) 0.083% nebulizer solution Take 3 mLs (2.5 mg total) by nebulization every 6 (six) hours as needed for wheezing or shortness of breath.  Marland Kitchen albuterol (VENTOLIN HFA) 108 (90 Base) MCG/ACT inhaler Inhale 2 puffs into the lungs every 4 (four) hours as needed for wheezing or shortness of breath.   . allopurinol (ZYLOPRIM) 100 MG tablet TAKE 1 TABLET BY MOUTH EVERY DAY  . AMBULATORY NON FORMULARY MEDICATION Rollator  . atorvastatin (LIPITOR) 40 MG tablet TAKE 1 TABLET BY MOUTH EVERYDAY AT BEDTIME  . busPIRone (BUSPAR) 15 MG tablet TAKE 1 TABLET BY MOUTH 3 TIMES DAILY.  . carvedilol (COREG) 6.25 MG tablet Take 1.5 tablets (9.375 mg total) by mouth 2 (two) times daily with a meal.  . clopidogrel (PLAVIX) 75 MG tablet TAKE 1 TABLET BY MOUTH EVERY DAY  . diclofenac sodium (VOLTAREN) 1 % GEL Apply 4 g topically 4 (four) times daily.  Marland Kitchen EPINEPHrine 0.3 mg/0.3 mL IJ SOAJ injection  PLEASE SEE ATTACHED FOR DETAILED DIRECTIONS  . ergocalciferol (VITAMIN D2) 50000 UNITS capsule Take 50,000 Units by mouth once a week. Wednesday  . fenofibrate micronized (LOFIBRA) 200 MG capsule Take 1 capsule (200 mg total) by mouth daily.  . ferrous sulfate 325 (65 FE) MG tablet Take 325 mg by mouth.  . Fluticasone-Salmeterol (ADVAIR) 250-50 MCG/DOSE AEPB Inhale 1 puff into the lungs 2 (two) times daily.  Marland Kitchen gabapentin (NEURONTIN) 100 MG capsule Take 1 capsule (100 mg total) by mouth at bedtime.  Marland Kitchen glucose blood test strip Use 3x a day - One TOUch Ultra  . Insulin Glargine (BASAGLAR KWIKPEN) 100 UNIT/ML SOPN INJECT 0.4 MLS (40  UNITS TOTAL) INTO THE SKIN AT BEDTIME.  . Insulin Pen Needle (BD PEN NEEDLE NANO U/F) 32G X 4 MM MISC USE 4 TIMES DAILY  . pantoprazole (PROTONIX) 40 MG tablet TAKE 1 TABLET BY MOUTH EVERY DAY  . pramipexole (MIRAPEX) 1 MG tablet TAKE 1 TABLET BY MOUTH AT BEDTIME.  . predniSONE (DELTASONE) 50 MG tablet 1 tablet by mouth daily  . sertraline (ZOLOFT) 50 MG tablet TAKE 1 AND 1/2 TABLETS DAILY BY MOUTH  . torsemide (DEMADEX) 20 MG tablet TAKE FOUR TABLETS BY MOUTH TWICE DAILY  . [DISCONTINUED] carvedilol (COREG) 6.25 MG tablet TAKE 1 TABLET (6.25 MG TOTAL) BY MOUTH 2 (TWO) TIMES DAILY WITH A MEAL.     Allergies:   Bee venom   Social History   Tobacco Use  . Smoking status: Former Smoker    Packs/day: 1.50    Years: 20.00    Pack years: 30.00    Types: Cigarettes    Quit date: 08/02/1984    Years since quitting: 34.9  . Smokeless tobacco: Never Used  Substance Use Topics  . Alcohol use: No  . Drug use: No     Family Hx: The patient's family history includes Alzheimer's disease in her mother; Arthritis in her brother; Asthma in her father; Cancer (age of onset: 29) in her father; Heart disease in her father, maternal uncle, maternal uncle, and maternal uncle; Heart disease (age of onset: 37) in her sister; Hyperlipidemia in her mother; Hypertension in her father and mother; Pulmonary fibrosis in her sister; Stomach cancer in her paternal grandfather; Stroke in her father and maternal uncle; Uterine cancer in her paternal grandmother.  ROS:   Please see the history of present illness.    All other systems reviewed and are negative.   Prior CV studies:   The following studies were reviewed today:    Labs/Other Tests and Data Reviewed:    EKG:  No ECG reviewed.  Recent Labs: 01/23/2019: ALT 17; BUN 51; Creatinine, Ser 1.54; Potassium 4.1; Sodium 138 06/14/2019: Hemoglobin 9.5; Platelets 110.0   Recent Lipid Panel Lab Results  Component Value Date/Time   CHOL 138 01/23/2019  10:38 AM   TRIG 118.0 01/23/2019 10:38 AM   HDL 46.90 01/23/2019 10:38 AM   CHOLHDL 3 01/23/2019 10:38 AM   LDLCALC 68 01/23/2019 10:38 AM   LDLDIRECT 54.0 07/12/2017 11:15 AM    Wt Readings from Last 3 Encounters:  06/26/19 220 lb (99.8 kg)  06/14/19 203 lb (92.1 kg)  01/23/19 203 lb 6.4 oz (92.3 kg)     Objective:    Vital Signs:  BP (!) 168/57   Ht 5' 2.5" (1.588 m)   Wt 220 lb (99.8 kg)   BMI 39.60 kg/m    No assessment via phone. Daughter in law speaks with me today.  ASSESSMENT & PLAN:    1. Hypertension: BP is elevated. Review of her reading as they are reported by phone range in the 160's/80's. I will increase her coreg from 6.25 mg BID to 9.375 mg BID, for better control. She is due to see PCP in 3 weeks. At that time BP can be assessed.   2. Hx of CVA: Continues to have residual dysphasia and weakness. She is well cared for by her family. Followed by PCP and neurology.   3. CKD: She is on high dose torsemide.  Needs to have BMET. She will request this from her PCP on the next visit due in 3 weeks. This will allow [place] to better manage her medications.   4. HL: Continue atorvastatin. Goal of LDL < 70.  COVID-19 Education: The signs and symptoms of COVID-19 were discussed with the patient and how to seek care for testing (follow up with PCP or arrange E-visit). The importance of social distancing was discussed today.  Time:   Today, I have spent 15 minutes with the patient with telehealth technology discussing the above problems.     Medication Adjustments/Labs and Tests Ordered: Current medicines are reviewed at length with the patient today.  Concerns regarding medicines are outlined above.   Tests Ordered: No orders of the defined types were placed in this encounter.   Medication Changes: Meds ordered this encounter  Medications  . carvedilol (COREG) 6.25 MG tablet    Sig: Take 1.5 tablets (9.375 mg total) by mouth 2 (two) times daily with a meal.     Dispense:  270 tablet    Refill:  3    Disposition:  Follow up 3 months   Signed, Phill Myron. West Pugh, ANP, [facility]  06/26/2019 8:37 AM    [facility]

## 2019-06-26 ENCOUNTER — Encounter: Payer: Self-pay | Admitting: Adult Health

## 2019-06-26 ENCOUNTER — Telehealth (INDEPENDENT_AMBULATORY_CARE_PROVIDER_SITE_OTHER): Payer: Medicare Other | Admitting: Adult Health

## 2019-06-26 VITALS — BP 168/57 | Ht 62.5 in | Wt 220.0 lb

## 2019-06-26 DIAGNOSIS — I639 Cerebral infarction, unspecified: Secondary | ICD-10-CM

## 2019-06-26 DIAGNOSIS — Z7189 Other specified counseling: Secondary | ICD-10-CM

## 2019-06-26 DIAGNOSIS — I10 Essential (primary) hypertension: Secondary | ICD-10-CM

## 2019-06-26 DIAGNOSIS — I5032 Chronic diastolic (congestive) heart failure: Secondary | ICD-10-CM

## 2019-06-26 DIAGNOSIS — E7800 Pure hypercholesterolemia, unspecified: Secondary | ICD-10-CM

## 2019-06-26 DIAGNOSIS — N189 Chronic kidney disease, unspecified: Secondary | ICD-10-CM

## 2019-06-26 DIAGNOSIS — E785 Hyperlipidemia, unspecified: Secondary | ICD-10-CM

## 2019-06-26 DIAGNOSIS — I69320 Aphasia following cerebral infarction: Secondary | ICD-10-CM

## 2019-06-26 DIAGNOSIS — I129 Hypertensive chronic kidney disease with stage 1 through stage 4 chronic kidney disease, or unspecified chronic kidney disease: Secondary | ICD-10-CM

## 2019-06-26 MED ORDER — CARVEDILOL 6.25 MG PO TABS: 9.3750 mg | ORAL_TABLET | Freq: Two times a day (BID) | ORAL | 3 refills | Status: DC

## 2019-06-26 NOTE — Patient Instructions (Signed)
Medication Instructions:  INCREASE- Carvedilol 9.375 mg( 1 1/2 tablets) by mouth twice a day  *If you need a refill on your cardiac medications before your next appointment, please call your pharmacy*   Lab Work: None Ordered   Testing/Procedures: None Ordered   Follow-Up: At [facility], you and your health needs are our priority.  As part of our continuing mission to provide you with exceptional heart care, we have created designated Provider Care Teams.  These Care Teams include your primary Cardiologist (physician) and Advanced Practice Providers (APPs -  Physician Assistants and Nurse Practitioners) who all work together to provide you with the care you need, when you need it.  We recommend signing up for the patient portal called "MyChart".  Sign up information is provided on this After Visit Summary.  MyChart is used to connect with patients for Virtual Visits (Telemedicine).  Patients are able to view lab/test results, encounter notes, upcoming appointments, etc.  Non-urgent messages can be sent to your provider as well.   To learn more about what you can do with MyChart, go to [email or web address].    Your next appointment:   3 month(s)  The format for your next appointment:   In Person  Provider:   You may see Kirk Ruths, MD or one of the following Advanced Practice Providers on your designated Care Team:    Kerin Ransom, PA-C  [place], [place]  Coletta Memos, [place]

## 2019-07-02 DIAGNOSIS — G4733 Obstructive sleep apnea (adult) (pediatric): Secondary | ICD-10-CM | POA: Diagnosis not present

## 2019-07-02 DIAGNOSIS — J310 Chronic rhinitis: Secondary | ICD-10-CM | POA: Diagnosis not present

## 2019-07-02 DIAGNOSIS — R5383 Other fatigue: Secondary | ICD-10-CM | POA: Diagnosis not present

## 2019-07-02 DIAGNOSIS — J4520 Mild intermittent asthma, uncomplicated: Secondary | ICD-10-CM | POA: Diagnosis not present

## 2019-07-15 DIAGNOSIS — S6992XA Unspecified injury of left wrist, hand and finger(s), initial encounter: Secondary | ICD-10-CM | POA: Diagnosis not present

## 2019-07-19 ENCOUNTER — Ambulatory Visit (INDEPENDENT_AMBULATORY_CARE_PROVIDER_SITE_OTHER): Payer: Medicare Other | Admitting: *Deleted

## 2019-07-19 DIAGNOSIS — I639 Cerebral infarction, unspecified: Secondary | ICD-10-CM | POA: Diagnosis not present

## 2019-07-22 LAB — CUP PACEART REMOTE DEVICE CHECK
Date Time Interrogation Session: 20210418013214
Implantable Pulse Generator Implant Date: 20190429

## 2019-07-24 ENCOUNTER — Ambulatory Visit (INDEPENDENT_AMBULATORY_CARE_PROVIDER_SITE_OTHER): Payer: Medicare Other | Admitting: Family Medicine

## 2019-07-24 ENCOUNTER — Other Ambulatory Visit: Payer: Self-pay

## 2019-07-24 ENCOUNTER — Encounter: Payer: Self-pay | Admitting: Family Medicine

## 2019-07-24 VITALS — BP 122/70 | HR 72 | Temp 97.2°F | Resp 18 | Ht 62.5 in | Wt 217.4 lb

## 2019-07-24 DIAGNOSIS — E559 Vitamin D deficiency, unspecified: Secondary | ICD-10-CM

## 2019-07-24 DIAGNOSIS — E1169 Type 2 diabetes mellitus with other specified complication: Secondary | ICD-10-CM

## 2019-07-24 DIAGNOSIS — I10 Essential (primary) hypertension: Secondary | ICD-10-CM | POA: Diagnosis not present

## 2019-07-24 DIAGNOSIS — E1165 Type 2 diabetes mellitus with hyperglycemia: Secondary | ICD-10-CM | POA: Diagnosis not present

## 2019-07-24 DIAGNOSIS — M1A09X0 Idiopathic chronic gout, multiple sites, without tophus (tophi): Secondary | ICD-10-CM

## 2019-07-24 DIAGNOSIS — E785 Hyperlipidemia, unspecified: Secondary | ICD-10-CM | POA: Diagnosis not present

## 2019-07-24 DIAGNOSIS — I639 Cerebral infarction, unspecified: Secondary | ICD-10-CM

## 2019-07-24 LAB — MICROALBUMIN / CREATININE URINE RATIO
Creatinine,U: 57 mg/dL
Microalb Creat Ratio: 11.8 mg/g (ref 0.0–30.0)
Microalb, Ur: 6.7 mg/dL — ABNORMAL HIGH (ref 0.0–1.9)

## 2019-07-24 LAB — LIPID PANEL
Cholesterol: 122 mg/dL (ref 0–200)
HDL: 41.3 mg/dL (ref >39.00)
LDL Cholesterol: 51 mg/dL (ref 0–99)
NonHDL: 80.34
Total CHOL/HDL Ratio: 3
Triglycerides: 147 mg/dL (ref 0.0–149.0)
VLDL: 29.4 mg/dL (ref 0.0–40.0)

## 2019-07-24 LAB — VITAMIN D 25 HYDROXY (VIT D DEFICIENCY, FRACTURES): VITD: 28.42 ng/mL — ABNORMAL LOW (ref 30.00–100.00)

## 2019-07-24 LAB — COMPREHENSIVE METABOLIC PANEL
ALT: 17 U/L (ref 0–35)
AST: 27 U/L (ref 0–37)
Albumin: 4.3 g/dL (ref 3.5–5.2)
Alkaline Phosphatase: 60 U/L (ref 39–117)
BUN: 59 mg/dL — ABNORMAL HIGH (ref 6–23)
CO2: 27 mEq/L (ref 19–32)
Calcium: 9.1 mg/dL (ref 8.4–10.5)
Chloride: 103 mEq/L (ref 96–112)
Creatinine, Ser: 1.51 mg/dL — ABNORMAL HIGH (ref 0.40–1.20)
GFR: 33.66 mL/min — ABNORMAL LOW (ref >60.00)
Glucose, Bld: 97 mg/dL (ref 70–99)
Potassium: 4.3 mEq/L (ref 3.5–5.1)
Sodium: 140 mEq/L (ref 135–145)
Total Bilirubin: 0.6 mg/dL (ref 0.2–1.2)
Total Protein: 6.7 g/dL (ref 6.0–8.3)

## 2019-07-24 LAB — URIC ACID: Uric Acid, Serum: 7.5 mg/dL — ABNORMAL HIGH (ref 2.4–7.0)

## 2019-07-24 LAB — HEMOGLOBIN A1C: Hgb A1c MFr Bld: 5.9 % (ref 4.6–6.5)

## 2019-07-24 NOTE — Patient Instructions (Signed)
Carbohydrate Counting for Diabetes Mellitus, Adult  Carbohydrate counting is a method of keeping track of how many carbohydrates you eat. Eating carbohydrates naturally increases the amount of sugar (glucose) in the blood. Counting how many carbohydrates you eat helps keep your blood glucose within normal limits, which helps you manage your diabetes (diabetes mellitus). It is important to know how many carbohydrates you can safely have in each meal. This is different for every person. A diet and nutrition specialist (registered dietitian) can help you make a meal plan and calculate how many carbohydrates you should have at each meal and snack. Carbohydrates are found in the following foods:  Grains, such as breads and cereals.  Dried beans and soy products.  Starchy vegetables, such as potatoes, peas, and corn.  Fruit and fruit juices.  Milk and yogurt.  Sweets and snack foods, such as cake, cookies, candy, chips, and soft drinks. How do I count carbohydrates? There are two ways to count carbohydrates in food. You can use either of the methods or a combination of both. Reading "Nutrition Facts" on packaged food The "Nutrition Facts" list is included on the labels of almost all packaged foods and beverages in the [place] It includes:  The serving size.  Information about nutrients in each serving, including the grams (g) of carbohydrate per serving. To use the "Nutrition Facts":  Decide how many servings you will have.  Multiply the number of servings by the number of carbohydrates per serving.  The resulting number is the total amount of carbohydrates that you will be having. Learning standard serving sizes of other foods When you eat carbohydrate foods that are not packaged or do not include "Nutrition Facts" on the label, you need to measure the servings in order to count the amount of carbohydrates:  Measure the foods that you will eat with a food scale or measuring cup, if  needed.  Decide how many standard-size servings you will eat.  Multiply the number of servings by 15. Most carbohydrate-rich foods have about 15 g of carbohydrates per serving. ? For example, if you eat 8 oz (170 g) of strawberries, you will have eaten 2 servings and 30 g of carbohydrates (2 servings x 15 g = 30 g).  For foods that have more than one food mixed, such as soups and casseroles, you must count the carbohydrates in each food that is included. The following list contains standard serving sizes of common carbohydrate-rich foods. Each of these servings has about 15 g of carbohydrates:   hamburger bun or  English muffin.   oz (15 mL) syrup.   oz (14 g) jelly.  1 slice of bread.  1 six-inch tortilla.  3 oz (85 g) cooked rice or pasta.  4 oz (113 g) cooked dried beans.  4 oz (113 g) starchy vegetable, such as peas, corn, or potatoes.  4 oz (113 g) hot cereal.  4 oz (113 g) mashed potatoes or  of a large baked potato.  4 oz (113 g) canned or frozen fruit.  4 oz (120 mL) fruit juice.  4-6 crackers.  6 chicken nuggets.  6 oz (170 g) unsweetened dry cereal.  6 oz (170 g) plain fat-free yogurt or yogurt sweetened with artificial sweeteners.  8 oz (240 mL) milk.  8 oz (170 g) fresh fruit or one small piece of fruit.  24 oz (680 g) popped popcorn. Example of carbohydrate counting Sample meal  3 oz (85 g) chicken breast.  6 oz (170 g)   brown rice.  4 oz (113 g) corn.  8 oz (240 mL) milk.  8 oz (170 g) strawberries with sugar-free whipped topping. Carbohydrate calculation 1. Identify the foods that contain carbohydrates: ? Rice. ? Corn. ? Milk. ? Strawberries. 2. Calculate how many servings you have of each food: ? 2 servings rice. ? 1 serving corn. ? 1 serving milk. ? 1 serving strawberries. 3. Multiply each number of servings by 15 g: ? 2 servings rice x 15 g = 30 g. ? 1 serving corn x 15 g = 15 g. ? 1 serving milk x 15 g = 15 g. ? 1  serving strawberries x 15 g = 15 g. 4. Add together all of the amounts to find the total grams of carbohydrates eaten: ? 30 g + 15 g + 15 g + 15 g = 75 g of carbohydrates total. Summary  Carbohydrate counting is a method of keeping track of how many carbohydrates you eat.  Eating carbohydrates naturally increases the amount of sugar (glucose) in the blood.  Counting how many carbohydrates you eat helps keep your blood glucose within normal limits, which helps you manage your diabetes.  A diet and nutrition specialist (registered dietitian) can help you make a meal plan and calculate how many carbohydrates you should have at each meal and snack. This information is not intended to replace advice given to you by your health care provider. Make sure you discuss any questions you have with your health care provider. Document Revised: 10/14/2016 Document Reviewed: 09/03/2015 Elsevier Patient Education  2020 [employer]  

## 2019-07-24 NOTE — Assessment & Plan Note (Signed)
Stable No new symptoms

## 2019-07-24 NOTE — Progress Notes (Signed)
Patient ID: Erin [name], female    DOB: 1945/10/21  Age: 74 y.o. MRN: [id number]    Subjective:  Subjective  HPI Erin [name] presents for f/u -- her daughter is present    HPI Erin [name]   Blood pressure range-not checking   Chest pain- no      Dyspnea- no Lightheadedness- no   Edema- no  Other side effects - no   Medication compliance: good Low salt diet- yes    Erin [name]    Blood Sugar ranges-good per pt daughter   Polyuria- no New Visual problems- no  Hypoglycemic symptoms- no  Other side effects-no Medication compliance - good Last eye exam- due Foot exam- today   HYPERLIPIDEMIA  Medication compliance- good RUQ pain- no  Muscle aches- no Other side effects-no       Review of Systems  Constitutional: Negative for activity change, appetite change, fatigue, fever and unexpected weight change.  HENT: Negative for congestion.   Respiratory: Negative for cough and shortness of breath.   Cardiovascular: Negative for chest pain, palpitations and leg swelling.  Gastrointestinal: Negative for vomiting.  Musculoskeletal: Negative for back pain.  Skin: Negative for rash.  Neurological: Negative for headaches.  Psychiatric/Behavioral: Positive for confusion and decreased concentration. Negative for behavioral problems and dysphoric mood. The patient is not nervous/anxious.     History Past Medical History:  Diagnosis Date  . Asthma   . CHF (congestive heart failure) [facility])    hospital 11/2013-- [place]  . Chronic kidney disease   . Depression   . Erin [name] mellitus ([place])   . GERD (gastroesophageal reflux disease)   . Hyperlipidemia   . Erin [name]   . Stroke [facility])     She has a past surgical history that includes Cholecystectomy (2002); Tubal ligation; Tonsillectomy; Spine surgery (aug 2015); LOOP RECORDER INSERTION (N/A, 08/01/2017); and TEE without cardioversion (N/A, 08/01/2017).   Her family history includes Alzheimer's disease in her mother;  Arthritis in her brother; Asthma in her father; Cancer (age of onset: 60) in her father; Heart disease in her father, maternal uncle, maternal uncle, and maternal uncle; Heart disease (age of onset: 74) in her sister; Hyperlipidemia in her mother; Erin [name] in her father and mother; Pulmonary fibrosis in her sister; Stomach cancer in her paternal grandfather; Stroke in her father and maternal uncle; Uterine cancer in her paternal grandmother.She reports that she quit smoking about 34 years ago. Her smoking use included cigarettes. She has a 30.00 pack-year smoking history. She has never used smokeless tobacco. She reports that she does not drink alcohol or use drugs.  Current Outpatient Medications on File Prior to Visit  Medication Sig Dispense Refill  . Acetaminophen (TYLENOL 8 HOUR PO) Take 500 mg by mouth.    Marland Kitchen albuterol (PROVENTIL) (2.5 MG/3ML) 0.083% nebulizer solution Take 3 mLs (2.5 mg total) by nebulization every 6 (six) hours as needed for wheezing or shortness of breath. 75 mL 12  . albuterol (VENTOLIN HFA) 108 (90 Base) MCG/ACT inhaler Inhale 2 puffs into the lungs every 4 (four) hours as needed for wheezing or shortness of breath.     . allopurinol (ZYLOPRIM) 100 MG tablet TAKE 1 TABLET BY MOUTH EVERY DAY 90 tablet 1  . AMBULATORY NON FORMULARY MEDICATION Rollator 1 Units 0  . atorvastatin (LIPITOR) 40 MG tablet TAKE 1 TABLET BY MOUTH EVERYDAY AT BEDTIME 90 tablet 0  . busPIRone (BUSPAR) 15 MG tablet TAKE 1 TABLET BY MOUTH 3 TIMES DAILY. 270 tablet 1  .  carvedilol (COREG) 6.25 MG tablet Take 1.5 tablets (9.375 mg total) by mouth 2 (two) times daily with a meal. 270 tablet 3  . clopidogrel (PLAVIX) 75 MG tablet TAKE 1 TABLET BY MOUTH EVERY DAY 90 tablet 1  . diclofenac sodium (VOLTAREN) 1 % GEL Apply 4 g topically 4 (four) times daily. 100 g 2  . EPINEPHrine 0.3 mg/0.3 mL IJ SOAJ injection PLEASE SEE ATTACHED FOR DETAILED DIRECTIONS    . ergocalciferol (VITAMIN D2) 50000 UNITS capsule  Take 50,000 Units by mouth once a week. Wednesday    . ferrous sulfate 325 (65 FE) MG tablet Take 325 mg by mouth.    . Fluticasone-Salmeterol (ADVAIR) 250-50 MCG/DOSE AEPB Inhale 1 puff into the lungs 2 (two) times daily.    Marland Kitchen gabapentin (NEURONTIN) 100 MG capsule Take 1 capsule (100 mg total) by mouth at bedtime. 180 capsule 0  . glucose blood test strip Use 3x a day - One TOUch Ultra 300 each 3  . Insulin Glargine (BASAGLAR KWIKPEN) 100 UNIT/ML SOPN INJECT 0.4 MLS (40 UNITS TOTAL) INTO THE SKIN AT BEDTIME. 15 pen 3  . Insulin Pen Needle (BD PEN NEEDLE NANO U/F) 32G X 4 MM MISC USE 4 TIMES DAILY 300 each 3  . pantoprazole (PROTONIX) 40 MG tablet TAKE 1 TABLET BY MOUTH EVERY DAY 90 tablet 1  . pramipexole (MIRAPEX) 1 MG tablet TAKE 1 TABLET BY MOUTH AT BEDTIME. 90 tablet 1  . sertraline (ZOLOFT) 50 MG tablet TAKE 1 AND 1/2 TABLETS DAILY BY MOUTH 135 tablet 1  . torsemide (DEMADEX) 20 MG tablet TAKE FOUR TABLETS BY MOUTH TWICE DAILY    . fenofibrate micronized (LOFIBRA) 200 MG capsule Take 1 capsule (200 mg total) by mouth daily. 90 capsule 1   No current facility-administered medications on file prior to visit.     Objective:  Objective  Physical Exam Vitals and nursing note reviewed.  Constitutional:      Appearance: She is well-developed.  HENT:     Head: Normocephalic and atraumatic.  Eyes:     Conjunctiva/sclera: Conjunctivae normal.  Neck:     Thyroid: No thyromegaly.     Vascular: No carotid bruit or JVD.  Cardiovascular:     Rate and Rhythm: Normal rate and regular rhythm.     Heart sounds: Normal heart sounds. No murmur.  Pulmonary:     Effort: Pulmonary effort is normal. No respiratory distress.     Breath sounds: Normal breath sounds. No wheezing or rales.  Chest:     Chest wall: No tenderness.  Musculoskeletal:     Cervical back: Normal range of motion and neck supple.  Neurological:     Mental Status: She is alert and oriented to person, place, and time.    BP  122/70 (BP Location: Right Arm, Patient Position: Sitting, Cuff Size: Large)   Pulse 72   Temp (!) 97.2 F (36.2 C) (Temporal)   Resp 18   Ht 5' 2.5" (1.588 m)   Wt 217 lb 6.4 oz (98.6 kg)   SpO2 98%   BMI 39.13 kg/m  Wt Readings from Last 3 Encounters:  07/24/19 217 lb 6.4 oz (98.6 kg)  06/26/19 220 lb (99.8 kg)  06/14/19 203 lb (92.1 kg)     Lab Results  Component Value Date   WBC 7.6 06/14/2019   HGB 9.5 (L) 06/14/2019   HCT 28.8 (L) 06/14/2019   PLT 110.0 (L) 06/14/2019   GLUCOSE 90 01/23/2019   CHOL 138 01/23/2019  TRIG 118.0 01/23/2019   HDL 46.90 01/23/2019   LDLDIRECT 54.0 07/12/2017   LDLCALC 68 01/23/2019   ALT 17 01/23/2019   AST 28 01/23/2019   NA 138 01/23/2019   K 4.1 01/23/2019   CL 101 01/23/2019   CREATININE 1.54 (H) 01/23/2019   BUN 51 (H) 01/23/2019   CO2 27 01/23/2019   TSH 2.29 05/17/2011   HGBA1C 5.9 01/23/2019   MICROALBUR 2.0 (H) 12/19/2017    DG INJECT DIAG/THERA/INC NEEDLE/CATH/PLC EPI/LUMB/SAC W/IMG  Result Date: 05/02/2019 CLINICAL DATA:  Lumbosacral spondylosis without myelopathy. Low back and right leg pain. Positive response to an epidural injection last year. FLUOROSCOPY TIME:  Fluoroscopy Time: 25 seconds Radiation Exposure Index: 35.64 microGray*m^2 PROCEDURE: The procedure, risks, benefits, and alternatives were explained to the patient. Questions regarding the procedure were encouraged and answered. The patient understands and consents to the procedure. LUMBAR EPIDURAL INJECTION: An interlaminar approach was performed on the right at L5-S1. The overlying skin was cleansed and anesthetized. A 3.5 inch 20 gauge epidural needle was advanced using loss-of-resistance technique. DIAGNOSTIC EPIDURAL INJECTION: Injection of Isovue-M 200 shows a good epidural pattern with spread above and below the level of needle placement, primarily on the right. No vascular opacification is seen. THERAPEUTIC EPIDURAL INJECTION: 120 mg of Depo-Medrol mixed  with 3 mL of 1% lidocaine were instilled. The procedure was well-tolerated, and the patient was discharged thirty minutes following the injection in good condition. COMPLICATIONS: None IMPRESSION: Technically successful interlaminar epidural injection on the right at L5-S1. Electronically Signed   By: Logan Bores M.D.   On: 05/02/2019 11:58     Assessment & Plan:  Plan  I have discontinued Sammye B. Signer's predniSONE. I am also having her maintain her ergocalciferol, albuterol, Fluticasone-Salmeterol, torsemide, ferrous sulfate, albuterol, Acetaminophen (TYLENOL 8 HOUR PO), glucose blood, fenofibrate micronized, diclofenac sodium, BD Pen Needle Nano U/F, Basaglar KwikPen, EPINEPHrine, busPIRone, allopurinol, clopidogrel, pramipexole, gabapentin, pantoprazole, atorvastatin, AMBULATORY NON FORMULARY MEDICATION, sertraline, and carvedilol.  1. Idiopathic chronic gout of multiple sites without tophus stable - Uric acid  2. 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 Increase exercise as tolerated. Continue current meds  - Hemoglobin A1c - Microalbumin / creatinine urine ratio  3. Essential Erin [name] Well controlled, no changes to meds. Encouraged heart healthy diet such as the DASH diet and exercise as tolerated.  - Comprehensive metabolic panel  4. Hyperlipidemia associated with type 2 Erin [name] mellitus ([place]) Encouraged heart healthy diet, increase exercise, avoid trans fats, consider a krill oil cap daily - Lipid panel - Comprehensive metabolic panel  5. Vitamin D deficiency Check labs  - Vitamin D (25 hydroxy)  6. Cryptogenic stroke ([place]) stable  No orders of the defined types were placed in this encounter.   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  Ann Held, DO

## 2019-07-25 ENCOUNTER — Other Ambulatory Visit: Payer: Self-pay | Admitting: Family Medicine

## 2019-07-25 NOTE — Progress Notes (Signed)
[place] Phone: [phone] Subjective:   Erin [name], am serving as a scribe for Dr. Hulan Saas. This visit occurred during the SARS-CoV-2 public health emergency.  Safety protocols were in place, including screening questions prior to the visit, additional usage of staff PPE, and extensive cleaning of exam room while observing appropriate contact time as indicated for disinfecting solutions.   I'm seeing this patient by the request  of:  Ann Held, DO  CC: Low back pain follow-up  [id number]   06/14/2019 To have exacerbation of a chronic problem.  Back pain with radiation.  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  Very minimal results but with patient's epidural.  Patient does have many comorbidities that make treatment very difficult.  Toradol and Depo-Medrol given today to help with some of the pain.  Discussed icing regimen and home exercises, we discussed different medications.  We discussed how other problems including patient's chronic anemia could be contributing as well and we discussed potentially getting laboratory work-up.  We will put in future orders and await patient seen her and her primary care in the near future.  Discussed repeating the epidural more time before we consider the possibility of surgical intervention.  Total time with patient and reviewing imaging and labs 42 minutes.  Update 07/26/2019 Erin [name] is a 74 y.o. female coming in with complaint of back pain. Patient states that her back pain has increased since last visit. Right leg over quad tendon is painful. Patient fell since last visit.   Pain in left leg in the calf. Patient calls out in pain in the room today.  Patient states that she does not know if it is from her back.  Patient's daughter states that it has been hurting her more recurrent frequently as  well.       Past Medical History:  Diagnosis Date  . Asthma   . CHF (congestive heart failure) [facility])    hospital 11/2013-- [place]  . Chronic kidney disease   . Depression   . Diabetes mellitus ([place])   . GERD (gastroesophageal reflux disease)   . Hyperlipidemia   . Hypertension   . Stroke [facility])    Past Surgical History:  Procedure Laterality Date  . CHOLECYSTECTOMY  2002  . LOOP RECORDER INSERTION N/A 08/01/2017   Procedure: LOOP RECORDER INSERTION;  Surgeon: Constance Haw, MD;  Location: [place] CV LAB;  Service: Cardiovascular;  Laterality: N/A;  . SPINE SURGERY  aug 2015   [facility]  . TEE WITHOUT CARDIOVERSION N/A 08/01/2017   Procedure: TRANSESOPHAGEAL ECHOCARDIOGRAM (TEE);  Surgeon: Sanda Klein, MD;  Location: [facility] ENDOSCOPY;  Service: Cardiovascular;  Laterality: N/A;  . TONSILLECTOMY    . TUBAL LIGATION     Social History   Socioeconomic History  . Marital status: Widowed    Spouse name: Not on file  . Number of children: 4  . Years of education: Not on file  . Highest education level: Not on file  Occupational History  . Occupation: [employer] @ [place]: [employer]  Tobacco Use  . Smoking status: Former Smoker    Packs/day: 1.50    Years: 20.00    Pack years: 30.00    Types: Cigarettes    Quit date: 08/02/1984    Years since quitting: 35.0  . Smokeless tobacco: Never Used  Substance and  Sexual Activity  . Alcohol use: [name]  . Drug use: [name]  . Sexual activity: Not Currently    Partners: Male  Other Topics Concern  . Not on file  Social History Narrative   [name] REG EXERCISE   Caffeine use: daily   Social Determinants of Health   Financial Resource Strain:   . Difficulty of Paying Living Expenses:   Food Insecurity:   . Worried About Charity fundraiser in the Last Year:   . Arboriculturist in the Last Year:   Transportation Needs:   . Film/video editor (Medical):   Marland Kitchen Lack of Transportation (Non-Medical):    Physical Activity:   . Days of Exercise per Week:   . Minutes of Exercise per Session:   Stress:   . Feeling of Stress :   Social Connections:   . Frequency of Communication with Friends and Family:   . Frequency of Social Gatherings with Friends and Family:   . Attends Religious Services:   . Active Member of Clubs or Organizations:   . Attends Archivist Meetings:   Marland Kitchen Marital Status:    Allergies  Allergen Reactions  . Bee Venom    Family History  Problem Relation Age of Onset  . Hypertension Mother   . Alzheimer's disease Mother   . Hyperlipidemia Mother   . Heart disease Father        cad  . Asthma Father   . Cancer Father 50       leukemia  . Stroke Father   . Hypertension Father   . Heart disease Sister 13       MI  . Pulmonary fibrosis Sister   . Arthritis Brother   . Heart disease Maternal Uncle   . Stroke Maternal Uncle   . Uterine cancer Paternal Grandmother   . Stomach cancer Paternal Grandfather   . Heart disease Maternal Uncle   . Heart disease Maternal Uncle     Current Outpatient Medications (Endocrine & Metabolic):  Marland Kitchen  Insulin Glargine (BASAGLAR KWIKPEN) 100 UNIT/ML SOPN, INJECT 0.4 MLS (40 UNITS TOTAL) INTO THE SKIN AT BEDTIME.  Current Outpatient Medications (Cardiovascular):  .  atorvastatin (LIPITOR) 40 MG tablet, TAKE 1 TABLET BY MOUTH EVERYDAY AT BEDTIME .  carvedilol (COREG) 6.25 MG tablet, Take 1.5 tablets (9.375 mg total) by mouth 2 (two) times daily with a meal. .  EPINEPHrine 0.3 mg/0.3 mL IJ SOAJ injection, PLEASE SEE ATTACHED FOR DETAILED DIRECTIONS .  fenofibrate micronized (LOFIBRA) 200 MG capsule, Take 1 capsule (200 mg total) by mouth daily. Marland Kitchen  torsemide (DEMADEX) 20 MG tablet, TAKE FOUR TABLETS BY MOUTH TWICE DAILY  Current Outpatient Medications (Respiratory):  .  albuterol (PROVENTIL) (2.5 MG/3ML) 0.083% nebulizer solution, Take 3 mLs (2.5 mg total) by nebulization every 6 (six) hours as needed for wheezing or  shortness of breath. Marland Kitchen  albuterol (VENTOLIN HFA) 108 (90 Base) MCG/ACT inhaler, Inhale 2 puffs into the lungs every 4 (four) hours as needed for wheezing or shortness of breath.  .  Fluticasone-Salmeterol (ADVAIR) 250-50 MCG/DOSE AEPB, Inhale 1 puff into the lungs 2 (two) times daily.  Current Outpatient Medications (Analgesics):  Marland Kitchen  Acetaminophen (TYLENOL 8 HOUR PO), Take 500 mg by mouth. Marland Kitchen  allopurinol (ZYLOPRIM) 100 MG tablet, TAKE 1 TABLET BY MOUTH EVERY DAY  Current Outpatient Medications (Hematological):  .  clopidogrel (PLAVIX) 75 MG tablet, TAKE 1 TABLET BY MOUTH EVERY DAY .  ferrous sulfate 325 (65 FE) MG tablet,  Take 325 mg by mouth.  Current Outpatient Medications (Other):  Marland Kitchen  AMBULATORY NON FORMULARY MEDICATION, Rollator .  busPIRone (BUSPAR) 15 MG tablet, TAKE 1 TABLET BY MOUTH 3 TIMES DAILY. Marland Kitchen  diclofenac sodium (VOLTAREN) 1 % GEL, Apply 4 g topically 4 (four) times daily. .  ergocalciferol (VITAMIN D2) 50000 UNITS capsule, Take 50,000 Units by mouth once a week. Wednesday .  gabapentin (NEURONTIN) 100 MG capsule, Take 1 capsule (100 mg total) by mouth at bedtime. Marland Kitchen  glucose blood test strip, Use 3x a day - One TOUch Ultra .  Insulin Pen Needle (BD PEN NEEDLE NANO U/F) 32G X 4 MM MISC, USE 4 TIMES DAILY .  pantoprazole (PROTONIX) 40 MG tablet, TAKE 1 TABLET BY MOUTH EVERY DAY .  pramipexole (MIRAPEX) 1 MG tablet, TAKE 1 TABLET BY MOUTH AT BEDTIME. Marland Kitchen  sertraline (ZOLOFT) 50 MG tablet, TAKE 1 AND 1/2 TABLETS DAILY BY MOUTH   Reviewed prior external information including notes and imaging from  primary care provider As well as notes that were available from care everywhere and other healthcare systems.  Past medical history, social, surgical and family history all reviewed in electronic medical record.  [name] pertanent information unless stated regarding to the chief complaint.   Review of Systems:  [name] headache, visual changes, nausea, vomiting, diarrhea, constipation,  dizziness, abdominal pain, skin rash, fevers, chills, night sweats, weight loss, swollen lymph nodes, , joint swelling, chest pain, shortness of breath, mood changes. POSITIVE muscle aches, body aches  Objective  Blood pressure (!) 144/66, pulse 73, height 5' 2.5" (1.588 m), SpO2 99 %.   General: Appears uncomfortable HEENT: Pupils equal, extraocular movements intact  Respiratory: Patient's speak in full sentences and does not appear short of breath  Cardiovascular: Trace lower extremity edema, non tender, [name] erythema  Neuro: Cranial nerves II through XII are intact, neurovascularly intact in all extremities with 2+ DTRs and 2+ pulses.  Gait antalgic walking with the aid of a walker MSK: Patient's pain seems to being somewhat out of proportion.  Has multiple different joint pains noted and even to light palpation.  Back is severely tender to palpation just diffusely.  The legs patient feels worse states that she has radicular symptoms with straight leg test of even 10 degrees.     Impression and Recommendations:     This case required medical decision making of moderate complexity. The above documentation has been reviewed and is accurate and complete Lyndal Pulley, DO       Note: This dictation was prepared with Dragon dictation along with smaller phrase technology. Any transcriptional errors that result from this process are unintentional.

## 2019-07-26 ENCOUNTER — Ambulatory Visit (INDEPENDENT_AMBULATORY_CARE_PROVIDER_SITE_OTHER): Payer: Medicare Other | Admitting: Family Medicine

## 2019-07-26 ENCOUNTER — Encounter: Payer: Self-pay | Admitting: Family Medicine

## 2019-07-26 ENCOUNTER — Other Ambulatory Visit: Payer: Self-pay

## 2019-07-26 VITALS — BP 144/66 | HR 73 | Ht 62.5 in

## 2019-07-26 DIAGNOSIS — D649 Anemia, unspecified: Secondary | ICD-10-CM

## 2019-07-26 DIAGNOSIS — M549 Dorsalgia, unspecified: Secondary | ICD-10-CM | POA: Diagnosis not present

## 2019-07-26 DIAGNOSIS — I639 Cerebral infarction, unspecified: Secondary | ICD-10-CM | POA: Diagnosis not present

## 2019-07-26 LAB — IBC PANEL
Iron: 64 ug/dL (ref 42–145)
Saturation Ratios: 14 % — ABNORMAL LOW (ref 20.0–50.0)
Transferrin: 326 mg/dL (ref 212.0–360.0)

## 2019-07-26 LAB — FERRITIN: Ferritin: 106.7 ng/mL (ref 10.0–291.0)

## 2019-07-26 NOTE — Patient Instructions (Addendum)
Good to see you.  Referral placed to Dr. Lynann Bologna They will contact you to schedule. Get labs on your way out.

## 2019-07-26 NOTE — Assessment & Plan Note (Signed)
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  We have attempted epidurals with very short-term relief.  Concern with patient's other comorbidities of adding too many medications at the moment.  Laboratory work-up has shown an anemia so will further evaluate for any iron deficiencies that could be contributing to some of the muscle aches and pains but unfortunately the adjacent segment disease is likely contributing to the decrease in quality of life and difficulty with activities of daily living.  I would like to refer patient to orthopedic back specialist for further evaluation for potential surgical intervention.  Patient as well as her daughter were in agreement with the plan but understand they are there to be evaluated.  No change otherwise in our treatment options at this time.  Spent greater than 35 minutes with patient today discussing patient's imaging, what we have done for treatment and treatment options at this time.

## 2019-07-29 ENCOUNTER — Other Ambulatory Visit: Payer: Self-pay | Admitting: Family Medicine

## 2019-07-29 DIAGNOSIS — E559 Vitamin D deficiency, unspecified: Secondary | ICD-10-CM

## 2019-07-29 MED ORDER — ERGOCALCIFEROL 1.25 MG (50000 UT) PO CAPS: 50000.0000 [IU] | ORAL_CAPSULE | ORAL | 1 refills | Status: DC

## 2019-08-02 ENCOUNTER — Other Ambulatory Visit: Payer: Self-pay

## 2019-08-02 ENCOUNTER — Telehealth (INDEPENDENT_AMBULATORY_CARE_PROVIDER_SITE_OTHER): Payer: Medicare Other | Admitting: Internal Medicine

## 2019-08-02 ENCOUNTER — Other Ambulatory Visit: Payer: Self-pay | Admitting: Family Medicine

## 2019-08-02 ENCOUNTER — Encounter: Payer: Self-pay | Admitting: Internal Medicine

## 2019-08-02 DIAGNOSIS — I639 Cerebral infarction, unspecified: Secondary | ICD-10-CM

## 2019-08-02 DIAGNOSIS — E785 Hyperlipidemia, unspecified: Secondary | ICD-10-CM | POA: Diagnosis not present

## 2019-08-02 DIAGNOSIS — E1121 Type 2 diabetes mellitus with diabetic nephropathy: Secondary | ICD-10-CM

## 2019-08-02 DIAGNOSIS — Z794 Long term (current) use of insulin: Secondary | ICD-10-CM

## 2019-08-02 NOTE — Progress Notes (Signed)
Patient ID: Erin [name], female   DOB: Oct 18, 1945, 74 y.o.   MRN: [id number]  Patient location: son's Home My location: Office Persons participating in the virtual visit: patient, daughter-in-law, provider  Referring Provider: Ann Held, DO  I connected with the patient on 08/02/19 at  3:34 PM EDT by a video enabled telemedicine application and verified that I am speaking with the correct person.   I discussed the limitations of evaluation and management by telemedicine and the availability of in person appointments. The patient expressed understanding and agreed to proceed.   Details of the encounter are shown below.  HPI: Erin [name] is a 74 y.o.-year-old female, returning for f/u for DM2, dx 2013, insulin-dependent, uncontrolled, with complications (CKD, cerebrovascular disease-s/p stroke 04/2017). Last visit 11 months ago.  She is completely nonverbal, history provided by daughter in law.  Patient's sugars improved significantly after her son started to help her with her medicines.  Reviewed HbA1c levels: Lab Results  Component Value Date   HGBA1C 5.9 07/24/2019   HGBA1C 5.9 01/23/2019   HGBA1C 5.8 08/31/2018   HGBA1C 5.1 04/17/2018   HGBA1C 5.7 12/19/2017   HGBA1C 7.2 (H) 07/12/2017   HGBA1C 10.6 (H) 01/25/2017   HGBA1C 8.4 11/08/2016   HGBA1C 12.4 07/23/2016   HGBA1C 11.2 Repeated and verified X2. (H) 01/06/2016  04/27/2017: HbA1c 11.2%  Pt is now on: - Basaglar 40 >> 30 >> 20 >> 16 units at bedtime  We were able to stop NovoLog.  Previously:   Pt was checking her sugars 3 times a day per daughter's review of the logs: - am: 40, 96-132, 143 >> 72, 80-126, 131 >> 64-144 (ave 100) >> 78-143 - 2h after b'fast: 300s >> 185-220 >> n/c - before lunch: 100-129 >> 65, 78-132, 137 >> 78-151 (ave 120) >> 102-158 - 2h after lunch: 300s-400 >> 207-244 >> n/c - before dinner: 109-175, 194 >> 87-150, 174, 180 >> 88, 135-145, 175 >> 101-145, 178 - 2h after  dinner:  n/c >> 290s >> n/c - bedtime: 28413-244 >> n/c >> 130-160, 179 >> n/c Lowest sugar was 40 x1 >> 65 >> 64 >> 78 ; she has hypoglycemia awareness in the 70s. Highest sugar was 400s ...>> 180 >> 175 >> 178.  Meals: - Breakfast: eggs + toast; occas. grits - Lunch: sandwich, soup - Dinner: meat + veggies + starch or sandwich - Snacks: grapes, icecream  -+ CKD.  Last BUN/creatinine:  Lab Results  Component Value Date   BUN 59 (H) 07/24/2019   CREATININE 1.51 (H) 07/24/2019  04/28/2017: GFR 41 04/27/2017: 34/1.45, GFR 34 11/30/2013 [facility] - Cr 1.8   Off lisinopril. -+ HL; last set of lipids: Lab Results  Component Value Date   CHOL 122 07/24/2019   HDL 41.30 07/24/2019   LDLCALC 51 07/24/2019   LDLDIRECT 54.0 07/12/2017   TRIG 147.0 07/24/2019   CHOLHDL 3 07/24/2019  On Lipitor 40, fenofibrate 160.  - last eye exam was in 03/2018: No DR; she has a history of right eye cataract surgery. Next exam: 02/2020.  -+ numbness and tingling in her big toe but not in feet.  She has an ingrown toenail.  She also has COPD, depression, vit D def.: Lab Results  Component Value Date   VD25OH 28.42 (L) 07/24/2019   VD25OH 32.30 01/23/2019   VD25OH 18 (L) 04/29/2010  04/27/2017: 19.2  She will start seeing ortho for knee OA. She restarted vitamin D recently.  ROS: Constitutional: +  weight gain/no weight loss, no fatigue, no subjective hyperthermia, no subjective hypothermia Eyes: no blurry vision, no xerophthalmia ENT: no sore throat, no nodules palpated in neck, no dysphagia, no odynophagia, no hoarseness Cardiovascular: no CP/no SOB/no palpitations/+ leg swelling Respiratory: no cough/no SOB/no wheezing Gastrointestinal: no N/no V/no D/no C/no acid reflux Musculoskeletal: no muscle aches/+ joint aches Skin: no rashes, no hair loss Neurological: no tremors/+ numbness/+ tingling/no dizziness  I reviewed pt's medications, allergies, PMH, social hx, family hx, and  changes were documented in the history of present illness. Otherwise, unchanged from my initial visit note.  Past Medical History:  Diagnosis Date  . Asthma   . CHF (congestive heart failure) [facility])    [place] 11/2013[place]  . Chronic kidney disease   . Depression   . Diabetes mellitus ([place])   . GERD (gastroesophageal reflux disease)   . Hyperlipidemia   . Hypertension   . Stroke [facility])    Past Surgical History:  Procedure Laterality Date  . CHOLECYSTECTOMY  2002  . LOOP RECORDER INSERTION N/A 08/01/2017   Procedure: LOOP RECORDER INSERTION;  Surgeon: Constance Haw, MD;  Location: [place] [facility];  Service: Cardiovascular;  Laterality: N/A;  . SPINE SURGERY  aug 2015   [facility]  . TEE WITHOUT CARDIOVERSION N/A 08/01/2017   Procedure: TRANSESOPHAGEAL ECHOCARDIOGRAM (TEE);  Surgeon: Sanda Klein, MD;  Location: [facility] ENDOSCOPY;  Service: Cardiovascular;  Laterality: N/A;  . TONSILLECTOMY    . TUBAL LIGATION     Social History   Socioeconomic History  . Marital status: Widowed    Spouse name: Not on file  . Number of children: 4  . Years of education: Not on file  . Highest education level: Not on file  Occupational History  . Occupation: [employer]: [employer]  Tobacco Use  . Smoking status: Former Smoker    Packs/day: 1.50    Years: 20.00    Pack years: 30.00    Types: Cigarettes    Quit date: 08/02/1984    Years since quitting: 35.0  . Smokeless tobacco: Never Used  Substance and Sexual Activity  . Alcohol use: No  . Drug use: No  . Sexual activity: Not Currently    Partners: Male  Other Topics Concern  . Not on file  Social History Narrative   NO REG EXERCISE   Caffeine use: daily   Social Determinants of Health   Financial Resource Strain:   . Difficulty of Paying Living Expenses:   Food Insecurity:   . Worried About Charity fundraiser in the Last Year:   . Arboriculturist in the Last Year:    Transportation Needs:   . Film/video editor (Medical):   Marland Kitchen Lack of Transportation (Non-Medical):   Physical Activity:   . Days of Exercise per Week:   . Minutes of Exercise per Session:   Stress:   . Feeling of Stress :   Social Connections:   . Frequency of Communication with Friends and Family:   . Frequency of Social Gatherings with Friends and Family:   . Attends Religious Services:   . Active Member of Clubs or Organizations:   . Attends Archivist Meetings:   Marland Kitchen Marital Status:   Intimate Partner Violence:   . Fear of Current or Ex-Partner:   . Emotionally Abused:   Marland Kitchen Physically Abused:   . Sexually Abused:    Current Outpatient Medications on File Prior to  Visit  Medication Sig Dispense Refill  . Acetaminophen (TYLENOL 8 HOUR PO) Take 500 mg by mouth.    Marland Kitchen albuterol (PROVENTIL) (2.5 MG/3ML) 0.083% nebulizer solution Take 3 mLs (2.5 mg total) by nebulization every 6 (six) hours as needed for wheezing or shortness of breath. 75 mL 12  . albuterol (VENTOLIN HFA) 108 (90 Base) MCG/ACT inhaler Inhale 2 puffs into the lungs every 4 (four) hours as needed for wheezing or shortness of breath.     . allopurinol (ZYLOPRIM) 100 MG tablet TAKE 1 TABLET BY MOUTH EVERY DAY 90 tablet 1  . AMBULATORY NON FORMULARY MEDICATION Rollator 1 Units 0  . atorvastatin (LIPITOR) 40 MG tablet TAKE 1 TABLET BY MOUTH EVERYDAY AT BEDTIME 90 tablet 0  . busPIRone (BUSPAR) 15 MG tablet TAKE 1 TABLET BY MOUTH 3 TIMES DAILY. 270 tablet 1  . carvedilol (COREG) 6.25 MG tablet Take 1.5 tablets (9.375 mg total) by mouth 2 (two) times daily with a meal. 270 tablet 3  . clopidogrel (PLAVIX) 75 MG tablet TAKE 1 TABLET BY MOUTH EVERY DAY 90 tablet 1  . diclofenac sodium (VOLTAREN) 1 % GEL Apply 4 g topically 4 (four) times daily. 100 g 2  . EPINEPHrine 0.3 mg/0.3 mL IJ SOAJ injection PLEASE SEE ATTACHED FOR DETAILED DIRECTIONS    . ergocalciferol (VITAMIN D2) 1.25 MG (50000 UT) capsule Take 1 capsule  (50,000 Units total) by mouth once a week. Wednesday 12 capsule 1  . fenofibrate micronized (LOFIBRA) 200 MG capsule Take 1 capsule (200 mg total) by mouth daily. 90 capsule 1  . ferrous sulfate 325 (65 FE) MG tablet Take 325 mg by mouth.    . Fluticasone-Salmeterol (ADVAIR) 250-50 MCG/DOSE AEPB Inhale 1 puff into the lungs 2 (two) times daily.    Marland Kitchen gabapentin (NEURONTIN) 100 MG capsule Take 1 capsule (100 mg total) by mouth at bedtime. 180 capsule 0  . glucose blood test strip Use 3x a day - One TOUch Ultra 300 each 3  . Insulin Glargine (BASAGLAR KWIKPEN) 100 UNIT/ML SOPN INJECT 0.4 MLS (40 UNITS TOTAL) INTO THE SKIN AT BEDTIME. 15 pen 3  . Insulin Pen Needle (BD PEN NEEDLE NANO U/F) 32G X 4 MM MISC USE 4 TIMES DAILY 300 each 3  . pantoprazole (PROTONIX) 40 MG tablet TAKE 1 TABLET BY MOUTH EVERY DAY 90 tablet 1  . pramipexole (MIRAPEX) 1 MG tablet TAKE 1 TABLET BY MOUTH AT BEDTIME. 90 tablet 1  . sertraline (ZOLOFT) 50 MG tablet TAKE 1 AND 1/2 TABLETS DAILY BY MOUTH 135 tablet 1  . torsemide (DEMADEX) 20 MG tablet TAKE FOUR TABLETS BY MOUTH TWICE DAILY     No current facility-administered medications on file prior to visit.   Allergies  Allergen Reactions  . Bee Venom    Family History  Problem Relation Age of Onset  . Hypertension Mother   . Alzheimer's disease Mother   . Hyperlipidemia Mother   . Heart disease Father        cad  . Asthma Father   . Cancer Father 9       leukemia  . Stroke Father   . Hypertension Father   . Heart disease Sister 9       MI  . Pulmonary fibrosis Sister   . Arthritis Brother   . Heart disease Maternal Uncle   . Stroke Maternal Uncle   . Uterine cancer Paternal Grandmother   . Stomach cancer Paternal Grandfather   . Heart disease Maternal Uncle   .  Heart disease Maternal Uncle     PE: There were no vitals taken for this visit. There is no height or weight on file to calculate BMI. Wt Readings from Last 3 Encounters:  07/24/19 217 lb 6.4  oz (98.6 kg)  06/26/19 220 lb (99.8 kg)  06/14/19 203 lb (92.1 kg)   Constitutional:  in NAD  The physical exam was not performed (virtual visit).  ASSESSMENT: 1. DM2, insulin-dependent, now controlled, with complications - CKD  2. Obesity class 3 BMI Classification:  < 18.5 underweight   18.5-24.9 normal weight   25.0-29.9 overweight   30.0-34.9 class I obesity   35.0-39.9 class II obesity   ? 40.0 class III obesity   3. HL  PLAN:  1. Patient with longstanding, previously uncontrolled diabetes, now with better control after starting to be compliant with insulin and controlling her diet.  For 2 days a week she is staying with her son but whenever she is not living with her son and daughter-in-law, but for the rest of the time, she has a list of medicines and takes them based on the list prepared by her son. -At last visit, we continued only basal insulin and her sugars remained fairly well controlled.  She occasionally has sugars above target, but the majority of them are at goal.  There are no lows and no significant hyperglycemic spikes.  Therefore, for now, we we can continue the current regimen. - Most recent HbA1c from earlier this month was 5.9%, excellent - I advised her to:  Patient Instructions  Please continue: - Basaglar 16 units at bedtime  Please let me know if sugars before meals are consistently >140 and after meals >180.  Please return in 6 months with your sugar log.   - advised to check sugars at different times of the day - 1-2x a day, rotating check times - advised for yearly eye exams >> she is not UTD, but has an appointment coming up in 02/2020 - return to clinic in 6 months       2. Obesity class 3 -Since 2018, until our last in person visit in 04/2018, she lost almost 70 pounds.  At that time, she was weighing 188 pounds.  - - Since then, she gained almost 30 pounds.  3. HL -Reviewed latest lipid panel from 07/2019: All fractions at goal Lab  Results  Component Value Date   CHOL 122 07/24/2019   HDL 41.30 07/24/2019   LDLCALC 51 07/24/2019   LDLDIRECT 54.0 07/12/2017   TRIG 147.0 07/24/2019   CHOLHDL 3 07/24/2019  -Continue statin and fenofibrate without side effects  Philemon Kingdom, MD PhD [facility]

## 2019-08-02 NOTE — Patient Instructions (Signed)
Please continue: - Basaglar 16 units at bedtime  Please let me know if sugars before meals are consistently >140 and after meals >180.  Please return in 4 months with your sugar log.

## 2019-08-04 ENCOUNTER — Other Ambulatory Visit: Payer: Self-pay | Admitting: Family Medicine

## 2019-08-13 DIAGNOSIS — M4317 Spondylolisthesis, lumbosacral region: Secondary | ICD-10-CM | POA: Diagnosis not present

## 2019-08-20 ENCOUNTER — Ambulatory Visit (INDEPENDENT_AMBULATORY_CARE_PROVIDER_SITE_OTHER): Payer: Medicare Other | Admitting: *Deleted

## 2019-08-20 DIAGNOSIS — I639 Cerebral infarction, unspecified: Secondary | ICD-10-CM | POA: Diagnosis not present

## 2019-08-21 ENCOUNTER — Other Ambulatory Visit: Payer: Self-pay | Admitting: Family Medicine

## 2019-08-22 LAB — CUP PACEART REMOTE DEVICE CHECK
Date Time Interrogation Session: 20210519013406
Implantable Pulse Generator Implant Date: 20190429

## 2019-08-22 NOTE — Progress Notes (Signed)
Carelink Summary Report / Loop Recorder 

## 2019-09-03 ENCOUNTER — Other Ambulatory Visit: Payer: Self-pay | Admitting: Family Medicine

## 2019-09-03 DIAGNOSIS — K219 Gastro-esophageal reflux disease without esophagitis: Secondary | ICD-10-CM

## 2019-09-06 ENCOUNTER — Other Ambulatory Visit: Payer: Self-pay | Admitting: Family Medicine

## 2019-09-21 ENCOUNTER — Telehealth: Payer: Self-pay | Admitting: Family Medicine

## 2019-09-21 NOTE — Telephone Encounter (Signed)
Patient's daughter called to schedule an appointment with Dr [name] for the patient to receive knee injections. She also asked if Dr [name] would be able to order another epidural injection.  The patient did go see a Psychologist, sport and exercise but the family is currently undecided if they would like for her to proceed with surgery at this time.

## 2019-09-24 ENCOUNTER — Ambulatory Visit (INDEPENDENT_AMBULATORY_CARE_PROVIDER_SITE_OTHER): Payer: Medicare Other | Admitting: *Deleted

## 2019-09-24 ENCOUNTER — Other Ambulatory Visit: Payer: Self-pay

## 2019-09-24 DIAGNOSIS — I639 Cerebral infarction, unspecified: Secondary | ICD-10-CM | POA: Diagnosis not present

## 2019-09-24 DIAGNOSIS — M5416 Radiculopathy, lumbar region: Secondary | ICD-10-CM

## 2019-09-24 LAB — CUP PACEART REMOTE DEVICE CHECK
Date Time Interrogation Session: 20210621031154
Implantable Pulse Generator Implant Date: 20190429

## 2019-09-24 NOTE — Telephone Encounter (Signed)
Epidural ordered. Patient scheduled to see [place] tomorrow and will confirm order with her.

## 2019-09-24 NOTE — Telephone Encounter (Signed)
Yes repeat

## 2019-09-24 NOTE — Telephone Encounter (Signed)
Called patient to schedule

## 2019-09-25 ENCOUNTER — Encounter: Payer: Self-pay | Admitting: Family Medicine

## 2019-09-25 ENCOUNTER — Ambulatory Visit (INDEPENDENT_AMBULATORY_CARE_PROVIDER_SITE_OTHER): Payer: Medicare Other | Admitting: Family Medicine

## 2019-09-25 ENCOUNTER — Other Ambulatory Visit: Payer: Self-pay

## 2019-09-25 DIAGNOSIS — M170 Bilateral primary osteoarthritis of knee: Secondary | ICD-10-CM

## 2019-09-25 DIAGNOSIS — I639 Cerebral infarction, unspecified: Secondary | ICD-10-CM

## 2019-09-25 DIAGNOSIS — M549 Dorsalgia, unspecified: Secondary | ICD-10-CM

## 2019-09-25 NOTE — Progress Notes (Signed)
Carelink Summary Report / Loop Recorder 

## 2019-09-25 NOTE — Progress Notes (Signed)
[facility] [address] [place] Phone: [phone] Subjective:   I Erin [name] am serving as a Education administrator for Dr. Hulan Saas.  This visit occurred during the SARS-CoV-2 public health emergency.  Safety protocols were in place, including screening questions prior to the visit, additional usage of staff PPE, and extensive cleaning of exam room while observing appropriate contact time as indicated for disinfecting solutions.   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  We have attempted epidurals with very short-term relief.  Concern with patient's other comorbidities of adding too many medications at the moment.  Laboratory work-up has shown an anemia so will further evaluate for any iron deficiencies that could be contributing to some of the muscle aches and pains but unfortunately the adjacent segment disease is likely contributing to the decrease in quality of life and difficulty with activities of daily living.  I would like to refer patient to orthopedic back specialist for further evaluation for potential surgical intervention.  Patient as well as her daughter were in agreement with the plan but understand they are there to be evaluated.  No change otherwise in our treatment options at this time.  Spent greater than 35 minutes with patient today discussing patient's imaging, what we have done for treatment and treatment options at this time.  Update 09/25/2019 Erin [name] is a 74 y.o. female coming in with complaint of bilateral knee pain. Patient states she is in a lot of pain. Pain is worse at night. Has seen surgeon and daughter does not think it is a good option for her. Daughter would like another  epidural.  They were discussing with the surgeon and it would be a 2-day procedure.  They at this point would like to hold on this and consider some other potential treatment options.  Patient does have some baseline dementia.  Discussed most of this with daughter as well.  Past Medical History:  Diagnosis Date  . Asthma   . CHF (congestive heart failure) [facility])    [place] 11/2013[place]  . Chronic kidney disease   . Depression   . Diabetes mellitus ([place])   . GERD (gastroesophageal reflux disease)   . Hyperlipidemia   . Hypertension   . Stroke [facility])    Past Surgical History:  Procedure Laterality Date  . CHOLECYSTECTOMY  2002  . LOOP RECORDER INSERTION N/A 08/01/2017   Procedure: LOOP RECORDER INSERTION;  Surgeon: Constance Haw, MD;  Location: [place] [facility];  Service: Cardiovascular;  Laterality: N/A;  . SPINE SURGERY  aug 2015   [facility]  . TEE WITHOUT CARDIOVERSION N/A 08/01/2017   Procedure: TRANSESOPHAGEAL ECHOCARDIOGRAM (TEE);  Surgeon: Sanda Klein, MD;  Location: [facility] ENDOSCOPY;  Service: Cardiovascular;  Laterality: N/A;  . TONSILLECTOMY    . TUBAL LIGATION     Social History   Socioeconomic History  . Marital status: Widowed    Spouse name: Not on file  . Number of children: 4  . Years of education: Not on file  . Highest education level: Not on file  Occupational History  . Occupation: [employer] [place]: [employer]  Tobacco Use  . Smoking status: Former Smoker  Packs/day: 1.50    Years: 20.00    Pack years: 30.00    Types: Cigarettes    Quit date: 08/02/1984    Years since quitting: 35.1  . Smokeless tobacco: Never Used  Vaping Use  . Vaping Use: Never used  Substance and Sexual Activity  . Alcohol use: No  . Drug use: No  . Sexual activity: Not Currently    Partners: Male  Other Topics Concern  . Not on file  Social History Narrative   NO REG EXERCISE   Caffeine use: daily   Social Determinants of  Health   Financial Resource Strain:   . Difficulty of Paying Living Expenses:   Food Insecurity:   . Worried About Charity fundraiser in the Last Year:   . Arboriculturist in the Last Year:   Transportation Needs:   . Film/video editor (Medical):   Marland Kitchen Lack of Transportation (Non-Medical):   Physical Activity:   . Days of Exercise per Week:   . Minutes of Exercise per Session:   Stress:   . Feeling of Stress :   Social Connections:   . Frequency of Communication with Friends and Family:   . Frequency of Social Gatherings with Friends and Family:   . Attends Religious Services:   . Active Member of Clubs or Organizations:   . Attends Archivist Meetings:   Marland Kitchen Marital Status:    Allergies  Allergen Reactions  . Bee Venom    Family History  Problem Relation Age of Onset  . Hypertension Mother   . Alzheimer's disease Mother   . Hyperlipidemia Mother   . Heart disease Father        cad  . Asthma Father   . Cancer Father 56       leukemia  . Stroke Father   . Hypertension Father   . Heart disease Sister 83       MI  . Pulmonary fibrosis Sister   . Arthritis Brother   . Heart disease Maternal Uncle   . Stroke Maternal Uncle   . Uterine cancer Paternal Grandmother   . Stomach cancer Paternal Grandfather   . Heart disease Maternal Uncle   . Heart disease Maternal Uncle     Current Outpatient Medications (Endocrine & Metabolic):  Marland Kitchen  Insulin Glargine (BASAGLAR KWIKPEN) 100 UNIT/ML SOPN, INJECT 0.4 MLS (40 UNITS TOTAL) INTO THE SKIN AT BEDTIME.  Current Outpatient Medications (Cardiovascular):  .  atorvastatin (LIPITOR) 40 MG tablet, TAKE 1 TABLET BY MOUTH EVERYDAY AT BEDTIME .  carvedilol (COREG) 6.25 MG tablet, Take 1.5 tablets (9.375 mg total) by mouth 2 (two) times daily with a meal. .  EPINEPHrine 0.3 mg/0.3 mL IJ SOAJ injection, PLEASE SEE ATTACHED FOR DETAILED DIRECTIONS .  fenofibrate micronized (LOFIBRA) 200 MG capsule, TAKE 1 CAPSULE BY MOUTH EVERY  DAY .  torsemide (DEMADEX) 20 MG tablet, TAKE FOUR TABLETS BY MOUTH TWICE DAILY  Current Outpatient Medications (Respiratory):  .  albuterol (PROVENTIL) (2.5 MG/3ML) 0.083% nebulizer solution, Take 3 mLs (2.5 mg total) by nebulization every 6 (six) hours as needed for wheezing or shortness of breath. Marland Kitchen  albuterol (VENTOLIN HFA) 108 (90 Base) MCG/ACT inhaler, Inhale 2 puffs into the lungs every 4 (four) hours as needed for wheezing or shortness of breath.  .  Fluticasone-Salmeterol (ADVAIR) 250-50 MCG/DOSE AEPB, Inhale 1 puff into the lungs 2 (two) times daily.  Current Outpatient Medications (Analgesics):  Marland Kitchen  Acetaminophen (TYLENOL 8 HOUR PO), Take  500 mg by mouth. Marland Kitchen  allopurinol (ZYLOPRIM) 100 MG tablet, TAKE 1 TABLET BY MOUTH EVERY DAY  Current Outpatient Medications (Hematological):  .  clopidogrel (PLAVIX) 75 MG tablet, TAKE 1 TABLET BY MOUTH EVERY DAY .  ferrous sulfate 325 (65 FE) MG tablet, Take 325 mg by mouth.  Current Outpatient Medications (Other):  Marland Kitchen  AMBULATORY NON FORMULARY MEDICATION, Rollator .  busPIRone (BUSPAR) 15 MG tablet, TAKE 1 TABLET BY MOUTH 3 TIMES DAILY. Marland Kitchen  diclofenac sodium (VOLTAREN) 1 % GEL, Apply 4 g topically 4 (four) times daily. .  ergocalciferol (VITAMIN D2) 1.25 MG (50000 UT) capsule, Take 1 capsule (50,000 Units total) by mouth once a week. Wednesday .  gabapentin (NEURONTIN) 100 MG capsule, TAKE 1 CAPSULE BY MOUTH AT BEDTIME. Marland Kitchen  glucose blood test strip, Use 3x a day - One TOUch Ultra .  Insulin Pen Needle (BD PEN NEEDLE NANO U/F) 32G X 4 MM MISC, USE 4 TIMES DAILY .  pantoprazole (PROTONIX) 40 MG tablet, TAKE 1 TABLET BY MOUTH EVERY DAY .  pramipexole (MIRAPEX) 1 MG tablet, TAKE 1 TABLET BY MOUTH AT BEDTIME. Marland Kitchen  sertraline (ZOLOFT) 50 MG tablet, TAKE 1 AND 1/2 TABLETS DAILY BY MOUTH   Reviewed prior external information including notes and imaging from  primary care provider As well as notes that were available from care everywhere and other  healthcare systems.  Past medical history, social, surgical and family history all reviewed in electronic medical record.  No pertanent information unless stated regarding to the chief complaint.   Review of Systems:  No  visual changes, nausea, vomiting, diarrhea, constipation, dizziness, abdominal pain, skin rash, fevers, chills, night sweats, weight loss, swollen lymph nodes,  chest pain, shortness of breath, mood changes. POSITIVE muscle aches, body aches, joint swelling, headaches  Objective  Blood pressure (!) 150/62, pulse 78, height 5' 2.5" (1.588 m), weight 225 lb (102.1 kg), SpO2 96 %.   General: Uncomfortable, alert but not oriented HEENT: Pupils equal, extraocular movements intact  Respiratory: Patient's speak in full sentences and does not appear short of breath  Cardiovascular: 1+ lower extremity edema, non tender, no erythema  Neuro: Cranial nerves II through XII are intact, neurovascularly intact in all extremities with 2+ DTRs and 2+ pulses.  Gait antalgic uses the aid of a rolling walker MSK: Back exam diffuse tenderness to palpation even to light palpation.  Unable to do rest of exam secondary to voluntary guarding. Knee: Bilateral valgus deformity noted. Large thigh to calf ratio.  Tender to palpation over medial and PF joint line.  Range of motion lacks last 15 degrees of extension with crepitus noted in the last 15 degrees of flexion left and 10 degrees on the right. instability with valgus force.  painful patellar compression. Patellar glide with moderate crepitus. Patellar and quadriceps tendons unremarkable. Hamstring and quadriceps strength is normal.  After informed written and verbal consent, patient was seated on exam table. Right knee was prepped with alcohol swab and utilizing anterolateral approach, patient's right knee space was injected with 4:1  marcaine 0.5%: Kenalog 40mg /dL. Patient tolerated the procedure well without immediate complications.  After  informed written and verbal consent, patient was seated on exam table. Left knee was prepped with alcohol swab and utilizing anterolateral approach, patient's left knee space was injected with 4:1  marcaine 0.5%: Kenalog 40mg /dL. Patient tolerated the procedure well without immediate complications.    Impression and Recommendations:     The above documentation has been reviewed and is  accurate and complete Lyndal Pulley, DO       Note: This dictation was prepared with Dragon dictation along with smaller phrase technology. Any transcriptional errors that result from this process are unintentional.

## 2019-09-25 NOTE — Assessment & Plan Note (Signed)
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  Patient did discuss with neurosurgeon about the possibility of fixation of the adjacent segment disease but this would be a very large procedure.  They would like to hold on this at the moment.  Will repeat the epidural patient will then increase activity slowly.  Follow-up again in 4 to 8 weeks.

## 2019-09-25 NOTE — Assessment & Plan Note (Signed)
Chronic problem with worsening symptoms.  Discussed with patient again at great length.  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  Patient has responded to viscosupplementation previously.  We will consider the possibility of this as well.  Patient did have epidurals done.  Discussed which activities to potentially avoid.  Follow-up again 4 to 8 weeks.

## 2019-09-25 NOTE — Patient Instructions (Signed)
Good to see you Call [facility] to schedule epidural [phone] See me again in 6 weeks

## 2019-10-03 ENCOUNTER — Ambulatory Visit
Admission: RE | Admit: 2019-10-03 | Discharge: 2019-10-03 | Disposition: A | Discharge status: Home / Self Care | Payer: Medicare Other | Source: Ambulatory Visit | Attending: Family Medicine | Admitting: Family Medicine

## 2019-10-03 DIAGNOSIS — M5416 Radiculopathy, lumbar region: Secondary | ICD-10-CM

## 2019-10-03 MED ORDER — IOPAMIDOL (ISOVUE-M 200) INJECTION 41%
1.0000 mL | Freq: Once | INTRAMUSCULAR | Status: AC
  Administered 2019-10-03: 1 mL via EPIDURAL

## 2019-10-03 MED ORDER — METHYLPREDNISOLONE ACETATE 40 MG/ML INJ SUSP (RADIOLOG
120.0000 mg | Freq: Once | INTRAMUSCULAR | Status: AC
  Administered 2019-10-03: 120 mg via EPIDURAL

## 2019-10-03 NOTE — Discharge Instructions (Signed)
Spinal Injection Discharge Instruction Sheet ° °1. You may resume a regular diet and any medications that you routinely take, including pain medications. ° °2. No driving the rest of the day of the procedure. ° °3. Light activity throughout the rest of the day.  Do not do any strenuous work, exercise, bending or lifting.  The day following the procedure, you may resume normal physical activity but you should refrain from exercising or physical therapy for at least three days. ° ° °Common Side Effects: ° °· Headaches- take your usual medications as directed by your physician.   ° °· Restlessness or inability to sleep- you may have trouble sleeping for the next few days.  Ask your referring physician if you need any medication for sleep if over the counter sleep medications do not help. ° °· Facial flushing or redness- this should subside within a few days. ° °· Increased pain- a temporary increase in pain a day or two following your procedure is not unusual.  Take your pain medication as prescribed by your referring physician.  You may use ice to the injection site as needed.  Please do not use heat for 24 hours. ° °· Leg cramps ° °Please contact our office at [phone] for the following symptoms: °· Fever greater than 100 degrees. °· Headaches unresolved with medication after 2-3 days. °· Increased swelling, pain, or redness at injection site. ° °Thank you for visiting our office. ° ° °You may resume Plavix today. °

## 2019-10-09 ENCOUNTER — Encounter: Payer: Self-pay | Admitting: Family Medicine

## 2019-10-09 ENCOUNTER — Other Ambulatory Visit: Payer: Self-pay

## 2019-10-09 ENCOUNTER — Ambulatory Visit (INDEPENDENT_AMBULATORY_CARE_PROVIDER_SITE_OTHER): Payer: Medicare Other | Admitting: Family Medicine

## 2019-10-09 DIAGNOSIS — M170 Bilateral primary osteoarthritis of knee: Secondary | ICD-10-CM | POA: Diagnosis not present

## 2019-10-09 DIAGNOSIS — I639 Cerebral infarction, unspecified: Secondary | ICD-10-CM | POA: Diagnosis not present

## 2019-10-09 NOTE — Patient Instructions (Signed)
See me again in 2 months

## 2019-10-09 NOTE — Assessment & Plan Note (Signed)
Viscosupplementation given today.  Tolerated the procedure well.  Hoping the patient does have some good pain reduction.  Will not be completely pain-free.  Discussed this with family member at great length.  Chronic problem with continued exacerbation.  Patient social determinants of health including patient's baseline dementia which makes it somewhat difficult.  Also patient's other chronic problems making different medications such as anti-inflammatories if possible.  Follow-up again in 2 months

## 2019-10-09 NOTE — Progress Notes (Signed)
[place] [address] [place] Phone: [phone] Subjective:   I Erin [name] am serving as a Education administrator for Dr. Hulan Saas.  This visit occurred during the SARS-CoV-2 public health emergency.  Safety protocols were in place, including screening questions prior to the visit, additional usage of staff PPE, and extensive cleaning of exam room while observing appropriate contact time as indicated for disinfecting solutions.   I'm seeing this patient by the request  of:  Ann Held, DO  CC: Bilateral knee pain  [id number]   09/25/2019 Chronic problem with worsening symptoms.  Discussed with patient again at great length.  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  Patient has responded to viscosupplementation previously.  We will consider the possibility of this as well.  Patient did have epidurals done.  Discussed which activities to potentially avoid.  Follow-up again 4 to 8 weeks.  10/09/2019 Erin [name] is a 74 y.o. female coming in with complaint of bilateral knee pain. Patient states she would like injection.  Patient does have some baseline dementia.  Patient continues to have difficulty.  Patient is in accompanied with family member who states still complaining about the knees at the moment.  Did have some relief with the steroid injection but is looking for something more so she can have less pain with regular daily activities.    Past Medical History:  Diagnosis Date  . Asthma   . CHF (congestive heart failure) [facility])    hospital 11/2013-- [place]  . Chronic kidney disease   . Depression   . Diabetes mellitus ([place])   . GERD (gastroesophageal reflux disease)   . Hyperlipidemia   . Hypertension   . Stroke [facility])    Past Surgical History:  Procedure  Laterality Date  . CHOLECYSTECTOMY  2002  . LOOP RECORDER INSERTION N/A 08/01/2017   Procedure: LOOP RECORDER INSERTION;  Surgeon: Constance Haw, MD;  Location: [facility];  Service: Cardiovascular;  Laterality: N/A;  . SPINE SURGERY  aug 2015   [facility]  . TEE WITHOUT CARDIOVERSION N/A 08/01/2017   Procedure: TRANSESOPHAGEAL ECHOCARDIOGRAM (TEE);  Surgeon: Sanda Klein, MD;  Location: [facility] ENDOSCOPY;  Service: Cardiovascular;  Laterality: N/A;  . TONSILLECTOMY    . TUBAL LIGATION     Social History   Socioeconomic History  . Marital status: Widowed    Spouse name: Not on file  . Number of children: 4  . Years of education: Not on file  . Highest education level: Not on file  Occupational History  . Occupation: [employer] @ [place]: [employer]  Tobacco Use  . Smoking status: Former Smoker    Packs/day: 1.50    Years: 20.00    Pack years: 30.00    Types: Cigarettes    Quit date: 08/02/1984    Years since quitting: 35.2  . Smokeless tobacco: Never Used  Vaping Use  . Vaping Use: Never used  Substance and Sexual Activity  . Alcohol use: No  . Drug use: No  . Sexual activity: Not Currently    Partners: Male  Other Topics Concern  . Not on file  Social History Narrative   NO REG EXERCISE   Caffeine use: daily   Social Determinants of Health   Financial Resource Strain:   .  Difficulty of Paying Living Expenses:   Food Insecurity:   . Worried About Charity fundraiser in the Last Year:   . Arboriculturist in the Last Year:   Transportation Needs:   . Film/video editor (Medical):   Marland Kitchen Lack of Transportation (Non-Medical):   Physical Activity:   . Days of Exercise per Week:   . Minutes of Exercise per Session:   Stress:   . Feeling of Stress :   Social Connections:   . Frequency of Communication with Friends and Family:   . Frequency of Social Gatherings with Friends and Family:   . Attends Religious Services:   . Active  Member of Clubs or Organizations:   . Attends Archivist Meetings:   Marland Kitchen Marital Status:    Allergies  Allergen Reactions  . Bee Venom    Family History  Problem Relation Age of Onset  . Hypertension Mother   . Alzheimer's disease Mother   . Hyperlipidemia Mother   . Heart disease Father        cad  . Asthma Father   . Cancer Father 37       leukemia  . Stroke Father   . Hypertension Father   . Heart disease Sister 42       MI  . Pulmonary fibrosis Sister   . Arthritis Brother   . Heart disease Maternal Uncle   . Stroke Maternal Uncle   . Uterine cancer Paternal Grandmother   . Stomach cancer Paternal Grandfather   . Heart disease Maternal Uncle   . Heart disease Maternal Uncle     Current Outpatient Medications (Endocrine & Metabolic):  Marland Kitchen  Insulin Glargine (BASAGLAR KWIKPEN) 100 UNIT/ML SOPN, INJECT 0.4 MLS (40 UNITS TOTAL) INTO THE SKIN AT BEDTIME.  Current Outpatient Medications (Cardiovascular):  .  atorvastatin (LIPITOR) 40 MG tablet, TAKE 1 TABLET BY MOUTH EVERYDAY AT BEDTIME .  carvedilol (COREG) 6.25 MG tablet, Take 1.5 tablets (9.375 mg total) by mouth 2 (two) times daily with a meal. .  EPINEPHrine 0.3 mg/0.3 mL IJ SOAJ injection, PLEASE SEE ATTACHED FOR DETAILED DIRECTIONS .  fenofibrate micronized (LOFIBRA) 200 MG capsule, TAKE 1 CAPSULE BY MOUTH EVERY DAY .  torsemide (DEMADEX) 20 MG tablet, TAKE FOUR TABLETS BY MOUTH TWICE DAILY  Current Outpatient Medications (Respiratory):  .  albuterol (PROVENTIL) (2.5 MG/3ML) 0.083% nebulizer solution, Take 3 mLs (2.5 mg total) by nebulization every 6 (six) hours as needed for wheezing or shortness of breath. Marland Kitchen  albuterol (VENTOLIN HFA) 108 (90 Base) MCG/ACT inhaler, Inhale 2 puffs into the lungs every 4 (four) hours as needed for wheezing or shortness of breath.  .  Fluticasone-Salmeterol (ADVAIR) 250-50 MCG/DOSE AEPB, Inhale 1 puff into the lungs 2 (two) times daily.  Current Outpatient Medications  (Analgesics):  Marland Kitchen  Acetaminophen (TYLENOL 8 HOUR PO), Take 500 mg by mouth. Marland Kitchen  allopurinol (ZYLOPRIM) 100 MG tablet, TAKE 1 TABLET BY MOUTH EVERY DAY  Current Outpatient Medications (Hematological):  .  clopidogrel (PLAVIX) 75 MG tablet, TAKE 1 TABLET BY MOUTH EVERY DAY .  ferrous sulfate 325 (65 FE) MG tablet, Take 325 mg by mouth.  Current Outpatient Medications (Other):  Marland Kitchen  AMBULATORY NON FORMULARY MEDICATION, Rollator .  busPIRone (BUSPAR) 15 MG tablet, TAKE 1 TABLET BY MOUTH 3 TIMES DAILY. Marland Kitchen  diclofenac sodium (VOLTAREN) 1 % GEL, Apply 4 g topically 4 (four) times daily. .  ergocalciferol (VITAMIN D2) 1.25 MG (50000 UT) capsule, Take 1 capsule (  50,000 Units total) by mouth once a week. Wednesday .  gabapentin (NEURONTIN) 100 MG capsule, TAKE 1 CAPSULE BY MOUTH AT BEDTIME. Marland Kitchen  glucose blood test strip, Use 3x a day - One TOUch Ultra .  Insulin Pen Needle (BD PEN NEEDLE NANO U/F) 32G X 4 MM MISC, USE 4 TIMES DAILY .  pantoprazole (PROTONIX) 40 MG tablet, TAKE 1 TABLET BY MOUTH EVERY DAY .  pramipexole (MIRAPEX) 1 MG tablet, TAKE 1 TABLET BY MOUTH AT BEDTIME. Marland Kitchen  sertraline (ZOLOFT) 50 MG tablet, TAKE 1 AND 1/2 TABLETS DAILY BY MOUTH   Reviewed prior external information including notes and imaging from  primary care provider As well as notes that were available from care everywhere and other healthcare systems.  Past medical history, social, surgical and family history all reviewed in electronic medical record.  No pertanent information unless stated regarding to the chief complaint.   Review of Systems:  No headache, visual changes, nausea, vomiting, diarrhea, constipation, dizziness, abdominal pain, skin rash, fevers, chills, night sweats, weight loss, swollen lymph nodes,  chest pain, shortness of breath, mood changes. POSITIVE muscle aches, body aches, joint swelling  Objective  Blood pressure (!) 150/80, pulse 86, height 5' 2.5" (1.588 m), weight 221 lb (100.2 kg), SpO2 94 %.     General: No apparent distress patient is not oriented Gait ambulating with the aid of a walker MSK:   Knee: Bilateral valgus deformity noted. Large thigh to calf ratio.  Tender to palpation over medial and PF joint line.  Decreased range of motion in flexion and extension bilaterally with crepitus and pain. instability with valgus force.  painful patellar compression. Patellar glide with moderate crepitus. Patellar and quadriceps tendons unremarkable. Hamstring and quadriceps strength is normal.  After informed written and verbal consent, patient was seated on exam table. Right knee was prepped with alcohol swab and utilizing anterolateral approach, patient's right knee space was injected with 48 mg per 3 mL of Monovisc (sodium hyaluronate) in a prefilled syringe was injected easily into the knee through a 22-gauge needle..Patient tolerated the procedure well without immediate complications.  After informed written and verbal consent, patient was seated on exam table. Left knee was prepped with alcohol swab and utilizing anterolateral approach, patient's left knee space was injected with 40 mg per 3 mL of Monovisc (sodium hyaluronate) in a prefilled syringe was injected easily into the knee through a 22-gauge needle..Patient tolerated the procedure well without immediate complications.   Impression and Recommendations:     The above documentation has been reviewed and is accurate and complete Lyndal Pulley, DO       Note: This dictation was prepared with Dragon dictation along with smaller phrase technology. Any transcriptional errors that result from this process are unintentional.

## 2019-10-15 ENCOUNTER — Encounter: Payer: Self-pay | Admitting: General Practice

## 2019-10-17 ENCOUNTER — Other Ambulatory Visit: Payer: Self-pay | Admitting: Internal Medicine

## 2019-10-29 ENCOUNTER — Ambulatory Visit (INDEPENDENT_AMBULATORY_CARE_PROVIDER_SITE_OTHER): Payer: Medicare Other | Admitting: *Deleted

## 2019-10-29 DIAGNOSIS — I639 Cerebral infarction, unspecified: Secondary | ICD-10-CM

## 2019-10-29 LAB — CUP PACEART REMOTE DEVICE CHECK
Date Time Interrogation Session: 20210725232918
Implantable Pulse Generator Implant Date: 20190429

## 2019-10-31 ENCOUNTER — Other Ambulatory Visit: Payer: Self-pay

## 2019-10-31 ENCOUNTER — Encounter: Payer: Self-pay | Admitting: Family Medicine

## 2019-10-31 MED ORDER — AMBULATORY NON FORMULARY MEDICATION
1.0000 [IU] | Freq: Once | 0 refills | Status: AC
Start: 2019-10-31 — End: 2019-10-31

## 2019-11-01 NOTE — Progress Notes (Signed)
Carelink Summary Report / Loop Recorder 

## 2019-11-29 ENCOUNTER — Other Ambulatory Visit: Payer: Self-pay | Admitting: Family Medicine

## 2019-12-03 ENCOUNTER — Ambulatory Visit (INDEPENDENT_AMBULATORY_CARE_PROVIDER_SITE_OTHER): Payer: Medicare Other | Admitting: *Deleted

## 2019-12-03 DIAGNOSIS — I639 Cerebral infarction, unspecified: Secondary | ICD-10-CM | POA: Diagnosis not present

## 2019-12-04 LAB — CUP PACEART REMOTE DEVICE CHECK
Date Time Interrogation Session: 20210827233844
Implantable Pulse Generator Implant Date: 20190429

## 2019-12-05 NOTE — Progress Notes (Signed)
Carelink Summary Report / Loop Recorder 

## 2019-12-11 ENCOUNTER — Ambulatory Visit (INDEPENDENT_AMBULATORY_CARE_PROVIDER_SITE_OTHER): Payer: Medicare Other | Admitting: Family Medicine

## 2019-12-11 ENCOUNTER — Other Ambulatory Visit: Payer: Self-pay

## 2019-12-11 ENCOUNTER — Encounter: Payer: Self-pay | Admitting: Family Medicine

## 2019-12-11 DIAGNOSIS — I639 Cerebral infarction, unspecified: Secondary | ICD-10-CM | POA: Diagnosis not present

## 2019-12-11 DIAGNOSIS — I509 Heart failure, unspecified: Secondary | ICD-10-CM

## 2019-12-11 DIAGNOSIS — M170 Bilateral primary osteoarthritis of knee: Secondary | ICD-10-CM

## 2019-12-11 NOTE — Patient Instructions (Addendum)
Good to see you Injected the knees today  For the swelling of the legs check with PCP or heart Doctor See me again in 2 months

## 2019-12-11 NOTE — Assessment & Plan Note (Signed)
Patient level 5 caveat secondary to patient's difficulty with dementia.  Patient is with daughter at baseline and who is helping with this as well.  Patient has many different comorbidities and is not a surgical candidate.  Patient did not respond well to the viscosupplementation.  Steroids given again today and can repeat every 10 to 12 weeks.  Will monitor blood sugars a little closer over the course the next 48 hours.  No change in medications at this time follow-up again in 10 to 12 weeks

## 2019-12-11 NOTE — Assessment & Plan Note (Addendum)
History of congestive heart failure with acute renal failure previously.  Patient does have increasing lower extremity edema.  Encourage patient to follow-up with cardiologist or primary care doc to discuss any type of changes from torsemide to any other diuretics that could be contributing.  This will be very difficult in this individual.  We discussed if worsening shortness of breath to seek medical attention immediately

## 2019-12-11 NOTE — Progress Notes (Signed)
[place] [name] [address] [place] Phone: [phone] Subjective:   I [name] am serving as a Education administrator for Dr. Hulan Saas.  This visit occurred during the SARS-CoV-2 public health emergency.  Safety protocols were in place, including screening questions prior to the visit, additional usage of staff PPE, and extensive cleaning of exam room while observing appropriate contact time as indicated for disinfecting solutions.   I'm seeing this patient by the request  of:  Ann Held, DO  CC: Bilateral knee pain  [id number]   09/25/2019 Chronic problem with worsening symptoms.  Discussed with patient again at great length.  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  Patient has responded to viscosupplementation previously.  We will consider the possibility of this as well.  Patient did have epidurals done.  Discussed which activities to potentially avoid.  Follow-up again 4 to 8 weeks.  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  Patient did discuss with neurosurgeon about the possibility of fixation of the adjacent segment disease but this would be a very large procedure.  They would like to hold on this at the moment.  Will repeat the epidural patient will then increase activity slowly.  Follow-up again in 4 to 8 weeks.  Update 12/11/2019 Erin [name] is a 74 y.o. female coming in with complaint of back and bilateral knee pain. Monovisc approved on 09/26/2019. Patient states her legs and hips hurt today.  Patient did have actually the viscosupplementation done 2 months ago.  Patient does have some trouble with remembering and daughter is at bedside.  Daughter states maybe a little bit less complaints with the knees recently.  Now starting to have some instability again.   Also complaining of continued back pain.       Past Medical History:  Diagnosis Date  . Asthma   . CHF (congestive heart failure) [facility])    hospital 11/2013[place]  . Chronic kidney disease   . Depression   . Diabetes mellitus ([place])   . GERD (gastroesophageal reflux disease)   . Hyperlipidemia   . Hypertension   . Stroke [facility])    Past Surgical History:  Procedure Laterality Date  . CHOLECYSTECTOMY  2002  . LOOP RECORDER INSERTION N/A 08/01/2017   Procedure: LOOP RECORDER INSERTION;  Surgeon: Constance Haw, MD;  Location: [place] [facility];  Service: Cardiovascular;  Laterality: N/A;  . SPINE SURGERY  aug 2015   [facility]  . TEE WITHOUT CARDIOVERSION N/A 08/01/2017   Procedure: TRANSESOPHAGEAL ECHOCARDIOGRAM (TEE);  Surgeon: Sanda Klein, MD;  Location: [facility] ENDOSCOPY;  Service: Cardiovascular;  Laterality: N/A;  . TONSILLECTOMY    . TUBAL LIGATION     Social History   Socioeconomic History  . Marital status: Widowed    Spouse name: Not on file  . Number of children: 4  . Years of education: Not on file  . Highest education level: Not on file  Occupational History  . Occupation: [employer] @ [place]: [employer]  Tobacco Use  . Smoking status: Former Smoker    Packs/day: 1.50    Years: 20.00    Pack years: 30.00    Types: Cigarettes    Quit date: 08/02/1984    Years since quitting: 35.3  . Smokeless tobacco: Never Used  Vaping Use  .  Vaping Use: Never used  Substance and Sexual Activity  . Alcohol use: No  . Drug use: No  . Sexual activity: Not Currently    Partners: Male  Other Topics Concern  . Not on file  Social History Narrative   NO REG EXERCISE   Caffeine use: daily   Social Determinants of Health   Financial Resource Strain:   . Difficulty of Paying Living Expenses: Not on file  Food Insecurity:   . Worried About Charity fundraiser in the Last Year: Not on file  . Ran Out of Food in the Last Year: Not on  file  Transportation Needs:   . Lack of Transportation (Medical): Not on file  . Lack of Transportation (Non-Medical): Not on file  Physical Activity:   . Days of Exercise per Week: Not on file  . Minutes of Exercise per Session: Not on file  Stress:   . Feeling of Stress : Not on file  Social Connections:   . Frequency of Communication with Friends and Family: Not on file  . Frequency of Social Gatherings with Friends and Family: Not on file  . Attends Religious Services: Not on file  . Active Member of Clubs or Organizations: Not on file  . Attends Archivist Meetings: Not on file  . Marital Status: Not on file   Allergies  Allergen Reactions  . Bee Venom    Family History  Problem Relation Age of Onset  . Hypertension Mother   . Alzheimer's disease Mother   . Hyperlipidemia Mother   . Heart disease Father        cad  . Asthma Father   . Cancer Father 85       leukemia  . Stroke Father   . Hypertension Father   . Heart disease Sister 74       MI  . Pulmonary fibrosis Sister   . Arthritis Brother   . Heart disease Maternal Uncle   . Stroke Maternal Uncle   . Uterine cancer Paternal Grandmother   . Stomach cancer Paternal Grandfather   . Heart disease Maternal Uncle   . Heart disease Maternal Uncle     Current Outpatient Medications (Endocrine & Metabolic):  Marland Kitchen  Insulin Glargine (BASAGLAR KWIKPEN) 100 UNIT/ML, INJECT 0.4 MLS (40 UNITS TOTAL) INTO THE SKIN AT BEDTIME.  Current Outpatient Medications (Cardiovascular):  .  atorvastatin (LIPITOR) 40 MG tablet, TAKE 1 TABLET BY MOUTH EVERYDAY AT BEDTIME .  carvedilol (COREG) 6.25 MG tablet, Take 1.5 tablets (9.375 mg total) by mouth 2 (two) times daily with a meal. .  EPINEPHrine 0.3 mg/0.3 mL IJ SOAJ injection, PLEASE SEE ATTACHED FOR DETAILED DIRECTIONS .  fenofibrate micronized (LOFIBRA) 200 MG capsule, TAKE 1 CAPSULE BY MOUTH EVERY DAY .  torsemide (DEMADEX) 20 MG tablet, TAKE FOUR TABLETS BY MOUTH TWICE  DAILY  Current Outpatient Medications (Respiratory):  .  albuterol (PROVENTIL) (2.5 MG/3ML) 0.083% nebulizer solution, Take 3 mLs (2.5 mg total) by nebulization every 6 (six) hours as needed for wheezing or shortness of breath. Marland Kitchen  albuterol (VENTOLIN HFA) 108 (90 Base) MCG/ACT inhaler, Inhale 2 puffs into the lungs every 4 (four) hours as needed for wheezing or shortness of breath.  .  Fluticasone-Salmeterol (ADVAIR) 250-50 MCG/DOSE AEPB, Inhale 1 puff into the lungs 2 (two) times daily.  Current Outpatient Medications (Analgesics):  Marland Kitchen  Acetaminophen (TYLENOL 8 HOUR PO), Take 500 mg by mouth. Marland Kitchen  allopurinol (ZYLOPRIM) 100 MG tablet, TAKE 1 TABLET BY  MOUTH EVERY DAY  Current Outpatient Medications (Hematological):  .  clopidogrel (PLAVIX) 75 MG tablet, TAKE 1 TABLET BY MOUTH EVERY DAY .  ferrous sulfate 325 (65 FE) MG tablet, Take 325 mg by mouth.  Current Outpatient Medications (Other):  Marland Kitchen  AMBULATORY NON FORMULARY MEDICATION, Rollator .  busPIRone (BUSPAR) 15 MG tablet, TAKE 1 TABLET BY MOUTH 3 TIMES DAILY. Marland Kitchen  diclofenac sodium (VOLTAREN) 1 % GEL, Apply 4 g topically 4 (four) times daily. .  ergocalciferol (VITAMIN D2) 1.25 MG (50000 UT) capsule, Take 1 capsule (50,000 Units total) by mouth once a week. Wednesday .  gabapentin (NEURONTIN) 100 MG capsule, TAKE 1 CAPSULE BY MOUTH AT BEDTIME. Marland Kitchen  glucose blood test strip, Use 3x a day - One TOUch Ultra .  Insulin Pen Needle (BD PEN NEEDLE NANO U/F) 32G X 4 MM MISC, USE 4 TIMES DAILY .  pantoprazole (PROTONIX) 40 MG tablet, TAKE 1 TABLET BY MOUTH EVERY DAY .  pramipexole (MIRAPEX) 1 MG tablet, TAKE 1 TABLET BY MOUTH AT BEDTIME. Marland Kitchen  sertraline (ZOLOFT) 50 MG tablet, TAKE 1 AND 1/2 TABLETS DAILY BY MOUTH   Reviewed prior external information including notes and imaging from  primary care provider As well as notes that were available from care everywhere and other healthcare systems.  Past medical history, social, surgical and family history  all reviewed in electronic medical record.  No pertanent information unless stated regarding to the chief complaint.   Review of Systems:  No headache, visual changes, nausea, vomiting, diarrhea, constipation, dizziness, abdominal pain, skin rash, fevers, chills, night sweats, weight loss, swollen lymph nodes, mild increase in shortness of breath  Objective  Blood pressure (!) 150/78, pulse 80, height 5' 2.5" (1.588 m), weight 228 lb (103.4 kg), peak flow 94 L/min.   General: Patient is uncomfortable and alert but not oriented HEENT: Pupils equal, extraocular movements intact  Respiratory: Patient's speak in full sentences and does not appear short of breath  Cardiovascular: Significant 2+ pitting edema to the knees bilaterally with hemosiderin deposits bilaterally Gait severely antalgic walking with the aid of a walker MSK: Significant arthritic changes of multiple joints.  Tender to palpation in multiple different areas.  Patient knees though do have instability with valgus and varus force.  Abnormal thigh to calf ratio and difficult to assess secondary to patient's body habitus.  Limited range of motion secondary to involuntary involuntary guarding with moderate crepitus  After informed written and verbal consent, patient was seated on exam table. Right knee was prepped with alcohol swab and utilizing anterolateral approach, patient's right knee space was injected with 4:1  marcaine 0.5%: Kenalog 40mg /dL. Patient tolerated the procedure well without immediate complications.  After informed written and verbal consent, patient was seated on exam table. Left knee was prepped with alcohol swab and utilizing anterolateral approach, patient's left knee space was injected with 4:1  marcaine 0.5%: Kenalog 40mg /dL. Patient tolerated the procedure well without immediate complications.    Impression and Recommendations:     The above documentation has been reviewed and is accurate and complete Lyndal Pulley, DO       Note: This dictation was prepared with Dragon dictation along with smaller phrase technology. Any transcriptional errors that result from this process are unintentional.

## 2019-12-19 ENCOUNTER — Other Ambulatory Visit: Payer: Self-pay | Admitting: Family Medicine

## 2019-12-19 DIAGNOSIS — F32A Depression, unspecified: Secondary | ICD-10-CM

## 2020-01-07 ENCOUNTER — Ambulatory Visit (INDEPENDENT_AMBULATORY_CARE_PROVIDER_SITE_OTHER): Payer: Medicare Other

## 2020-01-07 DIAGNOSIS — I639 Cerebral infarction, unspecified: Secondary | ICD-10-CM

## 2020-01-07 LAB — CUP PACEART REMOTE DEVICE CHECK
Date Time Interrogation Session: 20210929233649
Implantable Pulse Generator Implant Date: 20190429

## 2020-01-08 DIAGNOSIS — E559 Vitamin D deficiency, unspecified: Secondary | ICD-10-CM | POA: Diagnosis not present

## 2020-01-08 DIAGNOSIS — E1122 Type 2 diabetes mellitus with diabetic chronic kidney disease: Secondary | ICD-10-CM | POA: Diagnosis not present

## 2020-01-08 DIAGNOSIS — I129 Hypertensive chronic kidney disease with stage 1 through stage 4 chronic kidney disease, or unspecified chronic kidney disease: Secondary | ICD-10-CM | POA: Diagnosis not present

## 2020-01-08 DIAGNOSIS — N3941 Urge incontinence: Secondary | ICD-10-CM | POA: Diagnosis not present

## 2020-01-08 DIAGNOSIS — M9080 Osteopathy in diseases classified elsewhere, unspecified site: Secondary | ICD-10-CM | POA: Diagnosis not present

## 2020-01-08 DIAGNOSIS — N183 Chronic kidney disease, stage 3 unspecified: Secondary | ICD-10-CM | POA: Diagnosis not present

## 2020-01-08 DIAGNOSIS — E889 Metabolic disorder, unspecified: Secondary | ICD-10-CM | POA: Diagnosis not present

## 2020-01-08 DIAGNOSIS — E8779 Other fluid overload: Secondary | ICD-10-CM | POA: Diagnosis not present

## 2020-01-08 NOTE — Progress Notes (Signed)
Carelink Summary Report / Loop Recorder 

## 2020-01-12 ENCOUNTER — Other Ambulatory Visit: Payer: Self-pay | Admitting: Internal Medicine

## 2020-01-16 ENCOUNTER — Other Ambulatory Visit: Payer: Self-pay | Admitting: Family Medicine

## 2020-01-20 ENCOUNTER — Other Ambulatory Visit: Payer: Self-pay | Admitting: Family Medicine

## 2020-02-08 LAB — CUP PACEART REMOTE DEVICE CHECK
Date Time Interrogation Session: 20211101233720
Implantable Pulse Generator Implant Date: 20190429

## 2020-02-11 ENCOUNTER — Ambulatory Visit (INDEPENDENT_AMBULATORY_CARE_PROVIDER_SITE_OTHER): Payer: Medicare Other

## 2020-02-11 DIAGNOSIS — I639 Cerebral infarction, unspecified: Secondary | ICD-10-CM

## 2020-02-11 NOTE — Progress Notes (Signed)
Carelink Summary Report / Loop Recorder 

## 2020-02-12 ENCOUNTER — Other Ambulatory Visit: Payer: Self-pay | Admitting: Family Medicine

## 2020-02-12 ENCOUNTER — Ambulatory Visit: Payer: Medicare Other | Admitting: Family Medicine

## 2020-02-12 DIAGNOSIS — I129 Hypertensive chronic kidney disease with stage 1 through stage 4 chronic kidney disease, or unspecified chronic kidney disease: Secondary | ICD-10-CM | POA: Diagnosis not present

## 2020-02-12 DIAGNOSIS — N183 Chronic kidney disease, stage 3 unspecified: Secondary | ICD-10-CM | POA: Diagnosis not present

## 2020-02-15 ENCOUNTER — Other Ambulatory Visit: Payer: Self-pay | Admitting: Family Medicine

## 2020-02-15 DIAGNOSIS — K219 Gastro-esophageal reflux disease without esophagitis: Secondary | ICD-10-CM

## 2020-03-05 ENCOUNTER — Ambulatory Visit (INDEPENDENT_AMBULATORY_CARE_PROVIDER_SITE_OTHER): Payer: Medicare Other

## 2020-03-05 ENCOUNTER — Encounter: Payer: Self-pay | Admitting: Family Medicine

## 2020-03-05 ENCOUNTER — Other Ambulatory Visit: Payer: Medicare Other

## 2020-03-05 ENCOUNTER — Ambulatory Visit (INDEPENDENT_AMBULATORY_CARE_PROVIDER_SITE_OTHER): Payer: Medicare Other | Admitting: Family Medicine

## 2020-03-05 ENCOUNTER — Other Ambulatory Visit: Payer: Self-pay

## 2020-03-05 DIAGNOSIS — E559 Vitamin D deficiency, unspecified: Secondary | ICD-10-CM

## 2020-03-05 DIAGNOSIS — E1110 Type 2 diabetes mellitus with ketoacidosis without coma: Secondary | ICD-10-CM | POA: Diagnosis not present

## 2020-03-05 DIAGNOSIS — M2550 Pain in unspecified joint: Secondary | ICD-10-CM

## 2020-03-05 DIAGNOSIS — I517 Cardiomegaly: Secondary | ICD-10-CM | POA: Diagnosis not present

## 2020-03-05 DIAGNOSIS — R0602 Shortness of breath: Secondary | ICD-10-CM | POA: Diagnosis not present

## 2020-03-05 DIAGNOSIS — I639 Cerebral infarction, unspecified: Secondary | ICD-10-CM | POA: Diagnosis not present

## 2020-03-05 DIAGNOSIS — D729 Disorder of white blood cells, unspecified: Secondary | ICD-10-CM

## 2020-03-05 DIAGNOSIS — M170 Bilateral primary osteoarthritis of knee: Secondary | ICD-10-CM | POA: Diagnosis not present

## 2020-03-05 LAB — CBC WITH DIFFERENTIAL/PLATELET
Basophils Absolute: 0.1 10*3/uL (ref 0.0–0.1)
Basophils Relative: 1 % (ref 0.0–3.0)
Eosinophils Absolute: 0.1 10*3/uL (ref 0.0–0.7)
Eosinophils Relative: 0.8 % (ref 0.0–5.0)
HCT: 30.6 % — ABNORMAL LOW (ref 36.0–46.0)
Hemoglobin: 10 g/dL — ABNORMAL LOW (ref 12.0–15.0)
Lymphocytes Relative: 9.2 % — ABNORMAL LOW (ref 12.0–46.0)
Lymphs Abs: 0.7 10*3/uL (ref 0.7–4.0)
MCHC: 32.7 g/dL (ref 30.0–36.0)
MCV: 87.9 fl (ref 78.0–100.0)
Monocytes Absolute: 0.6 10*3/uL (ref 0.1–1.0)
Monocytes Relative: 8.1 % (ref 3.0–12.0)
Neutro Abs: 5.7 10*3/uL (ref 1.4–7.7)
Neutrophils Relative %: 80.9 % — ABNORMAL HIGH (ref 43.0–77.0)
Platelets: 94 10*3/uL — ABNORMAL LOW (ref 150.0–400.0)
RBC: 3.48 Mil/uL — ABNORMAL LOW (ref 3.87–5.11)
RDW: 17.4 % — ABNORMAL HIGH (ref 11.5–15.5)
WBC: 7.1 10*3/uL (ref 4.0–10.5)

## 2020-03-05 LAB — COMPREHENSIVE METABOLIC PANEL
ALT: 20 U/L (ref 0–35)
AST: 28 U/L (ref 0–37)
Albumin: 4.3 g/dL (ref 3.5–5.2)
Alkaline Phosphatase: 53 U/L (ref 39–117)
BUN: 72 mg/dL — ABNORMAL HIGH (ref 6–23)
CO2: 29 mEq/L (ref 19–32)
Calcium: 9.6 mg/dL (ref 8.4–10.5)
Chloride: 100 mEq/L (ref 96–112)
Creatinine, Ser: 1.62 mg/dL — ABNORMAL HIGH (ref 0.40–1.20)
GFR: 31.03 mL/min — ABNORMAL LOW (ref >60.00)
Glucose, Bld: 133 mg/dL — ABNORMAL HIGH (ref 70–99)
Potassium: 4.2 mEq/L (ref 3.5–5.1)
Sodium: 137 mEq/L (ref 135–145)
Total Bilirubin: 0.7 mg/dL (ref 0.2–1.2)
Total Protein: 7.3 g/dL (ref 6.0–8.3)

## 2020-03-05 LAB — IBC PANEL
Iron: 55 ug/dL (ref 42–145)
Saturation Ratios: 11.6 % — ABNORMAL LOW (ref 20.0–50.0)
Transferrin: 338 mg/dL (ref 212.0–360.0)

## 2020-03-05 LAB — BRAIN NATRIURETIC PEPTIDE: Pro B Natriuretic peptide (BNP): 44 pg/mL (ref 0.0–100.0)

## 2020-03-05 LAB — VITAMIN D 25 HYDROXY (VIT D DEFICIENCY, FRACTURES): VITD: 46.28 ng/mL (ref 30.00–100.00)

## 2020-03-05 LAB — SEDIMENTATION RATE: Sed Rate: 47 mm/hr — ABNORMAL HIGH (ref 0–30)

## 2020-03-05 LAB — HEMOGLOBIN A1C: Hgb A1c MFr Bld: 6.7 % — ABNORMAL HIGH (ref 4.6–6.5)

## 2020-03-05 LAB — URIC ACID: Uric Acid, Serum: 7.9 mg/dL — ABNORMAL HIGH (ref 2.4–7.0)

## 2020-03-05 NOTE — Progress Notes (Signed)
[place] Phone: [phone] Subjective:   [name], am serving as a scribe for Dr. Hulan Saas. \This visit occurred during the SARS-CoV-2 public health emergency.  Safety protocols were in place, including screening questions prior to the visit, additional usage of staff PPE, and extensive cleaning of exam room while observing appropriate contact time as indicated for disinfecting solutions.   I'm seeing this patient by the request  of:  Ann Held, DO  CC: Bilateral knee pain follow-up  YPP:[id number]   12/11/2019 Patient level 5 caveat secondary to patient's difficulty with dementia.  Patient is with daughter at baseline and who is helping with this as well.  Patient has many different comorbidities and is not a surgical candidate.  Patient did not respond well to the viscosupplementation.  Steroids given again today and can repeat every 10 to 12 weeks.  Will monitor blood sugars a little closer over the course the next 48 hours.  No change in medications at this time follow-up again in 10 to 12 weeks  History of congestive heart failure with acute renal failure previously.  Patient does have increasing lower extremity edema.  Encourage patient to follow-up with cardiologist or primary care doc to discuss any type of changes from torsemide to any other diuretics that could be contributing.  This will be very difficult in this individual.  We discussed if worsening shortness of breath to seek medical attention immediately  Update 03/05/2020 Erin [name] is a 74 y.o. female coming in with complaint of bilateral knee pain. Patient states  that her pain has increased since last visit.  Patient is accompanied with her grandson.  Patient states that he has noticed that he is she has been a little more hopeful recently.  Patient does appear to be short of breath.  He does have significant dementia at  baseline  Past Medical History:  Diagnosis Date  . Asthma   . CHF (congestive heart failure) [facility])    hospital 11/2013-- [place]  . Chronic kidney disease   . Depression   . Diabetes mellitus ([place])   . GERD (gastroesophageal reflux disease)   . Hyperlipidemia   . Hypertension   . Stroke [facility])    Past Surgical History:  Procedure Laterality Date  . CHOLECYSTECTOMY  2002  . LOOP RECORDER INSERTION N/A 08/01/2017   Procedure: LOOP RECORDER INSERTION;  Surgeon: Constance Haw, MD;  Location: [place] [facility];  Service: Cardiovascular;  Laterality: N/A;  . SPINE SURGERY  aug 2015   [facility]  . TEE WITHOUT CARDIOVERSION N/A 08/01/2017   Procedure: TRANSESOPHAGEAL ECHOCARDIOGRAM (TEE);  Surgeon: Sanda Klein, MD;  Location: [facility] ENDOSCOPY;  Service: Cardiovascular;  Laterality: N/A;  . TONSILLECTOMY    . TUBAL LIGATION     Social History   Socioeconomic History  . Marital status: Widowed    Spouse name: Not on file  . Number of children: 4  . Years of education: Not on file  . Highest education level: Not on file  Occupational History  . Occupation: [employer] @ [place]: [employer]  Tobacco Use  . Smoking status: Former Smoker    Packs/day: 1.50    Years: 20.00    Pack years: 30.00    Types: Cigarettes    Quit date: 08/02/1984    Years since quitting: 35.6  . Smokeless tobacco: Never Used  Vaping Use  .  Vaping Use: Never used  Substance and Sexual Activity  . Alcohol use: No  . Drug use: No  . Sexual activity: Not Currently    Partners: Male  Other Topics Concern  . Not on file  Social History Narrative   NO REG EXERCISE   Caffeine use: daily   Social Determinants of Health   Financial Resource Strain:   . Difficulty of Paying Living Expenses: Not on file  Food Insecurity:   . Worried About Charity fundraiser in the Last Year: Not on file  . Ran Out of Food in the Last Year: Not on file  Transportation Needs:   . Lack of  Transportation (Medical): Not on file  . Lack of Transportation (Non-Medical): Not on file  Physical Activity:   . Days of Exercise per Week: Not on file  . Minutes of Exercise per Session: Not on file  Stress:   . Feeling of Stress : Not on file  Social Connections:   . Frequency of Communication with Friends and Family: Not on file  . Frequency of Social Gatherings with Friends and Family: Not on file  . Attends Religious Services: Not on file  . Active Member of Clubs or Organizations: Not on file  . Attends Archivist Meetings: Not on file  . Marital Status: Not on file   Allergies  Allergen Reactions  . Bee Venom    Family History  Problem Relation Age of Onset  . Hypertension Mother   . Alzheimer's disease Mother   . Hyperlipidemia Mother   . Heart disease Father        cad  . Asthma Father   . Cancer Father 27       leukemia  . Stroke Father   . Hypertension Father   . Heart disease Sister 3       MI  . Pulmonary fibrosis Sister   . Arthritis Brother   . Heart disease Maternal Uncle   . Stroke Maternal Uncle   . Uterine cancer Paternal Grandmother   . Stomach cancer Paternal Grandfather   . Heart disease Maternal Uncle   . Heart disease Maternal Uncle     Current Outpatient Medications (Endocrine & Metabolic):  Marland Kitchen  Insulin Glargine (BASAGLAR KWIKPEN) 100 UNIT/ML, INJECT 0.4 MLS (40 UNITS TOTAL) INTO THE SKIN AT BEDTIME.  Current Outpatient Medications (Cardiovascular):  .  atorvastatin (LIPITOR) 40 MG tablet, TAKE 1 TABLET BY MOUTH EVERYDAY AT BEDTIME .  carvedilol (COREG) 6.25 MG tablet, Take 1.5 tablets (9.375 mg total) by mouth 2 (two) times daily with a meal. .  EPINEPHrine 0.3 mg/0.3 mL IJ SOAJ injection, PLEASE SEE ATTACHED FOR DETAILED DIRECTIONS .  fenofibrate micronized (LOFIBRA) 200 MG capsule, TAKE 1 CAPSULE BY MOUTH EVERY DAY .  torsemide (DEMADEX) 20 MG tablet, TAKE FOUR TABLETS BY MOUTH TWICE DAILY  Current Outpatient Medications  (Respiratory):  .  albuterol (PROVENTIL) (2.5 MG/3ML) 0.083% nebulizer solution, Take 3 mLs (2.5 mg total) by nebulization every 6 (six) hours as needed for wheezing or shortness of breath. Marland Kitchen  albuterol (VENTOLIN HFA) 108 (90 Base) MCG/ACT inhaler, Inhale 2 puffs into the lungs every 4 (four) hours as needed for wheezing or shortness of breath.  .  Fluticasone-Salmeterol (ADVAIR) 250-50 MCG/DOSE AEPB, Inhale 1 puff into the lungs 2 (two) times daily.  Current Outpatient Medications (Analgesics):  Marland Kitchen  Acetaminophen (TYLENOL 8 HOUR PO), Take 500 mg by mouth. Marland Kitchen  allopurinol (ZYLOPRIM) 100 MG tablet, TAKE 1 TABLET BY  MOUTH EVERY DAY  Current Outpatient Medications (Hematological):  .  clopidogrel (PLAVIX) 75 MG tablet, TAKE 1 TABLET BY MOUTH EVERY DAY .  ferrous sulfate 325 (65 FE) MG tablet, Take 325 mg by mouth.  Current Outpatient Medications (Other):  Marland Kitchen  AMBULATORY NON FORMULARY MEDICATION, Rollator .  BD PEN NEEDLE NANO 2ND GEN 32G X 4 MM MISC, USE 4 TIMES A DAY .  busPIRone (BUSPAR) 15 MG tablet, TAKE 1 TABLET BY MOUTH 3 TIMES DAILY. Marland Kitchen  diclofenac sodium (VOLTAREN) 1 % GEL, Apply 4 g topically 4 (four) times daily. .  ergocalciferol (VITAMIN D2) 1.25 MG (50000 UT) capsule, Take 1 capsule (50,000 Units total) by mouth once a week. Wednesday .  gabapentin (NEURONTIN) 100 MG capsule, TAKE 1 CAPSULE BY MOUTH AT BEDTIME. Marland Kitchen  glucose blood test strip, Use 3x a day - One TOUch Ultra .  pantoprazole (PROTONIX) 40 MG tablet, TAKE 1 TABLET BY MOUTH EVERY DAY .  pramipexole (MIRAPEX) 1 MG tablet, TAKE 1 TABLET BY MOUTH AT BEDTIME. Marland Kitchen  sertraline (ZOLOFT) 50 MG tablet, TAKE 1 AND 1/2 TABLETS DAILY BY MOUTH   Reviewed prior external information including notes and imaging from  primary care provider As well as notes that were available from care everywhere and other healthcare systems.  Past medical history, social, surgical and family history all reviewed in electronic medical record.  No pertanent  information unless stated regarding to the chief complaint.   Review of Systems:  No headache, visual changes, nausea, vomiting, diarrhea, constipation, dizziness, abdominal pain, skin rash, fevers, chills, night sweats, weight loss, swollen lymph nodes, , chest pain, shortness of breath, mood changes. POSITIVE muscle aches, body aches, joint swelling, family states a little more fatigued than usual  Objective  Blood pressure 132/60, pulse 74, height 5' 2.5" (1.588 m), weight 226 lb (102.5 kg), SpO2 98 %.   General: No apparent distress, alert but not oriented HEENT: Pupils equal, extraocular movements intact  Respiratory: Patient's speak in full sentences and but does appear to be somewhat short of breath at baseline Cardiovascular: 3+ lower extremity edema pitting edema in hemosiderin deposits, mild tender   Gait ambulates with the aid of a walker MSK: Bilateral knees have significant abnormal thigh to calf ratio.  Patient does have instability with valgus and varus force.  Significant decrease in range of motion and does have audible crepitus noted severely bilaterally.  After informed written and verbal consent, patient was seated on exam table. Right knee was prepped with alcohol swab and utilizing anterolateral approach, patient's right knee space was injected with 4:1  marcaine 0.5%: Kenalog 40mg /dL. Patient tolerated the procedure well without immediate complications.  After informed written and verbal consent, patient was seated on exam table. Left knee was prepped with alcohol swab and utilizing anterolateral approach, patient's left knee space was injected with 4:1  marcaine 0.5%: Kenalog 40mg /dL. Patient tolerated the procedure well without immediate complications.    Impression and Recommendations:     The above documentation has been reviewed and is accurate and complete Lyndal Pulley, DO

## 2020-03-05 NOTE — Assessment & Plan Note (Signed)
Bilateral arthritic changes of the knees.  Repeated injections today to give her some relief.  Secondary to patient's comorbidities patient is not a candidate for surgical intervention.  Has significant dementia at baseline.  Patient is accompanied with grandson today.  We discussed laboratory work-up secondary to patient's history of congestive heart failure and worsening swelling of the lower extremities.  Patient knows if any worsening symptoms to seek medical attention immediately and go to the emergency room.  Patient is in agreement with the plan discussed with grandson.  Patient grandson states that he lives behind her has noticed more increasing shortness of breath minorly.  Patient will watch closely.

## 2020-03-05 NOTE — Patient Instructions (Addendum)
Happy Holidays Injected knees today See me again in 12 weeks

## 2020-03-06 ENCOUNTER — Other Ambulatory Visit: Payer: Self-pay

## 2020-03-06 DIAGNOSIS — R799 Abnormal finding of blood chemistry, unspecified: Secondary | ICD-10-CM

## 2020-03-06 LAB — PATHOLOGIST SMEAR REVIEW

## 2020-03-07 ENCOUNTER — Encounter: Payer: Self-pay | Admitting: Family Medicine

## 2020-03-08 ENCOUNTER — Other Ambulatory Visit: Payer: Self-pay | Admitting: Family Medicine

## 2020-03-11 ENCOUNTER — Other Ambulatory Visit: Payer: Self-pay | Admitting: Family Medicine

## 2020-03-11 DIAGNOSIS — K219 Gastro-esophageal reflux disease without esophagitis: Secondary | ICD-10-CM

## 2020-03-16 LAB — CUP PACEART REMOTE DEVICE CHECK
Date Time Interrogation Session: 20211204224226
Implantable Pulse Generator Implant Date: 20190429

## 2020-03-17 ENCOUNTER — Ambulatory Visit (INDEPENDENT_AMBULATORY_CARE_PROVIDER_SITE_OTHER): Payer: Medicare Other

## 2020-03-17 ENCOUNTER — Other Ambulatory Visit: Payer: Self-pay

## 2020-03-17 DIAGNOSIS — I639 Cerebral infarction, unspecified: Secondary | ICD-10-CM | POA: Diagnosis not present

## 2020-03-17 DIAGNOSIS — Z807 Family history of other malignant neoplasms of lymphoid, hematopoietic and related tissues: Secondary | ICD-10-CM

## 2020-03-17 NOTE — Telephone Encounter (Signed)
Patient's daughter asked if her referral could be sent over to Dr. Hosie Poisson - [address] in [place].  She has not seen her in a couple years so they will need a referral for her to be seen.

## 2020-03-25 ENCOUNTER — Other Ambulatory Visit: Payer: Self-pay | Admitting: Family Medicine

## 2020-03-28 ENCOUNTER — Telehealth: Payer: Self-pay | Admitting: Oncology

## 2020-03-28 NOTE — Telephone Encounter (Signed)
Patient referred by Dr Hulan Saas for Abnormal Blood Work.  Patient is currently est for Thrombocytopenia.  Last seen 02/07/2017  Appt made for 04/03/2020 Labs 9:30 am - Consult (30 mins) 10:00 am.  Please let me know if you need an hour.  I will reschedule if needed

## 2020-03-31 DIAGNOSIS — Z23 Encounter for immunization: Secondary | ICD-10-CM | POA: Diagnosis not present

## 2020-03-31 NOTE — Progress Notes (Signed)
Carelink Summary Report / Loop Recorder 

## 2020-04-01 ENCOUNTER — Other Ambulatory Visit: Payer: Self-pay | Admitting: Oncology

## 2020-04-01 DIAGNOSIS — D649 Anemia, unspecified: Secondary | ICD-10-CM

## 2020-04-01 DIAGNOSIS — D696 Thrombocytopenia, unspecified: Secondary | ICD-10-CM

## 2020-04-02 NOTE — Progress Notes (Signed)
[facility]  [address] [place],  [place]  [address] [phone]  Clinic Day:  04/03/2020  Referring physician: Lyndal Pulley, DO   This document serves as a record of services personally performed by Hosie Poisson, MD. It was created on their behalf by Curry,Lauren E, a trained medical scribe. The creation of this record is based on the scribe's personal observations and the provider's statements to them.   CHIEF COMPLAINT:  CC:  Myelofibrosis  Current Treatment:  Surveillance   HISTORY OF PRESENT ILLNESS:  Erin [name] is a 74 y.o. female who I saw many years ago for thrombocytosis and she was referred back to me in July of 2018 for thrombocytopenia.  She has also been found to have anemia and both her hemoglobin and platelet count have been decreasing.  Her hemoglobin was down to 9.8 in July 2018 with a platelet count of 74,000. Her white count was elevated at 13,000 with 81% neutrophils, 8% lymphocytes, 9% monocytes, 1% eosinophils, and 1% basophils.  However, when I rechecked these counts on July 26th, I found more of a left shift and her white count was up to 16.6.  Her hemoglobin was a little better at 11.2 with an MCV of 84 and platelet count of 83,000. This time her differential revealed 61% neutrophils, 16% bands, 6% lymphocytes, 3% monocytes, 1% basophils, 5% metamyelocytes, 6% myelocytes and 2% blasts.  A reticulocyte count was 5.4%, sed rate was 93 and LDH was markedly elevated at 1939.  No monoclonal spike was seen.  I reviewed her peripheral blood smear myself and did confirm a rare blast and promyelocyte.  She was understandably anxious since her father had some form of leukemia at age 88 and lived 5 years.  She thinks this may have been chronic myelogenous leukemia.  An aunt also had leukemia.  She does note dyspnea with exertion.  She does see a nephrologist in [facility] with [facility] for chronic kidney disease, Toye Lufadeju,  MD.  She also has venous stasis and has wraps on her legs.  She complains of pain of her lower back and legs at a 6/10.  When I saw her in August 2018, her white count was still elevated at 14.5 with 45% neutrophils, 19% bands, 9% lymphocytes, 1% monocytes, 2% eosinophils, 2% basophils, 8% metamyelocytes, 14% myelocytes and 1 nucleated red blood cell per 100 WBC's.  Her hemoglobin was stable at 11.3 but her platelet count was mildly lower at 74,000 and a smear revealed occasional teardrops.  JAK 2 mutation was negative.  We therefore performed a bone marrow on August 22nd and this was consistent with myelofibrosis.  The cytogenetics revealed normal 35 XX chromosomes and PCR for bcr/ABL was negative.  The sample was very limited as the aspirate was mainly blood.  There was a predominance of granulocytes, red cells and occasional atypical megakaryocytes associated with reticulin fibrosis.  She did not follow up after that.  INTERVAL HISTORY:  Tran is here to reestablish care at our facility.  She has not been seen in the clinic since November 2018.  Since that time she has had a stroke, and she has worsening vascular dementia.  She is currently on oral iron which she has been on for at least 1 year.  Lab work from early December revealed a white count of 7.1, a hemoglobin of 10.0 and a platelet count of 94,000.  Differential revealed 80.9% neutrophils, 9.2% lymphocytes, 8.1% monocytes and 0.8% eosinophils.  Her hemoglobin today is 8.8, her platelet count is 73,000 and her white count is normal.   Differential revealed 2 nucleated red blood cells, 61% segmented neutrophils, 10% bands, 8% monocytes, 1% eosinophils, and 20% lymphocytes. Chemistries are remarkable for a BUN of 66, a creatinine of 1.5, and an LDH is elevated at 1016.  Her  appetite is good, and she is eating well.  She denies fever, chills or other signs of infection.  She denies nausea, vomiting, bowel issues, or abdominal pain.  She denies sore  throat, cough, dyspnea, or chest pain.  REVIEW OF SYSTEMS:  Review of Systems  Constitutional: Positive for fatigue (sleeps about 50% of the day).  HENT:   Positive for mouth sores (treated with ointment and mouthwash).   Eyes: Negative.   Respiratory: Positive for cough (productive).   Cardiovascular: Negative.   Gastrointestinal: Negative.   Endocrine: Negative.   Genitourinary: Negative.    Musculoskeletal: Positive for arthralgias (especially of the knees) and back pain.  Skin: Negative.   Neurological: Negative.   Hematological: Negative.   Psychiatric/Behavioral: Positive for confusion (she does have dementia).     VITALS:  Blood pressure (!) 152/67, pulse 72, temperature 98.1 F (36.7 C), resp. rate 16, height 5' 2.5" (1.588 m), weight 225 lb 14.4 oz (102.5 kg), SpO2 93 %.  Wt Readings from Last 3 Encounters:  04/03/20 225 lb 14.4 oz (102.5 kg)  03/05/20 226 lb (102.5 kg)  12/11/19 228 lb (103.4 kg)    Body mass index is 40.66 kg/m.  Performance status (ECOG): 2 - Symptomatic, <50% confined to bed  PHYSICAL EXAM:  Physical Exam Constitutional:      General: She is not in acute distress.    Appearance: Normal appearance. She is normal weight.  HENT:     Head: Normocephalic and atraumatic.  Eyes:     General: No scleral icterus.    Extraocular Movements: Extraocular movements intact.     Conjunctiva/sclera: Conjunctivae normal.     Pupils: Pupils are equal, round, and reactive to light.  Cardiovascular:     Rate and Rhythm: Normal rate and regular rhythm.     Pulses: Normal pulses.     Heart sounds: Normal heart sounds. No murmur heard. No friction rub. No gallop.   Pulmonary:     Effort: Pulmonary effort is normal. No respiratory distress.     Breath sounds: Normal breath sounds.  Chest:     Comments: Tiny cyst of the left breast in the upper inner quadrant which does not feel suspcious. Abdominal:     General: Bowel sounds are normal. There is no  distension.     Palpations: Abdomen is soft. There is splenomegaly (mild to moderate). There is no hepatomegaly or mass.     Tenderness: There is no abdominal tenderness.  Musculoskeletal:        General: Normal range of motion.     Cervical back: Normal range of motion and neck supple.     Right lower leg: Tenderness present. Edema (2+) present.     Left lower leg: Tenderness present. Edema (2+) present.     Comments: She has swelling of her fingers consistent with arthritis  Lymphadenopathy:     Cervical: No cervical adenopathy.  Skin:    General: Skin is warm and dry.  Neurological:     General: No focal deficit present.     Mental Status: She is alert and oriented to person, place, and time. Mental status is at baseline.  Psychiatric:        Mood and Affect: Mood normal.        Behavior: Behavior normal.        Thought Content: Thought content normal.        Judgment: Judgment normal.     LABS:   CBC Latest Ref Rng & Units 04/03/2020 03/05/2020 06/14/2019  WBC - 5.3 7.1 7.6  Hemoglobin 12.0 - 16.0 8.8(A) 10.0(L) 9.5(L)  Hematocrit 36 - 46 27(A) 30.6(L) 28.8(L)  Platelets 150 - 399 73(A) 94.0(L) 110.0(L)   CMP Latest Ref Rng & Units 04/03/2020 03/05/2020 07/24/2019  Glucose 70 - 99 mg/dL - 133(H) 97  BUN 4 - 21 66(A) 72(H) 59(H)  Creatinine 0.5 - 1.1 1.5(A) 1.62(H) 1.51(H)  Sodium 137 - 147 139 137 140  Potassium 3.4 - 5.3 4.1 4.2 4.3  Chloride 99 - 108 105 100 103  CO2 13 - 22 24(A) 29 27  Calcium 8.7 - 10.7 9.0 9.6 9.1  Total Protein 6.0 - 8.3 g/dL - 7.3 6.7  Total Bilirubin 0.2 - 1.2 mg/dL - 0.7 0.6  Alkaline Phos 25 - 125 54 53 60  AST 13 - 35 36(A) 28 27  ALT 7 - 35 25 20 17      No results found for: Ronnald Ramp, A1GS, A2GS, BETS, BETA2SER, GAMS, MSPIKE, SPEI Lab Results  Component Value Date   TIBC 389 04/03/2020   TIBC 362 07/27/2012   FERRITIN 92.4 04/03/2020   FERRITIN 106.7 07/26/2019   FERRITIN 44 07/27/2012   IRONPCTSAT 15.1 04/03/2020    IRONPCTSAT 11.6 (L) 03/05/2020   IRONPCTSAT 14.0 (L) 07/26/2019   Lab Results  Component Value Date   LDH 1,016 (A) 04/03/2020    STUDIES:  DG Chest 2 View  Result Date: 03/05/2020 CLINICAL DATA:  Shortness of breath. EXAM: CHEST - 2 VIEW COMPARISON:  April 17, 2017. FINDINGS: Stable cardiomegaly. No pneumothorax or pleural effusion is noted. Both lungs are clear. The visualized skeletal structures are unremarkable. IMPRESSION: No active cardiopulmonary disease. Electronically Signed   By: Marijo Conception M.D.   On: 03/05/2020 15:49   CUP PACEART REMOTE DEVICE CHECK  Result Date: 03/16/2020 ILR summary report received. Battery status OK. Noise on presenting.  Normal device function. No new symptom, tachy, brady, or pause episodes. No new AF episodes. Monthly summary reports and ROV/PRN     HISTORY:   Past Medical History:  Diagnosis Date  . Asthma   . CHF (congestive heart failure) [facility])    hospital 11/[facility]-- [place]  . Chronic kidney disease   . Depression   . Diabetes mellitus ([place])   . GERD (gastroesophageal reflux disease)   . Hyperlipidemia   . Hypertension   . Stroke [facility])     Past Surgical History:  Procedure Laterality Date  . CHOLECYSTECTOMY  2002  . LOOP RECORDER INSERTION N/A 08/01/2017   Procedure: LOOP RECORDER INSERTION;  Surgeon: Constance Haw, MD;  Location: [facility];  Service: Cardiovascular;  Laterality: N/A;  . SPINE SURGERY  aug 2015   [facility]  . TEE WITHOUT CARDIOVERSION N/A 08/01/2017   Procedure: TRANSESOPHAGEAL ECHOCARDIOGRAM (TEE);  Surgeon: Sanda Klein, MD;  Location: [facility] ENDOSCOPY;  Service: Cardiovascular;  Laterality: N/A;  . TONSILLECTOMY    . TUBAL LIGATION      Family History  Problem Relation Age of Onset  . Hypertension Mother   . Alzheimer's disease Mother   . Hyperlipidemia Mother   . Heart disease Father  cad  . Asthma Father   . Cancer Father 73       leukemia  . Stroke Father    . Hypertension Father   . Heart disease Sister 14       MI  . Pulmonary fibrosis Sister   . Arthritis Brother   . Heart disease Maternal Uncle   . Stroke Maternal Uncle   . Uterine cancer Paternal Grandmother   . Stomach cancer Paternal Grandfather   . Heart disease Maternal Uncle   . Heart disease Maternal Uncle   . Cancer Maternal Aunt        leukemia    Social History:  reports that she quit smoking about 35 years ago. Her smoking use included cigarettes. She has a 30.00 pack-year smoking history. She has never used smokeless tobacco. She reports that she does not drink alcohol and does not use drugs.The patient is accompanied by her daughter today.  Her daughter Marcie Bal is her POA.  She is widowed, and lives at home alone with family nearby.  Allergies:  Allergies  Allergen Reactions  . Bee Venom     Current Medications: Current Outpatient Medications  Medication Sig Dispense Refill  . Acetaminophen (TYLENOL 8 HOUR PO) Take 500 mg by mouth.    Marland Kitchen albuterol (PROVENTIL) (2.5 MG/3ML) 0.083% nebulizer solution Take 3 mLs (2.5 mg total) by nebulization every 6 (six) hours as needed for wheezing or shortness of breath. 75 mL 12  . albuterol (VENTOLIN HFA) 108 (90 Base) MCG/ACT inhaler Inhale 2 puffs into the lungs every 4 (four) hours as needed for wheezing or shortness of breath.     . allopurinol (ZYLOPRIM) 100 MG tablet Take 1 tablet (100 mg total) by mouth daily. 30 tablet 0  . AMBULATORY NON FORMULARY MEDICATION Rollator 1 Units 0  . atorvastatin (LIPITOR) 40 MG tablet Take 1 tablet (40 mg total) by mouth daily. Pt needs a OV for further refills 30 tablet 0  . BD PEN NEEDLE NANO 2ND GEN 32G X 4 MM MISC USE 4 TIMES A DAY 300 each 3  . busPIRone (BUSPAR) 15 MG tablet TAKE 1 TABLET BY MOUTH 3 TIMES DAILY. 270 tablet 1  . carvedilol (COREG) 6.25 MG tablet Take 1.5 tablets (9.375 mg total) by mouth 2 (two) times daily with a meal. 270 tablet 3  . clopidogrel (PLAVIX) 75 MG tablet Take  1 tablet (75 mg total) by mouth daily. 30 tablet 0  . diclofenac sodium (VOLTAREN) 1 % GEL Apply 4 g topically 4 (four) times daily. 100 g 2  . EPINEPHrine 0.3 mg/0.3 mL IJ SOAJ injection PLEASE SEE ATTACHED FOR DETAILED DIRECTIONS    . ergocalciferol (VITAMIN D2) 1.25 MG (50000 UT) capsule Take 1 capsule (50,000 Units total) by mouth once a week. Wednesday 12 capsule 1  . fenofibrate micronized (LOFIBRA) 200 MG capsule TAKE 1 CAPSULE BY MOUTH EVERY DAY 90 capsule 1  . ferrous sulfate 325 (65 FE) MG tablet Take 325 mg by mouth.    . Fluticasone-Salmeterol (ADVAIR) 250-50 MCG/DOSE AEPB Inhale 1 puff into the lungs 2 (two) times daily.    Marland Kitchen gabapentin (NEURONTIN) 100 MG capsule TAKE 1 CAPSULE BY MOUTH AT BEDTIME. 90 capsule 1  . glucose blood test strip Use 3x a day - One TOUch Ultra 300 each 3  . Insulin Glargine (BASAGLAR KWIKPEN) 100 UNIT/ML INJECT 0.4 MLS (40 UNITS TOTAL) INTO THE SKIN AT BEDTIME. 45 pen 0  . pantoprazole (PROTONIX) 40 MG tablet Take 1 tablet (  40 mg total) by mouth daily. 30 tablet 0  . pramipexole (MIRAPEX) 1 MG tablet Take 1 tablet (1 mg total) by mouth at bedtime. 30 tablet 0  . sertraline (ZOLOFT) 50 MG tablet TAKE 1 AND 1/2 TABLETS DAILY BY MOUTH 135 tablet 1  . torsemide (DEMADEX) 20 MG tablet TAKE FOUR TABLETS BY MOUTH TWICE DAILY     No current facility-administered medications for this visit.     ASSESSMENT & PLAN:   Assessment:   1.  Myelofibrosis, diagnosed in August 2018.  I again reviewed and discussed her diagnosis and its symptoms.  This likely will not threaten her life, and so routine surveillance is recommended.  I do expect that her blood counts will slowly worsen over time and there is a slight possibility of evolution into leukemia.    2.  Probable iron deficiency not responding to oral iron supplement and aggravated by her myelofibrosis.  We will plan to administer 2 doses of IV iron.    Plan: This is a 74 year old female who I have seen previously  with myelofibrosis and multiple comorbidities who is here to reestablish care at our facility.  I again reviewed her diagnosis and its symptoms.  At this time, I would recommend surveillance to monitor her blood counts, which have remained fairly stable over the past few years.  I do feel she has some degree of iron deficiency which is not responding to oral iron.  This is likely aggravated by her myelofibrosis.  I will check for evidence of hemolysis and rule out other deficiency.  We will arrange for two doses of IV iron within the next couple of weeks.  I would like her family doctor to draw a CBC in 6 months.  We will see her back in 1 year with repeat CBC and comprehensive metabolic profile.  She understands and agrees with this plan of care.  Thank you for the opportunity to participate in the care of your patients   I provided 50 minutes of face-to-face time during this this encounter and > 50% was spent counseling as documented under my assessment and plan.   ADDENDUM:  Review of her peripheral blood smear reveals many teardrops, 3% metamyelocytes, 4% myelocytes, 2% promyelocytes and 1 Blast seen.   Derwood Kaplan, MD [facility] [address] [place] [address] Dept: [phone] Dept Fax: [phone]   I, Rita Ohara, am acting as scribe for Derwood Kaplan, MD  I have reviewed this report as typed by the medical scribe, and it is complete and accurate.

## 2020-04-03 ENCOUNTER — Other Ambulatory Visit: Payer: Self-pay | Admitting: Family Medicine

## 2020-04-03 ENCOUNTER — Inpatient Hospital Stay (INDEPENDENT_AMBULATORY_CARE_PROVIDER_SITE_OTHER): Payer: Medicare Other | Admitting: Oncology

## 2020-04-03 ENCOUNTER — Encounter: Payer: Self-pay | Admitting: Oncology

## 2020-04-03 ENCOUNTER — Inpatient Hospital Stay: Payer: Medicare Other | Attending: Oncology

## 2020-04-03 ENCOUNTER — Other Ambulatory Visit: Payer: Self-pay | Admitting: Hematology and Oncology

## 2020-04-03 ENCOUNTER — Other Ambulatory Visit: Payer: Self-pay

## 2020-04-03 VITALS — BP 152/67 | HR 72 | Temp 98.1°F | Resp 16 | Ht 62.5 in | Wt 225.9 lb

## 2020-04-03 DIAGNOSIS — D696 Thrombocytopenia, unspecified: Secondary | ICD-10-CM

## 2020-04-03 DIAGNOSIS — K219 Gastro-esophageal reflux disease without esophagitis: Secondary | ICD-10-CM

## 2020-04-03 DIAGNOSIS — I639 Cerebral infarction, unspecified: Secondary | ICD-10-CM | POA: Diagnosis not present

## 2020-04-03 DIAGNOSIS — D474 Osteomyelofibrosis: Secondary | ICD-10-CM

## 2020-04-03 DIAGNOSIS — D649 Anemia, unspecified: Secondary | ICD-10-CM | POA: Diagnosis not present

## 2020-04-03 DIAGNOSIS — D473 Essential (hemorrhagic) thrombocythemia: Secondary | ICD-10-CM | POA: Diagnosis not present

## 2020-04-03 DIAGNOSIS — D509 Iron deficiency anemia, unspecified: Secondary | ICD-10-CM | POA: Insufficient documentation

## 2020-04-03 DIAGNOSIS — E538 Deficiency of other specified B group vitamins: Secondary | ICD-10-CM | POA: Diagnosis not present

## 2020-04-03 DIAGNOSIS — Z0001 Encounter for general adult medical examination with abnormal findings: Secondary | ICD-10-CM | POA: Diagnosis not present

## 2020-04-03 DIAGNOSIS — D7581 Myelofibrosis: Secondary | ICD-10-CM | POA: Diagnosis not present

## 2020-04-03 LAB — IRON,TIBC AND FERRITIN PANEL
%SAT: 15.1
Ferritin: 92.4
Iron: 59
TIBC: 389

## 2020-04-03 LAB — VITAMIN B12: Vitamin B-12: 404

## 2020-04-03 LAB — BASIC METABOLIC PANEL
BUN: 66 — AB (ref 4–21)
CO2: 24 — AB (ref 13–22)
Chloride: 105 (ref 99–108)
Creatinine: 1.5 — AB (ref 0.5–1.1)
Glucose: 140
Potassium: 4.1 (ref 3.4–5.3)
Sodium: 139 (ref 137–147)

## 2020-04-03 LAB — FOLATE: Folate: 8.83

## 2020-04-03 LAB — CBC AND DIFFERENTIAL
HCT: 27 — AB (ref 36–46)
Hemoglobin: 8.8 — AB (ref 12.0–16.0)
Neutrophils Absolute: 3.76
Platelets: 73 — AB (ref 150–399)
WBC: 5.3

## 2020-04-03 LAB — CBC
LDH: 1016 — AB (ref 313–618)
RBC: 3.03 — AB (ref 3.87–5.11)

## 2020-04-03 LAB — HEPATIC FUNCTION PANEL
ALT: 25 (ref 7–35)
AST: 36 — AB (ref 13–35)
Alkaline Phosphatase: 54 (ref 25–125)
Bilirubin, Total: 0.5

## 2020-04-03 LAB — SEDIMENTATION RATE: Sed Rate: 58 mm/hr — ABNORMAL HIGH (ref 0–22)

## 2020-04-03 LAB — COMPREHENSIVE METABOLIC PANEL
Albumin: 4 (ref 3.5–5.0)
Calcium: 9 (ref 8.7–10.7)

## 2020-04-04 LAB — HAPTOGLOBIN: Haptoglobin: 75 mg/dL (ref 42–346)

## 2020-04-07 LAB — SOLUBLE TRANSFERRIN RECEPTOR: Transferrin Receptor: 30.4 nmol/L — ABNORMAL HIGH (ref 12.2–27.3)

## 2020-04-09 ENCOUNTER — Other Ambulatory Visit: Payer: Self-pay | Admitting: Oncology

## 2020-04-09 DIAGNOSIS — D473 Essential (hemorrhagic) thrombocythemia: Secondary | ICD-10-CM

## 2020-04-09 DIAGNOSIS — D474 Osteomyelofibrosis: Secondary | ICD-10-CM

## 2020-04-11 ENCOUNTER — Telehealth: Payer: Self-pay

## 2020-04-11 NOTE — Telephone Encounter (Signed)
Patients daughter Marcie Bal regarding information from Dr. Hinton Rao.

## 2020-04-11 NOTE — Telephone Encounter (Signed)
-----   Message from Derwood Kaplan, MD sent at 04/09/2020  7:42 PM EST ----- Regarding: call daughter Marcie Bal Tell her blood smear is stable and other tests normal.  She has myelofibrosis, something to live with, and the iron deficiency.  I will plan to see her back in 1 year but will request CBC in 6 months by PCP

## 2020-04-14 NOTE — Addendum Note (Signed)
Addended by: Juanetta Beets on: 04/14/2020 09:48 AM   Modules accepted: Orders

## 2020-04-18 ENCOUNTER — Other Ambulatory Visit: Payer: Self-pay

## 2020-04-18 ENCOUNTER — Inpatient Hospital Stay: Payer: Medicare Other | Attending: Oncology

## 2020-04-18 VITALS — BP 138/45 | HR 81 | Temp 97.6°F | Resp 20 | Ht 62.5 in | Wt 223.8 lb

## 2020-04-18 DIAGNOSIS — Z806 Family history of leukemia: Secondary | ICD-10-CM | POA: Insufficient documentation

## 2020-04-18 DIAGNOSIS — Z818 Family history of other mental and behavioral disorders: Secondary | ICD-10-CM | POA: Diagnosis not present

## 2020-04-18 DIAGNOSIS — Z823 Family history of stroke: Secondary | ICD-10-CM | POA: Diagnosis not present

## 2020-04-18 DIAGNOSIS — D7581 Myelofibrosis: Secondary | ICD-10-CM | POA: Insufficient documentation

## 2020-04-18 DIAGNOSIS — Z836 Family history of other diseases of the respiratory system: Secondary | ICD-10-CM | POA: Diagnosis not present

## 2020-04-18 DIAGNOSIS — Z8261 Family history of arthritis: Secondary | ICD-10-CM | POA: Diagnosis not present

## 2020-04-18 DIAGNOSIS — I10 Essential (primary) hypertension: Secondary | ICD-10-CM | POA: Insufficient documentation

## 2020-04-18 DIAGNOSIS — Z800 Family history of malignant neoplasm of digestive organs: Secondary | ICD-10-CM | POA: Diagnosis not present

## 2020-04-18 DIAGNOSIS — E611 Iron deficiency: Secondary | ICD-10-CM | POA: Insufficient documentation

## 2020-04-18 DIAGNOSIS — Z8349 Family history of other endocrine, nutritional and metabolic diseases: Secondary | ICD-10-CM | POA: Diagnosis not present

## 2020-04-18 DIAGNOSIS — I509 Heart failure, unspecified: Secondary | ICD-10-CM | POA: Insufficient documentation

## 2020-04-18 DIAGNOSIS — E785 Hyperlipidemia, unspecified: Secondary | ICD-10-CM | POA: Diagnosis not present

## 2020-04-18 DIAGNOSIS — Z8049 Family history of malignant neoplasm of other genital organs: Secondary | ICD-10-CM | POA: Diagnosis not present

## 2020-04-18 DIAGNOSIS — Z79899 Other long term (current) drug therapy: Secondary | ICD-10-CM | POA: Insufficient documentation

## 2020-04-18 DIAGNOSIS — Z8249 Family history of ischemic heart disease and other diseases of the circulatory system: Secondary | ICD-10-CM | POA: Diagnosis not present

## 2020-04-18 DIAGNOSIS — D509 Iron deficiency anemia, unspecified: Secondary | ICD-10-CM

## 2020-04-18 MED ORDER — SODIUM CHLORIDE 0.9 % IV SOLN
Freq: Once | INTRAVENOUS | Status: AC
  Filled 2020-04-18: qty 250

## 2020-04-18 MED ORDER — SODIUM CHLORIDE 0.9 % IV SOLN
510.0000 mg | Freq: Once | INTRAVENOUS | Status: AC
  Administered 2020-04-18: 510 mg via INTRAVENOUS
  Filled 2020-04-18: qty 17

## 2020-04-18 NOTE — Patient Instructions (Signed)
Ferumoxytol injection What is this medicine? FERUMOXYTOL is an iron complex. Iron is used to make healthy red blood cells, which carry oxygen and nutrients throughout the body. This medicine is used to treat iron deficiency anemia. This medicine may be used for other purposes; ask your health care provider or pharmacist if you have questions. COMMON BRAND NAME(S): Feraheme What should I tell my health care provider before I take this medicine? They need to know if you have any of these conditions:  anemia not caused by low iron levels  high levels of iron in the blood  magnetic resonance imaging (MRI) test scheduled  an unusual or allergic reaction to iron, other medicines, foods, dyes, or preservatives  pregnant or trying to get pregnant  breast-feeding How should I use this medicine? This medicine is for injection into a vein. It is given by a health care professional in a hospital or clinic setting. Talk to your pediatrician regarding the use of this medicine in children. Special care may be needed. Overdosage: If you think you have taken too much of this medicine contact a poison control center or emergency room at once. NOTE: This medicine is only for you. Do not share this medicine with others. What if I miss a dose? It is important not to miss your dose. Call your doctor or health care professional if you are unable to keep an appointment. What may interact with this medicine? This medicine may interact with the following medications:  other iron products This list may not describe all possible interactions. Give your health care provider a list of all the medicines, herbs, non-prescription drugs, or dietary supplements you use. Also tell them if you smoke, drink alcohol, or use illegal drugs. Some items may interact with your medicine. What should I watch for while using this medicine? Visit your doctor or healthcare professional regularly. Tell your doctor or healthcare  professional if your symptoms do not start to get better or if they get worse. You may need blood work done while you are taking this medicine. You may need to follow a special diet. Talk to your doctor. Foods that contain iron include: whole grains/cereals, dried fruits, beans, or peas, leafy green vegetables, and organ meats (liver, kidney). What side effects may I notice from receiving this medicine? Side effects that you should report to your doctor or health care professional as soon as possible:  allergic reactions like skin rash, itching or hives, swelling of the face, lips, or tongue  breathing problems  changes in blood pressure  feeling faint or lightheaded, falls  fever or chills  flushing, sweating, or hot feelings  swelling of the ankles or feet Side effects that usually do not require medical attention (report to your doctor or health care professional if they continue or are bothersome):  diarrhea  headache  nausea, vomiting  stomach pain This list may not describe all possible side effects. Call your doctor for medical advice about side effects. You may report side effects to FDA at 1-800-FDA-1088. Where should I keep my medicine? This drug is given in a hospital or clinic and will not be stored at home. NOTE: This sheet is a summary. It may not cover all possible information. If you have questions about this medicine, talk to your doctor, pharmacist, or health care provider.  2021 Elsevier/Gold Standard (2016-05-10 20:21:10)  

## 2020-04-21 ENCOUNTER — Ambulatory Visit (INDEPENDENT_AMBULATORY_CARE_PROVIDER_SITE_OTHER): Payer: Medicare Other

## 2020-04-21 DIAGNOSIS — I639 Cerebral infarction, unspecified: Secondary | ICD-10-CM

## 2020-04-23 LAB — CUP PACEART REMOTE DEVICE CHECK
Date Time Interrogation Session: 20220115235509
Implantable Pulse Generator Implant Date: 20190429

## 2020-04-24 ENCOUNTER — Other Ambulatory Visit: Payer: Self-pay | Admitting: Family Medicine

## 2020-04-24 NOTE — Addendum Note (Signed)
Addended by: Juanetta Beets on: 04/24/2020 09:06 AM   Modules accepted: Orders

## 2020-04-25 ENCOUNTER — Inpatient Hospital Stay: Payer: Medicare Other

## 2020-04-25 ENCOUNTER — Other Ambulatory Visit: Payer: Self-pay

## 2020-04-25 VITALS — BP 138/46 | HR 79 | Temp 97.6°F | Resp 20 | Ht 62.5 in | Wt 218.8 lb

## 2020-04-25 DIAGNOSIS — I509 Heart failure, unspecified: Secondary | ICD-10-CM | POA: Diagnosis not present

## 2020-04-25 DIAGNOSIS — D7581 Myelofibrosis: Secondary | ICD-10-CM | POA: Diagnosis not present

## 2020-04-25 DIAGNOSIS — D509 Iron deficiency anemia, unspecified: Secondary | ICD-10-CM

## 2020-04-25 DIAGNOSIS — E785 Hyperlipidemia, unspecified: Secondary | ICD-10-CM | POA: Diagnosis not present

## 2020-04-25 DIAGNOSIS — E611 Iron deficiency: Secondary | ICD-10-CM | POA: Diagnosis not present

## 2020-04-25 DIAGNOSIS — I10 Essential (primary) hypertension: Secondary | ICD-10-CM | POA: Diagnosis not present

## 2020-04-25 DIAGNOSIS — Z79899 Other long term (current) drug therapy: Secondary | ICD-10-CM | POA: Diagnosis not present

## 2020-04-25 MED ORDER — SODIUM CHLORIDE 0.9 % IV SOLN
Freq: Once | INTRAVENOUS | Status: AC
  Filled 2020-04-25: qty 250

## 2020-04-25 MED ORDER — SODIUM CHLORIDE 0.9 % IV SOLN
510.0000 mg | Freq: Once | INTRAVENOUS | Status: AC
  Administered 2020-04-25: 510 mg via INTRAVENOUS
  Filled 2020-04-25: qty 17

## 2020-04-25 NOTE — Patient Instructions (Signed)
Ferumoxytol injection What is this medicine? FERUMOXYTOL is an iron complex. Iron is used to make healthy red blood cells, which carry oxygen and nutrients throughout the body. This medicine is used to treat iron deficiency anemia. This medicine may be used for other purposes; ask your health care provider or pharmacist if you have questions. COMMON BRAND NAME(S): Feraheme What should I tell my health care provider before I take this medicine? They need to know if you have any of these conditions:  anemia not caused by low iron levels  high levels of iron in the blood  magnetic resonance imaging (MRI) test scheduled  an unusual or allergic reaction to iron, other medicines, foods, dyes, or preservatives  pregnant or trying to get pregnant  breast-feeding How should I use this medicine? This medicine is for injection into a vein. It is given by a health care professional in a hospital or clinic setting. Talk to your pediatrician regarding the use of this medicine in children. Special care may be needed. Overdosage: If you think you have taken too much of this medicine contact a poison control center or emergency room at once. NOTE: This medicine is only for you. Do not share this medicine with others. What if I miss a dose? It is important not to miss your dose. Call your doctor or health care professional if you are unable to keep an appointment. What may interact with this medicine? This medicine may interact with the following medications:  other iron products This list may not describe all possible interactions. Give your health care provider a list of all the medicines, herbs, non-prescription drugs, or dietary supplements you use. Also tell them if you smoke, drink alcohol, or use illegal drugs. Some items may interact with your medicine. What should I watch for while using this medicine? Visit your doctor or healthcare professional regularly. Tell your doctor or healthcare  professional if your symptoms do not start to get better or if they get worse. You may need blood work done while you are taking this medicine. You may need to follow a special diet. Talk to your doctor. Foods that contain iron include: whole grains/cereals, dried fruits, beans, or peas, leafy green vegetables, and organ meats (liver, kidney). What side effects may I notice from receiving this medicine? Side effects that you should report to your doctor or health care professional as soon as possible:  allergic reactions like skin rash, itching or hives, swelling of the face, lips, or tongue  breathing problems  changes in blood pressure  feeling faint or lightheaded, falls  fever or chills  flushing, sweating, or hot feelings  swelling of the ankles or feet Side effects that usually do not require medical attention (report to your doctor or health care professional if they continue or are bothersome):  diarrhea  headache  nausea, vomiting  stomach pain This list may not describe all possible side effects. Call your doctor for medical advice about side effects. You may report side effects to FDA at 1-800-FDA-1088. Where should I keep my medicine? This drug is given in a hospital or clinic and will not be stored at home. NOTE: This sheet is a summary. It may not cover all possible information. If you have questions about this medicine, talk to your doctor, pharmacist, or health care provider.  2021 Elsevier/Gold Standard (2016-05-10 20:21:10)  

## 2020-05-02 ENCOUNTER — Other Ambulatory Visit: Payer: Self-pay | Admitting: Family Medicine

## 2020-05-05 NOTE — Progress Notes (Signed)
Carelink Summary Report / Loop Recorder 

## 2020-05-14 ENCOUNTER — Other Ambulatory Visit: Payer: Self-pay | Admitting: Family Medicine

## 2020-05-21 NOTE — Progress Notes (Unsigned)
[facility] [address] [place] Phone: [phone] Subjective:   I Erin [name] am serving as a Education administrator for Dr. Hulan Saas.  This visit occurred during the SARS-CoV-2 public health emergency.  Safety protocols were in place, including screening questions prior to the visit, additional usage of staff PPE, and extensive cleaning of exam room while observing appropriate contact time as indicated for disinfecting solutions.   I'm seeing this patient by the request  of:  Ann Held, DO  CC: Knee pain and allover pain follow-up  AYT:[id number]   03/05/2020 Bilateral arthritic changes of the knees.  Repeated injections today to give her some relief.  Secondary to patient's comorbidities patient is not a candidate for surgical intervention.  Has significant dementia at baseline.  Patient is accompanied with grandson today.  We discussed laboratory work-up secondary to patient's history of congestive heart failure and worsening swelling of the lower extremities.  Patient knows if any worsening symptoms to seek medical attention immediately and go to the emergency room.  Patient is in agreement with the plan discussed with grandson.  Patient grandson states that he lives behind her has noticed more increasing shortness of breath minorly.  Patient will watch closely.  Update 05/22/2020 Erin [name] is a 75 y.o. female coming in with complaint of bilateral knee pain. Patient states she is in pain as well as her back. She would like bilateral injections today.  Patient does have dementia at baseline.  Family says she has been complaining more of pain recently.    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  She is in the process of also having IV iron.  Past Medical History:  Diagnosis Date  . Asthma    . CHF (congestive heart failure) [facility])    hospital 11/2013-- [place]  . Chronic kidney disease   . Depression   . Diabetes mellitus ([place])   . GERD (gastroesophageal reflux disease)   . Hyperlipidemia   . Hypertension   . Stroke [facility])    Past Surgical History:  Procedure Laterality Date  . CHOLECYSTECTOMY  2002  . LOOP RECORDER INSERTION N/A 08/01/2017   Procedure: LOOP RECORDER INSERTION;  Surgeon: Constance Haw, MD;  Location: [facility];  Service: Cardiovascular;  Laterality: N/A;  . SPINE SURGERY  aug 2015   [facility]  . TEE WITHOUT CARDIOVERSION N/A 08/01/2017   Procedure: TRANSESOPHAGEAL ECHOCARDIOGRAM (TEE);  Surgeon: Sanda Klein, MD;  Location: [facility] ENDOSCOPY;  Service: Cardiovascular;  Laterality: N/A;  . TONSILLECTOMY    . TUBAL LIGATION     Social History   Socioeconomic History  . Marital status: Widowed    Spouse name: Not on file  . Number of children: 4  . Years of education: Not on file  . Highest education level: Not on file  Occupational History  . Occupation: [employer] @ [employer]: [employer]  Tobacco Use  . Smoking status: Former Smoker    Packs/day: 1.50    Years: 20.00    Pack years: 30.00    Types: Cigarettes    Quit date: 08/02/1984    Years since quitting: 35.8  . Smokeless tobacco: Never Used  Vaping Use  . Vaping Use: Never used  Substance and Sexual Activity  . Alcohol use: No  .  Drug use: No  . Sexual activity: Not Currently    Partners: Male  Other Topics Concern  . Not on file  Social History Narrative   NO REG EXERCISE   Caffeine use: daily   Social Determinants of Health   Financial Resource Strain: Not on file  Food Insecurity: Not on file  Transportation Needs: Not on file  Physical Activity: Not on file  Stress: Not on file  Social Connections: Not on file   Allergies  Allergen Reactions  . Bee Venom    Family History  Problem Relation Age of Onset  . Hypertension Mother    . Alzheimer's disease Mother   . Hyperlipidemia Mother   . Heart disease Father        cad  . Asthma Father   . Cancer Father 34       leukemia  . Stroke Father   . Hypertension Father   . Heart disease Sister 72       MI  . Pulmonary fibrosis Sister   . Arthritis Brother   . Heart disease Maternal Uncle   . Stroke Maternal Uncle   . Uterine cancer Paternal Grandmother   . Stomach cancer Paternal Grandfather   . Heart disease Maternal Uncle   . Heart disease Maternal Uncle   . Cancer Maternal Aunt        leukemia    Current Outpatient Medications (Endocrine & Metabolic):  Marland Kitchen  Insulin Glargine (BASAGLAR KWIKPEN) 100 UNIT/ML, INJECT 0.4 MLS (40 UNITS TOTAL) INTO THE SKIN AT BEDTIME.  Current Outpatient Medications (Cardiovascular):  .  atorvastatin (LIPITOR) 40 MG tablet, TAKE 1 TABLET BY MOUTH EVERY DAY .  carvedilol (COREG) 6.25 MG tablet, Take 1.5 tablets (9.375 mg total) by mouth 2 (two) times daily with a meal. .  EPINEPHrine 0.3 mg/0.3 mL IJ SOAJ injection, PLEASE SEE ATTACHED FOR DETAILED DIRECTIONS .  fenofibrate micronized (LOFIBRA) 200 MG capsule, Take 1 capsule (200 mg total) by mouth daily. Pt need OV for further refills .  torsemide (DEMADEX) 20 MG tablet, TAKE FOUR TABLETS BY MOUTH TWICE DAILY  Current Outpatient Medications (Respiratory):  .  albuterol (PROVENTIL) (2.5 MG/3ML) 0.083% nebulizer solution, Take 3 mLs (2.5 mg total) by nebulization every 6 (six) hours as needed for wheezing or shortness of breath. Marland Kitchen  albuterol (VENTOLIN HFA) 108 (90 Base) MCG/ACT inhaler, Inhale 2 puffs into the lungs every 4 (four) hours as needed for wheezing or shortness of breath.  .  Fluticasone-Salmeterol (ADVAIR) 250-50 MCG/DOSE AEPB, Inhale 1 puff into the lungs 2 (two) times daily.  Current Outpatient Medications (Analgesics):  Marland Kitchen  Acetaminophen (TYLENOL 8 HOUR PO), Take 500 mg by mouth. Marland Kitchen  allopurinol (ZYLOPRIM) 100 MG tablet, Take 1 tablet (100 mg total) by mouth  daily.  Current Outpatient Medications (Hematological):  .  clopidogrel (PLAVIX) 75 MG tablet, Take 1 tablet (75 mg total) by mouth daily. .  ferrous sulfate 325 (65 FE) MG tablet, Take 325 mg by mouth.  Current Outpatient Medications (Other):  Marland Kitchen  AMBULATORY NON FORMULARY MEDICATION, Rollator .  BD PEN NEEDLE NANO 2ND GEN 32G X 4 MM MISC, USE 4 TIMES A DAY .  busPIRone (BUSPAR) 15 MG tablet, TAKE 1 TABLET BY MOUTH 3 TIMES DAILY. Marland Kitchen  diclofenac sodium (VOLTAREN) 1 % GEL, Apply 4 g topically 4 (four) times daily. .  ergocalciferol (VITAMIN D2) 1.25 MG (50000 UT) capsule, Take 1 capsule (50,000 Units total) by mouth once a week. Wednesday .  gabapentin (NEURONTIN) 100  MG capsule, TAKE 1 CAPSULE BY MOUTH AT BEDTIME. Marland Kitchen  glucose blood test strip, Use 3x a day - One TOUch Ultra .  pantoprazole (PROTONIX) 40 MG tablet, Take 1 tablet (40 mg total) by mouth daily. .  pramipexole (MIRAPEX) 1 MG tablet, Take 1 tablet (1 mg total) by mouth at bedtime. .  sertraline (ZOLOFT) 50 MG tablet, TAKE 1 AND 1/2 TABLETS DAILY BY MOUTH   Reviewed prior external information including notes and imaging from  primary care provider As well as notes that were available from care everywhere and other healthcare systems.  Past medical history, social, surgical and family history all reviewed in electronic medical record.  No pertanent information unless stated regarding to the chief complaint.   Review of Systems: Significant body aches and muscle aches patient states.  Positive headaches as well.  Objective  Blood pressure (!) 150/60, pulse 74, height 5\' 2"  (1.575 m), weight 224 lb (101.6 kg), SpO2 96 %.   General: Patient appears to be uncomfortable.  Patient is alert but not oriented.Marland Kitchen  overwieght  HEENT: Pupils equal, extraocular movements intact  Respiratory: Patient's speak in full sentences mild shortness of breath at baseline Cardiovascular: 2+ pitting edema with hemosiderin deposits. Gait ambulates with  the aid of a rolling walker  Severe arthritic changes of the knees bilaterally.  Patient does have some limited range of motion in all planes.  Patient is diffusely tender to even light palpation that is very superficial.  Did not do rest of exam secondary to pain  After informed written and verbal consent, patient was seated on exam table. Right knee was prepped with alcohol swab and utilizing anterolateral approach, patient's right knee space was injected with 4:1  marcaine 0.5%: Kenalog 40mg /dL. Patient tolerated the procedure well without immediate complications.  After informed written and verbal consent, patient was seated on exam table. Left knee was prepped with alcohol swab and utilizing anterolateral approach, patient's left knee space was injected with 4:1  marcaine 0.5%: Kenalog 40mg /dL. Patient tolerated the procedure well without immediate complications.   Impression and Recommendations:     The above documentation has been reviewed and is accurate and complete Lyndal Pulley, DO

## 2020-05-22 ENCOUNTER — Other Ambulatory Visit: Payer: Self-pay

## 2020-05-22 ENCOUNTER — Encounter: Payer: Self-pay | Admitting: Family Medicine

## 2020-05-22 ENCOUNTER — Ambulatory Visit (INDEPENDENT_AMBULATORY_CARE_PROVIDER_SITE_OTHER): Payer: Medicare Other | Admitting: Family Medicine

## 2020-05-22 DIAGNOSIS — I639 Cerebral infarction, unspecified: Secondary | ICD-10-CM | POA: Diagnosis not present

## 2020-05-22 DIAGNOSIS — D631 Anemia in chronic kidney disease: Secondary | ICD-10-CM | POA: Diagnosis not present

## 2020-05-22 DIAGNOSIS — M170 Bilateral primary osteoarthritis of knee: Secondary | ICD-10-CM

## 2020-05-22 DIAGNOSIS — M9080 Osteopathy in diseases classified elsewhere, unspecified site: Secondary | ICD-10-CM | POA: Diagnosis not present

## 2020-05-22 DIAGNOSIS — I129 Hypertensive chronic kidney disease with stage 1 through stage 4 chronic kidney disease, or unspecified chronic kidney disease: Secondary | ICD-10-CM | POA: Diagnosis not present

## 2020-05-22 DIAGNOSIS — N183 Chronic kidney disease, stage 3 unspecified: Secondary | ICD-10-CM | POA: Diagnosis not present

## 2020-05-22 DIAGNOSIS — E889 Metabolic disorder, unspecified: Secondary | ICD-10-CM | POA: Diagnosis not present

## 2020-05-22 DIAGNOSIS — E559 Vitamin D deficiency, unspecified: Secondary | ICD-10-CM | POA: Diagnosis not present

## 2020-05-22 DIAGNOSIS — N1832 Chronic kidney disease, stage 3b: Secondary | ICD-10-CM | POA: Diagnosis not present

## 2020-05-22 DIAGNOSIS — E1122 Type 2 diabetes mellitus with diabetic chronic kidney disease: Secondary | ICD-10-CM | POA: Diagnosis not present

## 2020-05-22 NOTE — Assessment & Plan Note (Signed)
Patient was seen again at this time.  Chronic problem with exacerbation.  Patient does have significant dementia and is accompanied with family member.  Bilateral injections given again today.  Tolerated the procedure well.  Worsening pain will consider the possibility again of viscosupplementation.  Patient has not responded significantly well to those over the past.  We will consider it.  Due to patient's other comorbidities patient is not likely a surgical candidate.  Family would not want her to go through it either.  Follow-up with me again in 10 weeks otherwise.

## 2020-05-22 NOTE — Patient Instructions (Signed)
Good to see you I hope this helps a little bit See me again in 10 weeks

## 2020-05-23 LAB — CUP PACEART REMOTE DEVICE CHECK
Date Time Interrogation Session: 20220217235831
Implantable Pulse Generator Implant Date: 20190429

## 2020-05-26 ENCOUNTER — Ambulatory Visit (INDEPENDENT_AMBULATORY_CARE_PROVIDER_SITE_OTHER): Payer: Medicare Other

## 2020-05-26 DIAGNOSIS — I639 Cerebral infarction, unspecified: Secondary | ICD-10-CM

## 2020-05-28 ENCOUNTER — Ambulatory Visit: Payer: Medicare Other | Admitting: Family Medicine

## 2020-05-29 NOTE — Progress Notes (Signed)
Carelink Summary Report / Loop Recorder 

## 2020-06-10 ENCOUNTER — Other Ambulatory Visit: Payer: Self-pay | Admitting: Family Medicine

## 2020-06-13 ENCOUNTER — Other Ambulatory Visit: Payer: Self-pay | Admitting: Adult Health

## 2020-06-13 DIAGNOSIS — I10 Essential (primary) hypertension: Secondary | ICD-10-CM

## 2020-06-14 ENCOUNTER — Other Ambulatory Visit: Payer: Self-pay | Admitting: Family Medicine

## 2020-06-14 DIAGNOSIS — F32A Depression, unspecified: Secondary | ICD-10-CM

## 2020-06-25 ENCOUNTER — Other Ambulatory Visit: Payer: Self-pay

## 2020-06-25 LAB — CUP PACEART REMOTE DEVICE CHECK
Date Time Interrogation Session: 20220323005959
Implantable Pulse Generator Implant Date: 20190429

## 2020-06-25 MED ORDER — BUSPIRONE HCL 15 MG PO TABS: 15.0000 mg | ORAL_TABLET | Freq: Three times a day (TID) | ORAL | 0 refills | Status: AC

## 2020-06-28 LAB — CUP PACEART REMOTE DEVICE CHECK
Date Time Interrogation Session: 20220323005959
Implantable Pulse Generator Implant Date: 20190429

## 2020-06-30 ENCOUNTER — Ambulatory Visit (INDEPENDENT_AMBULATORY_CARE_PROVIDER_SITE_OTHER): Payer: Medicare Other

## 2020-06-30 DIAGNOSIS — I639 Cerebral infarction, unspecified: Secondary | ICD-10-CM

## 2020-07-06 ENCOUNTER — Other Ambulatory Visit: Payer: Self-pay | Admitting: Family Medicine

## 2020-07-06 DIAGNOSIS — K219 Gastro-esophageal reflux disease without esophagitis: Secondary | ICD-10-CM

## 2020-07-07 ENCOUNTER — Other Ambulatory Visit: Payer: Self-pay | Admitting: Family Medicine

## 2020-07-10 NOTE — Progress Notes (Signed)
Carelink Summary Report / Loop Recorder 

## 2020-07-17 ENCOUNTER — Encounter: Payer: Self-pay | Admitting: Family Medicine

## 2020-07-17 NOTE — Telephone Encounter (Signed)
She can inc her fluid pill for 2-3 days but i'm concerned about cellulitis or clot--- she should probably be seen in [facility]

## 2020-07-18 DIAGNOSIS — D509 Iron deficiency anemia, unspecified: Secondary | ICD-10-CM | POA: Diagnosis not present

## 2020-07-18 DIAGNOSIS — M533 Sacrococcygeal disorders, not elsewhere classified: Secondary | ICD-10-CM | POA: Diagnosis not present

## 2020-07-18 DIAGNOSIS — M545 Low back pain, unspecified: Secondary | ICD-10-CM | POA: Diagnosis not present

## 2020-07-18 DIAGNOSIS — M546 Pain in thoracic spine: Secondary | ICD-10-CM | POA: Diagnosis not present

## 2020-07-18 DIAGNOSIS — I5042 Chronic combined systolic (congestive) and diastolic (congestive) heart failure: Secondary | ICD-10-CM | POA: Diagnosis not present

## 2020-07-18 DIAGNOSIS — I10 Essential (primary) hypertension: Secondary | ICD-10-CM | POA: Diagnosis not present

## 2020-07-18 DIAGNOSIS — G8929 Other chronic pain: Secondary | ICD-10-CM | POA: Diagnosis not present

## 2020-07-18 DIAGNOSIS — Y998 Other external cause status: Secondary | ICD-10-CM | POA: Diagnosis not present

## 2020-07-18 DIAGNOSIS — W182XXA Fall in (into) shower or empty bathtub, initial encounter: Secondary | ICD-10-CM | POA: Diagnosis not present

## 2020-07-18 DIAGNOSIS — Z9889 Other specified postprocedural states: Secondary | ICD-10-CM | POA: Diagnosis not present

## 2020-07-18 DIAGNOSIS — I130 Hypertensive heart and chronic kidney disease with heart failure and stage 1 through stage 4 chronic kidney disease, or unspecified chronic kidney disease: Secondary | ICD-10-CM | POA: Diagnosis not present

## 2020-07-18 DIAGNOSIS — Z20822 Contact with and (suspected) exposure to covid-19: Secondary | ICD-10-CM | POA: Diagnosis not present

## 2020-07-18 DIAGNOSIS — R079 Chest pain, unspecified: Secondary | ICD-10-CM | POA: Diagnosis not present

## 2020-07-18 DIAGNOSIS — M160 Bilateral primary osteoarthritis of hip: Secondary | ICD-10-CM | POA: Diagnosis not present

## 2020-07-18 DIAGNOSIS — R4182 Altered mental status, unspecified: Secondary | ICD-10-CM | POA: Diagnosis not present

## 2020-07-18 DIAGNOSIS — R2243 Localized swelling, mass and lump, lower limb, bilateral: Secondary | ICD-10-CM | POA: Diagnosis not present

## 2020-07-18 DIAGNOSIS — M542 Cervicalgia: Secondary | ICD-10-CM | POA: Diagnosis not present

## 2020-07-18 DIAGNOSIS — M25571 Pain in right ankle and joints of right foot: Secondary | ICD-10-CM | POA: Diagnosis not present

## 2020-07-18 DIAGNOSIS — M79671 Pain in right foot: Secondary | ICD-10-CM | POA: Diagnosis not present

## 2020-07-18 DIAGNOSIS — W19XXXA Unspecified fall, initial encounter: Secondary | ICD-10-CM | POA: Diagnosis not present

## 2020-07-18 DIAGNOSIS — D696 Thrombocytopenia, unspecified: Secondary | ICD-10-CM | POA: Diagnosis not present

## 2020-07-18 DIAGNOSIS — R519 Headache, unspecified: Secondary | ICD-10-CM | POA: Diagnosis not present

## 2020-07-19 DIAGNOSIS — R079 Chest pain, unspecified: Secondary | ICD-10-CM | POA: Diagnosis not present

## 2020-07-19 DIAGNOSIS — W19XXXA Unspecified fall, initial encounter: Secondary | ICD-10-CM | POA: Diagnosis not present

## 2020-07-19 DIAGNOSIS — M25571 Pain in right ankle and joints of right foot: Secondary | ICD-10-CM | POA: Diagnosis not present

## 2020-07-19 DIAGNOSIS — Z8673 Personal history of transient ischemic attack (TIA), and cerebral infarction without residual deficits: Secondary | ICD-10-CM | POA: Diagnosis not present

## 2020-07-19 DIAGNOSIS — E1122 Type 2 diabetes mellitus with diabetic chronic kidney disease: Secondary | ICD-10-CM | POA: Diagnosis not present

## 2020-07-19 DIAGNOSIS — M160 Bilateral primary osteoarthritis of hip: Secondary | ICD-10-CM | POA: Diagnosis not present

## 2020-07-19 DIAGNOSIS — M533 Sacrococcygeal disorders, not elsewhere classified: Secondary | ICD-10-CM | POA: Diagnosis not present

## 2020-07-19 DIAGNOSIS — N183 Chronic kidney disease, stage 3 unspecified: Secondary | ICD-10-CM | POA: Diagnosis not present

## 2020-07-19 DIAGNOSIS — M542 Cervicalgia: Secondary | ICD-10-CM | POA: Diagnosis not present

## 2020-07-19 DIAGNOSIS — Z8679 Personal history of other diseases of the circulatory system: Secondary | ICD-10-CM | POA: Diagnosis not present

## 2020-07-19 DIAGNOSIS — R519 Headache, unspecified: Secondary | ICD-10-CM | POA: Diagnosis not present

## 2020-07-19 DIAGNOSIS — Z9889 Other specified postprocedural states: Secondary | ICD-10-CM | POA: Diagnosis not present

## 2020-07-19 DIAGNOSIS — M79671 Pain in right foot: Secondary | ICD-10-CM | POA: Diagnosis not present

## 2020-07-19 DIAGNOSIS — M545 Low back pain, unspecified: Secondary | ICD-10-CM | POA: Diagnosis not present

## 2020-07-20 DIAGNOSIS — M6281 Muscle weakness (generalized): Secondary | ICD-10-CM | POA: Diagnosis not present

## 2020-07-20 DIAGNOSIS — Z20822 Contact with and (suspected) exposure to covid-19: Secondary | ICD-10-CM | POA: Diagnosis present

## 2020-07-20 DIAGNOSIS — Z820 Family history of epilepsy and other diseases of the nervous system: Secondary | ICD-10-CM | POA: Diagnosis not present

## 2020-07-20 DIAGNOSIS — Z7902 Long term (current) use of antithrombotics/antiplatelets: Secondary | ICD-10-CM | POA: Diagnosis not present

## 2020-07-20 DIAGNOSIS — M5410 Radiculopathy, site unspecified: Secondary | ICD-10-CM | POA: Diagnosis present

## 2020-07-20 DIAGNOSIS — R410 Disorientation, unspecified: Secondary | ICD-10-CM | POA: Diagnosis not present

## 2020-07-20 DIAGNOSIS — D631 Anemia in chronic kidney disease: Secondary | ICD-10-CM | POA: Diagnosis present

## 2020-07-20 DIAGNOSIS — F419 Anxiety disorder, unspecified: Secondary | ICD-10-CM | POA: Diagnosis not present

## 2020-07-20 DIAGNOSIS — G8929 Other chronic pain: Secondary | ICD-10-CM | POA: Diagnosis present

## 2020-07-20 DIAGNOSIS — G4733 Obstructive sleep apnea (adult) (pediatric): Secondary | ICD-10-CM | POA: Diagnosis not present

## 2020-07-20 DIAGNOSIS — Z743 Need for continuous supervision: Secondary | ICD-10-CM | POA: Diagnosis not present

## 2020-07-20 DIAGNOSIS — E785 Hyperlipidemia, unspecified: Secondary | ICD-10-CM | POA: Diagnosis not present

## 2020-07-20 DIAGNOSIS — K219 Gastro-esophageal reflux disease without esophagitis: Secondary | ICD-10-CM | POA: Diagnosis not present

## 2020-07-20 DIAGNOSIS — I5030 Unspecified diastolic (congestive) heart failure: Secondary | ICD-10-CM | POA: Diagnosis not present

## 2020-07-20 DIAGNOSIS — R279 Unspecified lack of coordination: Secondary | ICD-10-CM | POA: Diagnosis not present

## 2020-07-20 DIAGNOSIS — Z8673 Personal history of transient ischemic attack (TIA), and cerebral infarction without residual deficits: Secondary | ICD-10-CM | POA: Diagnosis not present

## 2020-07-20 DIAGNOSIS — Z794 Long term (current) use of insulin: Secondary | ICD-10-CM | POA: Diagnosis not present

## 2020-07-20 DIAGNOSIS — M545 Low back pain, unspecified: Secondary | ICD-10-CM | POA: Diagnosis not present

## 2020-07-20 DIAGNOSIS — I10 Essential (primary) hypertension: Secondary | ICD-10-CM | POA: Diagnosis not present

## 2020-07-20 DIAGNOSIS — D649 Anemia, unspecified: Secondary | ICD-10-CM | POA: Diagnosis not present

## 2020-07-20 DIAGNOSIS — I6930 Unspecified sequelae of cerebral infarction: Secondary | ICD-10-CM | POA: Diagnosis not present

## 2020-07-20 DIAGNOSIS — E1122 Type 2 diabetes mellitus with diabetic chronic kidney disease: Secondary | ICD-10-CM | POA: Diagnosis present

## 2020-07-20 DIAGNOSIS — M25571 Pain in right ankle and joints of right foot: Secondary | ICD-10-CM | POA: Diagnosis not present

## 2020-07-20 DIAGNOSIS — E119 Type 2 diabetes mellitus without complications: Secondary | ICD-10-CM | POA: Diagnosis not present

## 2020-07-20 DIAGNOSIS — F329 Major depressive disorder, single episode, unspecified: Secondary | ICD-10-CM | POA: Diagnosis not present

## 2020-07-20 DIAGNOSIS — Z7409 Other reduced mobility: Secondary | ICD-10-CM | POA: Diagnosis not present

## 2020-07-20 DIAGNOSIS — M549 Dorsalgia, unspecified: Secondary | ICD-10-CM | POA: Diagnosis not present

## 2020-07-20 DIAGNOSIS — M5459 Other low back pain: Secondary | ICD-10-CM | POA: Diagnosis not present

## 2020-07-20 DIAGNOSIS — I959 Hypotension, unspecified: Secondary | ICD-10-CM | POA: Diagnosis not present

## 2020-07-20 DIAGNOSIS — R41841 Cognitive communication deficit: Secondary | ICD-10-CM | POA: Diagnosis not present

## 2020-07-20 DIAGNOSIS — Z806 Family history of leukemia: Secondary | ICD-10-CM | POA: Diagnosis not present

## 2020-07-20 DIAGNOSIS — E559 Vitamin D deficiency, unspecified: Secondary | ICD-10-CM | POA: Diagnosis not present

## 2020-07-20 DIAGNOSIS — R278 Other lack of coordination: Secondary | ICD-10-CM | POA: Diagnosis not present

## 2020-07-20 DIAGNOSIS — D509 Iron deficiency anemia, unspecified: Secondary | ICD-10-CM | POA: Diagnosis present

## 2020-07-20 DIAGNOSIS — Z8249 Family history of ischemic heart disease and other diseases of the circulatory system: Secondary | ICD-10-CM | POA: Diagnosis not present

## 2020-07-20 DIAGNOSIS — I5022 Chronic systolic (congestive) heart failure: Secondary | ICD-10-CM | POA: Diagnosis not present

## 2020-07-20 DIAGNOSIS — J45909 Unspecified asthma, uncomplicated: Secondary | ICD-10-CM | POA: Diagnosis not present

## 2020-07-20 DIAGNOSIS — I739 Peripheral vascular disease, unspecified: Secondary | ICD-10-CM | POA: Diagnosis not present

## 2020-07-20 DIAGNOSIS — I130 Hypertensive heart and chronic kidney disease with heart failure and stage 1 through stage 4 chronic kidney disease, or unspecified chronic kidney disease: Secondary | ICD-10-CM | POA: Diagnosis present

## 2020-07-20 DIAGNOSIS — J449 Chronic obstructive pulmonary disease, unspecified: Secondary | ICD-10-CM | POA: Diagnosis present

## 2020-07-20 DIAGNOSIS — W19XXXD Unspecified fall, subsequent encounter: Secondary | ICD-10-CM | POA: Diagnosis not present

## 2020-07-20 DIAGNOSIS — I69320 Aphasia following cerebral infarction: Secondary | ICD-10-CM | POA: Diagnosis not present

## 2020-07-20 DIAGNOSIS — R2681 Unsteadiness on feet: Secondary | ICD-10-CM | POA: Diagnosis not present

## 2020-07-20 DIAGNOSIS — M109 Gout, unspecified: Secondary | ICD-10-CM | POA: Diagnosis not present

## 2020-07-20 DIAGNOSIS — N183 Chronic kidney disease, stage 3 unspecified: Secondary | ICD-10-CM | POA: Diagnosis present

## 2020-07-20 DIAGNOSIS — I5042 Chronic combined systolic (congestive) and diastolic (congestive) heart failure: Secondary | ICD-10-CM | POA: Diagnosis present

## 2020-07-20 DIAGNOSIS — D696 Thrombocytopenia, unspecified: Secondary | ICD-10-CM | POA: Diagnosis present

## 2020-07-22 ENCOUNTER — Telehealth: Payer: Self-pay | Admitting: Family Medicine

## 2020-07-22 ENCOUNTER — Other Ambulatory Visit: Payer: Self-pay

## 2020-07-22 DIAGNOSIS — E1151 Type 2 diabetes mellitus with diabetic peripheral angiopathy without gangrene: Secondary | ICD-10-CM | POA: Diagnosis not present

## 2020-07-22 DIAGNOSIS — G8929 Other chronic pain: Secondary | ICD-10-CM | POA: Diagnosis not present

## 2020-07-22 DIAGNOSIS — I739 Peripheral vascular disease, unspecified: Secondary | ICD-10-CM | POA: Diagnosis not present

## 2020-07-22 DIAGNOSIS — E785 Hyperlipidemia, unspecified: Secondary | ICD-10-CM | POA: Diagnosis not present

## 2020-07-22 DIAGNOSIS — I639 Cerebral infarction, unspecified: Secondary | ICD-10-CM | POA: Diagnosis not present

## 2020-07-22 DIAGNOSIS — Z7409 Other reduced mobility: Secondary | ICD-10-CM | POA: Diagnosis not present

## 2020-07-22 DIAGNOSIS — I5032 Chronic diastolic (congestive) heart failure: Secondary | ICD-10-CM | POA: Diagnosis not present

## 2020-07-22 DIAGNOSIS — Z8673 Personal history of transient ischemic attack (TIA), and cerebral infarction without residual deficits: Secondary | ICD-10-CM | POA: Diagnosis not present

## 2020-07-22 DIAGNOSIS — Z743 Need for continuous supervision: Secondary | ICD-10-CM | POA: Diagnosis not present

## 2020-07-22 DIAGNOSIS — M170 Bilateral primary osteoarthritis of knee: Secondary | ICD-10-CM | POA: Diagnosis not present

## 2020-07-22 DIAGNOSIS — M5416 Radiculopathy, lumbar region: Secondary | ICD-10-CM

## 2020-07-22 DIAGNOSIS — M25561 Pain in right knee: Secondary | ICD-10-CM | POA: Diagnosis not present

## 2020-07-22 DIAGNOSIS — I6930 Unspecified sequelae of cerebral infarction: Secondary | ICD-10-CM | POA: Diagnosis not present

## 2020-07-22 DIAGNOSIS — R2681 Unsteadiness on feet: Secondary | ICD-10-CM | POA: Diagnosis not present

## 2020-07-22 DIAGNOSIS — E119 Type 2 diabetes mellitus without complications: Secondary | ICD-10-CM | POA: Diagnosis not present

## 2020-07-22 DIAGNOSIS — I69320 Aphasia following cerebral infarction: Secondary | ICD-10-CM | POA: Diagnosis not present

## 2020-07-22 DIAGNOSIS — D649 Anemia, unspecified: Secondary | ICD-10-CM | POA: Diagnosis not present

## 2020-07-22 DIAGNOSIS — M5441 Lumbago with sciatica, right side: Secondary | ICD-10-CM | POA: Diagnosis not present

## 2020-07-22 DIAGNOSIS — W19XXXD Unspecified fall, subsequent encounter: Secondary | ICD-10-CM | POA: Diagnosis not present

## 2020-07-22 DIAGNOSIS — R410 Disorientation, unspecified: Secondary | ICD-10-CM | POA: Diagnosis not present

## 2020-07-22 DIAGNOSIS — M6281 Muscle weakness (generalized): Secondary | ICD-10-CM | POA: Diagnosis not present

## 2020-07-22 DIAGNOSIS — E559 Vitamin D deficiency, unspecified: Secondary | ICD-10-CM | POA: Diagnosis not present

## 2020-07-22 DIAGNOSIS — M109 Gout, unspecified: Secondary | ICD-10-CM | POA: Diagnosis not present

## 2020-07-22 DIAGNOSIS — G4733 Obstructive sleep apnea (adult) (pediatric): Secondary | ICD-10-CM | POA: Diagnosis not present

## 2020-07-22 DIAGNOSIS — K219 Gastro-esophageal reflux disease without esophagitis: Secondary | ICD-10-CM | POA: Diagnosis not present

## 2020-07-22 DIAGNOSIS — I10 Essential (primary) hypertension: Secondary | ICD-10-CM | POA: Diagnosis not present

## 2020-07-22 DIAGNOSIS — I959 Hypotension, unspecified: Secondary | ICD-10-CM | POA: Diagnosis not present

## 2020-07-22 DIAGNOSIS — M25562 Pain in left knee: Secondary | ICD-10-CM | POA: Diagnosis not present

## 2020-07-22 DIAGNOSIS — M5459 Other low back pain: Secondary | ICD-10-CM | POA: Diagnosis not present

## 2020-07-22 DIAGNOSIS — R41841 Cognitive communication deficit: Secondary | ICD-10-CM | POA: Diagnosis not present

## 2020-07-22 DIAGNOSIS — J45909 Unspecified asthma, uncomplicated: Secondary | ICD-10-CM | POA: Diagnosis not present

## 2020-07-22 DIAGNOSIS — F419 Anxiety disorder, unspecified: Secondary | ICD-10-CM | POA: Diagnosis not present

## 2020-07-22 DIAGNOSIS — R278 Other lack of coordination: Secondary | ICD-10-CM | POA: Diagnosis not present

## 2020-07-22 DIAGNOSIS — M545 Low back pain, unspecified: Secondary | ICD-10-CM | POA: Diagnosis not present

## 2020-07-22 DIAGNOSIS — I5030 Unspecified diastolic (congestive) heart failure: Secondary | ICD-10-CM | POA: Diagnosis not present

## 2020-07-22 DIAGNOSIS — N183 Chronic kidney disease, stage 3 unspecified: Secondary | ICD-10-CM | POA: Diagnosis not present

## 2020-07-22 DIAGNOSIS — R392 Extrarenal uremia: Secondary | ICD-10-CM | POA: Diagnosis not present

## 2020-07-22 DIAGNOSIS — F329 Major depressive disorder, single episode, unspecified: Secondary | ICD-10-CM | POA: Diagnosis not present

## 2020-07-22 DIAGNOSIS — I5022 Chronic systolic (congestive) heart failure: Secondary | ICD-10-CM | POA: Diagnosis not present

## 2020-07-22 DIAGNOSIS — D696 Thrombocytopenia, unspecified: Secondary | ICD-10-CM | POA: Diagnosis not present

## 2020-07-22 DIAGNOSIS — R0609 Other forms of dyspnea: Secondary | ICD-10-CM | POA: Diagnosis not present

## 2020-07-22 DIAGNOSIS — K5900 Constipation, unspecified: Secondary | ICD-10-CM | POA: Diagnosis not present

## 2020-07-22 DIAGNOSIS — R279 Unspecified lack of coordination: Secondary | ICD-10-CM | POA: Diagnosis not present

## 2020-07-22 DIAGNOSIS — M47817 Spondylosis without myelopathy or radiculopathy, lumbosacral region: Secondary | ICD-10-CM | POA: Diagnosis not present

## 2020-07-22 NOTE — Telephone Encounter (Signed)
We can order another one but it sounds like she is in inpatient rehab correct? If so they could talk about doing it there, otherwise it has to wait until she is home again

## 2020-07-22 NOTE — Telephone Encounter (Signed)
Spoke with daughter. Order placed and they will contact [facility] to schedule.

## 2020-07-22 NOTE — Telephone Encounter (Signed)
Patient's daughter called stating that the patient fell Friday night. She did not have any noted injuries but she is currently in the hospital and is getting ready to be transferred to rehab. She is still in a lot of pain and they are trying to manage it in the hospital but she thinks it might be best for the patient to have another Epidural. She asked if this could be ordered for her?   Please advise.

## 2020-07-23 ENCOUNTER — Other Ambulatory Visit: Payer: Self-pay | Admitting: Family Medicine

## 2020-07-25 ENCOUNTER — Other Ambulatory Visit: Payer: Self-pay | Admitting: *Deleted

## 2020-07-25 NOTE — Patient Outreach (Signed)
Member screened for potential [facility] care coordination needs.   Erin [name] resides in [facility]. Communication sent to facility SW to inquire about transition plans. Noted Mrs. Cino's PCP has [facility] embedded care coordination care management team.   Will continue to follow while member resides in SNF.   Marthenia Rolling, MSN, RN,BSN [place] Acute Care Coordinator [phone] [facility]) [phone]  (Toll free office)

## 2020-07-27 DIAGNOSIS — R2681 Unsteadiness on feet: Secondary | ICD-10-CM | POA: Diagnosis not present

## 2020-07-27 DIAGNOSIS — K5900 Constipation, unspecified: Secondary | ICD-10-CM | POA: Diagnosis not present

## 2020-07-27 DIAGNOSIS — R0609 Other forms of dyspnea: Secondary | ICD-10-CM | POA: Diagnosis not present

## 2020-07-27 DIAGNOSIS — I5032 Chronic diastolic (congestive) heart failure: Secondary | ICD-10-CM | POA: Diagnosis not present

## 2020-07-27 DIAGNOSIS — M5441 Lumbago with sciatica, right side: Secondary | ICD-10-CM | POA: Diagnosis not present

## 2020-07-27 DIAGNOSIS — E1151 Type 2 diabetes mellitus with diabetic peripheral angiopathy without gangrene: Secondary | ICD-10-CM | POA: Diagnosis not present

## 2020-07-28 ENCOUNTER — Telehealth: Payer: Self-pay | Admitting: Family Medicine

## 2020-07-28 ENCOUNTER — Other Ambulatory Visit: Payer: Self-pay | Admitting: *Deleted

## 2020-07-28 ENCOUNTER — Ambulatory Visit (INDEPENDENT_AMBULATORY_CARE_PROVIDER_SITE_OTHER): Payer: Medicare Other

## 2020-07-28 DIAGNOSIS — I639 Cerebral infarction, unspecified: Secondary | ICD-10-CM

## 2020-07-28 NOTE — Patient Outreach (Signed)
[place] Coordinator follow up. Member screened for potential [facility] Care Management needs.  Mrs. Dudek resides in [facility]. Update received from [facility] SW indicating member will have careplan meeting likely this week. Will know more about transition plans at that time.   Writer will continue to follow while member resides in SNF.   Marthenia Rolling, MSN, RN,BSN [place] Acute Care Coordinator [phone] [facility]) [phone]  (Toll free office)

## 2020-07-28 NOTE — Telephone Encounter (Addendum)
I am asked to sign off on her stopping plavix for 5 days for epidural steroid injetion.  Per recent hospital discharge summary (admit 4/15 per [facility]) she was already instructed to stop plavix 5 days prior to injection.  We calledpt  and she has already held her plavix for 3 days.  May continue to hold for planned injection this week  JC  Per DC summary: During admission patient continued to request for back pain that was shooting downwards bilateral lower legs right worse than left extremity. She was started on lidocaine patches, Norco, Toradol but continued to have pain. [facility] was consulted for epidural injection but per orthopedic recommendation: My recommendation is to switch gabapentin 100 mg to Lyrica 75 mg at night. Patient could benefit from a right S1 SNR B. However she is on Plavix and she will need to stop this for about 5 days in order to have the epidural injection. This can be done as an outpatient. Patient pain was controlled with oral pain medications.

## 2020-07-29 LAB — CUP PACEART REMOTE DEVICE CHECK
Date Time Interrogation Session: 20220425010109
Implantable Pulse Generator Implant Date: 20190429

## 2020-07-31 ENCOUNTER — Ambulatory Visit
Admission: RE | Admit: 2020-07-31 | Discharge: 2020-07-31 | Disposition: A | Discharge status: Home / Self Care | Payer: Medicare Other | Source: Ambulatory Visit | Attending: Family Medicine | Admitting: Family Medicine

## 2020-07-31 ENCOUNTER — Other Ambulatory Visit: Payer: Self-pay

## 2020-07-31 ENCOUNTER — Ambulatory Visit: Payer: Medicare Other | Admitting: Family Medicine

## 2020-07-31 DIAGNOSIS — M47817 Spondylosis without myelopathy or radiculopathy, lumbosacral region: Secondary | ICD-10-CM | POA: Diagnosis not present

## 2020-07-31 DIAGNOSIS — M5416 Radiculopathy, lumbar region: Secondary | ICD-10-CM

## 2020-07-31 MED ORDER — IOPAMIDOL (ISOVUE-M 200) INJECTION 41%
1.0000 mL | Freq: Once | INTRAMUSCULAR | Status: AC
  Administered 2020-07-31: 1 mL via EPIDURAL

## 2020-07-31 MED ORDER — METHYLPREDNISOLONE ACETATE 40 MG/ML INJ SUSP (RADIOLOG
80.0000 mg | Freq: Once | INTRAMUSCULAR | Status: AC
  Administered 2020-07-31: 80 mg via EPIDURAL

## 2020-07-31 NOTE — Discharge Instructions (Signed)
Post Procedure Spinal Discharge Instruction Sheet  1. You may resume a regular diet and any medications that you routinely take (including pain medications).  2. No driving day of procedure.  3. Light activity throughout the rest of the day.  Do not do any strenuous work, exercise, bending or lifting.  The day following the procedure, you can resume normal physical activity but you should refrain from exercising or physical therapy for at least three days thereafter.   Common Side Effects:   Headaches- take your usual medications as directed by your physician.  Increase your fluid intake.  Caffeinated beverages may be helpful.  Lie flat in bed until your headache resolves.   Restlessness or inability to sleep- you may have trouble sleeping for the next few days.  Ask your referring physician if you need any medication for sleep.   Facial flushing or redness- should subside within a few days.   Increased pain- a temporary increase in pain a day or two following your procedure is not unusual.  Take your pain medication as prescribed by your referring physician.   Leg cramps  Please contact our office at [phone] for the following symptoms:  Fever greater than 100 degrees.  Headaches unresolved with medication after 2-3 days.  Increased swelling, pain, or redness at injection site.  YOU MAY RESUME YOUR PLAVIX RIGHT AFTER PROCEDURE TODAY 07/31/20

## 2020-08-05 ENCOUNTER — Other Ambulatory Visit: Payer: Self-pay | Admitting: *Deleted

## 2020-08-05 ENCOUNTER — Other Ambulatory Visit: Payer: Self-pay | Admitting: Family Medicine

## 2020-08-05 NOTE — Patient Outreach (Signed)
[facility] Post- Acute Care Coordinator follow up. Mrs. Schewe resides in [facility]. Member screened for care coordination needs.  Communication sent to facility SW to inquire about transition plans.   Noted PCP with [facility] [place] [facility] has [facility] embedded care coordination team in the practice.   Will continue to follow while member resides in SNF. If appropriate, will make referral to [facility] embedded team post SNF.   Marthenia Rolling, MSN, RN,BSN [place] Acute Care Coordinator [phone] [facility]) [phone]  (Toll free office)

## 2020-08-09 DIAGNOSIS — R392 Extrarenal uremia: Secondary | ICD-10-CM | POA: Diagnosis not present

## 2020-08-09 DIAGNOSIS — D696 Thrombocytopenia, unspecified: Secondary | ICD-10-CM | POA: Diagnosis not present

## 2020-08-09 DIAGNOSIS — E119 Type 2 diabetes mellitus without complications: Secondary | ICD-10-CM | POA: Diagnosis not present

## 2020-08-11 ENCOUNTER — Other Ambulatory Visit: Payer: Self-pay | Admitting: *Deleted

## 2020-08-11 NOTE — Patient Outreach (Signed)
[facility] Post- Acute Care Coordinator follow up. Mrs. Jons resides in [facility]. Member screened for potential [facility] care coordination needs.   Update received from SNF SW indicating Mrs. Vanleeuwen will return home with daughter. States 24/7 care is recommended. States in home care company list was provided to family. Suggests writer contact daughter to discuss [facility] follow up.   Telephone call made to Markeda Narvaez [phone]. Patient identifiers confirmed. Marcie Bal states she was told member was placed on iv fluids for dehydration. States she is not sure exact date of transition this week to home from SNF.  Writer explained that Dr. Nonda Lou office has an embedded care coordination/care management team. Marcie Bal states she has follow up appointment scheduled with PCP in 2 weeks for Mrs. [name]. Marcie Bal agreeable to Probation officer emailing her [facility] website link that gives brief description of embedded care coordination services.   Marcie Bal states she has been making calls to find private duty care for member. She also has questions about the cost of a hospital bed. Writer to make SNF SW aware of Janet's inquiry about hospital bed.   Marcie Bal agreeable to Probation officer contacting her upon SNF transition to see if referral is needed to [facility] embedded care coordination team.   Marthenia Rolling, MSN, RN,BSN [place] Acute Care Coordinator [phone] [facility]) [phone]  (Toll free office)

## 2020-08-12 ENCOUNTER — Ambulatory Visit: Payer: Medicare Other | Admitting: Family Medicine

## 2020-08-13 NOTE — Progress Notes (Signed)
Carelink Summary Report / Loop Recorder 

## 2020-08-14 ENCOUNTER — Other Ambulatory Visit: Payer: Self-pay | Admitting: Family Medicine

## 2020-08-14 ENCOUNTER — Other Ambulatory Visit: Payer: Self-pay | Admitting: *Deleted

## 2020-08-14 NOTE — Patient Outreach (Signed)
[place] Coordinator follow up. Member screened for potential [facility] care coordination needs.   Update received from [facility] SW indicating Mrs. Chalupa transition date/NOMNC was rescinded. States she will [place] on new date when known. SNF SW Ebony Hail) also states she cross checked with multiple sources that Mrs. Eckstrom will not qualify for a hospital bed. States she will update daughter about this.   Will continue to follow while member resides in SNF. Will outreach daughter again at later time to discuss potential need for care coordination needs with [facility] embedded team at PCP office.     Marthenia Rolling, MSN, RN,BSN [place] Acute Care Coordinator [phone] [facility]) [phone]  (Toll free office)

## 2020-08-15 ENCOUNTER — Ambulatory Visit (INDEPENDENT_AMBULATORY_CARE_PROVIDER_SITE_OTHER): Payer: Medicare Other | Admitting: Family Medicine

## 2020-08-15 ENCOUNTER — Encounter: Payer: Self-pay | Admitting: Family Medicine

## 2020-08-15 ENCOUNTER — Ambulatory Visit: Payer: Self-pay

## 2020-08-15 ENCOUNTER — Other Ambulatory Visit: Payer: Self-pay

## 2020-08-15 VITALS — BP 138/62 | HR 71 | Ht 62.0 in | Wt 224.0 lb

## 2020-08-15 DIAGNOSIS — M170 Bilateral primary osteoarthritis of knee: Secondary | ICD-10-CM

## 2020-08-15 DIAGNOSIS — I639 Cerebral infarction, unspecified: Secondary | ICD-10-CM | POA: Diagnosis not present

## 2020-08-15 DIAGNOSIS — M25562 Pain in left knee: Secondary | ICD-10-CM | POA: Diagnosis not present

## 2020-08-15 DIAGNOSIS — M25561 Pain in right knee: Secondary | ICD-10-CM

## 2020-08-15 DIAGNOSIS — G8929 Other chronic pain: Secondary | ICD-10-CM

## 2020-08-15 NOTE — Progress Notes (Signed)
I, Erin [name], LAT, ATC, am serving as scribe for Dr. Lynne Leader.  Erin [name] is a 75 y.o. female who presents to [place] at [facility] today for chronic bilat knee pain. Pt was previously seen by Dr. [name] on 05/22/20 and was given bilat knee steroid injections and was referred for a lumbar epidural steroid injection (L L5-S1) that was completed on 07/31/20. Today, pt reports that she fell about a month ago which led to her going to the ED.  She was then referred to [facility] where she is receiving PT.  Her pain has been increased since this past Monday w/ no new injury noted.  She is taking hydrocodone q 4 hours and her Gabapentin has been increased to 300 mg at bedtime. . She has had significant setbacks recently with her fall and is effectively unable to walk and is living in a nursing home now.  Dx imaging: 04/09/19 L-spine MRI  02/13/19 L-spine XR  11/29/18 L-spine XR  12/04/15 R knee XR & bilat LE venous doppler  Pertinent review of systems: No fevers or chills  Relevant historical information: Diabetes, heart failure, COPD, aphasia   Exam:  BP 138/62 (BP Location: Left Arm, Patient Position: Sitting, Cuff Size: Large)   Pulse 71   Ht 5\' 2"  (1.575 m)   Wt 224 lb (101.6 kg)   SpO2 96%   BMI 40.97 kg/m  General: Well Developed, well nourished, and in no acute distress.   MSK: Bilateral knees significantly reduced range of motion with crepitation.  Tender palpation diffusely.  Moderate effusion bilaterally.    Lab and Radiology Results  Procedure: Real-time Ultrasound Guided Injection of right knee superior lateral patellar space Device: Philips Affiniti 50G Images permanently stored and available for review in PACS Verbal informed consent obtained.  Discussed risks and benefits of procedure. Warned about infection bleeding damage to structures skin hypopigmentation and fat atrophy among others. Patient expresses understanding and  agreement Time-out conducted.   Noted no overlying erythema, induration, or other signs of local infection.   Skin prepped in a sterile fashion.   Local anesthesia: Topical Ethyl chloride.   With sterile technique and under real time ultrasound guidance:  40 mg of Kenalog and 2 mL of Marcaine injected into knee joint. Fluid seen entering the joint capsule.   Completed without difficulty   Pain immediately resolved suggesting accurate placement of the medication.   Advised to call if fevers/chills, erythema, induration, drainage, or persistent bleeding.   Images permanently stored and available for review in the ultrasound unit.  Impression: Technically successful ultrasound guided injection.   Procedure: Real-time Ultrasound Guided Injection of left knee superior lateral patellar space Device: Philips Affiniti 50G Images permanently stored and available for review in PACS Verbal informed consent obtained.  Discussed risks and benefits of procedure. Warned about infection bleeding damage to structures skin hypopigmentation and fat atrophy among others. Patient expresses understanding and agreement Time-out conducted.   Noted no overlying erythema, induration, or other signs of local infection.   Skin prepped in a sterile fashion.   Local anesthesia: Topical Ethyl chloride.   With sterile technique and under real time ultrasound guidance:  40 mg of Kenalog and 2 mL of Marcaine injected into knee joint. Fluid seen entering the joint capsule.   Completed without difficulty   Pain immediately resolved suggesting accurate placement of the medication.   Advised to call if fevers/chills, erythema, induration, drainage, or persistent bleeding.   Images  permanently stored and available for review in the ultrasound unit.  Impression: Technically successful ultrasound guided injection.       Assessment and Plan: 75 y.o. female with bilateral knee pain.  Patient has severe knee arthritis  unfortunately there are no other good options.  She is a poor surgical candidate given her poor health and poor rehab potential.  She has had trials of hyaluronic acid injections in the past which did not help.  At this point we are proceeding with steroid injections every 3 months or so in the hopes that this can provide some pain relief.  Unfortunately in the last 3 months she has had a fall and a significant setback.  She effectively has become somewhat wheelchair-bound and now resides in a nursing home.  The goal is to receive physical therapy services and get her walking and independent again.  However based on her presentation in clinic today I am concerned.  Plan to continue PT services in the nursing home and proceed with steroid injection.  She may benefit from a family meeting to discuss goals of care.  She seems to be doing quite poorly. [name] in 3 months with myself or Dr. [name] for repeat steroid injections.  Return sooner if needed.   PDMP not reviewed this encounter. Orders Placed This Encounter  Procedures  . [place] LIMITED JOINT SPACE STRUCTURES LOW BILAT(NO LINKED CHARGES)    Order Specific Question:   Reason for Exam (SYMPTOM  OR DIAGNOSIS REQUIRED)    Answer:   B knee pain    Order Specific Question:   Preferred imaging location?    Answer:   [place]   No orders of the defined types were placed in this encounter.    Discussed warning signs or symptoms. Please see discharge instructions. Patient expresses understanding.   The above documentation has been reviewed and is accurate and complete Lynne Leader, M.D.

## 2020-08-15 NOTE — Patient Instructions (Addendum)
Good to meet you today.  You had B knee injections today.  Call or go to the ER if you develop a large red swollen joint with extreme pain or oozing puss.   Schedule follow up appointment with Dr [name] in 3 months for repeat injections.   Return sooner if needed.   The knees are wearing out.

## 2020-08-24 DIAGNOSIS — E1151 Type 2 diabetes mellitus with diabetic peripheral angiopathy without gangrene: Secondary | ICD-10-CM | POA: Diagnosis not present

## 2020-08-24 DIAGNOSIS — Z981 Arthrodesis status: Secondary | ICD-10-CM | POA: Diagnosis not present

## 2020-08-24 DIAGNOSIS — G4733 Obstructive sleep apnea (adult) (pediatric): Secondary | ICD-10-CM | POA: Diagnosis not present

## 2020-08-24 DIAGNOSIS — J45909 Unspecified asthma, uncomplicated: Secondary | ICD-10-CM | POA: Diagnosis not present

## 2020-08-24 DIAGNOSIS — M4726 Other spondylosis with radiculopathy, lumbar region: Secondary | ICD-10-CM | POA: Diagnosis not present

## 2020-08-24 DIAGNOSIS — M47812 Spondylosis without myelopathy or radiculopathy, cervical region: Secondary | ICD-10-CM | POA: Diagnosis not present

## 2020-08-24 DIAGNOSIS — D631 Anemia in chronic kidney disease: Secondary | ICD-10-CM | POA: Diagnosis not present

## 2020-08-24 DIAGNOSIS — F419 Anxiety disorder, unspecified: Secondary | ICD-10-CM | POA: Diagnosis not present

## 2020-08-24 DIAGNOSIS — E559 Vitamin D deficiency, unspecified: Secondary | ICD-10-CM | POA: Diagnosis not present

## 2020-08-24 DIAGNOSIS — G8929 Other chronic pain: Secondary | ICD-10-CM | POA: Diagnosis not present

## 2020-08-24 DIAGNOSIS — Z9181 History of falling: Secondary | ICD-10-CM | POA: Diagnosis not present

## 2020-08-24 DIAGNOSIS — K5900 Constipation, unspecified: Secondary | ICD-10-CM | POA: Diagnosis not present

## 2020-08-24 DIAGNOSIS — E1122 Type 2 diabetes mellitus with diabetic chronic kidney disease: Secondary | ICD-10-CM | POA: Diagnosis not present

## 2020-08-24 DIAGNOSIS — D696 Thrombocytopenia, unspecified: Secondary | ICD-10-CM | POA: Diagnosis not present

## 2020-08-24 DIAGNOSIS — R41841 Cognitive communication deficit: Secondary | ICD-10-CM | POA: Diagnosis not present

## 2020-08-24 DIAGNOSIS — K219 Gastro-esophageal reflux disease without esophagitis: Secondary | ICD-10-CM | POA: Diagnosis not present

## 2020-08-24 DIAGNOSIS — F329 Major depressive disorder, single episode, unspecified: Secondary | ICD-10-CM | POA: Diagnosis not present

## 2020-08-24 DIAGNOSIS — I130 Hypertensive heart and chronic kidney disease with heart failure and stage 1 through stage 4 chronic kidney disease, or unspecified chronic kidney disease: Secondary | ICD-10-CM | POA: Diagnosis not present

## 2020-08-24 DIAGNOSIS — I5042 Chronic combined systolic (congestive) and diastolic (congestive) heart failure: Secondary | ICD-10-CM | POA: Diagnosis not present

## 2020-08-24 DIAGNOSIS — I69320 Aphasia following cerebral infarction: Secondary | ICD-10-CM | POA: Diagnosis not present

## 2020-08-24 DIAGNOSIS — M1030 Gout due to renal impairment, unspecified site: Secondary | ICD-10-CM | POA: Diagnosis not present

## 2020-08-24 DIAGNOSIS — N183 Chronic kidney disease, stage 3 unspecified: Secondary | ICD-10-CM | POA: Diagnosis not present

## 2020-08-24 DIAGNOSIS — M5117 Intervertebral disc disorders with radiculopathy, lumbosacral region: Secondary | ICD-10-CM | POA: Diagnosis not present

## 2020-08-24 DIAGNOSIS — E785 Hyperlipidemia, unspecified: Secondary | ICD-10-CM | POA: Diagnosis not present

## 2020-08-25 ENCOUNTER — Other Ambulatory Visit: Payer: Self-pay | Admitting: *Deleted

## 2020-08-25 ENCOUNTER — Telehealth: Payer: Self-pay | Admitting: Family Medicine

## 2020-08-25 DIAGNOSIS — G8929 Other chronic pain: Secondary | ICD-10-CM | POA: Diagnosis not present

## 2020-08-25 DIAGNOSIS — M5117 Intervertebral disc disorders with radiculopathy, lumbosacral region: Secondary | ICD-10-CM | POA: Diagnosis not present

## 2020-08-25 DIAGNOSIS — M4726 Other spondylosis with radiculopathy, lumbar region: Secondary | ICD-10-CM | POA: Diagnosis not present

## 2020-08-25 DIAGNOSIS — I5042 Chronic combined systolic (congestive) and diastolic (congestive) heart failure: Secondary | ICD-10-CM | POA: Diagnosis not present

## 2020-08-25 DIAGNOSIS — E1122 Type 2 diabetes mellitus with diabetic chronic kidney disease: Secondary | ICD-10-CM | POA: Diagnosis not present

## 2020-08-25 DIAGNOSIS — I130 Hypertensive heart and chronic kidney disease with heart failure and stage 1 through stage 4 chronic kidney disease, or unspecified chronic kidney disease: Secondary | ICD-10-CM | POA: Diagnosis not present

## 2020-08-25 NOTE — Telephone Encounter (Signed)
Okay for orders? 

## 2020-08-25 NOTE — Telephone Encounter (Signed)
Ronalee Belts from [employer] [phone]   TP,OT Occupation therapy, Speech therapy

## 2020-08-25 NOTE — Telephone Encounter (Signed)
Message left on [facility] phone.

## 2020-08-25 NOTE — Patient Outreach (Signed)
[place] Coordinator follow up. Mrs. Furlan screened for potential [facility] care coordination needs.   Verified in [place] (PatientPing) member transitioned home over the weekend from [facility].   Telephone call made to Clarrisa Kaylor (DPR/daughter) [phone] (only number listed for member). Patient identifiers confirmed. Marcie Bal confirms Mrs. Oviatt transitioned over the weekend.   Marcie Bal states PCP appointment is scheduled for tomorrow. PCP practice has [facility] embedded care coordination team. Discussed potential [facility] care coordination needs. Marcie Bal states she will discuss with PCP if needed.    Marcie Bal also states [facility] home [facility] has started. States Mrs. Villafuerte has someone to stay with her a few hours daily for caregiver assistance. However, they are looking for someone else.  Marcie Bal confirms she has contact information and [facility] website link if needed. No further needs assessed.    Marthenia Rolling, MSN, RN,BSN [place] Acute Care Coordinator [phone] [facility]) [phone]  (Toll free office)

## 2020-08-26 ENCOUNTER — Encounter: Payer: Self-pay | Admitting: Family Medicine

## 2020-08-26 ENCOUNTER — Ambulatory Visit (INDEPENDENT_AMBULATORY_CARE_PROVIDER_SITE_OTHER): Payer: Medicare Other | Admitting: Family Medicine

## 2020-08-26 ENCOUNTER — Other Ambulatory Visit: Payer: Self-pay

## 2020-08-26 VITALS — BP 140/84 | HR 76 | Temp 98.7°F | Resp 20 | Ht 62.0 in | Wt 215.2 lb

## 2020-08-26 DIAGNOSIS — E785 Hyperlipidemia, unspecified: Secondary | ICD-10-CM | POA: Diagnosis not present

## 2020-08-26 DIAGNOSIS — D509 Iron deficiency anemia, unspecified: Secondary | ICD-10-CM | POA: Diagnosis not present

## 2020-08-26 DIAGNOSIS — M1A0790 Idiopathic chronic gout, unspecified ankle and foot, without tophus (tophi): Secondary | ICD-10-CM | POA: Diagnosis not present

## 2020-08-26 DIAGNOSIS — R296 Repeated falls: Secondary | ICD-10-CM | POA: Diagnosis not present

## 2020-08-26 DIAGNOSIS — I10 Essential (primary) hypertension: Secondary | ICD-10-CM

## 2020-08-26 DIAGNOSIS — E1165 Type 2 diabetes mellitus with hyperglycemia: Secondary | ICD-10-CM

## 2020-08-26 DIAGNOSIS — I639 Cerebral infarction, unspecified: Secondary | ICD-10-CM | POA: Diagnosis not present

## 2020-08-26 DIAGNOSIS — F0390 Unspecified dementia without behavioral disturbance: Secondary | ICD-10-CM | POA: Diagnosis not present

## 2020-08-26 MED ORDER — GABAPENTIN 300 MG PO CAPS: 300.0000 mg | ORAL_CAPSULE | Freq: Every day | ORAL | 3 refills | Status: DC

## 2020-08-26 MED ORDER — ALLOPURINOL 300 MG PO TABS: 300.0000 mg | ORAL_TABLET | Freq: Every day | ORAL | 3 refills | Status: DC

## 2020-08-26 NOTE — Progress Notes (Signed)
Subjective:   By signing my name below, I, Erin [name], attest that this documentation has been prepared under the direction and in the presence of Dr. Roma Schanz, DO. 08/26/2020      Patient ID: Erin [name], female    DOB: 06-Jul-1945, 75 y.o.   MRN: [id number]  Chief Complaint  Patient presents with  . Hypertension  . Hyperlipidemia  . Diabetes  . Follow-up    HPI Patient is in today for office visit. She is accompanied by her daughter to help assist during the visit. She was recently admitted to the ED for a fall. She was discharged on 08/23/2020. Her walking has improved since her visit to the ED. She is struggling to complete daily tasks at home by herself such as baths because she risks falling. Her rehab physician stopped her 75 mg Plavix daily PO prescription. Her urologist increased her allopurinol daily PO to 100 mg daily PO to manage her gout in both of her feet. Her rehab physician increased her gabapentin daily PO to 300 mg daily PO. She is also requesting a refill for 300 mg gabapentin daily PO. She has an upcoming appointment with her cardiologist on 09/02/2020. Her blood sugar at home was 145 and has improved.    Past Medical History:  Diagnosis Date  . Asthma   . CHF (congestive heart failure) [facility])    hospital 11/2013-- [place]  . Chronic kidney disease   . Depression   . Diabetes mellitus ([place])   . GERD (gastroesophageal reflux disease)   . Hyperlipidemia   . Hypertension   . Stroke [facility])     Past Surgical History:  Procedure Laterality Date  . CHOLECYSTECTOMY  2002  . LOOP RECORDER INSERTION N/A 08/01/2017   Procedure: LOOP RECORDER INSERTION;  Surgeon: Constance Haw, MD;  Location: [facility];  Service: Cardiovascular;  Laterality: N/A;  . SPINE SURGERY  aug 2015   [facility]  . TEE WITHOUT CARDIOVERSION N/A 08/01/2017   Procedure: TRANSESOPHAGEAL ECHOCARDIOGRAM (TEE);  Surgeon: Sanda Klein, MD;  Location: [facility]  ENDOSCOPY;  Service: Cardiovascular;  Laterality: N/A;  . TONSILLECTOMY    . TUBAL LIGATION      Family History  Problem Relation Age of Onset  . Hypertension Mother   . Alzheimer's disease Mother   . Hyperlipidemia Mother   . Heart disease Father        cad  . Asthma Father   . Cancer Father 61       leukemia  . Stroke Father   . Hypertension Father   . Heart disease Sister 48       MI  . Pulmonary fibrosis Sister   . Arthritis Brother   . Heart disease Maternal Uncle   . Stroke Maternal Uncle   . Uterine cancer Paternal Grandmother   . Stomach cancer Paternal Grandfather   . Heart disease Maternal Uncle   . Heart disease Maternal Uncle   . Cancer Maternal Aunt        leukemia    Social History   Socioeconomic History  . Marital status: Widowed    Spouse name: Not on file  . Number of children: 4  . Years of education: Not on file  . Highest education level: Not on file  Occupational History  . Occupation: [employer] @ [employer]: [employer]  Tobacco Use  . Smoking status: Former Smoker    Packs/day: 1.50    Years: 20.00  Pack years: 30.00    Types: Cigarettes    Quit date: 08/02/1984    Years since quitting: 36.0  . Smokeless tobacco: Never Used  Vaping Use  . Vaping Use: Never used  Substance and Sexual Activity  . Alcohol use: No  . Drug use: No  . Sexual activity: Not Currently    Partners: Male  Other Topics Concern  . Not on file  Social History Narrative   NO REG EXERCISE   Caffeine use: daily   Social Determinants of Health   Financial Resource Strain: Not on file  Food Insecurity: Not on file  Transportation Needs: Not on file  Physical Activity: Not on file  Stress: Not on file  Social Connections: Not on file  Intimate Partner Violence: Not on file    Outpatient Medications Prior to Visit  Medication Sig Dispense Refill  . Acetaminophen (TYLENOL 8 HOUR PO) Take 500 mg by mouth. (Patient not taking: No sig reported)     . albuterol (PROVENTIL) (2.5 MG/3ML) 0.083% nebulizer solution Take 3 mLs (2.5 mg total) by nebulization every 6 (six) hours as needed for wheezing or shortness of breath. 75 mL 12  . albuterol (VENTOLIN HFA) 108 (90 Base) MCG/ACT inhaler Inhale 2 puffs into the lungs every 4 (four) hours as needed for wheezing or shortness of breath.     . AMBULATORY NON FORMULARY MEDICATION Rollator 1 Units 0  . atorvastatin (LIPITOR) 40 MG tablet TAKE 1 TABLET (40 MG TOTAL) BY MOUTH DAILY. PT NEEDS OV FOR FURTHER REFILLS 30 tablet 0  . BD PEN NEEDLE NANO 2ND GEN 32G X 4 MM MISC USE 4 TIMES A DAY 300 each 3  . busPIRone (BUSPAR) 15 MG tablet Take 1 tablet (15 mg total) by mouth 3 (three) times daily. Pt needs OV for further refills 270 tablet 0  . carvedilol (COREG) 6.25 MG tablet TAKE 1.5 TABLETS (9.375 MG TOTAL) BY MOUTH 2 (TWO) TIMES DAILY WITH A MEAL. 170 tablet 1  . clopidogrel (PLAVIX) 75 MG tablet Take 1 tablet (75 mg total) by mouth daily. 30 tablet 3  . diclofenac sodium (VOLTAREN) 1 % GEL Apply 4 g topically 4 (four) times daily. 100 g 2  . EPINEPHrine 0.3 mg/0.3 mL IJ SOAJ injection PLEASE SEE ATTACHED FOR DETAILED DIRECTIONS    . ergocalciferol (VITAMIN D2) 1.25 MG (50000 UT) capsule Take 1 capsule (50,000 Units total) by mouth once a week. Wednesday 12 capsule 1  . fenofibrate micronized (LOFIBRA) 200 MG capsule Take 1 capsule (200 mg total) by mouth daily. Pt need OV for further refills 30 capsule 0  . ferrous sulfate 325 (65 FE) MG tablet Take 325 mg by mouth.    . Fluticasone-Salmeterol (ADVAIR) 250-50 MCG/DOSE AEPB Inhale 1 puff into the lungs 2 (two) times daily.    Marland Kitchen glucose blood test strip Use 3x a day - One TOUch Ultra 300 each 3  . HYDROcodone-acetaminophen (NORCO/VICODIN) 5-325 MG tablet Take 1 tablet by mouth every 4 (four) hours as needed for moderate pain.    . Insulin Glargine (BASAGLAR KWIKPEN) 100 UNIT/ML INJECT 0.4 MLS (40 UNITS TOTAL) INTO THE SKIN AT BEDTIME. 45 pen 0  .  pantoprazole (PROTONIX) 40 MG tablet Take 1 tablet (40 mg total) by mouth daily. 30 tablet 3  . pramipexole (MIRAPEX) 1 MG tablet Take 1 tablet (1 mg total) by mouth at bedtime. 30 tablet 3  . sertraline (ZOLOFT) 50 MG tablet TAKE 1 AND 1/2 TABLETS DAILY BY MOUTH 135  tablet 1  . torsemide (DEMADEX) 20 MG tablet TAKE FOUR TABLETS BY MOUTH TWICE DAILY    . allopurinol (ZYLOPRIM) 100 MG tablet Take 1 tablet (100 mg total) by mouth daily. 30 tablet 3  . gabapentin (NEURONTIN) 100 MG capsule TAKE 1 CAPSULE BY MOUTH AT BEDTIME. 90 capsule 1   No facility-administered medications prior to visit.    Allergies  Allergen Reactions  . Bee Venom     Review of Systems  Constitutional: Negative for chills, fever and malaise/fatigue.  HENT: Negative for congestion and hearing loss.   Eyes: Negative for discharge.  Respiratory: Negative for cough, sputum production and shortness of breath.   Cardiovascular: Negative for chest pain, palpitations and leg swelling.  Gastrointestinal: Negative for abdominal pain, blood in stool, constipation, diarrhea, heartburn, nausea and vomiting.  Genitourinary: Negative for dysuria, frequency, hematuria and urgency.  Musculoskeletal: Negative for back pain, falls and myalgias.  Skin: Negative for rash.  Neurological: Positive for weakness. Negative for dizziness, sensory change, loss of consciousness and headaches.  Endo/Heme/Allergies: Negative for environmental allergies. Does not bruise/bleed easily.  Psychiatric/Behavioral: Negative for depression and suicidal ideas. The patient is not nervous/anxious and does not have insomnia.        Objective:    Physical Exam Constitutional:      General: She is not in acute distress.    Appearance: Normal appearance. She is not ill-appearing.  HENT:     Head: Normocephalic and atraumatic.     Right Ear: External ear normal.     Left Ear: External ear normal.  Eyes:     Extraocular Movements: Extraocular movements  intact.     Pupils: Pupils are equal, round, and reactive to light.  Cardiovascular:     Rate and Rhythm: Normal rate and regular rhythm.     Pulses: Normal pulses.     Heart sounds: Normal heart sounds. No murmur heard. No gallop.   Pulmonary:     Effort: Pulmonary effort is normal. No respiratory distress.     Breath sounds: Normal breath sounds. No wheezing, rhonchi or rales.  Skin:    General: Skin is warm and dry.  Neurological:     Mental Status: She is alert and oriented to person, place, and time.  Psychiatric:        Behavior: Behavior normal.     BP 140/84 (BP Location: Left Arm, Patient Position: Sitting, Cuff Size: Normal)   Pulse 76   Temp 98.7 F (37.1 C) (Oral)   Resp 20   Ht 5\' 2"  (1.575 m)   Wt 215 lb 3.2 oz (97.6 kg)   SpO2 98%   BMI 39.36 kg/m  Wt Readings from Last 3 Encounters:  08/26/20 215 lb 3.2 oz (97.6 kg)  08/15/20 224 lb (101.6 kg)  05/22/20 224 lb (101.6 kg)    Diabetic Foot Exam - Simple   No data filed    Lab Results  Component Value Date   WBC 5.3 04/03/2020   HGB 8.8 (A) 04/03/2020   HCT 27 (A) 04/03/2020   PLT 73 (A) 04/03/2020   GLUCOSE 133 (H) 03/05/2020   CHOL 122 07/24/2019   TRIG 147.0 07/24/2019   HDL 41.30 07/24/2019   LDLDIRECT 54.0 07/12/2017   LDLCALC 51 07/24/2019   ALT 25 04/03/2020   AST 36 (A) 04/03/2020   NA 139 04/03/2020   K 4.1 04/03/2020   CL 105 04/03/2020   CREATININE 1.5 (A) 04/03/2020   BUN 66 (A) 04/03/2020   CO2  24 (A) 04/03/2020   TSH 2.29 05/17/2011   HGBA1C 6.7 (H) 03/05/2020   MICROALBUR 6.7 (H) 07/24/2019    Lab Results  Component Value Date   TSH 2.29 05/17/2011   Lab Results  Component Value Date   WBC 5.3 04/03/2020   HGB 8.8 (A) 04/03/2020   HCT 27 (A) 04/03/2020   MCV 87.9 03/05/2020   PLT 73 (A) 04/03/2020   Lab Results  Component Value Date   NA 139 04/03/2020   K 4.1 04/03/2020   CO2 24 (A) 04/03/2020   GLUCOSE 133 (H) 03/05/2020   BUN 66 (A) 04/03/2020    CREATININE 1.5 (A) 04/03/2020   BILITOT 0.7 03/05/2020   ALKPHOS 54 04/03/2020   AST 36 (A) 04/03/2020   ALT 25 04/03/2020   PROT 7.3 03/05/2020   ALBUMIN 4.0 04/03/2020   CALCIUM 9.0 04/03/2020   GFR 31.03 (L) 03/05/2020   Lab Results  Component Value Date   CHOL 122 07/24/2019   Lab Results  Component Value Date   HDL 41.30 07/24/2019   Lab Results  Component Value Date   LDLCALC 51 07/24/2019   Lab Results  Component Value Date   TRIG 147.0 07/24/2019   Lab Results  Component Value Date   CHOLHDL 3 07/24/2019   Lab Results  Component Value Date   HGBA1C 6.7 (H) 03/05/2020       Assessment & Plan:   Problem List Items Addressed This Visit      Unprioritized   Dementia without behavioral disturbance ([place]) - Primary   Relevant Medications   gabapentin (NEURONTIN) 300 MG capsule   Other Relevant Orders   AMB Referral to [place]   Essential hypertension    Well controlled, no changes to meds. Encouraged heart healthy diet such as the DASH diet and exercise as tolerated.       Frequent falls    Pt/ ot and home health      Relevant Orders   AMB Referral to [place]   Hyperlipidemia    Encouraged heart healthy diet, increase exercise, avoid trans fats, consider a krill oil cap daily      Relevant Orders   Lipid panel   CBC with Differential/Platelet   Comprehensive metabolic panel   Idiopathic chronic gout of foot without tophus    Improved       Relevant Medications   allopurinol (ZYLOPRIM) 300 MG tablet   Other Relevant Orders   Uric acid   CBC with Differential/Platelet   Comprehensive metabolic panel   Iron deficiency anemia, unspecified    Check labs      Relevant Orders   IBC + Ferritin   CBC with Differential/Platelet   Comprehensive metabolic panel   Uncontrolled type 2 diabetes mellitus with hyperglycemia (HCC)    hgba1c to be checked, minimize simple carbs. Increase exercise as tolerated.  Continue current meds      Relevant Orders   Hemoglobin A1c   CBC with Differential/Platelet   Comprehensive metabolic panel       Meds ordered this encounter  Medications  . allopurinol (ZYLOPRIM) 300 MG tablet    Sig: Take 1 tablet (300 mg total) by mouth daily.    Dispense:  90 tablet    Refill:  3  . gabapentin (NEURONTIN) 300 MG capsule    Sig: Take 1 capsule (300 mg total) by mouth at bedtime.    Dispense:  90 capsule    Refill:  3  I, Dr. Roma Schanz, DO, personally preformed the services described in this documentation.  All medical record entries made by the scribe were at my direction and in my presence.  I have reviewed the chart and discharge instructions (if applicable) and agree that the record reflects my personal performance and is accurate and complete. 08/26/2020   I,Erin [name],acting as a scribe for Ann Held, DO.,have documented all relevant documentation on the behalf of Ann Held, DO,as directed by  Ann Held, DO while in the presence of Ann Held, DO.   Ann Held, DO

## 2020-08-26 NOTE — Patient Instructions (Signed)

## 2020-08-27 ENCOUNTER — Telehealth: Payer: Self-pay | Admitting: *Deleted

## 2020-08-27 DIAGNOSIS — M1A0790 Idiopathic chronic gout, unspecified ankle and foot, without tophus (tophi): Secondary | ICD-10-CM | POA: Insufficient documentation

## 2020-08-27 DIAGNOSIS — F0390 Unspecified dementia without behavioral disturbance: Secondary | ICD-10-CM | POA: Insufficient documentation

## 2020-08-27 DIAGNOSIS — R296 Repeated falls: Secondary | ICD-10-CM | POA: Insufficient documentation

## 2020-08-27 DIAGNOSIS — E1165 Type 2 diabetes mellitus with hyperglycemia: Secondary | ICD-10-CM | POA: Insufficient documentation

## 2020-08-27 LAB — COMPREHENSIVE METABOLIC PANEL
ALT: 17 U/L (ref 0–35)
AST: 21 U/L (ref 0–37)
Albumin: 4.2 g/dL (ref 3.5–5.2)
Alkaline Phosphatase: 110 U/L (ref 39–117)
BUN: 51 mg/dL — ABNORMAL HIGH (ref 6–23)
CO2: 25 mEq/L (ref 19–32)
Calcium: 9.1 mg/dL (ref 8.4–10.5)
Chloride: 105 mEq/L (ref 96–112)
Creatinine, Ser: 1.38 mg/dL — ABNORMAL HIGH (ref 0.40–1.20)
GFR: 37.48 mL/min — ABNORMAL LOW (ref >60.00)
Glucose, Bld: 115 mg/dL — ABNORMAL HIGH (ref 70–99)
Potassium: 4.6 mEq/L (ref 3.5–5.1)
Sodium: 140 mEq/L (ref 135–145)
Total Bilirubin: 0.7 mg/dL (ref 0.2–1.2)
Total Protein: 6.5 g/dL (ref 6.0–8.3)

## 2020-08-27 LAB — CBC WITH DIFFERENTIAL/PLATELET
Basophils Absolute: 0 10*3/uL (ref 0.0–0.1)
Basophils Relative: 0.3 % (ref 0.0–3.0)
Eosinophils Absolute: 0.1 10*3/uL (ref 0.0–0.7)
Eosinophils Relative: 0.7 % (ref 0.0–5.0)
HCT: 34.5 % — ABNORMAL LOW (ref 36.0–46.0)
Hemoglobin: 11.4 g/dL — ABNORMAL LOW (ref 12.0–15.0)
Lymphocytes Relative: 9.8 % — ABNORMAL LOW (ref 12.0–46.0)
Lymphs Abs: 0.8 10*3/uL (ref 0.7–4.0)
MCHC: 33.2 g/dL (ref 30.0–36.0)
MCV: 92.8 fl (ref 78.0–100.0)
Monocytes Absolute: 0.7 10*3/uL (ref 0.1–1.0)
Monocytes Relative: 8.6 % (ref 3.0–12.0)
Neutro Abs: 6.7 10*3/uL (ref 1.4–7.7)
Neutrophils Relative %: 80.6 % — ABNORMAL HIGH (ref 43.0–77.0)
Platelets: 92 10*3/uL — ABNORMAL LOW (ref 150.0–400.0)
RBC: 3.71 Mil/uL — ABNORMAL LOW (ref 3.87–5.11)
RDW: 16.1 % — ABNORMAL HIGH (ref 11.5–15.5)
WBC: 8.4 10*3/uL (ref 4.0–10.5)

## 2020-08-27 LAB — LIPID PANEL
Cholesterol: 153 mg/dL (ref 0–200)
HDL: 38.5 mg/dL — ABNORMAL LOW (ref >39.00)
NonHDL: 114.63
Total CHOL/HDL Ratio: 4
Triglycerides: 321 mg/dL — ABNORMAL HIGH (ref 0.0–149.0)
VLDL: 64.2 mg/dL — ABNORMAL HIGH (ref 0.0–40.0)

## 2020-08-27 LAB — IBC + FERRITIN
Ferritin: 273.3 ng/mL (ref 10.0–291.0)
Iron: 44 ug/dL (ref 42–145)
Saturation Ratios: 14.8 % — ABNORMAL LOW (ref 20.0–50.0)
Transferrin: 213 mg/dL (ref 212.0–360.0)

## 2020-08-27 LAB — LDL CHOLESTEROL, DIRECT: Direct LDL: 68 mg/dL

## 2020-08-27 LAB — HEMOGLOBIN A1C: Hgb A1c MFr Bld: 6.3 % (ref 4.6–6.5)

## 2020-08-27 LAB — URIC ACID: Uric Acid, Serum: 5 mg/dL (ref 2.4–7.0)

## 2020-08-27 NOTE — Assessment & Plan Note (Signed)
Encouraged heart healthy diet, increase exercise, avoid trans fats, consider a krill oil cap daily 

## 2020-08-27 NOTE — Assessment & Plan Note (Signed)
Pt/ ot and home health

## 2020-08-27 NOTE — Assessment & Plan Note (Signed)
Check labs 

## 2020-08-27 NOTE — Assessment & Plan Note (Signed)
Improved

## 2020-08-27 NOTE — Assessment & Plan Note (Signed)
hgba1c to be checked, minimize simple carbs. Increase exercise as tolerated. Continue current meds  

## 2020-08-27 NOTE — Chronic Care Management (AMB) (Signed)
  Chronic Care Management   Note  08/27/2020 Name: Erin [name] MRN: [id number] DOB: 01-Sep-1945  Erin [name] is a 75 y.o. year old female who is a primary care patient of Ann Held, DO. I reached out to [name] by phone today in response to a referral sent by Ms. [place] PCP, Ann Held, DO.     [name] was given information about Chronic Care Management services today including:  1. CCM service includes personalized support from designated clinical staff supervised by her physician, including individualized plan of care and coordination with other care providers 2. 24/7 contact phone numbers for assistance for urgent and routine care needs. 3. Service will only be billed when office clinical staff spend 20 minutes or more in a month to coordinate care. 4. Only one practitioner may furnish and bill the service in a calendar month. 5. The patient may stop CCM services at any time (effective at the end of the month) by phone call to the office staff. 6. The patient will be responsible for cost sharing (co-pay) of up to 20% of the service fee (after annual deductible is met).  Daughter Ariyana Faw DPR on file  verbally agreed to assistance and services provided by embedded care coordination/care management team today.  Follow up plan: Telephone appointment with care management team member scheduled for:09/05/2020  [place] Management

## 2020-08-27 NOTE — Assessment & Plan Note (Signed)
Well controlled, no changes to meds. Encouraged heart healthy diet such as the DASH diet and exercise as tolerated.  °

## 2020-08-29 DIAGNOSIS — M5117 Intervertebral disc disorders with radiculopathy, lumbosacral region: Secondary | ICD-10-CM | POA: Diagnosis not present

## 2020-08-29 DIAGNOSIS — G8929 Other chronic pain: Secondary | ICD-10-CM | POA: Diagnosis not present

## 2020-08-29 DIAGNOSIS — I130 Hypertensive heart and chronic kidney disease with heart failure and stage 1 through stage 4 chronic kidney disease, or unspecified chronic kidney disease: Secondary | ICD-10-CM | POA: Diagnosis not present

## 2020-08-29 DIAGNOSIS — E1122 Type 2 diabetes mellitus with diabetic chronic kidney disease: Secondary | ICD-10-CM | POA: Diagnosis not present

## 2020-08-29 DIAGNOSIS — M4726 Other spondylosis with radiculopathy, lumbar region: Secondary | ICD-10-CM | POA: Diagnosis not present

## 2020-08-29 DIAGNOSIS — I5042 Chronic combined systolic (congestive) and diastolic (congestive) heart failure: Secondary | ICD-10-CM | POA: Diagnosis not present

## 2020-08-30 ENCOUNTER — Other Ambulatory Visit: Payer: Self-pay | Admitting: Family Medicine

## 2020-09-01 LAB — CUP PACEART REMOTE DEVICE CHECK
Date Time Interrogation Session: 20220528011053
Implantable Pulse Generator Implant Date: 20190429

## 2020-09-02 ENCOUNTER — Ambulatory Visit (INDEPENDENT_AMBULATORY_CARE_PROVIDER_SITE_OTHER): Payer: Medicare Other

## 2020-09-02 DIAGNOSIS — I639 Cerebral infarction, unspecified: Secondary | ICD-10-CM | POA: Diagnosis not present

## 2020-09-04 ENCOUNTER — Ambulatory Visit: Payer: Medicare Other | Admitting: Family Medicine

## 2020-09-05 ENCOUNTER — Ambulatory Visit (INDEPENDENT_AMBULATORY_CARE_PROVIDER_SITE_OTHER): Payer: Medicare Other | Admitting: *Deleted

## 2020-09-05 ENCOUNTER — Telehealth: Payer: Medicare Other

## 2020-09-05 DIAGNOSIS — G8929 Other chronic pain: Secondary | ICD-10-CM | POA: Diagnosis not present

## 2020-09-05 DIAGNOSIS — E1165 Type 2 diabetes mellitus with hyperglycemia: Secondary | ICD-10-CM | POA: Diagnosis not present

## 2020-09-05 DIAGNOSIS — J42 Unspecified chronic bronchitis: Secondary | ICD-10-CM | POA: Diagnosis not present

## 2020-09-05 DIAGNOSIS — F0390 Unspecified dementia without behavioral disturbance: Secondary | ICD-10-CM

## 2020-09-05 DIAGNOSIS — E1122 Type 2 diabetes mellitus with diabetic chronic kidney disease: Secondary | ICD-10-CM | POA: Diagnosis not present

## 2020-09-05 DIAGNOSIS — N179 Acute kidney failure, unspecified: Secondary | ICD-10-CM

## 2020-09-05 DIAGNOSIS — R296 Repeated falls: Secondary | ICD-10-CM

## 2020-09-05 DIAGNOSIS — I130 Hypertensive heart and chronic kidney disease with heart failure and stage 1 through stage 4 chronic kidney disease, or unspecified chronic kidney disease: Secondary | ICD-10-CM | POA: Diagnosis not present

## 2020-09-05 DIAGNOSIS — I5042 Chronic combined systolic (congestive) and diastolic (congestive) heart failure: Secondary | ICD-10-CM | POA: Diagnosis not present

## 2020-09-05 DIAGNOSIS — I10 Essential (primary) hypertension: Secondary | ICD-10-CM

## 2020-09-05 DIAGNOSIS — M4726 Other spondylosis with radiculopathy, lumbar region: Secondary | ICD-10-CM | POA: Diagnosis not present

## 2020-09-05 DIAGNOSIS — M5117 Intervertebral disc disorders with radiculopathy, lumbosacral region: Secondary | ICD-10-CM | POA: Diagnosis not present

## 2020-09-05 DIAGNOSIS — M1A09X0 Idiopathic chronic gout, multiple sites, without tophus (tophi): Secondary | ICD-10-CM

## 2020-09-05 NOTE — Patient Instructions (Signed)
Visit Information   PATIENT GOALS:  Goals Addressed            This Visit's Progress   . COMPLETED: Maintain My Quality of Life By Receiving [place].   On track    Timeframe:  Short-Term Goal Priority:  High Start Date:  09/05/2020                       Expected End Date:    09/05/2020             Follow-Up Date:  No Follow-Up Required.    Patient Goals/Self-Care Activities:  . Reviewed list of [place], [place] and [place] with patient and daughter, Hafsa Lohn and helped narrow down choices. . Daughter, Pietrina Jagodzinski agreed to contact the 5 agencies of interest today and notify LCSW directly (# [phone]) if additional assistance is needed, or if additional social work needs are identified in the near future. . Continue to receive in-home care assistance from 4 children and sister, until in-home care services are arranged through a private-pay agency. Remember: . Always use handrails on the stairs. . Always use your cane, walker or wheelchair when ambulating. . Always wear your glasses, especially at night or in areas not well lit.          Consent to CCM Services: Ms. Percifield was given information about Chronic Care Management services today including:  1. CCM service includes personalized support from designated clinical staff supervised by her physician, including individualized plan of care and coordination with other care providers 2. 24/7 contact phone numbers for assistance for urgent and routine care needs. 3. Service will only be billed when office clinical staff spend 20 minutes or more in a month to coordinate care. 4. Only one practitioner may furnish and bill the service in a calendar month. 5. The patient may stop CCM services at any time (effective at the end of the month) by phone call to the office staff. 6. The patient will be responsible for cost sharing (co-pay) of up to 20% of the  service fee (after annual deductible is met).  Patient agreed to services and verbal consent obtained.   Patient verbalizes understanding of instructions provided today and agrees to view in [place].   Telephone follow up appointment with care management team member scheduled for:  No Follow-Up Required.  [place] LCSW Licensed Clinical Social Worker [place] [phone]  CLINICAL CARE PLAN: Patient Care Plan: LCSW Plan of Care    Problem Identified: Maintain My Quality of Life By Receiving [place]. Resolved 09/05/2020  Priority: High    Goal: Maintain My Quality of Life By Receiving [facility]. Completed 09/05/2020  Start Date: 09/05/2020  Expected End Date: 09/05/2020  This Visit's Progress: On track  Priority: High  Note:   Current Barriers:   . Patient with Dementia without Behavioral Disturbance, Acute Renal Failure, Uncontrolled Diabetes Mellitus, Type II, with Hyperglycemia, Degenerative Arthritis of Bilateral Knees and Frequent Falls needs Support, Education, and Care Coordination to resolve unmet personal care needs in the home. . Patient is unable to consistently perform ADL's/IADL's independently. . Level of care concerns in patient with Dementia. Leodis [place] knowledge of available community agencies and resources. Clinical Goals:  . Over the next 24-48 hours, patient will have in-home care services in place to provide supervision and assist with activities of daily living. . Patient and  daughter, Shelle Galdamez will work with LCSW, to coordinate in-home care services. . Patient and daughter, Rondalyn Belford will select an in-home care agency of choice, from the list provided by LCSW. . Patient will attend all scheduled medical appointments as evidenced by patient report and care team review of appointment completion in electronic medical record. . Patient will demonstrate improved health management independence as evidenced by  having in-home care services in place. Clinical Interventions : . Patient and daughter, Nyelli Samara interviewed and appropriate assessments performed. . Collaboration with Dr. Roma Schanz regarding development and update of comprehensive plan of care as evidenced by provider attestation and co-signature. Bertram Savin care team collaboration (see longitudinal plan of care). . Interventions performed:  Problem Solving/Task Centered, Psychoeducation/Health Education, Quality of Sleep Assessed and Sleep Hygiene Techniques Promoted, Caregiver Stress Acknowledged and Consideration of In-Home Care Services Encouraged. . Provided patient and daughter with the following list of resources and applications:  [place] and [place]. . Discussed plans with patient and daughter, Yexalen Deike for ongoing care management follow-up and provided direct contact information for care management team. . Assisted patient and daughter, Shirelle Tootle with obtaining information about health plan benefits. . Provided education to patient and daughter, Maelin Kurkowski regarding level of care options. . Assessed needs, level of care concerns, basic eligibility and provided education. . Identified resources and durable medical equipment needed in the home to improve safety and promote independence. Patient Goals/Self-Care Activities:  . Reviewed list of [place], [place] and [place] with patient and daughter, Keimya Briddell and helped narrow down choices. . Daughter, Maxyne Derocher agreed to contact the 5 agencies of interest today and notify LCSW directly (# [phone]) if additional assistance is needed, or if additional social work needs are identified in the near future. . Continue to receive in-home care assistance from 4 children and sister, until in-home care services are arranged  through a private-pay agency. Remember: . Always use handrails on the stairs. . Always use your cane, walker or wheelchair when ambulating. . Always wear your glasses, especially at night or in areas not well lit.  Follow-Up Plan:  No Follow-Up Required.

## 2020-09-05 NOTE — Chronic Care Management (AMB) (Signed)
Chronic Care Management    Clinical Social Work Note  09/05/2020 Name: Erin [name] MRN: [id number] DOB: July 21, 1945  Erin [name] is a 75 y.o. year old female who is a primary care patient of Ann Held, DO. The CCM team was consulted to assist the patient with chronic disease management and/or care coordination needs related to:  Level of Care Concerns in Patient with Dementia without Behavioral Disturbance, Acute Renal Failure, Uncontrolled Diabetes Mellitus, Type II, with Hyperglycemia, Degenerative Arthritis of Bilateral Knees and Frequent Falls needs Support, Education, and Care Coordination to resolve unmet personal care needs in the home.  Patient is unable to consistently perform ADL's/IADL's independently.  Level of care concerns in patient with Dementia.  .   Engaged with patient by telephone for initial visit in response to provider referral for social work chronic care management and care coordination services.   Consent to Services:  The patient and patient's daughter, Zaylynn Rickett were given information about Chronic Care Management services, agreed to services, and gave verbal consent prior to initiation of services.  Please see initial visit note for detailed documentation.   Patient agreed to services and consent obtained.   Assessment: Review of patient past medical history, allergies, medications, and health status, including review of relevant consultants reports was performed today as part of a comprehensive evaluation and provision of chronic care management and care coordination services.     SDOH (Social Determinants of Health) assessments and interventions performed:  SDOH Interventions   Flowsheet Row Most Recent Value  SDOH Interventions   Food Insecurity Interventions Intervention Not Indicated  Financial Strain Interventions Intervention Not Indicated  Housing Interventions Intervention Not Indicated  Intimate Partner Violence Interventions  Intervention Not Indicated  Physical Activity Interventions Intervention Not Indicated, Patient Refused  Stress Interventions Intervention Not Indicated  Social Connections Interventions Intervention Not Indicated, Patient Refused  Transportation Interventions Intervention Not Indicated  Depression Interventions/Treatment  Medication, Patient refuses Treatment       Advanced Directives Status: See Care Plan for related entries.  CCM Care Plan  Allergies  Allergen Reactions  . Bee Venom     Outpatient Encounter Medications as of 09/05/2020  Medication Sig  . Acetaminophen (TYLENOL 8 HOUR PO) Take 500 mg by mouth. (Patient not taking: No sig reported)  . albuterol (PROVENTIL) (2.5 MG/3ML) 0.083% nebulizer solution Take 3 mLs (2.5 mg total) by nebulization every 6 (six) hours as needed for wheezing or shortness of breath.  Marland Kitchen albuterol (VENTOLIN HFA) 108 (90 Base) MCG/ACT inhaler Inhale 2 puffs into the lungs every 4 (four) hours as needed for wheezing or shortness of breath.   . allopurinol (ZYLOPRIM) 300 MG tablet Take 1 tablet (300 mg total) by mouth daily.  . AMBULATORY NON FORMULARY MEDICATION Rollator  . atorvastatin (LIPITOR) 40 MG tablet TAKE 1 TABLET (40 MG TOTAL) BY MOUTH DAILY. PT NEEDS OV FOR FURTHER REFILLS  . BD PEN NEEDLE NANO 2ND GEN 32G X 4 MM MISC USE 4 TIMES A DAY  . busPIRone (BUSPAR) 15 MG tablet Take 1 tablet (15 mg total) by mouth 3 (three) times daily. Pt needs OV for further refills  . carvedilol (COREG) 6.25 MG tablet TAKE 1.5 TABLETS (9.375 MG TOTAL) BY MOUTH 2 (TWO) TIMES DAILY WITH A MEAL.  Marland Kitchen clopidogrel (PLAVIX) 75 MG tablet Take 1 tablet (75 mg total) by mouth daily.  . diclofenac sodium (VOLTAREN) 1 % GEL Apply 4 g topically 4 (four) times daily.  Marland Kitchen EPINEPHrine 0.3 mg/0.3  mL IJ SOAJ injection PLEASE SEE ATTACHED FOR DETAILED DIRECTIONS  . ergocalciferol (VITAMIN D2) 1.25 MG (50000 UT) capsule Take 1 capsule (50,000 Units total) by mouth once a week. Wednesday   . fenofibrate micronized (LOFIBRA) 200 MG capsule Take 1 capsule (200 mg total) by mouth daily. Pt need OV for further refills  . ferrous sulfate 325 (65 FE) MG tablet Take 325 mg by mouth.  . Fluticasone-Salmeterol (ADVAIR) 250-50 MCG/DOSE AEPB Inhale 1 puff into the lungs 2 (two) times daily.  Marland Kitchen gabapentin (NEURONTIN) 300 MG capsule Take 1 capsule (300 mg total) by mouth at bedtime.  Marland Kitchen glucose blood test strip Use 3x a day - One TOUch Ultra  . HYDROcodone-acetaminophen (NORCO/VICODIN) 5-325 MG tablet Take 1 tablet by mouth every 4 (four) hours as needed for moderate pain.  . Insulin Glargine (BASAGLAR KWIKPEN) 100 UNIT/ML INJECT 0.4 MLS (40 UNITS TOTAL) INTO THE SKIN AT BEDTIME.  . pantoprazole (PROTONIX) 40 MG tablet Take 1 tablet (40 mg total) by mouth daily.  . pramipexole (MIRAPEX) 1 MG tablet Take 1 tablet (1 mg total) by mouth at bedtime.  . sertraline (ZOLOFT) 50 MG tablet TAKE 1 AND 1/2 TABLETS DAILY BY MOUTH  . torsemide (DEMADEX) 20 MG tablet TAKE FOUR TABLETS BY MOUTH TWICE DAILY   No facility-administered encounter medications on file as of 09/05/2020.    Patient Active Problem List   Diagnosis Date Noted  . Dementia without behavioral disturbance ([place]) 08/27/2020  . Frequent falls 08/27/2020  . Idiopathic chronic gout of foot without tophus 08/27/2020  . Uncontrolled type 2 diabetes mellitus with hyperglycemia ([place]) 08/27/2020  . Iron deficiency anemia, unspecified 04/03/2020    Class: Chronic  . Diabetes mellitus type II, controlled ([place]) 01/23/2019  . Chronic kidney disease   . Degenerative arthritis of knee, bilateral 11/29/2018  . Cryptogenic stroke ([place]) 07/14/2017  . Chronic venous insufficiency 12/20/2016  . Varicose veins of bilateral lower extremities with pain 12/20/2016  . Lymphedema 12/20/2016  . 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 DM (diabetes mellitus) type II uncontrolled, periph vascular disorder  ([place]) 12/31/2014  . Edema 06/09/2014  . History of bacterial pneumonia 05/24/2014  . OSA (obstructive sleep apnea) 04/18/2014  . CAP (community acquired pneumonia) 03/15/2014  . Viral URI with cough 02/18/2014  . CHF (congestive heart failure) ([place]) 11/29/2013  . Acute renal failure ([place]) 11/29/2013  . Preop examination 10/25/2013  . Severe obesity (BMI >= 40) ([place]) 01/17/2013  . Back pain with radiation 05/17/2011  . Urinary frequency 05/17/2011  . COPD (chronic obstructive pulmonary disease) ([place]) 02/23/2011  . DOE (dyspnea on exertion) 02/22/2011  . UNSPECIFIED VITAMIN D DEFICIENCY 06/18/2010  . KNEE PAIN, RIGHT 04/07/2010  . SINUSITIS - ACUTE-NOS 03/17/2009  . RESTLESS LEG SYNDROME, MILD 01/06/2009  . Essential hypertension 12/02/2008  . MAMMOGRAM, ABNORMAL, RIGHT 04/08/2008  . POSTMENOPAUSAL STATUS 10/16/2007  . Hyperlipidemia 11/08/2006  . THROMBOCYTOPENIA NOS 11/08/2006  . Anxiety 11/08/2006  . Depression, major, single episode, mild ([place]) 11/08/2006    Conditions to be addressed/monitored: Level of Care Concerns in Patient with Dementia without Behavioral Disturbance, Acute Renal Failure, Uncontrolled Diabetes Mellitus, Type II, with Hyperglycemia, Degenerative Arthritis of Bilateral Knees and Frequent Falls needs Support, Education, and Care Coordination to resolve unmet personal care needs in the home.   Patient is unable to consistently perform ADL's/IADL's independently.  Level of care concerns in patient with Dementia.   Care Plan : LCSW Plan of Care  Updates made by Francis Gaines, LCSW since 09/05/2020 12:00 AM    Problem: Maintain My Quality of Life By Receiving [place]. Resolved 09/05/2020  Priority: High    Goal: Maintain My Quality of Life By Receiving [facility]. Completed 09/05/2020  Start Date: 09/05/2020  Expected End Date: 09/05/2020  This Visit's Progress: On track  Priority: High  Note:   Current Barriers:   . Patient with  Dementia without Behavioral Disturbance, Acute Renal Failure, Uncontrolled Diabetes Mellitus, Type II, with Hyperglycemia, Degenerative Arthritis of Bilateral Knees and Frequent Falls needs Support, Education, and Care Coordination to resolve unmet personal care needs in the home. . Patient is unable to consistently perform ADL's/IADL's independently. . Level of care concerns in patient with Dementia. Leodis Liverpool knowledge of available community agencies and resources. Clinical Goals:  . Over the next 24-48 hours, patient will have in-home care services in place to provide supervision and assist with activities of daily living. . Patient and daughter, Lerae Langham will work with LCSW, to coordinate in-home care services. . Patient and daughter, Annalyse Langlais will select an in-home care agency of choice, from the list provided by LCSW. . Patient will attend all scheduled medical appointments as evidenced by patient report and care team review of appointment completion in electronic medical record. . Patient will demonstrate improved health management independence as evidenced by having in-home care services in place. Clinical Interventions : . Patient and daughter, Tiersa Dayley interviewed and appropriate assessments performed. . Collaboration with Dr. Roma Schanz regarding development and update of comprehensive plan of care as evidenced by provider attestation and co-signature. Bertram Savin care team collaboration (see longitudinal plan of care). . Interventions performed:  Problem Solving/Task Centered, Psychoeducation/Health Education, Quality of Sleep Assessed and Sleep Hygiene Techniques Promoted, Caregiver Stress Acknowledged and Consideration of In-Home Care Services Encouraged. . Provided patient and daughter with the following list of resources and applications:  [place] and [facility]. . Discussed plans with  patient and daughter, Joel Mericle for ongoing care management follow-up and provided direct contact information for care management team. . Assisted patient and daughter, Aurie Harroun with obtaining information about health plan benefits. . Provided education to patient and daughter, Lonita Debes regarding level of care options. . Assessed needs, level of care concerns, basic eligibility and provided education. . Identified resources and durable medical equipment needed in the home to improve safety and promote independence. Patient Goals/Self-Care Activities:  . Reviewed list of [place] and [place] with patient and daughter, Ladonya Jerkins and helped narrow down choices. . Daughter, Shanley Furlough agreed to contact the 5 agencies of interest today and notify LCSW directly (# [phone]) if additional assistance is needed, or if additional social work needs are identified in the near future. . Continue to receive in-home care assistance from 4 children and sister, until in-home care services are arranged through a private-pay agency. Remember: . Always use handrails on the stairs. . Always use your cane, walker or wheelchair when ambulating. . Always wear your glasses, especially at night or in areas not well lit.  Follow-Up Plan:  No Follow-Up Required.       Follow Up Plan:  No Follow-Up Required.      [place] Licensed Clinical Social Worker [place] [phone]

## 2020-09-08 DIAGNOSIS — E1122 Type 2 diabetes mellitus with diabetic chronic kidney disease: Secondary | ICD-10-CM | POA: Diagnosis not present

## 2020-09-08 DIAGNOSIS — M4726 Other spondylosis with radiculopathy, lumbar region: Secondary | ICD-10-CM | POA: Diagnosis not present

## 2020-09-08 DIAGNOSIS — G8929 Other chronic pain: Secondary | ICD-10-CM | POA: Diagnosis not present

## 2020-09-08 DIAGNOSIS — M5117 Intervertebral disc disorders with radiculopathy, lumbosacral region: Secondary | ICD-10-CM | POA: Diagnosis not present

## 2020-09-08 DIAGNOSIS — I5042 Chronic combined systolic (congestive) and diastolic (congestive) heart failure: Secondary | ICD-10-CM | POA: Diagnosis not present

## 2020-09-08 DIAGNOSIS — I130 Hypertensive heart and chronic kidney disease with heart failure and stage 1 through stage 4 chronic kidney disease, or unspecified chronic kidney disease: Secondary | ICD-10-CM | POA: Diagnosis not present

## 2020-09-10 DIAGNOSIS — M5117 Intervertebral disc disorders with radiculopathy, lumbosacral region: Secondary | ICD-10-CM | POA: Diagnosis not present

## 2020-09-10 DIAGNOSIS — E1122 Type 2 diabetes mellitus with diabetic chronic kidney disease: Secondary | ICD-10-CM | POA: Diagnosis not present

## 2020-09-10 DIAGNOSIS — M4726 Other spondylosis with radiculopathy, lumbar region: Secondary | ICD-10-CM | POA: Diagnosis not present

## 2020-09-10 DIAGNOSIS — I130 Hypertensive heart and chronic kidney disease with heart failure and stage 1 through stage 4 chronic kidney disease, or unspecified chronic kidney disease: Secondary | ICD-10-CM | POA: Diagnosis not present

## 2020-09-10 DIAGNOSIS — I5042 Chronic combined systolic (congestive) and diastolic (congestive) heart failure: Secondary | ICD-10-CM | POA: Diagnosis not present

## 2020-09-10 DIAGNOSIS — G8929 Other chronic pain: Secondary | ICD-10-CM | POA: Diagnosis not present

## 2020-09-12 DIAGNOSIS — I5042 Chronic combined systolic (congestive) and diastolic (congestive) heart failure: Secondary | ICD-10-CM | POA: Diagnosis not present

## 2020-09-12 DIAGNOSIS — M5117 Intervertebral disc disorders with radiculopathy, lumbosacral region: Secondary | ICD-10-CM | POA: Diagnosis not present

## 2020-09-12 DIAGNOSIS — E1122 Type 2 diabetes mellitus with diabetic chronic kidney disease: Secondary | ICD-10-CM | POA: Diagnosis not present

## 2020-09-12 DIAGNOSIS — G8929 Other chronic pain: Secondary | ICD-10-CM | POA: Diagnosis not present

## 2020-09-12 DIAGNOSIS — M4726 Other spondylosis with radiculopathy, lumbar region: Secondary | ICD-10-CM | POA: Diagnosis not present

## 2020-09-12 DIAGNOSIS — I130 Hypertensive heart and chronic kidney disease with heart failure and stage 1 through stage 4 chronic kidney disease, or unspecified chronic kidney disease: Secondary | ICD-10-CM | POA: Diagnosis not present

## 2020-09-15 ENCOUNTER — Other Ambulatory Visit: Payer: Self-pay | Admitting: Family Medicine

## 2020-09-15 DIAGNOSIS — I5042 Chronic combined systolic (congestive) and diastolic (congestive) heart failure: Secondary | ICD-10-CM | POA: Diagnosis not present

## 2020-09-15 DIAGNOSIS — M5117 Intervertebral disc disorders with radiculopathy, lumbosacral region: Secondary | ICD-10-CM | POA: Diagnosis not present

## 2020-09-15 DIAGNOSIS — E1122 Type 2 diabetes mellitus with diabetic chronic kidney disease: Secondary | ICD-10-CM | POA: Diagnosis not present

## 2020-09-15 DIAGNOSIS — M4726 Other spondylosis with radiculopathy, lumbar region: Secondary | ICD-10-CM | POA: Diagnosis not present

## 2020-09-15 DIAGNOSIS — G8929 Other chronic pain: Secondary | ICD-10-CM | POA: Diagnosis not present

## 2020-09-15 DIAGNOSIS — I130 Hypertensive heart and chronic kidney disease with heart failure and stage 1 through stage 4 chronic kidney disease, or unspecified chronic kidney disease: Secondary | ICD-10-CM | POA: Diagnosis not present

## 2020-09-23 DIAGNOSIS — R41841 Cognitive communication deficit: Secondary | ICD-10-CM | POA: Diagnosis not present

## 2020-09-23 DIAGNOSIS — E1122 Type 2 diabetes mellitus with diabetic chronic kidney disease: Secondary | ICD-10-CM | POA: Diagnosis not present

## 2020-09-23 DIAGNOSIS — I69320 Aphasia following cerebral infarction: Secondary | ICD-10-CM | POA: Diagnosis not present

## 2020-09-23 DIAGNOSIS — D631 Anemia in chronic kidney disease: Secondary | ICD-10-CM | POA: Diagnosis not present

## 2020-09-23 DIAGNOSIS — K219 Gastro-esophageal reflux disease without esophagitis: Secondary | ICD-10-CM | POA: Diagnosis not present

## 2020-09-23 DIAGNOSIS — E559 Vitamin D deficiency, unspecified: Secondary | ICD-10-CM | POA: Diagnosis not present

## 2020-09-23 DIAGNOSIS — E1151 Type 2 diabetes mellitus with diabetic peripheral angiopathy without gangrene: Secondary | ICD-10-CM | POA: Diagnosis not present

## 2020-09-23 DIAGNOSIS — I130 Hypertensive heart and chronic kidney disease with heart failure and stage 1 through stage 4 chronic kidney disease, or unspecified chronic kidney disease: Secondary | ICD-10-CM | POA: Diagnosis not present

## 2020-09-23 DIAGNOSIS — G8929 Other chronic pain: Secondary | ICD-10-CM | POA: Diagnosis not present

## 2020-09-23 DIAGNOSIS — M1030 Gout due to renal impairment, unspecified site: Secondary | ICD-10-CM | POA: Diagnosis not present

## 2020-09-23 DIAGNOSIS — I5042 Chronic combined systolic (congestive) and diastolic (congestive) heart failure: Secondary | ICD-10-CM | POA: Diagnosis not present

## 2020-09-23 DIAGNOSIS — F329 Major depressive disorder, single episode, unspecified: Secondary | ICD-10-CM | POA: Diagnosis not present

## 2020-09-23 DIAGNOSIS — J45909 Unspecified asthma, uncomplicated: Secondary | ICD-10-CM | POA: Diagnosis not present

## 2020-09-23 DIAGNOSIS — N183 Chronic kidney disease, stage 3 unspecified: Secondary | ICD-10-CM | POA: Diagnosis not present

## 2020-09-23 DIAGNOSIS — M4726 Other spondylosis with radiculopathy, lumbar region: Secondary | ICD-10-CM | POA: Diagnosis not present

## 2020-09-23 DIAGNOSIS — E785 Hyperlipidemia, unspecified: Secondary | ICD-10-CM | POA: Diagnosis not present

## 2020-09-23 DIAGNOSIS — Z9181 History of falling: Secondary | ICD-10-CM | POA: Diagnosis not present

## 2020-09-23 DIAGNOSIS — M5117 Intervertebral disc disorders with radiculopathy, lumbosacral region: Secondary | ICD-10-CM | POA: Diagnosis not present

## 2020-09-23 DIAGNOSIS — F419 Anxiety disorder, unspecified: Secondary | ICD-10-CM | POA: Diagnosis not present

## 2020-09-23 DIAGNOSIS — M47812 Spondylosis without myelopathy or radiculopathy, cervical region: Secondary | ICD-10-CM | POA: Diagnosis not present

## 2020-09-23 DIAGNOSIS — K5900 Constipation, unspecified: Secondary | ICD-10-CM | POA: Diagnosis not present

## 2020-09-23 DIAGNOSIS — D696 Thrombocytopenia, unspecified: Secondary | ICD-10-CM | POA: Diagnosis not present

## 2020-09-23 DIAGNOSIS — G4733 Obstructive sleep apnea (adult) (pediatric): Secondary | ICD-10-CM | POA: Diagnosis not present

## 2020-09-23 DIAGNOSIS — Z981 Arthrodesis status: Secondary | ICD-10-CM | POA: Diagnosis not present

## 2020-09-24 NOTE — Progress Notes (Signed)
Carelink Summary Report / Loop Recorder 

## 2020-09-26 DIAGNOSIS — I5042 Chronic combined systolic (congestive) and diastolic (congestive) heart failure: Secondary | ICD-10-CM | POA: Diagnosis not present

## 2020-09-26 DIAGNOSIS — M4726 Other spondylosis with radiculopathy, lumbar region: Secondary | ICD-10-CM | POA: Diagnosis not present

## 2020-09-26 DIAGNOSIS — E1122 Type 2 diabetes mellitus with diabetic chronic kidney disease: Secondary | ICD-10-CM | POA: Diagnosis not present

## 2020-09-26 DIAGNOSIS — G8929 Other chronic pain: Secondary | ICD-10-CM | POA: Diagnosis not present

## 2020-09-26 DIAGNOSIS — I130 Hypertensive heart and chronic kidney disease with heart failure and stage 1 through stage 4 chronic kidney disease, or unspecified chronic kidney disease: Secondary | ICD-10-CM | POA: Diagnosis not present

## 2020-09-26 DIAGNOSIS — M5117 Intervertebral disc disorders with radiculopathy, lumbosacral region: Secondary | ICD-10-CM | POA: Diagnosis not present

## 2020-09-29 DIAGNOSIS — E1122 Type 2 diabetes mellitus with diabetic chronic kidney disease: Secondary | ICD-10-CM | POA: Diagnosis not present

## 2020-09-29 DIAGNOSIS — G8929 Other chronic pain: Secondary | ICD-10-CM | POA: Diagnosis not present

## 2020-09-29 DIAGNOSIS — M4726 Other spondylosis with radiculopathy, lumbar region: Secondary | ICD-10-CM | POA: Diagnosis not present

## 2020-09-29 DIAGNOSIS — I130 Hypertensive heart and chronic kidney disease with heart failure and stage 1 through stage 4 chronic kidney disease, or unspecified chronic kidney disease: Secondary | ICD-10-CM | POA: Diagnosis not present

## 2020-09-29 DIAGNOSIS — M5117 Intervertebral disc disorders with radiculopathy, lumbosacral region: Secondary | ICD-10-CM | POA: Diagnosis not present

## 2020-09-29 DIAGNOSIS — I5042 Chronic combined systolic (congestive) and diastolic (congestive) heart failure: Secondary | ICD-10-CM | POA: Diagnosis not present

## 2020-10-03 ENCOUNTER — Ambulatory Visit (INDEPENDENT_AMBULATORY_CARE_PROVIDER_SITE_OTHER): Payer: Medicare Other

## 2020-10-03 DIAGNOSIS — I639 Cerebral infarction, unspecified: Secondary | ICD-10-CM

## 2020-10-03 DIAGNOSIS — M4726 Other spondylosis with radiculopathy, lumbar region: Secondary | ICD-10-CM | POA: Diagnosis not present

## 2020-10-03 DIAGNOSIS — E1122 Type 2 diabetes mellitus with diabetic chronic kidney disease: Secondary | ICD-10-CM | POA: Diagnosis not present

## 2020-10-03 DIAGNOSIS — G8929 Other chronic pain: Secondary | ICD-10-CM | POA: Diagnosis not present

## 2020-10-03 DIAGNOSIS — I130 Hypertensive heart and chronic kidney disease with heart failure and stage 1 through stage 4 chronic kidney disease, or unspecified chronic kidney disease: Secondary | ICD-10-CM | POA: Diagnosis not present

## 2020-10-03 DIAGNOSIS — I5042 Chronic combined systolic (congestive) and diastolic (congestive) heart failure: Secondary | ICD-10-CM | POA: Diagnosis not present

## 2020-10-03 DIAGNOSIS — M5117 Intervertebral disc disorders with radiculopathy, lumbosacral region: Secondary | ICD-10-CM | POA: Diagnosis not present

## 2020-10-06 LAB — CUP PACEART REMOTE DEVICE CHECK
Date Time Interrogation Session: 20220630011512
Implantable Pulse Generator Implant Date: 20190429

## 2020-10-07 ENCOUNTER — Ambulatory Visit (INDEPENDENT_AMBULATORY_CARE_PROVIDER_SITE_OTHER): Payer: Medicare Other | Admitting: Family Medicine

## 2020-10-07 ENCOUNTER — Encounter: Payer: Self-pay | Admitting: Family Medicine

## 2020-10-07 ENCOUNTER — Other Ambulatory Visit: Payer: Self-pay

## 2020-10-07 VITALS — BP 122/62 | HR 74 | Ht 62.0 in | Wt 220.0 lb

## 2020-10-07 DIAGNOSIS — M5416 Radiculopathy, lumbar region: Secondary | ICD-10-CM | POA: Diagnosis not present

## 2020-10-07 DIAGNOSIS — M170 Bilateral primary osteoarthritis of knee: Secondary | ICD-10-CM

## 2020-10-07 DIAGNOSIS — M549 Dorsalgia, unspecified: Secondary | ICD-10-CM

## 2020-10-07 DIAGNOSIS — I639 Cerebral infarction, unspecified: Secondary | ICD-10-CM

## 2020-10-07 NOTE — Patient Instructions (Addendum)
Have your PCP for gabapentin  Epidural [phone] See me again in 2 months

## 2020-10-07 NOTE — Progress Notes (Signed)
[place] [facility] [place] Phone: [phone] Subjective:   [name], am serving as a scribe for Dr. Hulan Saas.  This visit occurred during the SARS-CoV-2 public health emergency.  Safety protocols were in place, including screening questions prior to the visit, additional usage of staff PPE, and extensive cleaning of exam room while observing appropriate contact time as indicated for disinfecting solutions.   I'm seeing this patient by the request  of:  Ann Held, DO  CC: Leg pain  [id number]  Erin [name] is a 75 y.o. female coming in with complaint of leg pain.  Patient does have some dementia at baseline.  Patient is accompanied with family member.  Patient also has had frequent falls recently chronic congestive heart failure as well as renal failure.  Patient saw our other sports provider in May 2022 and given bilateral knee injections at that time.  Last epidural of the back was July 31, 2020.  Patient as well as caregiver states that she has had more pain in L leg and lower back pain. Both knees have been popping with using stairs to get into the house. Patient was using gabapentin 300mg . No falls since last visit.     Past Medical History:  Diagnosis Date   Asthma    CHF (congestive heart failure) ([place])    hospital 11/2013-- [place]   Chronic kidney disease    Depression    Diabetes mellitus ([place])    GERD (gastroesophageal reflux disease)    Hyperlipidemia    Hypertension    Stroke [facility])    Past Surgical History:  Procedure Laterality Date   CHOLECYSTECTOMY  2002   LOOP RECORDER INSERTION N/A 08/01/2017   Procedure: LOOP RECORDER INSERTION;  Surgeon: Constance Haw, MD;  Location: [facility];  Service: Cardiovascular;  Laterality: N/A;   SPINE SURGERY  aug 2015   [facility]   TEE WITHOUT CARDIOVERSION N/A 08/01/2017   Procedure: TRANSESOPHAGEAL ECHOCARDIOGRAM  (TEE);  Surgeon: Sanda Klein, MD;  Location: [facility] ENDOSCOPY;  Service: Cardiovascular;  Laterality: N/A;   TONSILLECTOMY     TUBAL LIGATION     Social History   Socioeconomic History   Marital status: Widowed    Spouse name: Not on file   Number of children: 4   Years of education: 49   Highest education level: 12th grade  Occupational History   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, production: [employer]  Tobacco Use   Smoking status: Former    Packs/day: 1.50    Years: 20.00    Pack years: 30.00    Types: Cigarettes    Quit date: 08/02/1984    Years since quitting: 36.2   Smokeless tobacco: Never  Vaping Use   Vaping Use: Never used  Substance and Sexual Activity   Alcohol use: No   Drug use: No   Sexual activity: Not Currently    Partners: Male  Other Topics Concern   Not on file  Social History Narrative   NO REG EXERCISE   Caffeine use: daily   Social Determinants of Health   Financial Resource Strain: Low Risk    Difficulty of Paying Living Expenses: Not hard at all  Food Insecurity: No Food Insecurity   Worried About Charity fundraiser in the Last Year: Never true   Ran Out of Food in the Last Year: Never true  Transportation Needs: No Transportation Needs  Lack of Transportation (Medical): No   Lack of Transportation (Non-Medical): No  Physical Activity: Inactive   Days of Exercise per Week: 0 days   Minutes of Exercise per Session: 0 min  Stress: No Stress Concern Present   Feeling of Stress : Only a little  Social Connections: Socially Isolated   Frequency of Communication with Friends and Family: More than three times a week   Frequency of Social Gatherings with Friends and Family: More than three times a week   Attends Religious Services: Never   Marine scientist or Organizations: No   Attends Archivist Meetings: Never   Marital Status: Widowed   Allergies  Allergen Reactions   Bee Venom    Family History  Problem Relation  Age of Onset   Hypertension Mother    Alzheimer's disease Mother    Hyperlipidemia Mother    Heart disease Father        cad   Asthma Father    Cancer Father 42       leukemia   Stroke Father    Hypertension Father    Heart disease Sister 4       MI   Pulmonary fibrosis Sister    Arthritis Brother    Heart disease Maternal Uncle    Stroke Maternal Uncle    Uterine cancer Paternal Grandmother    Stomach cancer Paternal Grandfather    Heart disease Maternal Uncle    Heart disease Maternal Uncle    Cancer Maternal Aunt        leukemia    Current Outpatient Medications (Endocrine & Metabolic):    Insulin Glargine (BASAGLAR KWIKPEN) 100 UNIT/ML, INJECT 0.4 MLS (40 UNITS TOTAL) INTO THE SKIN AT BEDTIME.  Current Outpatient Medications (Cardiovascular):    atorvastatin (LIPITOR) 40 MG tablet, TAKE 1 TABLET (40 MG TOTAL) BY MOUTH DAILY. PT NEEDS OV FOR FURTHER REFILLS   carvedilol (COREG) 6.25 MG tablet, TAKE 1.5 TABLETS (9.375 MG TOTAL) BY MOUTH 2 (TWO) TIMES DAILY WITH A MEAL.   EPINEPHrine 0.3 mg/0.3 mL IJ SOAJ injection, PLEASE SEE ATTACHED FOR DETAILED DIRECTIONS   fenofibrate micronized (LOFIBRA) 200 MG capsule, Take 1 capsule (200 mg total) by mouth daily. Pt need OV for further refills   torsemide (DEMADEX) 20 MG tablet, TAKE FOUR TABLETS BY MOUTH TWICE DAILY  Current Outpatient Medications (Respiratory):    albuterol (PROVENTIL) (2.5 MG/3ML) 0.083% nebulizer solution, Take 3 mLs (2.5 mg total) by nebulization every 6 (six) hours as needed for wheezing or shortness of breath.   albuterol (VENTOLIN HFA) 108 (90 Base) MCG/ACT inhaler, Inhale 2 puffs into the lungs every 4 (four) hours as needed for wheezing or shortness of breath.    Fluticasone-Salmeterol (ADVAIR) 250-50 MCG/DOSE AEPB, Inhale 1 puff into the lungs 2 (two) times daily.  Current Outpatient Medications (Analgesics):    Acetaminophen (TYLENOL 8 HOUR PO), Take 500 mg by mouth. (Patient not taking: No sig  reported)   allopurinol (ZYLOPRIM) 300 MG tablet, Take 1 tablet (300 mg total) by mouth daily.   HYDROcodone-acetaminophen (NORCO/VICODIN) 5-325 MG tablet, Take 1 tablet by mouth every 4 (four) hours as needed for moderate pain.  Current Outpatient Medications (Hematological):    clopidogrel (PLAVIX) 75 MG tablet, Take 1 tablet (75 mg total) by mouth daily.   ferrous sulfate 325 (65 FE) MG tablet, Take 325 mg by mouth.  Current Outpatient Medications (Other):    AMBULATORY NON FORMULARY MEDICATION, Rollator   BD PEN NEEDLE NANO 2ND GEN  32G X 4 MM MISC, USE 4 TIMES A DAY   busPIRone (BUSPAR) 15 MG tablet, Take 1 tablet (15 mg total) by mouth 3 (three) times daily. Pt needs OV for further refills   diclofenac sodium (VOLTAREN) 1 % GEL, Apply 4 g topically 4 (four) times daily.   ergocalciferol (VITAMIN D2) 1.25 MG (50000 UT) capsule, Take 1 capsule (50,000 Units total) by mouth once a week. Wednesday   gabapentin (NEURONTIN) 300 MG capsule, Take 1 capsule (300 mg total) by mouth at bedtime.   glucose blood test strip, Use 3x a day - One TOUch Ultra   pantoprazole (PROTONIX) 40 MG tablet, Take 1 tablet (40 mg total) by mouth daily.   pramipexole (MIRAPEX) 1 MG tablet, TAKE 1 TABLET BY MOUTH AT BEDTIME.   sertraline (ZOLOFT) 50 MG tablet, TAKE 1 AND 1/2 TABLETS DAILY BY MOUTH   Reviewed prior external information including notes and imaging from  primary care provider As well as notes that were available from care everywhere and other healthcare systems.  Past medical history, social, surgical and family history all reviewed in electronic medical record.  No pertanent information unless stated regarding to the chief complaint.   Review of Systems:  No headache, visual changes, nausea, vomiting, diarrhea, constipation, dizziness, abdominal pain, skin rash, fevers, chills, night sweats, weight loss, swollen lymph nodes,  chest pain, shortness of breath, mood changes. POSITIVE muscle aches,  lower body swelling, joint swelling, body aches  Objective  Blood pressure 122/62, pulse 74, height 5\' 2"  (1.575 m), weight 220 lb (99.8 kg), SpO2 98 %.   General: No apparent distress alert but not oriented.  Continues to be anxious at baseline. HEENT: Pupils equal, extraocular movements intact  Respiratory: Patient's speak in full sentences and does not appear short of breath  Cardiovascular: Hemosiderin deposits noted of lower extremities bilaterally.  2+ pitting edema two thirds of the way up to the tibia. Gait significantly antalgic using a walker. Patient difficult to do full knee and assessment with patient having pain to even light palpation.  Patient does have limited range of motion noted.  Tenderness diffusely of the knees to even light palpation.  After informed written and verbal consent, patient was seated on exam table. Right knee was prepped with alcohol swab and utilizing anterolateral approach, patient's right knee space was injected with 4:1  marcaine 0.5%: Kenalog 40mg /dL. Patient tolerated the procedure well without immediate complications.  After informed written and verbal consent, patient was seated on exam table. Left knee was prepped with alcohol swab and utilizing anterolateral approach, patient's left knee space was injected with 4:1  marcaine 0.5%: Kenalog 40mg /dL. Patient tolerated the procedure well without immediate complications.   Impression and Recommendations:     The above documentation has been reviewed and is accurate and complete Lyndal Pulley, DO

## 2020-10-07 NOTE — Assessment & Plan Note (Signed)
Repeat injection given again today.  Tolerated the procedure well.  We will see if we can get approval for viscosupplementation which patient has had some improvement previously.  Discussed posture and ergonomics.  Discussed with patient's caregiver that this is a long process.  I do feel that some of the lumbar spine could be contributing overall and we will try another epidural as well.  Follow-up with me again in 8 weeks.

## 2020-10-07 NOTE — Assessment & Plan Note (Signed)
Patient has had back pain with radiation.  Patient encouraged to try another epidural.  Has responded to it previously.  Patient is on a blood thinner and will work this out accordingly.  Patient will follow up with me again in 8 to 10 weeks otherwise.  Patient as well as primary caregiver is in approval with this type of treatment.

## 2020-10-09 DIAGNOSIS — M5117 Intervertebral disc disorders with radiculopathy, lumbosacral region: Secondary | ICD-10-CM | POA: Diagnosis not present

## 2020-10-09 DIAGNOSIS — I130 Hypertensive heart and chronic kidney disease with heart failure and stage 1 through stage 4 chronic kidney disease, or unspecified chronic kidney disease: Secondary | ICD-10-CM | POA: Diagnosis not present

## 2020-10-09 DIAGNOSIS — I5042 Chronic combined systolic (congestive) and diastolic (congestive) heart failure: Secondary | ICD-10-CM | POA: Diagnosis not present

## 2020-10-09 DIAGNOSIS — M4726 Other spondylosis with radiculopathy, lumbar region: Secondary | ICD-10-CM | POA: Diagnosis not present

## 2020-10-09 DIAGNOSIS — E1122 Type 2 diabetes mellitus with diabetic chronic kidney disease: Secondary | ICD-10-CM | POA: Diagnosis not present

## 2020-10-09 DIAGNOSIS — G8929 Other chronic pain: Secondary | ICD-10-CM | POA: Diagnosis not present

## 2020-10-10 DIAGNOSIS — G8929 Other chronic pain: Secondary | ICD-10-CM | POA: Diagnosis not present

## 2020-10-10 DIAGNOSIS — M4726 Other spondylosis with radiculopathy, lumbar region: Secondary | ICD-10-CM | POA: Diagnosis not present

## 2020-10-10 DIAGNOSIS — I5042 Chronic combined systolic (congestive) and diastolic (congestive) heart failure: Secondary | ICD-10-CM | POA: Diagnosis not present

## 2020-10-10 DIAGNOSIS — I130 Hypertensive heart and chronic kidney disease with heart failure and stage 1 through stage 4 chronic kidney disease, or unspecified chronic kidney disease: Secondary | ICD-10-CM | POA: Diagnosis not present

## 2020-10-10 DIAGNOSIS — M5117 Intervertebral disc disorders with radiculopathy, lumbosacral region: Secondary | ICD-10-CM | POA: Diagnosis not present

## 2020-10-10 DIAGNOSIS — E1122 Type 2 diabetes mellitus with diabetic chronic kidney disease: Secondary | ICD-10-CM | POA: Diagnosis not present

## 2020-10-12 ENCOUNTER — Other Ambulatory Visit: Payer: Self-pay | Admitting: Family Medicine

## 2020-10-12 DIAGNOSIS — K219 Gastro-esophageal reflux disease without esophagitis: Secondary | ICD-10-CM

## 2020-10-14 DIAGNOSIS — E1122 Type 2 diabetes mellitus with diabetic chronic kidney disease: Secondary | ICD-10-CM | POA: Diagnosis not present

## 2020-10-14 DIAGNOSIS — G8929 Other chronic pain: Secondary | ICD-10-CM | POA: Diagnosis not present

## 2020-10-14 DIAGNOSIS — M5117 Intervertebral disc disorders with radiculopathy, lumbosacral region: Secondary | ICD-10-CM | POA: Diagnosis not present

## 2020-10-14 DIAGNOSIS — I5042 Chronic combined systolic (congestive) and diastolic (congestive) heart failure: Secondary | ICD-10-CM | POA: Diagnosis not present

## 2020-10-14 DIAGNOSIS — I130 Hypertensive heart and chronic kidney disease with heart failure and stage 1 through stage 4 chronic kidney disease, or unspecified chronic kidney disease: Secondary | ICD-10-CM | POA: Diagnosis not present

## 2020-10-14 DIAGNOSIS — M4726 Other spondylosis with radiculopathy, lumbar region: Secondary | ICD-10-CM | POA: Diagnosis not present

## 2020-10-20 DIAGNOSIS — G8929 Other chronic pain: Secondary | ICD-10-CM | POA: Diagnosis not present

## 2020-10-20 DIAGNOSIS — E1122 Type 2 diabetes mellitus with diabetic chronic kidney disease: Secondary | ICD-10-CM | POA: Diagnosis not present

## 2020-10-20 DIAGNOSIS — M5117 Intervertebral disc disorders with radiculopathy, lumbosacral region: Secondary | ICD-10-CM | POA: Diagnosis not present

## 2020-10-20 DIAGNOSIS — I5042 Chronic combined systolic (congestive) and diastolic (congestive) heart failure: Secondary | ICD-10-CM | POA: Diagnosis not present

## 2020-10-20 DIAGNOSIS — I130 Hypertensive heart and chronic kidney disease with heart failure and stage 1 through stage 4 chronic kidney disease, or unspecified chronic kidney disease: Secondary | ICD-10-CM | POA: Diagnosis not present

## 2020-10-20 DIAGNOSIS — M4726 Other spondylosis with radiculopathy, lumbar region: Secondary | ICD-10-CM | POA: Diagnosis not present

## 2020-10-22 DIAGNOSIS — I130 Hypertensive heart and chronic kidney disease with heart failure and stage 1 through stage 4 chronic kidney disease, or unspecified chronic kidney disease: Secondary | ICD-10-CM | POA: Diagnosis not present

## 2020-10-22 DIAGNOSIS — M4726 Other spondylosis with radiculopathy, lumbar region: Secondary | ICD-10-CM | POA: Diagnosis not present

## 2020-10-22 DIAGNOSIS — E1122 Type 2 diabetes mellitus with diabetic chronic kidney disease: Secondary | ICD-10-CM | POA: Diagnosis not present

## 2020-10-22 DIAGNOSIS — G8929 Other chronic pain: Secondary | ICD-10-CM | POA: Diagnosis not present

## 2020-10-22 DIAGNOSIS — M5117 Intervertebral disc disorders with radiculopathy, lumbosacral region: Secondary | ICD-10-CM | POA: Diagnosis not present

## 2020-10-22 DIAGNOSIS — I5042 Chronic combined systolic (congestive) and diastolic (congestive) heart failure: Secondary | ICD-10-CM | POA: Diagnosis not present

## 2020-10-22 NOTE — Progress Notes (Signed)
Carelink Summary Report / Loop Recorder 

## 2020-10-23 DIAGNOSIS — G8929 Other chronic pain: Secondary | ICD-10-CM | POA: Diagnosis not present

## 2020-10-23 DIAGNOSIS — F329 Major depressive disorder, single episode, unspecified: Secondary | ICD-10-CM | POA: Diagnosis not present

## 2020-10-23 DIAGNOSIS — E1122 Type 2 diabetes mellitus with diabetic chronic kidney disease: Secondary | ICD-10-CM | POA: Diagnosis not present

## 2020-10-23 DIAGNOSIS — G4733 Obstructive sleep apnea (adult) (pediatric): Secondary | ICD-10-CM | POA: Diagnosis not present

## 2020-10-23 DIAGNOSIS — R41841 Cognitive communication deficit: Secondary | ICD-10-CM | POA: Diagnosis not present

## 2020-10-23 DIAGNOSIS — F419 Anxiety disorder, unspecified: Secondary | ICD-10-CM | POA: Diagnosis not present

## 2020-10-23 DIAGNOSIS — M4726 Other spondylosis with radiculopathy, lumbar region: Secondary | ICD-10-CM | POA: Diagnosis not present

## 2020-10-23 DIAGNOSIS — D631 Anemia in chronic kidney disease: Secondary | ICD-10-CM | POA: Diagnosis not present

## 2020-10-23 DIAGNOSIS — E1151 Type 2 diabetes mellitus with diabetic peripheral angiopathy without gangrene: Secondary | ICD-10-CM | POA: Diagnosis not present

## 2020-10-23 DIAGNOSIS — M47812 Spondylosis without myelopathy or radiculopathy, cervical region: Secondary | ICD-10-CM | POA: Diagnosis not present

## 2020-10-23 DIAGNOSIS — D696 Thrombocytopenia, unspecified: Secondary | ICD-10-CM | POA: Diagnosis not present

## 2020-10-23 DIAGNOSIS — J45909 Unspecified asthma, uncomplicated: Secondary | ICD-10-CM | POA: Diagnosis not present

## 2020-10-23 DIAGNOSIS — I69320 Aphasia following cerebral infarction: Secondary | ICD-10-CM | POA: Diagnosis not present

## 2020-10-23 DIAGNOSIS — M5117 Intervertebral disc disorders with radiculopathy, lumbosacral region: Secondary | ICD-10-CM | POA: Diagnosis not present

## 2020-10-23 DIAGNOSIS — E785 Hyperlipidemia, unspecified: Secondary | ICD-10-CM | POA: Diagnosis not present

## 2020-10-23 DIAGNOSIS — Z794 Long term (current) use of insulin: Secondary | ICD-10-CM | POA: Diagnosis not present

## 2020-10-23 DIAGNOSIS — K5900 Constipation, unspecified: Secondary | ICD-10-CM | POA: Diagnosis not present

## 2020-10-23 DIAGNOSIS — I5042 Chronic combined systolic (congestive) and diastolic (congestive) heart failure: Secondary | ICD-10-CM | POA: Diagnosis not present

## 2020-10-23 DIAGNOSIS — E559 Vitamin D deficiency, unspecified: Secondary | ICD-10-CM | POA: Diagnosis not present

## 2020-10-23 DIAGNOSIS — I130 Hypertensive heart and chronic kidney disease with heart failure and stage 1 through stage 4 chronic kidney disease, or unspecified chronic kidney disease: Secondary | ICD-10-CM | POA: Diagnosis not present

## 2020-10-23 DIAGNOSIS — Z9181 History of falling: Secondary | ICD-10-CM | POA: Diagnosis not present

## 2020-10-23 DIAGNOSIS — N183 Chronic kidney disease, stage 3 unspecified: Secondary | ICD-10-CM | POA: Diagnosis not present

## 2020-10-23 DIAGNOSIS — M1030 Gout due to renal impairment, unspecified site: Secondary | ICD-10-CM | POA: Diagnosis not present

## 2020-10-23 DIAGNOSIS — Z981 Arthrodesis status: Secondary | ICD-10-CM | POA: Diagnosis not present

## 2020-10-23 DIAGNOSIS — K219 Gastro-esophageal reflux disease without esophagitis: Secondary | ICD-10-CM | POA: Diagnosis not present

## 2020-10-27 DIAGNOSIS — G8929 Other chronic pain: Secondary | ICD-10-CM | POA: Diagnosis not present

## 2020-10-27 DIAGNOSIS — M4726 Other spondylosis with radiculopathy, lumbar region: Secondary | ICD-10-CM | POA: Diagnosis not present

## 2020-10-27 DIAGNOSIS — I130 Hypertensive heart and chronic kidney disease with heart failure and stage 1 through stage 4 chronic kidney disease, or unspecified chronic kidney disease: Secondary | ICD-10-CM | POA: Diagnosis not present

## 2020-10-27 DIAGNOSIS — M5117 Intervertebral disc disorders with radiculopathy, lumbosacral region: Secondary | ICD-10-CM | POA: Diagnosis not present

## 2020-10-27 DIAGNOSIS — I5042 Chronic combined systolic (congestive) and diastolic (congestive) heart failure: Secondary | ICD-10-CM | POA: Diagnosis not present

## 2020-10-27 DIAGNOSIS — E1122 Type 2 diabetes mellitus with diabetic chronic kidney disease: Secondary | ICD-10-CM | POA: Diagnosis not present

## 2020-10-28 ENCOUNTER — Ambulatory Visit
Admission: RE | Admit: 2020-10-28 | Discharge: 2020-10-28 | Disposition: A | Discharge status: Home / Self Care | Payer: Medicare Other | Source: Ambulatory Visit | Attending: Family Medicine | Admitting: Family Medicine

## 2020-10-28 ENCOUNTER — Other Ambulatory Visit: Payer: Self-pay | Admitting: Family Medicine

## 2020-10-28 ENCOUNTER — Other Ambulatory Visit: Payer: Self-pay

## 2020-10-28 DIAGNOSIS — M5416 Radiculopathy, lumbar region: Secondary | ICD-10-CM

## 2020-10-28 DIAGNOSIS — M47817 Spondylosis without myelopathy or radiculopathy, lumbosacral region: Secondary | ICD-10-CM | POA: Diagnosis not present

## 2020-10-28 MED ORDER — IOPAMIDOL (ISOVUE-M 200) INJECTION 41%
1.0000 mL | Freq: Once | INTRAMUSCULAR | Status: AC
  Administered 2020-10-28: 1 mL via EPIDURAL

## 2020-10-28 MED ORDER — METHYLPREDNISOLONE ACETATE 40 MG/ML INJ SUSP (RADIOLOG
80.0000 mg | Freq: Once | INTRAMUSCULAR | Status: AC
  Administered 2020-10-28: 80 mg via EPIDURAL

## 2020-10-28 NOTE — Discharge Instructions (Signed)

## 2020-10-30 DIAGNOSIS — M4726 Other spondylosis with radiculopathy, lumbar region: Secondary | ICD-10-CM | POA: Diagnosis not present

## 2020-10-30 DIAGNOSIS — E1122 Type 2 diabetes mellitus with diabetic chronic kidney disease: Secondary | ICD-10-CM | POA: Diagnosis not present

## 2020-10-30 DIAGNOSIS — G8929 Other chronic pain: Secondary | ICD-10-CM | POA: Diagnosis not present

## 2020-10-30 DIAGNOSIS — I130 Hypertensive heart and chronic kidney disease with heart failure and stage 1 through stage 4 chronic kidney disease, or unspecified chronic kidney disease: Secondary | ICD-10-CM | POA: Diagnosis not present

## 2020-10-30 DIAGNOSIS — I5042 Chronic combined systolic (congestive) and diastolic (congestive) heart failure: Secondary | ICD-10-CM | POA: Diagnosis not present

## 2020-10-30 DIAGNOSIS — M5117 Intervertebral disc disorders with radiculopathy, lumbosacral region: Secondary | ICD-10-CM | POA: Diagnosis not present

## 2020-11-03 DIAGNOSIS — E1122 Type 2 diabetes mellitus with diabetic chronic kidney disease: Secondary | ICD-10-CM | POA: Diagnosis not present

## 2020-11-03 DIAGNOSIS — M5117 Intervertebral disc disorders with radiculopathy, lumbosacral region: Secondary | ICD-10-CM | POA: Diagnosis not present

## 2020-11-03 DIAGNOSIS — I5042 Chronic combined systolic (congestive) and diastolic (congestive) heart failure: Secondary | ICD-10-CM | POA: Diagnosis not present

## 2020-11-03 DIAGNOSIS — G8929 Other chronic pain: Secondary | ICD-10-CM | POA: Diagnosis not present

## 2020-11-03 DIAGNOSIS — I130 Hypertensive heart and chronic kidney disease with heart failure and stage 1 through stage 4 chronic kidney disease, or unspecified chronic kidney disease: Secondary | ICD-10-CM | POA: Diagnosis not present

## 2020-11-03 DIAGNOSIS — M4726 Other spondylosis with radiculopathy, lumbar region: Secondary | ICD-10-CM | POA: Diagnosis not present

## 2020-11-04 LAB — CUP PACEART REMOTE DEVICE CHECK
Date Time Interrogation Session: 20220802011949
Implantable Pulse Generator Implant Date: 20190429

## 2020-11-05 ENCOUNTER — Ambulatory Visit (INDEPENDENT_AMBULATORY_CARE_PROVIDER_SITE_OTHER): Payer: Medicare Other

## 2020-11-05 DIAGNOSIS — E1122 Type 2 diabetes mellitus with diabetic chronic kidney disease: Secondary | ICD-10-CM | POA: Diagnosis not present

## 2020-11-05 DIAGNOSIS — I639 Cerebral infarction, unspecified: Secondary | ICD-10-CM

## 2020-11-05 DIAGNOSIS — M4726 Other spondylosis with radiculopathy, lumbar region: Secondary | ICD-10-CM | POA: Diagnosis not present

## 2020-11-05 DIAGNOSIS — I5042 Chronic combined systolic (congestive) and diastolic (congestive) heart failure: Secondary | ICD-10-CM | POA: Diagnosis not present

## 2020-11-05 DIAGNOSIS — G8929 Other chronic pain: Secondary | ICD-10-CM | POA: Diagnosis not present

## 2020-11-05 DIAGNOSIS — M5117 Intervertebral disc disorders with radiculopathy, lumbosacral region: Secondary | ICD-10-CM | POA: Diagnosis not present

## 2020-11-05 DIAGNOSIS — I130 Hypertensive heart and chronic kidney disease with heart failure and stage 1 through stage 4 chronic kidney disease, or unspecified chronic kidney disease: Secondary | ICD-10-CM | POA: Diagnosis not present

## 2020-11-11 ENCOUNTER — Other Ambulatory Visit: Payer: Self-pay | Admitting: Family Medicine

## 2020-11-13 ENCOUNTER — Ambulatory Visit: Payer: Medicare Other | Admitting: Family Medicine

## 2020-11-13 DIAGNOSIS — I5042 Chronic combined systolic (congestive) and diastolic (congestive) heart failure: Secondary | ICD-10-CM | POA: Diagnosis not present

## 2020-11-13 DIAGNOSIS — E1122 Type 2 diabetes mellitus with diabetic chronic kidney disease: Secondary | ICD-10-CM | POA: Diagnosis not present

## 2020-11-13 DIAGNOSIS — M5117 Intervertebral disc disorders with radiculopathy, lumbosacral region: Secondary | ICD-10-CM | POA: Diagnosis not present

## 2020-11-13 DIAGNOSIS — I130 Hypertensive heart and chronic kidney disease with heart failure and stage 1 through stage 4 chronic kidney disease, or unspecified chronic kidney disease: Secondary | ICD-10-CM | POA: Diagnosis not present

## 2020-11-13 DIAGNOSIS — M4726 Other spondylosis with radiculopathy, lumbar region: Secondary | ICD-10-CM | POA: Diagnosis not present

## 2020-11-13 DIAGNOSIS — G8929 Other chronic pain: Secondary | ICD-10-CM | POA: Diagnosis not present

## 2020-11-14 ENCOUNTER — Other Ambulatory Visit: Payer: Self-pay | Admitting: Family Medicine

## 2020-11-14 ENCOUNTER — Encounter: Payer: Self-pay | Admitting: Family Medicine

## 2020-11-14 DIAGNOSIS — G8929 Other chronic pain: Secondary | ICD-10-CM | POA: Diagnosis not present

## 2020-11-14 DIAGNOSIS — E1122 Type 2 diabetes mellitus with diabetic chronic kidney disease: Secondary | ICD-10-CM | POA: Diagnosis not present

## 2020-11-14 DIAGNOSIS — I130 Hypertensive heart and chronic kidney disease with heart failure and stage 1 through stage 4 chronic kidney disease, or unspecified chronic kidney disease: Secondary | ICD-10-CM | POA: Diagnosis not present

## 2020-11-14 DIAGNOSIS — M5117 Intervertebral disc disorders with radiculopathy, lumbosacral region: Secondary | ICD-10-CM | POA: Diagnosis not present

## 2020-11-14 DIAGNOSIS — M4726 Other spondylosis with radiculopathy, lumbar region: Secondary | ICD-10-CM | POA: Diagnosis not present

## 2020-11-14 DIAGNOSIS — I5042 Chronic combined systolic (congestive) and diastolic (congestive) heart failure: Secondary | ICD-10-CM | POA: Diagnosis not present

## 2020-11-14 MED ORDER — ALLOPURINOL 300 MG PO TABS: 300.0000 mg | ORAL_TABLET | Freq: Every day | ORAL | 3 refills | Status: DC

## 2020-11-18 DIAGNOSIS — M4726 Other spondylosis with radiculopathy, lumbar region: Secondary | ICD-10-CM | POA: Diagnosis not present

## 2020-11-18 DIAGNOSIS — G8929 Other chronic pain: Secondary | ICD-10-CM | POA: Diagnosis not present

## 2020-11-18 DIAGNOSIS — I5042 Chronic combined systolic (congestive) and diastolic (congestive) heart failure: Secondary | ICD-10-CM | POA: Diagnosis not present

## 2020-11-18 DIAGNOSIS — I130 Hypertensive heart and chronic kidney disease with heart failure and stage 1 through stage 4 chronic kidney disease, or unspecified chronic kidney disease: Secondary | ICD-10-CM | POA: Diagnosis not present

## 2020-11-18 DIAGNOSIS — M5117 Intervertebral disc disorders with radiculopathy, lumbosacral region: Secondary | ICD-10-CM | POA: Diagnosis not present

## 2020-11-18 DIAGNOSIS — E1122 Type 2 diabetes mellitus with diabetic chronic kidney disease: Secondary | ICD-10-CM | POA: Diagnosis not present

## 2020-11-20 ENCOUNTER — Encounter: Payer: Self-pay | Admitting: Family Medicine

## 2020-11-21 ENCOUNTER — Telehealth: Payer: Self-pay | Admitting: Family Medicine

## 2020-11-21 DIAGNOSIS — I5042 Chronic combined systolic (congestive) and diastolic (congestive) heart failure: Secondary | ICD-10-CM | POA: Diagnosis not present

## 2020-11-21 DIAGNOSIS — G8929 Other chronic pain: Secondary | ICD-10-CM | POA: Diagnosis not present

## 2020-11-21 DIAGNOSIS — M4726 Other spondylosis with radiculopathy, lumbar region: Secondary | ICD-10-CM | POA: Diagnosis not present

## 2020-11-21 DIAGNOSIS — E1122 Type 2 diabetes mellitus with diabetic chronic kidney disease: Secondary | ICD-10-CM | POA: Diagnosis not present

## 2020-11-21 DIAGNOSIS — I130 Hypertensive heart and chronic kidney disease with heart failure and stage 1 through stage 4 chronic kidney disease, or unspecified chronic kidney disease: Secondary | ICD-10-CM | POA: Diagnosis not present

## 2020-11-21 DIAGNOSIS — M5117 Intervertebral disc disorders with radiculopathy, lumbosacral region: Secondary | ICD-10-CM | POA: Diagnosis not present

## 2020-11-21 NOTE — Telephone Encounter (Signed)
Pt has a appointment on 11/27/20. Please advise

## 2020-11-21 NOTE — Telephone Encounter (Signed)
Caller/Agency: [facility] Number: [phone]  Requesting OT/PT/Skilled Nursing/Social Work/Speech Therapy: Nursing  Frequency:  Nursing for left lower ext right  lower leg wants to get a nurse to take a look at some weeping from leg

## 2020-11-21 NOTE — Telephone Encounter (Signed)
Verbal given 

## 2020-11-22 DIAGNOSIS — N183 Chronic kidney disease, stage 3 unspecified: Secondary | ICD-10-CM | POA: Diagnosis not present

## 2020-11-22 DIAGNOSIS — M1030 Gout due to renal impairment, unspecified site: Secondary | ICD-10-CM | POA: Diagnosis not present

## 2020-11-22 DIAGNOSIS — I69320 Aphasia following cerebral infarction: Secondary | ICD-10-CM | POA: Diagnosis not present

## 2020-11-22 DIAGNOSIS — J45909 Unspecified asthma, uncomplicated: Secondary | ICD-10-CM | POA: Diagnosis not present

## 2020-11-22 DIAGNOSIS — E785 Hyperlipidemia, unspecified: Secondary | ICD-10-CM | POA: Diagnosis not present

## 2020-11-22 DIAGNOSIS — M47812 Spondylosis without myelopathy or radiculopathy, cervical region: Secondary | ICD-10-CM | POA: Diagnosis not present

## 2020-11-22 DIAGNOSIS — I5042 Chronic combined systolic (congestive) and diastolic (congestive) heart failure: Secondary | ICD-10-CM | POA: Diagnosis not present

## 2020-11-22 DIAGNOSIS — D696 Thrombocytopenia, unspecified: Secondary | ICD-10-CM | POA: Diagnosis not present

## 2020-11-22 DIAGNOSIS — G8929 Other chronic pain: Secondary | ICD-10-CM | POA: Diagnosis not present

## 2020-11-22 DIAGNOSIS — F329 Major depressive disorder, single episode, unspecified: Secondary | ICD-10-CM | POA: Diagnosis not present

## 2020-11-22 DIAGNOSIS — E1122 Type 2 diabetes mellitus with diabetic chronic kidney disease: Secondary | ICD-10-CM | POA: Diagnosis not present

## 2020-11-22 DIAGNOSIS — Z9181 History of falling: Secondary | ICD-10-CM | POA: Diagnosis not present

## 2020-11-22 DIAGNOSIS — K5900 Constipation, unspecified: Secondary | ICD-10-CM | POA: Diagnosis not present

## 2020-11-22 DIAGNOSIS — Z794 Long term (current) use of insulin: Secondary | ICD-10-CM | POA: Diagnosis not present

## 2020-11-22 DIAGNOSIS — D631 Anemia in chronic kidney disease: Secondary | ICD-10-CM | POA: Diagnosis not present

## 2020-11-22 DIAGNOSIS — Z981 Arthrodesis status: Secondary | ICD-10-CM | POA: Diagnosis not present

## 2020-11-22 DIAGNOSIS — K219 Gastro-esophageal reflux disease without esophagitis: Secondary | ICD-10-CM | POA: Diagnosis not present

## 2020-11-22 DIAGNOSIS — R41841 Cognitive communication deficit: Secondary | ICD-10-CM | POA: Diagnosis not present

## 2020-11-22 DIAGNOSIS — M4726 Other spondylosis with radiculopathy, lumbar region: Secondary | ICD-10-CM | POA: Diagnosis not present

## 2020-11-22 DIAGNOSIS — F419 Anxiety disorder, unspecified: Secondary | ICD-10-CM | POA: Diagnosis not present

## 2020-11-22 DIAGNOSIS — M5117 Intervertebral disc disorders with radiculopathy, lumbosacral region: Secondary | ICD-10-CM | POA: Diagnosis not present

## 2020-11-22 DIAGNOSIS — G4733 Obstructive sleep apnea (adult) (pediatric): Secondary | ICD-10-CM | POA: Diagnosis not present

## 2020-11-22 DIAGNOSIS — E1151 Type 2 diabetes mellitus with diabetic peripheral angiopathy without gangrene: Secondary | ICD-10-CM | POA: Diagnosis not present

## 2020-11-22 DIAGNOSIS — I130 Hypertensive heart and chronic kidney disease with heart failure and stage 1 through stage 4 chronic kidney disease, or unspecified chronic kidney disease: Secondary | ICD-10-CM | POA: Diagnosis not present

## 2020-11-22 DIAGNOSIS — E559 Vitamin D deficiency, unspecified: Secondary | ICD-10-CM | POA: Diagnosis not present

## 2020-11-25 DIAGNOSIS — M4726 Other spondylosis with radiculopathy, lumbar region: Secondary | ICD-10-CM | POA: Diagnosis not present

## 2020-11-25 DIAGNOSIS — I130 Hypertensive heart and chronic kidney disease with heart failure and stage 1 through stage 4 chronic kidney disease, or unspecified chronic kidney disease: Secondary | ICD-10-CM | POA: Diagnosis not present

## 2020-11-25 DIAGNOSIS — E1122 Type 2 diabetes mellitus with diabetic chronic kidney disease: Secondary | ICD-10-CM | POA: Diagnosis not present

## 2020-11-25 DIAGNOSIS — M5117 Intervertebral disc disorders with radiculopathy, lumbosacral region: Secondary | ICD-10-CM | POA: Diagnosis not present

## 2020-11-25 DIAGNOSIS — I5042 Chronic combined systolic (congestive) and diastolic (congestive) heart failure: Secondary | ICD-10-CM | POA: Diagnosis not present

## 2020-11-25 DIAGNOSIS — G8929 Other chronic pain: Secondary | ICD-10-CM | POA: Diagnosis not present

## 2020-11-27 ENCOUNTER — Ambulatory Visit (HOSPITAL_BASED_OUTPATIENT_CLINIC_OR_DEPARTMENT_OTHER)
Admission: RE | Admit: 2020-11-27 | Discharge: 2020-11-27 | Disposition: A | Discharge status: Home / Self Care | Payer: Medicare Other | Source: Ambulatory Visit | Attending: Family Medicine | Admitting: Family Medicine

## 2020-11-27 ENCOUNTER — Encounter: Payer: Self-pay | Admitting: Family Medicine

## 2020-11-27 ENCOUNTER — Telehealth: Payer: Self-pay | Admitting: Family Medicine

## 2020-11-27 ENCOUNTER — Ambulatory Visit (INDEPENDENT_AMBULATORY_CARE_PROVIDER_SITE_OTHER): Payer: Medicare Other | Admitting: Family Medicine

## 2020-11-27 ENCOUNTER — Other Ambulatory Visit: Payer: Self-pay

## 2020-11-27 VITALS — BP 160/120 | HR 96 | Temp 99.1°F | Resp 20

## 2020-11-27 DIAGNOSIS — M25461 Effusion, right knee: Secondary | ICD-10-CM | POA: Diagnosis not present

## 2020-11-27 DIAGNOSIS — M7989 Other specified soft tissue disorders: Secondary | ICD-10-CM | POA: Diagnosis not present

## 2020-11-27 DIAGNOSIS — R0902 Hypoxemia: Secondary | ICD-10-CM | POA: Diagnosis not present

## 2020-11-27 DIAGNOSIS — Z20822 Contact with and (suspected) exposure to covid-19: Secondary | ICD-10-CM | POA: Diagnosis present

## 2020-11-27 DIAGNOSIS — Z9049 Acquired absence of other specified parts of digestive tract: Secondary | ICD-10-CM | POA: Diagnosis not present

## 2020-11-27 DIAGNOSIS — D631 Anemia in chronic kidney disease: Secondary | ICD-10-CM | POA: Diagnosis present

## 2020-11-27 DIAGNOSIS — Z8739 Personal history of other diseases of the musculoskeletal system and connective tissue: Secondary | ICD-10-CM

## 2020-11-27 DIAGNOSIS — I10 Essential (primary) hypertension: Secondary | ICD-10-CM | POA: Diagnosis not present

## 2020-11-27 DIAGNOSIS — M545 Low back pain, unspecified: Secondary | ICD-10-CM | POA: Diagnosis not present

## 2020-11-27 DIAGNOSIS — R072 Precordial pain: Secondary | ICD-10-CM | POA: Diagnosis not present

## 2020-11-27 DIAGNOSIS — I639 Cerebral infarction, unspecified: Secondary | ICD-10-CM | POA: Diagnosis not present

## 2020-11-27 DIAGNOSIS — L03115 Cellulitis of right lower limb: Secondary | ICD-10-CM | POA: Insufficient documentation

## 2020-11-27 DIAGNOSIS — E8770 Fluid overload, unspecified: Secondary | ICD-10-CM | POA: Diagnosis not present

## 2020-11-27 DIAGNOSIS — I70291 Other atherosclerosis of native arteries of extremities, right leg: Secondary | ICD-10-CM | POA: Diagnosis not present

## 2020-11-27 DIAGNOSIS — N179 Acute kidney failure, unspecified: Secondary | ICD-10-CM | POA: Diagnosis not present

## 2020-11-27 DIAGNOSIS — Z6838 Body mass index (BMI) 38.0-38.9, adult: Secondary | ICD-10-CM | POA: Diagnosis not present

## 2020-11-27 DIAGNOSIS — D696 Thrombocytopenia, unspecified: Secondary | ICD-10-CM | POA: Diagnosis present

## 2020-11-27 DIAGNOSIS — J189 Pneumonia, unspecified organism: Secondary | ICD-10-CM | POA: Diagnosis present

## 2020-11-27 DIAGNOSIS — A4189 Other specified sepsis: Secondary | ICD-10-CM | POA: Diagnosis present

## 2020-11-27 DIAGNOSIS — Z8614 Personal history of Methicillin resistant Staphylococcus aureus infection: Secondary | ICD-10-CM | POA: Diagnosis not present

## 2020-11-27 DIAGNOSIS — M79604 Pain in right leg: Secondary | ICD-10-CM | POA: Diagnosis not present

## 2020-11-27 DIAGNOSIS — I5033 Acute on chronic diastolic (congestive) heart failure: Secondary | ICD-10-CM | POA: Diagnosis present

## 2020-11-27 DIAGNOSIS — I452 Bifascicular block: Secondary | ICD-10-CM | POA: Diagnosis not present

## 2020-11-27 DIAGNOSIS — R2681 Unsteadiness on feet: Secondary | ICD-10-CM | POA: Diagnosis not present

## 2020-11-27 DIAGNOSIS — F05 Delirium due to known physiological condition: Secondary | ICD-10-CM | POA: Diagnosis not present

## 2020-11-27 DIAGNOSIS — I444 Left anterior fascicular block: Secondary | ICD-10-CM | POA: Diagnosis not present

## 2020-11-27 DIAGNOSIS — E785 Hyperlipidemia, unspecified: Secondary | ICD-10-CM | POA: Diagnosis not present

## 2020-11-27 DIAGNOSIS — M5136 Other intervertebral disc degeneration, lumbar region: Secondary | ICD-10-CM | POA: Diagnosis not present

## 2020-11-27 DIAGNOSIS — R5381 Other malaise: Secondary | ICD-10-CM | POA: Diagnosis not present

## 2020-11-27 DIAGNOSIS — R410 Disorientation, unspecified: Secondary | ICD-10-CM | POA: Diagnosis not present

## 2020-11-27 DIAGNOSIS — N183 Chronic kidney disease, stage 3 unspecified: Secondary | ICD-10-CM | POA: Diagnosis present

## 2020-11-27 DIAGNOSIS — R509 Fever, unspecified: Secondary | ICD-10-CM | POA: Diagnosis not present

## 2020-11-27 DIAGNOSIS — I959 Hypotension, unspecified: Secondary | ICD-10-CM | POA: Diagnosis not present

## 2020-11-27 DIAGNOSIS — N261 Atrophy of kidney (terminal): Secondary | ICD-10-CM | POA: Diagnosis not present

## 2020-11-27 DIAGNOSIS — Z794 Long term (current) use of insulin: Secondary | ICD-10-CM | POA: Diagnosis not present

## 2020-11-27 DIAGNOSIS — R748 Abnormal levels of other serum enzymes: Secondary | ICD-10-CM | POA: Diagnosis not present

## 2020-11-27 DIAGNOSIS — R238 Other skin changes: Secondary | ICD-10-CM | POA: Diagnosis not present

## 2020-11-27 DIAGNOSIS — Z743 Need for continuous supervision: Secondary | ICD-10-CM | POA: Diagnosis not present

## 2020-11-27 DIAGNOSIS — J9621 Acute and chronic respiratory failure with hypoxia: Secondary | ICD-10-CM | POA: Diagnosis not present

## 2020-11-27 DIAGNOSIS — Q433 Congenital malformations of intestinal fixation: Secondary | ICD-10-CM | POA: Diagnosis not present

## 2020-11-27 DIAGNOSIS — D509 Iron deficiency anemia, unspecified: Secondary | ICD-10-CM | POA: Diagnosis present

## 2020-11-27 DIAGNOSIS — G2581 Restless legs syndrome: Secondary | ICD-10-CM | POA: Diagnosis not present

## 2020-11-27 DIAGNOSIS — J9601 Acute respiratory failure with hypoxia: Secondary | ICD-10-CM | POA: Diagnosis not present

## 2020-11-27 DIAGNOSIS — R404 Transient alteration of awareness: Secondary | ICD-10-CM | POA: Diagnosis not present

## 2020-11-27 DIAGNOSIS — R531 Weakness: Secondary | ICD-10-CM | POA: Diagnosis not present

## 2020-11-27 DIAGNOSIS — Z87891 Personal history of nicotine dependence: Secondary | ICD-10-CM | POA: Diagnosis not present

## 2020-11-27 DIAGNOSIS — K219 Gastro-esophageal reflux disease without esophagitis: Secondary | ICD-10-CM | POA: Diagnosis not present

## 2020-11-27 DIAGNOSIS — J811 Chronic pulmonary edema: Secondary | ICD-10-CM | POA: Diagnosis not present

## 2020-11-27 DIAGNOSIS — D649 Anemia, unspecified: Secondary | ICD-10-CM | POA: Diagnosis not present

## 2020-11-27 DIAGNOSIS — I739 Peripheral vascular disease, unspecified: Secondary | ICD-10-CM | POA: Diagnosis not present

## 2020-11-27 DIAGNOSIS — E1169 Type 2 diabetes mellitus with other specified complication: Secondary | ICD-10-CM | POA: Diagnosis not present

## 2020-11-27 DIAGNOSIS — I83018 Varicose veins of right lower extremity with ulcer other part of lower leg: Secondary | ICD-10-CM | POA: Diagnosis not present

## 2020-11-27 DIAGNOSIS — F32A Depression, unspecified: Secondary | ICD-10-CM | POA: Diagnosis not present

## 2020-11-27 DIAGNOSIS — D62 Acute posthemorrhagic anemia: Secondary | ICD-10-CM | POA: Diagnosis present

## 2020-11-27 DIAGNOSIS — R1013 Epigastric pain: Secondary | ICD-10-CM | POA: Diagnosis not present

## 2020-11-27 DIAGNOSIS — J45909 Unspecified asthma, uncomplicated: Secondary | ICD-10-CM | POA: Diagnosis not present

## 2020-11-27 DIAGNOSIS — D61818 Other pancytopenia: Secondary | ICD-10-CM | POA: Diagnosis not present

## 2020-11-27 DIAGNOSIS — I130 Hypertensive heart and chronic kidney disease with heart failure and stage 1 through stage 4 chronic kidney disease, or unspecified chronic kidney disease: Secondary | ICD-10-CM | POA: Diagnosis present

## 2020-11-27 DIAGNOSIS — E1122 Type 2 diabetes mellitus with diabetic chronic kidney disease: Secondary | ICD-10-CM | POA: Diagnosis present

## 2020-11-27 DIAGNOSIS — E1165 Type 2 diabetes mellitus with hyperglycemia: Secondary | ICD-10-CM | POA: Diagnosis present

## 2020-11-27 DIAGNOSIS — M109 Gout, unspecified: Secondary | ICD-10-CM | POA: Diagnosis not present

## 2020-11-27 DIAGNOSIS — Z4682 Encounter for fitting and adjustment of non-vascular catheter: Secondary | ICD-10-CM | POA: Diagnosis not present

## 2020-11-27 DIAGNOSIS — R0602 Shortness of breath: Secondary | ICD-10-CM | POA: Diagnosis not present

## 2020-11-27 DIAGNOSIS — G8929 Other chronic pain: Secondary | ICD-10-CM | POA: Diagnosis not present

## 2020-11-27 DIAGNOSIS — I517 Cardiomegaly: Secondary | ICD-10-CM | POA: Diagnosis not present

## 2020-11-27 DIAGNOSIS — I4510 Unspecified right bundle-branch block: Secondary | ICD-10-CM | POA: Diagnosis not present

## 2020-11-27 DIAGNOSIS — G9389 Other specified disorders of brain: Secondary | ICD-10-CM | POA: Diagnosis not present

## 2020-11-27 DIAGNOSIS — E559 Vitamin D deficiency, unspecified: Secondary | ICD-10-CM | POA: Diagnosis not present

## 2020-11-27 DIAGNOSIS — G4733 Obstructive sleep apnea (adult) (pediatric): Secondary | ICD-10-CM | POA: Diagnosis not present

## 2020-11-27 DIAGNOSIS — J188 Other pneumonia, unspecified organism: Secondary | ICD-10-CM | POA: Diagnosis not present

## 2020-11-27 DIAGNOSIS — K449 Diaphragmatic hernia without obstruction or gangrene: Secondary | ICD-10-CM | POA: Diagnosis not present

## 2020-11-27 DIAGNOSIS — I493 Ventricular premature depolarization: Secondary | ICD-10-CM | POA: Diagnosis not present

## 2020-11-27 DIAGNOSIS — I5032 Chronic diastolic (congestive) heart failure: Secondary | ICD-10-CM | POA: Diagnosis not present

## 2020-11-27 DIAGNOSIS — R400 Somnolence: Secondary | ICD-10-CM | POA: Diagnosis not present

## 2020-11-27 DIAGNOSIS — R600 Localized edema: Secondary | ICD-10-CM | POA: Diagnosis not present

## 2020-11-27 DIAGNOSIS — Z7409 Other reduced mobility: Secondary | ICD-10-CM | POA: Diagnosis not present

## 2020-11-27 DIAGNOSIS — Z8679 Personal history of other diseases of the circulatory system: Secondary | ICD-10-CM | POA: Diagnosis not present

## 2020-11-27 DIAGNOSIS — I872 Venous insufficiency (chronic) (peripheral): Secondary | ICD-10-CM | POA: Diagnosis not present

## 2020-11-27 DIAGNOSIS — Z8673 Personal history of transient ischemic attack (TIA), and cerebral infarction without residual deficits: Secondary | ICD-10-CM | POA: Diagnosis not present

## 2020-11-27 DIAGNOSIS — L0390 Cellulitis, unspecified: Secondary | ICD-10-CM | POA: Diagnosis not present

## 2020-11-27 DIAGNOSIS — Z9119 Patient's noncompliance with other medical treatment and regimen: Secondary | ICD-10-CM | POA: Diagnosis not present

## 2020-11-27 DIAGNOSIS — L139 Bullous disorder, unspecified: Secondary | ICD-10-CM | POA: Diagnosis not present

## 2020-11-27 DIAGNOSIS — F419 Anxiety disorder, unspecified: Secondary | ICD-10-CM | POA: Diagnosis not present

## 2020-11-27 DIAGNOSIS — D469 Myelodysplastic syndrome, unspecified: Secondary | ICD-10-CM | POA: Diagnosis present

## 2020-11-27 DIAGNOSIS — J440 Chronic obstructive pulmonary disease with acute lower respiratory infection: Secondary | ICD-10-CM | POA: Diagnosis present

## 2020-11-27 DIAGNOSIS — G629 Polyneuropathy, unspecified: Secondary | ICD-10-CM | POA: Diagnosis not present

## 2020-11-27 DIAGNOSIS — I129 Hypertensive chronic kidney disease with stage 1 through stage 4 chronic kidney disease, or unspecified chronic kidney disease: Secondary | ICD-10-CM | POA: Diagnosis not present

## 2020-11-27 DIAGNOSIS — R339 Retention of urine, unspecified: Secondary | ICD-10-CM | POA: Diagnosis not present

## 2020-11-27 DIAGNOSIS — R6520 Severe sepsis without septic shock: Secondary | ICD-10-CM | POA: Diagnosis present

## 2020-11-27 MED ORDER — DOXYCYCLINE HYCLATE 100 MG PO TABS: 100.0000 mg | ORAL_TABLET | Freq: Two times a day (BID) | ORAL | 0 refills | Status: DC

## 2020-11-27 MED ORDER — SERTRALINE HCL 50 MG PO TABS: ORAL_TABLET | ORAL | 1 refills | Status: DC

## 2020-11-27 MED ORDER — CEFTRIAXONE SODIUM 1 G IJ SOLR
1.0000 g | Freq: Once | INTRAMUSCULAR | Status: AC
  Administered 2020-11-27: 1 g via INTRAMUSCULAR

## 2020-11-27 NOTE — Assessment & Plan Note (Signed)
Check labs  con't allopurinol ---- she had been off it for 3-4 days and prior to that was only taking 100 mg --- rehab increased it to 300 mg

## 2020-11-27 NOTE — Assessment & Plan Note (Signed)
abx per orders  Check ultrasound f/o dvt If redness spreads --- go to ER

## 2020-11-27 NOTE — Telephone Encounter (Signed)
Tom from home health called to let Erin [name] know that the patient is going to miss her occupational therapy appointment today because of her doctor's appointment.Marland Kitchen

## 2020-11-27 NOTE — Patient Instructions (Signed)

## 2020-11-27 NOTE — Progress Notes (Signed)
Subjective:   By signing my name below, I, Erin [name], attest that this documentation has been prepared under the direction and in the presence of Dr. Roma Schanz, DO. 11/27/2020    Patient ID: Erin [name], female    DOB: 23-Mar-1946, 75 y.o.   MRN: [id number]  Chief Complaint  Patient presents with   Dementia   Follow-up    HPI Patient is in today for a office visit. She is present with her daughter during this visit to help with transportation.  She complains of pain and swelling in both her legs and feet but the right leg and feet is worse than the left. Her right leg has red spots and it is spreading. She has a history of gout. She notes that she continued taking 100 mg allopurinol daily PO instead of 300 mg that was changed by her other provider for the past couple of months.    Past Medical History:  Diagnosis Date   Asthma    CHF (congestive heart failure) ([place])    hospital 11/2013-- [place]   Chronic kidney disease    Depression    Diabetes mellitus ([place])    GERD (gastroesophageal reflux disease)    Hyperlipidemia    Hypertension    Stroke [facility])     Past Surgical History:  Procedure Laterality Date   CHOLECYSTECTOMY  2002   LOOP RECORDER INSERTION N/A 08/01/2017   Procedure: LOOP RECORDER INSERTION;  Surgeon: Constance Haw, MD;  Location: [place] CV LAB;  Service: Cardiovascular;  Laterality: N/A;   SPINE SURGERY  aug 2015   [facility]   TEE WITHOUT CARDIOVERSION N/A 08/01/2017   Procedure: TRANSESOPHAGEAL ECHOCARDIOGRAM (TEE);  Surgeon: Sanda Klein, MD;  Location: [facility] ENDOSCOPY;  Service: Cardiovascular;  Laterality: N/A;   TONSILLECTOMY     TUBAL LIGATION      Family History  Problem Relation Age of Onset   Hypertension Mother    Alzheimer's disease Mother    Hyperlipidemia Mother    Heart disease Father        cad   Asthma Father    Cancer Father 81       leukemia   Stroke Father    Hypertension Father     Heart disease Sister 38       MI   Pulmonary fibrosis Sister    Arthritis Brother    Heart disease Maternal Uncle    Stroke Maternal Uncle    Uterine cancer Paternal Grandmother    Stomach cancer Paternal Grandfather    Heart disease Maternal Uncle    Heart disease Maternal Uncle    Cancer Maternal Aunt        leukemia    Social History   Socioeconomic History   Marital status: Widowed    Spouse name: Not on file   Number of children: 4   Years of education: 79   Highest education level: 12th grade  Occupational History   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, production: [employer]  Tobacco Use   Smoking status: Former    Packs/day: 1.50    Years: 20.00    Pack years: 30.00    Types: Cigarettes    Quit date: 08/02/1984    Years since quitting: 36.3   Smokeless tobacco: Never  Vaping Use   Vaping Use: Never used  Substance and Sexual Activity   Alcohol use: No   Drug use: No   Sexual activity: Not Currently  Partners: Male  Other Topics Concern   Not on file  Social History Narrative   NO REG EXERCISE   Caffeine use: daily   Social Determinants of Health   Financial Resource Strain: Low Risk    Difficulty of Paying Living Expenses: Not hard at all  Food Insecurity: No Food Insecurity   Worried About Charity fundraiser in the Last Year: Never true   [place] in the Last Year: Never true  Transportation Needs: No Transportation Needs   Lack of Transportation (Medical): No   Lack of Transportation (Non-Medical): No  Physical Activity: Inactive   Days of Exercise per Week: 0 days   Minutes of Exercise per Session: 0 min  Stress: No Stress Concern Present   Feeling of Stress : Only a little  Social Connections: Socially Isolated   Frequency of Communication with Friends and Family: More than three times a week   Frequency of Social Gatherings with Friends and Family: More than three times a week   Attends Religious Services: Never   Corporate treasurer or Organizations: No   Attends Archivist Meetings: Never   Marital Status: Widowed  Human resources officer Violence: Not At Risk   Fear of Current or Ex-Partner: No   Emotionally Abused: No   Physically Abused: No   Sexually Abused: No    Outpatient Medications Prior to Visit  Medication Sig Dispense Refill   albuterol (PROVENTIL) (2.5 MG/3ML) 0.083% nebulizer solution Take 3 mLs (2.5 mg total) by nebulization every 6 (six) hours as needed for wheezing or shortness of breath. 75 mL 12   albuterol (VENTOLIN HFA) 108 (90 Base) MCG/ACT inhaler Inhale 2 puffs into the lungs every 4 (four) hours as needed for wheezing or shortness of breath.      allopurinol (ZYLOPRIM) 300 MG tablet Take 1 tablet (300 mg total) by mouth daily. 90 tablet 3   AMBULATORY NON FORMULARY MEDICATION Rollator 1 Units 0   atorvastatin (LIPITOR) 40 MG tablet TAKE 1 TABLET (40 MG TOTAL) BY MOUTH DAILY. PT NEEDS OV FOR FURTHER REFILLS 90 tablet 1   BD PEN NEEDLE NANO 2ND GEN 32G X 4 MM MISC USE 4 TIMES A DAY 300 each 3   busPIRone (BUSPAR) 15 MG tablet Take 1 tablet (15 mg total) by mouth 3 (three) times daily. Pt needs OV for further refills 270 tablet 0   carvedilol (COREG) 6.25 MG tablet TAKE 1.5 TABLETS (9.375 MG TOTAL) BY MOUTH 2 (TWO) TIMES DAILY WITH A MEAL. 170 tablet 1   clopidogrel (PLAVIX) 75 MG tablet Take 1 tablet (75 mg total) by mouth daily. 30 tablet 3   diclofenac sodium (VOLTAREN) 1 % GEL Apply 4 g topically 4 (four) times daily. 100 g 2   EPINEPHrine 0.3 mg/0.3 mL IJ SOAJ injection PLEASE SEE ATTACHED FOR DETAILED DIRECTIONS     ergocalciferol (VITAMIN D2) 1.25 MG (50000 UT) capsule Take 1 capsule (50,000 Units total) by mouth once a week. Wednesday 12 capsule 1   fenofibrate micronized (LOFIBRA) 200 MG capsule Take 1 capsule (200 mg total) by mouth daily. Pt need OV for further refills 30 capsule 0   ferrous sulfate 325 (65 FE) MG tablet Take 325 mg by mouth.     Fluticasone-Salmeterol  (ADVAIR) 250-50 MCG/DOSE AEPB Inhale 1 puff into the lungs 2 (two) times daily.     gabapentin (NEURONTIN) 300 MG capsule Take 1 capsule (300 mg total) by mouth at bedtime. 90 capsule 3  glucose blood test strip Use 3x a day - One TOUch Ultra 300 each 3   HYDROcodone-acetaminophen (NORCO/VICODIN) 5-325 MG tablet Take 1 tablet by mouth every 4 (four) hours as needed for moderate pain.     Insulin Glargine (BASAGLAR KWIKPEN) 100 UNIT/ML INJECT 0.4 MLS (40 UNITS TOTAL) INTO THE SKIN AT BEDTIME. 45 pen 0   pantoprazole (PROTONIX) 40 MG tablet TAKE 1 TABLET BY MOUTH EVERY DAY 90 tablet 1   pramipexole (MIRAPEX) 1 MG tablet TAKE 1 TABLET BY MOUTH AT BEDTIME. 90 tablet 1   torsemide (DEMADEX) 20 MG tablet TAKE FOUR TABLETS BY MOUTH TWICE DAILY     sertraline (ZOLOFT) 50 MG tablet TAKE 1 AND 1/2 TABLETS DAILY BY MOUTH 135 tablet 1   Acetaminophen (TYLENOL 8 HOUR PO) Take 500 mg by mouth. (Patient not taking: No sig reported)     No facility-administered medications prior to visit.    Allergies  Allergen Reactions   Bee Venom     Review of Systems  Constitutional:  Negative for fever and malaise/fatigue.  HENT:  Negative for congestion.   Eyes:  Negative for blurred vision.  Respiratory:  Negative for shortness of breath.   Cardiovascular:  Positive for leg swelling (Bilateral lower legs and feet). Negative for chest pain and palpitations.  Gastrointestinal:  Negative for abdominal pain, blood in stool and nausea.  Genitourinary:  Negative for dysuria and frequency.  Musculoskeletal:  Positive for myalgias (Right foot and leg). Negative for falls.  Skin:  Negative for rash.       (+)Red spots on right leg that is spreading  Neurological:  Negative for dizziness, loss of consciousness and headaches.  Endo/Heme/Allergies:  Negative for environmental allergies.  Psychiatric/Behavioral:  Negative for depression. The patient is not nervous/anxious.       Objective:    Physical Exam Vitals  and nursing note reviewed.  Constitutional:      General: She is not in acute distress.    Appearance: Normal appearance. She is well-developed. She is not ill-appearing.  HENT:     Head: Normocephalic and atraumatic.     Right Ear: External ear normal.     Left Ear: External ear normal.  Eyes:     Extraocular Movements: Extraocular movements intact.     Conjunctiva/sclera: Conjunctivae normal.     Pupils: Pupils are equal, round, and reactive to light.  Neck:     Thyroid: No thyromegaly.     Vascular: No carotid bruit or JVD.  Cardiovascular:     Rate and Rhythm: Normal rate and regular rhythm.     Heart sounds: Normal heart sounds. No murmur heard.   No gallop.  Pulmonary:     Effort: Pulmonary effort is normal. No respiratory distress.     Breath sounds: Normal breath sounds. No wheezing or rales.  Chest:     Chest wall: No tenderness.  Musculoskeletal:     Cervical back: Normal range of motion and neck supple.     Right lower leg: Swelling and tenderness present.     Left lower leg: Swelling and tenderness present.     Comments: Erythema in right leg and foot.   Skin:    General: Skin is warm and dry.     Comments: Hot to touch on R low leg---- +redness up to knee No pain with palpation of calf    Neurological:     Mental Status: She is alert and oriented to person, place, and time.  Psychiatric:  Behavior: Behavior normal.        Judgment: Judgment normal.    BP (!) 160/120 (BP Location: Right Arm, Patient Position: Sitting, Cuff Size: Large)   Pulse 96   Temp 99.1 F (37.3 C) (Oral)   Resp 20   SpO2 95%  Wt Readings from Last 3 Encounters:  10/07/20 220 lb (99.8 kg)  08/26/20 215 lb 3.2 oz (97.6 kg)  08/15/20 224 lb (101.6 kg)    Diabetic Foot Exam - Simple   No data filed    Lab Results  Component Value Date   WBC 8.4 08/26/2020   HGB 11.4 (L) 08/26/2020   HCT 34.5 (L) 08/26/2020   PLT 92.0 (L) 08/26/2020   GLUCOSE 115 (H) 08/26/2020   CHOL  153 08/26/2020   TRIG 321.0 (H) 08/26/2020   HDL 38.50 (L) 08/26/2020   LDLDIRECT 68.0 08/26/2020   LDLCALC 51 07/24/2019   ALT 17 08/26/2020   AST 21 08/26/2020   NA 140 08/26/2020   K 4.6 08/26/2020   CL 105 08/26/2020   CREATININE 1.38 (H) 08/26/2020   BUN 51 (H) 08/26/2020   CO2 25 08/26/2020   TSH 2.29 05/17/2011   HGBA1C 6.3 08/26/2020   MICROALBUR 6.7 (H) 07/24/2019    Lab Results  Component Value Date   TSH 2.29 05/17/2011   Lab Results  Component Value Date   WBC 8.4 08/26/2020   HGB 11.4 (L) 08/26/2020   HCT 34.5 (L) 08/26/2020   MCV 92.8 08/26/2020   PLT 92.0 (L) 08/26/2020   Lab Results  Component Value Date   NA 140 08/26/2020   K 4.6 08/26/2020   CO2 25 08/26/2020   GLUCOSE 115 (H) 08/26/2020   BUN 51 (H) 08/26/2020   CREATININE 1.38 (H) 08/26/2020   BILITOT 0.7 08/26/2020   ALKPHOS 110 08/26/2020   AST 21 08/26/2020   ALT 17 08/26/2020   PROT 6.5 08/26/2020   ALBUMIN 4.2 08/26/2020   CALCIUM 9.1 08/26/2020   GFR 37.48 (L) 08/26/2020   Lab Results  Component Value Date   CHOL 153 08/26/2020   Lab Results  Component Value Date   HDL 38.50 (L) 08/26/2020   Lab Results  Component Value Date   LDLCALC 51 07/24/2019   Lab Results  Component Value Date   TRIG 321.0 (H) 08/26/2020   Lab Results  Component Value Date   CHOLHDL 4 08/26/2020   Lab Results  Component Value Date   HGBA1C 6.3 08/26/2020       Assessment & Plan:   Problem List Items Addressed This Visit       Unprioritized   Cellulitis of right leg    abx per orders  Check ultrasound f/o dvt If redness spreads --- go to ER       Relevant Medications   doxycycline (VIBRA-TABS) 100 MG tablet   Other Relevant Orders   CBC with Differential/Platelet   US Venous Img Lower Unilateral Right (DVT) (Completed)   History of gout - Primary    Check labs  con't allopurinol ---- she had been off it for 3-4 days and prior to that was only taking 100 mg --- rehab increased  it to 300 mg       Relevant Orders   Comprehensive metabolic panel   Uric acid   Other Visit Diagnoses     Depression, unspecified depression type       Relevant Medications   sertraline (ZOLOFT) 50 MG tablet  Meds ordered this encounter  Medications   sertraline (ZOLOFT) 50 MG tablet    Sig: TAKE 1 AND 1/2 TABLETS DAILY BY MOUTH    Dispense:  135 tablet    Refill:  1   doxycycline (VIBRA-TABS) 100 MG tablet    Sig: Take 1 tablet (100 mg total) by mouth 2 (two) times daily.    Dispense:  20 tablet    Refill:  0   cefTRIAXone (ROCEPHIN) injection 1 g    I, Dr. Roma Schanz, DO, personally preformed the services described in this documentation.  All medical record entries made by the scribe were at my direction and in my presence.  I have reviewed the chart and discharge instructions (if applicable) and agree that the record reflects my personal performance and is accurate and complete. 11/27/2020   I,Erin [name],acting as a scribe for Ann Held, DO.,have documented all relevant documentation on the behalf of Ann Held, DO,as directed by  Ann Held, DO while in the presence of Ann Held, DO.   Ann Held, DO

## 2020-11-28 ENCOUNTER — Telehealth: Payer: Self-pay | Admitting: Family Medicine

## 2020-11-28 LAB — CBC WITH DIFFERENTIAL/PLATELET
Basophils Absolute: 0.1 10*3/uL (ref 0.0–0.1)
Basophils Relative: 0.7 % (ref 0.0–3.0)
Eosinophils Absolute: 0 10*3/uL (ref 0.0–0.7)
Eosinophils Relative: 0.3 % (ref 0.0–5.0)
HCT: 28.5 % — ABNORMAL LOW (ref 36.0–46.0)
Hemoglobin: 9.5 g/dL — ABNORMAL LOW (ref 12.0–15.0)
Lymphocytes Relative: 3.9 % — ABNORMAL LOW (ref 12.0–46.0)
Lymphs Abs: 0.4 10*3/uL — ABNORMAL LOW (ref 0.7–4.0)
MCHC: 33.5 g/dL (ref 30.0–36.0)
MCV: 95.1 fl (ref 78.0–100.0)
Monocytes Absolute: 0.5 10*3/uL (ref 0.1–1.0)
Monocytes Relative: 5.5 % (ref 3.0–12.0)
Neutro Abs: 8.6 10*3/uL — ABNORMAL HIGH (ref 1.4–7.7)
Neutrophils Relative %: 89.6 % — ABNORMAL HIGH (ref 43.0–77.0)
Platelets: 75 10*3/uL — ABNORMAL LOW (ref 150.0–400.0)
RBC: 2.99 Mil/uL — ABNORMAL LOW (ref 3.87–5.11)
RDW: 17.4 % — ABNORMAL HIGH (ref 11.5–15.5)
WBC: 9.6 10*3/uL (ref 4.0–10.5)

## 2020-11-28 LAB — COMPREHENSIVE METABOLIC PANEL
ALT: 84 U/L — ABNORMAL HIGH (ref 0–35)
AST: 74 U/L — ABNORMAL HIGH (ref 0–37)
Albumin: 4 g/dL (ref 3.5–5.2)
Alkaline Phosphatase: 127 U/L — ABNORMAL HIGH (ref 39–117)
BUN: 76 mg/dL — ABNORMAL HIGH (ref 6–23)
CO2: 22 mEq/L (ref 19–32)
Calcium: 8.9 mg/dL (ref 8.4–10.5)
Chloride: 99 mEq/L (ref 96–112)
Creatinine, Ser: 1.7 mg/dL — ABNORMAL HIGH (ref 0.40–1.20)
GFR: 29.13 mL/min — ABNORMAL LOW (ref >60.00)
Glucose, Bld: 91 mg/dL (ref 70–99)
Potassium: 3.9 mEq/L (ref 3.5–5.1)
Sodium: 135 mEq/L (ref 135–145)
Total Bilirubin: 1.2 mg/dL (ref 0.2–1.2)
Total Protein: 6.5 g/dL (ref 6.0–8.3)

## 2020-11-28 LAB — URIC ACID: Uric Acid, Serum: 5.7 mg/dL (ref 2.4–7.0)

## 2020-11-28 NOTE — Telephone Encounter (Signed)
Erin [name] with [facility] wanting to inform Dr. Etter Sjogren pt was admitted to [facility] last night for cellulitis

## 2020-12-01 NOTE — Progress Notes (Signed)
Carelink Summary Report / Loop Recorder 

## 2020-12-04 ENCOUNTER — Ambulatory Visit: Payer: Medicare Other | Admitting: Family Medicine

## 2020-12-04 DIAGNOSIS — D61818 Other pancytopenia: Secondary | ICD-10-CM | POA: Diagnosis not present

## 2020-12-04 DIAGNOSIS — R1013 Epigastric pain: Secondary | ICD-10-CM | POA: Diagnosis not present

## 2020-12-04 DIAGNOSIS — R072 Precordial pain: Secondary | ICD-10-CM | POA: Diagnosis not present

## 2020-12-09 ENCOUNTER — Ambulatory Visit (INDEPENDENT_AMBULATORY_CARE_PROVIDER_SITE_OTHER): Payer: Medicare Other

## 2020-12-09 DIAGNOSIS — I83018 Varicose veins of right lower extremity with ulcer other part of lower leg: Secondary | ICD-10-CM | POA: Diagnosis not present

## 2020-12-09 DIAGNOSIS — F411 Generalized anxiety disorder: Secondary | ICD-10-CM | POA: Diagnosis not present

## 2020-12-09 DIAGNOSIS — E1122 Type 2 diabetes mellitus with diabetic chronic kidney disease: Secondary | ICD-10-CM | POA: Diagnosis not present

## 2020-12-09 DIAGNOSIS — E8770 Fluid overload, unspecified: Secondary | ICD-10-CM | POA: Diagnosis not present

## 2020-12-09 DIAGNOSIS — R600 Localized edema: Secondary | ICD-10-CM | POA: Diagnosis not present

## 2020-12-09 DIAGNOSIS — R410 Disorientation, unspecified: Secondary | ICD-10-CM | POA: Diagnosis not present

## 2020-12-09 DIAGNOSIS — M545 Low back pain, unspecified: Secondary | ICD-10-CM | POA: Diagnosis not present

## 2020-12-09 DIAGNOSIS — Z7401 Bed confinement status: Secondary | ICD-10-CM | POA: Diagnosis not present

## 2020-12-09 DIAGNOSIS — F419 Anxiety disorder, unspecified: Secondary | ICD-10-CM | POA: Diagnosis not present

## 2020-12-09 DIAGNOSIS — K449 Diaphragmatic hernia without obstruction or gangrene: Secondary | ICD-10-CM | POA: Diagnosis not present

## 2020-12-09 DIAGNOSIS — N183 Chronic kidney disease, stage 3 unspecified: Secondary | ICD-10-CM | POA: Diagnosis not present

## 2020-12-09 DIAGNOSIS — G8929 Other chronic pain: Secondary | ICD-10-CM | POA: Diagnosis not present

## 2020-12-09 DIAGNOSIS — R0689 Other abnormalities of breathing: Secondary | ICD-10-CM | POA: Diagnosis not present

## 2020-12-09 DIAGNOSIS — E1169 Type 2 diabetes mellitus with other specified complication: Secondary | ICD-10-CM | POA: Diagnosis not present

## 2020-12-09 DIAGNOSIS — I4510 Unspecified right bundle-branch block: Secondary | ICD-10-CM | POA: Diagnosis not present

## 2020-12-09 DIAGNOSIS — I452 Bifascicular block: Secondary | ICD-10-CM | POA: Diagnosis not present

## 2020-12-09 DIAGNOSIS — N261 Atrophy of kidney (terminal): Secondary | ICD-10-CM | POA: Diagnosis not present

## 2020-12-09 DIAGNOSIS — F32A Depression, unspecified: Secondary | ICD-10-CM | POA: Diagnosis not present

## 2020-12-09 DIAGNOSIS — J9621 Acute and chronic respiratory failure with hypoxia: Secondary | ICD-10-CM | POA: Diagnosis not present

## 2020-12-09 DIAGNOSIS — E8779 Other fluid overload: Secondary | ICD-10-CM | POA: Diagnosis not present

## 2020-12-09 DIAGNOSIS — I5022 Chronic systolic (congestive) heart failure: Secondary | ICD-10-CM | POA: Diagnosis not present

## 2020-12-09 DIAGNOSIS — Z743 Need for continuous supervision: Secondary | ICD-10-CM | POA: Diagnosis not present

## 2020-12-09 DIAGNOSIS — R1310 Dysphagia, unspecified: Secondary | ICD-10-CM | POA: Diagnosis not present

## 2020-12-09 DIAGNOSIS — M5136 Other intervertebral disc degeneration, lumbar region: Secondary | ICD-10-CM | POA: Diagnosis not present

## 2020-12-09 DIAGNOSIS — I639 Cerebral infarction, unspecified: Secondary | ICD-10-CM | POA: Diagnosis not present

## 2020-12-09 DIAGNOSIS — J9600 Acute respiratory failure, unspecified whether with hypoxia or hypercapnia: Secondary | ICD-10-CM | POA: Diagnosis not present

## 2020-12-09 DIAGNOSIS — I10 Essential (primary) hypertension: Secondary | ICD-10-CM | POA: Diagnosis not present

## 2020-12-09 DIAGNOSIS — G4733 Obstructive sleep apnea (adult) (pediatric): Secondary | ICD-10-CM | POA: Diagnosis not present

## 2020-12-09 DIAGNOSIS — I517 Cardiomegaly: Secondary | ICD-10-CM | POA: Diagnosis not present

## 2020-12-09 DIAGNOSIS — R1084 Generalized abdominal pain: Secondary | ICD-10-CM | POA: Diagnosis not present

## 2020-12-09 DIAGNOSIS — R110 Nausea: Secondary | ICD-10-CM | POA: Diagnosis not present

## 2020-12-09 DIAGNOSIS — R1013 Epigastric pain: Secondary | ICD-10-CM | POA: Diagnosis not present

## 2020-12-09 DIAGNOSIS — R2681 Unsteadiness on feet: Secondary | ICD-10-CM | POA: Diagnosis not present

## 2020-12-09 DIAGNOSIS — I959 Hypotension, unspecified: Secondary | ICD-10-CM | POA: Diagnosis not present

## 2020-12-09 DIAGNOSIS — I872 Venous insufficiency (chronic) (peripheral): Secondary | ICD-10-CM | POA: Diagnosis not present

## 2020-12-09 DIAGNOSIS — F0390 Unspecified dementia without behavioral disturbance: Secondary | ICD-10-CM | POA: Diagnosis not present

## 2020-12-09 DIAGNOSIS — N179 Acute kidney failure, unspecified: Secondary | ICD-10-CM | POA: Diagnosis not present

## 2020-12-09 DIAGNOSIS — I444 Left anterior fascicular block: Secondary | ICD-10-CM | POA: Diagnosis not present

## 2020-12-09 DIAGNOSIS — J189 Pneumonia, unspecified organism: Secondary | ICD-10-CM | POA: Diagnosis not present

## 2020-12-09 DIAGNOSIS — Z20822 Contact with and (suspected) exposure to covid-19: Secondary | ICD-10-CM | POA: Diagnosis not present

## 2020-12-09 DIAGNOSIS — F0391 Unspecified dementia with behavioral disturbance: Secondary | ICD-10-CM | POA: Diagnosis not present

## 2020-12-09 DIAGNOSIS — I700 Atherosclerosis of aorta: Secondary | ICD-10-CM | POA: Diagnosis not present

## 2020-12-09 DIAGNOSIS — E785 Hyperlipidemia, unspecified: Secondary | ICD-10-CM | POA: Diagnosis not present

## 2020-12-09 DIAGNOSIS — L03115 Cellulitis of right lower limb: Secondary | ICD-10-CM | POA: Diagnosis not present

## 2020-12-09 DIAGNOSIS — E559 Vitamin D deficiency, unspecified: Secondary | ICD-10-CM | POA: Diagnosis not present

## 2020-12-09 DIAGNOSIS — E1141 Type 2 diabetes mellitus with diabetic mononeuropathy: Secondary | ICD-10-CM | POA: Diagnosis not present

## 2020-12-09 DIAGNOSIS — D61818 Other pancytopenia: Secondary | ICD-10-CM | POA: Diagnosis not present

## 2020-12-09 DIAGNOSIS — D631 Anemia in chronic kidney disease: Secondary | ICD-10-CM | POA: Diagnosis not present

## 2020-12-09 DIAGNOSIS — I509 Heart failure, unspecified: Secondary | ICD-10-CM | POA: Diagnosis not present

## 2020-12-09 DIAGNOSIS — D696 Thrombocytopenia, unspecified: Secondary | ICD-10-CM | POA: Diagnosis not present

## 2020-12-09 DIAGNOSIS — I129 Hypertensive chronic kidney disease with stage 1 through stage 4 chronic kidney disease, or unspecified chronic kidney disease: Secondary | ICD-10-CM | POA: Diagnosis not present

## 2020-12-09 DIAGNOSIS — N1832 Chronic kidney disease, stage 3b: Secondary | ICD-10-CM | POA: Diagnosis not present

## 2020-12-09 DIAGNOSIS — D649 Anemia, unspecified: Secondary | ICD-10-CM | POA: Diagnosis not present

## 2020-12-09 DIAGNOSIS — Z0389 Encounter for observation for other suspected diseases and conditions ruled out: Secondary | ICD-10-CM | POA: Diagnosis not present

## 2020-12-09 DIAGNOSIS — I493 Ventricular premature depolarization: Secondary | ICD-10-CM | POA: Diagnosis not present

## 2020-12-09 DIAGNOSIS — Z7409 Other reduced mobility: Secondary | ICD-10-CM | POA: Diagnosis not present

## 2020-12-09 DIAGNOSIS — R451 Restlessness and agitation: Secondary | ICD-10-CM | POA: Diagnosis not present

## 2020-12-09 DIAGNOSIS — J90 Pleural effusion, not elsewhere classified: Secondary | ICD-10-CM | POA: Diagnosis not present

## 2020-12-09 DIAGNOSIS — J45909 Unspecified asthma, uncomplicated: Secondary | ICD-10-CM | POA: Diagnosis not present

## 2020-12-09 DIAGNOSIS — R059 Cough, unspecified: Secondary | ICD-10-CM | POA: Diagnosis not present

## 2020-12-09 DIAGNOSIS — M898X9 Other specified disorders of bone, unspecified site: Secondary | ICD-10-CM | POA: Diagnosis not present

## 2020-12-09 DIAGNOSIS — Z8673 Personal history of transient ischemic attack (TIA), and cerebral infarction without residual deficits: Secondary | ICD-10-CM | POA: Diagnosis not present

## 2020-12-09 DIAGNOSIS — R0902 Hypoxemia: Secondary | ICD-10-CM | POA: Diagnosis not present

## 2020-12-09 DIAGNOSIS — F330 Major depressive disorder, recurrent, mild: Secondary | ICD-10-CM | POA: Diagnosis not present

## 2020-12-09 DIAGNOSIS — G629 Polyneuropathy, unspecified: Secondary | ICD-10-CM | POA: Diagnosis not present

## 2020-12-09 DIAGNOSIS — K219 Gastro-esophageal reflux disease without esophagitis: Secondary | ICD-10-CM | POA: Diagnosis not present

## 2020-12-09 DIAGNOSIS — R109 Unspecified abdominal pain: Secondary | ICD-10-CM | POA: Diagnosis not present

## 2020-12-09 DIAGNOSIS — M109 Gout, unspecified: Secondary | ICD-10-CM | POA: Diagnosis not present

## 2020-12-09 DIAGNOSIS — E1151 Type 2 diabetes mellitus with diabetic peripheral angiopathy without gangrene: Secondary | ICD-10-CM | POA: Diagnosis not present

## 2020-12-09 DIAGNOSIS — I739 Peripheral vascular disease, unspecified: Secondary | ICD-10-CM | POA: Diagnosis not present

## 2020-12-09 DIAGNOSIS — G2581 Restless legs syndrome: Secondary | ICD-10-CM | POA: Diagnosis not present

## 2020-12-09 DIAGNOSIS — I5032 Chronic diastolic (congestive) heart failure: Secondary | ICD-10-CM | POA: Diagnosis not present

## 2020-12-09 DIAGNOSIS — M170 Bilateral primary osteoarthritis of knee: Secondary | ICD-10-CM | POA: Diagnosis not present

## 2020-12-09 DIAGNOSIS — R0602 Shortness of breath: Secondary | ICD-10-CM | POA: Diagnosis not present

## 2020-12-09 LAB — CUP PACEART REMOTE DEVICE CHECK
Date Time Interrogation Session: 20220904012130
Implantable Pulse Generator Implant Date: 20190429

## 2020-12-10 DIAGNOSIS — L03115 Cellulitis of right lower limb: Secondary | ICD-10-CM | POA: Diagnosis not present

## 2020-12-10 DIAGNOSIS — D61818 Other pancytopenia: Secondary | ICD-10-CM | POA: Diagnosis not present

## 2020-12-10 DIAGNOSIS — E1151 Type 2 diabetes mellitus with diabetic peripheral angiopathy without gangrene: Secondary | ICD-10-CM | POA: Diagnosis not present

## 2020-12-10 DIAGNOSIS — G4733 Obstructive sleep apnea (adult) (pediatric): Secondary | ICD-10-CM | POA: Diagnosis not present

## 2020-12-10 DIAGNOSIS — M109 Gout, unspecified: Secondary | ICD-10-CM | POA: Diagnosis not present

## 2020-12-10 DIAGNOSIS — R410 Disorientation, unspecified: Secondary | ICD-10-CM | POA: Diagnosis not present

## 2020-12-10 DIAGNOSIS — J9600 Acute respiratory failure, unspecified whether with hypoxia or hypercapnia: Secondary | ICD-10-CM | POA: Diagnosis not present

## 2020-12-14 DIAGNOSIS — K449 Diaphragmatic hernia without obstruction or gangrene: Secondary | ICD-10-CM | POA: Diagnosis not present

## 2020-12-14 DIAGNOSIS — R110 Nausea: Secondary | ICD-10-CM | POA: Diagnosis not present

## 2020-12-14 DIAGNOSIS — I700 Atherosclerosis of aorta: Secondary | ICD-10-CM | POA: Diagnosis not present

## 2020-12-14 DIAGNOSIS — N261 Atrophy of kidney (terminal): Secondary | ICD-10-CM | POA: Diagnosis not present

## 2020-12-14 DIAGNOSIS — J189 Pneumonia, unspecified organism: Secondary | ICD-10-CM | POA: Diagnosis not present

## 2020-12-14 DIAGNOSIS — I452 Bifascicular block: Secondary | ICD-10-CM | POA: Diagnosis not present

## 2020-12-14 DIAGNOSIS — Z20822 Contact with and (suspected) exposure to covid-19: Secondary | ICD-10-CM | POA: Diagnosis not present

## 2020-12-14 DIAGNOSIS — J90 Pleural effusion, not elsewhere classified: Secondary | ICD-10-CM | POA: Diagnosis not present

## 2020-12-14 DIAGNOSIS — I517 Cardiomegaly: Secondary | ICD-10-CM | POA: Diagnosis not present

## 2020-12-14 DIAGNOSIS — R1013 Epigastric pain: Secondary | ICD-10-CM | POA: Diagnosis not present

## 2020-12-14 DIAGNOSIS — R109 Unspecified abdominal pain: Secondary | ICD-10-CM | POA: Diagnosis not present

## 2020-12-15 NOTE — Progress Notes (Deleted)
[place] Phone: [phone] Subjective:    I'm seeing this patient by the request  of:  Ann Held, DO  CC:   [id number]  10/07/2020 Patient has had back pain with radiation.  Patient encouraged to try another epidural.  Has responded to it previously.  Patient is on a blood thinner and will work this out accordingly.  Patient will follow up with me again in 8 to 10 weeks otherwise.  Patient as well as primary caregiver is in approval with this type of treatment.  Repeat injection given again today.  Tolerated the procedure well.  We will see if we can get approval for viscosupplementation which patient has had some improvement previously.  Discussed posture and ergonomics.  Discussed with patient's caregiver that this is a long process.  I do feel that some of the lumbar spine could be contributing overall and we will try another epidural as well.  Follow-up with me again in 8 weeks.  Update 12/16/2020 Erin [name] is a 75 y.o. female coming in with complaint of back and B knee pain. Epidural 10/28/2020. Patient states        Past Medical History:  Diagnosis Date   Asthma    CHF (congestive heart failure) ([place])    hospital 11/2013-- [place]   Chronic kidney disease    Depression    Diabetes mellitus ([place])    GERD (gastroesophageal reflux disease)    Hyperlipidemia    Hypertension    Stroke [facility])    Past Surgical History:  Procedure Laterality Date   CHOLECYSTECTOMY  2002   LOOP RECORDER INSERTION N/A 08/01/2017   Procedure: LOOP RECORDER INSERTION;  Surgeon: Constance Haw, MD;  Location: [place] CV LAB;  Service: Cardiovascular;  Laterality: N/A;   SPINE SURGERY  aug 2015   [facility]   TEE WITHOUT CARDIOVERSION N/A 08/01/2017   Procedure: TRANSESOPHAGEAL ECHOCARDIOGRAM (TEE);  Surgeon: Sanda Klein, MD;  Location: [facility] ENDOSCOPY;  Service: Cardiovascular;   Laterality: N/A;   TONSILLECTOMY     TUBAL LIGATION     Social History   Socioeconomic History   Marital status: Widowed    Spouse name: Not on file   Number of children: 4   Years of education: 62   Highest education level: 12th grade  Occupational History   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, production: [employer]  Tobacco Use   Smoking status: Former    Packs/day: 1.50    Years: 20.00    Pack years: 30.00    Types: Cigarettes    Quit date: 08/02/1984    Years since quitting: 36.3   Smokeless tobacco: Never  Vaping Use   Vaping Use: Never used  Substance and Sexual Activity   Alcohol use: No   Drug use: No   Sexual activity: Not Currently    Partners: Male  Other Topics Concern   Not on file  Social History Narrative   NO REG EXERCISE   Caffeine use: daily   Social Determinants of Health   Financial Resource Strain: Low Risk    Difficulty of Paying Living Expenses: Not hard at all  Food Insecurity: No Food Insecurity   Worried About Charity fundraiser in the Last Year: Never true   [place] in the Last Year: Never true  Transportation Needs: No Transportation Needs   Lack of Transportation (Medical): No   Lack  of Transportation (Non-Medical): No  Physical Activity: Inactive   Days of Exercise per Week: 0 days   Minutes of Exercise per Session: 0 min  Stress: No Stress Concern Present   Feeling of Stress : Only a little  Social Connections: Socially Isolated   Frequency of Communication with Friends and Family: More than three times a week   Frequency of Social Gatherings with Friends and Family: More than three times a week   Attends Religious Services: Never   Marine scientist or Organizations: No   Attends Archivist Meetings: Never   Marital Status: Widowed   Allergies  Allergen Reactions   Bee Venom    Family History  Problem Relation Age of Onset   Hypertension Mother    Alzheimer's disease Mother    Hyperlipidemia  Mother    Heart disease Father        cad   Asthma Father    Cancer Father 77       leukemia   Stroke Father    Hypertension Father    Heart disease Sister 46       MI   Pulmonary fibrosis Sister    Arthritis Brother    Heart disease Maternal Uncle    Stroke Maternal Uncle    Uterine cancer Paternal Grandmother    Stomach cancer Paternal Grandfather    Heart disease Maternal Uncle    Heart disease Maternal Uncle    Cancer Maternal Aunt        leukemia    Current Outpatient Medications (Endocrine & Metabolic):    Insulin Glargine (BASAGLAR KWIKPEN) 100 UNIT/ML, INJECT 0.4 MLS (40 UNITS TOTAL) INTO THE SKIN AT BEDTIME.  Current Outpatient Medications (Cardiovascular):    atorvastatin (LIPITOR) 40 MG tablet, TAKE 1 TABLET (40 MG TOTAL) BY MOUTH DAILY. PT NEEDS OV FOR FURTHER REFILLS   carvedilol (COREG) 6.25 MG tablet, TAKE 1.5 TABLETS (9.375 MG TOTAL) BY MOUTH 2 (TWO) TIMES DAILY WITH A MEAL.   EPINEPHrine 0.3 mg/0.3 mL IJ SOAJ injection, PLEASE SEE ATTACHED FOR DETAILED DIRECTIONS   fenofibrate micronized (LOFIBRA) 200 MG capsule, Take 1 capsule (200 mg total) by mouth daily. Pt need OV for further refills   torsemide (DEMADEX) 20 MG tablet, TAKE FOUR TABLETS BY MOUTH TWICE DAILY  Current Outpatient Medications (Respiratory):    albuterol (PROVENTIL) (2.5 MG/3ML) 0.083% nebulizer solution, Take 3 mLs (2.5 mg total) by nebulization every 6 (six) hours as needed for wheezing or shortness of breath.   albuterol (VENTOLIN HFA) 108 (90 Base) MCG/ACT inhaler, Inhale 2 puffs into the lungs every 4 (four) hours as needed for wheezing or shortness of breath.    Fluticasone-Salmeterol (ADVAIR) 250-50 MCG/DOSE AEPB, Inhale 1 puff into the lungs 2 (two) times daily.  Current Outpatient Medications (Analgesics):    Acetaminophen (TYLENOL 8 HOUR PO), Take 500 mg by mouth. (Patient not taking: No sig reported)   allopurinol (ZYLOPRIM) 300 MG tablet, Take 1 tablet (300 mg total) by mouth  daily.   HYDROcodone-acetaminophen (NORCO/VICODIN) 5-325 MG tablet, Take 1 tablet by mouth every 4 (four) hours as needed for moderate pain.  Current Outpatient Medications (Hematological):    clopidogrel (PLAVIX) 75 MG tablet, Take 1 tablet (75 mg total) by mouth daily.   ferrous sulfate 325 (65 FE) MG tablet, Take 325 mg by mouth.  Current Outpatient Medications (Other):    AMBULATORY NON FORMULARY MEDICATION, Rollator   BD PEN NEEDLE NANO 2ND GEN 32G X 4 MM MISC, USE 4 TIMES  A DAY   busPIRone (BUSPAR) 15 MG tablet, Take 1 tablet (15 mg total) by mouth 3 (three) times daily. Pt needs OV for further refills   diclofenac sodium (VOLTAREN) 1 % GEL, Apply 4 g topically 4 (four) times daily.   doxycycline (VIBRA-TABS) 100 MG tablet, Take 1 tablet (100 mg total) by mouth 2 (two) times daily.   ergocalciferol (VITAMIN D2) 1.25 MG (50000 UT) capsule, Take 1 capsule (50,000 Units total) by mouth once a week. Wednesday   gabapentin (NEURONTIN) 300 MG capsule, Take 1 capsule (300 mg total) by mouth at bedtime.   glucose blood test strip, Use 3x a day - One TOUch Ultra   pantoprazole (PROTONIX) 40 MG tablet, TAKE 1 TABLET BY MOUTH EVERY DAY   pramipexole (MIRAPEX) 1 MG tablet, TAKE 1 TABLET BY MOUTH AT BEDTIME.   sertraline (ZOLOFT) 50 MG tablet, TAKE 1 AND 1/2 TABLETS DAILY BY MOUTH   Reviewed prior external information including notes and imaging from  primary care provider As well as notes that were available from care everywhere and other healthcare systems.  Past medical history, social, surgical and family history all reviewed in electronic medical record.  No pertanent information unless stated regarding to the chief complaint.   Review of Systems:  No headache, visual changes, nausea, vomiting, diarrhea, constipation, dizziness, abdominal pain, skin rash, fevers, chills, night sweats, weight loss, swollen lymph nodes, body aches, joint swelling, chest pain, shortness of breath, mood  changes. POSITIVE muscle aches  Objective  There were no vitals taken for this visit.   General: No apparent distress alert and oriented x3 mood and affect normal, dressed appropriately.  HEENT: Pupils equal, extraocular movements intact  Respiratory: Patient's speak in full sentences and does not appear short of breath  Cardiovascular: No lower extremity edema, non tender, no erythema  Gait normal with good balance and coordination.  MSK:  Non tender with full range of motion and good stability and symmetric strength and tone of shoulders, elbows, wrist, hip, knee and ankles bilaterally.     Impression and Recommendations:     The above documentation has been reviewed and is accurate and complete Jacqualin Combes

## 2020-12-16 ENCOUNTER — Ambulatory Visit: Payer: Medicare Other | Admitting: Family Medicine

## 2020-12-17 DIAGNOSIS — L03115 Cellulitis of right lower limb: Secondary | ICD-10-CM | POA: Diagnosis not present

## 2020-12-17 NOTE — Progress Notes (Signed)
Carelink Summary Report / Loop Recorder 

## 2020-12-21 ENCOUNTER — Other Ambulatory Visit: Payer: Self-pay | Admitting: Adult Health

## 2020-12-21 DIAGNOSIS — L03115 Cellulitis of right lower limb: Secondary | ICD-10-CM | POA: Diagnosis not present

## 2020-12-21 DIAGNOSIS — I10 Essential (primary) hypertension: Secondary | ICD-10-CM

## 2020-12-21 DIAGNOSIS — E1141 Type 2 diabetes mellitus with diabetic mononeuropathy: Secondary | ICD-10-CM | POA: Diagnosis not present

## 2020-12-21 DIAGNOSIS — R1013 Epigastric pain: Secondary | ICD-10-CM | POA: Diagnosis not present

## 2020-12-22 ENCOUNTER — Other Ambulatory Visit (HOSPITAL_COMMUNITY): Payer: Self-pay

## 2020-12-22 DIAGNOSIS — R059 Cough, unspecified: Secondary | ICD-10-CM

## 2020-12-22 DIAGNOSIS — R1310 Dysphagia, unspecified: Secondary | ICD-10-CM

## 2020-12-23 ENCOUNTER — Ambulatory Visit (HOSPITAL_COMMUNITY)
Admission: RE | Admit: 2020-12-23 | Discharge: 2020-12-23 | Disposition: A | Discharge status: Home / Self Care | Payer: No Typology Code available for payment source | Source: Ambulatory Visit | Attending: Family Medicine | Admitting: Family Medicine

## 2020-12-23 ENCOUNTER — Other Ambulatory Visit: Payer: Self-pay

## 2020-12-23 ENCOUNTER — Ambulatory Visit (HOSPITAL_COMMUNITY)
Admission: RE | Admit: 2020-12-23 | Discharge: 2020-12-23 | Disposition: A | Discharge status: Home / Self Care | Payer: No Typology Code available for payment source | Source: Ambulatory Visit | Attending: Internal Medicine | Admitting: Internal Medicine

## 2020-12-23 DIAGNOSIS — K219 Gastro-esophageal reflux disease without esophagitis: Secondary | ICD-10-CM | POA: Diagnosis not present

## 2020-12-23 DIAGNOSIS — R059 Cough, unspecified: Secondary | ICD-10-CM | POA: Diagnosis not present

## 2020-12-23 DIAGNOSIS — R1310 Dysphagia, unspecified: Secondary | ICD-10-CM | POA: Diagnosis not present

## 2020-12-23 DIAGNOSIS — R1312 Dysphagia, oropharyngeal phase: Secondary | ICD-10-CM

## 2020-12-23 NOTE — Progress Notes (Signed)
[facility] [place] [address] [place] Phone: [phone] Subjective:   IVilma Meckel, am serving as a scribe for Dr. Hulan Saas. This visit occurred during the SARS-CoV-2 public health emergency.  Safety protocols were in place, including screening questions prior to the visit, additional usage of staff PPE, and extensive cleaning of exam room while observing appropriate contact time as indicated for disinfecting solutions.   I'm seeing this patient by the request  of:  Ann Held, DO  CC: Bilateral knee pain  FYB:[id number]  10/07/2020 Patient has had back pain with radiation.  Patient encouraged to try another epidural.  Has responded to it previously.  Patient is on a blood thinner and will work this out accordingly.  Patient will follow up with me again in 8 to 10 weeks otherwise.  Patient as well as primary caregiver is in approval with this type of treatment.  Repeat injection given again today.  Tolerated the procedure well.  We will see if we can get approval for viscosupplementation which patient has had some improvement previously.  Discussed posture and ergonomics.  Discussed with patient's caregiver that this is a long process.  I do feel that some of the lumbar spine could be contributing overall and we will try another epidural as well.  Follow-up with me again in 8 weeks.  Update 12/24/2020 TALAYSIA PINHEIRO is a 75 y.o. female coming in with complaint of back and B knee pain. Epidural 10/28/2020.  Patient daughter states she had to be admitted to the hospital for cellulitis and hasn't been very mobile since. Therapy believes the injections will help with getting her more mobility.  Patient is accompanied with daughter who gives [place] the history.  Did review patient's most recent admissions and ED visits from August as well as September.  Patient is having difficulty with swallowing and does have a past medical history significant  for stroke.     Past Medical History:  Diagnosis Date   Asthma    CHF (congestive heart failure) ([place])    hospital 11/2013-- [place]   Chronic kidney disease    Depression    Diabetes mellitus ([place])    GERD (gastroesophageal reflux disease)    Hyperlipidemia    Hypertension    Stroke [facility])    Past Surgical History:  Procedure Laterality Date   CHOLECYSTECTOMY  2002   LOOP RECORDER INSERTION N/A 08/01/2017   Procedure: LOOP RECORDER INSERTION;  Surgeon: Constance Haw, MD;  Location: [place] [facility];  Service: Cardiovascular;  Laterality: N/A;   SPINE SURGERY  aug 2015   [facility]   TEE WITHOUT CARDIOVERSION N/A 08/01/2017   Procedure: TRANSESOPHAGEAL ECHOCARDIOGRAM (TEE);  Surgeon: Sanda Klein, MD;  Location: [facility] ENDOSCOPY;  Service: Cardiovascular;  Laterality: N/A;   TONSILLECTOMY     TUBAL LIGATION     Social History   Socioeconomic History   Marital status: Widowed    Spouse name: Not on file   Number of children: 4   Years of education: 42   Highest education level: 12th grade  Occupational History   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, production: [employer]  Tobacco Use   Smoking status: Former    Packs/day: 1.50    Years: 20.00    Pack years: 30.00    Types: Cigarettes    Quit date: 08/02/1984    Years since quitting: 36.4   Smokeless tobacco: Never  Vaping Use  Vaping Use: Never used  Substance and Sexual Activity   Alcohol use: No   Drug use: No   Sexual activity: Not Currently    Partners: Male  Other Topics Concern   Not on file  Social History Narrative   NO REG EXERCISE   Caffeine use: daily   Social Determinants of Health   Financial Resource Strain: Low Risk    Difficulty of Paying Living Expenses: Not hard at all  Food Insecurity: No Food Insecurity   Worried About Charity fundraiser in the Last Year: Never true   Ran Out of Food in the Last Year: Never true  Transportation Needs: No Transportation Needs    Lack of Transportation (Medical): No   Lack of Transportation (Non-Medical): No  Physical Activity: Inactive   Days of Exercise per Week: 0 days   Minutes of Exercise per Session: 0 min  Stress: No Stress Concern Present   Feeling of Stress : Only a little  Social Connections: Socially Isolated   Frequency of Communication with Friends and Family: More than three times a week   Frequency of Social Gatherings with Friends and Family: More than three times a week   Attends Religious Services: Never   Marine scientist or Organizations: No   Attends Archivist Meetings: Never   Marital Status: Widowed   Allergies  Allergen Reactions   Bee Venom    Family History  Problem Relation Age of Onset   Hypertension Mother    Alzheimer's disease Mother    Hyperlipidemia Mother    Heart disease Father        cad   Asthma Father    Cancer Father 77       leukemia   Stroke Father    Hypertension Father    Heart disease Sister 47       MI   Pulmonary fibrosis Sister    Arthritis Brother    Heart disease Maternal Uncle    Stroke Maternal Uncle    Uterine cancer Paternal Grandmother    Stomach cancer Paternal Grandfather    Heart disease Maternal Uncle    Heart disease Maternal Uncle    Cancer Maternal Aunt        leukemia    Current Outpatient Medications (Endocrine & Metabolic):    Insulin Glargine (BASAGLAR KWIKPEN) 100 UNIT/ML, INJECT 0.4 MLS (40 UNITS TOTAL) INTO THE SKIN AT BEDTIME.  Current Outpatient Medications (Cardiovascular):    atorvastatin (LIPITOR) 40 MG tablet, TAKE 1 TABLET (40 MG TOTAL) BY MOUTH DAILY. PT NEEDS OV FOR FURTHER REFILLS   carvedilol (COREG) 6.25 MG tablet, TAKE 1.5 TABLETS (9.375 MG TOTAL) BY MOUTH 2 (TWO) TIMES DAILY WITH A MEAL.   EPINEPHrine 0.3 mg/0.3 mL IJ SOAJ injection, PLEASE SEE ATTACHED FOR DETAILED DIRECTIONS   fenofibrate micronized (LOFIBRA) 200 MG capsule, Take 1 capsule (200 mg total) by mouth daily. Pt need OV for  further refills   torsemide (DEMADEX) 20 MG tablet, TAKE FOUR TABLETS BY MOUTH TWICE DAILY  Current Outpatient Medications (Respiratory):    albuterol (PROVENTIL) (2.5 MG/3ML) 0.083% nebulizer solution, Take 3 mLs (2.5 mg total) by nebulization every 6 (six) hours as needed for wheezing or shortness of breath.   albuterol (VENTOLIN HFA) 108 (90 Base) MCG/ACT inhaler, Inhale 2 puffs into the lungs every 4 (four) hours as needed for wheezing or shortness of breath.    Fluticasone-Salmeterol (ADVAIR) 250-50 MCG/DOSE AEPB, Inhale 1 puff into the lungs 2 (two) times daily.  Current Outpatient Medications (Analgesics):    Acetaminophen (TYLENOL 8 HOUR PO), Take 500 mg by mouth. (Patient not taking: No sig reported)   allopurinol (ZYLOPRIM) 300 MG tablet, Take 1 tablet (300 mg total) by mouth daily.   HYDROcodone-acetaminophen (NORCO/VICODIN) 5-325 MG tablet, Take 1 tablet by mouth every 4 (four) hours as needed for moderate pain.  Current Outpatient Medications (Hematological):    clopidogrel (PLAVIX) 75 MG tablet, Take 1 tablet (75 mg total) by mouth daily.   ferrous sulfate 325 (65 FE) MG tablet, Take 325 mg by mouth.  Current Outpatient Medications (Other):    AMBULATORY NON FORMULARY MEDICATION, Rollator   BD PEN NEEDLE NANO 2ND GEN 32G X 4 MM MISC, USE 4 TIMES A DAY   busPIRone (BUSPAR) 15 MG tablet, Take 1 tablet (15 mg total) by mouth 3 (three) times daily. Pt needs OV for further refills   diclofenac sodium (VOLTAREN) 1 % GEL, Apply 4 g topically 4 (four) times daily.   doxycycline (VIBRA-TABS) 100 MG tablet, Take 1 tablet (100 mg total) by mouth 2 (two) times daily.   ergocalciferol (VITAMIN D2) 1.25 MG (50000 UT) capsule, Take 1 capsule (50,000 Units total) by mouth once a week. Wednesday   gabapentin (NEURONTIN) 300 MG capsule, Take 1 capsule (300 mg total) by mouth at bedtime.   glucose blood test strip, Use 3x a day - One TOUch Ultra   pantoprazole (PROTONIX) 40 MG tablet, TAKE 1  TABLET BY MOUTH EVERY DAY   pramipexole (MIRAPEX) 1 MG tablet, TAKE 1 TABLET BY MOUTH AT BEDTIME.   sertraline (ZOLOFT) 50 MG tablet, TAKE 1 AND 1/2 TABLETS DAILY BY MOUTH   Reviewed prior external information including notes and imaging from  primary care provider As well as notes that were available from care everywhere and other healthcare systems.  This includes patient most recent emergency room visits  Past medical history, social, surgical and family history all reviewed in electronic medical record.  No pertanent information unless stated regarding to the chief complaint.   Review of Systems:  No headache, visual changes, nausea, vomiting, diarrhea, constipation, dizziness, abdominal pain, skin rash, fevers, chills, night sweats, weight loss, swollen lymph nodes, chest pain, shortness of breath, mood changes. POSITIVE muscle aches, body aches, joint swelling  Objective  Blood pressure 104/62, pulse (!) 41, height 5\' 2"  (1.575 m), SpO2 99 %.   General: Patient is not alert.  Patient is somewhat somnolent. Patient is wearing oxygen nasal cannula. Sitting comfortably though in a wheelchair.  Patient still has pain out of proportion to the amount of palpation of the knees bilaterally.  Patient does have hemosiderin deposits but no significant active cellulitis noted of the right or left lower extremity that I noticed today.  Patient does have pitting edema of the lower extremities.  After informed written and verbal consent, patient was seated on exam table. Right knee was prepped with alcohol swab and utilizing anterolateral approach, patient's right knee space was injected with 4:1  marcaine 0.5%: Kenalog 40mg /dL. Patient tolerated the procedure well without immediate complications.  After informed written and verbal consent, patient was seated on exam table. Left knee was prepped with alcohol swab and utilizing anterolateral approach, patient's left knee space was injected with 4:1   marcaine 0.5%: Kenalog 40mg /dL. Patient tolerated the procedure well without immediate complications.    Impression and Recommendations:     The above documentation has been reviewed and is accurate and complete Lyndal Pulley, DO

## 2020-12-23 NOTE — Therapy (Signed)
Modified Barium Swallow Progress Note  Patient Details  Name: Erin [name] MRN: [id number] Date of Birth: 1946-01-07  Today's Date: 12/23/2020  Modified Barium Swallow completed.  Full report located under Chart Review in the Imaging Section.  Brief recommendations include the following:  Clinical Impression Pt presents with functional oropharyngeal swallow. She accepted small boluses of thin liquid, nectar thick liquid, puree, and solid textures, given at a slow rate.   Orally, pt exhibits brisk oral prep and propulsion, and does not have significant oral residue after the swallow. Trace residue of puree on lingual surface and tongue base, which pt cleared with cued dry swallow. Extended oral prep with graham cracker, likely due to missing dentition.   Pharyngeal swallow is timely, and no penetration or aspiration is seen on any consistency given.  Recommend dys 2 diet, primarily for energy conservation. Thin liquids ok. Meds whole with puree.     Swallow Evaluation Recommendations  SLP Diet Recommendations: Dysphagia 2 (Fine chop) solids;Thin liquid   Liquid Administration via: Straw;Cup   Medication Administration: Whole meds with liquid   Supervision: Patient able to self feed;Intermittent supervision to cue for compensatory strategies   Compensations: Minimize environmental distractions;Slow rate;Small sips/bites   Postural Changes: Seated upright at 90 degrees;Remain semi-upright after after feeds/meals (Comment)   Oral Care Recommendations: Oral care BID     Erin [name] B. Quentin Ore, [facility], [place] Speech Language Pathologist Office: [phone]  Shonna Chock 12/23/2020,1:55 PM

## 2020-12-24 ENCOUNTER — Encounter: Payer: Self-pay | Admitting: Family Medicine

## 2020-12-24 ENCOUNTER — Ambulatory Visit (INDEPENDENT_AMBULATORY_CARE_PROVIDER_SITE_OTHER): Payer: Medicare Other | Admitting: Family Medicine

## 2020-12-24 DIAGNOSIS — I639 Cerebral infarction, unspecified: Secondary | ICD-10-CM | POA: Diagnosis not present

## 2020-12-24 DIAGNOSIS — M170 Bilateral primary osteoarthritis of knee: Secondary | ICD-10-CM | POA: Diagnosis not present

## 2020-12-24 DIAGNOSIS — F0390 Unspecified dementia without behavioral disturbance: Secondary | ICD-10-CM | POA: Diagnosis not present

## 2020-12-24 DIAGNOSIS — L03115 Cellulitis of right lower limb: Secondary | ICD-10-CM

## 2020-12-24 NOTE — Assessment & Plan Note (Signed)
Seems worse from previous exam but has been in the hospital and is now in the nursing facility.

## 2020-12-24 NOTE — Assessment & Plan Note (Signed)
Cellulitis is of the right leg but is very well.  Discussed with patient to continue to use the medication.  Patient though does have significant dementia at baseline at this moment and he does seem to [facility] unfortunately very somnolent secondary to the Depakote.  Patient given the injections today to help with some of the pain which hopefully will help with transferring.  Did discuss with patient's daughter to monitor for worsening of the rash, or any worsening of alertness level that they need to seek medical attention otherwise follow-up with me 10 weeks

## 2020-12-24 NOTE — Patient Instructions (Signed)
Watch the Depakote dosing If she becomes more somnolent please evaluate Injected knees after discussion with family to give some hopeful benefit in pain reduction See you again in 10 weeks

## 2020-12-24 NOTE — Assessment & Plan Note (Signed)
Repeat injections given December 24, 2020 patient having worsening symptoms again.  Patient does seem to be having more failure to thrive and is in a nursing home.  Patient did was significantly somnolent today secondary to recent dosing of Depakote.  Discussed with family members if worsening symptoms to seek medical attention.  Patient can have injections in the knees every 10 weeks if necessary to help with transfers and decrease pain.

## 2020-12-25 DIAGNOSIS — G4733 Obstructive sleep apnea (adult) (pediatric): Secondary | ICD-10-CM | POA: Diagnosis not present

## 2020-12-25 DIAGNOSIS — D61818 Other pancytopenia: Secondary | ICD-10-CM | POA: Diagnosis not present

## 2020-12-25 DIAGNOSIS — I5022 Chronic systolic (congestive) heart failure: Secondary | ICD-10-CM | POA: Diagnosis not present

## 2020-12-25 DIAGNOSIS — R451 Restlessness and agitation: Secondary | ICD-10-CM | POA: Diagnosis not present

## 2020-12-31 DIAGNOSIS — F411 Generalized anxiety disorder: Secondary | ICD-10-CM | POA: Diagnosis not present

## 2020-12-31 DIAGNOSIS — F330 Major depressive disorder, recurrent, mild: Secondary | ICD-10-CM | POA: Diagnosis not present

## 2020-12-31 DIAGNOSIS — F0391 Unspecified dementia with behavioral disturbance: Secondary | ICD-10-CM | POA: Diagnosis not present

## 2021-01-06 DIAGNOSIS — N1832 Chronic kidney disease, stage 3b: Secondary | ICD-10-CM | POA: Diagnosis not present

## 2021-01-06 DIAGNOSIS — R600 Localized edema: Secondary | ICD-10-CM | POA: Diagnosis not present

## 2021-01-06 DIAGNOSIS — M898X9 Other specified disorders of bone, unspecified site: Secondary | ICD-10-CM | POA: Diagnosis not present

## 2021-01-06 DIAGNOSIS — N183 Chronic kidney disease, stage 3 unspecified: Secondary | ICD-10-CM | POA: Diagnosis not present

## 2021-01-06 DIAGNOSIS — E559 Vitamin D deficiency, unspecified: Secondary | ICD-10-CM | POA: Diagnosis not present

## 2021-01-06 DIAGNOSIS — E1122 Type 2 diabetes mellitus with diabetic chronic kidney disease: Secondary | ICD-10-CM | POA: Diagnosis not present

## 2021-01-06 DIAGNOSIS — I129 Hypertensive chronic kidney disease with stage 1 through stage 4 chronic kidney disease, or unspecified chronic kidney disease: Secondary | ICD-10-CM | POA: Diagnosis not present

## 2021-01-06 DIAGNOSIS — D631 Anemia in chronic kidney disease: Secondary | ICD-10-CM | POA: Diagnosis not present

## 2021-01-06 DIAGNOSIS — E8779 Other fluid overload: Secondary | ICD-10-CM | POA: Diagnosis not present

## 2021-01-09 DIAGNOSIS — L03116 Cellulitis of left lower limb: Secondary | ICD-10-CM | POA: Diagnosis not present

## 2021-01-09 DIAGNOSIS — N179 Acute kidney failure, unspecified: Secondary | ICD-10-CM | POA: Diagnosis not present

## 2021-01-09 DIAGNOSIS — R2681 Unsteadiness on feet: Secondary | ICD-10-CM | POA: Diagnosis not present

## 2021-01-09 DIAGNOSIS — L03115 Cellulitis of right lower limb: Secondary | ICD-10-CM | POA: Diagnosis not present

## 2021-01-09 DIAGNOSIS — R1311 Dysphagia, oral phase: Secondary | ICD-10-CM | POA: Diagnosis not present

## 2021-01-09 DIAGNOSIS — M6281 Muscle weakness (generalized): Secondary | ICD-10-CM | POA: Diagnosis not present

## 2021-01-09 DIAGNOSIS — D649 Anemia, unspecified: Secondary | ICD-10-CM | POA: Diagnosis not present

## 2021-01-09 DIAGNOSIS — K219 Gastro-esophageal reflux disease without esophagitis: Secondary | ICD-10-CM | POA: Diagnosis not present

## 2021-01-09 DIAGNOSIS — G4733 Obstructive sleep apnea (adult) (pediatric): Secondary | ICD-10-CM | POA: Diagnosis not present

## 2021-01-09 DIAGNOSIS — J9690 Respiratory failure, unspecified, unspecified whether with hypoxia or hypercapnia: Secondary | ICD-10-CM | POA: Diagnosis not present

## 2021-01-12 DIAGNOSIS — N179 Acute kidney failure, unspecified: Secondary | ICD-10-CM | POA: Diagnosis not present

## 2021-01-12 DIAGNOSIS — R2681 Unsteadiness on feet: Secondary | ICD-10-CM | POA: Diagnosis not present

## 2021-01-12 DIAGNOSIS — M6281 Muscle weakness (generalized): Secondary | ICD-10-CM | POA: Diagnosis not present

## 2021-01-12 DIAGNOSIS — L03115 Cellulitis of right lower limb: Secondary | ICD-10-CM | POA: Diagnosis not present

## 2021-01-12 DIAGNOSIS — L03116 Cellulitis of left lower limb: Secondary | ICD-10-CM | POA: Diagnosis not present

## 2021-01-12 DIAGNOSIS — R1311 Dysphagia, oral phase: Secondary | ICD-10-CM | POA: Diagnosis not present

## 2021-01-13 DIAGNOSIS — N179 Acute kidney failure, unspecified: Secondary | ICD-10-CM | POA: Diagnosis not present

## 2021-01-13 DIAGNOSIS — M6281 Muscle weakness (generalized): Secondary | ICD-10-CM | POA: Diagnosis not present

## 2021-01-13 DIAGNOSIS — L03116 Cellulitis of left lower limb: Secondary | ICD-10-CM | POA: Diagnosis not present

## 2021-01-13 DIAGNOSIS — I10 Essential (primary) hypertension: Secondary | ICD-10-CM | POA: Diagnosis not present

## 2021-01-13 DIAGNOSIS — E119 Type 2 diabetes mellitus without complications: Secondary | ICD-10-CM | POA: Diagnosis not present

## 2021-01-13 DIAGNOSIS — D649 Anemia, unspecified: Secondary | ICD-10-CM | POA: Diagnosis not present

## 2021-01-13 DIAGNOSIS — R1311 Dysphagia, oral phase: Secondary | ICD-10-CM | POA: Diagnosis not present

## 2021-01-13 DIAGNOSIS — R2681 Unsteadiness on feet: Secondary | ICD-10-CM | POA: Diagnosis not present

## 2021-01-13 DIAGNOSIS — E559 Vitamin D deficiency, unspecified: Secondary | ICD-10-CM | POA: Diagnosis not present

## 2021-01-13 DIAGNOSIS — L03115 Cellulitis of right lower limb: Secondary | ICD-10-CM | POA: Diagnosis not present

## 2021-01-14 DIAGNOSIS — L03116 Cellulitis of left lower limb: Secondary | ICD-10-CM | POA: Diagnosis not present

## 2021-01-14 DIAGNOSIS — L03115 Cellulitis of right lower limb: Secondary | ICD-10-CM | POA: Diagnosis not present

## 2021-01-14 DIAGNOSIS — R1311 Dysphagia, oral phase: Secondary | ICD-10-CM | POA: Diagnosis not present

## 2021-01-14 DIAGNOSIS — M6281 Muscle weakness (generalized): Secondary | ICD-10-CM | POA: Diagnosis not present

## 2021-01-14 DIAGNOSIS — R2681 Unsteadiness on feet: Secondary | ICD-10-CM | POA: Diagnosis not present

## 2021-01-14 DIAGNOSIS — N179 Acute kidney failure, unspecified: Secondary | ICD-10-CM | POA: Diagnosis not present

## 2021-01-14 NOTE — Progress Notes (Signed)
[facility]  [address] [place],  [place]  [address] [phone]  Clinic Day:  01/15/2021  Referring physician: Carollee Herter, Alferd Apa, *   This document serves as a record of services personally performed by Hosie Poisson, MD. It was created on their behalf by Curry,Lauren E, a trained medical scribe. The creation of this record is based on the scribe's personal observations and the provider's statements to them.  CHIEF COMPLAINT:  CC:  Myelofibrosis  Current Treatment:  Surveillance   HISTORY OF PRESENT ILLNESS:  Erin [name] is a 75 y.o. female who I saw many years ago for thrombocytosis and she was referred back to me in July of 2018 for thrombocytopenia.  She has also been found to have anemia and both her hemoglobin and platelet count have been decreasing.  Her hemoglobin was down to 9.8 in July 2018 with a platelet count of 74,000. Her white count was elevated at 13,000 with 81% neutrophils, 8% lymphocytes, 9% monocytes, 1% eosinophils, and 1% basophils.  However, when I rechecked these counts on July 26th, I found more of a left shift and her white count was up to 16.6.  Her hemoglobin was a little better at 11.2 with an MCV of 84 and platelet count of 83,000. This time her differential revealed 61% neutrophils, 16% bands, 6% lymphocytes, 3% monocytes, 1% basophils, 5% metamyelocytes, 6% myelocytes and 2% blasts.  A reticulocyte count was 5.4%, sed rate was 93 and LDH was markedly elevated at 1939.  No monoclonal spike was seen.  I reviewed her peripheral blood smear myself and did confirm a rare blast and promyelocyte.  She was understandably anxious since her father had some form of leukemia at age 49 and lived 5 years.  She thinks this may have been chronic myelogenous leukemia.  An aunt also had leukemia.  She does note dyspnea with exertion.  She does see a nephrologist in [facility] with [facility] for chronic kidney disease, Toye  Lufadeju, MD.  She also has venous stasis and has wraps on her legs.  She complains of pain of her lower back and legs at a 6/10.  When I saw her in August 2018, her white count was still elevated at 14.5 with 45% neutrophils, 19% bands, 9% lymphocytes, 1% monocytes, 2% eosinophils, 2% basophils, 8% metamyelocytes, 14% myelocytes and 1 nucleated red blood cell per 100 WBC's.  Her hemoglobin was stable at 11.3 but her platelet count was mildly lower at 74,000 and a smear revealed occasional teardrops.  JAK 2 mutation was negative.  We therefore performed a bone marrow on August 22nd and this was consistent with myelofibrosis.  The cytogenetics revealed normal 56 XX chromosomes and PCR for bcr/ABL was negative.  The sample was very limited as the aspirate was mainly blood.  There was a predominance of granulocytes, red cells and occasional atypical megakaryocytes associated with reticulin fibrosis.  She did not follow up after that.  She has not been seen in the clinic since November 2018.  Since that time she has had a stroke, and she has worsening vascular dementia.  I saw her back in December of 2021 and had been on oral iron for at least 1 year.  Lab work from early December revealed a white count of 7.1, a hemoglobin of 10.0 and a platelet count of 94,000.  Differential revealed 80.9% neutrophils, 9.2% lymphocytes, 8.1% monocytes and 0.8% eosinophils.  By later in December, her hemoglobin  had dropped  to 8.8, her platelet count to 73,000 and her white count was normal.  She was referred back for evaluation and management. Differential revealed 2 nucleated red blood cells, 61% segmented neutrophils, 10% bands, 8% monocytes, 1% eosinophils, and 20% lymphocytes. Chemistries are remarkable for a BUN of 66, a creatinine of 1.5, and an LDH is elevated at 1016. Iron studies were low with a saturation of 11.9%. Therefore, we arranged for IV iron in January 2022.    INTERVAL HISTORY:  [name] is here for routine  follow up and continues to reside a [facility] skilled nursing facility. Most recent lab work from October 11th 2022 revealed a white count of 2.9, hemoglobin was 8.1, and platelet count was 70,000. Chemistries are unremarkable. We will have the nursing facility order iron, TIBC, ferritin and soluble transferrin receptor. She did have pneumonia and cellulitis back in September. 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 She continues oral iron supplement daily, but as she does not seem to be absorbing the supplement, I advised that she discontinue. If needed, we will arrange for IV iron. She notes fatigue. Her  appetite is good. She denies fever, chills or other signs of infection.  She denies nausea, vomiting, bowel issues, or abdominal pain.  She denies sore throat, cough, dyspnea, or chest pain.  REVIEW OF SYSTEMS:  Review of Systems  Constitutional:  Positive for fatigue. Negative for appetite change, chills, fever and unexpected weight change.  HENT:  Negative.    Eyes: Negative.   Respiratory: Negative.  Negative for chest tightness, cough, hemoptysis, shortness of breath and wheezing.   Cardiovascular: Negative.  Negative for chest pain, leg swelling and palpitations.  Gastrointestinal: Negative.  Negative for abdominal distention, abdominal pain, blood in stool, constipation, diarrhea, nausea and vomiting.  Endocrine: Negative.   Genitourinary: Negative.  Negative for difficulty urinating, dysuria, frequency and hematuria.   Musculoskeletal:  Positive for gait problem (in a wheelchair). Negative for arthralgias, back pain, flank pain and myalgias.  Skin: Negative.   Neurological:  Positive for gait problem (in a wheelchair). Negative for dizziness, extremity weakness, headaches, light-headedness, numbness, seizures and speech difficulty.  Hematological: Negative.   Psychiatric/Behavioral: Negative.  Negative for depression and sleep disturbance. The patient is not nervous/anxious.      VITALS:  Blood pressure (!) 167/70, pulse 74, temperature 98.1 F (36.7 C), temperature source Oral, resp. rate 20, height 5\' 2"  (1.575 m), SpO2 92 %.  Wt Readings from Last 3 Encounters:  10/07/20 220 lb (99.8 kg)  08/26/20 215 lb 3.2 oz (97.6 kg)  08/15/20 224 lb (101.6 kg)    Body mass index is 40.24 kg/m.  Performance status (ECOG): 2 - Symptomatic, <50% confined to bed  PHYSICAL EXAM:  Physical Exam Constitutional:      General: She is not in acute distress.    Appearance: Normal appearance. She is normal weight.  HENT:     Head: Normocephalic and atraumatic.  Eyes:     General: No scleral icterus.    Extraocular Movements: Extraocular movements intact.     Conjunctiva/sclera: Conjunctivae normal.     Pupils: Pupils are equal, round, and reactive to light.  Cardiovascular:     Rate and Rhythm: Normal rate and regular rhythm.     Pulses: Normal pulses.     Heart sounds: Normal heart sounds. No murmur heard.   No friction rub. No gallop.  Pulmonary:     Effort: Pulmonary effort is normal. No respiratory distress.     Breath  sounds: Normal breath sounds.  Abdominal:     General: Bowel sounds are normal. There is no distension.     Palpations: Abdomen is soft. There is no hepatomegaly, splenomegaly or mass.     Tenderness: There is abdominal tenderness in the epigastric area.  Musculoskeletal:        General: Normal range of motion.     Cervical back: Normal range of motion and neck supple.     Right lower leg: Edema (1-2+) present.     Left lower leg: Edema (1-2+) present.  Lymphadenopathy:     Cervical: No cervical adenopathy.  Skin:    General: Skin is warm and dry.     Coloration: Skin is pale.  Neurological:     General: No focal deficit present.     Mental Status: She is alert and oriented to person, place, and time. Mental status is at baseline.  Psychiatric:        Mood and Affect: Mood normal.        Behavior: Behavior normal.        Thought Content:  Thought content normal.        Judgment: Judgment normal.    LABS:   CBC Latest Ref Rng & Units 11/27/2020 08/26/2020 04/03/2020  WBC 4.0 - 10.5 K/uL 9.6 8.4 5.3  Hemoglobin 12.0 - 15.0 g/dL 9.5(L) 11.4(L) 8.8(A)  Hematocrit 36.0 - 46.0 % 28.5(L) 34.5(L) 27(A)  Platelets 150.0 - 400.0 K/uL 75.0(L) 92.0(L) 73(A)   CMP Latest Ref Rng & Units 11/27/2020 08/26/2020 04/03/2020  Glucose 70 - 99 mg/dL 91 115(H) -  BUN 6 - 23 mg/dL 76(H) 51(H) 66(A)  Creatinine 0.40 - 1.20 mg/dL 1.70(H) 1.38(H) 1.5(A)  Sodium 135 - 145 mEq/L 135 140 139  Potassium 3.5 - 5.1 mEq/L 3.9 4.6 4.1  Chloride 96 - 112 mEq/L 99 105 105  CO2 19 - 32 mEq/L 22 25 24(A)  Calcium 8.4 - 10.5 mg/dL 8.9 9.1 9.0  Total Protein 6.0 - 8.3 g/dL 6.5 6.5 -  Total Bilirubin 0.2 - 1.2 mg/dL 1.2 0.7 -  Alkaline Phos 39 - 117 U/L 127(H) 110 54  AST 0 - 37 U/L 74(H) 21 36(A)  ALT 0 - 35 U/L 84(H) 17 25     No results found for: Ronnald Ramp, A1GS, A2GS, BETS, BETA2SER, GAMS, MSPIKE, SPEI Lab Results  Component Value Date   TIBC 389 04/03/2020   TIBC 362 07/27/2012   FERRITIN 273.3 08/26/2020   FERRITIN 92.4 04/03/2020   FERRITIN 106.7 07/26/2019   IRONPCTSAT 14.8 (L) 08/26/2020   IRONPCTSAT 15.1 04/03/2020   IRONPCTSAT 11.6 (L) 03/05/2020   Lab Results  Component Value Date   LDH 1,016 (A) 04/03/2020    STUDIES:  DG SWALLOW FUNC OP MEDICARE SPEECH PATH  Result Date: 12/23/2020 Table formatting from the original result was not included. CLINICAL DATA:  Dysphagia. Cough/GE reflux disease/other secondary diagnosis EXAM: MODIFIED BARIUM SWALLOW TECHNIQUE: Different consistencies of barium were administered orally to the patient by the Speech Pathologist. Imaging of the pharynx was performed in the lateral projection. The radiologist was present in the fluoroscopy room for this study, providing personal supervision. FLUOROSCOPY TIME:  Fluoroscopy Time:  1 minute 8 seconds Radiation Exposure Index (if provided by the  fluoroscopic device): 8.54 mGy COMPARISON:  None. FINDINGS/IMPRESSION: Please refer to the Speech Pathologists report for complete details and recommendations. Electronically Signed   By: Vinnie Langton M.D.   On: 12/23/2020 12:54 Objective Swallowing Evaluation: Type of Study: MBS-Modified Barium  Swallow Study  Patient Details Name: MINDA FAAS MRN: [id number] Date of Birth: [birth year] Today's Date: 12/23/2020 Time: 11:00 Past Medical History: Past Medical History: Diagnosis Date  Asthma   CHF (congestive heart failure) [facility])   hospital 11/2013-- [place]  Chronic kidney disease   Depression   Diabetes mellitus ([place])   GERD (gastroesophageal reflux disease)   Hyperlipidemia   Hypertension   Stroke [facility])  Past Surgical History: Past Surgical History: Procedure Laterality Date  CHOLECYSTECTOMY  2002  LOOP RECORDER INSERTION N/A 08/01/2017  Procedure: LOOP RECORDER INSERTION;  Surgeon: Constance Haw, MD;  Location: [facility];  Service: Cardiovascular;  Laterality: N/A;  SPINE SURGERY  aug 2015  [facility]  TEE WITHOUT CARDIOVERSION N/A 08/01/2017  Procedure: TRANSESOPHAGEAL ECHOCARDIOGRAM (TEE);  Surgeon: Sanda Klein, MD;  Location: [facility] ENDOSCOPY;  Service: Cardiovascular;  Laterality: N/A;  TONSILLECTOMY    TUBAL LIGATION   HPI: 75yo female referred for outpatient MBS due to report of difficulty swallowing. PMH: asthma, CHF, CKD, depression, DM, GERD, HLD, HTN, CVA with residual aphasia, PNA (August 2022 during hospitalization for cellulitis)  Subjective: Pt seen in radiology for outpatient MBS. Daughter present. Assessment / Plan / Recommendation CHL IP CLINICAL IMPRESSIONS  12/23/2020 Clinical Impression Pt presents with functional oropharyngeal swallow. She accepted small boluses of thin liquid, nectar thick liquid, puree, and solid textures, given at a slow rate. Orally, pt exhibits brisk oral prep and propulsion, and does not have significant oral residue after the swallow. Trace  residue of puree on lingual surface and tongue base, which pt cleared with cued dry swallow. Extended oral prep with graham cracker, likely due to missing dentition. Pharyngeal swallow is timely, and no penetration or aspiration is seen on any consistency given. Recommend dys 2 diet, primarily for energy conservation. Thin liquids ok. Meds whole with puree.  SLP Visit Diagnosis Dysphagia, oropharyngeal phase (R13.12)     Impact on safety and function Mild aspiration risk   CHL IP TREATMENT RECOMMENDATION 12/23/2020 Treatment Recommendations Defer treatment plan to f/u with SLP   Prognosis 12/23/2020 Prognosis for Safe Diet Advancement Fair     CHL IP DIET RECOMMENDATION 12/23/2020 SLP Diet Recommendations Dysphagia 2 (Fine chop) solids;Thin liquid Liquid Administration via Straw;Cup Medication Administration Whole meds with liquid Compensations Minimize environmental distractions;Slow rate;Small sips/bites Postural Changes Seated upright at 90 degrees;Remain semi-upright after after feeds/meals (Comment)   CHL IP OTHER RECOMMENDATIONS 12/23/2020   Oral Care Recommendations Oral care BID     CHL IP FOLLOW UP RECOMMENDATIONS 12/23/2020 Follow up Recommendations 24 hour supervision/assistance        CHL IP ORAL PHASE 12/23/2020 Oral Phase WFL   CHL IP PHARYNGEAL PHASE 12/23/2020 Pharyngeal Phase WFL   CHL IP CERVICAL ESOPHAGEAL PHASE 12/23/2020 Cervical Esophageal Phase WFL Celia B. Quentin Ore, [facility], [place] Speech Language Pathologist Office: [phone] Shonna Chock 12/23/2020, 1:47 PM                  HISTORY:   Allergies:  Allergies  Allergen Reactions   Bee Venom     Current Medications: Current Outpatient Medications  Medication Sig Dispense Refill   Acetaminophen 500 MG capsule Take 1 capsule by mouth every 8 (eight) hours as needed.     Acetaminophen (TYLENOL 8 HOUR PO) Take 500 mg by mouth. (Patient not taking: No sig reported)     albuterol (PROVENTIL) (2.5 MG/3ML) 0.083% nebulizer solution Take 3  mLs (2.5 mg total) by nebulization every 6 (six) hours as  needed for wheezing or shortness of breath. 75 mL 12   albuterol (VENTOLIN HFA) 108 (90 Base) MCG/ACT inhaler Inhale 2 puffs into the lungs every 4 (four) hours as needed for wheezing or shortness of breath.      allopurinol (ZYLOPRIM) 300 MG tablet Take 1 tablet (300 mg total) by mouth daily. 90 tablet 3   AMBULATORY NON FORMULARY MEDICATION Rollator 1 Units 0   atorvastatin (LIPITOR) 40 MG tablet TAKE 1 TABLET (40 MG TOTAL) BY MOUTH DAILY. PT NEEDS OV FOR FURTHER REFILLS 90 tablet 1   busPIRone (BUSPAR) 15 MG tablet Take 1 tablet (15 mg total) by mouth 3 (three) times daily. Pt needs OV for further refills 270 tablet 0   carvedilol (COREG) 6.25 MG tablet TAKE 1.5 TABLETS (9.375 MG TOTAL) BY MOUTH 2 (TWO) TIMES DAILY WITH A MEAL. 170 tablet 1   diclofenac sodium (VOLTAREN) 1 % GEL Apply 4 g topically 4 (four) times daily. 100 g 2   doxycycline (VIBRA-TABS) 100 MG tablet Take 1 tablet (100 mg total) by mouth 2 (two) times daily. 20 tablet 0   ferrous sulfate 325 (65 FE) MG tablet Take 325 mg by mouth.     gabapentin (NEURONTIN) 300 MG capsule Take 1 capsule (300 mg total) by mouth at bedtime. 90 capsule 3   glucose blood test strip Use 3x a day - One TOUch Ultra 300 each 3   HYDROcodone-acetaminophen (NORCO/VICODIN) 5-325 MG tablet Take 1 tablet by mouth every 4 (four) hours as needed for moderate pain.     pantoprazole (PROTONIX) 40 MG tablet TAKE 1 TABLET BY MOUTH EVERY DAY 90 tablet 1   pramipexole (MIRAPEX) 1 MG tablet TAKE 1 TABLET BY MOUTH AT BEDTIME. 90 tablet 1   sertraline (ZOLOFT) 50 MG tablet TAKE 1 AND 1/2 TABLETS DAILY BY MOUTH 135 tablet 1   torsemide (DEMADEX) 20 MG tablet TAKE FOUR TABLETS BY MOUTH TWICE DAILY     No current facility-administered medications for this visit.     ASSESSMENT & PLAN:   Assessment:   1.  Myelofibrosis, diagnosed in August 2018. This likely will not threaten her life, and so routine  surveillance is recommended.  She has pancytopenia, but her counts have remained in a fairly stable range. I do expect that her blood counts will slowly worsen over time and there is a slight possibility of evolution into leukemia.   2.  Iron deficiency which did not respond to oral iron supplement and is aggravated by her myelofibrosis.  She received 2 doses of IV iron in January 2022 with improvement. As her counts are low again, we will have the nursing facility draw repeat iron studies. If needed, we can arrange for IV iron again.  3. Pneumonia and cellulitis in September 2022. She has been placed on doxycycline again for recurrent cellulitis.  4. Epigastric tenderness, she may benefit from increase in her pantoprazole to 40 mg bid.  5. Chronic kidney disease.  6. Hypocalcemia.  Plan: This is a 75 year old female who I have seen previously with myelofibrosis and multiple comorbidities who is here to continue follow up at our facility.  I again reviewed her diagnosis and its symptoms.  At this time, I would recommend surveillance to monitor her blood counts, which have remained fairly stable over the past few years.  I do feel she may have some degree of iron deficiency.  This is likely aggravated by her myelofibrosis.  There is no evidence of hemolysis and  we have ruled out other deficiency. She received two doses of IV iron in January.  I will ask [facility] to draw iron studies next week so we can decide if she needs to repeat this.  I will see her back in 4 months with repeat CBC and comprehensive metabolic profile.  She understands and agrees with this plan of care.  I provided 25 minutes of face-to-face time during this this encounter and > 50% was spent counseling as documented under my assessment and plan.    ADDENDUM: The current iron studies are not consistent with iron deficiency with an iron level of 58, TIBC 247 and iron saturation of 23.4.  Her ferritin is 313.90.  I therefore think  the worsening blood counts reflect progression of her myelofibrosis.   Derwood Kaplan, MD [facility] [address] [place] [address] Dept: [phone] Dept Fax: [phone]   I, Rita Ohara, am acting as scribe for Derwood Kaplan, MD  I have reviewed this report as typed by the medical scribe, and it is complete and accurate.

## 2021-01-15 ENCOUNTER — Encounter: Payer: Self-pay | Admitting: Oncology

## 2021-01-15 ENCOUNTER — Inpatient Hospital Stay: Payer: Medicare Other | Attending: Oncology | Admitting: Oncology

## 2021-01-15 ENCOUNTER — Other Ambulatory Visit: Payer: Self-pay | Admitting: Oncology

## 2021-01-15 VITALS — BP 167/70 | HR 74 | Temp 98.1°F | Resp 20 | Ht 62.0 in

## 2021-01-15 DIAGNOSIS — N179 Acute kidney failure, unspecified: Secondary | ICD-10-CM | POA: Diagnosis not present

## 2021-01-15 DIAGNOSIS — D473 Essential (hemorrhagic) thrombocythemia: Secondary | ICD-10-CM

## 2021-01-15 DIAGNOSIS — D474 Osteomyelofibrosis: Secondary | ICD-10-CM | POA: Diagnosis not present

## 2021-01-15 DIAGNOSIS — M6281 Muscle weakness (generalized): Secondary | ICD-10-CM | POA: Diagnosis not present

## 2021-01-15 DIAGNOSIS — L03116 Cellulitis of left lower limb: Secondary | ICD-10-CM | POA: Diagnosis not present

## 2021-01-15 DIAGNOSIS — L03115 Cellulitis of right lower limb: Secondary | ICD-10-CM | POA: Diagnosis not present

## 2021-01-15 DIAGNOSIS — R2681 Unsteadiness on feet: Secondary | ICD-10-CM | POA: Diagnosis not present

## 2021-01-15 DIAGNOSIS — R1311 Dysphagia, oral phase: Secondary | ICD-10-CM | POA: Diagnosis not present

## 2021-01-16 ENCOUNTER — Other Ambulatory Visit: Payer: Self-pay | Admitting: Oncology

## 2021-01-16 ENCOUNTER — Other Ambulatory Visit: Payer: Self-pay

## 2021-01-16 DIAGNOSIS — L03116 Cellulitis of left lower limb: Secondary | ICD-10-CM | POA: Diagnosis not present

## 2021-01-16 DIAGNOSIS — R2681 Unsteadiness on feet: Secondary | ICD-10-CM | POA: Diagnosis not present

## 2021-01-16 DIAGNOSIS — I5032 Chronic diastolic (congestive) heart failure: Secondary | ICD-10-CM | POA: Diagnosis not present

## 2021-01-16 DIAGNOSIS — D474 Osteomyelofibrosis: Secondary | ICD-10-CM

## 2021-01-16 DIAGNOSIS — D649 Anemia, unspecified: Secondary | ICD-10-CM | POA: Diagnosis not present

## 2021-01-16 DIAGNOSIS — N179 Acute kidney failure, unspecified: Secondary | ICD-10-CM | POA: Diagnosis not present

## 2021-01-16 DIAGNOSIS — L03115 Cellulitis of right lower limb: Secondary | ICD-10-CM | POA: Diagnosis not present

## 2021-01-16 DIAGNOSIS — D473 Essential (hemorrhagic) thrombocythemia: Secondary | ICD-10-CM

## 2021-01-16 DIAGNOSIS — R1311 Dysphagia, oral phase: Secondary | ICD-10-CM | POA: Diagnosis not present

## 2021-01-16 DIAGNOSIS — M6281 Muscle weakness (generalized): Secondary | ICD-10-CM | POA: Diagnosis not present

## 2021-01-16 DIAGNOSIS — E119 Type 2 diabetes mellitus without complications: Secondary | ICD-10-CM | POA: Diagnosis not present

## 2021-01-17 DIAGNOSIS — D649 Anemia, unspecified: Secondary | ICD-10-CM | POA: Diagnosis not present

## 2021-01-19 DIAGNOSIS — L03115 Cellulitis of right lower limb: Secondary | ICD-10-CM | POA: Diagnosis not present

## 2021-01-19 DIAGNOSIS — N179 Acute kidney failure, unspecified: Secondary | ICD-10-CM | POA: Diagnosis not present

## 2021-01-19 DIAGNOSIS — M6281 Muscle weakness (generalized): Secondary | ICD-10-CM | POA: Diagnosis not present

## 2021-01-19 DIAGNOSIS — R2681 Unsteadiness on feet: Secondary | ICD-10-CM | POA: Diagnosis not present

## 2021-01-19 DIAGNOSIS — R1311 Dysphagia, oral phase: Secondary | ICD-10-CM | POA: Diagnosis not present

## 2021-01-19 DIAGNOSIS — L03116 Cellulitis of left lower limb: Secondary | ICD-10-CM | POA: Diagnosis not present

## 2021-01-20 ENCOUNTER — Encounter: Payer: Self-pay | Admitting: Family Medicine

## 2021-01-20 ENCOUNTER — Telehealth: Payer: Self-pay

## 2021-01-20 DIAGNOSIS — M6281 Muscle weakness (generalized): Secondary | ICD-10-CM | POA: Diagnosis not present

## 2021-01-20 DIAGNOSIS — N179 Acute kidney failure, unspecified: Secondary | ICD-10-CM | POA: Diagnosis not present

## 2021-01-20 DIAGNOSIS — L03116 Cellulitis of left lower limb: Secondary | ICD-10-CM | POA: Diagnosis not present

## 2021-01-20 DIAGNOSIS — R1311 Dysphagia, oral phase: Secondary | ICD-10-CM | POA: Diagnosis not present

## 2021-01-20 DIAGNOSIS — L03115 Cellulitis of right lower limb: Secondary | ICD-10-CM | POA: Diagnosis not present

## 2021-01-20 DIAGNOSIS — R2681 Unsteadiness on feet: Secondary | ICD-10-CM | POA: Diagnosis not present

## 2021-01-20 NOTE — Telephone Encounter (Signed)
Caryl Pina, LPN at [facility] in [place]

## 2021-01-20 NOTE — Telephone Encounter (Signed)
-----   Message from Derwood Kaplan, MD sent at 01/19/2021  7:38 PM EDT ----- Regarding: call Tell her family that iron levels are okay, we can stop the pills and won't need IV iron, will monitor Can let [facility] know she is anemic due to myelofibrosis and won't benefit from oral iron.

## 2021-01-21 DIAGNOSIS — R2681 Unsteadiness on feet: Secondary | ICD-10-CM | POA: Diagnosis not present

## 2021-01-21 DIAGNOSIS — N179 Acute kidney failure, unspecified: Secondary | ICD-10-CM | POA: Diagnosis not present

## 2021-01-21 DIAGNOSIS — M6281 Muscle weakness (generalized): Secondary | ICD-10-CM | POA: Diagnosis not present

## 2021-01-21 DIAGNOSIS — L03116 Cellulitis of left lower limb: Secondary | ICD-10-CM | POA: Diagnosis not present

## 2021-01-21 DIAGNOSIS — R1311 Dysphagia, oral phase: Secondary | ICD-10-CM | POA: Diagnosis not present

## 2021-01-21 DIAGNOSIS — L03115 Cellulitis of right lower limb: Secondary | ICD-10-CM | POA: Diagnosis not present

## 2021-01-22 ENCOUNTER — Encounter: Payer: Self-pay | Admitting: Oncology

## 2021-01-22 DIAGNOSIS — N179 Acute kidney failure, unspecified: Secondary | ICD-10-CM | POA: Diagnosis not present

## 2021-01-22 DIAGNOSIS — M6281 Muscle weakness (generalized): Secondary | ICD-10-CM | POA: Diagnosis not present

## 2021-01-22 DIAGNOSIS — R1311 Dysphagia, oral phase: Secondary | ICD-10-CM | POA: Diagnosis not present

## 2021-01-22 DIAGNOSIS — R2681 Unsteadiness on feet: Secondary | ICD-10-CM | POA: Diagnosis not present

## 2021-01-22 DIAGNOSIS — L03116 Cellulitis of left lower limb: Secondary | ICD-10-CM | POA: Diagnosis not present

## 2021-01-22 DIAGNOSIS — L03115 Cellulitis of right lower limb: Secondary | ICD-10-CM | POA: Diagnosis not present

## 2021-01-23 DIAGNOSIS — L03115 Cellulitis of right lower limb: Secondary | ICD-10-CM | POA: Diagnosis not present

## 2021-01-23 DIAGNOSIS — L03116 Cellulitis of left lower limb: Secondary | ICD-10-CM | POA: Diagnosis not present

## 2021-01-23 DIAGNOSIS — M6281 Muscle weakness (generalized): Secondary | ICD-10-CM | POA: Diagnosis not present

## 2021-01-23 DIAGNOSIS — N179 Acute kidney failure, unspecified: Secondary | ICD-10-CM | POA: Diagnosis not present

## 2021-01-23 DIAGNOSIS — R2681 Unsteadiness on feet: Secondary | ICD-10-CM | POA: Diagnosis not present

## 2021-01-23 DIAGNOSIS — R1311 Dysphagia, oral phase: Secondary | ICD-10-CM | POA: Diagnosis not present

## 2021-01-26 DIAGNOSIS — L03116 Cellulitis of left lower limb: Secondary | ICD-10-CM | POA: Diagnosis not present

## 2021-01-26 DIAGNOSIS — L03115 Cellulitis of right lower limb: Secondary | ICD-10-CM | POA: Diagnosis not present

## 2021-01-26 DIAGNOSIS — R2681 Unsteadiness on feet: Secondary | ICD-10-CM | POA: Diagnosis not present

## 2021-01-26 DIAGNOSIS — R1311 Dysphagia, oral phase: Secondary | ICD-10-CM | POA: Diagnosis not present

## 2021-01-26 DIAGNOSIS — M6281 Muscle weakness (generalized): Secondary | ICD-10-CM | POA: Diagnosis not present

## 2021-01-26 DIAGNOSIS — N179 Acute kidney failure, unspecified: Secondary | ICD-10-CM | POA: Diagnosis not present

## 2021-01-27 DIAGNOSIS — L03115 Cellulitis of right lower limb: Secondary | ICD-10-CM | POA: Diagnosis not present

## 2021-01-27 DIAGNOSIS — L03116 Cellulitis of left lower limb: Secondary | ICD-10-CM | POA: Diagnosis not present

## 2021-01-27 DIAGNOSIS — R2681 Unsteadiness on feet: Secondary | ICD-10-CM | POA: Diagnosis not present

## 2021-01-27 DIAGNOSIS — R1311 Dysphagia, oral phase: Secondary | ICD-10-CM | POA: Diagnosis not present

## 2021-01-27 DIAGNOSIS — N179 Acute kidney failure, unspecified: Secondary | ICD-10-CM | POA: Diagnosis not present

## 2021-01-27 DIAGNOSIS — M6281 Muscle weakness (generalized): Secondary | ICD-10-CM | POA: Diagnosis not present

## 2021-01-28 DIAGNOSIS — R2681 Unsteadiness on feet: Secondary | ICD-10-CM | POA: Diagnosis not present

## 2021-01-28 DIAGNOSIS — N179 Acute kidney failure, unspecified: Secondary | ICD-10-CM | POA: Diagnosis not present

## 2021-01-28 DIAGNOSIS — R1311 Dysphagia, oral phase: Secondary | ICD-10-CM | POA: Diagnosis not present

## 2021-01-28 DIAGNOSIS — M6281 Muscle weakness (generalized): Secondary | ICD-10-CM | POA: Diagnosis not present

## 2021-01-28 DIAGNOSIS — L03115 Cellulitis of right lower limb: Secondary | ICD-10-CM | POA: Diagnosis not present

## 2021-01-28 DIAGNOSIS — L03116 Cellulitis of left lower limb: Secondary | ICD-10-CM | POA: Diagnosis not present

## 2021-01-29 DIAGNOSIS — N179 Acute kidney failure, unspecified: Secondary | ICD-10-CM | POA: Diagnosis not present

## 2021-01-29 DIAGNOSIS — R2681 Unsteadiness on feet: Secondary | ICD-10-CM | POA: Diagnosis not present

## 2021-01-29 DIAGNOSIS — L03115 Cellulitis of right lower limb: Secondary | ICD-10-CM | POA: Diagnosis not present

## 2021-01-29 DIAGNOSIS — M6281 Muscle weakness (generalized): Secondary | ICD-10-CM | POA: Diagnosis not present

## 2021-01-29 DIAGNOSIS — R1311 Dysphagia, oral phase: Secondary | ICD-10-CM | POA: Diagnosis not present

## 2021-01-29 DIAGNOSIS — L03116 Cellulitis of left lower limb: Secondary | ICD-10-CM | POA: Diagnosis not present

## 2021-01-30 DIAGNOSIS — L03115 Cellulitis of right lower limb: Secondary | ICD-10-CM | POA: Diagnosis not present

## 2021-01-30 DIAGNOSIS — N179 Acute kidney failure, unspecified: Secondary | ICD-10-CM | POA: Diagnosis not present

## 2021-01-30 DIAGNOSIS — R1311 Dysphagia, oral phase: Secondary | ICD-10-CM | POA: Diagnosis not present

## 2021-01-30 DIAGNOSIS — L03116 Cellulitis of left lower limb: Secondary | ICD-10-CM | POA: Diagnosis not present

## 2021-01-30 DIAGNOSIS — M6281 Muscle weakness (generalized): Secondary | ICD-10-CM | POA: Diagnosis not present

## 2021-01-30 DIAGNOSIS — R2681 Unsteadiness on feet: Secondary | ICD-10-CM | POA: Diagnosis not present

## 2021-02-02 DIAGNOSIS — L03116 Cellulitis of left lower limb: Secondary | ICD-10-CM | POA: Diagnosis not present

## 2021-02-02 DIAGNOSIS — R2681 Unsteadiness on feet: Secondary | ICD-10-CM | POA: Diagnosis not present

## 2021-02-02 DIAGNOSIS — L03115 Cellulitis of right lower limb: Secondary | ICD-10-CM | POA: Diagnosis not present

## 2021-02-02 DIAGNOSIS — N179 Acute kidney failure, unspecified: Secondary | ICD-10-CM | POA: Diagnosis not present

## 2021-02-02 DIAGNOSIS — M6281 Muscle weakness (generalized): Secondary | ICD-10-CM | POA: Diagnosis not present

## 2021-02-02 DIAGNOSIS — R1311 Dysphagia, oral phase: Secondary | ICD-10-CM | POA: Diagnosis not present

## 2021-02-03 DIAGNOSIS — R1311 Dysphagia, oral phase: Secondary | ICD-10-CM | POA: Diagnosis not present

## 2021-02-03 DIAGNOSIS — N179 Acute kidney failure, unspecified: Secondary | ICD-10-CM | POA: Diagnosis not present

## 2021-02-03 DIAGNOSIS — J9690 Respiratory failure, unspecified, unspecified whether with hypoxia or hypercapnia: Secondary | ICD-10-CM | POA: Diagnosis not present

## 2021-02-03 DIAGNOSIS — R2681 Unsteadiness on feet: Secondary | ICD-10-CM | POA: Diagnosis not present

## 2021-02-03 DIAGNOSIS — D649 Anemia, unspecified: Secondary | ICD-10-CM | POA: Diagnosis not present

## 2021-02-03 DIAGNOSIS — G4733 Obstructive sleep apnea (adult) (pediatric): Secondary | ICD-10-CM | POA: Diagnosis not present

## 2021-02-03 DIAGNOSIS — L03115 Cellulitis of right lower limb: Secondary | ICD-10-CM | POA: Diagnosis not present

## 2021-02-03 DIAGNOSIS — M6281 Muscle weakness (generalized): Secondary | ICD-10-CM | POA: Diagnosis not present

## 2021-02-03 DIAGNOSIS — K219 Gastro-esophageal reflux disease without esophagitis: Secondary | ICD-10-CM | POA: Diagnosis not present

## 2021-02-04 DIAGNOSIS — K219 Gastro-esophageal reflux disease without esophagitis: Secondary | ICD-10-CM | POA: Diagnosis not present

## 2021-02-04 DIAGNOSIS — J9690 Respiratory failure, unspecified, unspecified whether with hypoxia or hypercapnia: Secondary | ICD-10-CM | POA: Diagnosis not present

## 2021-02-04 DIAGNOSIS — L03115 Cellulitis of right lower limb: Secondary | ICD-10-CM | POA: Diagnosis not present

## 2021-02-04 DIAGNOSIS — R2681 Unsteadiness on feet: Secondary | ICD-10-CM | POA: Diagnosis not present

## 2021-02-04 DIAGNOSIS — R1311 Dysphagia, oral phase: Secondary | ICD-10-CM | POA: Diagnosis not present

## 2021-02-04 DIAGNOSIS — M6281 Muscle weakness (generalized): Secondary | ICD-10-CM | POA: Diagnosis not present

## 2021-02-05 DIAGNOSIS — L03115 Cellulitis of right lower limb: Secondary | ICD-10-CM | POA: Diagnosis not present

## 2021-02-05 DIAGNOSIS — R2681 Unsteadiness on feet: Secondary | ICD-10-CM | POA: Diagnosis not present

## 2021-02-05 DIAGNOSIS — K219 Gastro-esophageal reflux disease without esophagitis: Secondary | ICD-10-CM | POA: Diagnosis not present

## 2021-02-05 DIAGNOSIS — M6281 Muscle weakness (generalized): Secondary | ICD-10-CM | POA: Diagnosis not present

## 2021-02-05 DIAGNOSIS — R1311 Dysphagia, oral phase: Secondary | ICD-10-CM | POA: Diagnosis not present

## 2021-02-05 DIAGNOSIS — J9690 Respiratory failure, unspecified, unspecified whether with hypoxia or hypercapnia: Secondary | ICD-10-CM | POA: Diagnosis not present

## 2021-02-06 DIAGNOSIS — J9690 Respiratory failure, unspecified, unspecified whether with hypoxia or hypercapnia: Secondary | ICD-10-CM | POA: Diagnosis not present

## 2021-02-06 DIAGNOSIS — R2681 Unsteadiness on feet: Secondary | ICD-10-CM | POA: Diagnosis not present

## 2021-02-06 DIAGNOSIS — M6281 Muscle weakness (generalized): Secondary | ICD-10-CM | POA: Diagnosis not present

## 2021-02-06 DIAGNOSIS — L03115 Cellulitis of right lower limb: Secondary | ICD-10-CM | POA: Diagnosis not present

## 2021-02-06 DIAGNOSIS — R1311 Dysphagia, oral phase: Secondary | ICD-10-CM | POA: Diagnosis not present

## 2021-02-06 DIAGNOSIS — K219 Gastro-esophageal reflux disease without esophagitis: Secondary | ICD-10-CM | POA: Diagnosis not present

## 2021-02-09 DIAGNOSIS — R2681 Unsteadiness on feet: Secondary | ICD-10-CM | POA: Diagnosis not present

## 2021-02-09 DIAGNOSIS — L03115 Cellulitis of right lower limb: Secondary | ICD-10-CM | POA: Diagnosis not present

## 2021-02-09 DIAGNOSIS — K219 Gastro-esophageal reflux disease without esophagitis: Secondary | ICD-10-CM | POA: Diagnosis not present

## 2021-02-09 DIAGNOSIS — M6281 Muscle weakness (generalized): Secondary | ICD-10-CM | POA: Diagnosis not present

## 2021-02-09 DIAGNOSIS — J9690 Respiratory failure, unspecified, unspecified whether with hypoxia or hypercapnia: Secondary | ICD-10-CM | POA: Diagnosis not present

## 2021-02-09 DIAGNOSIS — R1311 Dysphagia, oral phase: Secondary | ICD-10-CM | POA: Diagnosis not present

## 2021-02-12 DIAGNOSIS — K219 Gastro-esophageal reflux disease without esophagitis: Secondary | ICD-10-CM | POA: Diagnosis not present

## 2021-02-12 DIAGNOSIS — R2681 Unsteadiness on feet: Secondary | ICD-10-CM | POA: Diagnosis not present

## 2021-02-12 DIAGNOSIS — R1311 Dysphagia, oral phase: Secondary | ICD-10-CM | POA: Diagnosis not present

## 2021-02-12 DIAGNOSIS — J9690 Respiratory failure, unspecified, unspecified whether with hypoxia or hypercapnia: Secondary | ICD-10-CM | POA: Diagnosis not present

## 2021-02-12 DIAGNOSIS — L03115 Cellulitis of right lower limb: Secondary | ICD-10-CM | POA: Diagnosis not present

## 2021-02-12 DIAGNOSIS — M6281 Muscle weakness (generalized): Secondary | ICD-10-CM | POA: Diagnosis not present

## 2021-02-20 DIAGNOSIS — L97829 Non-pressure chronic ulcer of other part of left lower leg with unspecified severity: Secondary | ICD-10-CM | POA: Diagnosis not present

## 2021-02-20 DIAGNOSIS — F32A Depression, unspecified: Secondary | ICD-10-CM | POA: Diagnosis not present

## 2021-02-20 DIAGNOSIS — J449 Chronic obstructive pulmonary disease, unspecified: Secondary | ICD-10-CM | POA: Diagnosis not present

## 2021-02-20 DIAGNOSIS — E785 Hyperlipidemia, unspecified: Secondary | ICD-10-CM | POA: Diagnosis not present

## 2021-02-20 DIAGNOSIS — F419 Anxiety disorder, unspecified: Secondary | ICD-10-CM | POA: Diagnosis not present

## 2021-02-20 DIAGNOSIS — M1990 Unspecified osteoarthritis, unspecified site: Secondary | ICD-10-CM | POA: Diagnosis not present

## 2021-02-20 DIAGNOSIS — D696 Thrombocytopenia, unspecified: Secondary | ICD-10-CM | POA: Diagnosis not present

## 2021-02-20 DIAGNOSIS — E1122 Type 2 diabetes mellitus with diabetic chronic kidney disease: Secondary | ICD-10-CM | POA: Diagnosis not present

## 2021-02-20 DIAGNOSIS — E559 Vitamin D deficiency, unspecified: Secondary | ICD-10-CM | POA: Diagnosis not present

## 2021-02-20 DIAGNOSIS — G4733 Obstructive sleep apnea (adult) (pediatric): Secondary | ICD-10-CM | POA: Diagnosis not present

## 2021-02-20 DIAGNOSIS — Z6822 Body mass index (BMI) 22.0-22.9, adult: Secondary | ICD-10-CM | POA: Diagnosis not present

## 2021-02-20 DIAGNOSIS — G2581 Restless legs syndrome: Secondary | ICD-10-CM | POA: Diagnosis not present

## 2021-02-20 DIAGNOSIS — E1151 Type 2 diabetes mellitus with diabetic peripheral angiopathy without gangrene: Secondary | ICD-10-CM | POA: Diagnosis not present

## 2021-02-20 DIAGNOSIS — I83028 Varicose veins of left lower extremity with ulcer other part of lower leg: Secondary | ICD-10-CM | POA: Diagnosis not present

## 2021-02-20 DIAGNOSIS — E538 Deficiency of other specified B group vitamins: Secondary | ICD-10-CM | POA: Diagnosis not present

## 2021-02-20 DIAGNOSIS — N189 Chronic kidney disease, unspecified: Secondary | ICD-10-CM | POA: Diagnosis not present

## 2021-02-20 DIAGNOSIS — L97819 Non-pressure chronic ulcer of other part of right lower leg with unspecified severity: Secondary | ICD-10-CM | POA: Diagnosis not present

## 2021-02-20 DIAGNOSIS — K219 Gastro-esophageal reflux disease without esophagitis: Secondary | ICD-10-CM | POA: Diagnosis not present

## 2021-02-20 DIAGNOSIS — I69320 Aphasia following cerebral infarction: Secondary | ICD-10-CM | POA: Diagnosis not present

## 2021-02-20 DIAGNOSIS — I5032 Chronic diastolic (congestive) heart failure: Secondary | ICD-10-CM | POA: Diagnosis not present

## 2021-02-20 DIAGNOSIS — Z794 Long term (current) use of insulin: Secondary | ICD-10-CM | POA: Diagnosis not present

## 2021-02-20 DIAGNOSIS — I130 Hypertensive heart and chronic kidney disease with heart failure and stage 1 through stage 4 chronic kidney disease, or unspecified chronic kidney disease: Secondary | ICD-10-CM | POA: Diagnosis not present

## 2021-02-20 DIAGNOSIS — I83018 Varicose veins of right lower extremity with ulcer other part of lower leg: Secondary | ICD-10-CM | POA: Diagnosis not present

## 2021-02-20 DIAGNOSIS — M1030 Gout due to renal impairment, unspecified site: Secondary | ICD-10-CM | POA: Diagnosis not present

## 2021-02-20 DIAGNOSIS — E669 Obesity, unspecified: Secondary | ICD-10-CM | POA: Diagnosis not present

## 2021-02-21 DIAGNOSIS — I83028 Varicose veins of left lower extremity with ulcer other part of lower leg: Secondary | ICD-10-CM | POA: Diagnosis not present

## 2021-02-21 DIAGNOSIS — L97819 Non-pressure chronic ulcer of other part of right lower leg with unspecified severity: Secondary | ICD-10-CM | POA: Diagnosis not present

## 2021-02-21 DIAGNOSIS — E1151 Type 2 diabetes mellitus with diabetic peripheral angiopathy without gangrene: Secondary | ICD-10-CM | POA: Diagnosis not present

## 2021-02-21 DIAGNOSIS — L97829 Non-pressure chronic ulcer of other part of left lower leg with unspecified severity: Secondary | ICD-10-CM | POA: Diagnosis not present

## 2021-02-21 DIAGNOSIS — I130 Hypertensive heart and chronic kidney disease with heart failure and stage 1 through stage 4 chronic kidney disease, or unspecified chronic kidney disease: Secondary | ICD-10-CM | POA: Diagnosis not present

## 2021-02-21 DIAGNOSIS — I83018 Varicose veins of right lower extremity with ulcer other part of lower leg: Secondary | ICD-10-CM | POA: Diagnosis not present

## 2021-02-24 DIAGNOSIS — I130 Hypertensive heart and chronic kidney disease with heart failure and stage 1 through stage 4 chronic kidney disease, or unspecified chronic kidney disease: Secondary | ICD-10-CM | POA: Diagnosis not present

## 2021-02-24 DIAGNOSIS — I83018 Varicose veins of right lower extremity with ulcer other part of lower leg: Secondary | ICD-10-CM | POA: Diagnosis not present

## 2021-02-24 DIAGNOSIS — I83028 Varicose veins of left lower extremity with ulcer other part of lower leg: Secondary | ICD-10-CM | POA: Diagnosis not present

## 2021-02-24 DIAGNOSIS — E1151 Type 2 diabetes mellitus with diabetic peripheral angiopathy without gangrene: Secondary | ICD-10-CM | POA: Diagnosis not present

## 2021-02-24 DIAGNOSIS — L97819 Non-pressure chronic ulcer of other part of right lower leg with unspecified severity: Secondary | ICD-10-CM | POA: Diagnosis not present

## 2021-02-24 DIAGNOSIS — L97829 Non-pressure chronic ulcer of other part of left lower leg with unspecified severity: Secondary | ICD-10-CM | POA: Diagnosis not present

## 2021-02-25 DIAGNOSIS — L97819 Non-pressure chronic ulcer of other part of right lower leg with unspecified severity: Secondary | ICD-10-CM | POA: Diagnosis not present

## 2021-02-25 DIAGNOSIS — E1151 Type 2 diabetes mellitus with diabetic peripheral angiopathy without gangrene: Secondary | ICD-10-CM | POA: Diagnosis not present

## 2021-02-25 DIAGNOSIS — I130 Hypertensive heart and chronic kidney disease with heart failure and stage 1 through stage 4 chronic kidney disease, or unspecified chronic kidney disease: Secondary | ICD-10-CM | POA: Diagnosis not present

## 2021-02-25 DIAGNOSIS — I83028 Varicose veins of left lower extremity with ulcer other part of lower leg: Secondary | ICD-10-CM | POA: Diagnosis not present

## 2021-02-25 DIAGNOSIS — L97829 Non-pressure chronic ulcer of other part of left lower leg with unspecified severity: Secondary | ICD-10-CM | POA: Diagnosis not present

## 2021-02-25 DIAGNOSIS — I83018 Varicose veins of right lower extremity with ulcer other part of lower leg: Secondary | ICD-10-CM | POA: Diagnosis not present

## 2021-02-27 DIAGNOSIS — I130 Hypertensive heart and chronic kidney disease with heart failure and stage 1 through stage 4 chronic kidney disease, or unspecified chronic kidney disease: Secondary | ICD-10-CM | POA: Diagnosis not present

## 2021-02-27 DIAGNOSIS — E1151 Type 2 diabetes mellitus with diabetic peripheral angiopathy without gangrene: Secondary | ICD-10-CM | POA: Diagnosis not present

## 2021-02-27 DIAGNOSIS — L97819 Non-pressure chronic ulcer of other part of right lower leg with unspecified severity: Secondary | ICD-10-CM | POA: Diagnosis not present

## 2021-02-27 DIAGNOSIS — I83028 Varicose veins of left lower extremity with ulcer other part of lower leg: Secondary | ICD-10-CM | POA: Diagnosis not present

## 2021-02-27 DIAGNOSIS — I83018 Varicose veins of right lower extremity with ulcer other part of lower leg: Secondary | ICD-10-CM | POA: Diagnosis not present

## 2021-02-27 DIAGNOSIS — L97829 Non-pressure chronic ulcer of other part of left lower leg with unspecified severity: Secondary | ICD-10-CM | POA: Diagnosis not present

## 2021-03-03 DIAGNOSIS — I130 Hypertensive heart and chronic kidney disease with heart failure and stage 1 through stage 4 chronic kidney disease, or unspecified chronic kidney disease: Secondary | ICD-10-CM | POA: Diagnosis not present

## 2021-03-03 DIAGNOSIS — I83018 Varicose veins of right lower extremity with ulcer other part of lower leg: Secondary | ICD-10-CM | POA: Diagnosis not present

## 2021-03-03 DIAGNOSIS — L97819 Non-pressure chronic ulcer of other part of right lower leg with unspecified severity: Secondary | ICD-10-CM | POA: Diagnosis not present

## 2021-03-03 DIAGNOSIS — E1151 Type 2 diabetes mellitus with diabetic peripheral angiopathy without gangrene: Secondary | ICD-10-CM | POA: Diagnosis not present

## 2021-03-03 DIAGNOSIS — L97829 Non-pressure chronic ulcer of other part of left lower leg with unspecified severity: Secondary | ICD-10-CM | POA: Diagnosis not present

## 2021-03-03 DIAGNOSIS — I83028 Varicose veins of left lower extremity with ulcer other part of lower leg: Secondary | ICD-10-CM | POA: Diagnosis not present

## 2021-03-03 NOTE — Progress Notes (Deleted)
[place] Phone: [phone] Subjective:    I'm seeing this patient by the request  of:  Leanna Battles, MD  CC:   [id number]  12/24/2020 Seems worse from previous exam but has been in the hospital and is now in the nursing facility.  Cellulitis is of the right leg but is very well.  Discussed with patient to continue to use the medication.  Patient though does have significant dementia at baseline at this moment and he does seem to [facility] unfortunately very somnolent secondary to the Depakote.  Patient given the injections today to help with some of the pain which hopefully will help with transferring.  Did discuss with patient's daughter to monitor for worsening of the rash, or any worsening of alertness level that they need to seek medical attention otherwise follow-up with me 10 weeks  Update 03/04/2021 Erin [name] is a 75 y.o. female coming in with complaint of B knee pain. Patient states      Past Medical History:  Diagnosis Date   Asthma    CHF (congestive heart failure) ([place])    hospital 11/2013-- [place]   Chronic kidney disease    Depression    Diabetes mellitus ([place])    GERD (gastroesophageal reflux disease)    Hyperlipidemia    Hypertension    Stroke [facility])    Past Surgical History:  Procedure Laterality Date   CHOLECYSTECTOMY  2002   LOOP RECORDER INSERTION N/A 08/01/2017   Procedure: LOOP RECORDER INSERTION;  Surgeon: Constance Haw, MD;  Location: [place] [facility];  Service: Cardiovascular;  Laterality: N/A;   SPINE SURGERY  aug 2015   [facility]   TEE WITHOUT CARDIOVERSION N/A 08/01/2017   Procedure: TRANSESOPHAGEAL ECHOCARDIOGRAM (TEE);  Surgeon: Sanda Klein, MD;  Location: [facility] ENDOSCOPY;  Service: Cardiovascular;  Laterality: N/A;   TONSILLECTOMY     TUBAL LIGATION     Social History   Socioeconomic History   Marital status: Widowed    Spouse name:  Not on file   Number of children: 4   Years of education: 33   Highest education level: 12th grade  Occupational History   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, production: [employer]  Tobacco Use   Smoking status: Former    Packs/day: 1.50    Years: 20.00    Pack years: 30.00    Types: Cigarettes    Quit date: 08/02/1984    Years since quitting: 36.6   Smokeless tobacco: Never  Vaping Use   Vaping Use: Never used  Substance and Sexual Activity   Alcohol use: No   Drug use: No   Sexual activity: Not Currently    Partners: Male  Other Topics Concern   Not on file  Social History Narrative   NO REG EXERCISE   Caffeine use: daily   Social Determinants of Health   Financial Resource Strain: Low Risk    Difficulty of Paying Living Expenses: Not hard at all  Food Insecurity: No Food Insecurity   Worried About Charity fundraiser in the Last Year: Never true   [place] in the Last Year: Never true  Transportation Needs: No Transportation Needs   Lack of Transportation (Medical): No   Lack of Transportation (Non-Medical): No  Physical Activity: Inactive   Days of Exercise per Week: 0 days   Minutes of Exercise per Session: 0 min  Stress: No Stress  Concern Present   Feeling of Stress : Only a little  Social Connections: Socially Isolated   Frequency of Communication with Friends and Family: More than three times a week   Frequency of Social Gatherings with Friends and Family: More than three times a week   Attends Religious Services: Never   Marine scientist or Organizations: No   Attends Archivist Meetings: Never   Marital Status: Widowed   Allergies  Allergen Reactions   Bee Venom    Family History  Problem Relation Age of Onset   Hypertension Mother    Alzheimer's disease Mother    Hyperlipidemia Mother    Heart disease Father        cad   Asthma Father    Cancer Father 41       leukemia   Stroke Father    Hypertension Father    Heart  disease Sister 56       MI   Pulmonary fibrosis Sister    Arthritis Brother    Heart disease Maternal Uncle    Stroke Maternal Uncle    Uterine cancer Paternal Grandmother    Stomach cancer Paternal Grandfather    Heart disease Maternal Uncle    Heart disease Maternal Uncle    Cancer Maternal Aunt        leukemia    Current Outpatient Medications (Endocrine & Metabolic):    Insulin Glargine (BASAGLAR KWIKPEN) 100 UNIT/ML, Inject 16 Units into the skin at bedtime.  Current Outpatient Medications (Cardiovascular):    atorvastatin (LIPITOR) 40 MG tablet, TAKE 1 TABLET (40 MG TOTAL) BY MOUTH DAILY. PT NEEDS OV FOR FURTHER REFILLS   carvedilol (COREG) 6.25 MG tablet, TAKE 1.5 TABLETS (9.375 MG TOTAL) BY MOUTH 2 (TWO) TIMES DAILY WITH A MEAL.   torsemide (DEMADEX) 20 MG tablet, TAKE FOUR TABLETS BY MOUTH TWICE DAILY  Current Outpatient Medications (Respiratory):    albuterol (PROVENTIL) (2.5 MG/3ML) 0.083% nebulizer solution, Take 3 mLs (2.5 mg total) by nebulization every 6 (six) hours as needed for wheezing or shortness of breath.   albuterol (VENTOLIN HFA) 108 (90 Base) MCG/ACT inhaler, Inhale 2 puffs into the lungs every 4 (four) hours as needed for wheezing or shortness of breath.   Current Outpatient Medications (Analgesics):    Acetaminophen (TYLENOL 8 HOUR PO), Take 500 mg by mouth. (Patient not taking: No sig reported)   Acetaminophen 500 MG capsule, Take 1 capsule by mouth every 8 (eight) hours as needed.   allopurinol (ZYLOPRIM) 300 MG tablet, Take 1 tablet (300 mg total) by mouth daily.   HYDROcodone-acetaminophen (NORCO/VICODIN) 5-325 MG tablet, Take 1 tablet by mouth every 4 (four) hours as needed for moderate pain.   traMADol (ULTRAM) 50 MG tablet, Take by mouth. And three times a day routinely for pain management  Current Outpatient Medications (Hematological):    vitamin B-12 (CYANOCOBALAMIN) 500 MCG tablet, Take 1 tablet by mouth daily.  Current Outpatient Medications  (Other):    AMBULATORY NON FORMULARY MEDICATION, Rollator   busPIRone (BUSPAR) 15 MG tablet, Take 1 tablet (15 mg total) by mouth 3 (three) times daily. Pt needs OV for further refills   Cholecalciferol 25 MCG (1000 UT) tablet, Take 1 tablet by mouth daily.   diclofenac Sodium (VOLTAREN) 1 % GEL, Apply 1 application topically 2 (two) times daily as needed.   divalproex (DEPAKOTE) 125 MG DR tablet, Take 2 tablets by mouth at bedtime.   doxycycline (VIBRA-TABS) 100 MG tablet, Take 1 tablet (100 mg  total) by mouth 2 (two) times daily.   gabapentin (NEURONTIN) 300 MG capsule, Take 1 capsule (300 mg total) by mouth at bedtime.   glucose blood test strip, Use 3x a day - One TOUch Ultra   Infant Care Products (DERMACLOUD) OINT, Apply 1 Tube topically 3 (three) times daily. Apply topically every shift from tube   melatonin 3 MG TABS tablet, Take 1 tablet by mouth at bedtime.   Multiple Vitamin (MULTI-VITAMIN) tablet, Take 1 tablet by mouth daily.   ondansetron (ZOFRAN) 4 MG tablet, Take 1 mg by mouth every 6 (six) hours as needed for nausea or vomiting.   pantoprazole (PROTONIX) 40 MG tablet, TAKE 1 TABLET BY MOUTH EVERY DAY   pramipexole (MIRAPEX) 1 MG tablet, TAKE 1 TABLET BY MOUTH AT BEDTIME.   Sennosides-Docusate Sodium 8.6-50 MG CAPS, Take 1 tablet by mouth daily.   sertraline (ZOLOFT) 50 MG tablet, TAKE 1 AND 1/2 TABLETS DAILY BY MOUTH   Reviewed prior external information including notes and imaging from  primary care provider As well as notes that were available from care everywhere and other healthcare systems.  Past medical history, social, surgical and family history all reviewed in electronic medical record.  No pertanent information unless stated regarding to the chief complaint.   Review of Systems:  No headache, visual changes, nausea, vomiting, diarrhea, constipation, dizziness, abdominal pain, skin rash, fevers, chills, night sweats, weight loss, swollen lymph nodes, body aches,  joint swelling, chest pain, shortness of breath, mood changes. POSITIVE muscle aches  Objective  There were no vitals taken for this visit.   General: No apparent distress alert and oriented x3 mood and affect normal, dressed appropriately.  HEENT: Pupils equal, extraocular movements intact  Respiratory: Patient's speak in full sentences and does not appear short of breath  Cardiovascular: No lower extremity edema, non tender, no erythema  Gait normal with good balance and coordination.  MSK:  Non tender with full range of motion and good stability and symmetric strength and tone of shoulders, elbows, wrist, hip, knee and ankles bilaterally.     Impression and Recommendations:     The above documentation has been reviewed and is accurate and complete Erin [name]

## 2021-03-04 ENCOUNTER — Ambulatory Visit: Payer: Medicare Other | Admitting: Family Medicine

## 2021-03-04 DIAGNOSIS — L97829 Non-pressure chronic ulcer of other part of left lower leg with unspecified severity: Secondary | ICD-10-CM | POA: Diagnosis not present

## 2021-03-04 DIAGNOSIS — L97819 Non-pressure chronic ulcer of other part of right lower leg with unspecified severity: Secondary | ICD-10-CM | POA: Diagnosis not present

## 2021-03-04 DIAGNOSIS — I83028 Varicose veins of left lower extremity with ulcer other part of lower leg: Secondary | ICD-10-CM | POA: Diagnosis not present

## 2021-03-04 DIAGNOSIS — I130 Hypertensive heart and chronic kidney disease with heart failure and stage 1 through stage 4 chronic kidney disease, or unspecified chronic kidney disease: Secondary | ICD-10-CM | POA: Diagnosis not present

## 2021-03-04 DIAGNOSIS — E1151 Type 2 diabetes mellitus with diabetic peripheral angiopathy without gangrene: Secondary | ICD-10-CM | POA: Diagnosis not present

## 2021-03-04 DIAGNOSIS — I83018 Varicose veins of right lower extremity with ulcer other part of lower leg: Secondary | ICD-10-CM | POA: Diagnosis not present

## 2021-03-05 DIAGNOSIS — I130 Hypertensive heart and chronic kidney disease with heart failure and stage 1 through stage 4 chronic kidney disease, or unspecified chronic kidney disease: Secondary | ICD-10-CM | POA: Diagnosis not present

## 2021-03-05 DIAGNOSIS — I83028 Varicose veins of left lower extremity with ulcer other part of lower leg: Secondary | ICD-10-CM | POA: Diagnosis not present

## 2021-03-05 DIAGNOSIS — L97819 Non-pressure chronic ulcer of other part of right lower leg with unspecified severity: Secondary | ICD-10-CM | POA: Diagnosis not present

## 2021-03-05 DIAGNOSIS — E1151 Type 2 diabetes mellitus with diabetic peripheral angiopathy without gangrene: Secondary | ICD-10-CM | POA: Diagnosis not present

## 2021-03-05 DIAGNOSIS — L97829 Non-pressure chronic ulcer of other part of left lower leg with unspecified severity: Secondary | ICD-10-CM | POA: Diagnosis not present

## 2021-03-05 DIAGNOSIS — I83018 Varicose veins of right lower extremity with ulcer other part of lower leg: Secondary | ICD-10-CM | POA: Diagnosis not present

## 2021-03-06 DIAGNOSIS — E1151 Type 2 diabetes mellitus with diabetic peripheral angiopathy without gangrene: Secondary | ICD-10-CM | POA: Diagnosis not present

## 2021-03-06 DIAGNOSIS — I83018 Varicose veins of right lower extremity with ulcer other part of lower leg: Secondary | ICD-10-CM | POA: Diagnosis not present

## 2021-03-06 DIAGNOSIS — L97819 Non-pressure chronic ulcer of other part of right lower leg with unspecified severity: Secondary | ICD-10-CM | POA: Diagnosis not present

## 2021-03-06 DIAGNOSIS — L97829 Non-pressure chronic ulcer of other part of left lower leg with unspecified severity: Secondary | ICD-10-CM | POA: Diagnosis not present

## 2021-03-06 DIAGNOSIS — I130 Hypertensive heart and chronic kidney disease with heart failure and stage 1 through stage 4 chronic kidney disease, or unspecified chronic kidney disease: Secondary | ICD-10-CM | POA: Diagnosis not present

## 2021-03-06 DIAGNOSIS — I83028 Varicose veins of left lower extremity with ulcer other part of lower leg: Secondary | ICD-10-CM | POA: Diagnosis not present

## 2021-03-10 DIAGNOSIS — I83018 Varicose veins of right lower extremity with ulcer other part of lower leg: Secondary | ICD-10-CM | POA: Diagnosis not present

## 2021-03-10 DIAGNOSIS — I130 Hypertensive heart and chronic kidney disease with heart failure and stage 1 through stage 4 chronic kidney disease, or unspecified chronic kidney disease: Secondary | ICD-10-CM | POA: Diagnosis not present

## 2021-03-10 DIAGNOSIS — L97819 Non-pressure chronic ulcer of other part of right lower leg with unspecified severity: Secondary | ICD-10-CM | POA: Diagnosis not present

## 2021-03-10 DIAGNOSIS — I83028 Varicose veins of left lower extremity with ulcer other part of lower leg: Secondary | ICD-10-CM | POA: Diagnosis not present

## 2021-03-10 DIAGNOSIS — E1151 Type 2 diabetes mellitus with diabetic peripheral angiopathy without gangrene: Secondary | ICD-10-CM | POA: Diagnosis not present

## 2021-03-10 DIAGNOSIS — L97829 Non-pressure chronic ulcer of other part of left lower leg with unspecified severity: Secondary | ICD-10-CM | POA: Diagnosis not present

## 2021-03-12 DIAGNOSIS — I83028 Varicose veins of left lower extremity with ulcer other part of lower leg: Secondary | ICD-10-CM | POA: Diagnosis not present

## 2021-03-12 DIAGNOSIS — I83018 Varicose veins of right lower extremity with ulcer other part of lower leg: Secondary | ICD-10-CM | POA: Diagnosis not present

## 2021-03-12 DIAGNOSIS — L97819 Non-pressure chronic ulcer of other part of right lower leg with unspecified severity: Secondary | ICD-10-CM | POA: Diagnosis not present

## 2021-03-12 DIAGNOSIS — I130 Hypertensive heart and chronic kidney disease with heart failure and stage 1 through stage 4 chronic kidney disease, or unspecified chronic kidney disease: Secondary | ICD-10-CM | POA: Diagnosis not present

## 2021-03-12 DIAGNOSIS — E1151 Type 2 diabetes mellitus with diabetic peripheral angiopathy without gangrene: Secondary | ICD-10-CM | POA: Diagnosis not present

## 2021-03-12 DIAGNOSIS — L97829 Non-pressure chronic ulcer of other part of left lower leg with unspecified severity: Secondary | ICD-10-CM | POA: Diagnosis not present

## 2021-03-16 DIAGNOSIS — I83018 Varicose veins of right lower extremity with ulcer other part of lower leg: Secondary | ICD-10-CM | POA: Diagnosis not present

## 2021-03-16 DIAGNOSIS — L97829 Non-pressure chronic ulcer of other part of left lower leg with unspecified severity: Secondary | ICD-10-CM | POA: Diagnosis not present

## 2021-03-16 DIAGNOSIS — E1151 Type 2 diabetes mellitus with diabetic peripheral angiopathy without gangrene: Secondary | ICD-10-CM | POA: Diagnosis not present

## 2021-03-16 DIAGNOSIS — I83028 Varicose veins of left lower extremity with ulcer other part of lower leg: Secondary | ICD-10-CM | POA: Diagnosis not present

## 2021-03-16 DIAGNOSIS — I130 Hypertensive heart and chronic kidney disease with heart failure and stage 1 through stage 4 chronic kidney disease, or unspecified chronic kidney disease: Secondary | ICD-10-CM | POA: Diagnosis not present

## 2021-03-16 DIAGNOSIS — L97819 Non-pressure chronic ulcer of other part of right lower leg with unspecified severity: Secondary | ICD-10-CM | POA: Diagnosis not present

## 2021-03-17 DIAGNOSIS — I130 Hypertensive heart and chronic kidney disease with heart failure and stage 1 through stage 4 chronic kidney disease, or unspecified chronic kidney disease: Secondary | ICD-10-CM | POA: Diagnosis not present

## 2021-03-17 DIAGNOSIS — L97829 Non-pressure chronic ulcer of other part of left lower leg with unspecified severity: Secondary | ICD-10-CM | POA: Diagnosis not present

## 2021-03-17 DIAGNOSIS — E1151 Type 2 diabetes mellitus with diabetic peripheral angiopathy without gangrene: Secondary | ICD-10-CM | POA: Diagnosis not present

## 2021-03-17 DIAGNOSIS — L97819 Non-pressure chronic ulcer of other part of right lower leg with unspecified severity: Secondary | ICD-10-CM | POA: Diagnosis not present

## 2021-03-17 DIAGNOSIS — I83018 Varicose veins of right lower extremity with ulcer other part of lower leg: Secondary | ICD-10-CM | POA: Diagnosis not present

## 2021-03-17 DIAGNOSIS — I83028 Varicose veins of left lower extremity with ulcer other part of lower leg: Secondary | ICD-10-CM | POA: Diagnosis not present

## 2021-03-18 NOTE — Progress Notes (Signed)
Erin [name] Phone: [phone] Subjective:   Erin [name], am serving as a scribe for Dr. Hulan Saas. This visit occurred during the SARS-CoV-2 public health emergency.  Safety protocols were in place, including screening questions prior to the visit, additional usage of staff PPE, and extensive cleaning of exam room while observing appropriate contact time as indicated for disinfecting solutions.   I'm seeing this patient by the request  of:  Leanna Battles, MD  CC: Bilateral knee pain  [id number]  12/24/2020 Seems worse from previous exam but has been in the hospital and is now in the nursing facility.  Repeat injections given December 24, 2020 patient having worsening symptoms again.  Patient does seem to be having more failure to thrive and is in a nursing home.  Patient did was significantly somnolent today secondary to recent dosing of Depakote.  Discussed with family members if worsening symptoms to seek medical attention.  Patient can have injections in the knees every 10 weeks if necessary to help with transfers and decrease pain.  Cellulitis is of the right leg but is very well.  Discussed with patient to continue to use the medication.  Patient though does have significant dementia at baseline at this moment and he does seem to [facility] unfortunately very somnolent secondary to the Depakote.  Patient given the injections today to help with some of the pain which hopefully will help with transferring.  Did discuss with patient's daughter to monitor for worsening of the rash, or any worsening of alertness level that they need to seek medical attention otherwise follow-up with me 10 weeks  Update 03/19/2021 Erin [name] is a 75 y.o. female coming in with complaint of L leg pain. History of B knee pain. Patient states that her pain is in L knee that radiates down into the lower leg. Also having lower back  and R hip pain that seems to be worse with the weather.  Patient does have dementia.  Accompanied with daughter.  He states that she has been complaining a little bit more recently.  Continues on the legs at the moment.       Past Medical History:  Diagnosis Date   Asthma    CHF (congestive heart failure) ([place])    hospital 11/2013-- [place]   Chronic kidney disease    Depression    Diabetes mellitus ([place])    GERD (gastroesophageal reflux disease)    Hyperlipidemia    Hypertension    Stroke [facility])    Past Surgical History:  Procedure Laterality Date   CHOLECYSTECTOMY  2002   LOOP RECORDER INSERTION N/A 08/01/2017   Procedure: LOOP RECORDER INSERTION;  Surgeon: Constance Haw, MD;  Location: [place] [facility];  Service: Cardiovascular;  Laterality: N/A;   SPINE SURGERY  aug 2015   [facility]   TEE WITHOUT CARDIOVERSION N/A 08/01/2017   Procedure: TRANSESOPHAGEAL ECHOCARDIOGRAM (TEE);  Surgeon: Sanda Klein, MD;  Location: [facility] ENDOSCOPY;  Service: Cardiovascular;  Laterality: N/A;   TONSILLECTOMY     TUBAL LIGATION     Social History   Socioeconomic History   Marital status: Widowed    Spouse name: Not on file   Number of children: 4   Years of education: 71   Highest education level: 12th grade  Occupational History   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, production: [employer]  Tobacco Use   Smoking status: Former  Packs/day: 1.50    Years: 20.00    Pack years: 30.00    Types: Cigarettes    Quit date: 08/02/1984    Years since quitting: 36.6   Smokeless tobacco: Never  Vaping Use   Vaping Use: Never used  Substance and Sexual Activity   Alcohol use: [name]   Drug use: [name]   Sexual activity: Not Currently    Partners: Male  Other Topics Concern   Not on file  Social History Narrative   [name] REG EXERCISE   Caffeine use: daily   Social Determinants of Health   Financial Resource Strain: Low Risk    Difficulty of Paying Living Expenses: Not hard  at all  Food Insecurity: [name] Food Insecurity   Worried About Charity fundraiser in the Last Year: Never true   Ran Out of Food in the Last Year: Never true  Transportation Needs: [name] Transportation Needs   Lack of Transportation (Medical): [name]   Lack of Transportation (Non-Medical): [name]  Physical Activity: Inactive   Days of Exercise per Week: 0 days   Minutes of Exercise per Session: 0 min  Stress: [name] Stress Concern Present   Feeling of Stress : Only a little  Social Connections: Socially Isolated   Frequency of Communication with Friends and Family: More than three times a week   Frequency of Social Gatherings with Friends and Family: More than three times a week   Attends Religious Services: Never   Marine scientist or Organizations: [name]   Attends Archivist Meetings: Never   Marital Status: Widowed   Allergies  Allergen Reactions   Bee Venom    Family History  Problem Relation Age of Onset   Hypertension Mother    Alzheimer's disease Mother    Hyperlipidemia Mother    Heart disease Father        cad   Asthma Father    Cancer Father 88       leukemia   Stroke Father    Hypertension Father    Heart disease Sister 54       MI   Pulmonary fibrosis Sister    Arthritis Brother    Heart disease Maternal Uncle    Stroke Maternal Uncle    Uterine cancer Paternal Grandmother    Stomach cancer Paternal Grandfather    Heart disease Maternal Uncle    Heart disease Maternal Uncle    Cancer Maternal Aunt        leukemia    Current Outpatient Medications (Endocrine & Metabolic):    Insulin Glargine (BASAGLAR KWIKPEN) 100 UNIT/ML, Inject 16 Units into the skin at bedtime.  Current Outpatient Medications (Cardiovascular):    atorvastatin (LIPITOR) 40 MG tablet, TAKE 1 TABLET (40 MG TOTAL) BY MOUTH DAILY. PT NEEDS OV FOR FURTHER REFILLS   carvedilol (COREG) 6.25 MG tablet, TAKE 1.5 TABLETS (9.375 MG TOTAL) BY MOUTH 2 (TWO) TIMES DAILY WITH A MEAL.   torsemide  (DEMADEX) 20 MG tablet, TAKE FOUR TABLETS BY MOUTH TWICE DAILY  Current Outpatient Medications (Respiratory):    albuterol (PROVENTIL) (2.5 MG/3ML) 0.083% nebulizer solution, Take 3 mLs (2.5 mg total) by nebulization every 6 (six) hours as needed for wheezing or shortness of breath.   albuterol (VENTOLIN HFA) 108 (90 Base) MCG/ACT inhaler, Inhale 2 puffs into the lungs every 4 (four) hours as needed for wheezing or shortness of breath.   Current Outpatient Medications (Analgesics):    Acetaminophen (TYLENOL 8 HOUR PO), Take 500 mg by  mouth.   Acetaminophen 500 MG capsule, Take 1 capsule by mouth every 8 (eight) hours as needed.   allopurinol (ZYLOPRIM) 300 MG tablet, Take 1 tablet (300 mg total) by mouth daily.   HYDROcodone-acetaminophen (NORCO/VICODIN) 5-325 MG tablet, Take 1 tablet by mouth every 4 (four) hours as needed for moderate pain.   traMADol (ULTRAM) 50 MG tablet, Take by mouth. And three times a day routinely for pain management  Current Outpatient Medications (Hematological):    vitamin B-12 (CYANOCOBALAMIN) 500 MCG tablet, Take 1 tablet by mouth daily.  Current Outpatient Medications (Other):    AMBULATORY NON FORMULARY MEDICATION, Rollator   busPIRone (BUSPAR) 15 MG tablet, Take 1 tablet (15 mg total) by mouth 3 (three) times daily. Pt needs OV for further refills   Cholecalciferol 25 MCG (1000 UT) tablet, Take 1 tablet by mouth daily.   diclofenac Sodium (VOLTAREN) 1 % GEL, Apply 1 application topically 2 (two) times daily as needed.   divalproex (DEPAKOTE) 125 MG DR tablet, Take 2 tablets by mouth at bedtime.   doxycycline (VIBRA-TABS) 100 MG tablet, Take 1 tablet (100 mg total) by mouth 2 (two) times daily.   gabapentin (NEURONTIN) 300 MG capsule, Take 1 capsule (300 mg total) by mouth at bedtime.   glucose blood test strip, Use 3x a day - One TOUch Ultra   Infant Care Products (DERMACLOUD) OINT, Apply 1 Tube topically 3 (three) times daily. Apply topically every shift  from tube   Multiple Vitamin (MULTI-VITAMIN) tablet, Take 1 tablet by mouth daily.   ondansetron (ZOFRAN) 4 MG tablet, Take 1 mg by mouth every 6 (six) hours as needed for nausea or vomiting.   pantoprazole (PROTONIX) 40 MG tablet, TAKE 1 TABLET BY MOUTH EVERY DAY   pramipexole (MIRAPEX) 1 MG tablet, TAKE 1 TABLET BY MOUTH AT BEDTIME.   Sennosides-Docusate Sodium 8.6-50 MG CAPS, Take 1 tablet by mouth daily.   sertraline (ZOLOFT) 50 MG tablet, TAKE 1 AND 1/2 TABLETS DAILY BY MOUTH     Objective  Blood pressure (!) 142/56, pulse 82, height 5\' 2"  (1.575 m), SpO2 91 %.   General: [name] apparent distress more alert than previous exam patient is not oriented HEENT: Pupils equal, extraocular movements intact  Respiratory: Patient's speak in full sentences and does not appear short of breath  Cardiovascular: 2+ pitting edema of the lower extremities and patient is wrapped in [place] boots bilaterally neurovascularly intact though.  [name] significant cellulitis or redness of the knees noted. Patient is nonambulatory in a wheelchair MSK: Knee exam does still have the instability with valgus and varus force.  Limited range of motion with some of it secondary to voluntary guarding.  After informed written and verbal consent, patient was seated on exam table. Right knee was prepped with alcohol swab and utilizing anterolateral approach, patient's right knee space was injected with 4:1  marcaine 0.5%: Kenalog 40mg /dL. Patient tolerated the procedure well without immediate complications.  After informed written and verbal consent, patient was seated on exam table. Left knee was prepped with alcohol swab and utilizing anterolateral approach, patient's left knee space was injected with 4:1  marcaine 0.5%: Kenalog 40mg /dL. Patient tolerated the procedure well without immediate complications.    Impression and Recommendations:     The above documentation has been reviewed and is accurate and complete Lyndal Pulley, DO

## 2021-03-19 ENCOUNTER — Encounter: Payer: Self-pay | Admitting: Family Medicine

## 2021-03-19 ENCOUNTER — Other Ambulatory Visit: Payer: Self-pay

## 2021-03-19 ENCOUNTER — Ambulatory Visit (INDEPENDENT_AMBULATORY_CARE_PROVIDER_SITE_OTHER): Payer: Medicare Other | Admitting: Family Medicine

## 2021-03-19 DIAGNOSIS — M170 Bilateral primary osteoarthritis of knee: Secondary | ICD-10-CM

## 2021-03-19 DIAGNOSIS — I639 Cerebral infarction, unspecified: Secondary | ICD-10-CM

## 2021-03-19 NOTE — Patient Instructions (Signed)
See me again in 8-10 weeks Happy Holidays

## 2021-03-19 NOTE — Assessment & Plan Note (Signed)
Repeat injection given again today.  Patient continues to have the lower extremity swelling.  Patient is in [place] boots at this moment.  Discussed with patient about home exercises and icing regimen.  Patient does have significant dementia noted.  Is accompanied with daughter who we discussed a plan to follow-up again in 8 to 10 weeks if this continues to give her some relief of pain for some time.

## 2021-03-20 DIAGNOSIS — I130 Hypertensive heart and chronic kidney disease with heart failure and stage 1 through stage 4 chronic kidney disease, or unspecified chronic kidney disease: Secondary | ICD-10-CM | POA: Diagnosis not present

## 2021-03-20 DIAGNOSIS — I83018 Varicose veins of right lower extremity with ulcer other part of lower leg: Secondary | ICD-10-CM | POA: Diagnosis not present

## 2021-03-20 DIAGNOSIS — L97829 Non-pressure chronic ulcer of other part of left lower leg with unspecified severity: Secondary | ICD-10-CM | POA: Diagnosis not present

## 2021-03-20 DIAGNOSIS — I83028 Varicose veins of left lower extremity with ulcer other part of lower leg: Secondary | ICD-10-CM | POA: Diagnosis not present

## 2021-03-20 DIAGNOSIS — L97819 Non-pressure chronic ulcer of other part of right lower leg with unspecified severity: Secondary | ICD-10-CM | POA: Diagnosis not present

## 2021-03-20 DIAGNOSIS — E1151 Type 2 diabetes mellitus with diabetic peripheral angiopathy without gangrene: Secondary | ICD-10-CM | POA: Diagnosis not present

## 2021-03-26 DIAGNOSIS — E7849 Other hyperlipidemia: Secondary | ICD-10-CM | POA: Diagnosis not present

## 2021-03-26 DIAGNOSIS — E038 Other specified hypothyroidism: Secondary | ICD-10-CM | POA: Diagnosis not present

## 2021-03-26 DIAGNOSIS — E559 Vitamin D deficiency, unspecified: Secondary | ICD-10-CM | POA: Diagnosis not present

## 2021-03-26 DIAGNOSIS — D518 Other vitamin B12 deficiency anemias: Secondary | ICD-10-CM | POA: Diagnosis not present

## 2021-03-26 DIAGNOSIS — Z79899 Other long term (current) drug therapy: Secondary | ICD-10-CM | POA: Diagnosis not present

## 2021-03-26 DIAGNOSIS — E119 Type 2 diabetes mellitus without complications: Secondary | ICD-10-CM | POA: Diagnosis not present

## 2021-03-27 ENCOUNTER — Encounter: Payer: Self-pay | Admitting: Oncology

## 2021-03-30 DIAGNOSIS — N390 Urinary tract infection, site not specified: Secondary | ICD-10-CM | POA: Diagnosis not present

## 2021-04-02 ENCOUNTER — Ambulatory Visit: Payer: Medicare Other | Admitting: Family Medicine

## 2021-04-10 ENCOUNTER — Telehealth: Payer: Self-pay | Admitting: Oncology

## 2021-04-10 NOTE — Telephone Encounter (Signed)
Received Updated Medical Records from [facility] (facility) regarding patient.  Per Dr Hinton Rao, She already has Appt on 1/23 w/labs, Follow Up.  These are not much different.  She has Myelofibrosis.  Notified Rolena Infante

## 2021-04-21 ENCOUNTER — Telehealth: Payer: Self-pay

## 2021-04-21 NOTE — Progress Notes (Signed)
Erin [name]  [address] [place],    [address] [phone]  Clinic Day:  04/27/2021  Referring physician: Carollee Herter, Alferd Apa, *   This document serves as a record of services personally performed by Hosie Poisson, MD. It was created on their behalf by Curry,Lauren E, a trained medical scribe. The creation of this record is based on the scribe's personal observations and the provider's statements to them.  CHIEF COMPLAINT:  CC:  Myelofibrosis  Current Treatment:  Surveillance   HISTORY OF PRESENT ILLNESS:  Erin [name] is a 76 y.o. female who I saw many years ago for thrombocytosis and she was referred back to me in July of 2018 for thrombocytopenia.  She has also been found to have anemia and both her hemoglobin and platelet count have been decreasing.  Her hemoglobin was down to 9.8 in July 2018 with a platelet count of 74,000. Her white count was elevated at 13,000 with 81% neutrophils, 8% lymphocytes, 9% monocytes, 1% eosinophils, and 1% basophils.  However, when I rechecked these counts on July 26th, I found more of a left shift and her white count was up to 16.6.  Her hemoglobin was a little better at 11.2 with an MCV of 84 and platelet count of 83,000. This time her differential revealed 61% neutrophils, 16% bands, 6% lymphocytes, 3% monocytes, 1% basophils, 5% metamyelocytes, 6% myelocytes and 2% blasts.  A reticulocyte count was 5.4%, sed rate was 93 and LDH was markedly elevated at 1939.  No monoclonal spike was seen.  I reviewed her peripheral blood smear myself and did confirm a rare blast and promyelocyte.  She was understandably anxious since her father had some form of leukemia at age 36 and lived 5 years.  She thinks this may have been chronic myelogenous leukemia.  An aunt also had leukemia.  She does note dyspnea with exertion.  She does see a nephrologist in [facility] with [facility] for chronic kidney disease, Toye  Lufadeju, MD.  She also has venous stasis and has wraps on her legs.  She complains of pain of her lower back and legs at a 6/10.  When I saw her in August 2018, her white count was still elevated at 14.5 with 45% neutrophils, 19% bands, 9% lymphocytes, 1% monocytes, 2% eosinophils, 2% basophils, 8% metamyelocytes, 14% myelocytes and 1 nucleated red blood cell per 100 WBC's.  Her hemoglobin was stable at 11.3 but her platelet count was mildly lower at 74,000 and a smear revealed occasional teardrops.  JAK 2 mutation was negative.  We therefore performed a bone marrow on August 22nd and this was consistent with myelofibrosis.  The cytogenetics revealed normal 10 XX chromosomes and PCR for bcr/ABL was negative.  The sample was very limited as the aspirate was mainly blood.  There was a predominance of granulocytes, red cells and occasional atypical megakaryocytes associated with reticulin fibrosis, for a diagnosis of myelofibrosis.  Since that time she has had a stroke, and she has worsening vascular dementia.    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 This will cause her blood counts to be chronically low, and does not require treatment at this time. I would only transfuse her if her hemoglobin drops to 7.5 or less.   INTERVAL HISTORY:  Erin [name] is here for routine follow up and states that she is fair. She reports pain with ambulation and back pain so remains in a wheelchair. She also has severe  bilateral lower extremity edema with wraps in place. She has had some hair thinning and so we will order iron studies and TSH for further evaluation. She did receive IV iron in January of 2022 with improvement, after she did not respond to oral iron. Lab evaluation from January 19th revealed a white count of 4.0 with an ANC of 3200, a hemoglobin of 8.7 and a platelet count of 95,000. Today, her white count is 4.0 with an ANC of 3280, hemoglobin is 8.3 and platelets are 87,000.  Chemistries are unremarkable except for a BUN of 83 and a creatinine of 1.5, stable. Her  appetite is good, and she is eating well.  She denies fever, chills or other signs of infection.  She denies nausea, vomiting, bowel issues, or abdominal pain.  She denies sore throat, cough, dyspnea, or chest pain.  REVIEW OF SYSTEMS:  Review of Systems  Constitutional:  Positive for fatigue. Negative for appetite change, chills, fever and unexpected weight change.       Hair thinning  HENT:  Negative.    Eyes: Negative.   Respiratory: Negative.  Negative for chest tightness, cough, hemoptysis, shortness of breath and wheezing.   Cardiovascular:  Positive for leg swelling (bilateral, severe). Negative for chest pain and palpitations.  Gastrointestinal: Negative.  Negative for abdominal distention, abdominal pain, blood in stool, constipation, diarrhea, nausea and vomiting.  Endocrine: Negative.   Genitourinary: Negative.  Negative for difficulty urinating, dysuria, frequency and hematuria.   Musculoskeletal:  Positive for arthralgias, back pain and gait problem (in a wheelchair). Negative for flank pain and myalgias.  Skin: Negative.   Neurological:  Positive for gait problem (in a wheelchair). Negative for dizziness, extremity weakness, headaches, light-headedness, numbness, seizures and speech difficulty.  Hematological: Negative.   Psychiatric/Behavioral: Negative.  Negative for depression and sleep disturbance. The patient is not nervous/anxious.     VITALS:  Blood pressure 140/63, pulse 71, temperature (!) 97.5 F (36.4 C), temperature source Oral, resp. rate 20, SpO2 94 %.  Wt Readings from Last 3 Encounters:  10/07/20 220 lb (99.8 kg)  08/26/20 215 lb 3.2 oz (97.6 kg)  08/15/20 224 lb (101.6 kg)    There is no height or weight on file to calculate BMI.  Performance status (ECOG): 2 - Symptomatic, <50% confined to bed  PHYSICAL EXAM:  Physical Exam Constitutional:      General: She is  not in acute distress.    Appearance: Normal appearance. She is normal weight.  HENT:     Head: Normocephalic and atraumatic.     Comments: Hair thinning Eyes:     General: No scleral icterus.    Extraocular Movements: Extraocular movements intact.     Conjunctiva/sclera: Conjunctivae normal.     Pupils: Pupils are equal, round, and reactive to light.  Cardiovascular:     Rate and Rhythm: Normal rate and regular rhythm.     Pulses: Normal pulses.     Heart sounds: Normal heart sounds. No murmur heard.   No friction rub. No gallop.  Pulmonary:     Effort: Pulmonary effort is normal. No respiratory distress.     Breath sounds: Normal breath sounds.  Abdominal:     General: Bowel sounds are normal. There is no distension.     Palpations: Abdomen is soft. There is no hepatomegaly, splenomegaly or mass.     Tenderness: There is no abdominal tenderness.  Musculoskeletal:        General: Normal range of motion.  Cervical back: Normal range of motion and neck supple.     Right lower leg: Edema (2-3+, with severe venous changes, and ace bandages in place) present.     Left lower leg: Edema (2-3+, with severe venous changes, and ace bandages in place) present.  Lymphadenopathy:     Cervical: No cervical adenopathy.  Skin:    General: Skin is warm and dry.  Neurological:     General: No focal deficit present.     Mental Status: She is alert and oriented to person, place, and time. Mental status is at baseline.  Psychiatric:        Mood and Affect: Mood normal.        Behavior: Behavior normal.        Thought Content: Thought content normal.        Judgment: Judgment normal.    LABS:   CBC Latest Ref Rng & Units 04/27/2021 11/27/2020 08/26/2020  WBC - 4.0 9.6 8.4  Hemoglobin 12.0 - 16.0 8.3(A) 9.5(L) 11.4(L)  Hematocrit 36 - 46 25(A) 28.5(L) 34.5(L)  Platelets 150 - 399 87(A) 75.0(L) 92.0(L)   CMP Latest Ref Rng & Units 11/27/2020 08/26/2020 04/03/2020  Glucose 70 - 99 mg/dL 91  115(H) -  BUN 6 - 23 mg/dL 76(H) 51(H) 66(A)  Creatinine 0.40 - 1.20 mg/dL 1.70(H) 1.38(H) 1.5(A)  Sodium 135 - 145 mEq/L 135 140 139  Potassium 3.5 - 5.1 mEq/L 3.9 4.6 4.1  Chloride 96 - 112 mEq/L 99 105 105  CO2 19 - 32 mEq/L 22 25 24(A)  Calcium 8.4 - 10.5 mg/dL 8.9 9.1 9.0  Total Protein 6.0 - 8.3 g/dL 6.5 6.5 -  Total Bilirubin 0.2 - 1.2 mg/dL 1.2 0.7 -  Alkaline Phos 39 - 117 U/L 127(H) 110 54  AST 0 - 37 U/L 74(H) 21 36(A)  ALT 0 - 35 U/L 84(H) 17 25     No results found for: TOTALPROTELP, ALBUMINELP, A1GS, A2GS, BETS, BETA2SER, GAMS, MSPIKE, SPEI Lab Results  Component Value Date   TIBC 389 04/03/2020   TIBC 362 07/27/2012   FERRITIN 273.3 08/26/2020   FERRITIN 92.4 04/03/2020   FERRITIN 106.7 07/26/2019   IRONPCTSAT 14.8 (L) 08/26/2020   IRONPCTSAT 15.1 04/03/2020   IRONPCTSAT 11.6 (L) 03/05/2020   Lab Results  Component Value Date   LDH 1,016 (A) 04/03/2020    STUDIES:  No results found.     HISTORY:   Allergies:  Allergies  Allergen Reactions   Bee Venom     Current Medications: Current Outpatient Medications  Medication Sig Dispense Refill   Acetaminophen (TYLENOL 8 HOUR PO) Take 500 mg by mouth.     Acetaminophen 500 MG capsule Take 1 capsule by mouth every 8 (eight) hours as needed.     albuterol (PROVENTIL) (2.5 MG/3ML) 0.083% nebulizer solution Take 3 mLs (2.5 mg total) by nebulization every 6 (six) hours as needed for wheezing or shortness of breath. 75 mL 12   albuterol (VENTOLIN HFA) 108 (90 Base) MCG/ACT inhaler Inhale 2 puffs into the lungs every 4 (four) hours as needed for wheezing or shortness of breath.      allopurinol (ZYLOPRIM) 300 MG tablet Take 1 tablet (300 mg total) by mouth daily. 90 tablet 3   atorvastatin (LIPITOR) 40 MG tablet TAKE 1 TABLET (40 MG TOTAL) BY MOUTH DAILY. PT NEEDS OV FOR FURTHER REFILLS 90 tablet 1   busPIRone (BUSPAR) 15 MG tablet Take 1 tablet (15 mg total) by mouth 3 (three) times daily.  Pt needs OV for  further refills 270 tablet 0   carvedilol (COREG) 6.25 MG tablet TAKE 1.5 TABLETS (9.375 MG TOTAL) BY MOUTH 2 (TWO) TIMES DAILY WITH A MEAL. 170 tablet 1   Cholecalciferol 25 MCG (1000 UT) tablet Take 1 tablet by mouth daily.     diclofenac Sodium (VOLTAREN) 1 % GEL Apply 1 application topically 2 (two) times daily as needed. 2 grams to knees twice a day for pain     diclofenac Sodium (VOLTAREN) 1 % GEL Apply 2 g topically 2 (two) times daily as needed. To affected shoulder twice a day as needed for pain     gabapentin (NEURONTIN) 300 MG capsule Take 1 capsule (300 mg total) by mouth at bedtime. (Patient taking differently: Take 300 mg by mouth 2 (two) times daily.) 90 capsule 3   HYDROcodone-acetaminophen (NORCO/VICODIN) 5-325 MG tablet Take 1 tablet by mouth daily at 12 noon. Takes daily at 12pm     Insulin Glargine (BASAGLAR KWIKPEN) 100 UNIT/ML Inject 100 Units into the skin at bedtime. Per [facility] med list     melatonin 3 MG TABS tablet Take 3 mg by mouth at bedtime.     Multiple Vitamin (MULTI-VITAMIN) tablet Take 1 tablet by mouth daily.     ondansetron (ZOFRAN) 4 MG tablet Take 4 mg by mouth every 6 (six) hours as needed for nausea or vomiting.     pantoprazole (PROTONIX) 40 MG tablet TAKE 1 TABLET BY MOUTH EVERY DAY 90 tablet 1   pramipexole (MIRAPEX) 1 MG tablet TAKE 1 TABLET BY MOUTH AT BEDTIME. 90 tablet 1   Sennosides-Docusate Sodium 8.6-50 MG CAPS Take 1 tablet by mouth daily.     sertraline (ZOLOFT) 50 MG tablet TAKE 1 AND 1/2 TABLETS DAILY BY MOUTH 135 tablet 1   torsemide (DEMADEX) 20 MG tablet 1 tablet at 2pm, 2 tablets in the morning per med list at [facility]     vitamin B-12 (CYANOCOBALAMIN) 500 MCG tablet Take 1 tablet by mouth daily.     No current facility-administered medications for this visit.     ASSESSMENT & PLAN:   Assessment:   1.  Myelofibrosis, diagnosed in August 2018. This likely will not threaten her life, and so only routine surveillance is  recommended.  She has pancytopenia, but her counts will chronically be low and there is no specific therapy for her. I do expect that her blood counts will slowly worsen over time and there is a slight possibility of evolution into leukemia.   2.  Iron deficiency in the past, which did not respond to oral iron supplement and is aggravated by her myelofibrosis.  She received 2 doses of IV iron in January 2022 with improvement. Iron studies from October did not reveal recurrent deficiency. We have repeated iron studies today.  3. Pneumonia and cellulitis in September 2022, treated with doxycycline.  4. Chronic kidney disease, stable.  5. Severe lower extremity edema. She is on torsemide 20 mg TID per her med list, however, this remains severe.  6. Vascular dementia. She seems to be doing fairly well today with her mental status and speech.  7. Alopecia, and so we will check TSH and iron studies.  Plan: At this time, we will continue surveillance to monitor her blood counts.  I do feel she may have some degree of iron deficiency, which is likely aggravated by her myelofibrosis.  There is no evidence of hemolysis and we have ruled out other deficiency in the  past. I will see her back in 4 months with repeat CBC and comprehensive metabolic profile.  She understands and agrees with this plan of care.  I provided 20 minutes of face-to-face time during this this encounter and > 50% was spent counseling as documented under my assessment and plan.    Derwood Kaplan, MD [facility] AT [facility] [address] [place] [address] Dept: [phone] Dept Fax: [phone]   I, Rita Ohara, am acting as scribe for Derwood Kaplan, MD  I have reviewed this report as typed by the medical scribe, and it is complete and accurate.

## 2021-04-21 NOTE — Telephone Encounter (Signed)
Spoke with patient's daughter (ok per DPR) patient is in assisted living facility informed her that [facility] Condon was at [facility], return kit ordered and sent to daughters address patient DC in [place].

## 2021-04-24 ENCOUNTER — Other Ambulatory Visit: Payer: Self-pay | Admitting: Oncology

## 2021-04-24 DIAGNOSIS — D509 Iron deficiency anemia, unspecified: Secondary | ICD-10-CM

## 2021-04-27 ENCOUNTER — Telehealth: Payer: Self-pay | Admitting: Oncology

## 2021-04-27 ENCOUNTER — Other Ambulatory Visit: Payer: Self-pay | Admitting: Hematology and Oncology

## 2021-04-27 ENCOUNTER — Other Ambulatory Visit: Payer: Self-pay | Admitting: Oncology

## 2021-04-27 ENCOUNTER — Inpatient Hospital Stay: Payer: Medicare Other | Attending: Oncology | Admitting: Oncology

## 2021-04-27 ENCOUNTER — Encounter: Payer: Self-pay | Admitting: Oncology

## 2021-04-27 ENCOUNTER — Inpatient Hospital Stay: Payer: Medicare Other

## 2021-04-27 ENCOUNTER — Other Ambulatory Visit: Payer: Self-pay

## 2021-04-27 VITALS — BP 140/63 | HR 71 | Temp 97.5°F | Resp 20

## 2021-04-27 DIAGNOSIS — R5383 Other fatigue: Secondary | ICD-10-CM | POA: Diagnosis not present

## 2021-04-27 DIAGNOSIS — D473 Essential (hemorrhagic) thrombocythemia: Secondary | ICD-10-CM

## 2021-04-27 DIAGNOSIS — N189 Chronic kidney disease, unspecified: Secondary | ICD-10-CM | POA: Diagnosis not present

## 2021-04-27 DIAGNOSIS — D474 Osteomyelofibrosis: Secondary | ICD-10-CM

## 2021-04-27 DIAGNOSIS — D509 Iron deficiency anemia, unspecified: Secondary | ICD-10-CM

## 2021-04-27 DIAGNOSIS — D7581 Myelofibrosis: Secondary | ICD-10-CM | POA: Insufficient documentation

## 2021-04-27 DIAGNOSIS — E611 Iron deficiency: Secondary | ICD-10-CM | POA: Insufficient documentation

## 2021-04-27 DIAGNOSIS — R269 Unspecified abnormalities of gait and mobility: Secondary | ICD-10-CM | POA: Insufficient documentation

## 2021-04-27 DIAGNOSIS — Z79899 Other long term (current) drug therapy: Secondary | ICD-10-CM | POA: Insufficient documentation

## 2021-04-27 DIAGNOSIS — L659 Nonscarring hair loss, unspecified: Secondary | ICD-10-CM

## 2021-04-27 LAB — IRON AND TIBC
Iron: 54 ug/dL (ref 28–170)
Iron: 61 ug/dL (ref 28–170)
Saturation Ratios: 19 % (ref 10.4–31.8)
Saturation Ratios: 20 % (ref 10.4–31.8)
TIBC: 290 ug/dL (ref 250–450)
TIBC: 299 ug/dL (ref 250–450)
UIBC: 236 ug/dL
UIBC: 238 ug/dL

## 2021-04-27 LAB — CBC AND DIFFERENTIAL
HCT: 25 — AB (ref 36–46)
Hemoglobin: 8.3 — AB (ref 12.0–16.0)
Neutrophils Absolute: 3.28
Platelets: 87 — AB (ref 150–399)
WBC: 4

## 2021-04-27 LAB — CBC
Absolute Lymphocytes: 0.4 — AB (ref 0.65–4.75)
MCV: 89 (ref 81–99)
RBC: 2.84 — AB (ref 3.87–5.11)

## 2021-04-27 LAB — FERRITIN
Ferritin: 98 ng/mL (ref 11–307)
Ferritin: 108 ng/mL (ref 11–307)

## 2021-04-27 LAB — HEPATIC FUNCTION PANEL
ALT: 22 (ref 7–35)
AST: 34 (ref 13–35)
Alkaline Phosphatase: 122 (ref 25–125)
Bilirubin, Total: 0.6

## 2021-04-27 LAB — BASIC METABOLIC PANEL
BUN: 83 — AB (ref 4–21)
CO2: 19 (ref 13–22)
Chloride: 106 (ref 99–108)
Creatinine: 1.5 — AB (ref 0.5–1.1)
Glucose: 145
Potassium: 3.7 (ref 3.4–5.3)
Sodium: 136 — AB (ref 137–147)

## 2021-04-27 LAB — TSH: TSH: 3.396 u[IU]/mL (ref 0.350–4.500)

## 2021-04-27 LAB — COMPREHENSIVE METABOLIC PANEL
Albumin: 3.6 (ref 3.5–5.0)
Calcium: 8.8 (ref 8.7–10.7)

## 2021-04-27 NOTE — Telephone Encounter (Signed)
Per 1/23 los next appt scheduled and confirmed with daughter

## 2021-04-28 DIAGNOSIS — I361 Nonrheumatic tricuspid (valve) insufficiency: Secondary | ICD-10-CM

## 2021-04-28 DIAGNOSIS — I350 Nonrheumatic aortic (valve) stenosis: Secondary | ICD-10-CM

## 2021-04-28 LAB — SOLUBLE TRANSFERRIN RECEPTOR: Transferrin Receptor: 32 nmol/L — ABNORMAL HIGH (ref 12.2–27.3)

## 2021-05-07 ENCOUNTER — Encounter: Payer: Self-pay | Admitting: Oncology

## 2021-05-08 ENCOUNTER — Encounter: Payer: Self-pay | Admitting: Oncology

## 2021-05-08 NOTE — Addendum Note (Signed)
Addended by: Juanetta Beets on: 05/08/2021 09:35 AM   Modules accepted: Orders

## 2021-05-11 ENCOUNTER — Telehealth: Payer: Self-pay | Admitting: Oncology

## 2021-05-11 ENCOUNTER — Other Ambulatory Visit: Payer: Self-pay | Admitting: Pharmacist

## 2021-05-11 NOTE — Telephone Encounter (Signed)
05/11/21 Spoke with Ruta Hinds and scheduled IV Iron appts.

## 2021-05-12 ENCOUNTER — Telehealth: Payer: Self-pay

## 2021-05-12 NOTE — Telephone Encounter (Addendum)
I notified Marcie Bal, pt's dtr, of below. She states she has bought her mom some minoxidil cream to apply to her scalp.  RE: IV iron Received: Today Derwood Kaplan, MD  Dairl Ponder, RN Yes, I think that was the other reason I wanted to recheck it, iron def. can cause hair loss      I notified Marcie Bal, pt's dtr, of below. She verbalized understanding. She also asked me if having low iron would cause her moms hair to be falling out. Pt is very concerned.    ----- Message from Derwood Kaplan, MD sent at 05/12/2021  7:56 AM EST ----- Regarding: RE: IV iron Yes, that is what I told her at the time.  But the rest of the iron tests came back & transferrin is elevated, which means she is low.  I think she would benefit, esp since her hgb dropped. ----- Message ----- From: Dairl Ponder, RN Sent: 05/11/2021   4:37 PM EST To: Derwood Kaplan, MD Subject: IV iron                                        Pt's daughter, Marcie Bal, has called to ask about iron infusions that have been scheduled. She thought her mom didn't need the infusions. [phone]

## 2021-05-14 ENCOUNTER — Other Ambulatory Visit: Payer: Self-pay

## 2021-05-14 ENCOUNTER — Inpatient Hospital Stay: Payer: Medicare Other | Attending: Oncology

## 2021-05-14 VITALS — BP 140/57 | HR 77 | Temp 97.5°F | Resp 16

## 2021-05-14 DIAGNOSIS — Z79899 Other long term (current) drug therapy: Secondary | ICD-10-CM | POA: Insufficient documentation

## 2021-05-14 DIAGNOSIS — D7581 Myelofibrosis: Secondary | ICD-10-CM | POA: Diagnosis not present

## 2021-05-14 DIAGNOSIS — D509 Iron deficiency anemia, unspecified: Secondary | ICD-10-CM | POA: Diagnosis not present

## 2021-05-14 DIAGNOSIS — D473 Essential (hemorrhagic) thrombocythemia: Secondary | ICD-10-CM | POA: Diagnosis present

## 2021-05-14 MED ORDER — SODIUM CHLORIDE 0.9 % IV SOLN
510.0000 mg | Freq: Once | INTRAVENOUS | Status: AC
  Administered 2021-05-14: 510 mg via INTRAVENOUS
  Filled 2021-05-14: qty 17

## 2021-05-14 MED ORDER — SODIUM CHLORIDE 0.9 % IV SOLN: Freq: Once | INTRAVENOUS | Status: AC

## 2021-05-14 NOTE — Patient Instructions (Signed)

## 2021-05-18 ENCOUNTER — Other Ambulatory Visit: Payer: Self-pay | Admitting: Pharmacist

## 2021-05-18 MED FILL — Ferumoxytol Inj 510 MG/17ML (30 MG/ML) (Elemental Fe): INTRAVENOUS | Qty: 17 | Status: AC

## 2021-05-18 NOTE — Progress Notes (Signed)
Erin [name] received a call today that this patient was hospitalized at [facility] and she wanted to cancel the appointment for her 2nd dose of feraheme.  The patient stated that the hospital planned on giving her the 2nd dose of feraheme while she was there.

## 2021-05-19 ENCOUNTER — Ambulatory Visit: Payer: Medicare Other

## 2021-05-20 ENCOUNTER — Other Ambulatory Visit: Payer: Self-pay

## 2021-05-20 ENCOUNTER — Telehealth: Payer: Self-pay

## 2021-05-20 DIAGNOSIS — M5416 Radiculopathy, lumbar region: Secondary | ICD-10-CM

## 2021-05-20 NOTE — Telephone Encounter (Signed)
Patient's daughter called wanting to see if Dr. [name] could place another order for the back injection place. Patients daughter also wants to know if there is any other place closer to where patient lives that she could get the injection. Patient lives in [place] in assisted living. If not [facility] is fine she just has to get the assisted living place to arrange for transportation.

## 2021-05-20 NOTE — Telephone Encounter (Signed)
error 

## 2021-05-20 NOTE — Telephone Encounter (Signed)
Spoke with daughter. Order placed and f/u made.

## 2021-05-22 ENCOUNTER — Other Ambulatory Visit: Payer: Self-pay

## 2021-05-22 ENCOUNTER — Ambulatory Visit
Admission: RE | Admit: 2021-05-22 | Discharge: 2021-05-22 | Disposition: A | Discharge status: Home / Self Care | Payer: Medicare Other | Source: Ambulatory Visit | Attending: Family Medicine | Admitting: Family Medicine

## 2021-05-22 DIAGNOSIS — M5416 Radiculopathy, lumbar region: Secondary | ICD-10-CM

## 2021-05-22 MED ORDER — IOPAMIDOL (ISOVUE-M 200) INJECTION 41%
1.0000 mL | Freq: Once | INTRAMUSCULAR | Status: AC
  Administered 2021-05-22: 1 mL via EPIDURAL

## 2021-05-22 MED ORDER — METHYLPREDNISOLONE ACETATE 40 MG/ML INJ SUSP (RADIOLOG
80.0000 mg | Freq: Once | INTRAMUSCULAR | Status: AC
  Administered 2021-05-22: 80 mg via EPIDURAL

## 2021-05-22 NOTE — Discharge Instructions (Signed)

## 2021-05-27 NOTE — Progress Notes (Signed)
[place] [name] Phone: [phone] Subjective:   [name], am serving as a scribe for Dr. Hulan Saas.  This visit occurred during the SARS-CoV-2 public health emergency.  Safety protocols were in place, including screening questions prior to the visit, additional usage of staff PPE, and extensive cleaning of exam room while observing appropriate contact time as indicated for disinfecting solutions.  I'm seeing this patient by the request  of:  Townsend Roger, MD  CC: Low back and knee pain follow-up  KPT:[id number]  03/19/2021 Repeat injection given again today.  Patient continues to have the lower extremity swelling.  Patient is in [place] boots at this moment.  Discussed with patient about home exercises and icing regimen.  Patient does have significant dementia noted.  Is accompanied with daughter who we discussed a plan to follow-up again in 8 to 10 weeks if this continues to give her some relief of pain for some time.  Updated 05/28/2021 ARIAHNA [name] is a 76 y.o. female coming in with complaint of bilateral knee pain and back pain. Pain in back since epidural on 05/22/2021. Daughter feels that epidural has been helpful.   Both knees continue to bother patient. Pain more over medial aspect.        Past Medical History:  Diagnosis Date   Asthma    CHF (congestive heart failure) ([place])    hospital 11/2013-- [name]   Chronic kidney disease    Depression    Diabetes mellitus ([place])    GERD (gastroesophageal reflux disease)    Hyperlipidemia    Hypertension    Stroke [facility])    Past Surgical History:  Procedure Laterality Date   CHOLECYSTECTOMY  2002   LOOP RECORDER INSERTION N/A 08/01/2017   Procedure: LOOP RECORDER INSERTION;  Surgeon: Constance Haw, MD;  Location: [place] [facility] LAB;  Service: Cardiovascular;  Laterality: N/A;   SPINE SURGERY  aug 2015   [name]   TEE WITHOUT  CARDIOVERSION N/A 08/01/2017   Procedure: TRANSESOPHAGEAL ECHOCARDIOGRAM (TEE);  Surgeon: Sanda Klein, MD;  Location: [facility] ENDOSCOPY;  Service: Cardiovascular;  Laterality: N/A;   TONSILLECTOMY     TUBAL LIGATION     Social History   Socioeconomic History   Marital status: Widowed    Spouse name: Not on file   Number of children: 4   Years of education: 52   Highest education level: 12th grade  Occupational History   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, production: [employer]  Tobacco Use   Smoking status: Former    Packs/day: 1.50    Years: 20.00    Pack years: 30.00    Types: Cigarettes    Quit date: 08/02/1984    Years since quitting: 36.8   Smokeless tobacco: Never  Vaping Use   Vaping Use: Never used  Substance and Sexual Activity   Alcohol use: No   Drug use: No   Sexual activity: Not Currently    Partners: Male  Other Topics Concern   Not on file  Social History Narrative   NO REG EXERCISE   Caffeine use: daily   Social Determinants of Health   Financial Resource Strain: Low Risk    Difficulty of Paying Living Expenses: Not hard at all  Food Insecurity: No Food Insecurity   Worried About Charity fundraiser in the Last Year: Never true   [place] in the Last Year:  Never true  Transportation Needs: No Transportation Needs   Lack of Transportation (Medical): No   Lack of Transportation (Non-Medical): No  Physical Activity: Inactive   Days of Exercise per Week: 0 days   Minutes of Exercise per Session: 0 min  Stress: No Stress Concern Present   Feeling of Stress : Only a little  Social Connections: Socially Isolated   Frequency of Communication with Friends and Family: More than three times a week   Frequency of Social Gatherings with Friends and Family: More than three times a week   Attends Religious Services: Never   Marine scientist or Organizations: No   Attends Archivist Meetings: Never   Marital Status: Widowed   Allergies   Allergen Reactions   Bee Venom    Family History  Problem Relation Age of Onset   Hypertension Mother    Alzheimer's disease Mother    Hyperlipidemia Mother    Heart disease Father        cad   Asthma Father    Cancer Father 65       leukemia   Stroke Father    Hypertension Father    Heart disease Sister 69       MI   Pulmonary fibrosis Sister    Arthritis Brother    Heart disease Maternal Uncle    Stroke Maternal Uncle    Uterine cancer Paternal Grandmother    Stomach cancer Paternal Grandfather    Heart disease Maternal Uncle    Heart disease Maternal Uncle    Cancer Maternal Aunt        leukemia    Current Outpatient Medications (Endocrine & Metabolic):    Insulin Glargine (BASAGLAR KWIKPEN) 100 UNIT/ML, Inject 100 Units into the skin at bedtime. Per [facility] med list  Current Outpatient Medications (Cardiovascular):    atorvastatin (LIPITOR) 40 MG tablet, TAKE 1 TABLET (40 MG TOTAL) BY MOUTH DAILY. PT NEEDS OV FOR FURTHER REFILLS   carvedilol (COREG) 6.25 MG tablet, TAKE 1.5 TABLETS (9.375 MG TOTAL) BY MOUTH 2 (TWO) TIMES DAILY WITH A MEAL.   torsemide (DEMADEX) 20 MG tablet, 1 tablet at 2pm, 2 tablets in the morning per med list at [facility]  Current Outpatient Medications (Respiratory):    albuterol (PROVENTIL) (2.5 MG/3ML) 0.083% nebulizer solution, Take 3 mLs (2.5 mg total) by nebulization every 6 (six) hours as needed for wheezing or shortness of breath.   albuterol (VENTOLIN HFA) 108 (90 Base) MCG/ACT inhaler, Inhale 2 puffs into the lungs every 4 (four) hours as needed for wheezing or shortness of breath.   Current Outpatient Medications (Analgesics):    Acetaminophen (TYLENOL 8 HOUR PO), Take 500 mg by mouth.   Acetaminophen 500 MG capsule, Take 1 capsule by mouth every 8 (eight) hours as needed.   allopurinol (ZYLOPRIM) 300 MG tablet, Take 1 tablet (300 mg total) by mouth daily.   HYDROcodone-acetaminophen (NORCO/VICODIN) 5-325 MG tablet, Take 1 tablet  by mouth daily at 12 noon. Takes daily at 12pm  Current Outpatient Medications (Hematological):    vitamin B-12 (CYANOCOBALAMIN) 500 MCG tablet, Take 1 tablet by mouth daily.  Current Outpatient Medications (Other):    busPIRone (BUSPAR) 15 MG tablet, Take 1 tablet (15 mg total) by mouth 3 (three) times daily. Pt needs OV for further refills   Cholecalciferol 25 MCG (1000 UT) tablet, Take 1 tablet by mouth daily.   diclofenac Sodium (VOLTAREN) 1 % GEL, Apply 1 application topically 2 (two) times daily as  needed. 2 grams to knees twice a day for pain   diclofenac Sodium (VOLTAREN) 1 % GEL, Apply 2 g topically 2 (two) times daily as needed. To affected shoulder twice a day as needed for pain   gabapentin (NEURONTIN) 300 MG capsule, Take 1 capsule (300 mg total) by mouth at bedtime. (Patient taking differently: Take 300 mg by mouth 2 (two) times daily.)   melatonin 3 MG TABS tablet, Take 3 mg by mouth at bedtime.   Multiple Vitamin (MULTI-VITAMIN) tablet, Take 1 tablet by mouth daily.   ondansetron (ZOFRAN) 4 MG tablet, Take 4 mg by mouth every 6 (six) hours as needed for nausea or vomiting.   pantoprazole (PROTONIX) 40 MG tablet, TAKE 1 TABLET BY MOUTH EVERY DAY   pramipexole (MIRAPEX) 1 MG tablet, TAKE 1 TABLET BY MOUTH AT BEDTIME.   Sennosides-Docusate Sodium 8.6-50 MG CAPS, Take 1 tablet by mouth daily.   sertraline (ZOLOFT) 50 MG tablet, TAKE 1 AND 1/2 TABLETS DAILY BY MOUTH   Reviewed prior external information including notes and imaging from  primary care provider As well as notes that were available from care everywhere and other healthcare systems.  Past medical history, social, surgical and family history all reviewed in electronic medical record.  No pertanent information unless stated regarding to the chief complaint.   Review of Systems:  No headache, visual changes, nausea, vomiting, diarrhea, constipation, dizziness, abdominal pain, skin rash, fevers, chills, night sweats,  weight loss, swollen lymph nodes, b joint swelling, chest pain, shortness of breath, mood changes. POSITIVE muscle aches, body aches  Objective  Blood pressure (!) 124/58, pulse 72, height 5\' 2"  (1.575 m), SpO2 99 %.   General: No apparent distress alert and happy but not oriented HEENT: Pupils equal, extraocular movements intact  Respiratory: Patient's speak in full sentences and does not appear short of breath  Cardiovascular: 2+ lower extremity edema, tender, no erythema  Patient is in a wheelchair Instability of the knees bilaterally.  Severe tenderness to palpation even to light sensation.  Patient does have crepitus with range of motion.  After informed written and verbal consent, patient was seated on exam table. Right knee was prepped with alcohol swab and utilizing anterolateral approach, patient's right knee space was injected with 4:1  marcaine 0.5%: Kenalog 40mg /dL. Patient tolerated the procedure well without immediate complications.  After informed written and verbal consent, patient was seated on exam table. Left knee was prepped with alcohol swab and utilizing anterolateral approach, patient's left knee space was injected with 4:1  marcaine 0.5%: Kenalog 40mg /dL. Patient tolerated the procedure well without immediate complications.   Impression and Recommendations:    The above documentation has been reviewed and is accurate and complete Lyndal Pulley, DO

## 2021-05-28 ENCOUNTER — Encounter: Payer: Self-pay | Admitting: Family Medicine

## 2021-05-28 ENCOUNTER — Ambulatory Visit (INDEPENDENT_AMBULATORY_CARE_PROVIDER_SITE_OTHER): Payer: Medicare Other | Admitting: Family Medicine

## 2021-05-28 ENCOUNTER — Other Ambulatory Visit: Payer: Self-pay

## 2021-05-28 DIAGNOSIS — M549 Dorsalgia, unspecified: Secondary | ICD-10-CM

## 2021-05-28 DIAGNOSIS — M170 Bilateral primary osteoarthritis of knee: Secondary | ICD-10-CM

## 2021-05-28 NOTE — Patient Instructions (Signed)
Injected both knees today See me again in 3 months

## 2021-05-28 NOTE — Assessment & Plan Note (Signed)
Chronic problem with exacerbation, level 5 caveat secondary to severe dementia.  Patient is significantly better spirits than usual.  Given injections of because patient did have significant crepitus of the knees and was in a wheelchair.  Hopefully patient will make some progress.  Discussed icing regimen and home exercises.  Increase activity slowly.  Follow-up again in 3 months.  Chronic problem with exacerbation and due to patient's other comorbidities and severe dementia is not a candidate for replacement.

## 2021-05-28 NOTE — Assessment & Plan Note (Signed)
Responding to the epidurals at this moment.  Can repeat if necessary.  Follow-up with me again in 3 months.

## 2021-06-25 ENCOUNTER — Ambulatory Visit: Payer: Medicare Other | Admitting: Family Medicine

## 2021-07-26 DIAGNOSIS — I350 Nonrheumatic aortic (valve) stenosis: Secondary | ICD-10-CM

## 2021-07-26 DIAGNOSIS — I361 Nonrheumatic tricuspid (valve) insufficiency: Secondary | ICD-10-CM

## 2021-07-27 DIAGNOSIS — I4510 Unspecified right bundle-branch block: Secondary | ICD-10-CM | POA: Diagnosis not present

## 2021-08-24 NOTE — Progress Notes (Signed)
[facility]  [address] [place],  [place]  [address] [phone]  Clinic Day:  08/25/21  Referring physician: Townsend Roger, MD   CHIEF COMPLAINT:  CC:  Myelofibrosis  Current Treatment:  Surveillance   HISTORY OF PRESENT ILLNESS:  Erin [name] is a 76 y.o. female who I saw many years ago for thrombocytosis and she was referred back to me in July of 2018 for thrombocytopenia.  She has also been found to have anemia and both her hemoglobin and platelet count have been decreasing.  Her hemoglobin was down to 9.8 in July 2018 with a platelet count of 74,000. Her white count was elevated at 13,000 with 81% neutrophils, 8% lymphocytes, 9% monocytes, 1% eosinophils, and 1% basophils.  However, when I rechecked these counts on July 26th, I found more of a left shift and her white count was up to 16.6.  Her hemoglobin was a little better at 11.2 with an MCV of 84 and platelet count of 83,000. This time her differential revealed 61% neutrophils, 16% bands, 6% lymphocytes, 3% monocytes, 1% basophils, 5% metamyelocytes, 6% myelocytes and 2% blasts.  A reticulocyte count was 5.4%, sed rate was 93 and LDH was markedly elevated at 1939.  No monoclonal spike was seen.  I reviewed her peripheral blood smear myself and did confirm a rare blast and promyelocyte.  She was understandably anxious since her father had some form of leukemia at age 63 and lived 5 years.  She thinks this may have been chronic myelogenous leukemia.  An aunt also had leukemia.  She does note dyspnea with exertion.  She does see a nephrologist in [facility] for chronic kidney disease, Toye Lufadeju, MD.  She also has venous stasis and has wraps on her legs.  She complains of pain of her lower back and legs at a 6/10.  When I saw her in August 2018, her white count was still elevated at 14.5 with 45% neutrophils, 19% bands, 9% lymphocytes, 1% monocytes, 2% eosinophils, 2% basophils, 8%  metamyelocytes, 14% myelocytes and 1 nucleated red blood cell per 100 WBC's.  Her hemoglobin was stable at 11.3 but her platelet count was mildly lower at 74,000 and a smear revealed occasional teardrops.  JAK 2 mutation was negative.  We therefore performed a bone marrow on August 22nd and this was consistent with myelofibrosis.  The cytogenetics revealed normal 65 XX chromosomes and PCR for bcr/ABL was negative.  The sample was very limited as the aspirate was mainly blood.  There was a predominance of granulocytes, red cells and occasional atypical megakaryocytes associated with reticulin fibrosis, for a diagnosis of myelofibrosis.  Since that time she has had a stroke, and she has worsening vascular dementia.    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 This will cause her blood counts to be chronically low, and does not require treatment at this time. I would only transfuse her if her hemoglobin drops to 7.5 or less.   INTERVAL HISTORY:  [name] is here for routine follow up and states that she is fair. She reports pain with ambulation and knee pain so remains in a wheelchair.She uses Hydrocodone for the pain. She also has chronic bilateral lower extremity edema. I ordered iron studies and TSH in January and she was found to have iron deficiency again. She did receive IV iron with improvement, as she did not respond to oral iron in the past. Lab evaluation reveals  a stable white count of 4.1 with an ANC of 3280, a hemoglobin of 9.4 and a platelet count of 103,000, all improved. Chemistries are unremarkable except for a BUN of 70 and a creatinine of 1.6, stable. In January this was 83 and 1.5.She has been hospitalized 6 times this year, for CHF and cellulits of the lower extremities. Her  appetite is good, and she is eating well.  She denies fever, chills or other signs of infection.  She denies nausea, vomiting, bowel issues, or abdominal pain.  She denies sore throat,  cough, dyspnea, or chest pain.  REVIEW OF SYSTEMS:  Review of Systems  Constitutional:  Positive for fatigue. Negative for appetite change, chills, fever and unexpected weight change.       Hair thinning  HENT:  Negative.    Eyes: Negative.   Respiratory: Negative.  Negative for chest tightness, cough, hemoptysis, shortness of breath and wheezing.   Cardiovascular:  Negative for chest pain and palpitations. Leg swelling: bilateral, severe. Gastrointestinal: Negative.  Negative for abdominal distention, abdominal pain, blood in stool, constipation, diarrhea, nausea and vomiting.  Endocrine: Negative.   Genitourinary: Negative.  Negative for difficulty urinating, dysuria, frequency and hematuria.   Musculoskeletal:  Positive for arthralgias, back pain and gait problem (in a wheelchair). Negative for flank pain and myalgias.  Skin: Negative.   Neurological:  Positive for gait problem (in a wheelchair). Negative for dizziness, extremity weakness, headaches, light-headedness, numbness, seizures and speech difficulty.  Hematological: Negative.   Psychiatric/Behavioral: Negative.  Negative for depression and sleep disturbance. The patient is not nervous/anxious.      VITALS:  Blood pressure (!) 165/72, pulse 67, temperature (!) 97.5 F (36.4 C), temperature source Oral, resp. rate 18, SpO2 95 %.  Wt Readings from Last 3 Encounters:  10/07/20 220 lb (99.8 kg)  08/26/20 215 lb 3.2 oz (97.6 kg)  08/15/20 224 lb (101.6 kg)    There is no height or weight on file to calculate BMI.  Performance status (ECOG): 2 - Symptomatic, <50% confined to bed  PHYSICAL EXAM:  Physical Exam Constitutional:      General: She is not in acute distress.    Appearance: Normal appearance. She is normal weight.  HENT:     Head: Normocephalic and atraumatic.     Comments: Hair thinning Eyes:     General: No scleral icterus.    Extraocular Movements: Extraocular movements intact.     Conjunctiva/sclera:  Conjunctivae normal.     Pupils: Pupils are equal, round, and reactive to light.  Cardiovascular:     Rate and Rhythm: Normal rate and regular rhythm.     Pulses: Normal pulses.     Heart sounds: Normal heart sounds. No murmur heard.    No friction rub. No gallop.  Pulmonary:     Effort: Pulmonary effort is normal. No respiratory distress.     Breath sounds: Rales present.     Comments: Mild bibasilar rales Abdominal:     General: Bowel sounds are normal. There is no distension.     Palpations: Abdomen is soft. There is no hepatomegaly, splenomegaly or mass.     Tenderness: There is no abdominal tenderness.  Musculoskeletal:        General: Normal range of motion.     Cervical back: Normal range of motion and neck supple.     Right lower leg: Edema (2-3+, with severe venous changes, and ace bandages in place) present.     Left lower leg: Edema (2-3+,  with severe venous changes, and ace bandages in place) present.  Lymphadenopathy:     Cervical: No cervical adenopathy.  Skin:    General: Skin is warm and dry.  Neurological:     General: No focal deficit present.     Mental Status: She is alert and oriented to person, place, and time. Mental status is at baseline.  Psychiatric:        Mood and Affect: Mood normal.        Behavior: Behavior normal.        Thought Content: Thought content normal.        Judgment: Judgment normal.    LABS:      Latest Ref Rng & Units 08/25/2021   12:00 AM 04/27/2021   12:00 AM 11/27/2020    3:50 PM  CBC  WBC  4.1     4.0     9.6   Hemoglobin 12.0 - 16.0 9.4     8.3     9.5   Hematocrit 36 - 46 29     25     28.5   Platelets 150 - 400 K/uL 103     87     75.0      This result is from an external source.      Latest Ref Rng & Units 08/25/2021   12:00 AM 04/27/2021   12:00 AM 11/27/2020    3:50 PM  CMP  Glucose 70 - 99 mg/dL   91   BUN 4 - 21 70     83     76   Creatinine 0.5 - 1.1 1.6     1.5     1.70   Sodium 137 - 147 139     136      135   Potassium 3.5 - 5.1 mEq/L 4.2     3.7     3.9   Chloride 99 - 108 101     106     99   CO2 13 - 22 26     19     22    Calcium 8.7 - 10.7 8.9     8.8     8.9   Total Protein 6.0 - 8.3 g/dL   6.5   Total Bilirubin 0.2 - 1.2 mg/dL   1.2   Alkaline Phos 25 - 125 111     122     127   AST 13 - 35 29     34     74   ALT 7 - 35 U/L 25     22     84      This result is from an external source.     No results found for: "TOTALPROTELP", "ALBUMINELP", "A1GS", "A2GS", "BETS", "BETA2SER", "GAMS", "MSPIKE", "SPEI" Lab Results  Component Value Date   TIBC 299 04/27/2021   TIBC 290 04/27/2021   TIBC 389 04/03/2020   FERRITIN 108 04/27/2021   FERRITIN 98 04/27/2021   FERRITIN 273.3 08/26/2020   IRONPCTSAT 20 04/27/2021   IRONPCTSAT 19 04/27/2021   IRONPCTSAT 14.8 (L) 08/26/2020   Lab Results  Component Value Date   LDH 1,016 (A) 04/03/2020    STUDIES:  No results found.     HISTORY:   Allergies:  Allergies  Allergen Reactions   Bee Venom     Current Medications: Current Outpatient Medications  Medication Sig Dispense Refill   Acetaminophen (TYLENOL 8 HOUR PO) Take 500 mg by mouth.  Acetaminophen 500 MG capsule Take 1 capsule by mouth every 8 (eight) hours as needed.     albuterol (PROVENTIL) (2.5 MG/3ML) 0.083% nebulizer solution Take 3 mLs (2.5 mg total) by nebulization every 6 (six) hours as needed for wheezing or shortness of breath. 75 mL 12   albuterol (VENTOLIN HFA) 108 (90 Base) MCG/ACT inhaler Inhale 2 puffs into the lungs every 4 (four) hours as needed for wheezing or shortness of breath.      allopurinol (ZYLOPRIM) 300 MG tablet Take 1 tablet (300 mg total) by mouth daily. 90 tablet 3   atorvastatin (LIPITOR) 40 MG tablet TAKE 1 TABLET (40 MG TOTAL) BY MOUTH DAILY. PT NEEDS OV FOR FURTHER REFILLS 90 tablet 1   busPIRone (BUSPAR) 15 MG tablet Take 1 tablet (15 mg total) by mouth 3 (three) times daily. Pt needs OV for further refills 270 tablet 0   carvedilol  (COREG) 6.25 MG tablet TAKE 1.5 TABLETS (9.375 MG TOTAL) BY MOUTH 2 (TWO) TIMES DAILY WITH A MEAL. 170 tablet 1   Cholecalciferol 25 MCG (1000 UT) tablet Take 1 tablet by mouth daily.     diclofenac Sodium (VOLTAREN) 1 % GEL Apply 1 application topically 2 (two) times daily as needed. 2 grams to knees twice a day for pain     diclofenac Sodium (VOLTAREN) 1 % GEL Apply 2 g topically 2 (two) times daily as needed. To affected shoulder twice a day as needed for pain     gabapentin (NEURONTIN) 300 MG capsule Take 1 capsule (300 mg total) by mouth at bedtime. (Patient taking differently: Take 300 mg by mouth 2 (two) times daily.) 90 capsule 3   HYDROcodone-acetaminophen (NORCO/VICODIN) 5-325 MG tablet Take 1 tablet by mouth 3 (three) times daily. Per Dr. Dwyane Dee, patient is to take this three times a day for pain     melatonin 3 MG TABS tablet Take 3 mg by mouth at bedtime.     Multiple Vitamin (MULTI-VITAMIN) tablet Take 1 tablet by mouth daily.     ondansetron (ZOFRAN) 4 MG tablet Take 4 mg by mouth every 6 (six) hours as needed for nausea or vomiting.     pantoprazole (PROTONIX) 40 MG tablet TAKE 1 TABLET BY MOUTH EVERY DAY 90 tablet 1   pramipexole (MIRAPEX) 1 MG tablet TAKE 1 TABLET BY MOUTH AT BEDTIME. 90 tablet 1   Sennosides-Docusate Sodium 8.6-50 MG CAPS Take 1 tablet by mouth daily.     sertraline (ZOLOFT) 50 MG tablet TAKE 1 AND 1/2 TABLETS DAILY BY MOUTH 135 tablet 1   torsemide (DEMADEX) 20 MG tablet 1 tablet at 2pm, 2 tablets in the morning per med list at [facility]     vitamin B-12 (CYANOCOBALAMIN) 500 MCG tablet Take 1 tablet by mouth daily.     No current facility-administered medications for this visit.     ASSESSMENT & PLAN:   Assessment:   1.  Myelofibrosis, diagnosed in August 2018. This likely will not threaten her life, and so only routine surveillance is recommended.  She has pancytopenia, but her counts will chronically be low and there is no specific therapy for her. I do  expect that her blood counts will slowly worsen over time and there is a slight possibility of evolution into leukemia.   2.  Iron deficiency in the past, which did not respond to oral iron supplement and is aggravated by her myelofibrosis.  She received 2 doses of IV iron in January 2022 with improvement. Iron studies  from October did not reveal deficiency, but she was iron deficient again in January 2023 and received IV iron again with good partial response. I will repeat iron studies again later in the year.  3. Pneumonia and cellulitis in September 2022, treated with doxycycline.  4. Chronic kidney disease, stable.  5. Severe lower extremity edema. She is on torsemide 20 mg TID per her med list, however, this remains severe.  6. Vascular dementia. She seems to be doing fairly well today with her mental status and speech.  7. Alopecia, likely from her iron deficiency, and may be improving.  8.  Multiple hospital admissions for CHF and cellulitis this year.  Plan: At this time, we will continue surveillance to monitor her blood counts. We have treated her iron deficiency, which is likely aggravated by her myelofibrosis. She is not a candidate for an aggressive GI evaluation. There is no evidence of hemolysis and we have ruled out other deficiency in the past. I will see her back in 4 months with repeat CBC and comprehensive metabolic profile.  She and her daughter understand and agree with this plan of care.  I provided 20 minutes of face-to-face time during this this encounter and > 50% was spent counseling as documented under my assessment and plan.    Derwood Kaplan, MD [facility] AT [facility] [address] [place] [address] Dept: [phone] Dept Fax: [phone]

## 2021-08-25 ENCOUNTER — Inpatient Hospital Stay: Payer: Medicare Other

## 2021-08-25 ENCOUNTER — Other Ambulatory Visit: Payer: Self-pay | Admitting: Oncology

## 2021-08-25 ENCOUNTER — Inpatient Hospital Stay: Payer: Medicare Other | Attending: Oncology | Admitting: Oncology

## 2021-08-25 ENCOUNTER — Telehealth: Payer: Self-pay | Admitting: Oncology

## 2021-08-25 ENCOUNTER — Encounter: Payer: Self-pay | Admitting: Oncology

## 2021-08-25 VITALS — BP 165/72 | HR 67 | Temp 97.5°F | Resp 18

## 2021-08-25 DIAGNOSIS — D473 Essential (hemorrhagic) thrombocythemia: Secondary | ICD-10-CM

## 2021-08-25 DIAGNOSIS — D474 Osteomyelofibrosis: Secondary | ICD-10-CM

## 2021-08-25 DIAGNOSIS — D696 Thrombocytopenia, unspecified: Secondary | ICD-10-CM | POA: Diagnosis not present

## 2021-08-25 LAB — CBC: RBC: 3.07 — AB (ref 3.87–5.11)

## 2021-08-25 LAB — CBC AND DIFFERENTIAL
HCT: 29 — AB (ref 36–46)
Hemoglobin: 9.4 — AB (ref 12.0–16.0)
Neutrophils Absolute: 3.28
Platelets: 103 10*3/uL — AB (ref 150–400)
WBC: 4.1

## 2021-08-25 LAB — BASIC METABOLIC PANEL
BUN: 70 — AB (ref 4–21)
CO2: 26 — AB (ref 13–22)
Chloride: 101 (ref 99–108)
Creatinine: 1.6 — AB (ref 0.5–1.1)
Glucose: 132
Potassium: 4.2 mEq/L (ref 3.5–5.1)
Sodium: 139 (ref 137–147)

## 2021-08-25 LAB — HEPATIC FUNCTION PANEL
ALT: 25 U/L (ref 7–35)
AST: 29 (ref 13–35)
Alkaline Phosphatase: 111 (ref 25–125)
Bilirubin, Total: 0.6

## 2021-08-25 LAB — COMPREHENSIVE METABOLIC PANEL
Albumin: 4.1 (ref 3.5–5.0)
Calcium: 8.9 (ref 8.7–10.7)

## 2021-08-25 NOTE — Telephone Encounter (Signed)
Patient has been scheduled for follow-up visit per 08/25/21 los. Pt's daughter notified of appt via phone.

## 2021-08-26 NOTE — Progress Notes (Unsigned)
Erin [name] Phone: [phone] Subjective:    I'm seeing this patient by the request  of:  Jaclynn Major, NP  CC:   [id number]  05/28/2021 Responding to the epidurals at this moment.  Can repeat if necessary.  Follow-up with me again in 3 months.  Chronic problem with exacerbation, level 5 caveat secondary to severe dementia.  Patient is significantly better spirits than usual.  Given injections of because patient did have significant crepitus of the knees and was in a wheelchair.  Hopefully patient will make some progress.  Discussed icing regimen and home exercises.  Increase activity slowly.  Follow-up again in 3 months.  Chronic problem with exacerbation and due to patient's other comorbidities and severe dementia is not a candidate for replacement.  Updated 08/27/2021 Erin [name] is a 76 y.o. female coming in with complaint of bilateral knee pain       Past Medical History:  Diagnosis Date   Asthma    CHF (congestive heart failure) [facility])    hospital 11/2013-- [place]   Chronic kidney disease    Depression    Diabetes mellitus ([place])    GERD (gastroesophageal reflux disease)    Hyperlipidemia    Hypertension    Stroke [facility])    Past Surgical History:  Procedure Laterality Date   CHOLECYSTECTOMY  2002   LOOP RECORDER INSERTION N/A 08/01/2017   Procedure: LOOP RECORDER INSERTION;  Surgeon: Constance Haw, MD;  Location: [employer] CV LAB;  Service: Cardiovascular;  Laterality: N/A;   SPINE SURGERY  aug 2015   [facility]   TEE WITHOUT CARDIOVERSION N/A 08/01/2017   Procedure: TRANSESOPHAGEAL ECHOCARDIOGRAM (TEE);  Surgeon: Sanda Klein, MD;  Location: [facility] ENDOSCOPY;  Service: Cardiovascular;  Laterality: N/A;   TONSILLECTOMY     TUBAL LIGATION     Social History   Socioeconomic History   Marital status: Widowed    Spouse name: Not on file   Number of children: 4    Years of education: 40   Highest education level: 12th grade  Occupational History   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, production: [employer]  Tobacco Use   Smoking status: Former    Packs/day: 1.50    Years: 20.00    Pack years: 30.00    Types: Cigarettes    Quit date: 08/02/1984    Years since quitting: 37.0   Smokeless tobacco: Never  Vaping Use   Vaping Use: Never used  Substance and Sexual Activity   Alcohol use: No   Drug use: No   Sexual activity: Not Currently    Partners: Male  Other Topics Concern   Not on file  Social History Narrative   NO REG EXERCISE   Caffeine use: daily   Social Determinants of Health   Financial Resource Strain: Low Risk    Difficulty of Paying Living Expenses: Not hard at all  Food Insecurity: No Food Insecurity   Worried About Charity fundraiser in the Last Year: Never true   [place] in the Last Year: Never true  Transportation Needs: No Transportation Needs   Lack of Transportation (Medical): No   Lack of Transportation (Non-Medical): No  Physical Activity: Inactive   Days of Exercise per Week: 0 days   Minutes of Exercise per Session: 0 min  Stress: No Stress Concern Present   Feeling of Stress : Only a little  Social Connections:  Socially Isolated   Frequency of Communication with Friends and Family: More than three times a week   Frequency of Social Gatherings with Friends and Family: More than three times a week   Attends Religious Services: Never   Marine scientist or Organizations: No   Attends Archivist Meetings: Never   Marital Status: Widowed   Allergies  Allergen Reactions   Bee Venom    Family History  Problem Relation Age of Onset   Hypertension Mother    Alzheimer's disease Mother    Hyperlipidemia Mother    Heart disease Father        cad   Asthma Father    Cancer Father 29       leukemia   Stroke Father    Hypertension Father    Heart disease Sister 54       MI    Pulmonary fibrosis Sister    Arthritis Brother    Heart disease Maternal Uncle    Stroke Maternal Uncle    Uterine cancer Paternal Grandmother    Stomach cancer Paternal Grandfather    Heart disease Maternal Uncle    Heart disease Maternal Uncle    Cancer Maternal Aunt        leukemia     Current Outpatient Medications (Cardiovascular):    atorvastatin (LIPITOR) 40 MG tablet, TAKE 1 TABLET (40 MG TOTAL) BY MOUTH DAILY. PT NEEDS OV FOR FURTHER REFILLS   carvedilol (COREG) 6.25 MG tablet, TAKE 1.5 TABLETS (9.375 MG TOTAL) BY MOUTH 2 (TWO) TIMES DAILY WITH A MEAL.   torsemide (DEMADEX) 20 MG tablet, 1 tablet at 2pm, 2 tablets in the morning per med list at [facility]  Current Outpatient Medications (Respiratory):    albuterol (PROVENTIL) (2.5 MG/3ML) 0.083% nebulizer solution, Take 3 mLs (2.5 mg total) by nebulization every 6 (six) hours as needed for wheezing or shortness of breath.   albuterol (VENTOLIN HFA) 108 (90 Base) MCG/ACT inhaler, Inhale 2 puffs into the lungs every 4 (four) hours as needed for wheezing or shortness of breath.   Current Outpatient Medications (Analgesics):    Acetaminophen (TYLENOL 8 HOUR PO), Take 500 mg by mouth.   Acetaminophen 500 MG capsule, Take 1 capsule by mouth every 8 (eight) hours as needed.   allopurinol (ZYLOPRIM) 300 MG tablet, Take 1 tablet (300 mg total) by mouth daily.   HYDROcodone-acetaminophen (NORCO/VICODIN) 5-325 MG tablet, Take 1 tablet by mouth 3 (three) times daily. Per Dr. Dwyane Dee, patient is to take this three times a day for pain  Current Outpatient Medications (Hematological):    vitamin B-12 (CYANOCOBALAMIN) 500 MCG tablet, Take 1 tablet by mouth daily.  Current Outpatient Medications (Other):    busPIRone (BUSPAR) 15 MG tablet, Take 1 tablet (15 mg total) by mouth 3 (three) times daily. Pt needs OV for further refills   Cholecalciferol 25 MCG (1000 UT) tablet, Take 1 tablet by mouth daily.   diclofenac Sodium (VOLTAREN) 1 % GEL,  Apply 1 application topically 2 (two) times daily as needed. 2 grams to knees twice a day for pain   diclofenac Sodium (VOLTAREN) 1 % GEL, Apply 2 g topically 2 (two) times daily as needed. To affected shoulder twice a day as needed for pain   gabapentin (NEURONTIN) 300 MG capsule, Take 1 capsule (300 mg total) by mouth at bedtime. (Patient taking differently: Take 300 mg by mouth 2 (two) times daily.)   melatonin 3 MG TABS tablet, Take 3 mg by mouth at  bedtime.   Multiple Vitamin (MULTI-VITAMIN) tablet, Take 1 tablet by mouth daily.   ondansetron (ZOFRAN) 4 MG tablet, Take 4 mg by mouth every 6 (six) hours as needed for nausea or vomiting.   pantoprazole (PROTONIX) 40 MG tablet, TAKE 1 TABLET BY MOUTH EVERY DAY   pramipexole (MIRAPEX) 1 MG tablet, TAKE 1 TABLET BY MOUTH AT BEDTIME.   Sennosides-Docusate Sodium 8.6-50 MG CAPS, Take 1 tablet by mouth daily.   sertraline (ZOLOFT) 50 MG tablet, TAKE 1 AND 1/2 TABLETS DAILY BY MOUTH   Reviewed prior external information including notes and imaging from  primary care provider As well as notes that were available from care everywhere and other healthcare systems.  Past medical history, social, surgical and family history all reviewed in electronic medical record.  No pertanent information unless stated regarding to the chief complaint.   Review of Systems:  No headache, visual changes, nausea, vomiting, diarrhea, constipation, dizziness, abdominal pain, skin rash, fevers, chills, night sweats, weight loss, swollen lymph nodes, body aches, joint swelling, chest pain, shortness of breath, mood changes. POSITIVE muscle aches  Objective  There were no vitals taken for this visit.   General: No apparent distress alert and oriented x3 mood and affect normal, dressed appropriately.  HEENT: Pupils equal, extraocular movements intact  Respiratory: Patient's speak in full sentences and does not appear short of breath  Cardiovascular: No lower extremity  edema, non tender, no erythema  Gait normal with good balance and coordination.  MSK:  Non tender with full range of motion and good stability and symmetric strength and tone of shoulders, elbows, wrist, hip, knee and ankles bilaterally.     Impression and Recommendations:     The above documentation has been reviewed and is accurate and complete Delsa Sale

## 2021-08-27 ENCOUNTER — Ambulatory Visit: Payer: Medicare Other | Admitting: Family Medicine

## 2021-09-10 NOTE — Progress Notes (Deleted)
[place] Phone: [phone] Subjective:    I'm seeing this patient by the request  of:  Jaclynn Major, NP  CC:   [id number]  05/28/2021 Responding to the epidurals at this moment.  Can repeat if necessary.  Follow-up with me again in 3 months.  Chronic problem with exacerbation, level 5 caveat secondary to severe dementia.  Patient is significantly better spirits than usual.  Given injections of because patient did have significant crepitus of the knees and was in a wheelchair.  Hopefully patient will make some progress.  Discussed icing regimen and home exercises.  Increase activity slowly.  Follow-up again in 3 months.  Chronic problem with exacerbation and due to patient's other comorbidities and severe dementia is not a candidate for replacement.  Updated 09/15/2021 Erin [name] is a 76 y.o. female coming in with complaint of bilateral knee and back pain       Past Medical History:  Diagnosis Date   Asthma    CHF (congestive heart failure) [facility])    hospital 11/2013-- [place]   Chronic kidney disease    Depression    Diabetes mellitus ([place])    GERD (gastroesophageal reflux disease)    Hyperlipidemia    Hypertension    Stroke [facility])    Past Surgical History:  Procedure Laterality Date   CHOLECYSTECTOMY  2002   LOOP RECORDER INSERTION N/A 08/01/2017   Procedure: LOOP RECORDER INSERTION;  Surgeon: Constance Haw, MD;  Location: [place] CV LAB;  Service: Cardiovascular;  Laterality: N/A;   SPINE SURGERY  aug 2015   [facility]   TEE WITHOUT CARDIOVERSION N/A 08/01/2017   Procedure: TRANSESOPHAGEAL ECHOCARDIOGRAM (TEE);  Surgeon: Sanda Klein, MD;  Location: [facility] ENDOSCOPY;  Service: Cardiovascular;  Laterality: N/A;   TONSILLECTOMY     TUBAL LIGATION     Social History   Socioeconomic History   Marital status: Widowed    Spouse name: Not on file   Number of  children: 4   Years of education: 89   Highest education level: 12th grade  Occupational History   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, production: [employer]  Tobacco Use   Smoking status: Former    Packs/day: 1.50    Years: 20.00    Total pack years: 30.00    Types: Cigarettes    Quit date: 08/02/1984    Years since quitting: 37.1   Smokeless tobacco: Never  Vaping Use   Vaping Use: Never used  Substance and Sexual Activity   Alcohol use: No   Drug use: No   Sexual activity: Not Currently    Partners: Male  Other Topics Concern   Not on file  Social History Narrative   NO REG EXERCISE   Caffeine use: daily   Social Determinants of Health   Financial Resource Strain: Low Risk  (09/05/2020)   Overall Financial Resource Strain (CARDIA)    Difficulty of Paying Living Expenses: Not hard at all  Food Insecurity: No Food Insecurity (09/05/2020)   Hunger Vital Sign    Worried About Running Out of Food in the Last Year: Never true    [place] in the Last Year: Never true  Transportation Needs: No Transportation Needs (09/05/2020)   PRAPARE - Hydrologist (Medical): No    Lack of Transportation (Non-Medical): No  Physical Activity: Inactive (09/05/2020)   Exercise Vital Sign  Days of Exercise per Week: 0 days    Minutes of Exercise per Session: 0 min  Stress: No Stress Concern Present (09/05/2020)   [place]    Feeling of Stress : Only a little  Social Connections: Socially Isolated (09/05/2020)   Social Connection and Isolation Panel [NHANES]    Frequency of Communication with Friends and Family: More than three times a week    Frequency of Social Gatherings with Friends and Family: More than three times a week    Attends Religious Services: Never    Marine scientist or Organizations: No    Attends Archivist Meetings: Never    Marital Status: Widowed    Allergies  Allergen Reactions   Bee Venom    Family History  Problem Relation Age of Onset   Hypertension Mother    Alzheimer's disease Mother    Hyperlipidemia Mother    Heart disease Father        cad   Asthma Father    Cancer Father 70       leukemia   Stroke Father    Hypertension Father    Heart disease Sister 92       MI   Pulmonary fibrosis Sister    Arthritis Brother    Heart disease Maternal Uncle    Stroke Maternal Uncle    Uterine cancer Paternal Grandmother    Stomach cancer Paternal Grandfather    Heart disease Maternal Uncle    Heart disease Maternal Uncle    Cancer Maternal Aunt        leukemia     Current Outpatient Medications (Cardiovascular):    atorvastatin (LIPITOR) 40 MG tablet, TAKE 1 TABLET (40 MG TOTAL) BY MOUTH DAILY. PT NEEDS OV FOR FURTHER REFILLS   carvedilol (COREG) 6.25 MG tablet, TAKE 1.5 TABLETS (9.375 MG TOTAL) BY MOUTH 2 (TWO) TIMES DAILY WITH A MEAL.   torsemide (DEMADEX) 20 MG tablet, 1 tablet at 2pm, 2 tablets in the morning per med list at [facility]  Current Outpatient Medications (Respiratory):    albuterol (PROVENTIL) (2.5 MG/3ML) 0.083% nebulizer solution, Take 3 mLs (2.5 mg total) by nebulization every 6 (six) hours as needed for wheezing or shortness of breath.   albuterol (VENTOLIN HFA) 108 (90 Base) MCG/ACT inhaler, Inhale 2 puffs into the lungs every 4 (four) hours as needed for wheezing or shortness of breath.   Current Outpatient Medications (Analgesics):    Acetaminophen (TYLENOL 8 HOUR PO), Take 500 mg by mouth.   Acetaminophen 500 MG capsule, Take 1 capsule by mouth every 8 (eight) hours as needed.   allopurinol (ZYLOPRIM) 300 MG tablet, Take 1 tablet (300 mg total) by mouth daily.   HYDROcodone-acetaminophen (NORCO/VICODIN) 5-325 MG tablet, Take 1 tablet by mouth 3 (three) times daily. Per Dr. Dwyane Dee, patient is to take this three times a day for pain  Current Outpatient Medications (Hematological):    vitamin  B-12 (CYANOCOBALAMIN) 500 MCG tablet, Take 1 tablet by mouth daily.  Current Outpatient Medications (Other):    busPIRone (BUSPAR) 15 MG tablet, Take 1 tablet (15 mg total) by mouth 3 (three) times daily. Pt needs OV for further refills   Cholecalciferol 25 MCG (1000 UT) tablet, Take 1 tablet by mouth daily.   diclofenac Sodium (VOLTAREN) 1 % GEL, Apply 1 application topically 2 (two) times daily as needed. 2 grams to knees twice a day for pain   diclofenac Sodium (VOLTAREN) 1 %  GEL, Apply 2 g topically 2 (two) times daily as needed. To affected shoulder twice a day as needed for pain   gabapentin (NEURONTIN) 300 MG capsule, Take 1 capsule (300 mg total) by mouth at bedtime. (Patient taking differently: Take 300 mg by mouth 2 (two) times daily.)   melatonin 3 MG TABS tablet, Take 3 mg by mouth at bedtime.   Multiple Vitamin (MULTI-VITAMIN) tablet, Take 1 tablet by mouth daily.   ondansetron (ZOFRAN) 4 MG tablet, Take 4 mg by mouth every 6 (six) hours as needed for nausea or vomiting.   pantoprazole (PROTONIX) 40 MG tablet, TAKE 1 TABLET BY MOUTH EVERY DAY   pramipexole (MIRAPEX) 1 MG tablet, TAKE 1 TABLET BY MOUTH AT BEDTIME.   Sennosides-Docusate Sodium 8.6-50 MG CAPS, Take 1 tablet by mouth daily.   sertraline (ZOLOFT) 50 MG tablet, TAKE 1 AND 1/2 TABLETS DAILY BY MOUTH   Reviewed prior external information including notes and imaging from  primary care provider As well as notes that were available from care everywhere and other healthcare systems.  Past medical history, social, surgical and family history all reviewed in electronic medical record.  No pertanent information unless stated regarding to the chief complaint.   Review of Systems:  No headache, visual changes, nausea, vomiting, diarrhea, constipation, dizziness, abdominal pain, skin rash, fevers, chills, night sweats, weight loss, swollen lymph nodes, body aches, joint swelling, chest pain, shortness of breath, mood changes.  POSITIVE muscle aches  Objective  There were no vitals taken for this visit.   General: No apparent distress alert and oriented x3 mood and affect normal, dressed appropriately.  HEENT: Pupils equal, extraocular movements intact  Respiratory: Patient's speak in full sentences and does not appear short of breath  Cardiovascular: No lower extremity edema, non tender, no erythema  Gait normal with good balance and coordination.  MSK:  Non tender with full range of motion and good stability and symmetric strength and tone of shoulders, elbows, wrist, hip, knee and ankles bilaterally.     Impression and Recommendations:     The above documentation has been reviewed and is accurate and complete Delsa Sale

## 2021-09-15 ENCOUNTER — Ambulatory Visit: Payer: Medicare Other | Admitting: Family Medicine

## 2021-09-17 ENCOUNTER — Other Ambulatory Visit: Payer: Self-pay

## 2021-09-17 ENCOUNTER — Telehealth: Payer: Self-pay | Admitting: Family Medicine

## 2021-09-17 DIAGNOSIS — M5416 Radiculopathy, lumbar region: Secondary | ICD-10-CM

## 2021-09-17 NOTE — Telephone Encounter (Signed)
Order placed patient notified. Scheduled for f/u as well.

## 2021-09-17 NOTE — Telephone Encounter (Signed)
Pt daughter called. Would like [place] to order another epidural for [facility].  Erin [name] was recently hospitalized and is now in rehab ( edema, CHF ). Pt daughter advises the LBP and leg pain is similar to the past and is really painful for Erin [name].

## 2021-09-24 ENCOUNTER — Ambulatory Visit
Admission: RE | Admit: 2021-09-24 | Discharge: 2021-09-24 | Disposition: A | Discharge status: Home / Self Care | Payer: Medicare Other | Source: Ambulatory Visit | Attending: Family Medicine | Admitting: Family Medicine

## 2021-09-24 DIAGNOSIS — M5416 Radiculopathy, lumbar region: Secondary | ICD-10-CM

## 2021-09-24 MED ORDER — METHYLPREDNISOLONE ACETATE 40 MG/ML INJ SUSP (RADIOLOG
80.0000 mg | Freq: Once | INTRAMUSCULAR | Status: AC
  Administered 2021-09-24: 80 mg via EPIDURAL

## 2021-09-24 MED ORDER — IOPAMIDOL (ISOVUE-M 200) INJECTION 41%
1.0000 mL | Freq: Once | INTRAMUSCULAR | Status: AC
  Administered 2021-09-24: 1 mL via EPIDURAL

## 2021-09-24 NOTE — Discharge Instructions (Signed)

## 2021-10-19 NOTE — Progress Notes (Unsigned)
[place] Phone: [phone] Subjective:   Erin [name], am serving as a scribe for Dr. Hulan Saas.  I'm seeing this patient by the request  of:  Jaclynn Major, NP  CC: back and knee pain   [id number]  05/28/2021 Chronic problem with exacerbation, level 5 caveat secondary to severe dementia.  Patient is significantly better spirits than usual.  Given injections of because patient did have significant crepitus of the knees and was in a wheelchair.  Hopefully patient will make some progress.  Discussed icing regimen and home exercises.  Increase activity slowly.  Follow-up again in 3 months.  Chronic problem with exacerbation and due to patient's other comorbidities and severe dementia is not a candidate for replacement.  Responding to the epidurals at this moment.  Can repeat if necessary.  Follow-up with me again in 3 months.  Updated 10/20/2021 Erin [name] is a 76 y.o. female coming in with complaint of back pain and bilateral knee pain. Epi f/u. Unsure if epidural worked. Paitnet unable to get out of bed to see if things are achy.  Reviewed patient's chart and since last admission in June patient has been in the long-term assisted living facility at the moment.  Patient did have the epidural but difficult to say if anything made improvement.  B knee pain. R>L. Pain is radiating from R hip to R knee.       Past Medical History:  Diagnosis Date   Asthma    CHF (congestive heart failure) ([place])    hospital 11/2013-- [place]   Chronic kidney disease    Depression    Diabetes mellitus ([place])    GERD (gastroesophageal reflux disease)    Hyperlipidemia    Hypertension    Stroke [facility])    Past Surgical History:  Procedure Laterality Date   CHOLECYSTECTOMY  2002   LOOP RECORDER INSERTION N/A 08/01/2017   Procedure: LOOP RECORDER INSERTION;  Surgeon: Constance Haw, MD;  Location: [facility];  Service: Cardiovascular;  Laterality: N/A;   SPINE SURGERY  aug 2015   [facility]   TEE WITHOUT CARDIOVERSION N/A 08/01/2017   Procedure: TRANSESOPHAGEAL ECHOCARDIOGRAM (TEE);  Surgeon: Sanda Klein, MD;  Location: [facility] ENDOSCOPY;  Service: Cardiovascular;  Laterality: N/A;   TONSILLECTOMY     TUBAL LIGATION     Social History   Socioeconomic History   Marital status: Widowed    Spouse name: Not on file   Number of children: 4   Years of education: 53   Highest education level: 12th grade  Occupational History   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, production: [employer]  Tobacco Use   Smoking status: Former    Packs/day: 1.50    Years: 20.00    Total pack years: 30.00    Types: Cigarettes    Quit date: 08/02/1984    Years since quitting: 37.2   Smokeless tobacco: Never  Vaping Use   Vaping Use: Never used  Substance and Sexual Activity   Alcohol use: [name]   Drug use: [name]   Sexual activity: Not Currently    Partners: Male  Other Topics Concern   Not on file  Social History Narrative   [name] REG EXERCISE   Caffeine use: daily   Social Determinants of Health   Financial Resource Strain: Low Risk  (09/05/2020)   Overall Financial Resource Strain (CARDIA)    Difficulty of Paying  Living Expenses: Not hard at all  Food Insecurity: [name] Food Insecurity (09/05/2020)   Hunger Vital Sign    Worried About Running Out of Food in the Last Year: Never true    Ran Out of Food in the Last Year: Never true  Transportation Needs: [name] Transportation Needs (09/05/2020)   PRAPARE - Hydrologist (Medical): [name]    Lack of Transportation (Non-Medical): [name]  Physical Activity: Inactive (09/05/2020)   Exercise Vital Sign    Days of Exercise per Week: 0 days    Minutes of Exercise per Session: 0 min  Stress: [name] Stress Concern Present (09/05/2020)   [place]    Feeling of Stress : Only  a little  Social Connections: Socially Isolated (09/05/2020)   Social Connection and Isolation Panel [NHANES]    Frequency of Communication with Friends and Family: More than three times a week    Frequency of Social Gatherings with Friends and Family: More than three times a week    Attends Religious Services: Never    Marine scientist or Organizations: [name]    Attends Archivist Meetings: Never    Marital Status: Widowed   Allergies  Allergen Reactions   Bee Venom    Family History  Problem Relation Age of Onset   Hypertension Mother    Alzheimer's disease Mother    Hyperlipidemia Mother    Heart disease Father        cad   Asthma Father    Cancer Father 16       leukemia   Stroke Father    Hypertension Father    Heart disease Sister 37       MI   Pulmonary fibrosis Sister    Arthritis Brother    Heart disease Maternal Uncle    Stroke Maternal Uncle    Uterine cancer Paternal Grandmother    Stomach cancer Paternal Grandfather    Heart disease Maternal Uncle    Heart disease Maternal Uncle    Cancer Maternal Aunt        leukemia     Current Outpatient Medications (Cardiovascular):    atorvastatin (LIPITOR) 40 MG tablet, TAKE 1 TABLET (40 MG TOTAL) BY MOUTH DAILY. PT NEEDS OV FOR FURTHER REFILLS   carvedilol (COREG) 6.25 MG tablet, TAKE 1.5 TABLETS (9.375 MG TOTAL) BY MOUTH 2 (TWO) TIMES DAILY WITH A MEAL.   torsemide (DEMADEX) 20 MG tablet, 1 tablet at 2pm, 2 tablets in the morning per med list at [facility]  Current Outpatient Medications (Respiratory):    albuterol (PROVENTIL) (2.5 MG/3ML) 0.083% nebulizer solution, Take 3 mLs (2.5 mg total) by nebulization every 6 (six) hours as needed for wheezing or shortness of breath.   albuterol (VENTOLIN HFA) 108 (90 Base) MCG/ACT inhaler, Inhale 2 puffs into the lungs every 4 (four) hours as needed for wheezing or shortness of breath.   Current Outpatient Medications (Analgesics):    Acetaminophen (TYLENOL 8  HOUR PO), Take 500 mg by mouth.   Acetaminophen 500 MG capsule, Take 1 capsule by mouth every 8 (eight) hours as needed.   allopurinol (ZYLOPRIM) 300 MG tablet, Take 1 tablet (300 mg total) by mouth daily.   HYDROcodone-acetaminophen (NORCO/VICODIN) 5-325 MG tablet, Take 1 tablet by mouth 3 (three) times daily. Per Dr. Dwyane Dee, patient is to take this three times a day for pain  Current Outpatient Medications (Hematological):    vitamin B-12 (CYANOCOBALAMIN) 500  MCG tablet, Take 1 tablet by mouth daily.  Current Outpatient Medications (Other):    busPIRone (BUSPAR) 15 MG tablet, Take 1 tablet (15 mg total) by mouth 3 (three) times daily. Pt needs OV for further refills   Cholecalciferol 25 MCG (1000 UT) tablet, Take 1 tablet by mouth daily.   diclofenac Sodium (VOLTAREN) 1 % GEL, Apply 1 application topically 2 (two) times daily as needed. 2 grams to knees twice a day for pain   diclofenac Sodium (VOLTAREN) 1 % GEL, Apply 2 g topically 2 (two) times daily as needed. To affected shoulder twice a day as needed for pain   gabapentin (NEURONTIN) 300 MG capsule, Take 1 capsule (300 mg total) by mouth at bedtime. (Patient taking differently: Take 300 mg by mouth 2 (two) times daily.)   melatonin 3 MG TABS tablet, Take 3 mg by mouth at bedtime.   Multiple Vitamin (MULTI-VITAMIN) tablet, Take 1 tablet by mouth daily.   ondansetron (ZOFRAN) 4 MG tablet, Take 4 mg by mouth every 6 (six) hours as needed for nausea or vomiting.   pantoprazole (PROTONIX) 40 MG tablet, TAKE 1 TABLET BY MOUTH EVERY DAY   pramipexole (MIRAPEX) 1 MG tablet, TAKE 1 TABLET BY MOUTH AT BEDTIME.   Sennosides-Docusate Sodium 8.6-50 MG CAPS, Take 1 tablet by mouth daily.   sertraline (ZOLOFT) 50 MG tablet, TAKE 1 AND 1/2 TABLETS DAILY BY MOUTH   Reviewed prior external information including notes and imaging from  primary care provider As well as notes that were available from care everywhere and other healthcare systems.  Discussed  with patient's daughter who is beside her today stating that she has been now in an assisted living facility for greater than 1 month.  Past medical history, social, surgical and family history all reviewed in electronic medical record.  [name] pertanent information unless stated regarding to the chief complaint.   Review of Systems: Level 5 caveat for rate review of systems secondary to dementia and patient being pan positive  Objective  Blood pressure 130/74, pulse 63, height 5\' 2"  (1.575 m), SpO2 99 %.   General: [name] apparent distress alert and oriented x3 mood and affect normal, dressed appropriately.  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  Instability noted of the knees.  Patient now has pain that is out of proportion even to light palpation patient starts to [place]  Patient is sitting in a wheelchair  After informed written and verbal consent, patient was seated on exam table. Right knee was prepped with alcohol swab and utilizing anterolateral approach, patient's right knee space was injected with 4:1  marcaine 0.5%: Kenalog 40mg /dL. Patient tolerated the procedure well without immediate complications.  After informed written and verbal consent, patient was seated on exam table. Left knee was prepped with alcohol swab and utilizing anterolateral approach, patient's left knee space was injected with 4:1  marcaine 0.5%: Kenalog 40mg /dL. Patient tolerated the procedure well without immediate complications.    Impression and Recommendations:    The above documentation has been reviewed and is accurate and complete Lyndal Pulley, DO

## 2021-10-20 ENCOUNTER — Ambulatory Visit (INDEPENDENT_AMBULATORY_CARE_PROVIDER_SITE_OTHER): Payer: Medicare Other | Admitting: Family Medicine

## 2021-10-20 DIAGNOSIS — M170 Bilateral primary osteoarthritis of knee: Secondary | ICD-10-CM

## 2021-10-20 DIAGNOSIS — I872 Venous insufficiency (chronic) (peripheral): Secondary | ICD-10-CM | POA: Diagnosis not present

## 2021-10-20 NOTE — Assessment & Plan Note (Signed)
Discussed with family as well that the chronic venous insufficiency could also be potentially playing a role.  Encouraged her to continue to follow-up with her primary care provider as well as monitor for any signs and symptoms of inflammation, no signs of any infectious etiology noted today.

## 2021-10-20 NOTE — Patient Instructions (Signed)
Injected both knees today Will take some time to start working See me again in 3 months

## 2021-10-20 NOTE — Assessment & Plan Note (Signed)
Patient given repeat injection today.  Tolerated the procedure well.  Patient is now in assisted living facility and I think it is very beneficial for her to be there.  Patient does have a nearing end-stage dementia.  Patient is highly emotional as well.  Do not know how much these injections will be significantly helpful for her but in the past patient's family does state that she does seem to do better for a while. F/u 3 months

## 2021-11-18 NOTE — Progress Notes (Unsigned)
[place] Phone: [phone] Subjective:   Erin [name], am serving as a scribe for Dr. Hulan Saas.  I'm seeing this patient by the request  of:  Jaclynn Major, NP  CC: Knee pain bilaterally right greater than left  [id number]  10/20/2021 Discussed with family as well that the chronic venous insufficiency could also be potentially playing a role.  Encouraged her to continue to follow-up with her primary care provider as well as monitor for any signs and symptoms of inflammation, [name] signs of any infectious etiology noted today.  Patient given repeat injection today.  Tolerated the procedure well.  Patient is now in assisted living facility and I think it is very beneficial for her to be there.  Patient does have a nearing end-stage dementia.  Patient is highly emotional as well.  Do not know how much these injections will be significantly helpful for her but in the past patient's family does state that she does seem to do better for a while. F/u 3 months   Updated 11/19/2021 Erin [name] is a 76 y.o. female coming in with complaint of bilateral knee pain. R leg and posterior knee pain that radiates into foot. Walking 60 ft with walker assistance with PT. Is not transferring by herself.  Patient is having more difficulty.  States that she has more pain than usual.      Past Medical History:  Diagnosis Date   Asthma    CHF (congestive heart failure) ([place])    hospital 11/2013-- [place]   Chronic kidney disease    Depression    Diabetes mellitus ([place])    GERD (gastroesophageal reflux disease)    Hyperlipidemia    Hypertension    Stroke [facility])    Past Surgical History:  Procedure Laterality Date   CHOLECYSTECTOMY  2002   LOOP RECORDER INSERTION N/A 08/01/2017   Procedure: LOOP RECORDER INSERTION;  Surgeon: Constance Haw, MD;  Location: [facility];  Service: Cardiovascular;   Laterality: N/A;   SPINE SURGERY  aug 2015   [facility]   TEE WITHOUT CARDIOVERSION N/A 08/01/2017   Procedure: TRANSESOPHAGEAL ECHOCARDIOGRAM (TEE);  Surgeon: Sanda Klein, MD;  Location: [facility] ENDOSCOPY;  Service: Cardiovascular;  Laterality: N/A;   TONSILLECTOMY     TUBAL LIGATION     Social History   Socioeconomic History   Marital status: Widowed    Spouse name: Not on file   Number of children: 4   Years of education: 62   Highest education level: 12th grade  Occupational History   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, production: [employer]  Tobacco Use   Smoking status: Former    Packs/day: 1.50    Years: 20.00    Total pack years: 30.00    Types: Cigarettes    Quit date: 08/02/1984    Years since quitting: 37.3   Smokeless tobacco: Never  Vaping Use   Vaping Use: Never used  Substance and Sexual Activity   Alcohol use: [name]   Drug use: [name]   Sexual activity: Not Currently    Partners: Male  Other Topics Concern   Not on file  Social History Narrative   [name] REG EXERCISE   Caffeine use: daily   Social Determinants of Health   Financial Resource Strain: Low Risk  (09/05/2020)   Overall Financial Resource Strain (CARDIA)    Difficulty of Paying Living Expenses: Not hard at  all  Food Insecurity: [name] Food Insecurity (09/05/2020)   Hunger Vital Sign    Worried About Running Out of Food in the Last Year: Never true    Ran Out of Food in the Last Year: Never true  Transportation Needs: [name] Transportation Needs (09/05/2020)   PRAPARE - Hydrologist (Medical): [name]    Lack of Transportation (Non-Medical): [name]  Physical Activity: Inactive (09/05/2020)   Exercise Vital Sign    Days of Exercise per Week: 0 days    Minutes of Exercise per Session: 0 min  Stress: [name] Stress Concern Present (09/05/2020)   [place]    Feeling of Stress : Only a little  Social Connections: Socially  Isolated (09/05/2020)   Social Connection and Isolation Panel [NHANES]    Frequency of Communication with Friends and Family: More than three times a week    Frequency of Social Gatherings with Friends and Family: More than three times a week    Attends Religious Services: Never    Marine scientist or Organizations: [name]    Attends Archivist Meetings: Never    Marital Status: Widowed   Allergies  Allergen Reactions   Bee Venom    Family History  Problem Relation Age of Onset   Hypertension Mother    Alzheimer's disease Mother    Hyperlipidemia Mother    Heart disease Father        cad   Asthma Father    Cancer Father 74       leukemia   Stroke Father    Hypertension Father    Heart disease Sister 9       MI   Pulmonary fibrosis Sister    Arthritis Brother    Heart disease Maternal Uncle    Stroke Maternal Uncle    Uterine cancer Paternal Grandmother    Stomach cancer Paternal Grandfather    Heart disease Maternal Uncle    Heart disease Maternal Uncle    Cancer Maternal Aunt        leukemia     Current Outpatient Medications (Cardiovascular):    atorvastatin (LIPITOR) 40 MG tablet, TAKE 1 TABLET (40 MG TOTAL) BY MOUTH DAILY. PT NEEDS OV FOR FURTHER REFILLS   carvedilol (COREG) 6.25 MG tablet, TAKE 1.5 TABLETS (9.375 MG TOTAL) BY MOUTH 2 (TWO) TIMES DAILY WITH A MEAL.   torsemide (DEMADEX) 20 MG tablet, 1 tablet at 2pm, 2 tablets in the morning per med list at [facility]  Current Outpatient Medications (Respiratory):    albuterol (PROVENTIL) (2.5 MG/3ML) 0.083% nebulizer solution, Take 3 mLs (2.5 mg total) by nebulization every 6 (six) hours as needed for wheezing or shortness of breath.   albuterol (VENTOLIN HFA) 108 (90 Base) MCG/ACT inhaler, Inhale 2 puffs into the lungs every 4 (four) hours as needed for wheezing or shortness of breath.   Current Outpatient Medications (Analgesics):    Acetaminophen (TYLENOL 8 HOUR PO), Take 500 mg by mouth.    Acetaminophen 500 MG capsule, Take 1 capsule by mouth every 8 (eight) hours as needed.   allopurinol (ZYLOPRIM) 300 MG tablet, Take 1 tablet (300 mg total) by mouth daily.   HYDROcodone-acetaminophen (NORCO/VICODIN) 5-325 MG tablet, Take 1 tablet by mouth 3 (three) times daily. Per Dr. Dwyane Dee, patient is to take this three times a day for pain  Current Outpatient Medications (Hematological):    vitamin B-12 (CYANOCOBALAMIN) 500 MCG tablet, Take 1 tablet  by mouth daily.  Current Outpatient Medications (Other):    busPIRone (BUSPAR) 15 MG tablet, Take 1 tablet (15 mg total) by mouth 3 (three) times daily. Pt needs OV for further refills   Cholecalciferol 25 MCG (1000 UT) tablet, Take 1 tablet by mouth daily.   diclofenac Sodium (VOLTAREN) 1 % GEL, Apply 1 application topically 2 (two) times daily as needed. 2 grams to knees twice a day for pain   diclofenac Sodium (VOLTAREN) 1 % GEL, Apply 2 g topically 2 (two) times daily as needed. To affected shoulder twice a day as needed for pain   gabapentin (NEURONTIN) 300 MG capsule, Take 1 capsule (300 mg total) by mouth at bedtime. (Patient taking differently: Take 300 mg by mouth 2 (two) times daily.)   melatonin 3 MG TABS tablet, Take 3 mg by mouth at bedtime.   Multiple Vitamin (MULTI-VITAMIN) tablet, Take 1 tablet by mouth daily.   ondansetron (ZOFRAN) 4 MG tablet, Take 4 mg by mouth every 6 (six) hours as needed for nausea or vomiting.   pantoprazole (PROTONIX) 40 MG tablet, TAKE 1 TABLET BY MOUTH EVERY DAY   pramipexole (MIRAPEX) 1 MG tablet, TAKE 1 TABLET BY MOUTH AT BEDTIME.   Sennosides-Docusate Sodium 8.6-50 MG CAPS, Take 1 tablet by mouth daily.   sertraline (ZOLOFT) 50 MG tablet, TAKE 1 AND 1/2 TABLETS DAILY BY MOUTH   Reviewed prior external information including notes and imaging from  primary care provider As well as notes that were available from care everywhere and other healthcare systems.  Past medical history, social, surgical and  family history all reviewed in electronic medical record.  [name] pertanent information unless stated regarding to the chief complaint.   Review of Systems:  [name] headache, visual changes, nausea, vomiting, diarrhea, constipation, dizziness, abdominal pain, skin rash, fevers, chills, night sweats, weight loss, swollen lymph nodes,  chest pain, shortness of breath, mood changes. POSITIVE muscle aches, body aches, joint swelling  Objective  Blood pressure (!) 128/52, pulse (!) 51, height 5\' 2"  (1.575 m), SpO2 93 %.   General: [name] apparent distress alert and oriented x3 mood and affect normal, dressed appropriately.  HEENT: Pupils equal, extraocular movements intact  Respiratory: Patient's speak in full sentences and does not appear short of breath  Cardiovascular: 2+ lower extremity edema, non tender, [name] erythema  Right lower extremity is tenderness to palpation diffusely even to light palpation.  [name] masses appreciated at this point but worsening pain with palpation of the popliteal area.  Limited muscular skeletal ultrasound was performed and interpreted by Hulan Saas, M  Limited ultrasound shows that there may be a very small hypoechoic mass noted in the popliteal area but appears to be potentially ruptured with some drainage into the medial gastrocnemius area.  Mild pain with compression. Impression: Likely ruptured Baker's cyst    Impression and Recommendations:    The above documentation has been reviewed and is accurate and complete Lyndal Pulley, DO

## 2021-11-19 ENCOUNTER — Ambulatory Visit: Payer: Self-pay

## 2021-11-19 ENCOUNTER — Encounter: Payer: Self-pay | Admitting: Family Medicine

## 2021-11-19 ENCOUNTER — Ambulatory Visit (INDEPENDENT_AMBULATORY_CARE_PROVIDER_SITE_OTHER): Payer: Medicare Other | Admitting: Family Medicine

## 2021-11-19 VITALS — BP 128/52 | HR 51 | Ht 62.0 in

## 2021-11-19 DIAGNOSIS — M170 Bilateral primary osteoarthritis of knee: Secondary | ICD-10-CM

## 2021-11-19 DIAGNOSIS — M25561 Pain in right knee: Secondary | ICD-10-CM | POA: Diagnosis not present

## 2021-11-19 NOTE — Patient Instructions (Signed)
Good to see you Likely ruptured baker's cyst will continue to improve Balance coordination and confidence-Keep working with PT See me again in 2 months

## 2021-11-19 NOTE — Assessment & Plan Note (Signed)
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  Discussed with patient that I do think that there was a possibility of a Baker's cyst and would consider aspiration secondary to likely rupture at the moment I do not that it would be highly successful for any type of aspiration.  We discussed which activities otherwise such as compression could be beneficial.  Encouraged her to continue to work with formal physical therapy because I do think it would be the best thing for the.  Difficult to give her all this information secondary to her dementia but did talk to her daughter.

## 2021-12-15 ENCOUNTER — Encounter: Payer: Self-pay | Admitting: Podiatry

## 2021-12-22 ENCOUNTER — Ambulatory Visit: Payer: Medicare Other | Admitting: Podiatry

## 2021-12-22 NOTE — Progress Notes (Signed)
[facility]  [address] [place],  [place]  [address] [phone]  Clinic Day:  12/24/2021  Referring physician: Jaclynn Major, NP   CHIEF COMPLAINT:  CC:  Myelofibrosis  Current Treatment:  Surveillance   HISTORY OF PRESENT ILLNESS:  Erin [name] is a 76 y.o. female who I saw many years ago for thrombocytosis and she was referred back to me in July of 2018 for thrombocytopenia.  She has also been found to have anemia and both her hemoglobin and platelet count have been decreasing.  Her hemoglobin was down to 9.8 in July 2018 with a platelet count of 74,000. Her white count was elevated at 13,000 with 81% neutrophils, 8% lymphocytes, 9% monocytes, 1% eosinophils, and 1% basophils.  However, when I rechecked these counts on July 26th, I found more of a left shift and her white count was up to 16.6.  Her hemoglobin was a little better at 11.2 with an MCV of 84 and platelet count of 83,000. This time her differential revealed 61% neutrophils, 16% bands, 6% lymphocytes, 3% monocytes, 1% basophils, 5% metamyelocytes, 6% myelocytes and 2% blasts.  A reticulocyte count was 5.4%, sed rate was 93 and LDH was markedly elevated at 1939.  No monoclonal spike was seen.  I reviewed her peripheral blood smear myself and did confirm a rare blast and promyelocyte.  She was understandably anxious since her father had some form of leukemia at age 5 and lived 5 years.  She thinks this may have been chronic myelogenous leukemia.  An aunt also had leukemia.  She does note dyspnea with exertion.  She does see a nephrologist in [facility] with [facility] for chronic kidney disease, Toye Lufadeju, MD.  She also has venous stasis and has wraps on her legs.  She complains of pain of her lower back and legs at a 6/10.  When I saw her in August 2018, her white count was still elevated at 14.5 with 45% neutrophils, 19% bands, 9% lymphocytes, 1% monocytes, 2% eosinophils, 2%  basophils, 8% metamyelocytes, 14% myelocytes and 1 nucleated red blood cell per 100 WBC's.  Her hemoglobin was stable at 11.3 but her platelet count was mildly lower at 74,000 and a smear revealed occasional teardrops.  JAK 2 mutation was negative.  We therefore performed a bone marrow on August 22nd and this was consistent with myelofibrosis.  The cytogenetics revealed normal 34 XX chromosomes and PCR for bcr/ABL was negative.  The sample was very limited as the aspirate was mainly blood.  There was a predominance of granulocytes, red cells and occasional atypical megakaryocytes associated with reticulin fibrosis, for a diagnosis of myelofibrosis.  Since that time she has had a stroke, and she has worsening vascular dementia.    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 This will cause her blood counts to be chronically low, and does not require treatment at this time. I would only transfuse her if her hemoglobin drops to 7.5 or less.   INTERVAL HISTORY:  Erin [name] is here for routine follow up and her daughter states she was recently discharged Sunday from the hospital and her heart rate was at 36. While in the hospital, she was diuresed 18 pounds of fluid. Potassium was elevated, she had a UTI, bradycardia, but states she is doing slightly better today. She was admitted to the hospital on Wednesday night. She also has chronic bilateral lower extremity (leg and thighs) edema.  She complains of chronic  fatigue and sleepiness. She notes that it hurts to sleep due to her chronic leg pain from neuropathy. Her daughter notes she was walking 80 ft prior to going to the hospital and since then she has been walking 10 ft. She has been in physical therapy and speech therapy. Her daughter also notes acid reflux, coughing up food. She reports of pain in the back of her neck and I have suggested heat to the neck to help with pain. She also states she's been having shoulder pain, and was  given arthritis cream to help. Her BUN is 71 with a creatinine of 1.5, which is stable for her. The rest of her CMP is unremarkable, including a potassium of 5.0. Her hemoglobin is up to 9.7 and platelets are up to 110,000, these are all improved from her usual. Her  appetite is good, and she has gained 5 pounds since her last visit. She denies fever, chills or other signs of infection.  She denies nausea, vomiting, bowel issues, or abdominal pain.  She denies sore throat, cough, dyspnea, or chest pain.  REVIEW OF SYSTEMS:  Review of Systems  Constitutional:  Positive for fatigue. Negative for appetite change, chills, fever and unexpected weight change.       Hair thinning  HENT:  Negative.    Eyes: Negative.   Respiratory: Negative.  Negative for chest tightness, cough, hemoptysis, shortness of breath and wheezing.   Cardiovascular:  Negative for chest pain and palpitations. Leg swelling: bilateral, severe. Gastrointestinal: Negative.  Negative for abdominal distention, abdominal pain, blood in stool, constipation, diarrhea, nausea and vomiting.  Endocrine: Negative.   Genitourinary: Negative.  Negative for difficulty urinating, dysuria, frequency and hematuria.   Musculoskeletal:  Positive for arthralgias, back pain, gait problem (in a wheelchair) and neck pain. Negative for flank pain and myalgias.  Skin: Negative.   Neurological:  Positive for gait problem (in a wheelchair). Negative for dizziness, extremity weakness, headaches, light-headedness, numbness, seizures and speech difficulty.  Hematological: Negative.   Psychiatric/Behavioral: Negative.  Negative for depression and sleep disturbance. The patient is not nervous/anxious.      VITALS:  There were no vitals taken for this visit.  Wt Readings from Last 3 Encounters:  10/07/20 220 lb (99.8 kg)  08/26/20 215 lb 3.2 oz (97.6 kg)  08/15/20 224 lb (101.6 kg)    There is no height or weight on file to calculate BMI.  Performance  status (ECOG): 2 - Symptomatic, <50% confined to bed  PHYSICAL EXAM:  Physical Exam Constitutional:      General: She is not in acute distress.    Appearance: Normal appearance. She is normal weight.  HENT:     Head: Normocephalic and atraumatic.     Comments: Hair thinning Eyes:     General: No scleral icterus.    Extraocular Movements: Extraocular movements intact.     Conjunctiva/sclera: Conjunctivae normal.     Pupils: Pupils are equal, round, and reactive to light.  Cardiovascular:     Rate and Rhythm: Normal rate and regular rhythm.     Pulses: Normal pulses.     Heart sounds: Normal heart sounds. No murmur heard.    No friction rub. No gallop.  Pulmonary:     Effort: Pulmonary effort is normal. No respiratory distress.     Breath sounds: Rales present.     Comments: Mild bibasilar rales Abdominal:     General: Bowel sounds are normal. There is no distension.     Palpations: Abdomen  is soft. There is no hepatomegaly, splenomegaly or mass.     Tenderness: There is no abdominal tenderness.  Musculoskeletal:        General: Swelling present. Normal range of motion.     Cervical back: Normal range of motion and neck supple.     Right lower leg: Edema (2-3+, with severe venous changes, and ace bandages in place) present.     Left lower leg: Edema (2-3+, with severe venous changes, and ace bandages in place) present.     Comments: 2+ edema with chronic venous stasis changes Extensive ecchymosis in the upper extremities.  Lymphadenopathy:     Cervical: No cervical adenopathy.  Skin:    General: Skin is warm and dry.  Neurological:     General: No focal deficit present.     Mental Status: She is alert and oriented to person, place, and time. Mental status is at baseline.  Psychiatric:        Mood and Affect: Mood normal.        Behavior: Behavior normal.        Thought Content: Thought content normal.        Judgment: Judgment normal.     LABS:      Latest Ref Rng &  Units 08/25/2021   12:00 AM 04/27/2021   12:00 AM 11/27/2020    3:50 PM  CBC  WBC  4.1     4.0     9.6   Hemoglobin 12.0 - 16.0 9.4     8.3     9.5   Hematocrit 36 - 46 29     25     28.5   Platelets 150 - 400 K/uL 103     87     75.0      This result is from an external source.       Latest Ref Rng & Units 08/25/2021   12:00 AM 04/27/2021   12:00 AM 11/27/2020    3:50 PM  CMP  Glucose 70 - 99 mg/dL   91   BUN 4 - 21 70     83     76   Creatinine 0.5 - 1.1 1.6     1.5     1.70   Sodium 137 - 147 139     136     135   Potassium 3.5 - 5.1 mEq/L 4.2     3.7     3.9   Chloride 99 - 108 101     106     99   CO2 13 - 22 26     19     22    Calcium 8.7 - 10.7 8.9     8.8     8.9   Total Protein 6.0 - 8.3 g/dL   6.5   Total Bilirubin 0.2 - 1.2 mg/dL   1.2   Alkaline Phos 25 - 125 111     122     127   AST 13 - 35 29     34     74   ALT 7 - 35 U/L 25     22     84      This result is from an external source.      No results found for: "TOTALPROTELP", "ALBUMINELP", "A1GS", "A2GS", "BETS", "BETA2SER", "GAMS", "MSPIKE", "SPEI" Lab Results  Component Value Date   TIBC 299 04/27/2021   TIBC 290 04/27/2021   TIBC 389 04/03/2020  FERRITIN 108 04/27/2021   FERRITIN 98 04/27/2021   FERRITIN 273.3 08/26/2020   IRONPCTSAT 20 04/27/2021   IRONPCTSAT 19 04/27/2021   IRONPCTSAT 14.8 (L) 08/26/2020   Lab Results  Component Value Date   LDH 1,016 (A) 04/03/2020    STUDIES:  No results found.  EXAM:11/19/2021 [place] LIMITED JOINT SPACE STRUCTURES LOW RIGHT IMPRESSION: Limited muscular skeletal ultrasound was performed and interpreted by Hulan Saas, M  Limited ultrasound shows that there may be a very small hypoechoic mass noted in the popliteal area but appears to be potentially ruptured with some drainage into the medial gastrocnemius area.  Mild pain with compression. Impression: Likely ruptured Baker's cyst   EXAM:09/24/2021 DG INJECT DIAG/THERA/INC NEED;E/CATH/PLC  EPI/LUMB/SAC CLINICAL DATA:  Lumbosacral spondylosis without myelopathy. Bilateral low back and leg pain, worse on the left. Positive response to multiple prior epidural injections.   FLUOROSCOPY: Radiation Exposure Index (as provided by the fluoroscopic device): 5.0 mGy Kerma   PROCEDURE: The procedure, risks, benefits, and alternatives were explained to the patient. Questions regarding the procedure were encouraged and answered. The patient understands and consents to the procedure.   LUMBAR EPIDURAL INJECTION:   An interlaminar approach was performed on the left at L5-S1. The overlying skin was cleansed and anesthetized. A 3.5 inch 20 gauge epidural needle was advanced using loss-of-resistance technique.   DIAGNOSTIC EPIDURAL INJECTION:   Injection of Isovue-M 200 shows a good epidural pattern with spread above and below the level of needle placement, primarily on the left. No vascular opacification is seen.   THERAPEUTIC EPIDURAL INJECTION:   80 mg of Depo-Medrol mixed with 2 mL of 1% lidocaine were instilled. The procedure was well-tolerated, and the patient was discharged thirty minutes following the injection in good condition.   COMPLICATIONS: None immediate   IMPRESSION: Technically successful interlaminar epidural injection on the left at L5-S1.   HISTORY:   Allergies:  Allergies  Allergen Reactions   Bee Venom     Current Medications: Current Outpatient Medications  Medication Sig Dispense Refill   Acetaminophen (TYLENOL 8 HOUR PO) Take 500 mg by mouth.     Acetaminophen 500 MG capsule Take 1 capsule by mouth every 8 (eight) hours as needed.     albuterol (PROVENTIL) (2.5 MG/3ML) 0.083% nebulizer solution Take 3 mLs (2.5 mg total) by nebulization every 6 (six) hours as needed for wheezing or shortness of breath. 75 mL 12   albuterol (VENTOLIN HFA) 108 (90 Base) MCG/ACT inhaler Inhale 2 puffs into the lungs every 4 (four) hours as needed for wheezing or  shortness of breath.      allopurinol (ZYLOPRIM) 300 MG tablet Take 1 tablet (300 mg total) by mouth daily. 90 tablet 3   atorvastatin (LIPITOR) 40 MG tablet TAKE 1 TABLET (40 MG TOTAL) BY MOUTH DAILY. PT NEEDS OV FOR FURTHER REFILLS 90 tablet 1   busPIRone (BUSPAR) 15 MG tablet Take 1 tablet (15 mg total) by mouth 3 (three) times daily. Pt needs OV for further refills 270 tablet 0   carvedilol (COREG) 6.25 MG tablet TAKE 1.5 TABLETS (9.375 MG TOTAL) BY MOUTH 2 (TWO) TIMES DAILY WITH A MEAL. 170 tablet 1   Cholecalciferol 25 MCG (1000 UT) tablet Take 1 tablet by mouth daily.     diclofenac Sodium (VOLTAREN) 1 % GEL Apply 1 application topically 2 (two) times daily as needed. 2 grams to knees twice a day for pain     diclofenac Sodium (VOLTAREN) 1 % GEL Apply 2 g topically 2 (  two) times daily as needed. To affected shoulder twice a day as needed for pain     gabapentin (NEURONTIN) 300 MG capsule Take 1 capsule (300 mg total) by mouth at bedtime. (Patient taking differently: Take 300 mg by mouth 2 (two) times daily.) 90 capsule 3   HYDROcodone-acetaminophen (NORCO/VICODIN) 5-325 MG tablet Take 1 tablet by mouth 3 (three) times daily. Per Dr. Dwyane Dee, patient is to take this three times a day for pain     melatonin 3 MG TABS tablet Take 3 mg by mouth at bedtime.     Multiple Vitamin (MULTI-VITAMIN) tablet Take 1 tablet by mouth daily.     ondansetron (ZOFRAN) 4 MG tablet Take 4 mg by mouth every 6 (six) hours as needed for nausea or vomiting.     pantoprazole (PROTONIX) 40 MG tablet TAKE 1 TABLET BY MOUTH EVERY DAY 90 tablet 1   pramipexole (MIRAPEX) 1 MG tablet TAKE 1 TABLET BY MOUTH AT BEDTIME. 90 tablet 1   Sennosides-Docusate Sodium 8.6-50 MG CAPS Take 1 tablet by mouth daily.     sertraline (ZOLOFT) 50 MG tablet TAKE 1 AND 1/2 TABLETS DAILY BY MOUTH 135 tablet 1   torsemide (DEMADEX) 20 MG tablet 1 tablet at 2pm, 2 tablets in the morning per med list at [facility]     vitamin B-12 (CYANOCOBALAMIN)  500 MCG tablet Take 1 tablet by mouth daily.     No current facility-administered medications for this visit.     ASSESSMENT & PLAN:   Assessment:   1.  Myelofibrosis, diagnosed in August 2018. This likely will not threaten her life, and so only routine surveillance is recommended.  She has pancytopenia, but her counts will chronically be low and there is no specific therapy for her. Her blood counts are better than usual.  2.  Iron deficiency in the past, which did not respond to oral iron supplement and is aggravated by her myelofibrosis.  She received 2 doses of IV iron in January 2022 with improvement. Iron studies from October did not reveal deficiency, but she was iron deficient again in January 2023 and received IV iron again with good partial response. I will repeat iron studies again in the future.  3. Pneumonia and cellulitis in September 2022, treated with doxycycline.  4. Chronic kidney disease, stable.  5. Severe lower extremity edema.   6. Vascular dementia. She seems to be doing fairly well today with her mental status and speech.  7. Alopecia, likely from her iron deficiency, and may be improving.  8.  Multiple hospital admissions for CHF and cellulitis this year. Her most recent admission was last week for bradycardia and UTI.  Plan: At this time, we will continue surveillance to monitor her blood counts. I will see her back in 4 months with repeat CBC and comprehensive metabolic profile.  She and her daughter understand and agree with this plan of care.  I provided 20 minutes of face-to-face time during this this encounter and > 50% was spent counseling as documented under my assessment and plan.    Derwood Kaplan, MD [facility] [address] [place] [address] Dept: [phone] Dept Fax: [phone]     Ninetta Lights as a scribe for Derwood Kaplan, MD.,have  documented all relevant documentation on the behalf of Derwood Kaplan, MD,as directed by  Derwood Kaplan, MD while in the presence of Derwood Kaplan, MD.

## 2021-12-23 ENCOUNTER — Ambulatory Visit: Payer: Medicare Other | Admitting: Podiatry

## 2021-12-24 ENCOUNTER — Inpatient Hospital Stay: Payer: Medicare Other | Attending: Oncology | Admitting: Oncology

## 2021-12-24 ENCOUNTER — Inpatient Hospital Stay: Payer: Medicare Other

## 2021-12-24 ENCOUNTER — Encounter: Payer: Self-pay | Admitting: Oncology

## 2021-12-24 VITALS — BP 153/67 | HR 64 | Temp 97.4°F | Resp 19

## 2021-12-24 DIAGNOSIS — D474 Osteomyelofibrosis: Secondary | ICD-10-CM | POA: Diagnosis not present

## 2021-12-24 DIAGNOSIS — D473 Essential (hemorrhagic) thrombocythemia: Secondary | ICD-10-CM | POA: Diagnosis present

## 2021-12-24 DIAGNOSIS — D696 Thrombocytopenia, unspecified: Secondary | ICD-10-CM | POA: Diagnosis not present

## 2021-12-24 DIAGNOSIS — N189 Chronic kidney disease, unspecified: Secondary | ICD-10-CM | POA: Insufficient documentation

## 2021-12-24 DIAGNOSIS — D7581 Myelofibrosis: Secondary | ICD-10-CM | POA: Diagnosis not present

## 2021-12-24 LAB — COMPREHENSIVE METABOLIC PANEL
Albumin: 4 (ref 3.5–5.0)
Calcium: 9 (ref 8.7–10.7)

## 2021-12-24 LAB — CBC AND DIFFERENTIAL
HCT: 30 — AB (ref 36–46)
Hemoglobin: 9.7 — AB (ref 12.0–16.0)
Neutrophils Absolute: 3.48
Platelets: 110 10*3/uL — AB (ref 150–400)
WBC: 4.7

## 2021-12-24 LAB — BASIC METABOLIC PANEL
BUN: 71 — AB (ref 4–21)
CO2: 25 — AB (ref 13–22)
Chloride: 107 (ref 99–108)
Creatinine: 1.5 — AB (ref 0.5–1.1)
Glucose: 109
Potassium: 5 mEq/L (ref 3.5–5.1)
Sodium: 137 (ref 137–147)

## 2021-12-24 LAB — IRON AND TIBC
Iron: 69 ug/dL (ref 28–170)
Saturation Ratios: 29 % (ref 10.4–31.8)
TIBC: 236 ug/dL — ABNORMAL LOW (ref 250–450)
UIBC: 167 ug/dL

## 2021-12-24 LAB — HEPATIC FUNCTION PANEL
ALT: 20 U/L (ref 7–35)
AST: 26 (ref 13–35)
Alkaline Phosphatase: 105 (ref 25–125)
Bilirubin, Total: 0.6

## 2021-12-24 LAB — FERRITIN: Ferritin: 263 ng/mL (ref 11–307)

## 2021-12-24 LAB — CBC: RBC: 3.37 — AB (ref 3.87–5.11)

## 2022-01-13 NOTE — Progress Notes (Signed)
Erin [name] Phone: [phone] Subjective:   Erin [name], am serving as a scribe for Dr. Hulan Saas.  I'm seeing this patient by the request  of:  Erin Major, NP  CC: Bilateral knee pain  SNK:[id number]  11/19/2021 Patient does have significant arthritic changes in the knees.  Discussed with patient that I do think that there was a possibility of a Baker's cyst and would consider aspiration secondary to likely rupture at the moment I do not that it would be highly successful for any type of aspiration.  We discussed which activities otherwise such as compression could be beneficial.  Encouraged her to continue to work with formal physical therapy because I do think it would be the best thing for the.  Difficult to give her all this information secondary to her dementia but did talk to her daughter.  Updated 01/19/2022 Erin [name] is a 76 y.o. female coming in with complaint of B knee pain. Patient's daughter said that patient is complaining again of knee pain.  Patient is accompanied with daughter who does most of the talking for her.  Patient's this is a level 5 caveat       Past Medical History:  Diagnosis Date   Asthma    CHF (congestive heart failure) ([place])    hospital 11/2013-- [place]   Chronic kidney disease    Depression    Diabetes mellitus ([place])    GERD (gastroesophageal reflux disease)    Hyperlipidemia    Hypertension    Stroke [facility])    Past Surgical History:  Procedure Laterality Date   CHOLECYSTECTOMY  2002   LOOP RECORDER INSERTION N/A 08/01/2017   Procedure: LOOP RECORDER INSERTION;  Surgeon: Constance Haw, MD;  Location: [facility];  Service: Cardiovascular;  Laterality: N/A;   SPINE SURGERY  aug 2015   [facility]   TEE WITHOUT CARDIOVERSION N/A 08/01/2017   Procedure: TRANSESOPHAGEAL ECHOCARDIOGRAM (TEE);  Surgeon: Sanda Klein, MD;   Location: [facility] ENDOSCOPY;  Service: Cardiovascular;  Laterality: N/A;   TONSILLECTOMY     TUBAL LIGATION     Social History   Socioeconomic History   Marital status: Widowed    Spouse name: Not on file   Number of children: 4   Years of education: 54   Highest education level: 12th grade  Occupational History   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, production: [employer]  Tobacco Use   Smoking status: Former    Packs/day: 1.50    Years: 20.00    Total pack years: 30.00    Types: Cigarettes    Quit date: 08/02/1984    Years since quitting: 37.4   Smokeless tobacco: Never  Vaping Use   Vaping Use: Never used  Substance and Sexual Activity   Alcohol use: [name]   Drug use: [name]   Sexual activity: Not Currently    Partners: Male  Other Topics Concern   Not on file  Social History Narrative   [name] REG EXERCISE   Caffeine use: daily   Social Determinants of Health   Financial Resource Strain: Low Risk  (09/05/2020)   Overall Financial Resource Strain (CARDIA)    Difficulty of Paying Living Expenses: Not hard at all  Food Insecurity: [name] Food Insecurity (09/05/2020)   Hunger Vital Sign    Worried About Running Out of Food in the Last Year: Never true    Ran Out of  Food in the Last Year: Never true  Transportation Needs: [name] Transportation Needs (09/05/2020)   PRAPARE - Hydrologist (Medical): [name]    Lack of Transportation (Non-Medical): [name]  Physical Activity: Inactive (09/05/2020)   Exercise Vital Sign    Days of Exercise per Week: 0 days    Minutes of Exercise per Session: 0 min  Stress: [name] Stress Concern Present (09/05/2020)   [place]    Feeling of Stress : Only a little  Social Connections: Socially Isolated (09/05/2020)   Social Connection and Isolation Panel [NHANES]    Frequency of Communication with Friends and Family: More than three times a week    Frequency of Social Gatherings with  Friends and Family: More than three times a week    Attends Religious Services: Never    Marine scientist or Organizations: [name]    Attends Archivist Meetings: Never    Marital Status: Widowed   Allergies  Allergen Reactions   Bee Venom    Family History  Problem Relation Age of Onset   Hypertension Mother    Alzheimer's disease Mother    Hyperlipidemia Mother    Heart disease Father        cad   Asthma Father    Cancer Father 29       leukemia   Stroke Father    Hypertension Father    Heart disease Sister 25       MI   Pulmonary fibrosis Sister    Arthritis Brother    Heart disease Maternal Uncle    Stroke Maternal Uncle    Uterine cancer Paternal Grandmother    Stomach cancer Paternal Grandfather    Heart disease Maternal Uncle    Heart disease Maternal Uncle    Cancer Maternal Aunt        leukemia     Current Outpatient Medications (Cardiovascular):    atorvastatin (LIPITOR) 40 MG tablet, TAKE 1 TABLET (40 MG TOTAL) BY MOUTH DAILY. PT NEEDS OV FOR FURTHER REFILLS   carvedilol (COREG) 6.25 MG tablet, TAKE 1.5 TABLETS (9.375 MG TOTAL) BY MOUTH 2 (TWO) TIMES DAILY WITH A MEAL.   Torsemide 40 MG TABS, Take by mouth.   Current Outpatient Medications (Analgesics):    Acetaminophen 500 MG capsule, Take 1 capsule by mouth every 8 (eight) hours as needed.   HYDROcodone-acetaminophen (NORCO/VICODIN) 5-325 MG tablet, Take 1 tablet by mouth 2 (two) times daily. Per Dr. Dwyane Dee, patient is to take this three times a day for pain   Current Outpatient Medications (Other):    busPIRone (BUSPAR) 15 MG tablet, Take 1 tablet (15 mg total) by mouth 3 (three) times daily. Pt needs OV for further refills   Cholecalciferol 25 MCG (1000 UT) tablet, Take 1 tablet by mouth daily.   diclofenac Sodium (VOLTAREN) 1 % GEL, Apply 1 application topically 2 (two) times daily as needed. 2 grams to knees twice a day for pain   divalproex (DEPAKOTE SPRINKLE) 125 MG capsule, Take by  mouth.   gabapentin (NEURONTIN) 300 MG capsule, Take 1 capsule (300 mg total) by mouth at bedtime. (Patient taking differently: Take 100 mg by mouth 2 (two) times daily.)   Multiple Vitamin (MULTI-VITAMIN) tablet, Take 1 tablet by mouth daily.   ondansetron (ZOFRAN) 4 MG tablet, Take 4 mg by mouth every 6 (six) hours as needed for nausea or vomiting.   pantoprazole (PROTONIX) 40 MG tablet, TAKE  1 TABLET BY MOUTH EVERY DAY   pramipexole (MIRAPEX) 1 MG tablet, TAKE 1 TABLET BY MOUTH AT BEDTIME.   Sennosides-Docusate Sodium 8.6-50 MG CAPS, Take 1 tablet by mouth daily.   sertraline (ZOLOFT) 50 MG tablet, TAKE 1 AND 1/2 TABLETS DAILY BY MOUTH   traZODone (DESYREL) 50 MG tablet, Take by mouth.   Reviewed prior external information including notes and imaging from  primary care provider As well as notes that were available from care everywhere and other healthcare systems.  Past medical history, social, surgical and family history all reviewed in electronic medical record.  [name] pertanent information unless stated regarding to the chief complaint.   Review of Systems:  [name] headache, visual changes, nausea, vomiting, diarrhea, constipation, dizziness, abdominal pain, skin rash, fevers, chills, night sweats, weight loss, swollen lymph nodes,  chest pain, shortness of breath, mood changes. POSITIVE muscle aches, body aches, joint swelling  Objective  Blood pressure (!) 140/72, pulse 84, height 5\' 2"  (1.575 m), SpO2 96 %.   General: [name] apparent distress patient is alert but not oriented HEENT: Pupils equal, extraocular movements intact  Patient does have pitting edema of the lower extremities.  Patient is sitting in a wheelchair. Does have severe arthritic changes of the knees bilaterally.  Pain is out of proportion to the amount of palpation.  Tender to palpation diffusely on the knees.  Voluntary guarding noted.  After informed written and verbal consent, patient was seated on exam table. Right knee  was prepped with alcohol swab and utilizing anterolateral approach, patient's right knee space was injected with 4:1  marcaine 0.5%: Kenalog 40mg /dL. Patient tolerated the procedure well without immediate complications.  After informed written and verbal consent, patient was seated on exam table. Left knee was prepped with alcohol swab and utilizing anterolateral approach, patient's left knee space was injected with 4:1  marcaine 0.5%: Kenalog 40mg /dL. Patient tolerated the procedure well without immediate complications.   Impression and Recommendations:      The above documentation has been reviewed and is accurate and complete Lyndal Pulley, DO

## 2022-01-19 ENCOUNTER — Encounter: Payer: Self-pay | Admitting: Family Medicine

## 2022-01-19 ENCOUNTER — Ambulatory Visit (INDEPENDENT_AMBULATORY_CARE_PROVIDER_SITE_OTHER): Payer: Medicare Other | Admitting: Family Medicine

## 2022-01-19 DIAGNOSIS — M170 Bilateral primary osteoarthritis of knee: Secondary | ICD-10-CM

## 2022-01-19 NOTE — Assessment & Plan Note (Signed)
Repeat injection given again today.  Problem with worsening symptoms.  Social determinants of health includes patient's significant dementia that would not allow patient to make other decisions will be a surgical candidate.  At this point injections every 3 months seems to be more beneficial.

## 2022-01-19 NOTE — Patient Instructions (Addendum)
Injected both knees today See me in 3 months

## 2022-02-11 ENCOUNTER — Inpatient Hospital Stay (HOSPITAL_COMMUNITY): Payer: Medicare Other

## 2022-02-11 ENCOUNTER — Emergency Department (HOSPITAL_COMMUNITY): Payer: Medicare Other

## 2022-02-11 ENCOUNTER — Encounter (HOSPITAL_COMMUNITY): Payer: Self-pay

## 2022-02-11 ENCOUNTER — Inpatient Hospital Stay (HOSPITAL_COMMUNITY)
Admission: EM | Admit: 2022-02-11 | Discharge: 2022-02-13 | DRG: 682 | Disposition: A | Discharge status: Skilled Nursing Facility | Payer: Medicare Other | Source: Skilled Nursing Facility | Attending: Family Medicine | Admitting: Family Medicine

## 2022-02-11 DIAGNOSIS — N1832 Chronic kidney disease, stage 3b: Secondary | ICD-10-CM | POA: Diagnosis present

## 2022-02-11 DIAGNOSIS — E871 Hypo-osmolality and hyponatremia: Secondary | ICD-10-CM | POA: Diagnosis present

## 2022-02-11 DIAGNOSIS — Z515 Encounter for palliative care: Secondary | ICD-10-CM

## 2022-02-11 DIAGNOSIS — R4782 Fluency disorder in conditions classified elsewhere: Secondary | ICD-10-CM | POA: Diagnosis not present

## 2022-02-11 DIAGNOSIS — Z79899 Other long term (current) drug therapy: Secondary | ICD-10-CM

## 2022-02-11 DIAGNOSIS — J4489 Other specified chronic obstructive pulmonary disease: Secondary | ICD-10-CM | POA: Diagnosis present

## 2022-02-11 DIAGNOSIS — E875 Hyperkalemia: Secondary | ICD-10-CM | POA: Diagnosis present

## 2022-02-11 DIAGNOSIS — Z9049 Acquired absence of other specified parts of digestive tract: Secondary | ICD-10-CM

## 2022-02-11 DIAGNOSIS — I452 Bifascicular block: Secondary | ICD-10-CM | POA: Diagnosis present

## 2022-02-11 DIAGNOSIS — Z820 Family history of epilepsy and other diseases of the nervous system: Secondary | ICD-10-CM

## 2022-02-11 DIAGNOSIS — D509 Iron deficiency anemia, unspecified: Secondary | ICD-10-CM | POA: Diagnosis present

## 2022-02-11 DIAGNOSIS — G9341 Metabolic encephalopathy: Secondary | ICD-10-CM | POA: Diagnosis present

## 2022-02-11 DIAGNOSIS — D469 Myelodysplastic syndrome, unspecified: Secondary | ICD-10-CM | POA: Diagnosis present

## 2022-02-11 DIAGNOSIS — Z83438 Family history of other disorder of lipoprotein metabolism and other lipidemia: Secondary | ICD-10-CM

## 2022-02-11 DIAGNOSIS — E872 Acidosis, unspecified: Secondary | ICD-10-CM | POA: Diagnosis present

## 2022-02-11 DIAGNOSIS — R627 Adult failure to thrive: Secondary | ICD-10-CM | POA: Diagnosis present

## 2022-02-11 DIAGNOSIS — Z823 Family history of stroke: Secondary | ICD-10-CM

## 2022-02-11 DIAGNOSIS — Z6841 Body Mass Index (BMI) 40.0 and over, adult: Secondary | ICD-10-CM | POA: Diagnosis not present

## 2022-02-11 DIAGNOSIS — Z66 Do not resuscitate: Secondary | ICD-10-CM | POA: Diagnosis present

## 2022-02-11 DIAGNOSIS — E1140 Type 2 diabetes mellitus with diabetic neuropathy, unspecified: Secondary | ICD-10-CM | POA: Diagnosis present

## 2022-02-11 DIAGNOSIS — Z800 Family history of malignant neoplasm of digestive organs: Secondary | ICD-10-CM

## 2022-02-11 DIAGNOSIS — D696 Thrombocytopenia, unspecified: Secondary | ICD-10-CM | POA: Diagnosis present

## 2022-02-11 DIAGNOSIS — R001 Bradycardia, unspecified: Secondary | ICD-10-CM | POA: Diagnosis not present

## 2022-02-11 DIAGNOSIS — D7581 Myelofibrosis: Secondary | ICD-10-CM | POA: Diagnosis present

## 2022-02-11 DIAGNOSIS — G9349 Other encephalopathy: Secondary | ICD-10-CM | POA: Diagnosis present

## 2022-02-11 DIAGNOSIS — G2581 Restless legs syndrome: Secondary | ICD-10-CM | POA: Diagnosis present

## 2022-02-11 DIAGNOSIS — Z806 Family history of leukemia: Secondary | ICD-10-CM

## 2022-02-11 DIAGNOSIS — F32A Depression, unspecified: Secondary | ICD-10-CM | POA: Diagnosis present

## 2022-02-11 DIAGNOSIS — Z87891 Personal history of nicotine dependence: Secondary | ICD-10-CM

## 2022-02-11 DIAGNOSIS — R799 Abnormal finding of blood chemistry, unspecified: Secondary | ICD-10-CM

## 2022-02-11 DIAGNOSIS — M109 Gout, unspecified: Secondary | ICD-10-CM | POA: Diagnosis present

## 2022-02-11 DIAGNOSIS — R410 Disorientation, unspecified: Secondary | ICD-10-CM

## 2022-02-11 DIAGNOSIS — F0150 Vascular dementia without behavioral disturbance: Secondary | ICD-10-CM | POA: Diagnosis present

## 2022-02-11 DIAGNOSIS — Z8049 Family history of malignant neoplasm of other genital organs: Secondary | ICD-10-CM

## 2022-02-11 DIAGNOSIS — E669 Obesity, unspecified: Secondary | ICD-10-CM | POA: Diagnosis present

## 2022-02-11 DIAGNOSIS — G4733 Obstructive sleep apnea (adult) (pediatric): Secondary | ICD-10-CM | POA: Diagnosis present

## 2022-02-11 DIAGNOSIS — E1122 Type 2 diabetes mellitus with diabetic chronic kidney disease: Secondary | ICD-10-CM | POA: Diagnosis present

## 2022-02-11 DIAGNOSIS — D631 Anemia in chronic kidney disease: Secondary | ICD-10-CM | POA: Diagnosis present

## 2022-02-11 DIAGNOSIS — Z993 Dependence on wheelchair: Secondary | ICD-10-CM

## 2022-02-11 DIAGNOSIS — I130 Hypertensive heart and chronic kidney disease with heart failure and stage 1 through stage 4 chronic kidney disease, or unspecified chronic kidney disease: Secondary | ICD-10-CM | POA: Diagnosis present

## 2022-02-11 DIAGNOSIS — I69320 Aphasia following cerebral infarction: Secondary | ICD-10-CM | POA: Diagnosis not present

## 2022-02-11 DIAGNOSIS — E785 Hyperlipidemia, unspecified: Secondary | ICD-10-CM | POA: Diagnosis present

## 2022-02-11 DIAGNOSIS — Z781 Physical restraint status: Secondary | ICD-10-CM

## 2022-02-11 DIAGNOSIS — Z825 Family history of asthma and other chronic lower respiratory diseases: Secondary | ICD-10-CM

## 2022-02-11 DIAGNOSIS — G253 Myoclonus: Secondary | ICD-10-CM | POA: Diagnosis present

## 2022-02-11 DIAGNOSIS — Z8249 Family history of ischemic heart disease and other diseases of the circulatory system: Secondary | ICD-10-CM

## 2022-02-11 DIAGNOSIS — Z7189 Other specified counseling: Secondary | ICD-10-CM | POA: Diagnosis not present

## 2022-02-11 DIAGNOSIS — I878 Other specified disorders of veins: Secondary | ICD-10-CM | POA: Diagnosis present

## 2022-02-11 DIAGNOSIS — N179 Acute kidney failure, unspecified: Secondary | ICD-10-CM | POA: Diagnosis present

## 2022-02-11 DIAGNOSIS — I5032 Chronic diastolic (congestive) heart failure: Secondary | ICD-10-CM | POA: Diagnosis present

## 2022-02-11 DIAGNOSIS — K219 Gastro-esophageal reflux disease without esophagitis: Secondary | ICD-10-CM | POA: Diagnosis present

## 2022-02-11 DIAGNOSIS — Z8261 Family history of arthritis: Secondary | ICD-10-CM

## 2022-02-11 DIAGNOSIS — N19 Unspecified kidney failure: Secondary | ICD-10-CM

## 2022-02-11 DIAGNOSIS — G8929 Other chronic pain: Secondary | ICD-10-CM | POA: Diagnosis present

## 2022-02-11 LAB — URINALYSIS, ROUTINE W REFLEX MICROSCOPIC
Bilirubin Urine: NEGATIVE
Glucose, UA: NEGATIVE mg/dL
Hgb urine dipstick: NEGATIVE
Ketones, ur: NEGATIVE mg/dL
Leukocytes,Ua: NEGATIVE
Nitrite: NEGATIVE
Protein, ur: NEGATIVE mg/dL
Specific Gravity, Urine: 1.009 (ref 1.005–1.030)
pH: 5 (ref 5.0–8.0)

## 2022-02-11 LAB — COMPREHENSIVE METABOLIC PANEL
ALT: 56 U/L — ABNORMAL HIGH (ref 0–44)
AST: 46 U/L — ABNORMAL HIGH (ref 15–41)
Albumin: 3.5 g/dL (ref 3.5–5.0)
Alkaline Phosphatase: 115 U/L (ref 38–126)
Anion gap: 14 (ref 5–15)
BUN: 160 mg/dL — ABNORMAL HIGH (ref 8–23)
CO2: 14 mmol/L — ABNORMAL LOW (ref 22–32)
Calcium: 9.8 mg/dL (ref 8.9–10.3)
Chloride: 106 mmol/L (ref 98–111)
Creatinine, Ser: 3.89 mg/dL — ABNORMAL HIGH (ref 0.44–1.00)
GFR, Estimated: 11 mL/min — ABNORMAL LOW (ref >60)
Glucose, Bld: 141 mg/dL — ABNORMAL HIGH (ref 70–99)
Potassium: 6.9 mmol/L (ref 3.5–5.1)
Sodium: 134 mmol/L — ABNORMAL LOW (ref 135–145)
Total Bilirubin: 0.6 mg/dL (ref 0.3–1.2)
Total Protein: 6.1 g/dL — ABNORMAL LOW (ref 6.5–8.1)

## 2022-02-11 LAB — LACTIC ACID, PLASMA
Lactic Acid, Venous: 0.8 mmol/L (ref 0.5–1.9)
Lactic Acid, Venous: 0.6 mmol/L (ref 0.5–1.9)

## 2022-02-11 LAB — TROPONIN I (HIGH SENSITIVITY)
Troponin I (High Sensitivity): 20 ng/L — ABNORMAL HIGH (ref <18)
Troponin I (High Sensitivity): 21 ng/L — ABNORMAL HIGH (ref <18)

## 2022-02-11 LAB — I-STAT CHEM 8, ED
BUN: >130 mg/dL — ABNORMAL HIGH (ref 8–23)
Calcium, Ion: 1.27 mmol/L (ref 1.15–1.40)
Chloride: 106 mmol/L (ref 98–111)
Creatinine, Ser: 4.3 mg/dL — ABNORMAL HIGH (ref 0.44–1.00)
Glucose, Bld: 138 mg/dL — ABNORMAL HIGH (ref 70–99)
HCT: 28 % — ABNORMAL LOW (ref 36.0–46.0)
Hemoglobin: 9.5 g/dL — ABNORMAL LOW (ref 12.0–15.0)
Potassium: 6.6 mmol/L (ref 3.5–5.1)
Sodium: 131 mmol/L — ABNORMAL LOW (ref 135–145)
TCO2: 18 mmol/L — ABNORMAL LOW (ref 22–32)

## 2022-02-11 LAB — RENAL FUNCTION PANEL
Albumin: 3.5 g/dL (ref 3.5–5.0)
Anion gap: 12 (ref 5–15)
BUN: 158 mg/dL — ABNORMAL HIGH (ref 8–23)
CO2: 17 mmol/L — ABNORMAL LOW (ref 22–32)
Calcium: 9 mg/dL (ref 8.9–10.3)
Chloride: 108 mmol/L (ref 98–111)
Creatinine, Ser: 3.67 mg/dL — ABNORMAL HIGH (ref 0.44–1.00)
GFR, Estimated: 12 mL/min — ABNORMAL LOW (ref >60)
Glucose, Bld: 60 mg/dL — ABNORMAL LOW (ref 70–99)
Phosphorus: 7.7 mg/dL — ABNORMAL HIGH (ref 2.5–4.6)
Potassium: 5.8 mmol/L — ABNORMAL HIGH (ref 3.5–5.1)
Sodium: 137 mmol/L (ref 135–145)

## 2022-02-11 LAB — CBC WITH DIFFERENTIAL/PLATELET
Abs Immature Granulocytes: 0.35 10*3/uL — ABNORMAL HIGH (ref 0.00–0.07)
Basophils Absolute: 0 10*3/uL (ref 0.0–0.1)
Basophils Relative: 1 %
Eosinophils Absolute: 0 10*3/uL (ref 0.0–0.5)
Eosinophils Relative: 1 %
HCT: 30.7 % — ABNORMAL LOW (ref 36.0–46.0)
Hemoglobin: 9.5 g/dL — ABNORMAL LOW (ref 12.0–15.0)
Immature Granulocytes: 5 %
Lymphocytes Relative: 11 %
Lymphs Abs: 0.7 10*3/uL (ref 0.7–4.0)
MCH: 29.6 pg (ref 26.0–34.0)
MCHC: 30.9 g/dL (ref 30.0–36.0)
MCV: 95.6 fL (ref 80.0–100.0)
Monocytes Absolute: 0.5 10*3/uL (ref 0.1–1.0)
Monocytes Relative: 8 %
Neutro Abs: 5 10*3/uL (ref 1.7–7.7)
Neutrophils Relative %: 74 %
Platelets: 67 10*3/uL — ABNORMAL LOW (ref 150–400)
RBC: 3.21 MIL/uL — ABNORMAL LOW (ref 3.87–5.11)
RDW: 18.2 % — ABNORMAL HIGH (ref 11.5–15.5)
WBC: 6.6 10*3/uL (ref 4.0–10.5)
nRBC: 2.1 % — ABNORMAL HIGH (ref 0.0–0.2)

## 2022-02-11 LAB — HEMOGLOBIN A1C
Hgb A1c MFr Bld: 6.1 % — ABNORMAL HIGH (ref 4.8–5.6)
Mean Plasma Glucose: 128.37 mg/dL

## 2022-02-11 LAB — GLUCOSE, RANDOM: Glucose, Bld: 94 mg/dL (ref 70–99)

## 2022-02-11 LAB — SODIUM, URINE, RANDOM: Sodium, Ur: 60 mmol/L

## 2022-02-11 LAB — PROTIME-INR
INR: 1.2 (ref 0.8–1.2)
Prothrombin Time: 15.4 seconds — ABNORMAL HIGH (ref 11.4–15.2)

## 2022-02-11 LAB — CBG MONITORING, ED
Glucose-Capillary: 110 mg/dL — ABNORMAL HIGH (ref 70–99)
Glucose-Capillary: 127 mg/dL — ABNORMAL HIGH (ref 70–99)

## 2022-02-11 LAB — GLUCOSE, CAPILLARY
Glucose-Capillary: 106 mg/dL — ABNORMAL HIGH (ref 70–99)
Glucose-Capillary: 80 mg/dL (ref 70–99)
Glucose-Capillary: 81 mg/dL (ref 70–99)

## 2022-02-11 LAB — MAGNESIUM: Magnesium: 2 mg/dL (ref 1.7–2.4)

## 2022-02-11 MED ORDER — POLYETHYLENE GLYCOL 3350 17 G PO PACK: 17.0000 g | PACK | Freq: Every day | ORAL | Status: DC | PRN

## 2022-02-11 MED ORDER — HEPARIN (PORCINE) 2000 UNITS/L FOR CRRT: INTRAVENOUS_CENTRAL | Status: DC | PRN

## 2022-02-11 MED ORDER — ONDANSETRON HCL 4 MG/2ML IJ SOLN: 4.0000 mg | Freq: Once | INTRAMUSCULAR | Status: DC

## 2022-02-11 MED ORDER — PRISMASOL BGK 0/2.5 32-2.5 MEQ/L EC SOLN
Status: DC
  Filled 2022-02-11 (×3): qty 5000

## 2022-02-11 MED ORDER — SODIUM CHLORIDE 0.9 % IV SOLN
2.0000 g | Freq: Two times a day (BID) | INTRAVENOUS | Status: DC
  Administered 2022-02-12: 2 g via INTRAVENOUS
  Filled 2022-02-11: qty 12.5

## 2022-02-11 MED ORDER — DOCUSATE SODIUM 100 MG PO CAPS: 100.0000 mg | ORAL_CAPSULE | Freq: Two times a day (BID) | ORAL | Status: DC | PRN

## 2022-02-11 MED ORDER — INSULIN ASPART 100 UNIT/ML IJ SOLN: 0.0000 [IU] | INTRAMUSCULAR | Status: DC

## 2022-02-11 MED ORDER — DEXTROSE 50 % IV SOLN
1.0000 | Freq: Once | INTRAVENOUS | Status: AC
  Administered 2022-02-11: 50 mL via INTRAVENOUS
  Filled 2022-02-11: qty 50

## 2022-02-11 MED ORDER — HEPARIN SODIUM (PORCINE) 5000 UNIT/ML IJ SOLN
5000.0000 [IU] | Freq: Three times a day (TID) | INTRAMUSCULAR | Status: DC
  Administered 2022-02-11 – 2022-02-13 (×2): 5000 [IU] via SUBCUTANEOUS
  Filled 2022-02-11 (×2): qty 1

## 2022-02-11 MED ORDER — SODIUM CHLORIDE 0.9 % IV SOLN
250.0000 mL | INTRAVENOUS | Status: DC
  Administered 2022-02-11: 250 mL via INTRAVENOUS

## 2022-02-11 MED ORDER — HEPARIN SODIUM (PORCINE) 1000 UNIT/ML DIALYSIS
1000.0000 [IU] | INTRAMUSCULAR | Status: DC | PRN
  Administered 2022-02-12: 2400 [IU] via INTRAVENOUS_CENTRAL
  Filled 2022-02-11: qty 6
  Filled 2022-02-11: qty 3
  Filled 2022-02-11: qty 6

## 2022-02-11 MED ORDER — NOREPINEPHRINE 4 MG/250ML-% IV SOLN: 2.0000 ug/min | INTRAVENOUS | Status: DC

## 2022-02-11 MED ORDER — LIDOCAINE 5 % EX PTCH
1.0000 | MEDICATED_PATCH | CUTANEOUS | Status: DC
  Administered 2022-02-11 – 2022-02-12 (×2): 1 via TRANSDERMAL
  Filled 2022-02-11 (×2): qty 1

## 2022-02-11 MED ORDER — INSULIN ASPART 100 UNIT/ML IV SOLN
5.0000 [IU] | Freq: Once | INTRAVENOUS | Status: AC
  Administered 2022-02-11: 5 [IU] via INTRAVENOUS

## 2022-02-11 MED ORDER — PRISMASOL BGK 0/2.5 32-2.5 MEQ/L EC SOLN
Status: DC
  Filled 2022-02-11 (×10): qty 5000

## 2022-02-11 MED ORDER — SODIUM CHLORIDE 0.9 % IV BOLUS
500.0000 mL | Freq: Once | INTRAVENOUS | Status: AC
  Administered 2022-02-11: 500 mL via INTRAVENOUS

## 2022-02-11 MED ORDER — SODIUM ZIRCONIUM CYCLOSILICATE 10 G PO PACK
10.0000 g | PACK | Freq: Once | ORAL | Status: AC
  Administered 2022-02-11: 10 g via ORAL
  Filled 2022-02-11: qty 1

## 2022-02-11 MED ORDER — DEXTROSE 10 % IV SOLN: Freq: Once | INTRAVENOUS | Status: AC

## 2022-02-11 MED ORDER — SODIUM CHLORIDE 0.9 % IV SOLN
2.0000 g | Freq: Once | INTRAVENOUS | Status: AC
  Administered 2022-02-11: 2 g via INTRAVENOUS
  Filled 2022-02-11: qty 12.5

## 2022-02-11 MED ORDER — NOREPINEPHRINE 4 MG/250ML-% IV SOLN: 0.0000 ug/min | INTRAVENOUS | Status: DC

## 2022-02-11 MED ORDER — DIPHENHYDRAMINE HCL 50 MG/ML IJ SOLN
12.5000 mg | Freq: Once | INTRAMUSCULAR | Status: AC
  Administered 2022-02-12: 12.5 mg via INTRAVENOUS
  Filled 2022-02-11: qty 1

## 2022-02-11 MED ORDER — FUROSEMIDE 10 MG/ML IJ SOLN
60.0000 mg | Freq: Once | INTRAMUSCULAR | Status: AC
  Administered 2022-02-11: 60 mg via INTRAVENOUS
  Filled 2022-02-11: qty 6

## 2022-02-11 NOTE — Consult Note (Addendum)
[facility]phrology Consultation Note  Requesting MD: Dr. Ina Homes Reason for consult: AKI on CKD  HPI:  Erin [name] is a 76 y.o. female with history of hypertension, OSA on CPAP, diastolic CHF, myelofibrosis/pancytopenia, IDA, RLS, DM, left MCA stroke, cognition impairment, CHF followed by nephrologist in [facility], presents from SNF for altered mental status, bradycardia seen as a consultation for acute kidney injury and hyperkalemia. The patient lives at [facility] when the labs done on 11/8 showed creatinine level of 3.86, potassium of 6.9, CO2 17.  The patient becoming confused, lethargic and became bradycardic. The son noted that she has been progressively worsening for the last 4 to 5 days.  Last week she was able to walk with the help of walker.  Reportedly she was admitted to the hospital at [facility] in 12/2021 for similar presentation including AMS, AKI, hyperkalemia in the setting of pneumonia. For CKD, she follows with Dr. Olivia Mackie at [facility].  The CKD was thought to be due to hypertension and microvascular disease.  Baseline serum creatinine level seems to be around 1.5-1.7.  She was on torsemide for the management of chronic lower extremity edema. In the ER, the initial blood pressure 111/39, heart rate was around 43.  The labs showed sodium 131, potassium 6.6, BUN more than 1 at 30, creatinine level 4.30, hemoglobin 9.5, platelet count 67.  The chest x-ray was unremarkable. The patient was treated with dextrose, insulin, calcium, Lokelma and a dose of IV Lasix.  The patient is currently alert awake and oriented to her son but not really oriented to the place and unable to provide detailed history.  Her son was present at the bedside. The home medication does not include ACE inhibitor, ARB or ACE 2 inhibitor.  PMHx:   Past Medical History:  Diagnosis Date   Asthma    CHF (congestive heart failure) ([place])    hospital 11/2013-- [place] [facility]    Chronic kidney disease    Depression    Diabetes mellitus ([place])    GERD (gastroesophageal reflux disease)    Hyperlipidemia    Hypertension    Stroke [facility])     Past Surgical History:  Procedure Laterality Date   CHOLECYSTECTOMY  2002   LOOP RECORDER INSERTION N/A 08/01/2017   Procedure: LOOP RECORDER INSERTION;  Surgeon: Constance Haw, MD;  Location: [facility] [facility];  Service: Cardiovascular;  Laterality: N/A;   SPINE SURGERY  aug 2015   [facility]   TEE WITHOUT CARDIOVERSION N/A 08/01/2017   Procedure: TRANSESOPHAGEAL ECHOCARDIOGRAM (TEE);  Surgeon: Sanda Klein, MD;  [facility] END[facility]COPY;  Service: Cardiovascular;  Laterality: N/A;   TONSILLECTOMY     TUBAL LIGATION      Family Hx:  Family History  Problem Relation Age of Onset   Hypertension Mother    Alzheimer's disease Mother    Hyperlipidemia Mother    Heart disease Father        cad   Asthma Father    Cancer Father 59       leukemia   Stroke Father    Hypertension Father    Heart disease Sister 71       MI   Pulmonary fibrosis Sister    Arthritis Brother    Heart disease Maternal Uncle    Stroke Maternal Uncle    Uterine cancer Paternal Grandmother    Stomach cancer Paternal Grandfather    Heart disease Maternal Uncle    Heart disease  Maternal Uncle    Cancer Maternal Aunt        leukemia    Social History:  reports that she quit smoking about 37 years ago. Her smoking use included cigarettes. She has a 30.00 pack-year smoking history. She has never used smokeless tobacco. She reports that she does not drink alcohol and does not use drugs.  Allergies:  Allergies  Allergen Reactions   Bee Venom     Medications: Prior to Admission medications   Medication Sig Start Date End Date Taking? Authorizing Provider  Acetaminophen 500 MG capsule Take 1 capsule by mouth every 8 (eight) hours as needed. 09/14/20   [provider]  atorvastatin (LIPITOR) 40 MG tablet TAKE 1 TABLET  (40 MG TOTAL) BY MOUTH DAILY. PT NEEDS OV FOR FURTHER REFILLS 09/02/20   Carollee Herter, Kendrick Fries R, DO  busPIRone (BUSPAR) 15 MG tablet Take 1 tablet (15 mg total) by mouth 3 (three) times daily. Pt needs OV for further refills 06/25/20   Carollee Herter, Kendrick Fries R, DO  carvedilol (COREG) 6.25 MG tablet TAKE 1.5 TABLETS (9.375 MG TOTAL) BY MOUTH 2 (TWO) TIMES DAILY WITH A MEAL. 12/22/20   Lelon Perla, MD  Cholecalciferol 25 MCG (1000 UT) tablet Take 1 tablet by mouth daily.    [provider]  diclofenac Sodium (VOLTAREN) 1 % GEL Apply 1 application topically 2 (two) times daily as needed. 2 grams to knees twice a day for pain 12/09/20   [provider]  divalproex (DEPAKOTE SPRINKLE) 125 MG capsule Take by mouth.    [provider]  gabapentin (NEURONTIN) 300 MG capsule Take 1 capsule (300 mg total) by mouth at bedtime. Patient taking differently: Take 100 mg by mouth 2 (two) times daily. 08/26/20   Ann Held, DO  HYDROcodone-acetaminophen (NORCO/VICODIN) 5-325 MG tablet Take 1 tablet by mouth 2 (two) times daily. Per Dr. Dwyane Dee, patient is to take this three times a day for pain    [provider]  Multiple Vitamin (MULTI-VITAMIN) tablet Take 1 tablet by mouth daily.    [provider]  ondansetron (ZOFRAN) 4 MG tablet Take 4 mg by mouth every 6 (six) hours as needed for nausea or vomiting.    [provider]  pantoprazole (PROTONIX) 40 MG tablet TAKE 1 TABLET BY MOUTH EVERY DAY 10/13/20   Carollee Herter, Alferd Apa, DO  pramipexole (MIRAPEX) 1 MG tablet TAKE 1 TABLET BY MOUTH AT BEDTIME. 09/15/20   Carollee Herter, Alferd Apa, DO  Sennosides-Docusate Sodium 8.6-50 MG CAPS Take 1 tablet by mouth daily. 08/24/20   [provider]  sertraline (ZOLOFT) 50 MG tablet TAKE 1 AND 1/2 TABLETS DAILY BY MOUTH 11/27/20   Carollee Herter, Alferd Apa, DO  traZODone (DESYREL) 50 MG tablet Take by mouth.    [provider]    I have reviewed the patient's  current medications.  Labs: Renal Panel: Recent Labs    04/27/21 0000 08/25/21 0000 12/24/21 0000 02/11/22 1231  NA 136* 139 137 131*  K 3.7 4.2 5.0 6.6*  CL 106 101 107 106  CO2 19 26* 25*  --   GLUCOSE  --   --   --  138*  BUN 83* 70* 71* >130*  CREATININE 1.5* 1.6* 1.5* 4.30*  CALCIUM 8.8 8.9 9.0  --   ALBUMIN 3.6 4.1 4.0  --      CBC:    Latest Ref Rng & Units 02/11/2022   12:31 PM 02/11/2022   11:55  AM 12/24/2021   12:00 AM  CBC  WBC 4.0 - 10.5 K/uL  6.6  4.7      Hemoglobin 12.0 - 15.0 g/dL 9.5  9.5  9.7      Hematocrit 36.0 - 46.0 % 28.0  30.7  30      Platelets 150 - 400 K/uL  67  110         This result is from an external source.     Anemia Panel:  Recent Labs    04/27/21 0000 04/27/21 0947 04/27/21 1123 08/25/21 0000 12/24/21 0000 12/24/21 0910 02/11/22 1155 02/11/22 1231  HGB 8.3*  --   --  9.4* 9.7*  --  9.5* 9.5*  MCV 89  --   --   --   --   --  95.6  --   FERRITIN  --  98 108  --   --  263  --   --   TIBC  --  290 299  --   --  236*  --   --   IRON  --  54 61  --   --  69  --   --     No results for input(s): "AST", "ALT", "ALKPHOS", "BILITOT", "PROT", "ALBUMIN" in the last 168 hours.  Lab Results  Component Value Date   HGBA1C 6.3 08/26/2020    ROS: Patient is confused in the ER therefore unable to obtain review of system.  Physical Exam: Vitals:   02/11/22 1305 02/11/22 1330  BP: (!) 134/33 101/73  Pulse: (!) 45 (!) 40  Resp: 17 16  Temp:    SpO2: 97% 98%     General exam: Confused female lying on bed, not in distress Respiratory system: Distant breath sound. Respiratory effort normal. No wheezing or crackle Cardiovascular system: S1 & S2 heard, RRR.  Bilateral lower extremities pitting edema present, chronic in nature. Gastrointestinal system: Abdomen is nondistended, soft and nontender. Normal bowel sounds heard. Central nervous system: Alert, awake and oriented to person only.  Not oriented to the place Extremities:  Chronic leg edema present.  No cyanosis. Skin: No rashes, lesions or ulcers Psychiatry: Judgement and insight appear impaired,  Assessment/Plan:  #Acute kidney injury on CKD stage IIIb with hyperkalemia and volume overload: Exact etiology  of AKI unknown at this time.  Concerning for CHF exacerbation, ?  Infection.  Need to rule out bladder obstruction.  I will check UA, bladder scan, kidney ultrasound.  Given, hyperkalemia, bradycardia, volume overload and uremic encephalopathy, she will need renal replacement therapy.  BP is variable in the ER and now she is being admitted to ICU.  We will go ahead and start CRRT.  I have discussed this with the patient's son and ICU team.  Need to be cautious with anticoagulation because of myelofibrosis/thrombocytopenia.  We will use 2K bath and UF around 50 cc an hour net negative. Daily lab monitoring, strict ins and out.  #Hyperkalemia with bradycardia: Treated medically by EMS and in ER.  Heart rate is improving.  Starting dialysis with low potassium bath.  Monitor lab.  #Acute metabolic, uremic encephalopathy with superimposed chronic dementia: It seems like she has dementia and physical deconditioning after stroke.  As per her son, she is more confused and lethargic.  We will treat medically and CRRT to help with uremia.  #Metabolic acidosis: Follow-up CO2 level and manage with dialysis.  May need sodium bicarbonate.  #Hyponatremia, hypervolemic in the setting of CHF/fluid overload.  Managing with  CRRT with ultrafiltration.  Discussed with the ER provider, patient's son and ICU team. Thank you for the consult, we will continue to follow.   Altus Zaino Tanna Furry 02/11/2022, 1:57 PM  [facility].

## 2022-02-11 NOTE — Progress Notes (Signed)
Pharmacy Antibiotic Note  Erin [name] is a 76 y.o. female admitted on 02/11/2022 with  AMS and bradycardia .  Pharmacy has been consulted for cefepime dosing for UTI. AKI on CKD noted with SCr elevated at 4.3 (baseline 1.5-1.7), CRRT started 11/9.  9/13 UCx with >100K Serratia marcescens sensitive to ceftriaxone. She is afebrile, WBC are normal, and UCx is pending.  Plan: Cefepime 2 g IV q12h while on CRRT Monitor renal function, clinical progress, cultures/sensitivities F/U LOT and de-escalate as able   Height: 5\' 2"  (157.5 cm) Weight: 117.9 kg (260 lb) IBW/kg (Calculated) : 50.1  Temp (24hrs), Avg:97.8 F (36.6 C), Min:97.7 F (36.5 C), Max:97.9 F (36.6 C)  Recent Labs  Lab 02/11/22 1155 02/11/22 1231  WBC 6.6  --   CREATININE 3.89* 4.30*  LATICACIDVEN 0.8  --     Estimated Creatinine Clearance: 13.6 mL/min (A) (by C-G formula based on SCr of 4.3 mg/dL (H)).    Allergies  Allergen Reactions   Bee Venom     Thank you for involving pharmacy in this patient's care.  Renold Genta, PharmD, BCPS Clinical Pharmacist Clinical phone for 02/11/2022 until 10p is [phone] 02/11/2022 3:00 PM

## 2022-02-11 NOTE — Procedures (Signed)
Central Venous Catheter Insertion Procedure Note  NAYELLIE SANSEVERINO  [id number]  [birth year]  Date:02/11/22  Time:3:34 PM   Provider Performing:Ermin Parisien Loletha Grayer [name]   Procedure: Insertion of Non-tunneled Central Venous [id number]) with US guidance (03159)   Indication(s) Hemodialysis  Consent Risks of the procedure as well as the alternatives and risks of each were explained to the patient and/or caregiver.  Consent for the procedure was obtained and is signed in the bedside chart  Anesthesia Topical only with 1% lidocaine   Timeout Verified patient identification, verified procedure, site/side was marked, verified correct patient position, special equipment/implants available, medications/allergies/relevant history reviewed, required imaging and test results available.  Sterile Technique Maximal sterile technique including full sterile barrier drape, hand hygiene, sterile gown, sterile gloves, mask, hair covering, sterile ultrasound probe cover (if used).  Procedure Description Area of catheter insertion was cleaned with chlorhexidine and draped in sterile fashion.  With real-time ultrasound guidance a HD catheter was placed into the right femoral vein. Nonpulsatile blood flow and easy flushing noted in all ports.  The catheter was sutured in place and sterile dressing applied.  Complications/Tolerance None; patient tolerated the procedure well. Chest X-ray is ordered to verify placement for internal jugular or subclavian cannulation.   Chest x-ray is not ordered for femoral cannulation.  EBL Minimal  Specimen(s) None

## 2022-02-11 NOTE — Progress Notes (Signed)
[place] Progress Note Patient Name: ELLIANAH CORDY DOB: 06-04-1945 MRN: [id number]   Date of Service  02/11/2022  HPI/Events of Note  Multiple nursing concerns: 1. Patient oozing from fingerstick site done over an hour ago. Uremia, MDS/Thrombocytopenia and Heparin SQ will all effect the ability to clot. Now on CRRT. 2. Nursing concern about CO2 level. CO2 level = 17. Now on CRRT. Repeat CO2 pending for 5 AM. 3. Patient needs something for sleep. NPO. 4. Nursing request for CBC with platelets for the AM.  eICU Interventions  Plan: Hold Heparin [place] until patient re-evaluated in AM. Benadryl 12.5 mg IV X 1. CBC with platelets at 5 AM.     Intervention Category Major Interventions: Other:  Lysle Dingwall 02/11/2022, 10:36 PM

## 2022-02-11 NOTE — ED Triage Notes (Signed)
Labs done yesterday with K of 6.9. Here for AMS and bradycardia in the 30s. EMS gave 1gm Calcium and 2.5mg  Albuterol. Pt is A&Ox 1. Baseline of A&Ox4.

## 2022-02-11 NOTE — ED Notes (Signed)
Lab results reported to Nurse. 

## 2022-02-11 NOTE — H&P (Signed)
NAME:  Erin [name], MRN:  [id number], DOB:  1945/04/25, LOS: 0 ADMISSION DATE:  02/11/2022, CONSULTATION DATE:  02/11/22 REFERRING MD:  Mayra Neer - EM, CHIEF COMPLAINT:  bradycardia   History of Present Illness:  76 yo F PMH thrombocytopenia, dementia, myelofibrosis, CKD III, stroke with residual aphasia presented to ED 02/11/22 from [facility] home for evaluation of bradycardia. Associated AMS. Son notes that pt has become progressively more altered x 4-5 days.  Reportedly had labs checked yesterday and K was 6.9, Cr 3.86.   More altered and bradycardic today prompting EMS dispatch. Given Calcium chloride x1 and albuterol with EMS for known hyperkalemia.   In ED, K 6.6, Cr 4.3, BUN undetectably high.   ECG reveals bradycardic rhythm + RBBB, LAFB. Qtc 441. Received insulin + dextrose, lasix in ED.   Similar presentation to [facility] 12/16/21 -- AKI on CKD, hyperK, bradycardia. Supported medically, did not req dialysis. Tx for serratia marcescens UTI   PCCM consulted for admission   Pertinent  Medical History  Dementia Myelofibrosis  Stroke Aphasia  Chronic pain Thrombocytopenia CKD III DM2   Significant Hospital Events: Including procedures, antibiotic start and stop dates in addition to other pertinent events   11/9 ED from nursing home. Calcium, albuterol for known hyperK with EMS.   Interim History / Subjective:  Received 1g calcium, albuterol, 5units insulin + 1 amp d50, 60mg  lasix.   Objective   Blood pressure (!) 146/41, pulse (!) 49, temperature 97.7 F (36.5 C), temperature source Oral, resp. rate 17, height 5\' 2"  (1.575 m), weight 117.9 kg, SpO2 91 %.        Intake/Output Summary (Last 24 hours) at 02/11/2022 1443 Last data filed at 02/11/2022 1422 Gross per 24 hour  Intake 529.33 ml  Output --  Net 529.33 ml   Filed Weights   02/11/22 1132  Weight: 117.9 kg    Examination: General: Chronically and acutely ill appearing obese elderly F HENT: NCAT pink  mm  Lungs: Even unlabored  Cardiovascular: bradycardic. Cap refill < 3 sec Abdomen: obese soft ndnt  Extremities: BLE pitting edema  Neuro: Awake, lethargic. Intermittently interactive. Confused. Aphasic. Intermittent myoclonus.  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  -no white count or fever to suggest sepsis, no abd pain (though poor historian). Is brady, but SBP adequate.  P -minimize CNS depressing agents -delirium precautions  -RRT as below  -CMP is pending, if abnormal LFTs can add an NH4   Acute renal failure superimposed on CKD III Uremia NAGMA Hyperkalemia  Hyponatremia  P -plan for RRT -- likely CRRT in ICU -trend renal indices  -a CMP is pending  -strict I/O -avoid NSAIDs -renal US, UA, foley   Bradycardia due to electrolyte abnormalities RBBB LAFB - chronic, seen on ECG in 2019 HTN CHF -on torsemide outpt  P -cardiac monitoring -RRT as above  -holding diuretic for RRT   Myelofibrosis  Acute on Chronic thrombocytopenia Chronic anemia, with component of iron deficiency   P -follows outpt with heme onc  -follow CBC   DM2 with neuropathy -SSI -holding home gabapentin   Goals of care -d/w son at bedside -- Code status  is DNR. Full scope of medical tx otherwise.  -[place] resident  Best Practice (right click and "Reselect all SmartList Selections" daily)   Diet/type: NPO DVT prophylaxis: prophylactic heparin  GI prophylaxis: N/A Lines: N/A -- pending HD cath placement Foley:  N/A Code Status:  DNR Last date of multidisciplinary goals of care discussion [11/9 d/w son at bedside]  Labs   CBC: Recent Labs  Lab  02/11/22 1155 02/11/22 1231  WBC 6.6  --   NEUTROABS 5.0  --   HGB 9.5* 9.5*  HCT 30.7* 28.0*  MCV 95.6  --   PLT 67*  --     Basic Metabolic Panel: Recent Labs  Lab 02/11/22 1155 02/11/22 1231  NA 134* 131*  K 6.9* 6.6*  CL 106 106  CO2 14*  --   GLUCOSE 141* 138*  BUN 160* >130*  CREATININE 3.89* 4.30*  CALCIUM 9.8  --   MG 2.0  --    GFR: Estimated Creatinine Clearance: 13.6 mL/min (A) (by C-G formula based on SCr of 4.3 mg/dL (H)). Recent Labs  Lab 02/11/22 1155  WBC 6.6  LATICACIDVEN 0.8    Liver Function Tests: Recent Labs  Lab 02/11/22 1155  AST 46*  ALT 56*  ALKPHOS 115  BILITOT 0.6  PROT 6.1*  ALBUMIN 3.5   No results for input(s): "LIPASE", "AMYLASE" in the last 168 hours. No results for input(s): "AMMONIA" in the last 168 hours.  ABG    Component Value Date/Time   TCO2 18 (L) 02/11/2022 1231     Coagulation Profile: Recent Labs  Lab 02/11/22 1155  INR 1.2    Cardiac Enzymes: No results for input(s): "CKTOTAL", "CKMB", "CKMBINDEX", "TROPONINI" in the last 168 hours.  HbA1C: Hgb A1c MFr Bld  Date/Time Value Ref Range Status  08/26/2020 04:14 PM 6.3 4.6 - 6.5 % Final    Comment:    Glycemic Control Guidelines for People with Diabetes:Non Diabetic:  <6%Goal of Therapy: <7%Additional Action Suggested:  >8%   03/05/2020 10:35 AM 6.7 (H) 4.6 - 6.5 % Final    Comment:    Glycemic Control Guidelines for People with Diabetes:Non Diabetic:  <6%Goal of Therapy: <7%Additional Action Suggested:  >8%     CBG: Recent Labs  Lab 02/11/22 1337 02/11/22 1427  GLUCAP 110* 127*    Review of Systems:   Unable to reliably obtain due to encephalopathy   Past Medical History:  She,  has a past medical history of Asthma, CHF (congestive heart failure) ([place]), Chronic kidney disease, Depression, Diabetes mellitus ([place]), GERD (gastroesophageal reflux disease), Hyperlipidemia, Hypertension, and Stroke ([place]).   Surgical History:   Past Surgical  History:  Procedure Laterality Date   CHOLECYSTECTOMY  2002   LOOP RECORDER INSERTION N/A 08/01/2017   Procedure: LOOP RECORDER INSERTION;  Surgeon: Constance Haw, MD;  Location: [place] [facility];  Service: Cardiovascular;  Laterality: N/A;   SPINE SURGERY  aug 2015   [facility]   TEE WITHOUT CARDIOVERSION N/A 08/01/2017   Procedure: TRANSESOPHAGEAL ECHOCARDIOGRAM (TEE);  Surgeon: Sanda Klein, MD;  Location: [facility] ENDOSCOPY;  Service: Cardiovascular;  Laterality: N/A;   TONSILLECTOMY     TUBAL LIGATION       Social History:   reports that she quit smoking about 37 years ago. Her smoking use included cigarettes. She has a 30.00 pack-year smoking history. She has never used smokeless tobacco. She reports that she does not drink alcohol and does not use drugs.   Family History:  Her  family history includes Alzheimer's disease in her mother; Arthritis in her brother; Asthma in her father; Cancer in her maternal aunt; Cancer (age of onset: 27) in her father; Heart disease in her father, maternal uncle, maternal uncle, and maternal uncle; Heart disease (age of onset: 24) in her sister; Hyperlipidemia in her mother; Hypertension in her father and mother; Pulmonary fibrosis in her sister; Stomach cancer in her paternal grandfather; Stroke in her father and maternal uncle; Uterine cancer in her paternal grandmother.   Allergies Allergies  Allergen Reactions   Bee Venom      Home Medications  Prior to Admission medications   Medication Sig Start Date End Date Taking? Authorizing Provider  Acetaminophen 500 MG capsule Take 1 capsule by mouth every 8 (eight) hours as needed. 09/14/20   [provider]  atorvastatin (LIPITOR) 40 MG tablet TAKE 1 TABLET (40 MG TOTAL) BY MOUTH DAILY. PT NEEDS OV FOR FURTHER REFILLS 09/02/20   Carollee Herter, Kendrick Fries R, DO  busPIRone (BUSPAR) 15 MG tablet Take 1 tablet (15 mg total) by mouth 3 (three) times daily. Pt needs OV for further refills  06/25/20   Carollee Herter, Kendrick Fries R, DO  carvedilol (COREG) 6.25 MG tablet TAKE 1.5 TABLETS (9.375 MG TOTAL) BY MOUTH 2 (TWO) TIMES DAILY WITH A MEAL. 12/22/20   Lelon Perla, MD  Cholecalciferol 25 MCG (1000 UT) tablet Take 1 tablet by mouth daily.    [provider]  diclofenac Sodium (VOLTAREN) 1 % GEL Apply 1 application topically 2 (two) times daily as needed. 2 grams to knees twice a day for pain 12/09/20   [provider]  divalproex (DEPAKOTE SPRINKLE) 125 MG capsule Take by mouth.    [provider]  gabapentin (NEURONTIN) 300 MG capsule Take 1 capsule (300 mg total) by mouth at bedtime. Patient taking differently: Take 100 mg by mouth 2 (two) times daily. 08/26/20   Ann Held, DO  HYDROcodone-acetaminophen (NORCO/VICODIN) 5-325 MG tablet Take 1 tablet by mouth 2 (two) times daily. Per Dr. Dwyane Dee, patient is to take this three times a day for pain    [provider]  Multiple Vitamin (MULTI-VITAMIN) tablet Take 1 tablet by mouth daily.    [provider]  ondansetron (ZOFRAN) 4 MG tablet Take 4 mg by mouth every 6 (six) hours as needed for nausea or vomiting.    [provider]  pantoprazole (PROTONIX) 40 MG tablet TAKE 1 TABLET BY MOUTH EVERY DAY 10/13/20   Carollee Herter, Alferd Apa, DO  pramipexole (MIRAPEX) 1 MG tablet TAKE 1 TABLET BY MOUTH AT BEDTIME. 09/15/20   Carollee Herter, Alferd Apa, DO  Sennosides-Docusate Sodium 8.6-50 MG CAPS Take 1 tablet by mouth daily. 08/24/20   [provider]  sertraline (ZOLOFT) 50 MG tablet TAKE 1 AND 1/2 TABLETS DAILY BY MOUTH 11/27/20   Carollee Herter, Alferd Apa, DO  traZODone (DESYREL) 50 MG tablet Take by mouth.    [provider]     Critical care time: 43 minutes     CRITICAL CARE Performed by: Cristal Generous   Total critical care time: 43 minutes  Critical care time was exclusive of separately billable procedures and treating other patients. Critical care was necessary to  treat or prevent imminent or life-threatening deterioration.  Critical care was time spent personally by me on the following activities: development of treatment plan with patient and/or surrogate as well as nursing, discussions with consultants, evaluation of patient's response to treatment, examination of  patient, obtaining history from patient or surrogate, ordering and performing treatments and interventions, ordering and review of laboratory studies, ordering and review of radiographic studies, pulse oximetry and re-evaluation of patient's condition.  Eliseo Gum MSN, AGACNP-BC [place] for pager  02/11/2022, 2:43 PM

## 2022-02-11 NOTE — ED Provider Notes (Signed)
[facility] EMERGENCY DEPARTMENT Provider Note   CSN: [id number] Arrival date & time: 02/11/22  1129     History  Chief Complaint  Patient presents with   Bradycardia    Erin [name] is a 76 y.o. female with myelofibrosis, CKD, acute on chronic diastolic HF, IDA, RLS, COPD, T2DM, dementia, CHF, thrombocytopenia, cryptogenic stroke, OSA, gout presents with bradycardia, AMS.   Here for AMS and bradycardia in the 30s. Labs done yesterday at [place] demonstrated K of 6.9, Cr 3.86, Na 132, CO2 17, AG 22. CXR demonstrated central pulm venous congestion w/o frank pulm edema.  EMS gave 1gm Calcium chloride and 2.5mg  Albuterol. On my eval, patient is A&Ox1. She is noncontributory for history, Reportedly is normally A&Ox4.   Patient is able to state that she does not feel short of breath but feels very sleepy. When asked if she has chest pain she seems to indicate no. Further history unable to be obtained from patient.  Per chart review, patient was admitted at [facility] on 12/16/2021 for hyperkalemia, community-acquired pneumonia, diastolic heart failure, hypoxic respiratory failure, bradycardia.  Initially presented with altered mental status and bradycardia. Found to have pulm edema and PNA on CXR.   HPI     Home Medications Prior to Admission medications   Medication Sig Start Date End Date Taking? Authorizing Provider  Acetaminophen 500 MG capsule Take 1 capsule by mouth every 8 (eight) hours as needed. 09/14/20   [provider]  atorvastatin (LIPITOR) 40 MG tablet TAKE 1 TABLET (40 MG TOTAL) BY MOUTH DAILY. PT NEEDS OV FOR FURTHER REFILLS 09/02/20   Carollee Herter, Kendrick Fries R, DO  busPIRone (BUSPAR) 15 MG tablet Take 1 tablet (15 mg total) by mouth 3 (three) times daily. Pt needs OV for further refills 06/25/20   Carollee Herter, Kendrick Fries R, DO  carvedilol (COREG) 6.25 MG tablet TAKE 1.5 TABLETS (9.375 MG TOTAL) BY MOUTH 2 (TWO) TIMES DAILY  WITH A MEAL. 12/22/20   Lelon Perla, MD  Cholecalciferol 25 MCG (1000 UT) tablet Take 1 tablet by mouth daily.    [provider]  diclofenac Sodium (VOLTAREN) 1 % GEL Apply 1 application topically 2 (two) times daily as needed. 2 grams to knees twice a day for pain 12/09/20   [provider]  divalproex (DEPAKOTE SPRINKLE) 125 MG capsule Take by mouth.    [provider]  gabapentin (NEURONTIN) 300 MG capsule Take 1 capsule (300 mg total) by mouth at bedtime. Patient taking differently: Take 100 mg by mouth 2 (two) times daily. 08/26/20   Ann Held, DO  HYDROcodone-acetaminophen (NORCO/VICODIN) 5-325 MG tablet Take 1 tablet by mouth 2 (two) times daily. Per Dr. Dwyane Dee, patient is to take this three times a day for pain    [provider]  Multiple Vitamin (MULTI-VITAMIN) tablet Take 1 tablet by mouth daily.    [provider]  ondansetron (ZOFRAN) 4 MG tablet Take 4 mg by mouth every 6 (six) hours as needed for nausea or vomiting.    [provider]  pantoprazole (PROTONIX) 40 MG tablet TAKE 1 TABLET BY MOUTH EVERY DAY 10/13/20   Carollee Herter, Alferd Apa, DO  pramipexole (MIRAPEX) 1 MG tablet TAKE 1 TABLET BY MOUTH AT BEDTIME. 09/15/20   Carollee Herter, Alferd Apa, DO  Sennosides-Docusate Sodium 8.6-50 MG CAPS Take 1 tablet by mouth daily. 08/24/20   [provider]  sertraline (ZOLOFT) 50 MG tablet TAKE 1 AND  1/2 TABLETS DAILY BY MOUTH 11/27/20   Carollee Herter, Alferd Apa, DO  traZODone (DESYREL) 50 MG tablet Take by mouth.    [provider]      Allergies    Bee venom    Review of Systems   Review of Systems  Unable to perform ROS: Mental status change   Physical Exam Updated Vital Signs BP (!) 131/99   Pulse (!) 44   Temp 97.7 F (36.5 C) (Oral)   Resp 15   Ht 5\' 2"  (1.575 m)   Wt 117.9 kg   SpO2 97%   BMI 47.55 kg/m  Physical Exam General: ill-appearing female, lying in bed.  HEENT: PERRLA, Sclera  anicteric, MMM, trachea midline. NCAT. No c-spine stepoffs or tenderness.  Cardiology: RRR, no murmurs/rubs/gallops. BL radial and DP pulses equal bilaterally.  Resp: Normal respiratory rate and effort. CTAB, no wheezes, rhonchi, crackles.  Abd: Soft, non-tender, non-distended. No rebound tenderness or guarding.  GU: Deferred. MSK: No peripheral edema or signs of trauma. Extremities without deformity or TTP. No cyanosis or clubbing. Skin: warm, dry. No rashes or lesions. Back: No CVA tenderness Neuro: A&Ox1, CNs II-XII grossly intact. MAEs. Sensation grossly intact.  Psych: Sleepy, largely UTA  ED Results / Procedures / Treatments   Labs (all labs ordered are listed, but only abnormal results are displayed) Labs Reviewed  CBC WITH DIFFERENTIAL/PLATELET - Abnormal; Notable for the following components:   RBC 3.21 (*)    Hemoglobin 9.5 (*)    HCT 30.7 (*)    RDW 18.2 (*)    Platelets 67 (*)    nRBC 2.1 (*)    Abs Immature Granulocytes 0.35 (*)    All other components within normal limits  COMPREHENSIVE METABOLIC PANEL - Abnormal; Notable for the following components:   Sodium 134 (*)    Potassium 6.9 (*)    CO2 14 (*)    Glucose, Bld 141 (*)    BUN 160 (*)    Creatinine, Ser 3.89 (*)    Total Protein 6.1 (*)    AST 46 (*)    ALT 56 (*)    GFR, Estimated 11 (*)    All other components within normal limits  PROTIME-INR - Abnormal; Notable for the following components:   Prothrombin Time 15.4 (*)    All other components within normal limits  TROPONIN I (HIGH SENSITIVITY) - Abnormal; Notable for the following components:   Troponin I (High Sensitivity) 20 (*)    All other components within normal limits  TROPONIN I (HIGH SENSITIVITY) - Abnormal; Notable for the following components:   Troponin I (High Sensitivity) 21 (*)    All other components within normal limits    EKG EKG Interpretation  Date/Time:  Thursday February 11 2022 11:45:11 EST Ventricular Rate:  43 PR  Interval:  183 QRS Duration: 165 QT Interval:  521 QTC Calculation: 441 R Axis:   -71 Text Interpretation: Sinus bradycardia RBBB and LAFB Probable left ventricular hypertrophy Confirmed by Cindee Lame [phone]) on 02/11/2022 12:03:43 PM  Radiology CXR 1 View: COMPARISON:  December 16, 2021. FINDINGS: Stable cardiomegaly.  Lungs are clear.  Bony thorax is unremarkable.   IMPRESSION: No active disease.   Procedures .Critical Care  Performed by: Audley Hose, MD Authorized by: Audley Hose, MD   Critical care provider statement:    Critical care time (minutes):  40   Critical care was necessary to treat or prevent imminent or life-threatening deterioration of the following conditions:  Cardiac failure, metabolic crisis and renal failure   Critical care was time spent personally by me on the following activities:  Development of treatment plan with patient or surrogate, discussions with consultants, evaluation of patient's response to treatment, examination of patient, ordering and review of laboratory studies, ordering and review of radiographic studies, ordering and performing treatments and interventions, pulse oximetry, re-evaluation of patient's condition, review of old charts and obtaining history from patient or surrogate   Care discussed with: admitting provider       Medications Ordered in ED Medications  sodium chloride 0.9 % bolus 500 mL (0 mLs Intravenous Stopped 02/11/22 1422)  sodium zirconium cyclosilicate (LOKELMA) packet 10 g (10 g Oral Given 02/11/22 1342)  insulin aspart (novoLOG) injection 5 Units (5 Units Intravenous Given 02/11/22 1340)    And  dextrose 50 % solution 50 mL (50 mLs Intravenous Given 02/11/22 1339)  dextrose 10 % infusion (0 mLs Intravenous Stopped 02/11/22 1422)  furosemide (LASIX) injection 60 mg (60 mg Intravenous Given 02/11/22 1353)  ceFEPIme (MAXIPIME) 2 g in sodium chloride 0.9 % 100 mL IVPB (0 g Intravenous Stopped 02/11/22 1711)     ED Course/ Medical Decision Making/ A&P                          Medical Decision Making Amount and/or Complexity of Data Reviewed Labs: ordered. Decision-making details documented in ED Course. Radiology: ordered. Decision-making details documented in ED Course.  Risk OTC drugs. Prescription drug management. Decision regarding hospitalization.   Patient is bradycardic into 40s with BP in 130s. She is A&Ox1 and reports she is very sleepy.   Ddx of acute altered mental status or encephalopathy considered but not limited to: -Intracranial abnormalities such as ICH, hydrocephalus, head trauma - no e/o head trauma on exam.  -Infection such as UTI, PNA, or meningitis - patient is afebrile at this time. If no other cause of encephalopathy could be found will consider LP.  -Toxic ingestion such as opioid overdose, anticholinergic toxicity -Electrolyte abnormalities or hyper/hypoglycemia -Hypercarbia or hypoxia -Hepatic encephalopathy or uremia -ACS or arrhythmia -Endocrine abnormality such as thyroid storm or myxedema coma   EKG does not demonstrate significantly peaked T waves. Sinus bradycardia w/ bifascicular block but no PR prolongation or nodal blockade noted. No signs of ischemia.   Labs significant for: Baseline renal function reportedly 1.3-1.6, was elevated to 2.6 on prior admission in September, and yesterday it was 3.6. Today Cr is 3.89. BUN >130. In s/o AMS, consider AKI/electrolyte abnormalities/uremic encephalopathy high on ddx for cause of a metabolic encephalopathy.  CO2 decreased to 14 w/ NAGMA. AST only slightly elevated 46/56.  Glucose 110, potassium 6.6, sodium 131.  Troponin 20-21, hemoglobin 9.5, WBC 6.6, lactic acid 0.8. CXR wnl. Given broad spectrum abx to cover for possible infectious causes, urine is pending. Consider BRASH syndrome w/ bradycardia and AKI, hyperkalemia.  I have personally reviewed and interpreted all labs and imaging.   For further updates,  please see ED course:  ED Course:  Clinical Course as of 02/13/22 1532  Thu Feb 11, 2022  1231 DG Chest Portable 1 View No active disease [HN]  1322 BUN(!): >130 [HN]  1322 Creatinine(!): 4.30 [HN]  1322 Consulted to nephrology [HN]  1323 Potassium(!!): 6.6 Started hyperkalemia protocol [HN]  1323 TCO2(!): 18 [HN]  1323 Glucose(!): 138 [HN]  1323 Platelets(!): 67 Has known thrombocytopenia [HN]  4 Son at bedside now. Stating that patient is slightly confused  at baseline but not as much as this. [HN]  7737 Nephrology will come see patient. Consulted to critical care.  [HN]    Clinical Course User Index [HN] Audley Hose, MD   Patient is given hyperkalemia protocol including calcium, lasix, insulin/dextrose, and lokelma. Maintaining stable blood pressure though still bradycardic into 40s.   Admit: Admitted to ICU w/ nephrology following         Final Clinical Impression(s) / ED Diagnoses Final diagnoses:  Hyperkalemia  Bradycardia  Elevated BUN  AKI (acute kidney injury) ([place])  Confusion  Metabolic acidosis, normal anion gap (NAG)    Rx / DC Orders ED Discharge Orders     None        This note was created using dictation software, which may contain spelling or grammatical errors.    Audley Hose, MD 03/06/22 [phone]

## 2022-02-11 NOTE — Progress Notes (Incomplete)
Attempted to obtain MRSA swab at 2100. Patient became agitated, will attempt again at a later time.

## 2022-02-12 ENCOUNTER — Inpatient Hospital Stay (HOSPITAL_COMMUNITY): Payer: Medicare Other

## 2022-02-12 DIAGNOSIS — N179 Acute kidney failure, unspecified: Secondary | ICD-10-CM | POA: Diagnosis not present

## 2022-02-12 DIAGNOSIS — Z66 Do not resuscitate: Secondary | ICD-10-CM | POA: Diagnosis not present

## 2022-02-12 DIAGNOSIS — G9341 Metabolic encephalopathy: Secondary | ICD-10-CM | POA: Diagnosis not present

## 2022-02-12 DIAGNOSIS — Z7189 Other specified counseling: Secondary | ICD-10-CM

## 2022-02-12 DIAGNOSIS — Z515 Encounter for palliative care: Secondary | ICD-10-CM

## 2022-02-12 LAB — CBC WITH DIFFERENTIAL/PLATELET
Abs Immature Granulocytes: 0.3 10*3/uL — ABNORMAL HIGH (ref 0.00–0.07)
Basophils Absolute: 0 10*3/uL (ref 0.0–0.1)
Basophils Relative: 0 %
Eosinophils Absolute: 0 10*3/uL (ref 0.0–0.5)
Eosinophils Relative: 0 %
HCT: 26.7 % — ABNORMAL LOW (ref 36.0–46.0)
Hemoglobin: 8.6 g/dL — ABNORMAL LOW (ref 12.0–15.0)
Lymphocytes Relative: 17 %
Lymphs Abs: 0.7 10*3/uL (ref 0.7–4.0)
MCH: 29.8 pg (ref 26.0–34.0)
MCHC: 32.2 g/dL (ref 30.0–36.0)
MCV: 92.4 fL (ref 80.0–100.0)
Metamyelocytes Relative: 3 %
Monocytes Absolute: 0.1 10*3/uL (ref 0.1–1.0)
Monocytes Relative: 3 %
Myelocytes: 2 %
Neutro Abs: 3 10*3/uL (ref 1.7–7.7)
Neutrophils Relative %: 73 %
Platelets: 47 10*3/uL — ABNORMAL LOW (ref 150–400)
Promyelocytes Relative: 2 %
RBC: 2.89 MIL/uL — ABNORMAL LOW (ref 3.87–5.11)
RDW: 18 % — ABNORMAL HIGH (ref 11.5–15.5)
WBC: 4.1 10*3/uL (ref 4.0–10.5)
nRBC: 1 /100 WBC — ABNORMAL HIGH
nRBC: 1.9 % — ABNORMAL HIGH (ref 0.0–0.2)

## 2022-02-12 LAB — GLUCOSE, CAPILLARY
Glucose-Capillary: 76 mg/dL (ref 70–99)
Glucose-Capillary: 90 mg/dL (ref 70–99)
Glucose-Capillary: 101 mg/dL — ABNORMAL HIGH (ref 70–99)
Glucose-Capillary: 92 mg/dL (ref 70–99)
Glucose-Capillary: 117 mg/dL — ABNORMAL HIGH (ref 70–99)
Glucose-Capillary: 100 mg/dL — ABNORMAL HIGH (ref 70–99)

## 2022-02-12 LAB — BASIC METABOLIC PANEL
Anion gap: 9 (ref 5–15)
BUN: 70 mg/dL — ABNORMAL HIGH (ref 8–23)
CO2: 24 mmol/L (ref 22–32)
Calcium: 8 mg/dL — ABNORMAL LOW (ref 8.9–10.3)
Chloride: 106 mmol/L (ref 98–111)
Creatinine, Ser: 1.43 mg/dL — ABNORMAL HIGH (ref 0.44–1.00)
GFR, Estimated: 38 mL/min — ABNORMAL LOW (ref >60)
Glucose, Bld: 118 mg/dL — ABNORMAL HIGH (ref 70–99)
Potassium: 4.1 mmol/L (ref 3.5–5.1)
Sodium: 139 mmol/L (ref 135–145)

## 2022-02-12 LAB — RENAL FUNCTION PANEL
Albumin: 3.1 g/dL — ABNORMAL LOW (ref 3.5–5.0)
Albumin: 2.8 g/dL — ABNORMAL LOW (ref 3.5–5.0)
Anion gap: 10 (ref 5–15)
Anion gap: 6 (ref 5–15)
BUN: 90 mg/dL — ABNORMAL HIGH (ref 8–23)
BUN: 43 mg/dL — ABNORMAL HIGH (ref 8–23)
CO2: 20 mmol/L — ABNORMAL LOW (ref 22–32)
CO2: 26 mmol/L (ref 22–32)
Calcium: 8 mg/dL — ABNORMAL LOW (ref 8.9–10.3)
Calcium: 7.9 mg/dL — ABNORMAL LOW (ref 8.9–10.3)
Chloride: 105 mmol/L (ref 98–111)
Chloride: 108 mmol/L (ref 98–111)
Creatinine, Ser: 1.77 mg/dL — ABNORMAL HIGH (ref 0.44–1.00)
Creatinine, Ser: 0.97 mg/dL (ref 0.44–1.00)
GFR, Estimated: 29 mL/min — ABNORMAL LOW (ref >60)
GFR, Estimated: >60 mL/min (ref >60)
Glucose, Bld: 86 mg/dL (ref 70–99)
Glucose, Bld: 105 mg/dL — ABNORMAL HIGH (ref 70–99)
Phosphorus: 4 mg/dL (ref 2.5–4.6)
Phosphorus: 2.2 mg/dL — ABNORMAL LOW (ref 2.5–4.6)
Potassium: 3.8 mmol/L (ref 3.5–5.1)
Potassium: 4 mmol/L (ref 3.5–5.1)
Sodium: 135 mmol/L (ref 135–145)
Sodium: 140 mmol/L (ref 135–145)

## 2022-02-12 LAB — MRSA NEXT GEN BY PCR, NASAL: MRSA by PCR Next Gen: DETECTED — AB

## 2022-02-12 LAB — URINE CULTURE: Culture: NO GROWTH

## 2022-02-12 LAB — OSMOLALITY, URINE: Osmolality, Ur: 358 mOsm/kg (ref 300–900)

## 2022-02-12 LAB — MAGNESIUM: Magnesium: 2.1 mg/dL (ref 1.7–2.4)

## 2022-02-12 MED ORDER — ORAL CARE MOUTH RINSE: 15.0000 mL | OROMUCOSAL | Status: DC | PRN

## 2022-02-12 MED ORDER — CHLORHEXIDINE GLUCONATE CLOTH 2 % EX PADS
6.0000 | MEDICATED_PAD | Freq: Every day | CUTANEOUS | Status: DC
  Administered 2022-02-12: 6 via TOPICAL

## 2022-02-12 MED ORDER — BUSPIRONE HCL 15 MG PO TABS: 7.5000 mg | ORAL_TABLET | Freq: Once | ORAL | Status: DC

## 2022-02-12 MED ORDER — MIDAZOLAM HCL 2 MG/2ML IJ SOLN
INTRAMUSCULAR | Status: AC
  Filled 2022-02-12: qty 2

## 2022-02-12 MED ORDER — OXYCODONE HCL 5 MG PO TABS
5.0000 mg | ORAL_TABLET | Freq: Four times a day (QID) | ORAL | Status: DC
  Administered 2022-02-12 – 2022-02-13 (×4): 5 mg via ORAL
  Filled 2022-02-12 (×4): qty 1

## 2022-02-12 MED ORDER — DEXMEDETOMIDINE HCL IN NACL 400 MCG/100ML IV SOLN: 0.4000 ug/kg/h | INTRAVENOUS | Status: DC

## 2022-02-12 MED ORDER — ETOMIDATE 2 MG/ML IV SOLN
INTRAVENOUS | Status: AC
  Filled 2022-02-12: qty 10

## 2022-02-12 MED ORDER — GABAPENTIN 100 MG PO CAPS
100.0000 mg | ORAL_CAPSULE | Freq: Two times a day (BID) | ORAL | Status: DC
  Administered 2022-02-12 – 2022-02-13 (×2): 100 mg via ORAL
  Filled 2022-02-12 (×2): qty 1

## 2022-02-12 MED ORDER — PRISMASOL BGK 4/2.5 32-4-2.5 MEQ/L REPLACEMENT SOLN
Status: DC
  Filled 2022-02-12 (×4): qty 5000

## 2022-02-12 MED ORDER — PRISMASOL BGK 4/2.5 32-4-2.5 MEQ/L EC SOLN
Status: DC
  Filled 2022-02-12 (×14): qty 5000

## 2022-02-12 MED ORDER — TRAZODONE HCL 50 MG PO TABS: 50.0000 mg | ORAL_TABLET | Freq: Every day | ORAL | Status: DC

## 2022-02-12 MED ORDER — SERTRALINE HCL 50 MG PO TABS: 75.0000 mg | ORAL_TABLET | Freq: Every day | ORAL | Status: DC

## 2022-02-12 MED ORDER — BUSPIRONE HCL 5 MG PO TABS
15.0000 mg | ORAL_TABLET | Freq: Three times a day (TID) | ORAL | Status: DC
  Administered 2022-02-12 – 2022-02-13 (×3): 15 mg via ORAL
  Filled 2022-02-12 (×3): qty 1

## 2022-02-12 MED ORDER — MUPIROCIN 2 % EX OINT
1.0000 | TOPICAL_OINTMENT | Freq: Two times a day (BID) | CUTANEOUS | Status: DC
  Administered 2022-02-13: 1 via NASAL
  Filled 2022-02-12: qty 22

## 2022-02-12 MED ORDER — MIDAZOLAM HCL 2 MG/2ML IJ SOLN
1.0000 mg | Freq: Once | INTRAMUSCULAR | Status: AC
  Administered 2022-02-12: 1 mg via INTRAVENOUS

## 2022-02-12 MED ORDER — PRAMIPEXOLE DIHYDROCHLORIDE 1 MG PO TABS
1.0000 mg | ORAL_TABLET | Freq: Every day | ORAL | Status: DC
  Filled 2022-02-12 (×2): qty 1

## 2022-02-12 MED ORDER — DICLOFENAC SODIUM 1 % EX GEL: 4.0000 g | Freq: Four times a day (QID) | CUTANEOUS | Status: DC | PRN

## 2022-02-12 MED ORDER — DULOXETINE HCL 30 MG PO CPEP
30.0000 mg | ORAL_CAPSULE | Freq: Every day | ORAL | Status: DC
  Administered 2022-02-12 – 2022-02-13 (×2): 30 mg via ORAL
  Filled 2022-02-12 (×2): qty 1

## 2022-02-12 MED ORDER — CHLORHEXIDINE GLUCONATE CLOTH 2 % EX PADS
6.0000 | MEDICATED_PAD | Freq: Every day | CUTANEOUS | Status: DC
  Administered 2022-02-13: 6 via TOPICAL

## 2022-02-12 NOTE — Progress Notes (Signed)
Patient still yelling out "momma help me" with a PAINAD score of 8. Notified eLink.   Lesle Reek, RN

## 2022-02-12 NOTE — Progress Notes (Signed)
Patient became agitated and was swatting and hitting staff members during IV dressing change. Calmed down after med admin and lights being turned off. See MAR.   Lesle Reek, RN

## 2022-02-12 NOTE — Progress Notes (Signed)
[facility] Progress Note Patient Name: Erin [name] DOB: 1946-03-08 MRN: [id number]   Date of Service  02/12/2022  HPI/Events of Note  Multiple issues: 1. Agitation - Nursing reports QTc interval = 0.505 seconds. Therefore, can't use Haldol. Patient is on Buspar at home.  2. Nursing request for Tylenol for pain even though the patient does not c/o pain.  AST and ALT are both elevated. Will avoid Tylenol.   eICU Interventions  Plan: Buspar 7.5 mg PO X 1.     Intervention Category Major Interventions: Other:  Lysle Dingwall 02/12/2022, 5:49 AM

## 2022-02-12 NOTE — Progress Notes (Signed)
Patient attempting to pull out foley and broke the seal, PIV, HD cath, and nasal cannula. Patient hitting staff members, complaining of pain, and agitation persistently getting worse. eLink notified and camera'd in. Orders received and placed in bilateral soft wrist restraints and mittens. See MAR for medication administration.   Lesle Reek, RN

## 2022-02-12 NOTE — Progress Notes (Addendum)
   NAME:  Erin [name], MRN:  [id number], DOB:  [birth year], LOS: 1 ADMISSION DATE:  02/11/2022, CONSULTATION DATE:  02/11/22 REFERRING MD:  Mayra Neer - EM, CHIEF COMPLAINT:  bradycardia   History of Present Illness:  76 yo F PMH thrombocytopenia, dementia, myelofibrosis, CKD III, stroke with residual aphasia presented to ED 02/11/22 from [place] for evaluation of bradycardia. Associated AMS. Son notes that pt has become progressively more altered x 4-5 days.  Reportedly had labs checked yesterday and K was 6.9, Cr 3.86.   More altered and bradycardic today prompting EMS dispatch. Given Calcium chloride x1 and albuterol with EMS for known hyperkalemia.   In ED, K 6.6, Cr 4.3, BUN undetectably high.   ECG reveals bradycardic rhythm + RBBB, LAFB. Qtc 441. Received insulin + dextrose, lasix in ED.   Similar presentation to [place] 12/16/21 -- AKI on CKD, hyperK, bradycardia. Supported medically, did not req dialysis. Tx for serratia marcescens UTI   PCCM consulted for admission   Pertinent  Medical History  Dementia Myelofibrosis  Stroke Aphasia  Chronic pain Thrombocytopenia CKD III DM2   Significant Hospital Events: Including procedures, antibiotic start and stop dates in addition to other pertinent events   11/9 ED from nursing home. Calcium, albuterol for known hyperK with EMS.   Interim History / Subjective:  Intermittent agitation overnight.  Wants her home pain meds.  Objective   Blood pressure 99/81, pulse 87, temperature 97.6 F (36.4 C), temperature source Oral, resp. rate 16, height 5\' 2"  (1.575 m), weight 102.1 kg, SpO2 (!) 87 %.        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 Hx prior CVA, resultant aphasia Acute renal failure superimposed on CKD III with hyperkalemia and uremia Bradycardia due to electrolyte abnormalities- resolved Volume overloaded state of heart DM2 with neuropathy, chronic pain FTT, multiple near end stage comorbidities Myelofibrosis  - CRRT today until filter clots - UA neg, DC abx - Restart home pain meds - Precedex PRN - PMT to discuss GOC with family, patient may be interested in hospice and doubt she would want HD long term  Best Practice (right click and "Reselect all SmartList Selections" daily)   Diet/type: renal DVT prophylaxis: prophylactic heparin  GI prophylaxis: N/A Lines: Femoral HD cath (too claustrophobic for IJ) Foley:  N/A Code Status:  DNR Last date of multidisciplinary goals of care discussion [11/9 d/w son at bedside]  33 min cc time Erskine Emery MD PCCM

## 2022-02-12 NOTE — Progress Notes (Signed)
Patient in bed screaming for her momma and help. Attempted to console patient with no success. [place] RN notified.   Lesle Reek, RN

## 2022-02-12 NOTE — Progress Notes (Addendum)
[facility] Progress Note   76 y.o. female with HTN, OSA on CPAP, dCHF, myelofibrosis/pancytopenia, RLS, DM, left MCA stroke, cognition impairment, CHF followed by nephrologist in [facility], p/w AMS from SNF, bradycardia seen as a consultation for acute kidney injury and hyperkalemia. [facility] w/ labs done on 11/8 showing cr 3.86, K 6.9, CO2 17.  The patient becoming confused, lethargic and became bradycardic. The son noted that she has been progressively worsening for the last 4 to 5 days.  For CKD, she follows with Dr. Olivia Mackie at [facility].  The CKD was thought to be due to hypertension and microvascular disease.  Baseline serum creatinine level seems to be around 1.5-1.7.  She was on torsemide for the management of chronic lower extremity edema. Home medication does not include ACE inhibitor, ARB or ACE 2 inhibitor.    Assessment/ Plan:   1) Acute kidney injury on CKD stage IIIb with hyperkalemia and volume overload: Exact etiology  of AKI unknown at this time.  Concerning for CHF exacerbation, ?  Infection.  Need to rule out bladder obstruction.  I will check UA, bladder scan, kidney ultrasound.  Given, hyperkalemia, bradycardia, volume overload and uremic encephalopathy, started on CRRT 11/9. Need to be cautious with anticoagulation because of myelofibrosis/thrombocytopenia.  Initially 2K bath and UF around 50 cc an hour net negative.   Seen on CRRT; this am  baths to 4K. Interesting the UOP is 2.6L after Lasix 60 x1 . If filter clots or there's issues w/ the rt fem temp let's hold CRRT and follow closely.   Daily lab monitoring, strict ins and out.   2) Hyperkalemia with bradycardia: Treated medically by EMS and in ER.  Heart rate is improving.  Started dialysis with low potassium bath -> 4K am of 11/10.  Monitor lab.   3) Acute metabolic, uremic encephalopathy with superimposed chronic dementia: It seems like she has dementia and physical deconditioning after stroke.  As  per her son, she is more confused and lethargic.  We will treat medically and CRRT to help with uremia.   4) Metabolic acidosis: Follow-up CO2 level and manage with dialysis.  May need sodium bicarbonate.   5) Hyponatremia, hypervolemic in the setting of CHF/fluid overload.  Managing with CRRT with ultrafiltration.  Subjective:   Confused, trying to pull out lines but alert.   Objective:   BP 99/81   Pulse 87   Temp 97.6 F (36.4 C) (Oral)   Resp 16   Ht 5\' 2"  (1.575 m)   Wt 102.1 kg   SpO2 (!) 87%   BMI 41.17 kg/m   Intake/Output Summary (Last 24 hours) at 02/12/2022 9678 Last data filed at 02/12/2022 0700 Gross per 24 hour  Intake 828.43 ml  Output 2606 ml  Net -1777.57 ml   Weight change:   Physical Exam: General exam: Confused female lying on bed, not in distress Respiratory system: Distant breath sound but no rales Cardiovascular system: S1 & S2 heard, RRR.  1+ lower extremities pitting edema present, chronic in nature. Gastrointestinal system: Abdomen is nondistended, soft and nontender.  Central nervous system: Alert, awake and oriented to person only.  Not oriented to the place Extremities: Chronic leg edema present.  No cyanosis. Skin: No rashes, lesions or ulcers Psychiatry: Judgement and insight appear impaired Access: Rt fem temp cath  Imaging: DG Chest Portable 1 View  Result Date: 02/11/2022 CLINICAL DATA:  Bradycardia. EXAM: PORTABLE CHEST 1 VIEW COMPARISON:  December 16, 2021. FINDINGS: Stable  cardiomegaly.  Lungs are clear.  Bony thorax is unremarkable. IMPRESSION: No active disease. Electronically Signed   By: Marijo Conception M.D.   On: 02/11/2022 12:27    Labs: BMET Recent Labs  Lab 02/11/22 1155 02/11/22 1231 02/11/22 1531 02/11/22 2135 02/12/22 0356  NA 134* 131* 137  --  135  K 6.9* 6.6* 5.8*  --  3.8  CL 106 106 108  --  105  CO2 14*  --  17*  --  20*  GLUCOSE 141* 138* 60* 94 86  BUN 160* >130* 158*  --  90*  CREATININE 3.89* 4.30*  3.67*  --  1.77*  CALCIUM 9.8  --  9.0  --  8.0*  PHOS  --   --  7.7*  --  4.0   CBC Recent Labs  Lab 02/11/22 1155 02/11/22 1231 02/12/22 0356  WBC 6.6  --  4.1  NEUTROABS 5.0  --  3.0  HGB 9.5* 9.5* 8.6*  HCT 30.7* 28.0* 26.7*  MCV 95.6  --  92.4  PLT 67*  --  47*    Medications:     busPIRone  15 mg Oral TID   Chlorhexidine Gluconate Cloth  6 each Topical Daily   DULoxetine  30 mg Oral Daily   gabapentin  100 mg Oral BID   heparin  5,000 Units Subcutaneous Q8H   insulin aspart  0-9 Units Subcutaneous Q4H   lidocaine  1 patch Transdermal Q24H   oxyCODONE  5 mg Oral Q6H WA   pramipexole  1 mg Oral QHS   sertraline  75 mg Oral QHS   traZODone  50 mg Oral QHS      Otelia Santee, MD 02/12/2022, 8:23 AM

## 2022-02-12 NOTE — Consult Note (Signed)
Consultation Note Date: 02/12/2022   Patient Name: Erin [name]  DOB: 08-Apr-1945  MRN: [id number]  Age / Sex: 76 y.o., female  PCP: Donnajean Lopes, MD Referring Physician: Candee Furbish, MD  Reason for Consultation: Establishing goals of care  HPI/Patient Profile: 76 y.o. female  with past medical history of  thrombocytopenia, dementia, myelofibrosis, CKD III, and stroke with residual aphasia admitted on 02/11/2022 with AMS and AKI. Requiring CRRT. PMT consulted to discuss [place].    Clinical Assessment and Goals of Care: I have reviewed medical records including EPIC notes, labs and imaging, received report from RN, assessed the patient and then met with patient's daughter Janet/HCPOA  to discuss diagnosis prognosis, GOC, EOL wishes, disposition and options.  I introduced Palliative Medicine as specialized medical care for people living with serious illness. It focuses on providing relief from the symptoms and stress of a serious illness. The goal is to improve quality of life for both the patient and the family.  Otila Kluver tells me patient has been living at [facility] since June of this year.  She tells me prior to that she was living at an assisted living facility but was having about 1 hospitalization per month.  She tells me the patient has had frequent hospitalizations recently.  She tells me patient is wheelchair-bound at baseline and experiences a lot of chronic pain.  She also tells me at baseline patient is quite confused but not as confused as she currently has.   We discussed patient's current illness and what it means in the larger context of patient's on-going co-morbidities.  Natural disease trajectory and expectations at EOL were discussed.  We discussed patient's acute renal failure and current need for CRRT.  Patient has made 2.6 L of urine -possibly close to freeing from CRRT and seeing how she does.  I attempted to elicit values  and goals of care important to the patient.    We discussed if patient would want long-term dialysis.  We discussed what this entails and how it affects quality of life.  Daughter seems to indicate she does not think patient would want to move forward with this.  She does states she would like to talk to her other siblings about it.  She believes they will agree but would like to make sure.  The difference between aggressive medical intervention and comfort care was considered in light of the patient's goals of care.   Discussed with daughter the importance of continued conversation with family and the medical providers regarding overall plan of care and treatment options, ensuring decisions are within the context of the patient's values and GOCs.    Hospice and Palliative Care services outpatient were explained and offered.  We discussed hospice philosophy of care and type of support provided at SNF.  Discussed patient likely eligible for hospice services upon discharge.  Discussed if family is not ready for hospice services could certainly refer to palliative and they can connect her to hospice services when she is ready.  Questions and concerns were addressed. The family was encouraged to call with questions or concerns.    Primary Decision Maker HCPOA -daughter Marcie Bal    SUMMARY OF RECOMMENDATIONS   -Family seems to indicate they do not think patient would want long-term dialysis but they do request time to discuss this with other family members (there are 4 children total) -Discussed involving hospice to follow at SNF upon discharge, daughter will discuss with other family members -PMT to follow-up  Code Status/Advance Care Planning: DNR      Primary Diagnoses: Present on Admission:  Acute renal failure (ARF) ([place])   I have reviewed the medical record, interviewed the patient and family, and examined the patient. The following aspects are pertinent.  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, production: [employer]  Tobacco Use   Smoking status: Former    Packs/day: 1.50    Years: 20.00    Total pack years: 30.00    Types: Cigarettes    Quit date: 08/02/1984    Years since quitting: 37.5   Smokeless tobacco: Never  Vaping Use   Vaping Use: Never used  Substance and Sexual Activity   Alcohol use: No   Drug use: No   Sexual activity: Not Currently    Partners: Male  Other Topics Concern   Not on file  Social History Narrative   NO REG EXERCISE   Caffeine use: daily   Social Determinants of Health   Financial Resource Strain: Low Risk  (09/05/2020)   Overall Financial Resource Strain (CARDIA)    Difficulty of Paying Living Expenses: Not hard at all  Food Insecurity: No Food Insecurity (09/05/2020)   Hunger Vital Sign    Worried About Running Out of Food in the Last Year: Never true    [place] in the Last Year: Never true  Transportation Needs: No Transportation Needs (09/05/2020)   PRAPARE - Hydrologist (Medical): No    Lack of Transportation (Non-Medical): No  Physical Activity: Inactive (09/05/2020)   Exercise Vital Sign    Days of Exercise per Week: 0 days    Minutes of Exercise per Session: 0 min  Stress: No Stress Concern Present (09/05/2020)   [place]    Feeling of Stress : Only a little  Social Connections: Socially Isolated (09/05/2020)   Social  Connection and Isolation Panel [NHANES]    Frequency of Communication with Friends and Family: More than three times a week    Frequency of Social Gatherings with Friends and Family: More than three times a week    Attends Religious Services: Never    Marine scientist or Organizations: No    Attends Archivist Meetings: Never    Marital Status: Widowed   Family History  Problem Relation Age of Onset   Hypertension Mother    Alzheimer's disease Mother    Hyperlipidemia Mother    Heart disease Father        cad   Asthma Father    Cancer Father 23       leukemia   Stroke Father    Hypertension Father    Heart disease Sister 59       MI   Pulmonary fibrosis Sister    Arthritis Brother    Heart disease  Maternal Uncle    Stroke Maternal Uncle    Uterine cancer Paternal Grandmother    Stomach cancer Paternal Grandfather    Heart disease Maternal Uncle    Heart disease Maternal Uncle    Cancer Maternal Aunt        leukemia   Scheduled Meds:  busPIRone  15 mg Oral TID   Chlorhexidine Gluconate Cloth  6 each Topical Daily   DULoxetine  30 mg Oral Daily   gabapentin  100 mg Oral BID   heparin  5,000 Units Subcutaneous Q8H   insulin aspart  0-9 Units Subcutaneous Q4H   lidocaine  1 patch Transdermal Q24H   oxyCODONE  5 mg Oral Q6H WA   pramipexole  1 mg Oral QHS   sertraline  75 mg Oral QHS   traZODone  50 mg Oral QHS   Continuous Infusions:   prismasol BGK 4/2.5 400 mL/hr at 02/12/22 0608    prismasol BGK 4/2.5 400 mL/hr at 02/12/22 8786   sodium chloride Stopped (02/11/22 2147)   norepinephrine (LEVOPHED) Adult infusion     prismasol BGK 4/2.5 1,500 mL/hr at 02/12/22 0924   PRN Meds:.docusate sodium, heparin, heparin, mouth rinse, polyethylene glycol Allergies  Allergen Reactions   Bee Venom     Unknown   Review of Systems  Unable to perform ROS: Mental status change    Physical Exam Constitutional:      General: She is not in acute distress.     Appearance: She is ill-appearing.  Pulmonary:     Effort: Pulmonary effort is normal.  Musculoskeletal:     Right lower leg: Swelling and tenderness present.     Left lower leg: Swelling and tenderness present.     Comments: discolored  Skin:    General: Skin is warm and dry.     Vital Signs: BP 121/75   Pulse 86   Temp 97.7 F (36.5 C) (Oral)   Resp 18   Ht _0  (1.575 m)   Wt 102.1 kg   SpO2 98%   BMI 41.17 kg/m  Pain Scale: PAINAD   Pain Score: 0-No pain   SpO2: SpO2: 98 % O2 Device:SpO2: 98 % O2 Flow Rate: .O2 Flow Rate (L/min): 2 L/min  IO: Intake/output summary:  Intake/Output Summary (Last 24 hours) at 02/12/2022 1228 Last data filed at 02/12/2022 1100 Gross per 24 hour  Intake 948.43 ml  Output 2840 ml  Net -1891.57 ml    LBM: Last BM Date :  (PTA) Baseline Weight: Weight: 117.9 kg Most recent weight: Weight: 102.1 kg     Palliative Assessment/Data: PPS 30%     *Please note that this is a verbal dictation therefore any spelling or grammatical errors are due to the "[place]" system interpretation.  Juel Burrow, DNP, AGNP-C Palliative Medicine Team [phone] Pager: [phone]

## 2022-02-12 NOTE — Progress Notes (Signed)
[facility] Progress Note Patient Name: Erin [name] DOB: [birth year] MRN: [id number]   Date of Service  02/12/2022  HPI/Events of Note  Agitation - Patient trying to get OOB and pull out lines.   eICU Interventions  Plan: Bilateral soft wrist restraints X 3 hours. D/C Buspar PO. Versed 1 mg IV now.      Intervention Category Major Interventions: Delirium, psychosis, severe agitation - evaluation and management  Arabell Neria Eugene 02/12/2022, 6:33 AM

## 2022-02-13 DIAGNOSIS — R4782 Fluency disorder in conditions classified elsewhere: Secondary | ICD-10-CM

## 2022-02-13 DIAGNOSIS — Z7189 Other specified counseling: Secondary | ICD-10-CM | POA: Diagnosis not present

## 2022-02-13 DIAGNOSIS — N179 Acute kidney failure, unspecified: Secondary | ICD-10-CM | POA: Diagnosis not present

## 2022-02-13 DIAGNOSIS — R799 Abnormal finding of blood chemistry, unspecified: Secondary | ICD-10-CM

## 2022-02-13 DIAGNOSIS — E875 Hyperkalemia: Secondary | ICD-10-CM | POA: Diagnosis not present

## 2022-02-13 DIAGNOSIS — R001 Bradycardia, unspecified: Secondary | ICD-10-CM

## 2022-02-13 DIAGNOSIS — G9341 Metabolic encephalopathy: Secondary | ICD-10-CM | POA: Diagnosis not present

## 2022-02-13 LAB — RENAL FUNCTION PANEL
Albumin: 3.1 g/dL — ABNORMAL LOW (ref 3.5–5.0)
Anion gap: 10 (ref 5–15)
BUN: 33 mg/dL — ABNORMAL HIGH (ref 8–23)
CO2: 24 mmol/L (ref 22–32)
Calcium: 8.7 mg/dL — ABNORMAL LOW (ref 8.9–10.3)
Chloride: 108 mmol/L (ref 98–111)
Creatinine, Ser: 0.94 mg/dL (ref 0.44–1.00)
GFR, Estimated: >60 mL/min (ref >60)
Glucose, Bld: 99 mg/dL (ref 70–99)
Phosphorus: 2 mg/dL — ABNORMAL LOW (ref 2.5–4.6)
Potassium: 4.1 mmol/L (ref 3.5–5.1)
Sodium: 142 mmol/L (ref 135–145)

## 2022-02-13 LAB — CBC WITH DIFFERENTIAL/PLATELET
Abs Immature Granulocytes: 0.37 10*3/uL — ABNORMAL HIGH (ref 0.00–0.07)
Basophils Absolute: 0 10*3/uL (ref 0.0–0.1)
Basophils Relative: 1 %
Eosinophils Absolute: 0 10*3/uL (ref 0.0–0.5)
Eosinophils Relative: 1 %
HCT: 29.5 % — ABNORMAL LOW (ref 36.0–46.0)
Hemoglobin: 9.3 g/dL — ABNORMAL LOW (ref 12.0–15.0)
Immature Granulocytes: 8 %
Lymphocytes Relative: 11 %
Lymphs Abs: 0.6 10*3/uL — ABNORMAL LOW (ref 0.7–4.0)
MCH: 29 pg (ref 26.0–34.0)
MCHC: 31.5 g/dL (ref 30.0–36.0)
MCV: 91.9 fL (ref 80.0–100.0)
Monocytes Absolute: 0.5 10*3/uL (ref 0.1–1.0)
Monocytes Relative: 10 %
Neutro Abs: 3.4 10*3/uL (ref 1.7–7.7)
Neutrophils Relative %: 69 %
Platelets: 52 10*3/uL — ABNORMAL LOW (ref 150–400)
RBC: 3.21 MIL/uL — ABNORMAL LOW (ref 3.87–5.11)
RDW: 17.7 % — ABNORMAL HIGH (ref 11.5–15.5)
WBC: 4.9 10*3/uL (ref 4.0–10.5)
nRBC: 2 % — ABNORMAL HIGH (ref 0.0–0.2)

## 2022-02-13 LAB — GLUCOSE, CAPILLARY
Glucose-Capillary: 93 mg/dL (ref 70–99)
Glucose-Capillary: 94 mg/dL (ref 70–99)
Glucose-Capillary: 138 mg/dL — ABNORMAL HIGH (ref 70–99)

## 2022-02-13 LAB — MAGNESIUM: Magnesium: 2.3 mg/dL (ref 1.7–2.4)

## 2022-02-13 MED ORDER — HYDROCODONE-ACETAMINOPHEN 5-325 MG PO TABS: 1.0000 | ORAL_TABLET | ORAL | 0 refills | Status: DC

## 2022-02-13 MED ORDER — FLUCONAZOLE 200 MG PO TABS
200.0000 mg | ORAL_TABLET | Freq: Every day | ORAL | Status: DC
  Filled 2022-02-13: qty 1

## 2022-02-13 MED ORDER — POTASSIUM CHLORIDE CRYS ER 10 MEQ PO TBCR: 10.0000 meq | EXTENDED_RELEASE_TABLET | Freq: Every day | ORAL | Status: AC

## 2022-02-13 MED ORDER — DICLOFENAC SODIUM 1 % EX GEL: 4.0000 g | Freq: Four times a day (QID) | CUTANEOUS | Status: AC | PRN

## 2022-02-13 MED ORDER — GABAPENTIN 100 MG PO CAPS: 100.0000 mg | ORAL_CAPSULE | Freq: Two times a day (BID) | ORAL | Status: DC

## 2022-02-13 MED ORDER — TORSEMIDE 20 MG PO TABS: 20.0000 mg | ORAL_TABLET | Freq: Every day | ORAL | Status: DC

## 2022-02-13 NOTE — Progress Notes (Signed)
Patient ID: [name], female   DOB: 17-Oct-1945, 76 y.o.   MRN: [id number]    Progress Note from the Palliative Medicine Team at [facility]   Patient Name: [name]        Date: 02/13/2022 DOB: [birth year]  Age: 76 y.o. MRN#: [id number] Attending Physician: Oswald Hillock, MD Primary Care Physician: Donnajean Lopes, MD Admit Date: 02/11/2022   Medical records reviewed, assessed patient and spoke to treatment team   76 y.o. female  with past medical history of  thrombocytopenia, dementia, myelofibrosis, CKD III, and stroke with residual aphasia admitted on 02/11/2022 with AMS and AKI. Requiring CRRT. PMT consulted to discuss [place].    This NP assessed patient at the bedside as a follow up for palliative medicine needs and emotional support.   Patient is alert and pleasant, communication is complicated by significant aphasia   I was able to speak to daughter Marcie Bal by phone.   Education offered on patient's multiple comorbidities and her high risk for decompensation secondary to her current medical situation  Education offered to daughter on the importance of follow-up with nephrology and cardiology as outpatient.  Detailed conversation regarding best case scenario versus worst-case scenario and the importance of contemplating anticipatory care needs.  Education offered on the importance of documentation of advance care; living will and H POA  Plan is back to SNF today, I recommend outpatient palliative services to follow.  Recommend outpatient palliative services.  Education offered today regarding  the importance of continued conversation with family and the  medical providers regarding overall plan of care and treatment options,  ensuring decisions are within the context of the patients values and GOCs.  Questions and concerns addressed   Discussed with Dr Darrick Meigs  35 minutes  Wadie Lessen NP  Palliative Medicine Team Team Phone # [phone] Pager [phone]

## 2022-02-13 NOTE — Progress Notes (Signed)
Per report, patient required bilateral soft wrist restraints prior to my shift but the restraints had been taken off and are no longer on. Upon assessment, restraints were off the patient, but the order had not been discontinued and the restraints were not charted as discontinued in the flowsheet. At this time, the restraint order is discontinued and the flowsheet is documented as discontinued as well.

## 2022-02-13 NOTE — Progress Notes (Signed)
[facility] Progress Note   76 y.o. female with HTN, OSA on CPAP, dCHF, myelofibrosis/pancytopenia, RLS, DM, left MCA stroke, cognition impairment, CHF followed by nephrologist in [facility], p/w AMS from SNF, bradycardia seen as a consultation for acute kidney injury and hyperkalemia. [facility] w/ labs done on 11/8 showing cr 3.86, K 6.9, CO2 17.  The patient becoming confused, lethargic and became bradycardic. The son noted that she has been progressively worsening for the last 4 to 5 days.  For CKD, she follows with Dr. Olivia Mackie at [facility].  The CKD was thought to be due to hypertension and microvascular disease.  Baseline serum creatinine level seems to be around 1.5-1.7.  She was on torsemide for the management of chronic lower extremity edema. Home medication does not include ACE inhibitor, ARB or ACE 2 inhibitor.    Assessment/ Plan:   1) Acute kidney injury on CKD stage IIIb with hyperkalemia and volume overload: Exact etiology  of AKI unknown at this time.  Concerning for CHF exacerbation, ?  Infection.  Need to rule out bladder obstruction.  I will check UA, bladder scan, kidney ultrasound.  Given, hyperkalemia, bradycardia, volume overload and uremic encephalopathy, started on CRRT 11/9. Need to be cautious with anticoagulation because of myelofibrosis/thrombocytopenia.  Initially 2K bath and UF around 50 cc an hour net negative.   CRRT for <24hrs and off at [facility] 11/10. It was interesting that the UOP was 2.6L after Lasix 60 x1 while on CRRT. Now off CRRT 11/10 at 3PM  and creatinine stable at 0.94 this AM.  Signing off at this time; please reconsult as needed.  Daily lab monitoring, strict ins and out.   2) Hyperkalemia with bradycardia: Treated medically by EMS and in ER.  Heart rate is improving.  Started dialysis with low potassium bath -> 4K am of 11/10 and off CRRT 3PM on 11/10.  Monitor lab -> resolved.   3) Acute metabolic, uremic encephalopathy with  superimposed chronic dementia: It seems like she has dementia and physical deconditioning after stroke.  As per her son, she is more confused and lethargic.  Treated w/ CRRT to help with uremia initially but off CRRT now.   4) Metabolic acidosis: Follow-up CO2 level and manage with dialysis.  No need for sodium bicarbonate.   5) Hyponatremia, Resolved.  Subjective:   Confused, but alert. Denies f/c/n/v/sob   Objective:   BP (!) 188/69 (BP Location: Right Arm)   Pulse 92   Temp 97.7 F (36.5 C) (Oral)   Resp 18   Ht 5\' 2"  (1.575 m)   Wt 102.1 kg   SpO2 98%   BMI 41.17 kg/m   Intake/Output Summary (Last 24 hours) at 02/13/2022 4132 Last data filed at 02/12/2022 2000 Gross per 24 hour  Intake 80 ml  Output 753 ml  Net -673 ml   Weight change:   Physical Exam: General exam: Confused female lying on bed, not in distress Respiratory system: Distant breath sound but no rales Cardiovascular system: S1 & S2 heard, RRR.  1+ lower extremities pitting edema present, chronic in nature. Gastrointestinal system: Abdomen is nondistended, soft and nontender.  Central nervous system: Alert, awake and oriented to person only.  Not oriented to the place Extremities: Chronic leg edema present.  No cyanosis. Skin: No rashes, lesions or ulcers Psychiatry: Judgement and insight appear impaired Access: Rt fem temp cath  Imaging: US RENAL  Result Date: 02/12/2022 CLINICAL DATA:  Acute kidney injury. EXAM: RENAL /  URINARY TRACT ULTRASOUND COMPLETE COMPARISON:  December 17, 2021. FINDINGS: Right Kidney: Renal measurements: 9.4 x 4.9 x 4.6 cm = volume: 111 mL. Mild renal cortical thinning is noted. Echogenicity within normal limits. No mass or hydronephrosis visualized. Left Kidney: Renal measurements: 10.8 x 6.7 x 7.4 cm = volume: 279 mL. Mild renal cortical thinning is noted. Echogenicity within normal limits. No mass or hydronephrosis visualized. Bladder: Decompressed secondary to Foley catheter.  Other: None. IMPRESSION: Mild bilateral renal cortical thinning is noted. No hydronephrosis or renal obstruction is noted. Electronically Signed   By: Marijo Conception M.D.   On: 02/12/2022 14:19   DG Chest Portable 1 View  Result Date: 02/11/2022 CLINICAL DATA:  Bradycardia. EXAM: PORTABLE CHEST 1 VIEW COMPARISON:  December 16, 2021. FINDINGS: Stable cardiomegaly.  Lungs are clear.  Bony thorax is unremarkable. IMPRESSION: No active disease. Electronically Signed   By: Marijo Conception M.D.   On: 02/11/2022 12:27    Labs: BMET Recent Labs  Lab 02/11/22 1155 02/11/22 1231 02/11/22 1531 02/11/22 2135 02/12/22 0356 02/12/22 0802 02/12/22 1613 02/13/22 0351  NA 134* 131* 137  --  135 139 140 142  K 6.9* 6.6* 5.8*  --  3.8 4.1 4.0 4.1  CL 106 106 108  --  105 106 108 108  CO2 14*  --  17*  --  20* 24 26 24   GLUCOSE 141* 138* 60* 94 86 118* 105* 99  BUN 160* >130* 158*  --  90* 70* 43* 33*  CREATININE 3.89* 4.30* 3.67*  --  1.77* 1.43* 0.97 0.94  CALCIUM 9.8  --  9.0  --  8.0* 8.0* 7.9* 8.7*  PHOS  --   --  7.7*  --  4.0  --  2.2* 2.0*   CBC Recent Labs  Lab 02/11/22 1155 02/11/22 1231 02/12/22 0356 02/13/22 0351  WBC 6.6  --  4.1 4.9  NEUTROABS 5.0  --  3.0 3.4  HGB 9.5* 9.5* 8.6* 9.3*  HCT 30.7* 28.0* 26.7* 29.5*  MCV 95.6  --  92.4 91.9  PLT 67*  --  47* 52*    Medications:     busPIRone  15 mg Oral TID   Chlorhexidine Gluconate Cloth  6 each Topical Q0600   DULoxetine  30 mg Oral Daily   gabapentin  100 mg Oral BID   heparin  5,000 Units Subcutaneous Q8H   insulin aspart  0-9 Units Subcutaneous Q4H   lidocaine  1 patch Transdermal Q24H   mupirocin ointment  1 Application Nasal BID   oxyCODONE  5 mg Oral Q6H WA   pramipexole  1 mg Oral QHS   sertraline  75 mg Oral QHS   traZODone  50 mg Oral QHS      Otelia Santee, MD 02/13/2022, 8:42 AM

## 2022-02-13 NOTE — Discharge Summary (Addendum)
Physician Discharge Summary   Patient: Erin [name] MRN: [id number] DOB: 07-16-1945  Admit date:     02/11/2022  Discharge date: 02/13/22  Discharge Physician: Oswald Hillock   PCP: Donnajean Lopes, MD   Recommendations at discharge:   Check BMP in 1 week  Discharge Diagnoses: Principal Problem:   Acute renal failure (ARF) ([place]) Active Problems:   Hyperkalemia   Acute metabolic encephalopathy   Bradycardia  Resolved Problems:   * No resolved hospital problems. [facility] Course: 75 yo F PMH thrombocytopenia, dementia, myelofibrosis, CKD III, stroke with residual aphasia presented to ED 02/11/22 from [place] for evaluation of bradycardia. Associated AMS. Son notes that pt has become progressively more altered x 4-5 days.  Reportedly had labs checked yesterday and K was 6.9, Cr 3.86.   More altered and bradycardic today prompting EMS dispatch. Given Calcium chloride x1 and albuterol with EMS for known hyperkalemia.   In ED, K 6.6, Cr 4.3, BUN undetectably high.   ECG reveals bradycardic rhythm + RBBB, LAFB. Qtc 441. Received insulin + dextrose, lasix in ED.    Similar presentation to [place] 12/16/21 -- AKI on CKD, hyperK, bradycardia. Supported medically, did not req dialysis. Tx for serratia marcescens UTI    Assessment and Plan:  Acute metabolic encephalopathy superimposed on vascular dementia -Resolved, patient back to baseline -Urine culture was negative so antibiotics were discontinued  History of CVA with resultant aphasia -Stable  Acute kidney injury superimposed on CKD stage III with hyperkalemia and uremia -Resolved, nephrology was consulted -Patient underwent CRRT -Patient takes torsemide 40 mg daily at home, dose will be changed to torsemide 20 mg p.o. daily -We will cut down the potassium supplementation to K-Dur 10 meq daily -Check BMP in 1 week  Current diastolic heart failure -Dose of torsemide changed to 20 mg  daily  Hyperkalemia -Resolved -Treated medically with calcium chloride, albuterol, insulin, dextrose and Lasix in the ED  Metabolic acidosis -Resolved       Consultants: PCCM, nephrology Procedures performed: CRRT Disposition: Home Diet recommendation:  Discharge Diet Orders (From admission, onward)     Start     Ordered   02/13/22 0000  Diet - low sodium heart healthy        02/13/22 1258           Carb modified diet DISCHARGE MEDICATION: Allergies as of 02/13/2022       Reactions   Bee Venom    Unknown        Medication List     STOP taking these medications    cefTRIAXone 1 g injection Commonly known as: ROCEPHIN   divalproex 125 MG capsule Commonly known as: DEPAKOTE SPRINKLE       TAKE these medications    Acetaminophen 500 MG capsule Take 1 capsule by mouth every 8 (eight) hours as needed for pain.   atorvastatin 40 MG tablet Commonly known as: LIPITOR TAKE 1 TABLET (40 MG TOTAL) BY MOUTH DAILY. PT NEEDS OV FOR FURTHER REFILLS What changed:  when to take this additional instructions   busPIRone 15 MG tablet Commonly known as: BUSPAR Take 1 tablet (15 mg total) by mouth 3 (three) times daily. Pt needs OV for further refills   carvedilol 6.25 MG tablet Commonly known as: COREG TAKE 1.5 TABLETS (9.375 MG TOTAL) BY MOUTH 2 (TWO) TIMES DAILY WITH A MEAL. What changed: how much to take   Cholecalciferol 25 MCG (1000 UT) tablet Take 1 tablet by  mouth daily.   Dermacloud Oint Apply 1 Application topically daily.   diclofenac Sodium 1 % Gel Commonly known as: VOLTAREN Apply 4 g topically 4 (four) times daily as needed (knee pain). What changed:  how much to take when to take this reasons to take this   docusate sodium 100 MG capsule Commonly known as: COLACE Take 100 mg by mouth 2 (two) times daily.   DULoxetine 30 MG capsule Commonly known as: CYMBALTA Take 30 mg by mouth daily.   gabapentin 100 MG capsule Commonly known  as: NEURONTIN Take 1 capsule (100 mg total) by mouth 2 (two) times daily. What changed:  medication strength how much to take when to take this   HYDROcodone-acetaminophen 5-325 MG tablet Commonly known as: NORCO/VICODIN Take 1 tablet by mouth every 4 (four) hours.   ipratropium 0.06 % nasal spray Commonly known as: ATROVENT Place 2 sprays into both nostrils in the morning and at bedtime.   loperamide 2 MG tablet Commonly known as: IMODIUM A-D Take 2 mg by mouth 4 (four) times daily as needed for diarrhea or loose stools.   metoCLOPramide 5 MG tablet Commonly known as: REGLAN Take 5 mg by mouth daily.   Multi-Vitamin tablet Take 1 tablet by mouth daily.   ondansetron 4 MG tablet Commonly known as: ZOFRAN Take 4 mg by mouth every 6 (six) hours as needed for nausea or vomiting.   pantoprazole 40 MG tablet Commonly known as: PROTONIX TAKE 1 TABLET BY MOUTH EVERY DAY   potassium chloride 10 MEQ tablet Commonly known as: KLOR-CON M Take 1 tablet (10 mEq total) by mouth daily. What changed:  medication strength how much to take when to take this   pramipexole 1 MG tablet Commonly known as: MIRAPEX TAKE 1 TABLET BY MOUTH AT BEDTIME.   Sennosides-Docusate Sodium 8.6-50 MG Caps Take 1 tablet by mouth daily.   sertraline 50 MG tablet Commonly known as: ZOLOFT TAKE 1 AND 1/2 TABLETS DAILY BY MOUTH What changed:  how much to take how to take this when to take this additional instructions   torsemide 20 MG tablet Commonly known as: DEMADEX Take 1 tablet (20 mg total) by mouth daily. What changed:  medication strength how much to take   traZODone 50 MG tablet Commonly known as: DESYREL Take 50 mg by mouth at bedtime.        Discharge Exam: Filed Weights   02/11/22 1132 02/11/22 1700 02/12/22 0500  Weight: 117.9 kg 105.8 kg 102.1 kg   Appears in no acute distress S1-S2, regular Clear to auscultation bilaterally No edema in the lower  extremities  Condition at discharge: good  The results of significant diagnostics from this hospitalization (including imaging, microbiology, ancillary and laboratory) are listed below for reference.   Imaging Studies: US RENAL  Result Date: 02/12/2022 CLINICAL DATA:  Acute kidney injury. EXAM: RENAL / URINARY TRACT ULTRASOUND COMPLETE COMPARISON:  December 17, 2021. FINDINGS: Right Kidney: Renal measurements: 9.4 x 4.9 x 4.6 cm = volume: 111 mL. Mild renal cortical thinning is noted. Echogenicity within normal limits. No mass or hydronephrosis visualized. Left Kidney: Renal measurements: 10.8 x 6.7 x 7.4 cm = volume: 279 mL. Mild renal cortical thinning is noted. Echogenicity within normal limits. No mass or hydronephrosis visualized. Bladder: Decompressed secondary to Foley catheter. Other: None. IMPRESSION: Mild bilateral renal cortical thinning is noted. No hydronephrosis or renal obstruction is noted. Electronically Signed   By: Marijo Conception M.D.   On: 02/12/2022 14:19  DG Chest Portable 1 View  Result Date: 02/11/2022 CLINICAL DATA:  Bradycardia. EXAM: PORTABLE CHEST 1 VIEW COMPARISON:  December 16, 2021. FINDINGS: Stable cardiomegaly.  Lungs are clear.  Bony thorax is unremarkable. IMPRESSION: No active disease. Electronically Signed   By: Marijo Conception M.D.   On: 02/11/2022 12:27    Microbiology: Results for orders placed or performed during the hospital encounter of 02/11/22  Urine Culture     Status: None   Collection Time: 02/11/22  4:19 PM   Specimen: Urine, Clean Catch  Result Value Ref Range Status   Specimen Description URINE, CLEAN CATCH  Final   Special Requests NONE  Final   Culture   Final    NO GROWTH Performed at [facility], [place] [address], [place], [place] [address]    Report Status 02/12/2022 FINAL  Final  MRSA Next Gen by PCR, Nasal     Status: Abnormal   Collection Time: 02/12/22  4:30 AM   Specimen: Nasal Mucosa; Nasal Swab  Result Value Ref  Range Status   MRSA by PCR Next Gen DETECTED (A) NOT DETECTED Final    Comment: RESULT CALLED TO, READ BACK BY AND VERIFIED WITH: RN A GANBARI [id number] AT 0641 AM BY CM (NOTE) The GeneXpert MRSA Assay (FDA approved for NASAL specimens only), is one component of a comprehensive MRSA colonization surveillance program. It is not intended to diagnose MRSA infection nor to guide or monitor treatment for MRSA infections. Test performance is not FDA approved in patients less than 2 years old. Performed at [facility], [place] [address], [place], [place] [address]     Labs: CBC: Recent Labs  Lab 02/11/22 1155 02/11/22 1231 02/12/22 0356 02/13/22 0351  WBC 6.6  --  4.1 4.9  NEUTROABS 5.0  --  3.0 3.4  HGB 9.5* 9.5* 8.6* 9.3*  HCT 30.7* 28.0* 26.7* 29.5*  MCV 95.6  --  92.4 91.9  PLT 67*  --  47* 52*   Basic Metabolic Panel: Recent Labs  Lab 02/11/22 1155 02/11/22 1231 02/11/22 1531 02/11/22 2135 02/12/22 0356 02/12/22 0802 02/12/22 1613 02/13/22 0351  NA 134*   < > 137  --  135 139 140 142  K 6.9*   < > 5.8*  --  3.8 4.1 4.0 4.1  CL 106   < > 108  --  105 106 108 108  CO2 14*  --  17*  --  20* 24 26 24   GLUCOSE 141*   < > 60* 94 86 118* 105* 99  BUN 160*   < > 158*  --  90* 70* 43* 33*  CREATININE 3.89*   < > 3.67*  --  1.77* 1.43* 0.97 0.94  CALCIUM 9.8  --  9.0  --  8.0* 8.0* 7.9* 8.7*  MG 2.0  --   --   --  2.1  --   --  2.3  PHOS  --   --  7.7*  --  4.0  --  2.2* 2.0*   < > = values in this interval not displayed.   Liver Function Tests: Recent Labs  Lab 02/11/22 1155 02/11/22 1531 02/12/22 0356 02/12/22 1613 02/13/22 0351  AST 46*  --   --   --   --   ALT 56*  --   --   --   --   ALKPHOS 115  --   --   --   --   BILITOT 0.6  --   --   --   --  PROT 6.1*  --   --   --   --   ALBUMIN 3.5 3.5 3.1* 2.8* 3.1*   CBG: Recent Labs  Lab 02/12/22 1937 02/12/22 2324 02/13/22 0749 02/13/22 0802 02/13/22 1224  GLUCAP 117* 100* 93 94 138*    Discharge  time spent: greater than 30 minutes.  Signed: Oswald Hillock, MD [facility] 02/13/2022

## 2022-02-13 NOTE — Progress Notes (Signed)
Patient refuses CBG finger stick for 0400. Educated on importance. No evidence of learning.

## 2022-02-13 NOTE — Progress Notes (Signed)
Soft Mitts removed.

## 2022-02-13 NOTE — TOC Transition Note (Signed)
Transition of Care [facility]) - CM/SW Discharge Note   Patient Details  Name: Erin [name] MRN: [id number] Date of Birth: 10/18/1945  Transition of Care [facility]) CM/SW Contact:  Elliot Gurney [place],  Phone Number: 02/13/2022, 1:28 PM   Clinical Narrative:     Patient to discharge back to [place]. Discharge summary faxed over to admissions coordinator through the [facility], Patient to go to room 305B , RN to call report to Tammie at [phone]. [facility] contacted for transport, patient's son notified of patient's discharge back to [facility] today.   Kaylla Cobos, LCSW Transition of Care          Patient Goals and CMS Choice        Discharge Placement                       Discharge Plan and Services                                     Social Determinants of Health (SDOH) Interventions     Readmission Risk Interventions     No data to display

## 2022-03-03 ENCOUNTER — Other Ambulatory Visit: Payer: Self-pay

## 2022-03-03 ENCOUNTER — Telehealth: Payer: Self-pay | Admitting: Family Medicine

## 2022-03-03 DIAGNOSIS — M5416 Radiculopathy, lumbar region: Secondary | ICD-10-CM

## 2022-03-03 NOTE — Telephone Encounter (Signed)
Epidural ordered.  

## 2022-03-03 NOTE — Telephone Encounter (Signed)
Pt daughter requesting order for another epidural, last done 09/24/2021. No changes requested.

## 2022-03-04 NOTE — Telephone Encounter (Signed)
Patient notified

## 2022-03-17 ENCOUNTER — Other Ambulatory Visit: Payer: Medicare Other

## 2022-03-19 ENCOUNTER — Ambulatory Visit
Admission: RE | Admit: 2022-03-19 | Discharge: 2022-03-19 | Disposition: A | Discharge status: Home / Self Care | Payer: Medicare Other | Source: Ambulatory Visit | Attending: Family Medicine | Admitting: Family Medicine

## 2022-03-19 DIAGNOSIS — M5416 Radiculopathy, lumbar region: Secondary | ICD-10-CM

## 2022-03-19 MED ORDER — IOPAMIDOL (ISOVUE-M 200) INJECTION 41%
1.0000 mL | Freq: Once | INTRAMUSCULAR | Status: AC
  Administered 2022-03-19: 1 mL via EPIDURAL

## 2022-03-19 MED ORDER — METHYLPREDNISOLONE ACETATE 40 MG/ML INJ SUSP (RADIOLOG
80.0000 mg | Freq: Once | INTRAMUSCULAR | Status: AC
  Administered 2022-03-19: 80 mg via EPIDURAL

## 2022-03-19 NOTE — Discharge Instructions (Signed)

## 2022-04-15 NOTE — Progress Notes (Deleted)
[name] [address] [place] [address] Phone: [phone] Subjective:    I'm seeing this patient by the request  of:  Garlan Fillers, MD  CC:   [id number]  01/19/2022 Repeat injection given again today.   Problem with worsening symptoms.  Social determinants of health includes patient's significant dementia that would not allow patient to make other decisions will be a surgical candidate.  At this point injections every 3 months seems to be more beneficial.  Update 04/21/2022 Erin [name] is a 77 y.o. female coming in with complaint of B knee pain. Patient states        Past Medical History:  Diagnosis Date   Asthma    CHF (congestive heart failure) (HCC)    hospital 11/2013-- [place]   Chronic kidney disease    Depression    Diabetes mellitus (HCC)    GERD (gastroesophageal reflux disease)    Hyperlipidemia    Hypertension    Stroke [facility])    Past Surgical History:  Procedure Laterality Date   CHOLECYSTECTOMY  2002   LOOP RECORDER INSERTION N/A 08/01/2017   Procedure: LOOP RECORDER INSERTION;  Surgeon: Regan Lemming, MD;  Location: [facility] INVASIVE CV LAB;  Service: Cardiovascular;  Laterality: N/A;   SPINE SURGERY  aug 2015   [facility]   TEE WITHOUT CARDIOVERSION N/A 08/01/2017   Procedure: TRANSESOPHAGEAL ECHOCARDIOGRAM (TEE);  Surgeon: Thurmon Fair, MD;  Location: [facility] ENDOSCOPY;  Service: Cardiovascular;  Laterality: N/A;   TONSILLECTOMY     TUBAL LIGATION     Social History   Socioeconomic History   Marital status: Widowed    Spouse name: Not on file   Number of children: 4   Years of education: 47   Highest education level: 12th grade  Occupational History   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, materials: [employer]  Tobacco Use   Smoking status: Former    Packs/day: 1.50    Years: 20.00    Total pack years: 30.00    Types: Cigarettes    Quit date: 08/02/1984    Years since  quitting: 37.7   Smokeless tobacco: Never  Vaping Use   Vaping Use: Never used  Substance and Sexual Activity   Alcohol use: No   Drug use: No   Sexual activity: Not Currently    Partners: Male  Other Topics Concern   Not on file  Social History Narrative   NO REG EXERCISE   Caffeine use: daily   Social Determinants of Health   Financial Resource Strain: Low Risk  (09/05/2020)   Overall Financial Resource Strain (CARDIA)    Difficulty of Paying Living Expenses: Not hard at all  Food Insecurity: No Food Insecurity (09/05/2020)   Hunger Vital Sign    Worried About Running Out of Food in the Last Year: Never true    Ran Out of Food in the Last Year: Never true  Transportation Needs: No Transportation Needs (09/05/2020)   PRAPARE - Administrator, Civil Service (Medical): No    Lack of Transportation (Non-Medical): No  Physical Activity: Inactive (09/05/2020)   Exercise Vital Sign    Days of Exercise per Week: 0 days    Minutes of Exercise per Session: 0 min  Stress: No Stress Concern Present (09/05/2020)   [employer] of Occupational Health - Occupational Stress Questionnaire    Feeling of Stress : Only a little  Social Connections: Socially Isolated (09/05/2020)  Social Advertising account executive [NHANES]    Frequency of Communication with Friends and Family: More than three times a week    Frequency of Social Gatherings with Friends and Family: More than three times a week    Attends Religious Services: Never    Database administrator or Organizations: No    Attends Banker Meetings: Never    Marital Status: Widowed   Allergies  Allergen Reactions   Bee Venom     Unknown   Family History  Problem Relation Age of Onset   Hypertension Mother    Alzheimer's disease Mother    Hyperlipidemia Mother    Heart disease Father        cad   Asthma Father    Cancer Father 9       leukemia   Stroke Father    Hypertension Father    Heart disease  Sister 2       MI   Pulmonary fibrosis Sister    Arthritis Brother    Heart disease Maternal Uncle    Stroke Maternal Uncle    Uterine cancer Paternal Grandmother    Stomach cancer Paternal Grandfather    Heart disease Maternal Uncle    Heart disease Maternal Uncle    Cancer Maternal Aunt        leukemia     Current Outpatient Medications (Cardiovascular):    atorvastatin (LIPITOR) 40 MG tablet, TAKE 1 TABLET (40 MG TOTAL) BY MOUTH DAILY. PT NEEDS OV FOR FURTHER REFILLS (Patient taking differently: Take 1 tablet by mouth every evening.)   carvedilol (COREG) 6.25 MG tablet, TAKE 1.5 TABLETS (9.375 MG TOTAL) BY MOUTH 2 (TWO) TIMES DAILY WITH A MEAL. (Patient taking differently: Take 6.25 mg by mouth 2 (two) times daily with a meal.)   torsemide (DEMADEX) 20 MG tablet, Take 1 tablet (20 mg total) by mouth daily.  Current Outpatient Medications (Respiratory):    ipratropium (ATROVENT) 0.06 % nasal spray, Place 2 sprays into both nostrils in the morning and at bedtime.  Current Outpatient Medications (Analgesics):    Acetaminophen 500 MG capsule, Take 1 capsule by mouth every 8 (eight) hours as needed for pain.   HYDROcodone-acetaminophen (NORCO/VICODIN) 5-325 MG tablet, Take 1 tablet by mouth every 4 (four) hours.   Current Outpatient Medications (Other):    busPIRone (BUSPAR) 15 MG tablet, Take 1 tablet (15 mg total) by mouth 3 (three) times daily. Pt needs OV for further refills   Cholecalciferol 25 MCG (1000 UT) tablet, Take 1 tablet by mouth daily.   diclofenac Sodium (VOLTAREN) 1 % GEL, Apply 4 g topically 4 (four) times daily as needed (knee pain).   docusate sodium (COLACE) 100 MG capsule, Take 100 mg by mouth 2 (two) times daily.   DULoxetine (CYMBALTA) 30 MG capsule, Take 30 mg by mouth daily.   gabapentin (NEURONTIN) 100 MG capsule, Take 1 capsule (100 mg total) by mouth 2 (two) times daily.   Infant Care Products (DERMACLOUD) OINT, Apply 1 Application topically daily.    loperamide (IMODIUM A-D) 2 MG tablet, Take 2 mg by mouth 4 (four) times daily as needed for diarrhea or loose stools.   metoCLOPramide (REGLAN) 5 MG tablet, Take 5 mg by mouth daily.   Multiple Vitamin (MULTI-VITAMIN) tablet, Take 1 tablet by mouth daily.   ondansetron (ZOFRAN) 4 MG tablet, Take 4 mg by mouth every 6 (six) hours as needed for nausea or vomiting.   pantoprazole (PROTONIX) 40 MG tablet, TAKE 1  TABLET BY MOUTH EVERY DAY (Patient taking differently: Take 40 mg by mouth daily.)   potassium chloride (KLOR-CON M) 10 MEQ tablet, Take 1 tablet (10 mEq total) by mouth daily.   pramipexole (MIRAPEX) 1 MG tablet, TAKE 1 TABLET BY MOUTH AT BEDTIME. (Patient taking differently: Take 1 mg by mouth at bedtime.)   Sennosides-Docusate Sodium 8.6-50 MG CAPS, Take 1 tablet by mouth daily.   sertraline (ZOLOFT) 50 MG tablet, TAKE 1 AND 1/2 TABLETS DAILY BY MOUTH (Patient taking differently: Take 75 mg by mouth at bedtime.)   traZODone (DESYREL) 50 MG tablet, Take 50 mg by mouth at bedtime.   Reviewed prior external information including notes and imaging from  primary care provider As well as notes that were available from care everywhere and other healthcare systems.  Past medical history, social, surgical and family history all reviewed in electronic medical record.  No pertanent information unless stated regarding to the chief complaint.   Review of Systems:  No headache, visual changes, nausea, vomiting, diarrhea, constipation, dizziness, abdominal pain, skin rash, fevers, chills, night sweats, weight loss, swollen lymph nodes, body aches, joint swelling, chest pain, shortness of breath, mood changes. POSITIVE muscle aches  Objective  There were no vitals taken for this visit.   General: No apparent distress alert and oriented x3 mood and affect normal, dressed appropriately.  HEENT: Pupils equal, extraocular movements intact  Respiratory: Patient's speak in full sentences and does not  appear short of breath  Cardiovascular: No lower extremity edema, non tender, no erythema      Impression and Recommendations:

## 2022-04-21 ENCOUNTER — Ambulatory Visit: Payer: Medicare Other | Admitting: Family Medicine

## 2022-04-22 NOTE — Progress Notes (Unsigned)
[name] [address] [place] [address] Phone: [phone] Subjective:   [name], am serving as a scribe for Dr. Antoine [name].  I'm seeing this patient by the request  of:  Erin Fillers, MD  CC: Bilateral leg pain, back pain and pain all over  AOU:MNARUOOWIN  01/19/2022 Repeat injection given again today.   Problem with worsening symptoms.  Social determinants of health includes patient's significant dementia that would not allow patient to make other decisions will be a surgical candidate.  At this point injections every 3 months seems to be more beneficial.      Update 04/27/2022 Erin [name] is a 77 y.o. female coming in with complaint of B knee pain. Patient states same per usual. Here for bilateral knee injections        Past Medical History:  Diagnosis Date   Asthma    CHF (congestive heart failure) (HCC)    hospital 11/2013-- [place]   Chronic kidney disease    Depression    Diabetes mellitus (HCC)    GERD (gastroesophageal reflux disease)    Hyperlipidemia    Hypertension    Stroke [facility])    Past Surgical History:  Procedure Laterality Date   CHOLECYSTECTOMY  2002   LOOP RECORDER INSERTION N/A 08/01/2017   Procedure: LOOP RECORDER INSERTION;  Surgeon: Regan Lemming, MD;  Location: [facility] INVASIVE CV LAB;  Service: Cardiovascular;  Laterality: N/A;   SPINE SURGERY  aug 2015   [facility]   TEE WITHOUT CARDIOVERSION N/A 08/01/2017   Procedure: TRANSESOPHAGEAL ECHOCARDIOGRAM (TEE);  Surgeon: Erin Fair, MD;  Location: [facility] ENDOSCOPY;  Service: Cardiovascular;  Laterality: N/A;   TONSILLECTOMY     TUBAL LIGATION     Social History   Socioeconomic History   Marital status: Widowed    Spouse name: Not on file   Number of children: 4   Years of education: 89   Highest education level: 12th grade  Occupational History   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, materials: [employer]   Tobacco Use   Smoking status: Former    Packs/day: 1.50    Years: 20.00    Total pack years: 30.00    Types: Cigarettes    Quit date: 08/02/1984    Years since quitting: 37.7   Smokeless tobacco: Never  Vaping Use   Vaping Use: Never used  Substance and Sexual Activity   Alcohol use: No   Drug use: No   Sexual activity: Not Currently    Partners: Male  Other Topics Concern   Not on file  Social History Narrative   NO REG EXERCISE   Caffeine use: daily   Social Determinants of Health   Financial Resource Strain: Low Risk  (09/05/2020)   Overall Financial Resource Strain (CARDIA)    Difficulty of Paying Living Expenses: Not hard at all  Food Insecurity: No Food Insecurity (09/05/2020)   Hunger Vital Sign    Worried About Running Out of Food in the Last Year: Never true    Ran Out of Food in the Last Year: Never true  Transportation Needs: No Transportation Needs (09/05/2020)   PRAPARE - Administrator, Civil Service (Medical): No    Lack of Transportation (Non-Medical): No  Physical Activity: Inactive (09/05/2020)   Exercise Vital Sign    Days of Exercise per Week: 0 days    Minutes of Exercise per Session: 0 min  Stress: No  Stress Concern Present (09/05/2020)   [employer] of Occupational Health - Occupational Stress Questionnaire    Feeling of Stress : Only a little  Social Connections: Socially Isolated (09/05/2020)   Social Connection and Isolation Panel [NHANES]    Frequency of Communication with Friends and Family: More than three times a week    Frequency of Social Gatherings with Friends and Family: More than three times a week    Attends Religious Services: Never    Database administrator or Organizations: No    Attends Banker Meetings: Never    Marital Status: Widowed   Allergies  Allergen Reactions   Bee Venom     Unknown   Family History  Problem Relation Age of Onset   Hypertension Mother    Alzheimer's disease Mother     Hyperlipidemia Mother    Heart disease Father        cad   Asthma Father    Cancer Father 40       leukemia   Stroke Father    Hypertension Father    Heart disease Sister 17       MI   Pulmonary fibrosis Sister    Arthritis Brother    Heart disease Maternal Uncle    Stroke Maternal Uncle    Uterine cancer Paternal Grandmother    Stomach cancer Paternal Grandfather    Heart disease Maternal Uncle    Heart disease Maternal Uncle    Cancer Maternal Aunt        leukemia     Current Outpatient Medications (Cardiovascular):    atorvastatin (LIPITOR) 40 MG tablet, TAKE 1 TABLET (40 MG TOTAL) BY MOUTH DAILY. PT NEEDS OV FOR FURTHER REFILLS (Patient taking differently: Take 1 tablet by mouth every evening.)   carvedilol (COREG) 6.25 MG tablet, TAKE 1.5 TABLETS (9.375 MG TOTAL) BY MOUTH 2 (TWO) TIMES DAILY WITH A MEAL. (Patient taking differently: Take 6.25 mg by mouth 2 (two) times daily with a meal.)   torsemide (DEMADEX) 20 MG tablet, Take 1 tablet (20 mg total) by mouth daily.  Current Outpatient Medications (Respiratory):    ipratropium (ATROVENT) 0.06 % nasal spray, Place 2 sprays into both nostrils in the morning and at bedtime.  Current Outpatient Medications (Analgesics):    Acetaminophen 500 MG capsule, Take 1 capsule by mouth every 8 (eight) hours as needed for pain.   HYDROcodone-acetaminophen (NORCO/VICODIN) 5-325 MG tablet, Take 1 tablet by mouth every 4 (four) hours.   Current Outpatient Medications (Other):    busPIRone (BUSPAR) 15 MG tablet, Take 1 tablet (15 mg total) by mouth 3 (three) times daily. Pt needs OV for further refills   Cholecalciferol 25 MCG (1000 UT) tablet, Take 1 tablet by mouth daily.   diclofenac Sodium (VOLTAREN) 1 % GEL, Apply 4 g topically 4 (four) times daily as needed (knee pain).   docusate sodium (COLACE) 100 MG capsule, Take 100 mg by mouth 2 (two) times daily.   DULoxetine (CYMBALTA) 30 MG capsule, Take 30 mg by mouth daily.   gabapentin  (NEURONTIN) 100 MG capsule, Take 1 capsule (100 mg total) by mouth 2 (two) times daily.   Infant Care Products (DERMACLOUD) OINT, Apply 1 Application topically daily.   loperamide (IMODIUM A-D) 2 MG tablet, Take 2 mg by mouth 4 (four) times daily as needed for diarrhea or loose stools.   metoCLOPramide (REGLAN) 5 MG tablet, Take 5 mg by mouth daily.   Multiple Vitamin (MULTI-VITAMIN) tablet, Take 1 tablet by  mouth daily.   ondansetron (ZOFRAN) 4 MG tablet, Take 4 mg by mouth every 6 (six) hours as needed for nausea or vomiting.   pantoprazole (PROTONIX) 40 MG tablet, TAKE 1 TABLET BY MOUTH EVERY DAY (Patient taking differently: Take 40 mg by mouth daily.)   potassium chloride (KLOR-CON M) 10 MEQ tablet, Take 1 tablet (10 mEq total) by mouth daily.   pramipexole (MIRAPEX) 1 MG tablet, TAKE 1 TABLET BY MOUTH AT BEDTIME. (Patient taking differently: Take 1 mg by mouth at bedtime.)   Sennosides-Docusate Sodium 8.6-50 MG CAPS, Take 1 tablet by mouth daily.   sertraline (ZOLOFT) 50 MG tablet, TAKE 1 AND 1/2 TABLETS DAILY BY MOUTH (Patient taking differently: Take 75 mg by mouth at bedtime.)   traZODone (DESYREL) 50 MG tablet, Take 50 mg by mouth at bedtime.   Reviewed prior external information including notes and imaging from  primary care provider As well as notes that were available from care everywhere and other healthcare systems.  Past medical history, social, surgical and family history all reviewed in electronic medical record.  No pertanent information unless stated regarding to the chief complaint.   Review of Systems:  No headache, visual changes, nausea, vomiting, diarrhea, constipation, dizziness, abdominal pain, skin rash, fevers, chills, night sweats, weight loss, swollen lymph nodes, body aches, joint swelling, chest pain, shortness of breath, mood changes. POSITIVE muscle aches  Objective  There were no vitals taken for this visit.   General: No apparent distress alert but not  oriented HEENT: Pupils equal, extraocular movements intact  Respiratory: Patient's speak in full sentences and does not appear short of breath  Patient has pain out of proportion to the amount of palpation.  After informed written and verbal consent, patient was seated on exam table. Right knee was prepped with alcohol swab and utilizing anterolateral approach, patient's right knee space was injected with 4:1  marcaine 0.5%: Kenalog 40mg /dL. Patient tolerated the procedure well without immediate complications.  After informed written and verbal consent, patient was seated on exam table. Left knee was prepped with alcohol swab and utilizing anterolateral approach, patient's left knee space was injected with 4:1  marcaine 0.5%: Kenalog 40mg /dL. Patient tolerated the procedure well without immediate complications.    Impression and Recommendations:     The above documentation has been reviewed and is accurate and complete Lyndal Pulley, DO

## 2022-04-26 NOTE — Progress Notes (Signed)
[facility]  [address] [place],  [place]  [address] [phone]  Clinic Day:  04/28/22    Referring physician: Harlen Labs, NP   CHIEF COMPLAINT:  CC:  Myelofibrosis  Current Treatment:  Surveillance   HISTORY OF PRESENT ILLNESS:  Erin [name] is a 77 y.o. female who I saw many years ago for thrombocytosis and she was referred back to me in July of 2018 for thrombocytopenia.  She has also been found to have anemia and both her hemoglobin and platelet count have been decreasing.  Her hemoglobin was down to 9.8 in July 2018 with a platelet count of 74,000. Her white count was elevated at 13,000 with 81% neutrophils, 8% lymphocytes, 9% monocytes, 1% eosinophils, and 1% basophils.  However, when I rechecked these counts on July 26th, I found more of a left shift and her white count was up to 16.6.  Her hemoglobin was a little better at 11.2 with an MCV of 84 and platelet count of 83,000. This time her differential revealed 61% neutrophils, 16% bands, 6% lymphocytes, 3% monocytes, 1% basophils, 5% metamyelocytes, 6% myelocytes and 2% blasts.  A reticulocyte count was 5.4%, sed rate was 93 and LDH was markedly elevated at 1939.  No monoclonal spike was seen.  I reviewed her peripheral blood smear myself and did confirm a rare blast and promyelocyte.  She was understandably anxious since her father had some form of leukemia at age 69 and lived 5 years.  She thinks this may have been chronic myelogenous leukemia.  An aunt also had leukemia.  She does note dyspnea with exertion.  She does see a nephrologist in [name] with [facility] for chronic kidney disease, Toye Lufadeju, MD.  She also has venous stasis and has wraps on her legs.  She complains of pain of her lower back and legs at a 6/10.  When I saw her in August 2018, her white count was still elevated at 14.5 with 45% neutrophils, 19% bands, 9% lymphocytes, 1% monocytes, 2% eosinophils, 2%  basophils, 8% metamyelocytes, 14% myelocytes and 1 nucleated red blood cell per 100 WBC's.  Her hemoglobin was stable at 11.3 but her platelet count was mildly lower at 74,000 and a smear revealed occasional teardrops.  JAK 2 mutation was negative.  We therefore performed a bone marrow on August 22nd and this was consistent with myelofibrosis.  The cytogenetics revealed normal 55 XX chromosomes and PCR for bcr/ABL was negative.  The sample was very limited as the aspirate was mainly blood.  There was a predominance of granulocytes, red cells and occasional atypical megakaryocytes associated with reticulin fibrosis, for a diagnosis of myelofibrosis.  Since that time she has had a stroke, and she has worsening vascular dementia.    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 This has caused her blood counts to be chronically low, and does not require treatment at this time. I would only transfuse her if her hemoglobin drops to 7.5 or less.   INTERVAL HISTORY:  Erin [name] is here for routine follow up for Myelofibrosis. Patient states that she wants "to go home" and complains of where she currently lives. She had been in assisted living and is now in a nursing home facility but receiving good care and her daughter shows me documentation of that on her phone. Living there for her causes misery and depression, she states that the workers sound verbally abusive and non caring. She continues to state  that she wants to live somewhere else. I tried to give her options to cope but she is adamant on leaving. Her daughter states that they do planned activities with her and her Monday-Friday nurse keeps her updated and states that she is doing well there. She also informed me that her mom believes there are people dying there and wants her daughter to kill her today. She is very talkative and paranoid and has a totally different mental status than her usual. Her daughter believes that her mental  state is worsening. Her birthday was 04/20/2022 and she cannot recall anything that happened that day. She continues to complain about her living situation and depression throughout the whole visit. Patient was taking Zoloft over the years and was switched to Cymbalta 30mg . She is now back on Zoloft with Buspar. She is currently taking her Xanax before bed as well as Trazodone. We discussed medication options for this but do not want to prescribe her with anything that will make her sleep all day. I think this is a reasonable regimen but she still needs psychiatric help and I'm not sure how to help her cope. Her WBC is 8.8 with 85% polys, I will schedule her for a urine culture but her lungs are clear. Her other chemistries are normal and show no sign of concern. I will see her back in 6 months with CBC and CMP. She denies signs of infection such as sore throat, sinus drainage, or cough.  She denies fevers or recurrent chills. She denies pain. She denies nausea, vomiting, chest pain, dyspnea or cough.  She was not weighed today.  She is accompanied today by her daughter.   REVIEW OF SYSTEMS:  Review of Systems  Constitutional:  Positive for fatigue. Negative for appetite change, chills, diaphoresis, fever and unexpected weight change.       Hair thinning  HENT:  Negative.  Negative for hearing loss, lump/mass, mouth sores, nosebleeds, sore throat, tinnitus, trouble swallowing and voice change.   Eyes: Negative.  Negative for eye problems and icterus.  Respiratory: Negative.  Negative for chest tightness, cough, hemoptysis, shortness of breath and wheezing.   Cardiovascular:  Negative for chest pain, leg swelling (bilateral, severe) and palpitations.  Gastrointestinal: Negative.  Negative for abdominal distention, abdominal pain, blood in stool, constipation, diarrhea, nausea, rectal pain and vomiting.  Endocrine: Negative.   Genitourinary: Negative.  Negative for bladder incontinence, difficulty  urinating, dyspareunia, dysuria, frequency, hematuria, menstrual problem, nocturia, pelvic pain, vaginal bleeding and vaginal discharge.   Musculoskeletal:  Positive for arthralgias, back pain and gait problem (in a wheelchair). Negative for flank pain, myalgias, neck pain and neck stiffness.  Skin: Negative.  Negative for itching, rash and wound.  Neurological:  Positive for gait problem (in a wheelchair). Negative for dizziness, extremity weakness, headaches, light-headedness, numbness, seizures and speech difficulty.  Hematological: Negative.  Negative for adenopathy. Does not bruise/bleed easily.  Psychiatric/Behavioral:  Positive for confusion, depression, sleep disturbance and suicidal ideas. Negative for decreased concentration. The patient is nervous/anxious.      VITALS:  Blood pressure (!) 209/86, pulse 78, temperature 98.8 F (37.1 C), temperature source Oral, resp. rate 18, SpO2 96 %.  Wt Readings from Last 3 Encounters:  02/12/22 225 lb 1.4 oz (102.1 kg)  10/07/20 220 lb (99.8 kg)  08/26/20 215 lb 3.2 oz (97.6 kg)    There is no height or weight on file to calculate BMI.  Performance status (ECOG): 2 - Symptomatic, <50% confined to bed  PHYSICAL EXAM:  Physical Exam Vitals and nursing note reviewed. Exam conducted with a chaperone present.  Constitutional:      General: She is in acute distress.     Appearance: Normal appearance. She is normal weight. She is not ill-appearing, toxic-appearing or diaphoretic.  HENT:     Head: Normocephalic and atraumatic.     Comments: Hair thinning    Right Ear: Tympanic membrane, ear canal and external ear normal. There is no impacted cerumen.     Left Ear: Tympanic membrane, ear canal and external ear normal. There is no impacted cerumen.     Nose: Nose normal. No congestion or rhinorrhea.     Mouth/Throat:     Mouth: Mucous membranes are moist.     Pharynx: Oropharynx is clear. No oropharyngeal exudate or posterior oropharyngeal  erythema.  Eyes:     General: No scleral icterus.       Right eye: No discharge.        Left eye: No discharge.     Extraocular Movements: Extraocular movements intact.     Conjunctiva/sclera: Conjunctivae normal.     Pupils: Pupils are equal, round, and reactive to light.  Neck:     Vascular: No carotid bruit.  Cardiovascular:     Rate and Rhythm: Normal rate and regular rhythm.     Pulses: Normal pulses.     Heart sounds: Normal heart sounds. No murmur heard.    No friction rub. No gallop.  Pulmonary:     Effort: Pulmonary effort is normal. No respiratory distress.     Breath sounds: Normal breath sounds. No stridor. No wheezing, rhonchi or rales.  Chest:     Chest wall: No tenderness.  Abdominal:     General: Bowel sounds are normal. There is no distension.     Palpations: Abdomen is soft. There is no hepatomegaly, splenomegaly or mass.     Tenderness: There is no abdominal tenderness. There is no right CVA tenderness, left CVA tenderness, guarding or rebound.     Hernia: No hernia is present.  Musculoskeletal:        General: Swelling present. No tenderness, deformity or signs of injury. Normal range of motion.     Cervical back: Normal range of motion and neck supple. No rigidity or tenderness.     Right lower leg: Edema (2-3+, with severe venous changes.) present.     Left lower leg: Edema (2-3+, with severe venous changes.) present.     Comments: Severe bilateral of the lower extremitiy edema 2+ edema with chronic venous stasis changes  Lymphadenopathy:     Cervical: No cervical adenopathy.  Skin:    General: Skin is warm and dry.     Coloration: Skin is not jaundiced or pale.     Findings: No bruising, erythema, lesion or rash.  Neurological:     General: No focal deficit present.     Mental Status: She is alert and oriented to person, place, and time. Mental status is at baseline.     Cranial Nerves: No cranial nerve deficit.     Sensory: No sensory deficit.      Motor: No weakness.     Coordination: Coordination normal.     Gait: Gait normal.     Deep Tendon Reflexes: Reflexes normal.  Psychiatric:        Mood and Affect: Mood is anxious and depressed.        Speech: Speech is rapid and pressured.  Thought Content: Thought content is paranoid. Thought content does not include suicidal ideation.        Cognition and Memory: Cognition is impaired. She exhibits impaired recent memory.        Judgment: Judgment is not inappropriate.     Comments: Paranoia, distress, confusion, much different then her baseline mental status.      LABS:      Latest Ref Rng & Units 04/28/2022   12:00 AM 02/13/2022    3:51 AM 02/12/2022    3:56 AM  CBC  WBC  8.8     4.9  4.1   Hemoglobin 12.0 - 16.0 10.6     9.3  8.6   Hematocrit 36 - 46 32     29.5  26.7   Platelets 150 - 400 K/uL 98     52  47      This result is from an external source.      Latest Ref Rng & Units 04/28/2022   12:00 AM 02/13/2022    3:51 AM 02/12/2022    4:13 PM  CMP  Glucose 70 - 99 mg/dL  99  794   BUN 4 - 21 31     33  43   Creatinine 0.5 - 1.1 0.9     0.94  0.97   Sodium 137 - 147 136     142  140   Potassium 3.5 - 5.1 mEq/L 4.3     4.1  4.0   Chloride 99 - 108 103     108  108   CO2 13 - 22 26     24  26    Calcium 8.7 - 10.7 9.1     8.7  7.9   Alkaline Phos 25 - 125 115        AST 13 - 35 38        ALT 7 - 35 U/L 44           This result is from an external source.     No results found for: "TOTALPROTELP", "ALBUMINELP", "A1GS", "A2GS", "BETS", "BETA2SER", "GAMS", "MSPIKE", "SPEI" Lab Results  Component Value Date   TIBC 236 (L) 12/24/2021   TIBC 299 04/27/2021   TIBC 290 04/27/2021   FERRITIN 263 12/24/2021   FERRITIN 108 04/27/2021   FERRITIN 98 04/27/2021   IRONPCTSAT 29 12/24/2021   IRONPCTSAT 20 04/27/2021   IRONPCTSAT 19 04/27/2021   Lab Results  Component Value Date   LDH 1,016 (A) 04/03/2020   Component Ref Range & Units 2 mo ago (02/13/22) 2 mo  ago (02/13/22) 2 mo ago (02/13/22) 2 mo ago (02/12/22) 2 mo ago (02/12/22)  Glucose-Capillary 70 - 99 mg/dL 13/10/23 High  94 CM 93 CM 327 High  CM 117 High    omponent Ref Range & Units 2 mo ago (02/13/22) 2 mo ago (02/12/22) 2 mo ago (02/11/22)  Magnesium 1.7 - 2.4 mg/dL 2.3 2.1 CM 2.0 CM     STUDIES:  Exam: 03/19/2022 XA DG INJECT/THERA INC NEEDLE/CATH/PLC EPI/LUMB/SAC W/IMG  IMPRESSION: Technically successful interlaminar epidural injection on the right at L5-S1.    EXAM:11/19/2021 11/21/2021 LIMITED JOINT SPACE STRUCTURES LOW RIGHT IMPRESSION: Limited muscular skeletal ultrasound was performed and interpreted by [place], M  Limited ultrasound shows that there may be a very small hypoechoic mass noted in the popliteal area but appears to be potentially ruptured with some drainage into the medial gastrocnemius area.  Mild pain with compression. Impression: Likely ruptured Baker's  cyst   HISTORY:   Allergies:  Allergies  Allergen Reactions   Bee Venom     Unknown    Current Medications: Current Outpatient Medications  Medication Sig Dispense Refill   Acetaminophen 500 MG capsule Take 1 capsule by mouth every 8 (eight) hours as needed for pain.     atorvastatin (LIPITOR) 40 MG tablet TAKE 1 TABLET (40 MG TOTAL) BY MOUTH DAILY. PT NEEDS OV FOR FURTHER REFILLS (Patient taking differently: Take 1 tablet by mouth every evening.) 90 tablet 1   busPIRone (BUSPAR) 15 MG tablet Take 1 tablet (15 mg total) by mouth 3 (three) times daily. Pt needs OV for further refills 270 tablet 0   carvedilol (COREG) 6.25 MG tablet TAKE 1.5 TABLETS (9.375 MG TOTAL) BY MOUTH 2 (TWO) TIMES DAILY WITH A MEAL. (Patient taking differently: Take 6.25 mg by mouth 2 (two) times daily with a meal.) 170 tablet 1   Cholecalciferol 25 MCG (1000 UT) tablet Take 1 tablet by mouth daily.     diclofenac Sodium (VOLTAREN) 1 % GEL Apply 4 g topically 4 (four) times daily as needed (knee pain).     docusate sodium  (COLACE) 100 MG capsule Take 100 mg by mouth 2 (two) times daily.     DULoxetine (CYMBALTA) 30 MG capsule Take 30 mg by mouth daily.     gabapentin (NEURONTIN) 100 MG capsule Take 1 capsule (100 mg total) by mouth 2 (two) times daily.     HYDROcodone-acetaminophen (NORCO/VICODIN) 5-325 MG tablet Take 1 tablet by mouth every 4 (four) hours. 5 tablet 0   Infant Care Products (DERMACLOUD) OINT Apply 1 Application topically daily.     ipratropium (ATROVENT) 0.06 % nasal spray Place 2 sprays into both nostrils in the morning and at bedtime.     loperamide (IMODIUM A-D) 2 MG tablet Take 2 mg by mouth 4 (four) times daily as needed for diarrhea or loose stools.     metoCLOPramide (REGLAN) 5 MG tablet Take 5 mg by mouth daily.     Multiple Vitamin (MULTI-VITAMIN) tablet Take 1 tablet by mouth daily.     ondansetron (ZOFRAN) 4 MG tablet Take 4 mg by mouth every 6 (six) hours as needed for nausea or vomiting.     pantoprazole (PROTONIX) 40 MG tablet TAKE 1 TABLET BY MOUTH EVERY DAY (Patient taking differently: Take 40 mg by mouth daily.) 90 tablet 1   potassium chloride (KLOR-CON M) 10 MEQ tablet Take 1 tablet (10 mEq total) by mouth daily.     pramipexole (MIRAPEX) 1 MG tablet TAKE 1 TABLET BY MOUTH AT BEDTIME. (Patient taking differently: Take 1 mg by mouth at bedtime.) 90 tablet 1   Sennosides-Docusate Sodium 8.6-50 MG CAPS Take 1 tablet by mouth daily.     sertraline (ZOLOFT) 50 MG tablet TAKE 1 AND 1/2 TABLETS DAILY BY MOUTH (Patient taking differently: Take 75 mg by mouth at bedtime.) 135 tablet 1   torsemide (DEMADEX) 20 MG tablet Take 1 tablet (20 mg total) by mouth daily.     traZODone (DESYREL) 50 MG tablet Take 50 mg by mouth at bedtime.     No current facility-administered medications for this visit.     ASSESSMENT & PLAN:   Assessment:   1.  Myelofibrosis, diagnosed in August 2018. This likely will not threaten her life, and so only routine surveillance is recommended.  She has  pancytopenia, but her counts will chronically be low and there is no specific therapy for her. Her blood  counts are better than usual.  2. Altered mental status and that she is anxious and paranoid. They are adjusting her medications and she clearly needs psychiatric help.  She is perseverating and very talkative.  3. Hypertension, her BP is 209/86 but this is due to her agitated mental state. They will be monitoring her BP at [facility].  4. Severe lower extremity edema.   5. Vascular dementia. She seems to be doing fairly well today with her mental status and speech.  6. Even though her white count is normal, this is elevated for her with a left shift of increased neutrophils. I therefore recommend checking for infection and will recommend a urinalysis and culture.  Plan: Her WBC is 8.8 with 85% polys, I will schedule her for a urine culture but her lungs are clear. Her other chemistries are normal and show no sign of concern. I will see her back in 6 months with CBC and CMP.  At this time, we will continue surveillance to monitor her blood counts.   She and her daughter understand and agree with this plan of care.  I provided 35 minutes of face-to-face time during this this encounter and > 50% was spent counseling as documented under my assessment and plan.    Dellia Beckwith, MD [facility] [address] [place] [address] Dept: [phone] Dept Fax: [phone]    Rulon Sera Lassiter,acting as a scribe for Dellia Beckwith, MD.,have documented all relevant documentation on the behalf of Dellia Beckwith, MD,as directed by  Dellia Beckwith, MD while in the presence of Dellia Beckwith, MD.

## 2022-04-27 ENCOUNTER — Ambulatory Visit (INDEPENDENT_AMBULATORY_CARE_PROVIDER_SITE_OTHER): Payer: Medicare Other | Admitting: Family Medicine

## 2022-04-27 VITALS — BP 128/80 | HR 91 | Ht 62.0 in

## 2022-04-27 DIAGNOSIS — M170 Bilateral primary osteoarthritis of knee: Secondary | ICD-10-CM

## 2022-04-27 NOTE — Patient Instructions (Signed)
Good to see you!  See you again in 3 months

## 2022-04-28 ENCOUNTER — Inpatient Hospital Stay: Payer: Medicare Other

## 2022-04-28 ENCOUNTER — Other Ambulatory Visit: Payer: Self-pay | Admitting: Oncology

## 2022-04-28 ENCOUNTER — Inpatient Hospital Stay: Payer: Medicare Other | Attending: Oncology | Admitting: Oncology

## 2022-04-28 ENCOUNTER — Encounter: Payer: Self-pay | Admitting: Family Medicine

## 2022-04-28 ENCOUNTER — Encounter: Payer: Self-pay | Admitting: Oncology

## 2022-04-28 ENCOUNTER — Telehealth: Payer: Self-pay | Admitting: Oncology

## 2022-04-28 VITALS — BP 209/86 | HR 78 | Temp 98.8°F | Resp 18

## 2022-04-28 DIAGNOSIS — D473 Essential (hemorrhagic) thrombocythemia: Secondary | ICD-10-CM | POA: Diagnosis not present

## 2022-04-28 DIAGNOSIS — D509 Iron deficiency anemia, unspecified: Secondary | ICD-10-CM | POA: Diagnosis not present

## 2022-04-28 DIAGNOSIS — D474 Osteomyelofibrosis: Secondary | ICD-10-CM | POA: Diagnosis not present

## 2022-04-28 LAB — BASIC METABOLIC PANEL
BUN: 31 — AB (ref 4–21)
CO2: 26 — AB (ref 13–22)
Chloride: 103 (ref 99–108)
Creatinine: 0.9 (ref 0.5–1.1)
Glucose: 162
Potassium: 4.3 mEq/L (ref 3.5–5.1)
Sodium: 136 — AB (ref 137–147)

## 2022-04-28 LAB — CBC: RBC: 3.63 — AB (ref 3.87–5.11)

## 2022-04-28 LAB — CBC AND DIFFERENTIAL
HCT: 32 — AB (ref 36–46)
Hemoglobin: 10.6 — AB (ref 12.0–16.0)
Neutrophils Absolute: 7.04
Platelets: 98 10*3/uL — AB (ref 150–400)
WBC: 8.8

## 2022-04-28 LAB — COMPREHENSIVE METABOLIC PANEL
Albumin: 3.8 (ref 3.5–5.0)
Calcium: 9.1 (ref 8.7–10.7)
eGFR: >60

## 2022-04-28 LAB — HEPATIC FUNCTION PANEL
ALT: 44 U/L — AB (ref 7–35)
AST: 38 — AB (ref 13–35)
Alkaline Phosphatase: 115 (ref 25–125)
Bilirubin, Total: 0.9

## 2022-04-28 NOTE — Assessment & Plan Note (Signed)
Chronic, with worsening symptoms.  Still has significant difficulty secondary to patient's other comorbidities.  Patient is social determinant of health includes dementia as well as not very mobile.  We discussed with patient as well as patient's caretaker about doing the use intermittently.  We discussed icing regimen and home exercises, increase activity slowly.  Follow-up again in 10 weeks

## 2022-04-28 NOTE — Telephone Encounter (Signed)
04/28/22 Next appt scheduled and confirmed with patient

## 2022-07-26 NOTE — Progress Notes (Unsigned)
[name] [address] [place] [address] Phone: [phone] Subjective:   Bruce Donath, am serving as a scribe for Dr. Antoine Primas.  I'm seeing this patient by the request  of:  Garlan Fillers, MD  CC: Bilateral knee pain  BJY:[id number]  04/27/2022 Chronic, with worsening symptoms.  Still has significant difficulty secondary to patient's other comorbidities.  Patient is social determinant of health includes dementia as well as not very mobile.  We discussed with patient as well as patient's caretaker about doing the use intermittently.  We discussed icing regimen and home exercises, increase activity slowly.  Follow-up again in 10 weeks   Updated 07/27/2022 Erin [name] is a 77 y.o. female coming in with complaint of B knee pain. Patient is here for knee injections.     Past Medical History:  Diagnosis Date   Asthma    CHF (congestive heart failure) (HCC)    hospital 11/2013-- [place]   Chronic kidney disease    Depression    Diabetes mellitus (HCC)    GERD (gastroesophageal reflux disease)    Hyperlipidemia    Hypertension    Stroke [facility])    Past Surgical History:  Procedure Laterality Date   CHOLECYSTECTOMY  2002   LOOP RECORDER INSERTION N/A 08/01/2017   Procedure: LOOP RECORDER INSERTION;  Surgeon: Regan Lemming, MD;  Location: [facility];  Service: Cardiovascular;  Laterality: N/A;   SPINE SURGERY  aug 2015   [facility]   TEE WITHOUT CARDIOVERSION N/A 08/01/2017   Procedure: TRANSESOPHAGEAL ECHOCARDIOGRAM (TEE);  Surgeon: Thurmon Fair, MD;  Location: [facility] ENDOSCOPY;  Service: Cardiovascular;  Laterality: N/A;   TONSILLECTOMY     TUBAL LIGATION     Social History   Socioeconomic History   Marital status: Widowed    Spouse name: Not on file   Number of children: 4   Years of education: 36   Highest education level: 12th grade  Occupational History   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, materials: [employer]  Tobacco Use   Smoking status: Former    Packs/day: 1.50    Years: 20.00    Additional pack years: 0.00    Total pack years: 30.00    Types: Cigarettes    Quit date: 08/02/1984    Years since quitting: 38.0   Smokeless tobacco: Never  Vaping Use   Vaping Use: Never used  Substance and Sexual Activity   Alcohol use: No   Drug use: No   Sexual activity: Not Currently    Partners: Male  Other Topics Concern   Not on file  Social History Narrative   NO REG EXERCISE   Caffeine use: daily   Social Determinants of Health   Financial Resource Strain: Low Risk  (09/05/2020)   Overall Financial Resource Strain (CARDIA)    Difficulty of Paying Living Expenses: Not hard at all  Food Insecurity: No Food Insecurity (09/05/2020)   Hunger Vital Sign    Worried About Running Out of Food in the Last Year: Never true    Ran Out of Food in the Last Year: Never true  Transportation Needs: No Transportation Needs (09/05/2020)   PRAPARE - Administrator, Civil Service (Medical): No    Lack of Transportation (Non-Medical): No  Physical Activity: Inactive (09/05/2020)   Exercise Vital Sign    Days of Exercise per Week: 0 days    Minutes of Exercise per Session:  0 min  Stress: No Stress Concern Present (09/05/2020)   [employer] of Occupational Health - Occupational Stress Questionnaire    Feeling of Stress : Only a little  Social Connections: Socially Isolated (09/05/2020)   Social Connection and Isolation Panel [NHANES]    Frequency of Communication with Friends and Family: More than three times a week    Frequency of Social Gatherings with Friends and Family: More than three times a week    Attends Religious Services: Never    Database administrator or Organizations: No    Attends Banker Meetings: Never    Marital Status: Widowed   Allergies  Allergen Reactions   Bee Venom     Unknown   Family History  Problem Relation Age of Onset    Hypertension Mother    Alzheimer's disease Mother    Hyperlipidemia Mother    Heart disease Father        cad   Asthma Father    Cancer Father 50       leukemia   Stroke Father    Hypertension Father    Heart disease Sister 11       MI   Pulmonary fibrosis Sister    Arthritis Brother    Heart disease Maternal Uncle    Stroke Maternal Uncle    Uterine cancer Paternal Grandmother    Stomach cancer Paternal Grandfather    Heart disease Maternal Uncle    Heart disease Maternal Uncle    Cancer Maternal Aunt        leukemia     Current Outpatient Medications (Cardiovascular):    atorvastatin (LIPITOR) 40 MG tablet, TAKE 1 TABLET (40 MG TOTAL) BY MOUTH DAILY. PT NEEDS OV FOR FURTHER REFILLS (Patient taking differently: Take 1 tablet by mouth every evening.)   carvedilol (COREG) 6.25 MG tablet, TAKE 1.5 TABLETS (9.375 MG TOTAL) BY MOUTH 2 (TWO) TIMES DAILY WITH A MEAL. (Patient taking differently: Take 6.25 mg by mouth 2 (two) times daily with a meal.)   torsemide (DEMADEX) 20 MG tablet, Take 1 tablet (20 mg total) by mouth daily.  Current Outpatient Medications (Respiratory):    ipratropium (ATROVENT) 0.06 % nasal spray, Place 2 sprays into both nostrils in the morning and at bedtime.  Current Outpatient Medications (Analgesics):    Acetaminophen 500 MG capsule, Take 1 capsule by mouth every 8 (eight) hours as needed for pain.   HYDROcodone-acetaminophen (NORCO/VICODIN) 5-325 MG tablet, Take 1 tablet by mouth every 4 (four) hours.   Current Outpatient Medications (Other):    busPIRone (BUSPAR) 15 MG tablet, Take 1 tablet (15 mg total) by mouth 3 (three) times daily. Pt needs OV for further refills   Cholecalciferol 25 MCG (1000 UT) tablet, Take 1 tablet by mouth daily.   diclofenac Sodium (VOLTAREN) 1 % GEL, Apply 4 g topically 4 (four) times daily as needed (knee pain).   docusate sodium (COLACE) 100 MG capsule, Take 100 mg by mouth 2 (two) times daily.   DULoxetine (CYMBALTA) 30  MG capsule, Take 30 mg by mouth daily.   gabapentin (NEURONTIN) 100 MG capsule, Take 1 capsule (100 mg total) by mouth 2 (two) times daily.   Infant Care Products (DERMACLOUD) OINT, Apply 1 Application topically daily.   loperamide (IMODIUM A-D) 2 MG tablet, Take 2 mg by mouth 4 (four) times daily as needed for diarrhea or loose stools.   metoCLOPramide (REGLAN) 5 MG tablet, Take 5 mg by mouth daily.   Multiple Vitamin (MULTI-VITAMIN)  tablet, Take 1 tablet by mouth daily.   ondansetron (ZOFRAN) 4 MG tablet, Take 4 mg by mouth every 6 (six) hours as needed for nausea or vomiting.   pantoprazole (PROTONIX) 40 MG tablet, TAKE 1 TABLET BY MOUTH EVERY DAY (Patient taking differently: Take 40 mg by mouth daily.)   potassium chloride (KLOR-CON M) 10 MEQ tablet, Take 1 tablet (10 mEq total) by mouth daily.   pramipexole (MIRAPEX) 1 MG tablet, TAKE 1 TABLET BY MOUTH AT BEDTIME. (Patient taking differently: Take 1 mg by mouth at bedtime.)   Sennosides-Docusate Sodium 8.6-50 MG CAPS, Take 1 tablet by mouth daily.   sertraline (ZOLOFT) 50 MG tablet, TAKE 1 AND 1/2 TABLETS DAILY BY MOUTH (Patient taking differently: Take 75 mg by mouth at bedtime.)   traZODone (DESYREL) 50 MG tablet, Take 50 mg by mouth at bedtime.     Objective  Blood pressure (!) 140/82, pulse 90, height  (1.575 m), SpO2 95 %.   General: No apparent distress alert but not oriented. Patient is sitting in a wheelchair. Pitting edema of the lower extremities noted. Patient does have some cellulitic changes noted at the lower extremity of the right calf.  Mild warmth to palpation.  Does not extend into the knee.  Marked laterally as well.  Left knee tender to palpation noted diffusely.   After informed written and verbal consent, patient was seated on exam table. Left knee was prepped with alcohol swab and utilizing anterolateral approach, patient's left knee space was injected with 4:1  marcaine 0.5%: Kenalog /dL. Patient  tolerated the procedure well without immediate complications.  After informed written and verbal consent, patient was seated on exam table. Right knee was prepped with alcohol swab and utilizing anterior medial approach, patient's right knee space was injected with 4:1  marcaine 0.5%: Kenalog /dL. Patient tolerated the procedure well without immediate complications.    Impression and Recommendations:     The above documentation has been reviewed and is accurate and complete Judi Saa, DO

## 2022-07-27 ENCOUNTER — Encounter: Payer: Self-pay | Admitting: Family Medicine

## 2022-07-27 ENCOUNTER — Ambulatory Visit (INDEPENDENT_AMBULATORY_CARE_PROVIDER_SITE_OTHER): Payer: Medicare Other | Admitting: Family Medicine

## 2022-07-27 VITALS — BP 140/82 | HR 90 | Ht 62.0 in

## 2022-07-27 DIAGNOSIS — M170 Bilateral primary osteoarthritis of knee: Secondary | ICD-10-CM

## 2022-07-27 DIAGNOSIS — L03115 Cellulitis of right lower limb: Secondary | ICD-10-CM

## 2022-07-27 NOTE — Patient Instructions (Addendum)
Looks like you have cellulitis Look at leg 2x a day See me again in 10 weeks

## 2022-07-27 NOTE — Assessment & Plan Note (Signed)
Recurrent, seeing another provider for this, was started on what sounds to be will be topical medications as well as oral.

## 2022-07-27 NOTE — Assessment & Plan Note (Signed)
Significant arthritic changes.  Secondary to patient's comorbidities she is not a candidate for any knee replacement.  Worsening pain noted.  Severe dementia noted.  Accompanied with family member.  Patient does have what appears to be a potential cellulitis noted of the right lower extremity and encouraged patient to continue the antibiotics.  Does have congestive heart failure and some chronic pitting edema of the lower extremities as well.  On the right knee went more medial on the injection to avoid the cellulitis.  Follow-up with me again in 10 to 12 weeks

## 2022-10-01 NOTE — Progress Notes (Unsigned)
Erin [name] [address] [place] [address] Phone: [phone] Subjective:   Erin [name], am serving as a scribe for Dr. Antoine [name].  I'm seeing this patient by the request  of:  Erin Fillers, MD  CC: Bilateral knee pain follow-up  BJY:[id number]  07/27/2022 Recurrent, seeing another provider for this, was started on what sounds to be will be topical medications as well as oral.   Significant arthritic changes. Secondary to patient's comorbidities she is not a candidate for any knee replacement. Worsening pain noted. Severe dementia noted. Accompanied with family member. Patient does have what appears to be a potential cellulitis noted of the right lower extremity and encouraged patient to continue the antibiotics. Does have congestive heart failure and some chronic pitting edema of the lower extremities as well. On the right knee went more medial on the injection to avoid the cellulitis. Follow-up with me again in 10 to 12 weeks   Updated 10/05/2022 Erin [name] is a 77 y.o. female coming in with complaint of B knee pain.  Has end-stage arthritic changes of the knees.  Secondary to comorbidities and patient's social determinants of health moving decreasing ambulatory aspect patient is having worsening pain.       Past Medical History:  Diagnosis Date   Asthma    CHF (congestive heart failure) (HCC)    hospital 11/2013-- [place]   Chronic kidney disease    Depression    Diabetes mellitus (HCC)    GERD (gastroesophageal reflux disease)    Hyperlipidemia    Hypertension    Stroke [facility])    Past Surgical History:  Procedure Laterality Date   CHOLECYSTECTOMY  2002   LOOP RECORDER INSERTION N/A 08/01/2017   Procedure: LOOP RECORDER INSERTION;  Surgeon: Regan Lemming, MD;  Location: [facility]AS[facility] LAB;  Service: Cardiovascular;  Laterality: N/A;   SPINE SURGERY  aug 2015   [facility]   TEE WITHOUT CARDIOVERSION  N/A 08/01/2017   Procedure: TRANSESOPHAGEAL ECHOCARDIOGRAM (TEE);  Surgeon: Thurmon Fair, MD;  Location: [facility] ENDOSCOPY;  Service: Cardiovascular;  Laterality: N/A;   TONSILLECTOMY     TUBAL LIGATION     Social History   Socioeconomic History   Marital status: Widowed    Spouse name: Not on file   Number of children: 4   Years of education: 66   Highest education level: 12th grade  Occupational History   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, materials: [employer]  Tobacco Use   Smoking status: Former    Packs/day: 1.50    Years: 20.00    Additional pack years: 0.00    Total pack years: 30.00    Types: Cigarettes    Quit date: 08/02/1984    Years since quitting: 38.2   Smokeless tobacco: Never  Vaping Use   Vaping Use: Never used  Substance and Sexual Activity   Alcohol use: No   Drug use: No   Sexual activity: Not Currently    Partners: Male  Other Topics Concern   Not on file  Social History Narrative   NO REG EXERCISE   Caffeine use: daily   Social Determinants of Health   Financial Resource Strain: Low Risk  (09/05/2020)   Overall Financial Resource Strain (CARDIA)    Difficulty of Paying Living Expenses: Not hard at all  Food Insecurity: No Food Insecurity (09/05/2020)   Hunger Vital Sign    Worried About Running Out of Food in  the Last Year: Never true    Ran Out of Food in the Last Year: Never true  Transportation Needs: No Transportation Needs (09/05/2020)   PRAPARE - Administrator, Civil Service (Medical): No    Lack of Transportation (Non-Medical): No  Physical Activity: Inactive (09/05/2020)   Exercise Vital Sign    Days of Exercise per Week: 0 days    Minutes of Exercise per Session: 0 min  Stress: No Stress Concern Present (09/05/2020)   [employer] of Occupational Health - Occupational Stress Questionnaire    Feeling of Stress : Only a little  Social Connections: Socially Isolated (09/05/2020)   Social Connection and Isolation Panel  [NHANES]    Frequency of Communication with Friends and Family: More than three times a week    Frequency of Social Gatherings with Friends and Family: More than three times a week    Attends Religious Services: Never    Database administrator or Organizations: No    Attends Banker Meetings: Never    Marital Status: Widowed   Allergies  Allergen Reactions   Bee Venom     Unknown   Family History  Problem Relation Age of Onset   Hypertension Mother    Alzheimer's disease Mother    Hyperlipidemia Mother    Heart disease Father        cad   Asthma Father    Cancer Father 70       leukemia   Stroke Father    Hypertension Father    Heart disease Sister 57       MI   Pulmonary fibrosis Sister    Arthritis Brother    Heart disease Maternal Uncle    Stroke Maternal Uncle    Uterine cancer Paternal Grandmother    Stomach cancer Paternal Grandfather    Heart disease Maternal Uncle    Heart disease Maternal Uncle    Cancer Maternal Aunt        leukemia     Current Outpatient Medications (Cardiovascular):    atorvastatin (LIPITOR) 40 MG tablet, TAKE 1 TABLET (40 MG TOTAL) BY MOUTH DAILY. PT NEEDS OV FOR FURTHER REFILLS (Patient taking differently: Take 1 tablet by mouth every evening.)   carvedilol (COREG) 6.25 MG tablet, TAKE 1.5 TABLETS (9.375 MG TOTAL) BY MOUTH 2 (TWO) TIMES DAILY WITH A MEAL. (Patient taking differently: Take 6.25 mg by mouth 2 (two) times daily with a meal.)   torsemide (DEMADEX) 20 MG tablet, Take 1 tablet (20 mg total) by mouth daily.  Current Outpatient Medications (Respiratory):    ipratropium (ATROVENT) 0.06 % nasal spray, Place 2 sprays into both nostrils in the morning and at bedtime.  Current Outpatient Medications (Analgesics):    Acetaminophen 500 MG capsule, Take 1 capsule by mouth every 8 (eight) hours as needed for pain.   HYDROcodone-acetaminophen (NORCO/VICODIN) 5-325 MG tablet, Take 1 tablet by mouth every 4 (four)  hours.   Current Outpatient Medications (Other):    busPIRone (BUSPAR) 15 MG tablet, Take 1 tablet (15 mg total) by mouth 3 (three) times daily. Pt needs OV for further refills   Cholecalciferol 25 MCG (1000 UT) tablet, Take 1 tablet by mouth daily.   diclofenac Sodium (VOLTAREN) 1 % GEL, Apply 4 g topically 4 (four) times daily as needed (knee pain).   docusate sodium (COLACE) 100 MG capsule, Take 100 mg by mouth 2 (two) times daily.   DULoxetine (CYMBALTA) 30 MG capsule, Take 30 mg by mouth  daily.   gabapentin (NEURONTIN) 100 MG capsule, Take 1 capsule (100 mg total) by mouth 2 (two) times daily.   Infant Care Products (DERMACLOUD) OINT, Apply 1 Application topically daily.   loperamide (IMODIUM A-D) 2 MG tablet, Take 2 mg by mouth 4 (four) times daily as needed for diarrhea or loose stools.   metoCLOPramide (REGLAN) 5 MG tablet, Take 5 mg by mouth daily.   Multiple Vitamin (MULTI-VITAMIN) tablet, Take 1 tablet by mouth daily.   ondansetron (ZOFRAN) 4 MG tablet, Take 4 mg by mouth every 6 (six) hours as needed for nausea or vomiting.   pantoprazole (PROTONIX) 40 MG tablet, TAKE 1 TABLET BY MOUTH EVERY DAY (Patient taking differently: Take 40 mg by mouth daily.)   potassium chloride (KLOR-CON M) 10 MEQ tablet, Take 1 tablet (10 mEq total) by mouth daily.   pramipexole (MIRAPEX) 1 MG tablet, TAKE 1 TABLET BY MOUTH AT BEDTIME. (Patient taking differently: Take 1 mg by mouth at bedtime.)   Sennosides-Docusate Sodium 8.6-50 MG CAPS, Take 1 tablet by mouth daily.   sertraline (ZOLOFT) 50 MG tablet, TAKE 1 AND 1/2 TABLETS DAILY BY MOUTH (Patient taking differently: Take 75 mg by mouth at bedtime.)   traZODone (DESYREL) 50 MG tablet, Take 50 mg by mouth at bedtime.   Reviewed prior external information including notes and imaging from  primary care provider As well as notes that were available from care everywhere and other healthcare systems.  Past medical history, social, surgical and family  history all reviewed in electronic medical record.  No pertanent information unless stated regarding to the chief complaint.   Review of Systems:  No headache, visual changes, nausea, vomiting, diarrhea, constipation, dizziness, abdominal pain, skin rash, fevers, chills, night sweats, weight loss, swollen lymph nodes, chest pain, shortness of breath, mood changes. POSITIVE muscle aches, joint swelling and body aches  Objective  Blood pressure 130/84, pulse 86, height 5\' 2"  (1.575 m), SpO2 96 %.   General: No apparent distress alert but not oriented, patient is accompanied with family member. HEENT: Pupils equal, extraocular movements intact  Bilaterally significant crepitus noted.  Significant limited range of motion secondary to patient's being in a wheelchair.  Does haveVoluntary guarding noted as well   after informed written and verbal consent, patient was seated on exam table. Right knee was prepped with alcohol swab and utilizing anterolateral approach, patient's right knee space was injected with 4:1  marcaine 0.5%: Kenalog 40mg /dL. Patient tolerated the procedure well without immediate complications.    After informed written and verbal consent, patient was seated on exam table. Left knee was prepped with alcohol swab and utilizing anterolateral approach, patient's left knee space was injected with 4:1  marcaine 0.5%: Kenalog 40mg /dL. Patient tolerated the procedure well without immediate complications.     Impression and Recommendations:    The above documentation has been reviewed and is accurate and complete Judi Saa, DO

## 2022-10-05 ENCOUNTER — Ambulatory Visit (INDEPENDENT_AMBULATORY_CARE_PROVIDER_SITE_OTHER): Payer: Medicare Other | Admitting: Family Medicine

## 2022-10-05 ENCOUNTER — Encounter: Payer: Self-pay | Admitting: Family Medicine

## 2022-10-05 VITALS — BP 130/84 | HR 86 | Ht 62.0 in

## 2022-10-05 DIAGNOSIS — M170 Bilateral primary osteoarthritis of knee: Secondary | ICD-10-CM

## 2022-10-05 NOTE — Assessment & Plan Note (Signed)
Chronic, with exacerbation.  Worsening symptoms.  Patient is almost nonambulatory but does need her knees to allow her to continue to help her with transitions and transferring at her nursing facility.  Still seems to help for some time now.  Due to patient's comorbidities is not a surgical candidate.  Follow-up with me again 10 weeks

## 2022-10-05 NOTE — Patient Instructions (Signed)
See me in 10-12 weeks 

## 2022-10-26 NOTE — Progress Notes (Signed)
[facility]  [address] [place],  [place]  [address] [phone]  Clinic Day: 10/27/22    Referring physician: Garlan Fillers, MD   CHIEF COMPLAINT:  CC:  Myelofibrosis  Current Treatment:  Surveillance   HISTORY OF PRESENT ILLNESS:  Erin [name] is a 77 y.o. female who I saw many years ago for thrombocytosis and she was referred back to me in July of 2018 for thrombocytopenia.  She has also been found to have anemia and both her hemoglobin and platelet count have been decreasing.  Her hemoglobin was down to 9.8 in July 2018 with a platelet count of 74,000. Her white count was elevated at 13,000 with 81% neutrophils, 8% lymphocytes, 9% monocytes, 1% eosinophils, and 1% basophils.  However, when I rechecked these counts on July 26th, I found more of a left shift and her white count was up to 16.6.  Her hemoglobin was a little better at 11.2 with an MCV of 84 and platelet count of 83,000. This time her differential revealed 61% neutrophils, 16% bands, 6% lymphocytes, 3% monocytes, 1% basophils, 5% metamyelocytes, 6% myelocytes and 2% blasts.  A reticulocyte count was 5.4%, sed rate was 93 and LDH was markedly elevated at 1939.  No monoclonal spike was seen.  I reviewed her peripheral blood smear myself and did confirm a rare blast and promyelocyte.  She was understandably anxious since her father had some form of leukemia at age 89 and lived 5 years.  She thinks this may have been chronic myelogenous leukemia.  An aunt also had leukemia.  She does note dyspnea with exertion.  She does see a nephrologist in [facility] with [facility] for chronic kidney disease, Toye Lufadeju, MD.  She also has venous stasis and has wraps on her legs.  She complains of pain of her lower back and legs at a 6/10.  When I saw her in August 2018, her white count was still elevated at 14.5 with 45% neutrophils, 19% bands, 9% lymphocytes, 1% monocytes, 2% eosinophils, 2%  basophils, 8% metamyelocytes, 14% myelocytes and 1 nucleated red blood cell per 100 WBC's.  Her hemoglobin was stable at 11.3 but her platelet count was mildly lower at 74,000 and a smear revealed occasional teardrops.  JAK 2 mutation was negative.  We therefore performed a bone marrow on August 22nd and this was consistent with myelofibrosis.  The cytogenetics revealed normal 75 XX chromosomes and PCR for bcr/ABL was negative.  The sample was very limited as the aspirate was mainly blood.  There was a predominance of granulocytes, red cells and occasional atypical megakaryocytes associated with reticulin fibrosis, for a diagnosis of myelofibrosis.  Since that time she has had a stroke, and she has worsening vascular dementia.    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 This has caused her blood counts to be chronically low, and does not require treatment at this time. I would only transfuse her if her hemoglobin drops to 7.5 or less.   INTERVAL HISTORY:  Erin [name] is here for routine follow up for Myelofibrosis. Patient states that she feels okay and she complains of chronic pain in her knees and back and she gets injections with cortisone for the pain. Her last dose was 3 weeks ago. Her labs from the last time looked better with her hemoglobin up to 10.6 and her platelet count increased from 52,000 to 90,000. Her labs are pending today and I will call her with  the results. I reviewed her examination findings with her and she is doing well and we need to keep monitoring her blood level. She has physical therapy a few days a week to exercise her legs and I advised that she should continue with it. I will see her  back in 6 months with CBC, CMP, iron, TIBC, and ferritin. She  denies signs of infections such as sore throat, sinus drainage, cough or urinary symptoms. She  denies fever or recurrent chills. She  also deny nausea, vomiting, chest pain, or dyspnea. Her  appetite is good.  She was not weighed today.  She is accompanied today by her daughter.    REVIEW OF SYSTEMS:  Review of Systems  Constitutional:  Positive for fatigue. Negative for appetite change, chills, diaphoresis, fever and unexpected weight change.       Hair thinning  HENT:  Negative.  Negative for hearing loss, lump/mass, mouth sores, nosebleeds, sore throat, tinnitus, trouble swallowing and voice change.   Eyes: Negative.  Negative for eye problems and icterus.  Respiratory: Negative.  Negative for chest tightness, cough, hemoptysis, shortness of breath and wheezing.   Cardiovascular:  Positive for leg swelling (bilateral, severe). Negative for chest pain and palpitations.  Gastrointestinal: Negative.  Negative for abdominal distention, abdominal pain, blood in stool, constipation, diarrhea, nausea, rectal pain and vomiting.  Endocrine: Negative.   Genitourinary: Negative.  Negative for bladder incontinence, difficulty urinating, dyspareunia, dysuria, frequency, hematuria, menstrual problem, nocturia, pelvic pain, vaginal bleeding and vaginal discharge.   Musculoskeletal:  Positive for arthralgias, back pain and gait problem (in a wheelchair). Negative for flank pain, myalgias, neck pain and neck stiffness.  Skin: Negative.  Negative for itching, rash and wound.  Neurological:  Positive for gait problem (in a wheelchair). Negative for dizziness, extremity weakness, headaches, light-headedness, numbness, seizures and speech difficulty.  Hematological: Negative.  Negative for adenopathy. Does not bruise/bleed easily.  Psychiatric/Behavioral:  Positive for confusion. Negative for decreased concentration, depression, sleep disturbance and suicidal ideas. The patient is not nervous/anxious.      VITALS:  Blood pressure (!) 169/60, pulse 82, temperature 98 F (36.7 C), temperature source Oral, resp. rate (!) 187, SpO2 96%.  Wt Readings from Last 3 Encounters:  02/12/22 225 lb 1.4 oz (102.1 kg)  10/07/20  220 lb (99.8 kg)  08/26/20 215 lb 3.2 oz (97.6 kg)    There is no height or weight on file to calculate BMI.  Performance status (ECOG): 2 - Symptomatic, <50% confined to bed  PHYSICAL EXAM:  Physical Exam Vitals and nursing note reviewed. Exam conducted with a chaperone present.  Constitutional:      General: She is not in acute distress.    Appearance: Normal appearance. She is normal weight. She is not ill-appearing, toxic-appearing or diaphoretic.  HENT:     Head: Normocephalic and atraumatic.     Comments: Hair thinning    Right Ear: Tympanic membrane, ear canal and external ear normal. There is no impacted cerumen.     Left Ear: Tympanic membrane, ear canal and external ear normal. There is no impacted cerumen.     Nose: Nose normal. No congestion or rhinorrhea.     Mouth/Throat:     Mouth: Mucous membranes are moist.     Pharynx: Oropharynx is clear. No oropharyngeal exudate or posterior oropharyngeal erythema.  Eyes:     General: No scleral icterus.       Right eye: No discharge.        Left  eye: No discharge.     Extraocular Movements: Extraocular movements intact.     Conjunctiva/sclera: Conjunctivae normal.     Pupils: Pupils are equal, round, and reactive to light.  Neck:     Vascular: No carotid bruit.  Cardiovascular:     Rate and Rhythm: Normal rate and regular rhythm.     Pulses: Normal pulses.     Heart sounds: Normal heart sounds. No murmur heard.    No friction rub. No gallop.  Pulmonary:     Effort: Pulmonary effort is normal. No respiratory distress.     Breath sounds: Normal breath sounds. No stridor. No wheezing, rhonchi or rales.  Chest:     Chest wall: No tenderness.  Abdominal:     General: Bowel sounds are normal. There is no distension.     Palpations: Abdomen is soft. There is no hepatomegaly, splenomegaly or mass.     Tenderness: There is no abdominal tenderness. There is no right CVA tenderness, left CVA tenderness, guarding or rebound.      Hernia: No hernia is present.     Comments: The liver edge is below the right coastal margin The spleen is 2-3 breaths below the left coastal margin  Musculoskeletal:        General: Swelling present. No tenderness, deformity or signs of injury. Normal range of motion.     Cervical back: Normal range of motion and neck supple. No rigidity or tenderness.     Right lower leg: Edema (2-3+, with severe venous changes.) present.     Left lower leg: Edema (2-3+, with severe venous changes.) present.     Comments: Severe bilateral of the lower extremitiy edema  2+ to 3+ edema with chronic venous stasis changes  Lymphadenopathy:     Cervical: No cervical adenopathy.  Skin:    General: Skin is warm and dry.     Coloration: Skin is not jaundiced or pale.     Findings: No bruising, erythema, lesion or rash.  Neurological:     General: No focal deficit present.     Mental Status: She is alert and oriented to person, place, and time. Mental status is at baseline.     Cranial Nerves: No cranial nerve deficit.     Sensory: No sensory deficit.     Motor: No weakness.     Coordination: Coordination normal.     Gait: Gait normal.     Deep Tendon Reflexes: Reflexes normal.  Psychiatric:        Mood and Affect: Mood is not anxious or depressed.        Speech: Speech is not rapid and pressured.        Thought Content: Thought content is not paranoid. Thought content does not include suicidal ideation.        Cognition and Memory: Cognition is impaired. She exhibits impaired recent memory.        Judgment: Judgment is not inappropriate.     LABS:      Latest Ref Rng & Units 10/27/2022   10:18 AM 04/28/2022   12:00 AM 02/13/2022    3:51 AM  CBC  WBC 4.0 - 10.5 K/uL 3.8  8.8     4.9   Hemoglobin 12.0 - 15.0 g/dL 8.5  16.1     9.3   Hematocrit 36.0 - 46.0 % 27.4  32     29.5   Platelets 150 - 400 K/uL 114  98     52  This result is from an external source.      Latest Ref Rng & Units  10/27/2022   10:18 AM 04/28/2022   12:00 AM 02/13/2022    3:51 AM  CMP  Glucose 70 - 99 mg/dL 161   99   BUN 8 - 23 mg/dL 40  31     33   Creatinine 0.44 - 1.00 mg/dL 0.96  0.9     0.45   Sodium 135 - 145 mmol/L 137  136     142   Potassium 3.5 - 5.1 mmol/L 4.6  4.3     4.1   Chloride 98 - 111 mmol/L 104  103     108   CO2 22 - 32 mmol/L 22  26     24    Calcium 8.9 - 10.3 mg/dL 8.7  9.1     8.7   Total Protein 6.5 - 8.1 g/dL 6.3     Total Bilirubin 0.3 - 1.2 mg/dL 0.6     Alkaline Phos 38 - 126 U/L 108  115       AST 15 - 41 U/L 23  38       ALT 0 - 44 U/L 19  44          This result is from an external source.     No results found for: "TOTALPROTELP", "ALBUMINELP", "A1GS", "A2GS", "BETS", "BETA2SER", "GAMS", "MSPIKE", "SPEI" Lab Results  Component Value Date   TIBC 236 (L) 12/24/2021   TIBC 299 04/27/2021   TIBC 290 04/27/2021   FERRITIN 263 12/24/2021   FERRITIN 108 04/27/2021   FERRITIN 98 04/27/2021   IRONPCTSAT 29 12/24/2021   IRONPCTSAT 20 04/27/2021   IRONPCTSAT 19 04/27/2021   Lab Results  Component Value Date   LDH 1,016 (A) 04/03/2020     STUDIES:  Exam: 03/19/2022 XA DG INJECT/THERA INC NEEDLE/CATH/PLC EPI/LUMB/SAC W/IMG  IMPRESSION: Technically successful interlaminar epidural injection on the right at L5-S1.    EXAM:11/19/2021 [place] LIMITED JOINT SPACE STRUCTURES LOW RIGHT IMPRESSION: Limited muscular skeletal ultrasound was performed and interpreted by Antoine Primas, M  Limited ultrasound shows that there may be a very small hypoechoic mass noted in the popliteal area but appears to be potentially ruptured with some drainage into the medial gastrocnemius area.  Mild pain with compression. Impression: Likely ruptured Baker's cyst   HISTORY:   Allergies:  Allergies  Allergen Reactions   Bee Venom     Unknown    Current Medications: Current Outpatient Medications  Medication Sig Dispense Refill   allopurinol (ZYLOPRIM) 100 MG tablet Take 150  mg by mouth daily. For gout     ALPRAZolam (XANAX) 0.25 MG tablet Take 0.25 mg by mouth at bedtime.     atorvastatin (LIPITOR) 40 MG tablet TAKE 1 TABLET (40 MG TOTAL) BY MOUTH DAILY. PT NEEDS OV FOR FURTHER REFILLS (Patient taking differently: Take 1 tablet by mouth every evening.) 90 tablet 1   busPIRone (BUSPAR) 15 MG tablet Take 1 tablet (15 mg total) by mouth 3 (three) times daily. Pt needs OV for further refills 270 tablet 0   Cholecalciferol 25 MCG (1000 UT) tablet Take 1 tablet by mouth daily.     diclofenac Sodium (VOLTAREN) 1 % GEL Apply 4 g topically 4 (four) times daily as needed (knee pain).     docusate sodium (COLACE) 100 MG capsule Take 100 mg by mouth 2 (two) times daily.     HYDROmorphone (DILAUDID) 4 MG tablet Take 4  mg by mouth every 8 (eight) hours as needed for severe pain.     ipratropium (ATROVENT) 0.06 % nasal spray Place 2 sprays into both nostrils in the morning and at bedtime.     LORazepam (ATIVAN) 0.5 MG tablet Take 0.5 mg by mouth every 6 (six) hours as needed for anxiety.     metoCLOPramide (REGLAN) 5 MG tablet Take 5 mg by mouth daily.     Multiple Vitamin (MULTI-VITAMIN) tablet Take 1 tablet by mouth daily.     pantoprazole (PROTONIX) 40 MG tablet TAKE 1 TABLET BY MOUTH EVERY DAY (Patient taking differently: Take 40 mg by mouth daily.) 90 tablet 1   potassium chloride (KLOR-CON M) 10 MEQ tablet Take 1 tablet (10 mEq total) by mouth daily.     Sennosides-Docusate Sodium 8.6-50 MG CAPS Take 1 tablet by mouth daily.     sertraline (ZOLOFT) 50 MG tablet TAKE 1 AND 1/2 TABLETS DAILY BY MOUTH (Patient taking differently: Take 50 mg by mouth 2 (two) times daily. TAKE 1 AND 1/2 TABLETS DAILY BY MOUTH) 135 tablet 1   torsemide (DEMADEX) 20 MG tablet Take 1 tablet (20 mg total) by mouth daily.     traZODone (DESYREL) 50 MG tablet Take 50 mg by mouth at bedtime.     albuterol (VENTOLIN HFA) 108 (90 Base) MCG/ACT inhaler Inhale into the lungs.     gabapentin (NEURONTIN) 100  MG capsule Take by mouth.     No current facility-administered medications for this visit.     ASSESSMENT & PLAN:   Assessment:   1.  Myelofibrosis, diagnosed in August 2018. This likely will not threaten her life, and so only routine surveillance is recommended.  She has pancytopenia, but her counts will chronically be low and there is no specific therapy for her. Her blood counts were better than usual last time.  2.  Severe lower extremity edema.   3.  Vascular dementia. She seems to be doing fairly well today with her mental status and speech.  4.  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    Plan: Her labs from the last time looked better with her hemoglobin up to 10.6 and her platelet count increased from 52,000 to 90,000. Her labs are pending today and I will call her with the results. I reviewed her examination findings with her and she is doing well and we need to keep monitoring her blood. She has physical therapy a few days a week to exercise her legs and I advised that she should continue with it. I will see her  back in 6 months with CBC, CMP, iron, TIBC, and ferritin.  She and her daughter understand and agree with this plan of care.  I provided 20 minutes of face-to-face time during this this encounter and > 50% was spent counseling as documented under my assessment and plan.    Dellia Beckwith, MD [facility] [address] [place] [address] Dept: [phone] Dept Fax: [phone]     I,Oluwatobi Asade,acting as a scribe for Dellia Beckwith, MD.,have documented all relevant documentation on the behalf of Dellia Beckwith, MD,as directed by  Dellia Beckwith, MD while in the presence of Dellia Beckwith, MD.

## 2022-10-27 ENCOUNTER — Other Ambulatory Visit: Payer: Self-pay | Admitting: Oncology

## 2022-10-27 ENCOUNTER — Inpatient Hospital Stay: Payer: Medicare Other | Attending: Oncology | Admitting: Oncology

## 2022-10-27 ENCOUNTER — Encounter: Payer: Self-pay | Admitting: Oncology

## 2022-10-27 ENCOUNTER — Inpatient Hospital Stay: Payer: No Typology Code available for payment source

## 2022-10-27 VITALS — BP 169/60 | HR 82 | Temp 98.0°F | Resp 187

## 2022-10-27 DIAGNOSIS — G8929 Other chronic pain: Secondary | ICD-10-CM | POA: Insufficient documentation

## 2022-10-27 DIAGNOSIS — R7402 Elevation of levels of lactic acid dehydrogenase (LDH): Secondary | ICD-10-CM | POA: Insufficient documentation

## 2022-10-27 DIAGNOSIS — R410 Disorientation, unspecified: Secondary | ICD-10-CM | POA: Diagnosis not present

## 2022-10-27 DIAGNOSIS — M25562 Pain in left knee: Secondary | ICD-10-CM | POA: Insufficient documentation

## 2022-10-27 DIAGNOSIS — D473 Essential (hemorrhagic) thrombocythemia: Secondary | ICD-10-CM | POA: Diagnosis not present

## 2022-10-27 DIAGNOSIS — D7581 Myelofibrosis: Secondary | ICD-10-CM | POA: Insufficient documentation

## 2022-10-27 DIAGNOSIS — R5383 Other fatigue: Secondary | ICD-10-CM | POA: Diagnosis not present

## 2022-10-27 DIAGNOSIS — D649 Anemia, unspecified: Secondary | ICD-10-CM | POA: Diagnosis not present

## 2022-10-27 DIAGNOSIS — Z91030 Bee allergy status: Secondary | ICD-10-CM | POA: Diagnosis not present

## 2022-10-27 DIAGNOSIS — D6959 Other secondary thrombocytopenia: Secondary | ICD-10-CM | POA: Diagnosis not present

## 2022-10-27 DIAGNOSIS — D75839 Thrombocytosis, unspecified: Secondary | ICD-10-CM | POA: Insufficient documentation

## 2022-10-27 DIAGNOSIS — M7989 Other specified soft tissue disorders: Secondary | ICD-10-CM | POA: Insufficient documentation

## 2022-10-27 DIAGNOSIS — D474 Osteomyelofibrosis: Secondary | ICD-10-CM

## 2022-10-27 DIAGNOSIS — Z79899 Other long term (current) drug therapy: Secondary | ICD-10-CM | POA: Insufficient documentation

## 2022-10-27 DIAGNOSIS — M2550 Pain in unspecified joint: Secondary | ICD-10-CM | POA: Diagnosis not present

## 2022-10-27 DIAGNOSIS — D696 Thrombocytopenia, unspecified: Secondary | ICD-10-CM

## 2022-10-27 DIAGNOSIS — D509 Iron deficiency anemia, unspecified: Secondary | ICD-10-CM

## 2022-10-27 DIAGNOSIS — R600 Localized edema: Secondary | ICD-10-CM | POA: Diagnosis not present

## 2022-10-27 DIAGNOSIS — M25561 Pain in right knee: Secondary | ICD-10-CM | POA: Insufficient documentation

## 2022-10-27 DIAGNOSIS — M549 Dorsalgia, unspecified: Secondary | ICD-10-CM | POA: Diagnosis not present

## 2022-10-27 DIAGNOSIS — D61818 Other pancytopenia: Secondary | ICD-10-CM | POA: Insufficient documentation

## 2022-10-27 DIAGNOSIS — R0609 Other forms of dyspnea: Secondary | ICD-10-CM | POA: Insufficient documentation

## 2022-10-27 DIAGNOSIS — Z8673 Personal history of transient ischemic attack (TIA), and cerebral infarction without residual deficits: Secondary | ICD-10-CM | POA: Insufficient documentation

## 2022-10-27 DIAGNOSIS — F0150 Vascular dementia without behavioral disturbance: Secondary | ICD-10-CM | POA: Diagnosis not present

## 2022-10-27 DIAGNOSIS — N189 Chronic kidney disease, unspecified: Secondary | ICD-10-CM | POA: Insufficient documentation

## 2022-10-27 LAB — CBC WITH DIFFERENTIAL (CANCER CENTER ONLY)
Abs Immature Granulocytes: 0.11 10*3/uL — ABNORMAL HIGH (ref 0.00–0.07)
Basophils Absolute: 0 10*3/uL (ref 0.0–0.1)
Basophils Relative: 1 %
Eosinophils Absolute: 0 10*3/uL (ref 0.0–0.5)
Eosinophils Relative: 1 %
HCT: 27.4 % — ABNORMAL LOW (ref 36.0–46.0)
Hemoglobin: 8.5 g/dL — ABNORMAL LOW (ref 12.0–15.0)
Immature Granulocytes: 3 %
Lymphocytes Relative: 13 %
Lymphs Abs: 0.5 10*3/uL — ABNORMAL LOW (ref 0.7–4.0)
MCH: 29.3 pg (ref 26.0–34.0)
MCHC: 31 g/dL (ref 30.0–36.0)
MCV: 94.5 fL (ref 80.0–100.0)
Monocytes Absolute: 0.3 10*3/uL (ref 0.1–1.0)
Monocytes Relative: 7 %
Neutro Abs: 2.9 10*3/uL (ref 1.7–7.7)
Neutrophils Relative %: 75 %
Platelet Count: 114 10*3/uL — ABNORMAL LOW (ref 150–400)
RBC: 2.9 MIL/uL — ABNORMAL LOW (ref 3.87–5.11)
RDW: 18.3 % — ABNORMAL HIGH (ref 11.5–15.5)
WBC Count: 3.8 10*3/uL — ABNORMAL LOW (ref 4.0–10.5)
nRBC: 1.1 % — ABNORMAL HIGH (ref 0.0–0.2)

## 2022-10-27 LAB — CMP (CANCER CENTER ONLY)
ALT: 19 U/L (ref 0–44)
AST: 23 U/L (ref 15–41)
Albumin: 3.4 g/dL — ABNORMAL LOW (ref 3.5–5.0)
Alkaline Phosphatase: 108 U/L (ref 38–126)
Anion gap: 11 (ref 5–15)
BUN: 40 mg/dL — ABNORMAL HIGH (ref 8–23)
CO2: 22 mmol/L (ref 22–32)
Calcium: 8.7 mg/dL — ABNORMAL LOW (ref 8.9–10.3)
Chloride: 104 mmol/L (ref 98–111)
Creatinine: 1.22 mg/dL — ABNORMAL HIGH (ref 0.44–1.00)
GFR, Estimated: 46 mL/min — ABNORMAL LOW (ref >60)
Glucose, Bld: 145 mg/dL — ABNORMAL HIGH (ref 70–99)
Potassium: 4.6 mmol/L (ref 3.5–5.1)
Sodium: 137 mmol/L (ref 135–145)
Total Bilirubin: 0.6 mg/dL (ref 0.3–1.2)
Total Protein: 6.3 g/dL — ABNORMAL LOW (ref 6.5–8.1)

## 2022-11-09 ENCOUNTER — Other Ambulatory Visit: Payer: Self-pay

## 2022-11-09 ENCOUNTER — Telehealth: Payer: Self-pay | Admitting: Family Medicine

## 2022-11-09 DIAGNOSIS — M5416 Radiculopathy, lumbar region: Secondary | ICD-10-CM

## 2022-11-09 NOTE — Telephone Encounter (Signed)
Patient's daughter called asking if another epidural could be ordered for Erin [name]? She said that she is having reoccurring pain, but when she had the last one at [facility], they had trouble getting her on the table and recommended having the next one at the hospital?  Please advise.

## 2022-11-09 NOTE — Telephone Encounter (Signed)
Spoke with daughter. Epidural ordered.

## 2022-11-22 ENCOUNTER — Telehealth: Payer: Self-pay

## 2022-11-22 NOTE — Telephone Encounter (Signed)
-----   Message from Dellia Beckwith sent at 11/10/2022  7:01 AM EDT ----- Regarding: call sister Tell her family that [facility] and RBC's lower than last time but platelets are better.  I don't have explanation for this, it is common to see ups and downs with myelofibrosis. She is a little dehydrated with kidney function mildly abnormal.  I will check iron levels next time.  Send copy of labs to facility

## 2022-11-22 NOTE — Telephone Encounter (Signed)
Attempted to contact daughter Marylu Lund. Unable to reach and no VM.

## 2022-12-07 NOTE — Progress Notes (Signed)
[name] [address] [place] [address] Phone: [phone] Subjective:    I'm seeing this patient by the request  of:  Garlan Fillers, MD  CC: Bilateral knee pain  [id number]  10/05/2022 Chronic, with exacerbation.  Worsening symptoms.  Patient is almost nonambulatory but does need her knees to allow her to continue to help her with transitions and transferring at her nursing facility.  Still seems to help for some time now.  Due to patient's comorbidities is not a surgical candidate.  Follow-up with me again 10 weeks   Updated 12/14/2022 [name] is a 77 y.o. female coming in with complaint of knee pain.  Patient is accompanied with family.  Patient has had significant difficulty with her memory as well is on high doses causing her to be more somnolent.       Past Medical History:  Diagnosis Date   Asthma    CHF (congestive heart failure) (HCC)    hospital 11/2013-- [place]   Chronic kidney disease    Depression    Diabetes mellitus (HCC)    GERD (gastroesophageal reflux disease)    Hyperlipidemia    Hypertension    Stroke [facility])    Past Surgical History:  Procedure Laterality Date   CHOLECYSTECTOMY  2002   LOOP RECORDER INSERTION N/A 08/01/2017   Procedure: LOOP RECORDER INSERTION;  Surgeon: Regan Lemming, MD;  Location: [facility];  Service: Cardiovascular;  Laterality: N/A;   SPINE SURGERY  aug 2015   [facility]   TEE WITHOUT CARDIOVERSION N/A 08/01/2017   Procedure: TRANSESOPHAGEAL ECHOCARDIOGRAM (TEE);  Surgeon: Thurmon Fair, MD;  Location: [facility] ENDOSCOPY;  Service: Cardiovascular;  Laterality: N/A;   TONSILLECTOMY     TUBAL LIGATION     Social History   Socioeconomic History   Marital status: Widowed    Spouse name: Not on file   Number of children: 4   Years of education: 62   Highest education level: 12th grade  Occupational History   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, materials: [employer]  Tobacco Use   Smoking status: Former    Current packs/day: 0.00    Average packs/day: 1.5 packs/day for 20.0 years (30.0 ttl pk-yrs)    Types: Cigarettes    Start date: 08/02/1964    Quit date: 08/02/1984    Years since quitting: 38.3   Smokeless tobacco: Never  Vaping Use   Vaping status: Never Used  Substance and Sexual Activity   Alcohol use: No   Drug use: No   Sexual activity: Not Currently    Partners: Male  Other Topics Concern   Not on file  Social History Narrative   NO REG EXERCISE   Caffeine use: daily   Social Determinants of Health   Financial Resource Strain: Low Risk  (09/05/2020)   Overall Financial Resource Strain (CARDIA)    Difficulty of Paying Living Expenses: Not hard at all  Food Insecurity: No Food Insecurity (09/05/2020)   Hunger Vital Sign    Worried About Running Out of Food in the Last Year: Never true    Ran Out of Food in the Last Year: Never true  Transportation Needs: No Transportation Needs (09/05/2020)   PRAPARE - Administrator, Civil Service (Medical): No    Lack of Transportation (Non-Medical): No  Physical Activity: Inactive (09/05/2020)   Exercise Vital Sign    Days of Exercise per Week: 0 days  Minutes of Exercise per Session: 0 min  Stress: No Stress Concern Present (09/05/2020)   [employer] of Occupational Health - Occupational Stress Questionnaire    Feeling of Stress : Only a little  Social Connections: Socially Isolated (09/05/2020)   Social Connection and Isolation Panel [NHANES]    Frequency of Communication with Friends and Family: More than three times a week    Frequency of Social Gatherings with Friends and Family: More than three times a week    Attends Religious Services: Never    Database administrator or Organizations: No    Attends Banker Meetings: Never    Marital Status: Widowed   Allergies  Allergen Reactions   Bee Venom     Unknown   Family History  Problem  Relation Age of Onset   Hypertension Mother    Alzheimer's disease Mother    Hyperlipidemia Mother    Heart disease Father        cad   Asthma Father    Cancer Father 58       leukemia   Stroke Father    Hypertension Father    Heart disease Sister 47       MI   Pulmonary fibrosis Sister    Arthritis Brother    Heart disease Maternal Uncle    Stroke Maternal Uncle    Uterine cancer Paternal Grandmother    Stomach cancer Paternal Grandfather    Heart disease Maternal Uncle    Heart disease Maternal Uncle    Cancer Maternal Aunt        leukemia     Current Outpatient Medications (Cardiovascular):    atorvastatin (LIPITOR) 40 MG tablet, TAKE 1 TABLET (40 MG TOTAL) BY MOUTH DAILY. PT NEEDS OV FOR FURTHER REFILLS (Patient taking differently: Take 1 tablet by mouth every evening.)   torsemide (DEMADEX) 20 MG tablet, Take 1 tablet (20 mg total) by mouth daily.  Current Outpatient Medications (Respiratory):    albuterol (VENTOLIN HFA) 108 (90 Base) MCG/ACT inhaler, Inhale into the lungs.   ipratropium (ATROVENT) 0.06 % nasal spray, Place 2 sprays into both nostrils in the morning and at bedtime.  Current Outpatient Medications (Analgesics):    allopurinol (ZYLOPRIM) 100 MG tablet, Take 150 mg by mouth daily. For gout   HYDROmorphone (DILAUDID) 4 MG tablet, Take 4 mg by mouth every 8 (eight) hours as needed for severe pain.   Current Outpatient Medications (Other):    ALPRAZolam (XANAX) 0.25 MG tablet, Take 0.25 mg by mouth at bedtime.   busPIRone (BUSPAR) 15 MG tablet, Take 1 tablet (15 mg total) by mouth 3 (three) times daily. Pt needs OV for further refills   Cholecalciferol 25 MCG (1000 UT) tablet, Take 1 tablet by mouth daily.   diclofenac Sodium (VOLTAREN) 1 % GEL, Apply 4 g topically 4 (four) times daily as needed (knee pain).   docusate sodium (COLACE) 100 MG capsule, Take 100 mg by mouth 2 (two) times daily.   gabapentin (NEURONTIN) 100 MG capsule, Take by mouth.    LORazepam (ATIVAN) 0.5 MG tablet, Take 0.5 mg by mouth every 6 (six) hours as needed for anxiety.   metoCLOPramide (REGLAN) 5 MG tablet, Take 5 mg by mouth daily.   Multiple Vitamin (MULTI-VITAMIN) tablet, Take 1 tablet by mouth daily.   pantoprazole (PROTONIX) 40 MG tablet, TAKE 1 TABLET BY MOUTH EVERY DAY (Patient taking differently: Take 40 mg by mouth daily.)   potassium chloride (KLOR-CON M) 10 MEQ tablet, Take 1  tablet (10 mEq total) by mouth daily.   Sennosides-Docusate Sodium 8.6-50 MG CAPS, Take 1 tablet by mouth daily.   sertraline (ZOLOFT) 50 MG tablet, TAKE 1 AND 1/2 TABLETS DAILY BY MOUTH (Patient taking differently: Take 50 mg by mouth 2 (two) times daily. TAKE 1 AND 1/2 TABLETS DAILY BY MOUTH)   traZODone (DESYREL) 50 MG tablet, Take 50 mg by mouth at bedtime.   Reviewed prior external information including notes and imaging from  primary care provider As well as notes that were available from care everywhere and other healthcare systems.  Past medical history, social, surgical and family history all reviewed in electronic medical record.  No pertanent information unless stated regarding to the chief complaint.   Review of Systems:  No headache, visual changes, nausea, vomiting, diarrhea, constipation, dizziness, abdominal pain, skin rash, fevers, chills, night sweats, weight loss, swollen lymph nodes, body aches, joint swelling, chest pain, shortness of breath, mood changes. POSITIVE muscle aches  Objective  Height 5\' 2"  (1.575 m).   General: No apparent distress alert and oriented x3 mood and affect normal, dressed appropriately.  HEENT: Pupils equal, extraocular movements intact  Pitting edema in the lower extremities bilaterally.  No sign of any type of infectious etiology. Of the knees with patient sitting in a wheelchair.  Tender to palpation to even light sensation.  After informed written and verbal consent, patient was seated on exam table. Right knee was prepped  with alcohol swab and utilizing anterolateral approach, patient's right knee space was injected with 4:1  marcaine 0.5%: Kenalog 40mg /dL. Patient tolerated the procedure well without immediate complications.  After informed written and verbal consent, patient was seated on exam table. Left knee was prepped with alcohol swab and utilizing anterolateral approach, patient's left knee space was injected with 4:1  marcaine 0.5%: Kenalog 40mg /dL. Patient tolerated the procedure well without immediate complications.   Impression and Recommendations:     The above documentation has been reviewed and is accurate and complete Judi Saa, DO

## 2022-12-10 ENCOUNTER — Encounter: Payer: Self-pay | Admitting: Family Medicine

## 2022-12-14 ENCOUNTER — Encounter: Payer: Self-pay | Admitting: Family Medicine

## 2022-12-14 ENCOUNTER — Ambulatory Visit (INDEPENDENT_AMBULATORY_CARE_PROVIDER_SITE_OTHER): Payer: Medicare Other | Admitting: Family Medicine

## 2022-12-14 VITALS — Ht 62.0 in

## 2022-12-14 DIAGNOSIS — M170 Bilateral primary osteoarthritis of knee: Secondary | ICD-10-CM | POA: Diagnosis not present

## 2022-12-14 NOTE — Patient Instructions (Signed)
You know the drill  See me again in 10 weeks

## 2022-12-14 NOTE — Assessment & Plan Note (Signed)
Degenerative arthritic changes noted.  Discussed instability noted.  Discussed which activities to do and which ones to avoid.  Patient is also a level 5 caveat now with her dementia.  Patient is also on high doses of pain medication that is causing her to be somnolent.  These are some social determinants of health as well.

## 2023-02-21 NOTE — Progress Notes (Deleted)
[name] [address] [place] [address] Phone: [phone] Subjective:    I'm seeing this patient by the request  of:  Garlan Fillers, MD  CC:   [id number]  12/14/2022 Degenerative arthritic changes noted.  Discussed instability noted.  Discussed which activities to do and which ones to avoid.  Patient is also a level 5 caveat now with her dementia.  Patient is also on high doses of pain medication that is causing her to be somnolent.  These are some social determinants of health as well.   Updated 01/22/2023 [name] is a 77 y.o. female coming in with complaint of B knee pain.       Past Medical History:  Diagnosis Date   Asthma    CHF (congestive heart failure) (HCC)    hospital 11/2013-- [place]   Chronic kidney disease    Depression    Diabetes mellitus (HCC)    GERD (gastroesophageal reflux disease)    Hyperlipidemia    Hypertension    Stroke [name])    Past Surgical History:  Procedure Laterality Date   CHOLECYSTECTOMY  2002   LOOP RECORDER INSERTION N/A 08/01/2017   Procedure: LOOP RECORDER INSERTION;  Surgeon: Regan Lemming, MD;  Location: [facility];  Service: Cardiovascular;  Laterality: N/A;   SPINE SURGERY  aug 2015   [facility]   TEE WITHOUT CARDIOVERSION N/A 08/01/2017   Procedure: TRANSESOPHAGEAL ECHOCARDIOGRAM (TEE);  Surgeon: Thurmon Fair, MD;  Location: [facility] ENDOSCOPY;  Service: Cardiovascular;  Laterality: N/A;   TONSILLECTOMY     TUBAL LIGATION     Social History   Socioeconomic History   Marital status: Widowed    Spouse name: Not on file   Number of children: 4   Years of education: 64   Highest education level: 12th grade  Occupational History   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, materials: [employer]  Tobacco Use   Smoking status: Former    Current packs/day: 0.00    Average packs/day: 1.5 packs/day for 20.0 years (30.0 ttl pk-yrs)    Types: Cigarettes     Start date: 08/02/1964    Quit date: 08/02/1984    Years since quitting: 38.5   Smokeless tobacco: Never  Vaping Use   Vaping status: Never Used  Substance and Sexual Activity   Alcohol use: No   Drug use: No   Sexual activity: Not Currently    Partners: Male  Other Topics Concern   Not on file  Social History Narrative   NO REG EXERCISE   Caffeine use: daily   Social Determinants of Health   Financial Resource Strain: Low Risk  (09/05/2020)   Overall Financial Resource Strain (CARDIA)    Difficulty of Paying Living Expenses: Not hard at all  Food Insecurity: No Food Insecurity (09/05/2020)   Hunger Vital Sign    Worried About Running Out of Food in the Last Year: Never true    Ran Out of Food in the Last Year: Never true  Transportation Needs: No Transportation Needs (09/05/2020)   PRAPARE - Administrator, Civil Service (Medical): No    Lack of Transportation (Non-Medical): No  Physical Activity: Inactive (09/05/2020)   Exercise Vital Sign    Days of Exercise per Week: 0 days    Minutes of Exercise per Session: 0 min  Stress: No Stress Concern Present (09/05/2020)   [employer] of Occupational Health - Occupational Stress Questionnaire  Feeling of Stress : Only a little  Social Connections: Socially Isolated (09/05/2020)   Social Connection and Isolation Panel [NHANES]    Frequency of Communication with Friends and Family: More than three times a week    Frequency of Social Gatherings with Friends and Family: More than three times a week    Attends Religious Services: Never    Database administrator or Organizations: No    Attends Banker Meetings: Never    Marital Status: Widowed   Allergies  Allergen Reactions   Bee Venom     Unknown   Family History  Problem Relation Age of Onset   Hypertension Mother    Alzheimer's disease Mother    Hyperlipidemia Mother    Heart disease Father        cad   Asthma Father    Cancer Father 32        leukemia   Stroke Father    Hypertension Father    Heart disease Sister 38       MI   Pulmonary fibrosis Sister    Arthritis Brother    Heart disease Maternal Uncle    Stroke Maternal Uncle    Uterine cancer Paternal Grandmother    Stomach cancer Paternal Grandfather    Heart disease Maternal Uncle    Heart disease Maternal Uncle    Cancer Maternal Aunt        leukemia     Current Outpatient Medications (Cardiovascular):    atorvastatin (LIPITOR) 40 MG tablet, TAKE 1 TABLET (40 MG TOTAL) BY MOUTH DAILY. PT NEEDS OV FOR FURTHER REFILLS (Patient taking differently: Take 1 tablet by mouth every evening.)   torsemide (DEMADEX) 20 MG tablet, Take 1 tablet (20 mg total) by mouth daily.  Current Outpatient Medications (Respiratory):    albuterol (VENTOLIN HFA) 108 (90 Base) MCG/ACT inhaler, Inhale into the lungs.   ipratropium (ATROVENT) 0.06 % nasal spray, Place 2 sprays into both nostrils in the morning and at bedtime.  Current Outpatient Medications (Analgesics):    allopurinol (ZYLOPRIM) 100 MG tablet, Take 150 mg by mouth daily. For gout   HYDROmorphone (DILAUDID) 4 MG tablet, Take 4 mg by mouth every 8 (eight) hours as needed for severe pain.   Current Outpatient Medications (Other):    ALPRAZolam (XANAX) 0.25 MG tablet, Take 0.25 mg by mouth at bedtime.   busPIRone (BUSPAR) 15 MG tablet, Take 1 tablet (15 mg total) by mouth 3 (three) times daily. Pt needs OV for further refills   Cholecalciferol 25 MCG (1000 UT) tablet, Take 1 tablet by mouth daily.   diclofenac Sodium (VOLTAREN) 1 % GEL, Apply 4 g topically 4 (four) times daily as needed (knee pain).   docusate sodium (COLACE) 100 MG capsule, Take 100 mg by mouth 2 (two) times daily.   gabapentin (NEURONTIN) 100 MG capsule, Take by mouth.   LORazepam (ATIVAN) 0.5 MG tablet, Take 0.5 mg by mouth every 6 (six) hours as needed for anxiety.   metoCLOPramide (REGLAN) 5 MG tablet, Take 5 mg by mouth daily.   Multiple Vitamin  (MULTI-VITAMIN) tablet, Take 1 tablet by mouth daily.   pantoprazole (PROTONIX) 40 MG tablet, TAKE 1 TABLET BY MOUTH EVERY DAY (Patient taking differently: Take 40 mg by mouth daily.)   potassium chloride (KLOR-CON M) 10 MEQ tablet, Take 1 tablet (10 mEq total) by mouth daily.   Sennosides-Docusate Sodium 8.6-50 MG CAPS, Take 1 tablet by mouth daily.   sertraline (ZOLOFT) 50 MG tablet, TAKE  1 AND 1/2 TABLETS DAILY BY MOUTH (Patient taking differently: Take 50 mg by mouth 2 (two) times daily. TAKE 1 AND 1/2 TABLETS DAILY BY MOUTH)   traZODone (DESYREL) 50 MG tablet, Take 50 mg by mouth at bedtime.   Reviewed prior external information including notes and imaging from  primary care provider As well as notes that were available from care everywhere and other healthcare systems.  Past medical history, social, surgical and family history all reviewed in electronic medical record.  No pertanent information unless stated regarding to the chief complaint.   Review of Systems:  No headache, visual changes, nausea, vomiting, diarrhea, constipation, dizziness, abdominal pain, skin rash, fevers, chills, night sweats, weight loss, swollen lymph nodes, body aches, joint swelling, chest pain, shortness of breath, mood changes. POSITIVE muscle aches  Objective  There were no vitals taken for this visit.   General: No apparent distress alert and oriented x3 mood and affect normal, dressed appropriately.  HEENT: Pupils equal, extraocular movements intact  Respiratory: Patient's speak in full sentences and does not appear short of breath  Cardiovascular: No lower extremity edema, non tender, no erythema      Impression and Recommendations:

## 2023-02-22 ENCOUNTER — Ambulatory Visit: Payer: Medicare Other | Admitting: Family Medicine

## 2023-03-16 NOTE — Progress Notes (Unsigned)
[name] [address] [place] [address] Phone: [phone] Subjective:   Bruce Donath, am serving as a scribe for Dr. Antoine Primas.  I'm seeing this patient by the request  of:  Garlan Fillers, MD  CC: Bilateral knee pain  IEP:[id number]  12/14/2022 Degenerative arthritic changes noted.  Discussed instability noted.  Discussed which activities to do and which ones to avoid.  Patient is also a level 5 caveat now with her dementia.  Patient is also on high doses of pain medication that is causing her to be somnolent.  These are some social determinants of health as well.      Update 03/17/2023 Erin [name] is a 77 y.o. female coming in with complaint of B knee pain. Patient states that she continues to have pain in both knees and upper legs.  Patient does have significant dementia.      Past Medical History:  Diagnosis Date   Asthma    CHF (congestive heart failure) (HCC)    hospital 11/2013-- [place]   Chronic kidney disease    Depression    Diabetes mellitus (HCC)    GERD (gastroesophageal reflux disease)    Hyperlipidemia    Hypertension    Stroke [facility])    Past Surgical History:  Procedure Laterality Date   CHOLECYSTECTOMY  2002   LOOP RECORDER INSERTION N/A 08/01/2017   Procedure: LOOP RECORDER INSERTION;  Surgeon: Regan Lemming, MD;  Location: [facility] INVASIVE CV LAB;  Service: Cardiovascular;  Laterality: N/A;   SPINE SURGERY  aug 2015   [facility]   TEE WITHOUT CARDIOVERSION N/A 08/01/2017   Procedure: TRANSESOPHAGEAL ECHOCARDIOGRAM (TEE);  Surgeon: Thurmon Fair, MD;  Location: [facility] ENDOSCOPY;  Service: Cardiovascular;  Laterality: N/A;   TONSILLECTOMY     TUBAL LIGATION     Social History   Socioeconomic History   Marital status: Widowed    Spouse name: Not on file   Number of children: 4   Years of education: 16   Highest education level: 12th grade  Occupational History   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, materials: [employer]  Tobacco Use   Smoking status: Former    Current packs/day: 0.00    Average packs/day: 1.5 packs/day for 20.0 years (30.0 ttl pk-yrs)    Types: Cigarettes    Start date: 08/02/1964    Quit date: 08/02/1984    Years since quitting: 38.6   Smokeless tobacco: Never  Vaping Use   Vaping status: Never Used  Substance and Sexual Activity   Alcohol use: No   Drug use: No   Sexual activity: Not Currently    Partners: Male  Other Topics Concern   Not on file  Social History Narrative   NO REG EXERCISE   Caffeine use: daily   Social Drivers of Health   Financial Resource Strain: Low Risk  (09/05/2020)   Overall Financial Resource Strain (CARDIA)    Difficulty of Paying Living Expenses: Not hard at all  Food Insecurity: No Food Insecurity (09/05/2020)   Hunger Vital Sign    Worried About Running Out of Food in the Last Year: Never true    Ran Out of Food in the Last Year: Never true  Transportation Needs: No Transportation Needs (09/05/2020)   PRAPARE - Administrator, Civil Service (Medical): No    Lack of Transportation (Non-Medical): No  Physical Activity: Inactive (09/05/2020)   Exercise Vital Sign  Days of Exercise per Week: 0 days    Minutes of Exercise per Session: 0 min  Stress: No Stress Concern Present (09/05/2020)   [employer] of Occupational Health - Occupational Stress Questionnaire    Feeling of Stress : Only a little  Social Connections: Socially Isolated (09/05/2020)   Social Connection and Isolation Panel [NHANES]    Frequency of Communication with Friends and Family: More than three times a week    Frequency of Social Gatherings with Friends and Family: More than three times a week    Attends Religious Services: Never    Database administrator or Organizations: No    Attends Banker Meetings: Never    Marital Status: Widowed   Allergies  Allergen Reactions   Bee Venom     Unknown    Family History  Problem Relation Age of Onset   Hypertension Mother    Alzheimer's disease Mother    Hyperlipidemia Mother    Heart disease Father        cad   Asthma Father    Cancer Father 16       leukemia   Stroke Father    Hypertension Father    Heart disease Sister 64       MI   Pulmonary fibrosis Sister    Arthritis Brother    Heart disease Maternal Uncle    Stroke Maternal Uncle    Uterine cancer Paternal Grandmother    Stomach cancer Paternal Grandfather    Heart disease Maternal Uncle    Heart disease Maternal Uncle    Cancer Maternal Aunt        leukemia     Current Outpatient Medications (Cardiovascular):    atorvastatin (LIPITOR) 40 MG tablet, TAKE 1 TABLET (40 MG TOTAL) BY MOUTH DAILY. PT NEEDS OV FOR FURTHER REFILLS (Patient taking differently: Take 1 tablet by mouth every evening.)   torsemide (DEMADEX) 20 MG tablet, Take 1 tablet (20 mg total) by mouth daily.  Current Outpatient Medications (Respiratory):    albuterol (VENTOLIN HFA) 108 (90 Base) MCG/ACT inhaler, Inhale into the lungs.   ipratropium (ATROVENT) 0.06 % nasal spray, Place 2 sprays into both nostrils in the morning and at bedtime.  Current Outpatient Medications (Analgesics):    allopurinol (ZYLOPRIM) 100 MG tablet, Take 150 mg by mouth daily. For gout   HYDROmorphone (DILAUDID) 4 MG tablet, Take 4 mg by mouth every 8 (eight) hours as needed for severe pain.   Current Outpatient Medications (Other):    ALPRAZolam (XANAX) 0.25 MG tablet, Take 0.25 mg by mouth at bedtime.   busPIRone (BUSPAR) 15 MG tablet, Take 1 tablet (15 mg total) by mouth 3 (three) times daily. Pt needs OV for further refills   Cholecalciferol 25 MCG (1000 UT) tablet, Take 1 tablet by mouth daily.   diclofenac Sodium (VOLTAREN) 1 % GEL, Apply 4 g topically 4 (four) times daily as needed (knee pain).   docusate sodium (COLACE) 100 MG capsule, Take 100 mg by mouth 2 (two) times daily.   gabapentin (NEURONTIN) 100 MG  capsule, Take by mouth.   LORazepam (ATIVAN) 0.5 MG tablet, Take 0.5 mg by mouth every 6 (six) hours as needed for anxiety.   metoCLOPramide (REGLAN) 5 MG tablet, Take 5 mg by mouth daily.   Multiple Vitamin (MULTI-VITAMIN) tablet, Take 1 tablet by mouth daily.   pantoprazole (PROTONIX) 40 MG tablet, TAKE 1 TABLET BY MOUTH EVERY DAY (Patient taking differently: Take 40 mg by mouth daily.)  potassium chloride (KLOR-CON M) 10 MEQ tablet, Take 1 tablet (10 mEq total) by mouth daily.   Sennosides-Docusate Sodium 8.6-50 MG CAPS, Take 1 tablet by mouth daily.   sertraline (ZOLOFT) 50 MG tablet, TAKE 1 AND 1/2 TABLETS DAILY BY MOUTH (Patient taking differently: Take 50 mg by mouth 2 (two) times daily. TAKE 1 AND 1/2 TABLETS DAILY BY MOUTH)   traZODone (DESYREL) 50 MG tablet, Take 50 mg by mouth at bedtime.   Reviewed prior external information including notes and imaging from  primary care provider As well as notes that were available from care everywhere and other healthcare systems.  Past medical history, social, surgical and family history all reviewed in electronic medical record.  No pertanent information unless stated regarding to the chief complaint.   Review of Systems:  No headache, visual changes, nausea, vomiting, diarrhea, constipation, dizziness, abdominal pain, skin rash, fevers, chills, night sweats, weight loss, swollen lymph nodes, body aches, joint swelling, chest pain, shortness of breath, mood changes. POSITIVE muscle aches  Objective  Blood pressure (!) 132/54, pulse 80, height 5\' 2"  (1.575 m), SpO2 93%.   General: No apparent distress friendly  Patient does have swelling.  Is in a wheelchair.  Seems to be more of the lower extremities.  Does have significant arthritic changes of the knees.  After informed written and verbal consent, patient was seated on exam table. Right knee was prepped with alcohol swab and utilizing anterolateral approach, patient's right knee space was  injected with 4:1  marcaine 0.5%: Kenalog 40mg /dL. Patient tolerated the procedure well without immediate complications.  After informed written and verbal consent, patient was seated on exam table. Left knee was prepped with alcohol swab and utilizing anterolateral approach, patient's left knee space was injected with 4:1  marcaine 0.5%: Kenalog 40mg /dL. Patient tolerated the procedure well without immediate complications.   Impression and Recommendations:     The above documentation has been reviewed and is accurate and complete Judi Saa, DO

## 2023-03-17 ENCOUNTER — Ambulatory Visit: Payer: Medicare Other | Admitting: Family Medicine

## 2023-03-17 ENCOUNTER — Encounter: Payer: Self-pay | Admitting: Family Medicine

## 2023-03-17 VITALS — BP 132/54 | HR 80 | Ht 62.0 in

## 2023-03-17 DIAGNOSIS — D474 Osteomyelofibrosis: Secondary | ICD-10-CM | POA: Diagnosis not present

## 2023-03-17 DIAGNOSIS — M170 Bilateral primary osteoarthritis of knee: Secondary | ICD-10-CM | POA: Diagnosis not present

## 2023-03-17 DIAGNOSIS — D473 Essential (hemorrhagic) thrombocythemia: Secondary | ICD-10-CM

## 2023-03-17 NOTE — Patient Instructions (Signed)
See me again in 10 weeks Happy Holidays!

## 2023-03-17 NOTE — Assessment & Plan Note (Signed)
Patient's last blood draw did show the patient actually had what appeared to be pancytopenia.  Encouraged her to have a lab draw and will check with primary care provider.  Discussed with patient's daughter.

## 2023-03-17 NOTE — Assessment & Plan Note (Signed)
Chronic problem.  Discussed exacerbation, we discussed avoiding certain activities.  Discussed which activities to do and which ones to avoid.  Patient is a daughter and we discussed the progression of the severity of this.  Knee injections do help for to complain quite as much do a significant amount of walking.  Can have repeat injections every 10 weeks if necessary.

## 2023-04-27 NOTE — Progress Notes (Incomplete)
[facility]  [address] [place],  [place]  [address] [phone]  Clinic Day: 04/27/23   Referring physician: Garlan Fillers, MD   CHIEF COMPLAINT:  CC:  Myelofibrosis  Current Treatment:  Surveillance  HISTORY OF PRESENT ILLNESS:  Erin [name] is a 78 y.o. female who I saw many years ago for thrombocytosis and she was referred back to me in July of 2018 for thrombocytopenia.  She has also been found to have anemia and both her hemoglobin and platelet count have been decreasing.  Her hemoglobin was down to 9.8 in July 2018 with a platelet count of 74,000. Her white count was elevated at 13,000 with 81% neutrophils, 8% lymphocytes, 9% monocytes, 1% eosinophils, and 1% basophils.  However, when I rechecked these counts on July 26th, I found more of a left shift and her white count was up to 16.6.  Her hemoglobin was a little better at 11.2 with an MCV of 84 and platelet count of 83,000. This time her differential revealed 61% neutrophils, 16% bands, 6% lymphocytes, 3% monocytes, 1% basophils, 5% metamyelocytes, 6% myelocytes and 2% blasts.  A reticulocyte count was 5.4%, sed rate was 93 and LDH was markedly elevated at 1939.  No monoclonal spike was seen.  I reviewed her peripheral blood smear myself and did confirm a rare blast and promyelocyte.  She was understandably anxious since her father had some form of leukemia at age 26 and lived 5 years.  She thinks this may have been chronic myelogenous leukemia.  An aunt also had leukemia.  She does note dyspnea with exertion.  She does see a nephrologist in [facility] with [facility] for chronic kidney disease, Toye Lufadeju, MD.  She also has venous stasis and has wraps on her legs.  She complains of pain of her lower back and legs at a 6/10.  When I saw her in August 2018, her white count was still elevated at 14.5 with 45% neutrophils, 19% bands, 9% lymphocytes, 1% monocytes, 2% eosinophils, 2% basophils,  8% metamyelocytes, 14% myelocytes and 1 nucleated red blood cell per 100 WBC's.  Her hemoglobin was stable at 11.3 but her platelet count was mildly lower at 74,000 and a smear revealed occasional teardrops.  JAK 2 mutation was negative.  We therefore performed a bone marrow on August 22nd and this was consistent with myelofibrosis.  The cytogenetics revealed normal 32 XX chromosomes and PCR for bcr/ABL was negative.  The sample was very limited as the aspirate was mainly blood.  There was a predominance of granulocytes, red cells and occasional atypical megakaryocytes associated with reticulin fibrosis, for a diagnosis of myelofibrosis.  Since that time she has had a stroke, and she has worsening vascular dementia.    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 This has caused her blood counts to be chronically low, and does not require treatment at this time. I would only transfuse her if her hemoglobin drops to 7.5 or less.   INTERVAL HISTORY:  Erin [name] is here for routine follow up for Myelofibrosis. Patient states that she feels *** and ***.      She denies signs of infection such as sore throat, sinus drainage, cough, or urinary symptoms.  She denies fevers or recurrent chills. She denies pain. She denies nausea, vomiting, chest pain, dyspnea or cough. Her appetite is *** and her weight {Weight change:10426}.  Patient states that she feels okay and she complains of chronic pain  in her knees and back and she gets injections with cortisone for the pain. Her last dose was 3 weeks ago. Her labs from the last time looked better with her hemoglobin up to 10.6 and her platelet count increased from 52,000 to 90,000. Her labs are pending today and I will call her with the results. I reviewed her examination findings with her and she is doing well and we need to keep monitoring her blood level. She has physical therapy a few days a week to exercise her legs and I advised that she  should continue with it. I will see her  back in 6 months with CBC, CMP, iron, TIBC, and ferritin. She  denies signs of infections such as sore throat, sinus drainage, cough or urinary symptoms. She  denies fever or recurrent chills. She  also deny nausea, vomiting, chest pain, or dyspnea. Her  appetite is good. She was not weighed today.  She is accompanied today by her daughter.    REVIEW OF SYSTEMS:  Review of Systems  Constitutional:  Positive for fatigue. Negative for appetite change, chills, diaphoresis, fever and unexpected weight change.       Hair thinning  HENT:  Negative.  Negative for hearing loss, lump/mass, mouth sores, nosebleeds, sore throat, tinnitus, trouble swallowing and voice change.   Eyes: Negative.  Negative for eye problems and icterus.  Respiratory: Negative.  Negative for chest tightness, cough, hemoptysis, shortness of breath and wheezing.   Cardiovascular:  Positive for leg swelling (bilateral, severe). Negative for chest pain and palpitations.  Gastrointestinal: Negative.  Negative for abdominal distention, abdominal pain, blood in stool, constipation, diarrhea, nausea, rectal pain and vomiting.  Endocrine: Negative.   Genitourinary: Negative.  Negative for bladder incontinence, difficulty urinating, dyspareunia, dysuria, frequency, hematuria, menstrual problem, nocturia, pelvic pain, vaginal bleeding and vaginal discharge.   Musculoskeletal:  Positive for arthralgias, back pain and gait problem (in a wheelchair). Negative for flank pain, myalgias, neck pain and neck stiffness.  Skin: Negative.  Negative for itching, rash and wound.  Neurological:  Positive for gait problem (in a wheelchair). Negative for dizziness, extremity weakness, headaches, light-headedness, numbness, seizures and speech difficulty.  Hematological: Negative.  Negative for adenopathy. Does not bruise/bleed easily.  Psychiatric/Behavioral:  Positive for confusion. Negative for decreased  concentration, depression, sleep disturbance and suicidal ideas. The patient is not nervous/anxious.      VITALS:  There were no vitals taken for this visit.  Wt Readings from Last 3 Encounters:  02/12/22 225 lb 1.4 oz (102.1 kg)  10/07/20 220 lb (99.8 kg)  08/26/20 215 lb 3.2 oz (97.6 kg)    There is no height or weight on file to calculate BMI.  Performance status (ECOG): 2 - Symptomatic, <50% confined to bed  PHYSICAL EXAM:  Physical Exam Vitals and nursing note reviewed. Exam conducted with a chaperone present.  Constitutional:      General: She is not in acute distress.    Appearance: Normal appearance. She is normal weight. She is not ill-appearing, toxic-appearing or diaphoretic.  HENT:     Head: Normocephalic and atraumatic.     Comments: Hair thinning    Right Ear: Tympanic membrane, ear canal and external ear normal. There is no impacted cerumen.     Left Ear: Tympanic membrane, ear canal and external ear normal. There is no impacted cerumen.     Nose: Nose normal. No congestion or rhinorrhea.     Mouth/Throat:     Mouth: Mucous membranes are moist.  Pharynx: Oropharynx is clear. No oropharyngeal exudate or posterior oropharyngeal erythema.  Eyes:     General: No scleral icterus.       Right eye: No discharge.        Left eye: No discharge.     Extraocular Movements: Extraocular movements intact.     Conjunctiva/sclera: Conjunctivae normal.     Pupils: Pupils are equal, round, and reactive to light.  Neck:     Vascular: No carotid bruit.  Cardiovascular:     Rate and Rhythm: Normal rate and regular rhythm.     Pulses: Normal pulses.     Heart sounds: Normal heart sounds. No murmur heard.    No friction rub. No gallop.  Pulmonary:     Effort: Pulmonary effort is normal. No respiratory distress.     Breath sounds: Normal breath sounds. No stridor. No wheezing, rhonchi or rales.  Chest:     Chest wall: No tenderness.  Abdominal:     General: Bowel sounds are  normal. There is no distension.     Palpations: Abdomen is soft. There is no hepatomegaly, splenomegaly or mass.     Tenderness: There is no abdominal tenderness. There is no right CVA tenderness, left CVA tenderness, guarding or rebound.     Hernia: No hernia is present.     Comments: The liver edge is below the right coastal margin The spleen is 2-3 breaths below the left coastal margin  Musculoskeletal:        General: Swelling present. No tenderness, deformity or signs of injury. Normal range of motion.     Cervical back: Normal range of motion and neck supple. No rigidity or tenderness.     Right lower leg: Edema (2-3+, with severe venous changes.) present.     Left lower leg: Edema (2-3+, with severe venous changes.) present.     Comments: Severe bilateral of the lower extremitiy edema  2+ to 3+ edema with chronic venous stasis changes  Lymphadenopathy:     Cervical: No cervical adenopathy.  Skin:    General: Skin is warm and dry.     Coloration: Skin is not jaundiced or pale.     Findings: No bruising, erythema, lesion or rash.  Neurological:     General: No focal deficit present.     Mental Status: She is alert and oriented to person, place, and time. Mental status is at baseline.     Cranial Nerves: No cranial nerve deficit.     Sensory: No sensory deficit.     Motor: No weakness.     Coordination: Coordination normal.     Gait: Gait normal.     Deep Tendon Reflexes: Reflexes normal.  Psychiatric:        Mood and Affect: Mood is not anxious or depressed.        Speech: Speech is not rapid and pressured.        Thought Content: Thought content is not paranoid. Thought content does not include suicidal ideation.        Cognition and Memory: Cognition is impaired. She exhibits impaired recent memory.        Judgment: Judgment is not inappropriate.    LABS:      Latest Ref Rng & Units 10/27/2022   10:18 AM 04/28/2022   12:00 AM 02/13/2022    3:51 AM  CBC  WBC 4.0 -  10.5 K/uL 3.8  8.8     4.9   Hemoglobin 12.0 - 15.0 g/dL 8.5  10.6     9.3   Hematocrit 36.0 - 46.0 % 27.4  32     29.5   Platelets 150 - 400 K/uL 114  98     52      This result is from an external source.      Latest Ref Rng & Units 10/27/2022   10:18 AM 04/28/2022   12:00 AM 02/13/2022    3:51 AM  CMP  Glucose 70 - 99 mg/dL 272   99   BUN 8 - 23 mg/dL 40  31     33   Creatinine 0.44 - 1.00 mg/dL 5.36  0.9     6.44   Sodium 135 - 145 mmol/L 137  136     142   Potassium 3.5 - 5.1 mmol/L 4.6  4.3     4.1   Chloride 98 - 111 mmol/L 104  103     108   CO2 22 - 32 mmol/L 22  26     24    Calcium 8.9 - 10.3 mg/dL 8.7  9.1     8.7   Total Protein 6.5 - 8.1 g/dL 6.3     Total Bilirubin 0.3 - 1.2 mg/dL 0.6     Alkaline Phos 38 - 126 U/L 108  115       AST 15 - 41 U/L 23  38       ALT 0 - 44 U/L 19  44          This result is from an external source.   No results found for: "TOTALPROTELP", "ALBUMINELP", "A1GS", "A2GS", "BETS", "BETA2SER", "GAMS", "MSPIKE", "SPEI" Lab Results  Component Value Date   TIBC 236 (L) 12/24/2021   TIBC 299 04/27/2021   TIBC 290 04/27/2021   FERRITIN 263 12/24/2021   FERRITIN 108 04/27/2021   FERRITIN 98 04/27/2021   IRONPCTSAT 29 12/24/2021   IRONPCTSAT 20 04/27/2021   IRONPCTSAT 19 04/27/2021   Lab Results  Component Value Date   LDH 1,016 (A) 04/03/2020   STUDIES:    HISTORY:   Allergies:  Allergies  Allergen Reactions   Bee Venom     Unknown    Current Medications: Current Outpatient Medications  Medication Sig Dispense Refill   albuterol (VENTOLIN HFA) 108 (90 Base) MCG/ACT inhaler Inhale into the lungs.     allopurinol (ZYLOPRIM) 100 MG tablet Take 150 mg by mouth daily. For gout     ALPRAZolam (XANAX) 0.25 MG tablet Take 0.25 mg by mouth at bedtime.     atorvastatin (LIPITOR) 40 MG tablet TAKE 1 TABLET (40 MG TOTAL) BY MOUTH DAILY. PT NEEDS OV FOR FURTHER REFILLS (Patient taking differently: Take 1 tablet by mouth every evening.)  90 tablet 1   busPIRone (BUSPAR) 15 MG tablet Take 1 tablet (15 mg total) by mouth 3 (three) times daily. Pt needs OV for further refills 270 tablet 0   Cholecalciferol 25 MCG (1000 UT) tablet Take 1 tablet by mouth daily.     diclofenac Sodium (VOLTAREN) 1 % GEL Apply 4 g topically 4 (four) times daily as needed (knee pain).     docusate sodium (COLACE) 100 MG capsule Take 100 mg by mouth 2 (two) times daily.     gabapentin (NEURONTIN) 100 MG capsule Take by mouth.     HYDROmorphone (DILAUDID) 4 MG tablet Take 4 mg by mouth every 8 (eight) hours as needed for severe pain.     ipratropium (ATROVENT) 0.06 % nasal spray Place  2 sprays into both nostrils in the morning and at bedtime.     LORazepam (ATIVAN) 0.5 MG tablet Take 0.5 mg by mouth every 6 (six) hours as needed for anxiety.     metoCLOPramide (REGLAN) 5 MG tablet Take 5 mg by mouth daily.     Multiple Vitamin (MULTI-VITAMIN) tablet Take 1 tablet by mouth daily.     pantoprazole (PROTONIX) 40 MG tablet TAKE 1 TABLET BY MOUTH EVERY DAY (Patient taking differently: Take 40 mg by mouth daily.) 90 tablet 1   potassium chloride (KLOR-CON M) 10 MEQ tablet Take 1 tablet (10 mEq total) by mouth daily.     Sennosides-Docusate Sodium 8.6-50 MG CAPS Take 1 tablet by mouth daily.     sertraline (ZOLOFT) 50 MG tablet TAKE 1 AND 1/2 TABLETS DAILY BY MOUTH (Patient taking differently: Take 50 mg by mouth 2 (two) times daily. TAKE 1 AND 1/2 TABLETS DAILY BY MOUTH) 135 tablet 1   torsemide (DEMADEX) 20 MG tablet Take 1 tablet (20 mg total) by mouth daily.     traZODone (DESYREL) 50 MG tablet Take 50 mg by mouth at bedtime.     No current facility-administered medications for this visit.     ASSESSMENT & PLAN:  Assessment:   1.  Myelofibrosis, diagnosed in August 2018. This likely will not threaten her life, and so only routine surveillance is recommended.  She has pancytopenia, but her counts will chronically be low and there is no specific therapy for  her. Her blood counts were better than usual last time.  2.  Severe lower extremity edema.   3.  Vascular dementia. She seems to be doing fairly well today with her mental status and speech.  4.  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    Plan: Her labs from the last time looked better with her hemoglobin up to 10.6 and her platelet count increased from 52,000 to 90,000. Her labs are pending today and I will call her with the results. I reviewed her examination findings with her and she is doing well and we need to keep monitoring her blood. She has physical therapy a few days a week to exercise her legs and I advised that she should continue with it. I will see her  back in 6 months with CBC, CMP, iron, TIBC, and ferritin.  She and her daughter understand and agree with this plan of care.  I provided 20 minutes of face-to-face time during this this encounter and > 50% was spent counseling as documented under my assessment and plan.   Dellia Beckwith, MD [facility] [address] [place] [address] Dept: [phone] Dept Fax: [phone]   No orders of the defined types were placed in this encounter.   I,Jasmine M Lassiter,acting as a scribe for Dellia Beckwith, MD.,have documented all relevant documentation on the behalf of Dellia Beckwith, MD,as directed by  Dellia Beckwith, MD while in the presence of Dellia Beckwith, MD.

## 2023-04-29 ENCOUNTER — Inpatient Hospital Stay: Payer: Medicaid Other

## 2023-04-29 ENCOUNTER — Inpatient Hospital Stay: Payer: Medicaid Other | Admitting: Oncology

## 2023-05-18 ENCOUNTER — Inpatient Hospital Stay: Payer: No Typology Code available for payment source | Admitting: Oncology

## 2023-05-18 ENCOUNTER — Inpatient Hospital Stay: Payer: No Typology Code available for payment source

## 2023-05-25 NOTE — Progress Notes (Unsigned)
 [name] [address] [place] [address] Phone: [phone] Subjective:   Bruce Donath, am serving as a scribe for Dr. Antoine Primas.  I'm seeing this patient by the request  of:  Garlan Fillers, MD  CC: knee pain   [id number]  03/17/2023 Chronic problem.  Discussed exacerbation, we discussed avoiding certain activities.  Discussed which activities to do and which ones to avoid.  Patient is a daughter and we discussed the progression of the severity of this.  Knee injections do help for to complain quite as much do a significant amount of walking.  Can have repeat injections every 10 weeks if necessary.  Patient's last blood draw did show the patient actually had what appeared to be pancytopenia. Encouraged her to have a lab draw and will check with primary care provider. Discussed with patient's daughter.   Updated 05/26/2023 Erin [name] is a 78 y.o. female coming in with complaint of B knee pain. Patient states that her knees are the same as last visit. Weather has made them sore over past 2 days.        Past Medical History:  Diagnosis Date   Asthma    CHF (congestive heart failure) (HCC)    hospital 11/2013-- [place]   Chronic kidney disease    Depression    Diabetes mellitus (HCC)    GERD (gastroesophageal reflux disease)    Hyperlipidemia    Hypertension    Stroke [facility])    Past Surgical History:  Procedure Laterality Date   CHOLECYSTECTOMY  2002   LOOP RECORDER INSERTION N/A 08/01/2017   Procedure: LOOP RECORDER INSERTION;  Surgeon: Regan Lemming, MD;  Location: [facility] INVASIVE CV LAB;  Service: Cardiovascular;  Laterality: N/A;   SPINE SURGERY  aug 2015   [facility]   TEE WITHOUT CARDIOVERSION N/A 08/01/2017   Procedure: TRANSESOPHAGEAL ECHOCARDIOGRAM (TEE);  Surgeon: Thurmon Fair, MD;  Location: [facility] ENDOSCOPY;  Service: Cardiovascular;  Laterality: N/A;   TONSILLECTOMY     TUBAL LIGATION      Social History   Socioeconomic History   Marital status: Widowed    Spouse name: Not on file   Number of children: 4   Years of education: 7   Highest education level: 12th grade  Occupational History   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, materials: [employer]  Tobacco Use   Smoking status: Former    Current packs/day: 0.00    Average packs/day: 1.5 packs/day for 20.0 years (30.0 ttl pk-yrs)    Types: Cigarettes    Start date: 08/02/1964    Quit date: 08/02/1984    Years since quitting: 38.8   Smokeless tobacco: Never  Vaping Use   Vaping status: Never Used  Substance and Sexual Activity   Alcohol use: No   Drug use: No   Sexual activity: Not Currently    Partners: Male  Other Topics Concern   Not on file  Social History Narrative   NO REG EXERCISE   Caffeine use: daily   Social Drivers of Health   Financial Resource Strain: Low Risk  (09/05/2020)   Overall Financial Resource Strain (CARDIA)    Difficulty of Paying Living Expenses: Not hard at all  Food Insecurity: No Food Insecurity (09/05/2020)   Hunger Vital Sign    Worried About Running Out of Food in the Last Year: Never true    Ran Out of Food in the Last Year: Never true  Transportation  Needs: No Transportation Needs (09/05/2020)   PRAPARE - Administrator, Civil Service (Medical): No    Lack of Transportation (Non-Medical): No  Physical Activity: Inactive (09/05/2020)   Exercise Vital Sign    Days of Exercise per Week: 0 days    Minutes of Exercise per Session: 0 min  Stress: No Stress Concern Present (09/05/2020)   [employer] of Occupational Health - Occupational Stress Questionnaire    Feeling of Stress : Only a little  Social Connections: Socially Isolated (09/05/2020)   Social Connection and Isolation Panel [NHANES]    Frequency of Communication with Friends and Family: More than three times a week    Frequency of Social Gatherings with Friends and Family: More than three times a week     Attends Religious Services: Never    Database administrator or Organizations: No    Attends Banker Meetings: Never    Marital Status: Widowed   Allergies  Allergen Reactions   Bee Venom     Unknown   Family History  Problem Relation Age of Onset   Hypertension Mother    Alzheimer's disease Mother    Hyperlipidemia Mother    Heart disease Father        cad   Asthma Father    Cancer Father 64       leukemia   Stroke Father    Hypertension Father    Heart disease Sister 62       MI   Pulmonary fibrosis Sister    Arthritis Brother    Heart disease Maternal Uncle    Stroke Maternal Uncle    Uterine cancer Paternal Grandmother    Stomach cancer Paternal Grandfather    Heart disease Maternal Uncle    Heart disease Maternal Uncle    Cancer Maternal Aunt        leukemia     Current Outpatient Medications (Cardiovascular):    atorvastatin (LIPITOR) 40 MG tablet, TAKE 1 TABLET (40 MG TOTAL) BY MOUTH DAILY. PT NEEDS OV FOR FURTHER REFILLS (Patient taking differently: Take 1 tablet by mouth every evening.)   torsemide (DEMADEX) 20 MG tablet, Take 1 tablet (20 mg total) by mouth daily.  Current Outpatient Medications (Respiratory):    albuterol (VENTOLIN HFA) 108 (90 Base) MCG/ACT inhaler, Inhale into the lungs.   ipratropium (ATROVENT) 0.06 % nasal spray, Place 2 sprays into both nostrils in the morning and at bedtime.  Current Outpatient Medications (Analgesics):    allopurinol (ZYLOPRIM) 100 MG tablet, Take 150 mg by mouth daily. For gout   HYDROmorphone (DILAUDID) 4 MG tablet, Take 4 mg by mouth every 8 (eight) hours as needed for severe pain.   Current Outpatient Medications (Other):    ALPRAZolam (XANAX) 0.25 MG tablet, Take 0.25 mg by mouth at bedtime.   busPIRone (BUSPAR) 15 MG tablet, Take 1 tablet (15 mg total) by mouth 3 (three) times daily. Pt needs OV for further refills   Cholecalciferol 25 MCG (1000 UT) tablet, Take 1 tablet by mouth daily.    diclofenac Sodium (VOLTAREN) 1 % GEL, Apply 4 g topically 4 (four) times daily as needed (knee pain).   docusate sodium (COLACE) 100 MG capsule, Take 100 mg by mouth 2 (two) times daily.   gabapentin (NEURONTIN) 100 MG capsule, Take by mouth.   LORazepam (ATIVAN) 0.5 MG tablet, Take 0.5 mg by mouth every 6 (six) hours as needed for anxiety.   metoCLOPramide (REGLAN) 5 MG tablet, Take 5 mg  by mouth daily.   Multiple Vitamin (MULTI-VITAMIN) tablet, Take 1 tablet by mouth daily.   pantoprazole (PROTONIX) 40 MG tablet, TAKE 1 TABLET BY MOUTH EVERY DAY (Patient taking differently: Take 40 mg by mouth daily.)   potassium chloride (KLOR-CON M) 10 MEQ tablet, Take 1 tablet (10 mEq total) by mouth daily.   Sennosides-Docusate Sodium 8.6-50 MG CAPS, Take 1 tablet by mouth daily.   sertraline (ZOLOFT) 50 MG tablet, TAKE 1 AND 1/2 TABLETS DAILY BY MOUTH (Patient taking differently: Take 50 mg by mouth 2 (two) times daily. TAKE 1 AND 1/2 TABLETS DAILY BY MOUTH)   traZODone (DESYREL) 50 MG tablet, Take 50 mg by mouth at bedtime.    Objective  Blood pressure (!) 140/72, pulse 88, height 5\' 2"  (1.575 m), SpO2 98%.   General: No apparent distress alert patient does does not seem oriented. HEENT: Pupils equal, extraocular movements intact  Respiratory: Patient's speak in full sentences and does not appear short of breath  Knee exam shows severe arthritic changes Instability noted.  Patient is in a wheelchair  After informed written and verbal consent, patient was seated on exam table. Right knee was prepped with alcohol swab and utilizing anterolateral approach, patient's right knee space was injected with 4:1  marcaine 0.5%: Kenalog 40mg /dL. Patient tolerated the procedure well without immediate complications.  After informed written and verbal consent, patient was seated on exam table. Right knee was prepped with alcohol swab and utilizing anterolateral approach, patient's right knee space was injected with  4:1  marcaine 0.5%: Kenalog 40mg /dL. Patient tolerated the procedure well without immediate complications.   Impression and Recommendations:    The above documentation has been reviewed and is accurate and complete Judi Saa, DO

## 2023-05-26 ENCOUNTER — Encounter: Payer: Self-pay | Admitting: Family Medicine

## 2023-05-26 ENCOUNTER — Ambulatory Visit: Payer: Medicare Other | Admitting: Family Medicine

## 2023-05-26 VITALS — BP 140/72 | HR 88 | Ht 62.0 in

## 2023-05-26 DIAGNOSIS — M170 Bilateral primary osteoarthritis of knee: Secondary | ICD-10-CM | POA: Diagnosis not present

## 2023-05-26 NOTE — Patient Instructions (Signed)
Injected both knees today See me again in 8-10 weeks 

## 2023-05-26 NOTE — Assessment & Plan Note (Signed)
 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  Due to patient's difficulty with orientation and other comorbidities including history of CHF and diabetes as well as patient's severe dementia she is not a surgical candidate.  Patient also social determinant of health includes patient is highly inactive secondary to these problems.  Does seem to respond relatively well though to injections for patient does get improvement for approximately 6 to 8 weeks and worsening.  Discussed that we can repeat again in 10 weeks if needed but would like to further evaluate.

## 2023-06-09 NOTE — Progress Notes (Signed)
 [facility]  [address] [place],  [place]  [address] [phone]  Clinic Day: 06/10/2023  Referring physician: Garlan Fillers, MD   CHIEF COMPLAINT:  CC:  Myelofibrosis  Current Treatment:  Surveillance  HISTORY OF PRESENT ILLNESS:  Erin [name] is a 78 y.o. female who I saw many years ago for thrombocytosis and she was referred back to me in July of 2018 for thrombocytopenia.  She has also been found to have anemia and both her hemoglobin and platelet count have been decreasing.  Her hemoglobin was down to 9.8 in July 2018 with a platelet count of 74,000. Her white count was elevated at 13,000 with 81% neutrophils, 8% lymphocytes, 9% monocytes, 1% eosinophils, and 1% basophils.  However, when I rechecked these counts on July 26th, I found more of a left shift and her white count was up to 16.6.  Her hemoglobin was a little better at 11.2 with an MCV of 84 and platelet count of 83,000. This time her differential revealed 61% neutrophils, 16% bands, 6% lymphocytes, 3% monocytes, 1% basophils, 5% metamyelocytes, 6% myelocytes and 2% blasts.  A reticulocyte count was 5.4%, sed rate was 93 and LDH was markedly elevated at 1939.  No monoclonal spike was seen.  I reviewed her peripheral blood smear myself and did confirm a rare blast and promyelocyte.  She was understandably anxious since her father had some form of leukemia at age 23 and lived 5 years.  She thinks this may have been chronic myelogenous leukemia.  An aunt also had leukemia.  She does note dyspnea with exertion.  She does see a nephrologist in [facility] for chronic kidney disease, Toye Lufadeju, MD.  She also has venous stasis and has wraps on her legs.  She complains of pain of her lower back and legs at a 6/10.  When I saw her in August 2018, her white count was still elevated at 14.5 with 45% neutrophils, 19% bands, 9% lymphocytes, 1% monocytes, 2% eosinophils, 2% basophils, 8% metamyelocytes, 14%  myelocytes and 1 nucleated red blood cell per 100 WBC's.  Her hemoglobin was stable at 11.3 but her platelet count was mildly lower at 74,000 and a smear revealed occasional teardrops.  JAK 2 mutation was negative.  We therefore performed a bone marrow on August 22nd and this was consistent with myelofibrosis.  The cytogenetics revealed normal 30 XX chromosomes and PCR for bcr/ABL was negative.  The sample was very limited as the aspirate was mainly blood.  There was a predominance of granulocytes, red cells and occasional atypical megakaryocytes associated with reticulin fibrosis, for a diagnosis of myelofibrosis.  Since that time she has had a stroke, and she has worsening vascular dementia.    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 This has caused her blood counts to be chronically low, and does not require treatment at this time. I would only transfuse her if her hemoglobin drops to 7.5 or less.   INTERVAL HISTORY:  Erin [name] is here for routine follow up for Myelofibrosis. Patient states that she feels well but complains of chronic bilateral knee pain. She receives cortisone injections but this gives her temporary relief. Her daughter informed me that she had dental work recently and had some teeth removed. She has a WBC of 4.4, a low hemoglobin of 9.1, but up from 8.5, and low platelet count of 95,000. Her CMP is normal other than an elevated creatinine of 1.21 with a  BUN of 49 and alkaline phosphatase of 127. I instructed her to increase her fluid intake. Her ferritin and iron studies today are pending. I will see her back in 6 months with CBC and CMP.  She denies signs of infection such as sore throat, sinus drainage, cough, or urinary symptoms.  She denies fevers or recurrent chills. She denies nausea, vomiting, chest pain, dyspnea or cough. Her appetite is ok she could not be weighed today. She is accompanied today by a family member.   REVIEW OF SYSTEMS:  Review of  Systems  Constitutional:  Positive for fatigue. Negative for appetite change, chills, diaphoresis, fever and unexpected weight change.  HENT:  Negative.  Negative for hearing loss, lump/mass, mouth sores, nosebleeds, sore throat, tinnitus, trouble swallowing and voice change.   Eyes: Negative.  Negative for eye problems and icterus.  Respiratory: Negative.  Negative for chest tightness, cough, hemoptysis, shortness of breath and wheezing.   Cardiovascular:  Positive for leg swelling (bilateral, severe). Negative for chest pain and palpitations.  Gastrointestinal: Negative.  Negative for abdominal distention, abdominal pain, blood in stool, constipation, diarrhea, nausea, rectal pain and vomiting.  Endocrine: Negative.   Genitourinary: Negative.  Negative for bladder incontinence, difficulty urinating, dyspareunia, dysuria, frequency, hematuria, menstrual problem, nocturia, pelvic pain, vaginal bleeding and vaginal discharge.   Musculoskeletal:  Positive for arthralgias (bilateral knees), back pain and gait problem (in a wheelchair). Negative for flank pain, myalgias, neck pain and neck stiffness.  Skin: Negative.  Negative for itching, rash and wound.  Neurological:  Positive for gait problem (in a wheelchair). Negative for dizziness, extremity weakness, headaches, light-headedness, numbness, seizures and speech difficulty.  Hematological:  Negative for adenopathy. Bruises/bleeds easily.  Psychiatric/Behavioral:  Positive for confusion. Negative for decreased concentration, depression, sleep disturbance and suicidal ideas. The patient is not nervous/anxious.      VITALS:  Blood pressure (!) 167/68, pulse 79, temperature 98.2 F (36.8 C), temperature source Oral, resp. rate 18, SpO2 100%.  Wt Readings from Last 3 Encounters:  02/12/22 225 lb 1.4 oz (102.1 kg)  10/07/20 220 lb (99.8 kg)  08/26/20 215 lb 3.2 oz (97.6 kg)    There is no height or weight on file to calculate BMI.  Performance  status (ECOG): 2 - Symptomatic, <50% confined to bed  PHYSICAL EXAM:  Physical Exam Vitals and nursing note reviewed. Exam conducted with a chaperone present.  Constitutional:      General: She is not in acute distress.    Appearance: Normal appearance. She is normal weight. She is not ill-appearing, toxic-appearing or diaphoretic.  HENT:     Head: Normocephalic and atraumatic.     Right Ear: Tympanic membrane, ear canal and external ear normal. There is no impacted cerumen.     Left Ear: Tympanic membrane, ear canal and external ear normal. There is no impacted cerumen.     Nose: Nose normal. No congestion or rhinorrhea.     Mouth/Throat:     Mouth: Mucous membranes are moist. Mucous membranes are pale.     Pharynx: Oropharynx is clear. No oropharyngeal exudate or posterior oropharyngeal erythema.  Eyes:     General: No scleral icterus.       Right eye: No discharge.        Left eye: No discharge.     Extraocular Movements: Extraocular movements intact.     Conjunctiva/sclera: Conjunctivae normal.     Pupils: Pupils are equal, round, and reactive to light.  Neck:     Vascular:  No carotid bruit.  Cardiovascular:     Rate and Rhythm: Normal rate and regular rhythm.     Pulses: Normal pulses.     Heart sounds: Normal heart sounds. No murmur heard.    No friction rub. No gallop.  Pulmonary:     Effort: Pulmonary effort is normal. No respiratory distress.     Breath sounds: Normal breath sounds. No stridor. No wheezing, rhonchi or rales.  Chest:     Chest wall: No tenderness.  Abdominal:     General: Bowel sounds are normal. There is no distension.     Palpations: Abdomen is soft. There is no hepatomegaly, splenomegaly or mass.     Tenderness: There is no abdominal tenderness. There is no right CVA tenderness, left CVA tenderness, guarding or rebound.     Hernia: No hernia is present.     Comments: The liver edge is below the right coastal margin The spleen is 2-3 breaths below  the left coastal margin  Musculoskeletal:        General: Swelling present. No tenderness, deformity or signs of injury. Normal range of motion.     Cervical back: Normal range of motion and neck supple. No rigidity or tenderness.     Right lower leg: Edema present.     Left lower leg: Edema present.     Comments: 1-2+ edema R>L, with chronic venous stasis changes  Lymphadenopathy:     Cervical: No cervical adenopathy.  Skin:    General: Skin is warm and dry.     Coloration: Skin is pale. Skin is not jaundiced.     Findings: No bruising, erythema, lesion or rash.  Neurological:     General: No focal deficit present.     Mental Status: She is alert and oriented to person, place, and time. Mental status is at baseline.     Cranial Nerves: No cranial nerve deficit.     Sensory: No sensory deficit.     Motor: No weakness.     Coordination: Coordination normal.     Gait: Gait normal.     Deep Tendon Reflexes: Reflexes normal.  Psychiatric:        Mood and Affect: Mood is not anxious or depressed.        Speech: Speech is not rapid and pressured.        Thought Content: Thought content is not paranoid. Thought content does not include suicidal ideation.        Cognition and Memory: Cognition is impaired. She exhibits impaired recent memory.        Judgment: Judgment is not inappropriate.     LABS:      Latest Ref Rng & Units 06/10/2023   10:32 AM 10/27/2022   10:18 AM 04/28/2022   12:00 AM  CBC  WBC 4.0 - 10.5 K/uL 4.4  3.8  8.8      Hemoglobin 12.0 - 15.0 g/dL 9.1  8.5  40.9      Hematocrit 36.0 - 46.0 % 27.5  27.4  32      Platelets 150 - 400 K/uL 95  114  98         This result is from an external source.      Latest Ref Rng & Units 06/10/2023   10:32 AM 10/27/2022   10:18 AM 04/28/2022   12:00 AM  CMP  Glucose 70 - 99 mg/dL 811  914    BUN 8 - 23 mg/dL 49  40  31  Creatinine 0.44 - 1.00 mg/dL 4.09  8.11  0.9      Sodium 135 - 145 mmol/L 141  137  136      Potassium  3.5 - 5.1 mmol/L 4.3  4.6  4.3      Chloride 98 - 111 mmol/L 104  104  103      CO2 22 - 32 mmol/L 20  22  26       Calcium 8.9 - 10.3 mg/dL 9.4  8.7  9.1      Total Protein 6.5 - 8.1 g/dL 6.6  6.3    Total Bilirubin 0.0 - 1.2 mg/dL 0.5  0.6    Alkaline Phos 38 - 126 U/L 127  108  115      AST 15 - 41 U/L 24  23  38      ALT 0 - 44 U/L 15  19  44         This result is from an external source.     No results found for: "TOTALPROTELP", "ALBUMINELP", "A1GS", "A2GS", "BETS", "BETA2SER", "GAMS", "MSPIKE", "SPEI" Lab Results  Component Value Date   TIBC 249 (L) 06/10/2023   TIBC 236 (L) 12/24/2021   TIBC 299 04/27/2021   FERRITIN 150 06/10/2023   FERRITIN 263 12/24/2021   FERRITIN 108 04/27/2021   IRONPCTSAT 32 (H) 06/10/2023   IRONPCTSAT 29 12/24/2021   IRONPCTSAT 20 04/27/2021   Lab Results  Component Value Date   LDH 1,016 (A) 04/03/2020     STUDIES:      HISTORY:   Allergies:  Allergies  Allergen Reactions   Bee Venom     Unknown    Current Medications: Current Outpatient Medications  Medication Sig Dispense Refill   albuterol (VENTOLIN HFA) 108 (90 Base) MCG/ACT inhaler Inhale into the lungs.     allopurinol (ZYLOPRIM) 100 MG tablet Take 150 mg by mouth daily. For gout     ALPRAZolam (XANAX) 0.25 MG tablet Take 0.25 mg by mouth at bedtime.     atorvastatin (LIPITOR) 40 MG tablet TAKE 1 TABLET (40 MG TOTAL) BY MOUTH DAILY. PT NEEDS OV FOR FURTHER REFILLS (Patient taking differently: Take 1 tablet by mouth every evening.) 90 tablet 1   busPIRone (BUSPAR) 15 MG tablet Take 1 tablet (15 mg total) by mouth 3 (three) times daily. Pt needs OV for further refills 270 tablet 0   Cholecalciferol 25 MCG (1000 UT) tablet Take 1 tablet by mouth daily.     diclofenac Sodium (VOLTAREN) 1 % GEL Apply 4 g topically 4 (four) times daily as needed (knee pain).     docusate sodium (COLACE) 100 MG capsule Take 100 mg by mouth 2 (two) times daily.     gabapentin (NEURONTIN) 100 MG  capsule Take by mouth.     HYDROmorphone (DILAUDID) 4 MG tablet Take 4 mg by mouth every 8 (eight) hours as needed for severe pain.     ipratropium (ATROVENT) 0.06 % nasal spray Place 2 sprays into both nostrils in the morning and at bedtime.     LORazepam (ATIVAN) 0.5 MG tablet Take 0.5 mg by mouth every 6 (six) hours as needed for anxiety.     metoCLOPramide (REGLAN) 5 MG tablet Take 5 mg by mouth daily.     Multiple Vitamin (MULTI-VITAMIN) tablet Take 1 tablet by mouth daily.     pantoprazole (PROTONIX) 40 MG tablet TAKE 1 TABLET BY MOUTH EVERY DAY (Patient taking differently: Take 40 mg by mouth daily.) 90 tablet  1   potassium chloride (KLOR-CON M) 10 MEQ tablet Take 1 tablet (10 mEq total) by mouth daily.     Sennosides-Docusate Sodium 8.6-50 MG CAPS Take 1 tablet by mouth daily.     sertraline (ZOLOFT) 50 MG tablet TAKE 1 AND 1/2 TABLETS DAILY BY MOUTH (Patient taking differently: Take 50 mg by mouth 2 (two) times daily. TAKE 1 AND 1/2 TABLETS DAILY BY MOUTH) 135 tablet 1   torsemide (DEMADEX) 20 MG tablet Take 1 tablet (20 mg total) by mouth daily.     traZODone (DESYREL) 50 MG tablet Take 50 mg by mouth at bedtime.     No current facility-administered medications for this visit.   ASSESSMENT & PLAN:  Assessment:   1.  Myelofibrosis, diagnosed in August 2018. This likely will not threaten her life, and so only routine surveillance is recommended.  She has pancytopenia, but her counts will chronically be low and there is no specific therapy for her. Her blood counts were better than usual last time.  She is largely asymptomatic.  2.  Severe lower extremity edema.   3.  Vascular dementia. She seems to be doing fairly well today with her mental status and speech.  4.  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   Plan: She receives cortisone injections but this gives her temporary relief. Her daughter informed me that she had dental work recently and had some teeth removed.  She has a WBC of 4.4, a low hemoglobin of 9.1, but up from 8.5, and low platelet count of 95,000. Her CMP is normal other than an elevated creatinine of 1.21 with a BUN of 49 and alkaline phosphatase of 127. I instructed her to increase her fluid intake. Her ferritin and iron studies today are pending. I will see her back in 6 months with CBC and CMP.  She and her family member understand and agree with this plan of care.  I provided 17 minutes of face-to-face time during this this encounter and > 50% was spent counseling as documented under my assessment and plan.   Dellia Beckwith, MD [facility] - [facility] DE[facility] OF [facility] [address] [place] [address] Dept: [phone] Dept Fax: [phone]   No orders of the defined types were placed in this encounter.   I,Jasmine M Lassiter,acting as a scribe for Dellia Beckwith, MD.,have documented all relevant documentation on the behalf of Dellia Beckwith, MD,as directed by  Dellia Beckwith, MD while in the presence of Dellia Beckwith, MD.

## 2023-06-10 ENCOUNTER — Inpatient Hospital Stay (HOSPITAL_BASED_OUTPATIENT_CLINIC_OR_DEPARTMENT_OTHER): Payer: Medicare Other | Admitting: Oncology

## 2023-06-10 ENCOUNTER — Encounter: Payer: Self-pay | Admitting: Oncology

## 2023-06-10 ENCOUNTER — Other Ambulatory Visit: Payer: Self-pay | Admitting: Oncology

## 2023-06-10 ENCOUNTER — Inpatient Hospital Stay: Payer: Medicare Other | Attending: Oncology

## 2023-06-10 VITALS — BP 167/68 | HR 79 | Temp 98.2°F | Resp 18

## 2023-06-10 DIAGNOSIS — D7581 Myelofibrosis: Secondary | ICD-10-CM | POA: Insufficient documentation

## 2023-06-10 DIAGNOSIS — D473 Essential (hemorrhagic) thrombocythemia: Secondary | ICD-10-CM | POA: Diagnosis present

## 2023-06-10 DIAGNOSIS — Z8673 Personal history of transient ischemic attack (TIA), and cerebral infarction without residual deficits: Secondary | ICD-10-CM | POA: Insufficient documentation

## 2023-06-10 DIAGNOSIS — R410 Disorientation, unspecified: Secondary | ICD-10-CM | POA: Diagnosis not present

## 2023-06-10 DIAGNOSIS — D61818 Other pancytopenia: Secondary | ICD-10-CM | POA: Diagnosis not present

## 2023-06-10 DIAGNOSIS — R7402 Elevation of levels of lactic acid dehydrogenase (LDH): Secondary | ICD-10-CM | POA: Diagnosis not present

## 2023-06-10 DIAGNOSIS — M7989 Other specified soft tissue disorders: Secondary | ICD-10-CM | POA: Diagnosis not present

## 2023-06-10 DIAGNOSIS — R600 Localized edema: Secondary | ICD-10-CM | POA: Diagnosis not present

## 2023-06-10 DIAGNOSIS — R0609 Other forms of dyspnea: Secondary | ICD-10-CM | POA: Insufficient documentation

## 2023-06-10 DIAGNOSIS — D474 Osteomyelofibrosis: Secondary | ICD-10-CM | POA: Diagnosis not present

## 2023-06-10 DIAGNOSIS — R7989 Other specified abnormal findings of blood chemistry: Secondary | ICD-10-CM | POA: Diagnosis not present

## 2023-06-10 DIAGNOSIS — N189 Chronic kidney disease, unspecified: Secondary | ICD-10-CM | POA: Diagnosis not present

## 2023-06-10 DIAGNOSIS — M549 Dorsalgia, unspecified: Secondary | ICD-10-CM | POA: Diagnosis not present

## 2023-06-10 DIAGNOSIS — M51372 Other intervertebral disc degeneration, lumbosacral region with discogenic back pain and lower extremity pain: Secondary | ICD-10-CM | POA: Insufficient documentation

## 2023-06-10 DIAGNOSIS — Z91030 Bee allergy status: Secondary | ICD-10-CM | POA: Insufficient documentation

## 2023-06-10 DIAGNOSIS — D6959 Other secondary thrombocytopenia: Secondary | ICD-10-CM | POA: Insufficient documentation

## 2023-06-10 DIAGNOSIS — M2550 Pain in unspecified joint: Secondary | ICD-10-CM | POA: Diagnosis not present

## 2023-06-10 DIAGNOSIS — Z79899 Other long term (current) drug therapy: Secondary | ICD-10-CM | POA: Diagnosis not present

## 2023-06-10 DIAGNOSIS — R5383 Other fatigue: Secondary | ICD-10-CM | POA: Diagnosis not present

## 2023-06-10 DIAGNOSIS — F0150 Vascular dementia without behavioral disturbance: Secondary | ICD-10-CM | POA: Insufficient documentation

## 2023-06-10 DIAGNOSIS — D509 Iron deficiency anemia, unspecified: Secondary | ICD-10-CM

## 2023-06-10 LAB — CBC WITH DIFFERENTIAL (CANCER CENTER ONLY)
Abs Immature Granulocytes: 0.07 10*3/uL (ref 0.00–0.07)
Basophils Absolute: 0 10*3/uL (ref 0.0–0.1)
Basophils Relative: 1 %
Eosinophils Absolute: 0 10*3/uL (ref 0.0–0.5)
Eosinophils Relative: 1 %
HCT: 27.5 % — ABNORMAL LOW (ref 36.0–46.0)
Hemoglobin: 9.1 g/dL — ABNORMAL LOW (ref 12.0–15.0)
Immature Granulocytes: 2 %
Lymphocytes Relative: 12 %
Lymphs Abs: 0.5 10*3/uL — ABNORMAL LOW (ref 0.7–4.0)
MCH: 29.4 pg (ref 26.0–34.0)
MCHC: 33.1 g/dL (ref 30.0–36.0)
MCV: 89 fL (ref 80.0–100.0)
Monocytes Absolute: 0.3 10*3/uL (ref 0.1–1.0)
Monocytes Relative: 6 %
Neutro Abs: 3.5 10*3/uL (ref 1.7–7.7)
Neutrophils Relative %: 78 %
Platelet Count: 95 10*3/uL — ABNORMAL LOW (ref 150–400)
RBC: 3.09 MIL/uL — ABNORMAL LOW (ref 3.87–5.11)
RDW: 18.2 % — ABNORMAL HIGH (ref 11.5–15.5)
WBC Count: 4.4 10*3/uL (ref 4.0–10.5)
nRBC: 0.02 % (ref 0.0–0.2)
nRBC: 1 /100{WBCs} — ABNORMAL HIGH

## 2023-06-10 LAB — CMP (CANCER CENTER ONLY)
ALT: 15 U/L (ref 0–44)
AST: 24 U/L (ref 15–41)
Albumin: 4.1 g/dL (ref 3.5–5.0)
Alkaline Phosphatase: 127 U/L — ABNORMAL HIGH (ref 38–126)
Anion gap: 17 — ABNORMAL HIGH (ref 5–15)
BUN: 49 mg/dL — ABNORMAL HIGH (ref 8–23)
CO2: 20 mmol/L — ABNORMAL LOW (ref 22–32)
Calcium: 9.4 mg/dL (ref 8.9–10.3)
Chloride: 104 mmol/L (ref 98–111)
Creatinine: 1.21 mg/dL — ABNORMAL HIGH (ref 0.44–1.00)
GFR, Estimated: 46 mL/min — ABNORMAL LOW (ref >60)
Glucose, Bld: 138 mg/dL — ABNORMAL HIGH (ref 70–99)
Potassium: 4.3 mmol/L (ref 3.5–5.1)
Sodium: 141 mmol/L (ref 135–145)
Total Bilirubin: 0.5 mg/dL (ref 0.0–1.2)
Total Protein: 6.6 g/dL (ref 6.5–8.1)

## 2023-06-10 LAB — IRON AND TIBC
Iron: 79 ug/dL (ref 28–170)
Saturation Ratios: 32 % — ABNORMAL HIGH (ref 10.4–31.8)
TIBC: 249 ug/dL — ABNORMAL LOW (ref 250–450)
UIBC: 170 ug/dL

## 2023-06-10 LAB — FERRITIN: Ferritin: 150 ng/mL (ref 11–307)

## 2023-07-25 NOTE — Progress Notes (Unsigned)
 [name] [address] [place] [address] Phone: [phone] Subjective:   Erin [name], am serving as a scribe for Dr. Ronnell Coins.  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  Due to patient's difficulty with orientation and other comorbidities including history of CHF and diabetes as well as patient's severe dementia she is not a surgical candidate.  Patient also social determinant of health includes patient is highly inactive secondary to these problems.  Does seem to respond relatively well though to injections for patient does get improvement for approximately 6 to 8 weeks and worsening.  Discussed that we can repeat again in 10 weeks if needed but would like to further evaluate.   Updated 07/27/2023 Erin [name] is a 78 y.o. female coming in with complaint of B knee pain. Patient states njectec        Past Medical History:  Diagnosis Date   Asthma    CHF (congestive heart failure) (HCC)    hospital 11/2013-- [place]   Chronic kidney disease    Depression    Diabetes mellitus (HCC)    GERD (gastroesophageal reflux disease)    Hyperlipidemia    Hypertension    Stroke [facility])    Past Surgical History:  Procedure Laterality Date   CHOLECYSTECTOMY  2002   LOOP RECORDER INSERTION N/A 08/01/2017   Procedure: LOOP RECORDER INSERTION;  Surgeon: Lei Pump, MD;  Location: [facility] INVASIVE CV LAB;  Service: Cardiovascular;  Laterality: N/A;   SPINE SURGERY  aug 2015   [facility]   TEE WITHOUT CARDIOVERSION N/A 08/01/2017   Procedure: TRANSESOPHAGEAL ECHOCARDIOGRAM (TEE);  Surgeon: Luana Rumple, MD;  Location: [facility] ENDOSCOPY;  Service: Cardiovascular;  Laterality: N/A;   TONSILLECTOMY     TUBAL LIGATION     Social History   Socioeconomic History   Marital status:  Widowed    Spouse name: Not on file   Number of children: 4   Years of education: 53   Highest education level: 12th grade  Occupational History   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, materials: [employer]  Tobacco Use   Smoking status: Former    Current packs/day: 0.00    Average packs/day: 1.5 packs/day for 20.0 years (30.0 ttl pk-yrs)    Types: Cigarettes    Start date: 08/02/1964    Quit date: 08/02/1984    Years since quitting: 39.0   Smokeless tobacco: Never  Vaping Use   Vaping status: Never Used  Substance and Sexual Activity   Alcohol use: No   Drug use: No   Sexual activity: Not Currently    Partners: Male  Other Topics Concern   Not on file  Social History Narrative   NO REG EXERCISE   Caffeine use: daily   Social Drivers of Health   Financial Resource Strain: Low Risk  (09/05/2020)   Overall Financial Resource Strain (CARDIA)    Difficulty of Paying Living Expenses: Not hard at all  Food Insecurity: No Food Insecurity (09/05/2020)   Hunger Vital Sign    Worried About Running Out of Food in the Last Year: Never true    Ran Out of Food in the Last Year: Never true  Transportation Needs: No Transportation Needs (09/05/2020)   PRAPARE - Administrator, Civil Service (Medical): No  Lack of Transportation (Non-Medical): No  Physical Activity: Inactive (09/05/2020)   Exercise Vital Sign    Days of Exercise per Week: 0 days    Minutes of Exercise per Session: 0 min  Stress: No Stress Concern Present (09/05/2020)   [employer] of Occupational Health - Occupational Stress Questionnaire    Feeling of Stress : Only a little  Social Connections: Socially Isolated (09/05/2020)   Social Connection and Isolation Panel [NHANES]    Frequency of Communication with Friends and Family: More than three times a week    Frequency of Social Gatherings with Friends and Family: More than three times a week    Attends Religious Services: Never    Database administrator or  Organizations: No    Attends Banker Meetings: Never    Marital Status: Widowed   Allergies  Allergen Reactions   Bee Venom     Unknown   Family History  Problem Relation Age of Onset   Hypertension Mother    Alzheimer's disease Mother    Hyperlipidemia Mother    Heart disease Father        cad   Asthma Father    Cancer Father 40       leukemia   Stroke Father    Hypertension Father    Heart disease Sister 41       MI   Pulmonary fibrosis Sister    Arthritis Brother    Heart disease Maternal Uncle    Stroke Maternal Uncle    Uterine cancer Paternal Grandmother    Stomach cancer Paternal Grandfather    Heart disease Maternal Uncle    Heart disease Maternal Uncle    Cancer Maternal Aunt        leukemia     Current Outpatient Medications (Cardiovascular):    atorvastatin  (LIPITOR) 40 MG tablet, TAKE 1 TABLET (40 MG TOTAL) BY MOUTH DAILY. PT NEEDS OV FOR FURTHER REFILLS (Patient taking differently: Take 1 tablet by mouth every evening.)   torsemide  (DEMADEX ) 20 MG tablet, Take 1 tablet (20 mg total) by mouth daily.  Current Outpatient Medications (Respiratory):    albuterol  (VENTOLIN  HFA) 108 (90 Base) MCG/ACT inhaler, Inhale into the lungs.   ipratropium (ATROVENT ) 0.06 % nasal spray, Place 2 sprays into both nostrils in the morning and at bedtime.  Current Outpatient Medications (Analgesics):    allopurinol  (ZYLOPRIM ) 100 MG tablet, Take 150 mg by mouth daily. For gout   HYDROmorphone (DILAUDID) 4 MG tablet, Take 4 mg by mouth every 8 (eight) hours as needed for severe pain.   Current Outpatient Medications (Other):    ALPRAZolam  (XANAX ) 0.25 MG tablet, Take 0.25 mg by mouth at bedtime.   busPIRone  (BUSPAR ) 15 MG tablet, Take 1 tablet (15 mg total) by mouth 3 (three) times daily. Pt needs OV for further refills   Cholecalciferol 25 MCG (1000 UT) tablet, Take 1 tablet by mouth daily.   diclofenac  Sodium (VOLTAREN ) 1 % GEL, Apply 4 g topically 4 (four)  times daily as needed (knee pain).   docusate sodium  (COLACE) 100 MG capsule, Take 100 mg by mouth 2 (two) times daily.   gabapentin  (NEURONTIN ) 100 MG capsule, Take by mouth.   LORazepam (ATIVAN) 0.5 MG tablet, Take 0.5 mg by mouth every 6 (six) hours as needed for anxiety.   metoCLOPramide (REGLAN) 5 MG tablet, Take 5 mg by mouth daily.   Multiple Vitamin (MULTI-VITAMIN) tablet, Take 1 tablet by mouth daily.   pantoprazole  (PROTONIX ) 40 MG  tablet, TAKE 1 TABLET BY MOUTH EVERY DAY (Patient taking differently: Take 40 mg by mouth daily.)   potassium chloride  (KLOR-CON  M) 10 MEQ tablet, Take 1 tablet (10 mEq total) by mouth daily.   Sennosides-Docusate Sodium  8.6-50 MG CAPS, Take 1 tablet by mouth daily.   sertraline  (ZOLOFT ) 50 MG tablet, TAKE 1 AND 1/2 TABLETS DAILY BY MOUTH (Patient taking differently: Take 50 mg by mouth 2 (two) times daily. TAKE 1 AND 1/2 TABLETS DAILY BY MOUTH)   traZODone  (DESYREL ) 50 MG tablet, Take 50 mg by mouth at bedtime.   Reviewed prior external information including notes and imaging from  primary care provider As well as notes that were available from care everywhere and other healthcare systems.  Past medical history, social, surgical and family history all reviewed in electronic medical record.  No pertanent information unless stated regarding to the chief complaint.   Review of Systems:  No headache, visual changes, nausea, vomiting, diarrhea, constipation, dizziness, abdominal pain, skin rash, fevers, chills, night sweats, weight loss, swollen lymph nodes, body aches, joint swelling, chest pain, shortness of breath, mood changes. POSITIVE muscle aches  Objective  Blood pressure 132/68, pulse 83, height 5\' 2"  (1.575 m), SpO2 98%.   General: No apparent distress alert not oriented HEENT: Pupils equal, extraocular movements intact  Respiratory: Patient's speak in full sentences and does not appear short of breath  Sitting in the wheel chair .  After  informed written and verbal consent, patient was seated on exam table. Right knee was prepped with alcohol swab and utilizing anterolateral approach, patient's right knee space was injected with 4:1  marcaine 0.5%: Kenalog  40mg /dL. Patient tolerated the procedure well without immediate complications.  After informed written and verbal consent, patient was seated on exam table. Left knee was prepped with alcohol swab and utilizing anterolateral approach, patient's left knee space was injected with 4:1  marcaine 0.5%: Kenalog  40mg /dL. Patient tolerated the procedure well without immediate complications.    Impression and Recommendations:    The above documentation has been reviewed and is accurate and complete Farley Crooker M Hawraa Stambaugh, DO

## 2023-07-27 ENCOUNTER — Ambulatory Visit: Payer: Medicare Other | Admitting: Family Medicine

## 2023-07-27 ENCOUNTER — Encounter: Payer: Self-pay | Admitting: Family Medicine

## 2023-07-27 VITALS — BP 132/68 | HR 83 | Ht 62.0 in

## 2023-07-27 DIAGNOSIS — M170 Bilateral primary osteoarthritis of knee: Secondary | ICD-10-CM | POA: Diagnosis not present

## 2023-07-27 NOTE — Assessment & Plan Note (Signed)
 Bilateral injections given.  Discussed icing regimen and home exercises, which activities to do.  Patient is having these injections for more of palliative aspect.  Patient does have significant memory issues.  Patient is physically not active in social determinants of health and lives in a assisted living facility.  Discussed with his care also with her relative.  Follow-up again in 8 to 10 weeks

## 2023-07-27 NOTE — Patient Instructions (Addendum)
 Injected both knees See me in 8-10 weeks

## 2023-09-20 NOTE — Progress Notes (Deleted)
 [name] [address] [place] [address] Phone: [phone] Subjective:    I'm seeing this patient by the request  of:  Erin Broad, MD  CC:   Erin [name]  07/27/2023 Bilateral injections given.  Discussed icing regimen and home exercises, which activities to do.  Patient is having these injections for more of palliative aspect.  Patient does have significant memory issues.  Patient is physically not active in social determinants of health and lives in a assisted living facility.  Discussed with his care also with her relative.  Follow-up again in 8 to 10 weeks     Updated 09/21/2023 Erin [name] is a 78 y.o. female coming in with complaint of B knee pain      Past Medical History:  Diagnosis Date   Asthma    CHF (congestive heart failure) [facility])    hospital 11/2013-- [place]   Chronic kidney disease    Depression    Diabetes mellitus (HCC)    GERD (gastroesophageal reflux disease)    Hyperlipidemia    Hypertension    Stroke [facility])    Past Surgical History:  Procedure Laterality Date   CHOLECYSTECTOMY  2002   LOOP RECORDER INSERTION N/A 08/01/2017   Procedure: LOOP RECORDER INSERTION;  Surgeon: Lei Pump, MD;  Location: [facility] INVASIVE CV LAB;  Service: Cardiovascular;  Laterality: N/A;   SPINE SURGERY  aug 2015   [facility]   TEE WITHOUT CARDIOVERSION N/A 08/01/2017   Procedure: TRANSESOPHAGEAL ECHOCARDIOGRAM (TEE);  Surgeon: Luana Rumple, MD;  Location: [facility] ENDOSCOPY;  Service: Cardiovascular;  Laterality: N/A;   TONSILLECTOMY     TUBAL LIGATION     Social History   Socioeconomic History   Marital status: Widowed    Spouse name: Not on file   Number of children: 4   Years of education: 15   Highest education level: 12th grade  Occupational History   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, materials: [employer]  Tobacco Use   Smoking status: Former    Current packs/day: 0.00    Average  packs/day: 1.5 packs/day for 20.0 years (30.0 ttl pk-yrs)    Types: Cigarettes    Start date: 08/02/1964    Quit date: 08/02/1984    Years since quitting: 39.1   Smokeless tobacco: Never  Vaping Use   Vaping status: Never Used  Substance and Sexual Activity   Alcohol use: No   Drug use: No   Sexual activity: Not Currently    Partners: Male  Other Topics Concern   Not on file  Social History Narrative   NO REG EXERCISE   Caffeine use: daily   Social Drivers of Health   Financial Resource Strain: Low Risk  (09/05/2020)   Overall Financial Resource Strain (CARDIA)    Difficulty of Paying Living Expenses: Not hard at all  Food Insecurity: No Food Insecurity (09/05/2020)   Hunger Vital Sign    Worried About Running Out of Food in the Last Year: Never true    Ran Out of Food in the Last Year: Never true  Transportation Needs: No Transportation Needs (09/05/2020)   PRAPARE - Administrator, Civil Service (Medical): No    Lack of Transportation (Non-Medical): No  Physical Activity: Inactive (09/05/2020)   Exercise Vital Sign    Days of Exercise per Week: 0 days    Minutes of Exercise per Session: 0 min  Stress: No Stress Concern Present (09/05/2020)  [employer] of Occupational Health - Occupational Stress Questionnaire    Feeling of Stress : Only a little  Social Connections: Socially Isolated (09/05/2020)   Social Connection and Isolation Panel    Frequency of Communication with Friends and Family: More than three times a week    Frequency of Social Gatherings with Friends and Family: More than three times a week    Attends Religious Services: Never    Database administrator or Organizations: No    Attends Banker Meetings: Never    Marital Status: Widowed   Allergies  Allergen Reactions   Bee Venom     Unknown   Family History  Problem Relation Age of Onset   Hypertension Mother    Alzheimer's disease Mother    Hyperlipidemia Mother    Heart  disease Father        cad   Asthma Father    Cancer Father 84       leukemia   Stroke Father    Hypertension Father    Heart disease Sister 1       MI   Pulmonary fibrosis Sister    Arthritis Brother    Heart disease Maternal Uncle    Stroke Maternal Uncle    Uterine cancer Paternal Grandmother    Stomach cancer Paternal Grandfather    Heart disease Maternal Uncle    Heart disease Maternal Uncle    Cancer Maternal Aunt        leukemia     Current Outpatient Medications (Cardiovascular):    atorvastatin  (LIPITOR) 40 MG tablet, TAKE 1 TABLET (40 MG TOTAL) BY MOUTH DAILY. PT NEEDS OV FOR FURTHER REFILLS (Patient taking differently: Take 1 tablet by mouth every evening.)   torsemide  (DEMADEX ) 20 MG tablet, Take 1 tablet (20 mg total) by mouth daily.  Current Outpatient Medications (Respiratory):    albuterol  (VENTOLIN  HFA) 108 (90 Base) MCG/ACT inhaler, Inhale into the lungs.   ipratropium (ATROVENT ) 0.06 % nasal spray, Place 2 sprays into both nostrils in the morning and at bedtime.  Current Outpatient Medications (Analgesics):    allopurinol  (ZYLOPRIM ) 100 MG tablet, Take 150 mg by mouth daily. For gout   HYDROmorphone (DILAUDID) 4 MG tablet, Take 4 mg by mouth every 8 (eight) hours as needed for severe pain.   Current Outpatient Medications (Other):    ALPRAZolam  (XANAX ) 0.25 MG tablet, Take 0.25 mg by mouth at bedtime.   busPIRone  (BUSPAR ) 15 MG tablet, Take 1 tablet (15 mg total) by mouth 3 (three) times daily. Pt needs OV for further refills   Cholecalciferol 25 MCG (1000 UT) tablet, Take 1 tablet by mouth daily.   diclofenac  Sodium (VOLTAREN ) 1 % GEL, Apply 4 g topically 4 (four) times daily as needed (knee pain).   docusate sodium  (COLACE) 100 MG capsule, Take 100 mg by mouth 2 (two) times daily.   gabapentin  (NEURONTIN ) 100 MG capsule, Take by mouth.   LORazepam (ATIVAN) 0.5 MG tablet, Take 0.5 mg by mouth every 6 (six) hours as needed for anxiety.   metoCLOPramide  (REGLAN) 5 MG tablet, Take 5 mg by mouth daily.   Multiple Vitamin (MULTI-VITAMIN) tablet, Take 1 tablet by mouth daily.   pantoprazole  (PROTONIX ) 40 MG tablet, TAKE 1 TABLET BY MOUTH EVERY DAY (Patient taking differently: Take 40 mg by mouth daily.)   potassium chloride  (KLOR-CON  M) 10 MEQ tablet, Take 1 tablet (10 mEq total) by mouth daily.   Sennosides-Docusate Sodium  8.6-50 MG CAPS, Take 1 tablet  by mouth daily.   sertraline  (ZOLOFT ) 50 MG tablet, TAKE 1 AND 1/2 TABLETS DAILY BY MOUTH (Patient taking differently: Take 50 mg by mouth 2 (two) times daily. TAKE 1 AND 1/2 TABLETS DAILY BY MOUTH)   traZODone  (DESYREL ) 50 MG tablet, Take 50 mg by mouth at bedtime.   Reviewed prior external information including notes and imaging from  primary care provider As well as notes that were available from care everywhere and other healthcare systems.  Past medical history, social, surgical and family history all reviewed in electronic medical record.  No pertanent information unless stated regarding to the chief complaint.   Review of Systems:  No headache, visual changes, nausea, vomiting, diarrhea, constipation, dizziness, abdominal pain, skin rash, fevers, chills, night sweats, weight loss, swollen lymph nodes, body aches, joint swelling, chest pain, shortness of breath, mood changes. POSITIVE muscle aches  Objective  There were no vitals taken for this visit.   General: No apparent distress alert and oriented x3 mood and affect normal, dressed appropriately.  HEENT: Pupils equal, extraocular movements intact  Respiratory: Patient's speak in full sentences and does not appear short of breath  Cardiovascular: No lower extremity edema, non tender, no erythema      Impression and Recommendations:

## 2023-09-21 ENCOUNTER — Ambulatory Visit: Admitting: Family Medicine

## 2023-11-01 NOTE — Progress Notes (Unsigned)
 [name] [address] [place] [address] Phone: [phone] Subjective:   ISusannah [name], am serving as a scribe for Dr. Arthea Claudene.  I'm seeing this patient by the request  of:  [name] MATSU, MD  CC: Bilateral knee pain  YEP:Dlagzrupcz  07/27/2023 Bilateral injections given.  Discussed icing regimen and home exercises, which activities to do.  Patient is having these injections for more of palliative aspect.  Patient does have significant memory issues.  Patient is physically not active in social determinants of health and lives in a assisted living facility.  Discussed with his care also with her relative.  Follow-up again in 8 to 10 weeks     Updated 11/02/2023 Erin [name] is a 78 y.o. female coming in with complaint of B knee pain. Doing okay.  Patient is accompanied with daughter and grandson.       Past Medical History:  Diagnosis Date   Asthma    CHF (congestive heart failure) (HCC)    hospital 11/2013-- [place]   Chronic kidney disease    Depression    Diabetes mellitus (HCC)    GERD (gastroesophageal reflux disease)    Hyperlipidemia    Hypertension    Stroke [facility])    Past Surgical History:  Procedure Laterality Date   CHOLECYSTECTOMY  2002   LOOP RECORDER INSERTION N/A 08/01/2017   Procedure: LOOP RECORDER INSERTION;  Surgeon: Inocencio Soyla Lunger, MD;  Location: [facility] INVASIVE CV LAB;  Service: Cardiovascular;  Laterality: N/A;   SPINE SURGERY  aug 2015   [facility]   TEE WITHOUT CARDIOVERSION N/A 08/01/2017   Procedure: TRANSESOPHAGEAL ECHOCARDIOGRAM (TEE);  Surgeon: Francyne Headland, MD;  Location: [facility] ENDOSCOPY;  Service: Cardiovascular;  Laterality: N/A;   TONSILLECTOMY     TUBAL LIGATION     Social History   Socioeconomic History   Marital status: Widowed    Spouse name: Not on file   Number of children: 4   Years of education: 34   Highest education level: 12th grade  Occupational History    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, materials: [employer]  Tobacco Use   Smoking status: Former    Current packs/day: 0.00    Average packs/day: 1.5 packs/day for 20.0 years (30.0 ttl pk-yrs)    Types: Cigarettes    Start date: 08/02/1964    Quit date: 08/02/1984    Years since quitting: 39.2   Smokeless tobacco: Never  Vaping Use   Vaping status: Never Used  Substance and Sexual Activity   Alcohol use: No   Drug use: No   Sexual activity: Not Currently    Partners: Male  Other Topics Concern   Not on file  Social History Narrative   NO REG EXERCISE   Caffeine use: daily   Social Drivers of Health   Financial Resource Strain: Low Risk  (09/05/2020)   Overall Financial Resource Strain (CARDIA)    Difficulty of Paying Living Expenses: Not hard at all  Food Insecurity: No Food Insecurity (09/05/2020)   Hunger Vital Sign    Worried About Running Out of Food in the Last Year: Never true    Ran Out of Food in the Last Year: Never true  Transportation Needs: No Transportation Needs (09/05/2020)   PRAPARE - Administrator, Civil Service (Medical): No    Lack of Transportation (Non-Medical): No  Physical Activity: Inactive (09/05/2020)   Exercise Vital Sign  Days of Exercise per Week: 0 days    Minutes of Exercise per Session: 0 min  Stress: No Stress Concern Present (09/05/2020)   [employer] of Occupational Health - Occupational Stress Questionnaire    Feeling of Stress : Only a little  Social Connections: Socially Isolated (09/05/2020)   Social Connection and Isolation Panel    Frequency of Communication with Friends and Family: More than three times a week    Frequency of Social Gatherings with Friends and Family: More than three times a week    Attends Religious Services: Never    Database administrator or Organizations: No    Attends Banker Meetings: Never    Marital Status: Widowed   Allergies  Allergen Reactions   Bee Venom     Unknown    Family History  Problem Relation Age of Onset   Hypertension Mother    Alzheimer's disease Mother    Hyperlipidemia Mother    Heart disease Father        cad   Asthma Father    Cancer Father 80       leukemia   Stroke Father    Hypertension Father    Heart disease Sister 61       MI   Pulmonary fibrosis Sister    Arthritis Brother    Heart disease Maternal Uncle    Stroke Maternal Uncle    Uterine cancer Paternal Grandmother    Stomach cancer Paternal Grandfather    Heart disease Maternal Uncle    Heart disease Maternal Uncle    Cancer Maternal Aunt        leukemia     Current Outpatient Medications (Cardiovascular):    atorvastatin  (LIPITOR) 40 MG tablet, TAKE 1 TABLET (40 MG TOTAL) BY MOUTH DAILY. PT NEEDS OV FOR FURTHER REFILLS (Patient taking differently: Take 1 tablet by mouth every evening.)   torsemide  (DEMADEX ) 20 MG tablet, Take 1 tablet (20 mg total) by mouth daily.  Current Outpatient Medications (Respiratory):    albuterol  (VENTOLIN  HFA) 108 (90 Base) MCG/ACT inhaler, Inhale into the lungs.   ipratropium (ATROVENT ) 0.06 % nasal spray, Place 2 sprays into both nostrils in the morning and at bedtime.  Current Outpatient Medications (Analgesics):    allopurinol  (ZYLOPRIM ) 100 MG tablet, Take 150 mg by mouth daily. For gout   HYDROmorphone (DILAUDID) 4 MG tablet, Take 4 mg by mouth every 8 (eight) hours as needed for severe pain.   Current Outpatient Medications (Other):    ALPRAZolam  (XANAX ) 0.25 MG tablet, Take 0.25 mg by mouth at bedtime.   busPIRone  (BUSPAR ) 15 MG tablet, Take 1 tablet (15 mg total) by mouth 3 (three) times daily. Pt needs OV for further refills   Cholecalciferol 25 MCG (1000 UT) tablet, Take 1 tablet by mouth daily.   diclofenac  Sodium (VOLTAREN ) 1 % GEL, Apply 4 g topically 4 (four) times daily as needed (knee pain).   docusate sodium  (COLACE) 100 MG capsule, Take 100 mg by mouth 2 (two) times daily.   gabapentin  (NEURONTIN ) 100 MG  capsule, Take by mouth.   LORazepam (ATIVAN) 0.5 MG tablet, Take 0.5 mg by mouth every 6 (six) hours as needed for anxiety.   metoCLOPramide (REGLAN) 5 MG tablet, Take 5 mg by mouth daily.   Multiple Vitamin (MULTI-VITAMIN) tablet, Take 1 tablet by mouth daily.   pantoprazole  (PROTONIX ) 40 MG tablet, TAKE 1 TABLET BY MOUTH EVERY DAY (Patient taking differently: Take 40 mg by mouth daily.)  potassium chloride  (KLOR-CON  M) 10 MEQ tablet, Take 1 tablet (10 mEq total) by mouth daily.   Sennosides-Docusate Sodium  8.6-50 MG CAPS, Take 1 tablet by mouth daily.   sertraline  (ZOLOFT ) 50 MG tablet, TAKE 1 AND 1/2 TABLETS DAILY BY MOUTH (Patient taking differently: Take 50 mg by mouth 2 (two) times daily. TAKE 1 AND 1/2 TABLETS DAILY BY MOUTH)   traZODone  (DESYREL ) 50 MG tablet, Take 50 mg by mouth at bedtime.   Objective  Height 5' 2 (1.575 m).   General: No apparent distress alert not oriented.  The patient is sitting in a wheelchair.  Patient does have edema noted to the lower extremities bilaterally.  After informed written and verbal consent, patient was seated on exam table. Right knee was prepped with alcohol swab and utilizing anterolateral approach, patient's right knee space was injected with 4:1  marcaine 0.5%: Kenalog  40mg /dL. Patient tolerated the procedure well without immediate complications.  After informed written and verbal consent, patient was seated on exam table. Left knee was prepped with alcohol swab and utilizing anterolateral approach, patient's left knee space was injected with 4:1  marcaine 0.5%: Kenalog  40mg /dL. Patient tolerated the procedure well without immediate complications.    Impression and Recommendations:     The above documentation has been reviewed and is accurate and complete Janiyla Long M Kainon Varady, DO

## 2023-11-02 ENCOUNTER — Ambulatory Visit: Admitting: Family Medicine

## 2023-11-02 ENCOUNTER — Encounter: Payer: Self-pay | Admitting: Family Medicine

## 2023-11-02 VITALS — Ht 62.0 in

## 2023-11-02 DIAGNOSIS — M170 Bilateral primary osteoarthritis of knee: Secondary | ICD-10-CM | POA: Diagnosis not present

## 2023-11-02 NOTE — Assessment & Plan Note (Signed)
 Chronic problem, palliative at this moment with patient's severe arthritis.  With comorbidities make it difficult to treat in any other way at the moment.  Discussed icing regimen at home exercises, increase activity slowly.  Follow-up again in 8 to 10 weeks otherwise.

## 2023-12-16 ENCOUNTER — Other Ambulatory Visit: Payer: Self-pay | Admitting: Hematology and Oncology

## 2023-12-16 ENCOUNTER — Inpatient Hospital Stay: Admitting: Oncology

## 2023-12-16 ENCOUNTER — Inpatient Hospital Stay

## 2023-12-16 ENCOUNTER — Inpatient Hospital Stay (HOSPITAL_BASED_OUTPATIENT_CLINIC_OR_DEPARTMENT_OTHER): Admitting: Hematology and Oncology

## 2023-12-16 ENCOUNTER — Inpatient Hospital Stay: Attending: Hematology and Oncology

## 2023-12-16 ENCOUNTER — Other Ambulatory Visit: Payer: Self-pay

## 2023-12-16 DIAGNOSIS — F32A Depression, unspecified: Secondary | ICD-10-CM | POA: Diagnosis not present

## 2023-12-16 DIAGNOSIS — D474 Osteomyelofibrosis: Secondary | ICD-10-CM

## 2023-12-16 DIAGNOSIS — R600 Localized edema: Secondary | ICD-10-CM | POA: Diagnosis not present

## 2023-12-16 DIAGNOSIS — D473 Essential (hemorrhagic) thrombocythemia: Secondary | ICD-10-CM

## 2023-12-16 DIAGNOSIS — Z79899 Other long term (current) drug therapy: Secondary | ICD-10-CM | POA: Diagnosis not present

## 2023-12-16 DIAGNOSIS — D7581 Myelofibrosis: Secondary | ICD-10-CM | POA: Insufficient documentation

## 2023-12-16 DIAGNOSIS — F0150 Vascular dementia without behavioral disturbance: Secondary | ICD-10-CM | POA: Diagnosis not present

## 2023-12-16 LAB — CBC WITH DIFFERENTIAL (CANCER CENTER ONLY)
Abs Immature Granulocytes: 0.17 K/uL — ABNORMAL HIGH (ref 0.00–0.07)
Basophils Absolute: 0 K/uL (ref 0.0–0.1)
Basophils Relative: 0 %
Eosinophils Absolute: 0 K/uL (ref 0.0–0.5)
Eosinophils Relative: 1 %
HCT: 23 % — ABNORMAL LOW (ref 36.0–46.0)
Hemoglobin: 7.1 g/dL — ABNORMAL LOW (ref 12.0–15.0)
Immature Granulocytes: 5 %
Lymphocytes Relative: 15 %
Lymphs Abs: 0.5 K/uL — ABNORMAL LOW (ref 0.7–4.0)
MCH: 29.1 pg (ref 26.0–34.0)
MCHC: 30.9 g/dL (ref 30.0–36.0)
MCV: 94.3 fL (ref 80.0–100.0)
Monocytes Absolute: 0.2 K/uL (ref 0.1–1.0)
Monocytes Relative: 7 %
Neutro Abs: 2.5 K/uL (ref 1.7–7.7)
Neutrophils Relative %: 72 %
Platelet Count: 143 K/uL — ABNORMAL LOW (ref 150–400)
RBC: 2.44 MIL/uL — ABNORMAL LOW (ref 3.87–5.11)
RDW: 16.9 % — ABNORMAL HIGH (ref 11.5–15.5)
WBC Count: 3.5 K/uL — ABNORMAL LOW (ref 4.0–10.5)
nRBC: 0.9 % — ABNORMAL HIGH (ref 0.0–0.2)

## 2023-12-16 LAB — CMP (CANCER CENTER ONLY)
ALT: 11 U/L (ref 0–44)
AST: 20 U/L (ref 15–41)
Albumin: 3.4 g/dL — ABNORMAL LOW (ref 3.5–5.0)
Alkaline Phosphatase: 107 U/L (ref 38–126)
Anion gap: 14 (ref 5–15)
BUN: 58 mg/dL — ABNORMAL HIGH (ref 8–23)
CO2: 21 mmol/L — ABNORMAL LOW (ref 22–32)
Calcium: 8.6 mg/dL — ABNORMAL LOW (ref 8.9–10.3)
Chloride: 102 mmol/L (ref 98–111)
Creatinine: 1.67 mg/dL — ABNORMAL HIGH (ref 0.44–1.00)
GFR, Estimated: 31 mL/min — ABNORMAL LOW (ref >60)
Glucose, Bld: 136 mg/dL — ABNORMAL HIGH (ref 70–99)
Potassium: 4.5 mmol/L (ref 3.5–5.1)
Sodium: 137 mmol/L (ref 135–145)
Total Bilirubin: 0.3 mg/dL (ref 0.0–1.2)
Total Protein: 6.6 g/dL (ref 6.5–8.1)

## 2023-12-16 LAB — SAMPLE TO BLOOD BANK

## 2023-12-16 LAB — ABO/RH: ABO/RH(D): O POS

## 2023-12-16 LAB — PREPARE RBC (CROSSMATCH)

## 2023-12-16 MED ORDER — TORSEMIDE 40 MG PO TABS: 40.0000 mg | ORAL_TABLET | ORAL | Status: AC

## 2023-12-16 MED ORDER — SERTRALINE HCL 50 MG PO TABS: 50.0000 mg | ORAL_TABLET | Freq: Every day | ORAL | Status: AC

## 2023-12-16 NOTE — Progress Notes (Signed)
 [facility]  [address] [place],  [place]  [address] [phone]  Clinic Day: 06/10/2023  Referring physician: [name] MATSU, MD   CHIEF COMPLAINT:  CC:  Myelofibrosis  Current Treatment:  Surveillance  HISTORY OF PRESENT ILLNESS:  Erin [name] is a 78 y.o. female who I saw many years ago for thrombocytosis and she was referred back to me in July of 2018 for thrombocytopenia.  She has also been found to have anemia and both her hemoglobin and platelet count have been decreasing.  Her hemoglobin was down to 9.8 in July 2018 with a platelet count of 74,000. Her white count was elevated at 13,000 with 81% neutrophils, 8% lymphocytes, 9% monocytes, 1% eosinophils, and 1% basophils.  However, when I rechecked these counts on July 26th, I found more of a left shift and her white count was up to 16.6.  Her hemoglobin was a little better at 11.2 with an MCV of 84 and platelet count of 83,000. This time her differential revealed 61% neutrophils, 16% bands, 6% lymphocytes, 3% monocytes, 1% basophils, 5% metamyelocytes, 6% myelocytes and 2% blasts.  A reticulocyte count was 5.4%, sed rate was 93 and LDH was markedly elevated at 1939.  No monoclonal spike was seen.  I reviewed her peripheral blood smear myself and did confirm a rare blast and promyelocyte.  She was understandably anxious since her father had some form of leukemia at age 67 and lived 5 years.  She thinks this may have been chronic myelogenous leukemia.  An aunt also had leukemia.  She does note dyspnea with exertion.  She does see a nephrologist in [facility] with [facility] for chronic kidney disease, Toye Lufadeju, MD.  She also has venous stasis and has wraps on her legs.  She complains of pain of her lower back and legs at a 6/10.  When I saw her in August 2018, her white count was still elevated at 14.5 with 45% neutrophils, 19% bands, 9% lymphocytes, 1% monocytes, 2% eosinophils, 2% basophils, 8% metamyelocytes, 14%  myelocytes and 1 nucleated red blood cell per 100 WBC's.  Her hemoglobin was stable at 11.3 but her platelet count was mildly lower at 74,000 and a smear revealed occasional teardrops.  JAK 2 mutation was negative.  We therefore performed a bone marrow on August 22nd and this was consistent with myelofibrosis.  The cytogenetics revealed normal 69 XX chromosomes and PCR for bcr/ABL was negative.  The sample was very limited as the aspirate was mainly blood.  There was a predominance of granulocytes, red cells and occasional atypical megakaryocytes associated with reticulin fibrosis, for a diagnosis of myelofibrosis.  Since that time she has had a stroke, and she has worsening vascular dementia.    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 This has caused her blood counts to be chronically low, and does not require treatment at this time. I would only transfuse her if her hemoglobin drops to 7.5 or less.   INTERVAL HISTORY:  Erin [name] is here for routine follow up for Myelofibrosis. She was recently hospitalized for kidney failure and is continuing to recover.  She denies signs of infection such as sore throat, sinus drainage, cough, or urinary symptoms.  She denies fevers or recurrent chills. She denies nausea, vomiting, chest pain, dyspnea or cough. Her appetite is ok she could not be weighed today. She is accompanied today by a family member.   REVIEW OF SYSTEMS:  Review of Systems  Constitutional:  Positive for fatigue. Negative for appetite change, chills, diaphoresis, fever and unexpected weight change.  HENT:  Negative.  Negative for hearing loss, lump/mass, mouth sores, nosebleeds, sore throat, tinnitus, trouble swallowing and voice change.   Eyes: Negative.  Negative for eye problems and icterus.  Respiratory: Negative.  Negative for chest tightness, cough, hemoptysis, shortness of breath and wheezing.   Cardiovascular:  Positive for leg swelling (bilateral, severe).  Negative for chest pain and palpitations.  Gastrointestinal: Negative.  Negative for abdominal distention, abdominal pain, blood in stool, constipation, diarrhea, nausea, rectal pain and vomiting.  Endocrine: Negative.   Genitourinary: Negative.  Negative for bladder incontinence, difficulty urinating, dyspareunia, dysuria, frequency, hematuria, menstrual problem, nocturia, pelvic pain, vaginal bleeding and vaginal discharge.   Musculoskeletal:  Positive for arthralgias (bilateral knees), back pain and gait problem (in a wheelchair). Negative for flank pain, myalgias, neck pain and neck stiffness.  Skin: Negative.  Negative for itching, rash and wound.  Neurological:  Positive for gait problem (in a wheelchair). Negative for dizziness, extremity weakness, headaches, light-headedness, numbness, seizures and speech difficulty.  Hematological:  Negative for adenopathy. Bruises/bleeds easily.  Psychiatric/Behavioral:  Positive for confusion. Negative for decreased concentration, depression, sleep disturbance and suicidal ideas. The patient is not nervous/anxious.      VITALS:  There were no vitals taken for this visit.  Wt Readings from Last 3 Encounters:  02/12/22 225 lb 1.4 oz (102.1 kg)  10/07/20 220 lb (99.8 kg)  08/26/20 215 lb 3.2 oz (97.6 kg)    There is no height or weight on file to calculate BMI.  Performance status (ECOG): 2 - Symptomatic, <50% confined to bed  PHYSICAL EXAM:  Physical Exam Vitals and nursing note reviewed. Exam conducted with a chaperone present.  Constitutional:      General: She is not in acute distress.    Appearance: Normal appearance. She is normal weight. She is not ill-appearing, toxic-appearing or diaphoretic.  HENT:     Head: Normocephalic and atraumatic.     Right Ear: Tympanic membrane, ear canal and external ear normal. There is no impacted cerumen.     Left Ear: Tympanic membrane, ear canal and external ear normal. There is no impacted cerumen.      Nose: Nose normal. No congestion or rhinorrhea.     Mouth/Throat:     Mouth: Mucous membranes are moist. Mucous membranes are pale.     Pharynx: Oropharynx is clear. No oropharyngeal exudate or posterior oropharyngeal erythema.  Eyes:     General: No scleral icterus.       Right eye: No discharge.        Left eye: No discharge.     Extraocular Movements: Extraocular movements intact.     Conjunctiva/sclera: Conjunctivae normal.     Pupils: Pupils are equal, round, and reactive to light.  Neck:     Vascular: No carotid bruit.  Cardiovascular:     Rate and Rhythm: Normal rate and regular rhythm.     Pulses: Normal pulses.     Heart sounds: Normal heart sounds. No murmur heard.    No friction rub. No gallop.  Pulmonary:     Effort: Pulmonary effort is normal. No respiratory distress.     Breath sounds: Normal breath sounds. No stridor. No wheezing, rhonchi or rales.  Chest:     Chest wall: No tenderness.  Abdominal:     General: Bowel sounds are normal. There is no distension.     Palpations: Abdomen is soft.  There is no hepatomegaly, splenomegaly or mass.     Tenderness: There is no abdominal tenderness. There is no right CVA tenderness, left CVA tenderness, guarding or rebound.     Hernia: No hernia is present.     Comments: The liver edge is below the right coastal margin The spleen is 2-3 breaths below the left coastal margin  Musculoskeletal:        General: Swelling present. No tenderness, deformity or signs of injury. Normal range of motion.     Cervical back: Normal range of motion and neck supple. No rigidity or tenderness.     Right lower leg: Edema present.     Left lower leg: Edema present.     Comments: 1-2+ edema R>L, with chronic venous stasis changes  Lymphadenopathy:     Cervical: No cervical adenopathy.  Skin:    General: Skin is warm and dry.     Coloration: Skin is pale. Skin is not jaundiced.     Findings: No bruising, erythema, lesion or rash.   Neurological:     General: No focal deficit present.     Mental Status: She is alert and oriented to person, place, and time. Mental status is at baseline.     Cranial Nerves: No cranial nerve deficit.     Sensory: No sensory deficit.     Motor: No weakness.     Coordination: Coordination normal.     Gait: Gait normal.     Deep Tendon Reflexes: Reflexes normal.  Psychiatric:        Mood and Affect: Mood is not anxious or depressed.        Speech: Speech is not rapid and pressured.        Thought Content: Thought content is not paranoid. Thought content does not include suicidal ideation.        Cognition and Memory: Cognition is impaired. She exhibits impaired recent memory.        Judgment: Judgment is not inappropriate.     LABS:      Latest Ref Rng & Units 12/16/2023    9:09 AM 06/10/2023   10:32 AM 10/27/2022   10:18 AM  CBC  WBC 4.0 - 10.5 K/uL 3.5  4.4  3.8   Hemoglobin 12.0 - 15.0 g/dL 7.1  9.1  8.5   Hematocrit 36.0 - 46.0 % 23.0  27.5  27.4   Platelets 150 - 400 K/uL 143  95  114       Latest Ref Rng & Units 12/16/2023    9:09 AM 06/10/2023   10:32 AM 10/27/2022   10:18 AM  CMP  Glucose 70 - 99 mg/dL 863  861  854   BUN 8 - 23 mg/dL 58  49  40   Creatinine 0.44 - 1.00 mg/dL 8.32  8.78  8.77   Sodium 135 - 145 mmol/L 137  141  137   Potassium 3.5 - 5.1 mmol/L 4.5  4.3  4.6   Chloride 98 - 111 mmol/L 102  104  104   CO2 22 - 32 mmol/L 21  20  22    Calcium  8.9 - 10.3 mg/dL 8.6  9.4  8.7   Total Protein 6.5 - 8.1 g/dL 6.6  6.6  6.3   Total Bilirubin 0.0 - 1.2 mg/dL 0.3  0.5  0.6   Alkaline Phos 38 - 126 U/L 107  127  108   AST 15 - 41 U/L 20  24  23    ALT 0 -  44 U/L 11  15  19       No results found for: TOTALPROTELP, ALBUMINELP, A1GS, A2GS, BETS, BETA2SER, GAMS, MSPIKE, SPEI Lab Results  Component Value Date   TIBC 249 (L) 06/10/2023   TIBC 236 (L) 12/24/2021   TIBC 299 04/27/2021   FERRITIN 150 06/10/2023   FERRITIN 263 12/24/2021    FERRITIN 108 04/27/2021   IRONPCTSAT 32 (H) 06/10/2023   IRONPCTSAT 29 12/24/2021   IRONPCTSAT 20 04/27/2021   Lab Results  Component Value Date   LDH 1,016 (A) 04/03/2020     STUDIES:      HISTORY:   Allergies:  Allergies  Allergen Reactions   Bee Venom     Unknown    Current Medications: Current Outpatient Medications  Medication Sig Dispense Refill   albuterol  (VENTOLIN  HFA) 108 (90 Base) MCG/ACT inhaler Inhale into the lungs.     allopurinol  (ZYLOPRIM ) 100 MG tablet Take 150 mg by mouth daily. For gout     ALPRAZolam  (XANAX ) 0.25 MG tablet Take 0.25 mg by mouth at bedtime.     Cholecalciferol 25 MCG (1000 UT) tablet Take 1 tablet by mouth daily.     diclofenac  Sodium (VOLTAREN ) 1 % GEL Apply 4 g topically 4 (four) times daily as needed (knee pain).     docusate sodium  (COLACE) 100 MG capsule Take 100 mg by mouth 2 (two) times daily.     gabapentin  (NEURONTIN ) 100 MG capsule Take by mouth.     hydrALAZINE  (APRESOLINE ) 10 MG tablet Take 10 mg by mouth 2 (two) times daily.     HYDROmorphone (DILAUDID) 4 MG tablet Take 4 mg by mouth every 8 (eight) hours as needed for severe pain.     ipratropium (ATROVENT ) 0.06 % nasal spray Place 2 sprays into both nostrils in the morning and at bedtime.     Multiple Vitamin (MULTI-VITAMIN) tablet Take 1 tablet by mouth daily.     pantoprazole  (PROTONIX ) 40 MG tablet TAKE 1 TABLET BY MOUTH EVERY DAY 90 tablet 1   Sennosides-Docusate Sodium  8.6-50 MG CAPS Take 1 tablet by mouth daily.     traZODone  (DESYREL ) 50 MG tablet Take 50 mg by mouth at bedtime.     atorvastatin  (LIPITOR) 40 MG tablet TAKE 1 TABLET (40 MG TOTAL) BY MOUTH DAILY. PT NEEDS OV FOR FURTHER REFILLS (Patient taking differently: Take 1 tablet by mouth every evening.) 90 tablet 1   busPIRone  (BUSPAR ) 15 MG tablet Take 1 tablet (15 mg total) by mouth 3 (three) times daily. Pt needs OV for further refills 270 tablet 0   LORazepam (ATIVAN) 0.5 MG tablet Take 0.5 mg by mouth  every 6 (six) hours as needed for anxiety.     metoCLOPramide (REGLAN) 5 MG tablet Take 5 mg by mouth daily.     potassium chloride  (KLOR-CON  M) 10 MEQ tablet Take 1 tablet (10 mEq total) by mouth daily.     sertraline  (ZOLOFT ) 50 MG tablet Take 1 tablet (50 mg total) by mouth daily.     torsemide  40 MG TABS Take 40 mg by mouth every Monday, Wednesday, and Friday.     No current facility-administered medications for this visit.   ASSESSMENT & PLAN:  Assessment:   1.  Myelofibrosis, diagnosed in August 2018. This likely will not threaten her life, and so only routine surveillance is recommended.  She has pancytopenia, but her counts will chronically be low and there is no specific therapy for her. Hemoglobin today is 7.1.  She is  largely asymptomatic.  2.  Severe lower extremity edema.   3.  Vascular dementia. She seems to be doing fairly well today with her mental status and speech.  4.  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   Plan: As her hemoglobin is below 7.5 today, we will plan for transfusion on Monday. She is mostly asymptomatic. She was recently discharged from the hospital for kidney failure and continues to recover. Her daughter-in-law was present today for her visit and discussed the plan of care with the patient's son and daughter on the phone. All were agreeable. We will plan for return visit in   I provided 30 minutes of face-to-face time during this this encounter and > 50% was spent counseling as documented under my assessment and plan.   Eleanor Bach, FNP- [facility] A DEPT OF [facility] [address] [place] [address] Dept: [phone] Dept Fax: [phone]   Orders Placed This Encounter  Procedures   Sample to Blood Bank    Standing Status:   Future    Number of Occurrences:   1    Expiration Date:   12/15/2024

## 2023-12-19 ENCOUNTER — Inpatient Hospital Stay

## 2023-12-19 DIAGNOSIS — D473 Essential (hemorrhagic) thrombocythemia: Secondary | ICD-10-CM | POA: Diagnosis not present

## 2023-12-19 DIAGNOSIS — D474 Osteomyelofibrosis: Secondary | ICD-10-CM

## 2023-12-19 MED ORDER — DIPHENHYDRAMINE HCL 25 MG PO CAPS
25.0000 mg | ORAL_CAPSULE | Freq: Once | ORAL | Status: AC
  Administered 2023-12-19: 25 mg via ORAL
  Filled 2023-12-19: qty 1

## 2023-12-19 MED ORDER — ACETAMINOPHEN 325 MG PO TABS
650.0000 mg | ORAL_TABLET | Freq: Once | ORAL | Status: AC
  Administered 2023-12-19: 650 mg via ORAL
  Filled 2023-12-19: qty 2

## 2023-12-19 MED ORDER — SODIUM CHLORIDE 0.9% IV SOLUTION
250.0000 mL | INTRAVENOUS | Status: DC
  Administered 2023-12-19: 250 mL via INTRAVENOUS

## 2023-12-19 NOTE — Patient Instructions (Signed)

## 2023-12-20 LAB — BPAM RBC
Blood Product Expiration Date: 202510102359
ISSUE DATE / TIME: 202509150656
Unit Type and Rh: 5100

## 2023-12-20 LAB — TYPE AND SCREEN
ABO/RH(D): O POS
Antibody Screen: NEGATIVE
Unit division: 0

## 2023-12-27 NOTE — Progress Notes (Unsigned)
 [name] [address] [place] [address] Phone: [phone] Subjective:   Erin [name], am serving as a scribe for Dr. Arthea [name].  I'm seeing this patient by the request  of:  Erin Toribio MATSU, MD  CC: Bilateral knee pain  YEP:Dlagzrupcz  11/02/2023 Chronic problem, palliative at this moment with patient's severe arthritis.  With comorbidities make it difficult to treat in any other way at the moment.  Discussed icing regimen at home exercises, increase activity slowly.  Follow-up again in 8 to 10 weeks otherwise.     Updated 12/28/2023 Erin [name] is a 78 y.o. female coming in with complaint of B knee pain  Patient's labs are showing the patient continues to have dropping of the hemoglobin had a blood transfusion.    Past Medical History:  Diagnosis Date   Asthma    CHF (congestive heart failure) (HCC)    hospital 11/2013-- [place]   Chronic kidney disease    Depression    Diabetes mellitus (HCC)    GERD (gastroesophageal reflux disease)    Hyperlipidemia    Hypertension    Stroke [facility])    Past Surgical History:  Procedure Laterality Date   CHOLECYSTECTOMY  2002   LOOP RECORDER INSERTION N/A 08/01/2017   Procedure: LOOP RECORDER INSERTION;  Surgeon: Inocencio Soyla Lunger, MD;  Location: [facility] INVASIVE CV LAB;  Service: Cardiovascular;  Laterality: N/A;   SPINE SURGERY  aug 2015   [facility]   TEE WITHOUT CARDIOVERSION N/A 08/01/2017   Procedure: TRANSESOPHAGEAL ECHOCARDIOGRAM (TEE);  Surgeon: Francyne Headland, MD;  Location: [facility] ENDOSCOPY;  Service: Cardiovascular;  Laterality: N/A;   TONSILLECTOMY     TUBAL LIGATION     Social History   Socioeconomic History   Marital status: Widowed    Spouse name: Not on file   Number of children: 4   Years of education: 33   Highest education level: 12th grade  Occupational History   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, materials: [employer]  Tobacco Use   Smoking  status: Former    Current packs/day: 0.00    Average packs/day: 1.5 packs/day for 20.0 years (30.0 ttl pk-yrs)    Types: Cigarettes    Start date: 08/02/1964    Quit date: 08/02/1984    Years since quitting: 39.4   Smokeless tobacco: Never  Vaping Use   Vaping status: Never Used  Substance and Sexual Activity   Alcohol use: No   Drug use: No   Sexual activity: Not Currently    Partners: Male  Other Topics Concern   Not on file  Social History Narrative   NO REG EXERCISE   Caffeine use: daily   Social Drivers of Health   Financial Resource Strain: Low Risk  (09/05/2020)   Overall Financial Resource Strain (CARDIA)    Difficulty of Paying Living Expenses: Not hard at all  Food Insecurity: No Food Insecurity (09/05/2020)   Hunger Vital Sign    Worried About Running Out of Food in the Last Year: Never true    Ran Out of Food in the Last Year: Never true  Transportation Needs: No Transportation Needs (09/05/2020)   PRAPARE - Administrator, Civil Service (Medical): No    Lack of Transportation (Non-Medical): No  Physical Activity: Inactive (09/05/2020)   Exercise Vital Sign    Days of Exercise per Week: 0 days    Minutes of Exercise per Session: 0 min  Stress: No Stress Concern Present (09/05/2020)   [employer] of Occupational Health - Occupational Stress Questionnaire    Feeling of Stress : Only a little  Social Connections: Socially Isolated (09/05/2020)   Social Connection and Isolation Panel    Frequency of Communication with Friends and Family: More than three times a week    Frequency of Social Gatherings with Friends and Family: More than three times a week    Attends Religious Services: Never    Database administrator or Organizations: No    Attends Banker Meetings: Never    Marital Status: Widowed   Allergies  Allergen Reactions   Bee Venom     Unknown   Family History  Problem Relation Age of Onset   Hypertension Mother    Alzheimer's  disease Mother    Hyperlipidemia Mother    Heart disease Father        cad   Asthma Father    Cancer Father 66       leukemia   Stroke Father    Hypertension Father    Heart disease Sister 51       MI   Pulmonary fibrosis Sister    Arthritis Brother    Heart disease Maternal Uncle    Stroke Maternal Uncle    Uterine cancer Paternal Grandmother    Stomach cancer Paternal Grandfather    Heart disease Maternal Uncle    Heart disease Maternal Uncle    Cancer Maternal Aunt        leukemia     Current Outpatient Medications (Cardiovascular):    atorvastatin  (LIPITOR) 40 MG tablet, TAKE 1 TABLET (40 MG TOTAL) BY MOUTH DAILY. PT NEEDS OV FOR FURTHER REFILLS (Patient taking differently: Take 1 tablet by mouth every evening.)   hydrALAZINE  (APRESOLINE ) 10 MG tablet, Take 10 mg by mouth 2 (two) times daily.   torsemide  40 MG TABS, Take 40 mg by mouth every Monday, Wednesday, and Friday.  Current Outpatient Medications (Respiratory):    albuterol  (VENTOLIN  HFA) 108 (90 Base) MCG/ACT inhaler, Inhale into the lungs.   ipratropium (ATROVENT ) 0.06 % nasal spray, Place 2 sprays into both nostrils in the morning and at bedtime.  Current Outpatient Medications (Analgesics):    allopurinol  (ZYLOPRIM ) 100 MG tablet, Take 150 mg by mouth daily. For gout   HYDROmorphone (DILAUDID) 4 MG tablet, Take 4 mg by mouth every 8 (eight) hours as needed for severe pain.   Current Outpatient Medications (Other):    ALPRAZolam  (XANAX ) 0.25 MG tablet, Take 0.25 mg by mouth at bedtime.   busPIRone  (BUSPAR ) 15 MG tablet, Take 1 tablet (15 mg total) by mouth 3 (three) times daily. Pt needs OV for further refills   Cholecalciferol 25 MCG (1000 UT) tablet, Take 1 tablet by mouth daily.   diclofenac  Sodium (VOLTAREN ) 1 % GEL, Apply 4 g topically 4 (four) times daily as needed (knee pain).   docusate sodium  (COLACE) 100 MG capsule, Take 100 mg by mouth 2 (two) times daily.   gabapentin  (NEURONTIN ) 100 MG capsule,  Take by mouth.   LORazepam (ATIVAN) 0.5 MG tablet, Take 0.5 mg by mouth every 6 (six) hours as needed for anxiety.   metoCLOPramide (REGLAN) 5 MG tablet, Take 5 mg by mouth daily.   Multiple Vitamin (MULTI-VITAMIN) tablet, Take 1 tablet by mouth daily.   pantoprazole  (PROTONIX ) 40 MG tablet, TAKE 1 TABLET BY MOUTH EVERY DAY   potassium chloride  (KLOR-CON  M) 10 MEQ tablet, Take 1 tablet (10  mEq total) by mouth daily.   Sennosides-Docusate Sodium  8.6-50 MG CAPS, Take 1 tablet by mouth daily.   sertraline  (ZOLOFT ) 50 MG tablet, Take 1 tablet (50 mg total) by mouth daily.   traZODone  (DESYREL ) 50 MG tablet, Take 50 mg by mouth at bedtime.   Reviewed prior external information including notes and imaging from  primary care provider As well as notes that were available from care everywhere and other healthcare systems.  Past medical history, social, surgical and family history all reviewed in electronic medical record.  No pertanent information unless stated regarding to the chief complaint.   Review of Systems:  No headache, visual changes, nausea, vomiting, diarrhea, constipation, dizziness, abdominal pain, skin rash, fevers, chills, night sweats, weight loss, swollen lymph nodes,  joint swelling, chest pain, shortness of breath, mood changes. POSITIVE muscle aches, body aches  Objective  Blood pressure 110/62, pulse 69, height 5' 2 (1.575 m), SpO2 98%.   General: No apparent distress alert but not oriented, patient has lost weight since we have seen her last. HEENT: Pupils equal, extraocular movements intact  Respiratory: Patient's speak in full sentences and does not appear short of breath   Bilateral knee exam shows severe arthritis noted.  Small effusion noted bilaterally.  Sitting in a wheelchair is unable to do full range of motion   After informed written and verbal consent, patient was seated on exam table. Right knee was prepped with alcohol swab and utilizing anterolateral  approach, patient's right knee space was injected with 4:1  marcaine 0.5%: Kenalog  40mg /dL. Patient tolerated the procedure well without immediate complications.  After informed written and verbal consent, patient was seated on exam table. Left knee was prepped with alcohol swab and utilizing anterolateral approach, patient's left knee space was injected with 4:1  marcaine 0.5%: Kenalog  40mg /dL. Patient tolerated the procedure well without immediate complications.   Impression and Recommendations:    The above documentation has been reviewed and is accurate and complete Glennice Marcos M Abdulah Iqbal, DO

## 2023-12-28 ENCOUNTER — Encounter: Payer: Self-pay | Admitting: Family Medicine

## 2023-12-28 ENCOUNTER — Ambulatory Visit: Admitting: Family Medicine

## 2023-12-28 VITALS — BP 110/62 | HR 69 | Ht 62.0 in

## 2023-12-28 DIAGNOSIS — M170 Bilateral primary osteoarthritis of knee: Secondary | ICD-10-CM | POA: Diagnosis not present

## 2023-12-28 NOTE — Assessment & Plan Note (Signed)
 Chronic problem with exacerbation.  Continues to have chronic pain.  Discussed icing regimen, patient was accompanied with daughter and not oriented.  Can keep repeating injections if it is helpful or patient can see me as needed

## 2023-12-28 NOTE — Patient Instructions (Addendum)
Injected both knees See me again in 8-10 weeks

## 2024-02-24 NOTE — Progress Notes (Unsigned)
 [name] [address] [place] [address] Phone: [phone] Subjective:   ISusannah [name], am serving as a scribe for Dr. Arthea Claudene.  I'm seeing this patient by the request  of:  [name] MATSU, MD  CC: Bilateral knee pain  YEP:Dlagzrupcz  12/28/2023 Chronic problem with exacerbation.  Continues to have chronic pain.  Discussed icing regimen, patient was accompanied with daughter and not oriented.  Can keep repeating injections if it is helpful or patient can see me as needed     Updated 02/29/2024 Erin [name] is a 78 y.o. female coming in with complaint of B knee pain known arthritic changes.  Does respond well to injections intermittently that helps her with transitions.  Patient is accompanied with family member again.       Past Medical History:  Diagnosis Date   Asthma    CHF (congestive heart failure) (HCC)    hospital 11/2013-- [place]   Chronic kidney disease    Depression    Diabetes mellitus (HCC)    GERD (gastroesophageal reflux disease)    Hyperlipidemia    Hypertension    Stroke [facility])    Past Surgical History:  Procedure Laterality Date   CHOLECYSTECTOMY  2002   LOOP RECORDER INSERTION N/A 08/01/2017   Procedure: LOOP RECORDER INSERTION;  Surgeon: Inocencio Soyla Lunger, MD;  Location: [facility] INVASIVE CV LAB;  Service: Cardiovascular;  Laterality: N/A;   SPINE SURGERY  aug 2015   [facility]   TEE WITHOUT CARDIOVERSION N/A 08/01/2017   Procedure: TRANSESOPHAGEAL ECHOCARDIOGRAM (TEE);  Surgeon: Francyne Headland, MD;  Location: [facility] ENDOSCOPY;  Service: Cardiovascular;  Laterality: N/A;   TONSILLECTOMY     TUBAL LIGATION     Social History   Socioeconomic History   Marital status: Widowed    Spouse name: Not on file   Number of children: 4   Years of education: 27   Highest education level: 12th grade  Occupational History   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, Materials: [employer]  Tobacco Use    Smoking status: Former    Current packs/day: 0.00    Average packs/day: 1.5 packs/day for 20.0 years (30.0 ttl pk-yrs)    Types: Cigarettes    Start date: 08/02/1964    Quit date: 08/02/1984    Years since quitting: 39.6   Smokeless tobacco: Never  Vaping Use   Vaping status: Never Used  Substance and Sexual Activity   Alcohol use: No   Drug use: No   Sexual activity: Not Currently    Partners: Male  Other Topics Concern   Not on file  Social History Narrative   NO REG EXERCISE   Caffeine use: daily   Social Drivers of Health   Financial Resource Strain: Low Risk  (09/05/2020)   Overall Financial Resource Strain (CARDIA)    Difficulty of Paying Living Expenses: Not hard at all  Food Insecurity: No Food Insecurity (09/05/2020)   Hunger Vital Sign    Worried About Running Out of Food in the Last Year: Never true    Ran Out of Food in the Last Year: Never true  Transportation Needs: No Transportation Needs (09/05/2020)   PRAPARE - Administrator, Civil Service (Medical): No    Lack of Transportation (Non-Medical): No  Physical Activity: Inactive (09/05/2020)   Exercise Vital Sign    Days of Exercise per Week: 0 days    Minutes of Exercise per Session: 0  min  Stress: No Stress Concern Present (09/05/2020)   [employer] of Occupational Health - Occupational Stress Questionnaire    Feeling of Stress : Only a little  Social Connections: Socially Isolated (09/05/2020)   Social Connection and Isolation Panel    Frequency of Communication with Friends and Family: More than three times a week    Frequency of Social Gatherings with Friends and Family: More than three times a week    Attends Religious Services: Never    Database Administrator or Organizations: No    Attends Banker Meetings: Never    Marital Status: Widowed   Allergies  Allergen Reactions   Bee Venom     Unknown   Family History  Problem Relation Age of Onset   Hypertension Mother     Alzheimer's disease Mother    Hyperlipidemia Mother    Heart disease Father        cad   Asthma Father    Cancer Father 66       leukemia   Stroke Father    Hypertension Father    Heart disease Sister 38       MI   Pulmonary fibrosis Sister    Arthritis Brother    Heart disease Maternal Uncle    Stroke Maternal Uncle    Uterine cancer Paternal Grandmother    Stomach cancer Paternal Grandfather    Heart disease Maternal Uncle    Heart disease Maternal Uncle    Cancer Maternal Aunt        leukemia     Current Outpatient Medications (Cardiovascular):    atorvastatin  (LIPITOR) 40 MG tablet, TAKE 1 TABLET (40 MG TOTAL) BY MOUTH DAILY. PT NEEDS OV FOR FURTHER REFILLS (Patient taking differently: Take 1 tablet by mouth every evening.)   hydrALAZINE  (APRESOLINE ) 10 MG tablet, Take 10 mg by mouth 2 (two) times daily.   torsemide  40 MG TABS, Take 40 mg by mouth every Monday, Wednesday, and Friday.  Current Outpatient Medications (Respiratory):    albuterol  (VENTOLIN  HFA) 108 (90 Base) MCG/ACT inhaler, Inhale into the lungs.   ipratropium (ATROVENT ) 0.06 % nasal spray, Place 2 sprays into both nostrils in the morning and at bedtime.  Current Outpatient Medications (Analgesics):    allopurinol  (ZYLOPRIM ) 100 MG tablet, Take 150 mg by mouth daily. For gout   HYDROmorphone (DILAUDID) 4 MG tablet, Take 4 mg by mouth every 8 (eight) hours as needed for severe pain.   Current Outpatient Medications (Other):    ALPRAZolam  (XANAX ) 0.25 MG tablet, Take 0.25 mg by mouth at bedtime.   busPIRone  (BUSPAR ) 15 MG tablet, Take 1 tablet (15 mg total) by mouth 3 (three) times daily. Pt needs OV for further refills   Cholecalciferol 25 MCG (1000 UT) tablet, Take 1 tablet by mouth daily.   diclofenac  Sodium (VOLTAREN ) 1 % GEL, Apply 4 g topically 4 (four) times daily as needed (knee pain).   docusate sodium  (COLACE) 100 MG capsule, Take 100 mg by mouth 2 (two) times daily.   gabapentin  (NEURONTIN ) 100  MG capsule, Take by mouth.   LORazepam (ATIVAN) 0.5 MG tablet, Take 0.5 mg by mouth every 6 (six) hours as needed for anxiety.   metoCLOPramide (REGLAN) 5 MG tablet, Take 5 mg by mouth daily.   Multiple Vitamin (MULTI-VITAMIN) tablet, Take 1 tablet by mouth daily.   pantoprazole  (PROTONIX ) 40 MG tablet, TAKE 1 TABLET BY MOUTH EVERY DAY   potassium chloride  (KLOR-CON  M) 10 MEQ tablet, Take 1  tablet (10 mEq total) by mouth daily.   Sennosides-Docusate Sodium  8.6-50 MG CAPS, Take 1 tablet by mouth daily.   sertraline  (ZOLOFT ) 50 MG tablet, Take 1 tablet (50 mg total) by mouth daily.   traZODone  (DESYREL ) 50 MG tablet, Take 50 mg by mouth at bedtime.   Reviewed prior external information including notes and imaging from  primary care provider As well as notes that were available from care everywhere and other healthcare systems.  Past medical history, social, surgical and family history all reviewed in electronic medical record.  No pertanent information unless stated regarding to the chief complaint.   Review of Systems:  No headache, visual changes, nausea, vomiting, diarrhea, constipation, dizziness, abdominal pain, skin rash, fevers, chills, night sweats, weight loss, swollen lymph nodes, body aches, joint swelling, chest pain, shortness of breath, mood changes. POSITIVE muscle aches  Objective  Blood pressure 124/74, pulse 81, height 5' 2 (1.575 m), SpO2 97%.   General: No apparent distress alert a but not oriented mood at patient's baseline HEENT: Pupils equal, extraocular movements intact  Respiratory: Patient's speak in full sentences and does not appear short of breath  Cardiovascular: 2+ pitting edema Knee exam significant arthritic changes noted.  Patient has difficulty in following directions.  Does have some hypertonicity noted.  Diffusely tender and out of proportion to the amount of palpation.  After informed written and verbal consent, patient was seated on exam table. Right  knee was prepped with alcohol swab and utilizing anterolateral approach, patient's right knee space was injected with 4:1  marcaine 0.5%: Kenalog  40mg /dL. Patient tolerated the procedure well without immediate complications.  After informed written and verbal consent, patient was seated on exam table. Left knee was prepped with alcohol swab and utilizing anterolateral approach, patient's left knee space was injected with 4:1  marcaine 0.5%: Kenalog  40mg /dL. Patient tolerated the procedure well without immediate complications.    Impression and Recommendations:     The above documentation has been reviewed and is accurate and complete Myrah Strawderman M Aixa Corsello, DO

## 2024-02-29 ENCOUNTER — Ambulatory Visit: Admitting: Family Medicine

## 2024-02-29 ENCOUNTER — Encounter: Payer: Self-pay | Admitting: Family Medicine

## 2024-02-29 VITALS — BP 124/74 | HR 81 | Ht 62.0 in

## 2024-02-29 DIAGNOSIS — M170 Bilateral primary osteoarthritis of knee: Secondary | ICD-10-CM

## 2024-02-29 NOTE — Assessment & Plan Note (Signed)
 Chronic, with worsening symptoms.  Patient is having significant memory issues.  Patient is accompanied with daughter and we did discuss how the injections do seem to help her with transitioning from sitting to standing and getting into her bed.  Had been complaining more of the pain recently.  Discussed which activities to do and which ones to avoid.  Increase activity slowly.  Follow-up with me again in 6 to 8 weeks otherwise.

## 2024-02-29 NOTE — Patient Instructions (Signed)
 Injection in knees today Good to see you! Happy Holidays See you again in 3 months

## 2024-03-16 ENCOUNTER — Other Ambulatory Visit

## 2024-03-16 ENCOUNTER — Ambulatory Visit: Admitting: Oncology

## 2024-03-21 ENCOUNTER — Other Ambulatory Visit: Payer: Self-pay | Admitting: Oncology

## 2024-03-21 ENCOUNTER — Other Ambulatory Visit: Payer: Self-pay

## 2024-03-21 ENCOUNTER — Inpatient Hospital Stay: Attending: Hematology and Oncology

## 2024-03-21 ENCOUNTER — Telehealth: Payer: Self-pay | Admitting: Oncology

## 2024-03-21 ENCOUNTER — Inpatient Hospital Stay (HOSPITAL_BASED_OUTPATIENT_CLINIC_OR_DEPARTMENT_OTHER): Admitting: Oncology

## 2024-03-21 VITALS — BP 154/69 | HR 78 | Temp 97.6°F | Resp 18

## 2024-03-21 DIAGNOSIS — M549 Dorsalgia, unspecified: Secondary | ICD-10-CM | POA: Diagnosis not present

## 2024-03-21 DIAGNOSIS — I878 Other specified disorders of veins: Secondary | ICD-10-CM | POA: Insufficient documentation

## 2024-03-21 DIAGNOSIS — Z8673 Personal history of transient ischemic attack (TIA), and cerebral infarction without residual deficits: Secondary | ICD-10-CM | POA: Diagnosis not present

## 2024-03-21 DIAGNOSIS — M2550 Pain in unspecified joint: Secondary | ICD-10-CM | POA: Diagnosis not present

## 2024-03-21 DIAGNOSIS — Z79899 Other long term (current) drug therapy: Secondary | ICD-10-CM | POA: Insufficient documentation

## 2024-03-21 DIAGNOSIS — Z91030 Bee allergy status: Secondary | ICD-10-CM | POA: Diagnosis not present

## 2024-03-21 DIAGNOSIS — R7989 Other specified abnormal findings of blood chemistry: Secondary | ICD-10-CM | POA: Diagnosis not present

## 2024-03-21 DIAGNOSIS — D61818 Other pancytopenia: Secondary | ICD-10-CM | POA: Diagnosis not present

## 2024-03-21 DIAGNOSIS — F0150 Vascular dementia without behavioral disturbance: Secondary | ICD-10-CM | POA: Diagnosis not present

## 2024-03-21 DIAGNOSIS — D473 Essential (hemorrhagic) thrombocythemia: Secondary | ICD-10-CM | POA: Diagnosis not present

## 2024-03-21 DIAGNOSIS — N189 Chronic kidney disease, unspecified: Secondary | ICD-10-CM | POA: Insufficient documentation

## 2024-03-21 DIAGNOSIS — R410 Disorientation, unspecified: Secondary | ICD-10-CM | POA: Insufficient documentation

## 2024-03-21 DIAGNOSIS — Z806 Family history of leukemia: Secondary | ICD-10-CM | POA: Insufficient documentation

## 2024-03-21 DIAGNOSIS — D7581 Myelofibrosis: Secondary | ICD-10-CM | POA: Insufficient documentation

## 2024-03-21 DIAGNOSIS — D474 Osteomyelofibrosis: Secondary | ICD-10-CM | POA: Diagnosis not present

## 2024-03-21 DIAGNOSIS — R5383 Other fatigue: Secondary | ICD-10-CM | POA: Insufficient documentation

## 2024-03-21 LAB — CBC WITH DIFFERENTIAL (CANCER CENTER ONLY)
Abs Immature Granulocytes: 0.06 K/uL (ref 0.00–0.07)
Basophils Absolute: 0 K/uL (ref 0.0–0.1)
Basophils Relative: 0 %
Eosinophils Absolute: 0 K/uL (ref 0.0–0.5)
Eosinophils Relative: 1 %
HCT: 34.1 % — ABNORMAL LOW (ref 36.0–46.0)
Hemoglobin: 10.7 g/dL — ABNORMAL LOW (ref 12.0–15.0)
Immature Granulocytes: 2 %
Lymphocytes Relative: 12 %
Lymphs Abs: 0.5 K/uL — ABNORMAL LOW (ref 0.7–4.0)
MCH: 28.4 pg (ref 26.0–34.0)
MCHC: 31.4 g/dL (ref 30.0–36.0)
MCV: 90.5 fL (ref 80.0–100.0)
Monocytes Absolute: 0.3 K/uL (ref 0.1–1.0)
Monocytes Relative: 7 %
Neutro Abs: 3.2 K/uL (ref 1.7–7.7)
Neutrophils Relative %: 78 %
Platelet Count: 108 K/uL — ABNORMAL LOW (ref 150–400)
RBC: 3.77 MIL/uL — ABNORMAL LOW (ref 3.87–5.11)
RDW: 18.5 % — ABNORMAL HIGH (ref 11.5–15.5)
WBC Count: 4.1 K/uL (ref 4.0–10.5)
nRBC: 0.5 % — ABNORMAL HIGH (ref 0.0–0.2)

## 2024-03-21 LAB — CMP (CANCER CENTER ONLY)
ALT: 13 U/L (ref 0–44)
AST: 25 U/L (ref 15–41)
Albumin: 4 g/dL (ref 3.5–5.0)
Alkaline Phosphatase: 104 U/L (ref 38–126)
Anion gap: 9 (ref 5–15)
BUN: 57 mg/dL — ABNORMAL HIGH (ref 8–23)
CO2: 26 mmol/L (ref 22–32)
Calcium: 9.3 mg/dL (ref 8.9–10.3)
Chloride: 103 mmol/L (ref 98–111)
Creatinine: 1.14 mg/dL — ABNORMAL HIGH (ref 0.44–1.00)
GFR, Estimated: 49 mL/min — ABNORMAL LOW (ref >60)
Glucose, Bld: 127 mg/dL — ABNORMAL HIGH (ref 70–99)
Potassium: 4.5 mmol/L (ref 3.5–5.1)
Sodium: 138 mmol/L (ref 135–145)
Total Bilirubin: 0.3 mg/dL (ref 0.0–1.2)
Total Protein: 6.8 g/dL (ref 6.5–8.1)

## 2024-03-21 NOTE — Progress Notes (Signed)
 " Erin [name]  [address] [place],  [place]  [address] [phone]  Clinic Day: 03/21/24  Referring physician: [name] MATSU, MD   CHIEF COMPLAINT:  CC:  Myelofibrosis  Current Treatment:  Surveillance  HISTORY OF PRESENT ILLNESS:  Erin [name] is a 78 y.o. female who I saw many years ago for thrombocytosis and she was referred back to me in July of 2018 for thrombocytopenia.  She has also been found to have anemia and both her hemoglobin and platelet count have been decreasing.  Her hemoglobin was down to 9.8 in July 2018 with a platelet count of 74,000. Her white count was elevated at 13,000 with 81% neutrophils, 8% lymphocytes, 9% monocytes, 1% eosinophils, and 1% basophils.  However, when I rechecked these counts on July 26th, I found more of a left shift and her white count was up to 16.6.  Her hemoglobin was a little better at 11.2 with an MCV of 84 and platelet count of 83,000. This time her differential revealed 61% neutrophils, 16% bands, 6% lymphocytes, 3% monocytes, 1% basophils, 5% metamyelocytes, 6% myelocytes and 2% blasts.  A reticulocyte count was 5.4%, sed rate was 93 and LDH was markedly elevated at 1939.  No monoclonal spike was seen.  I reviewed her peripheral blood smear myself and did confirm a rare blast and promyelocyte.  She was understandably anxious since her father had some form of leukemia at age 74 and lived 5 years.  She thinks this may have been chronic myelogenous leukemia.  An aunt also had leukemia.  She does note dyspnea with exertion.  She does see a nephrologist in [facility] with [facility] for chronic kidney disease, Erin Lufadeju, MD.  She also has venous stasis and has wraps on her legs.  She complains of pain of her lower back and legs at a 6/10.  When I saw her in August 2018, her white count was still elevated at 14.5 with 45% neutrophils, 19% bands, 9% lymphocytes, 1% monocytes, 2% eosinophils, 2% basophils, 8% metamyelocytes, 14%  myelocytes and 1 nucleated red blood cell per 100 WBC's.  Her hemoglobin was stable at 11.3 but her platelet count was mildly lower at 74,000 and a smear revealed occasional teardrops.  JAK 2 mutation was negative.  We therefore performed a bone marrow on August 22nd and this was consistent with myelofibrosis.  The cytogenetics revealed normal 52 XX chromosomes and PCR for bcr/ABL was negative.  The sample was very limited as the aspirate was mainly blood.  There was a predominance of granulocytes, red cells and occasional atypical megakaryocytes associated with reticulin fibrosis, for a diagnosis of myelofibrosis.  Since that time she has had a stroke, and she has worsening vascular dementia.    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 This has caused her blood counts to be chronically low, and does not require treatment at this time. I would only transfuse her if her hemoglobin drops to 7.5 or less.   INTERVAL HISTORY:  [name] is here for routine follow up for Myelofibrosis. Patient states that she feels well and has no complaints of pain. She has a WBC of 4.1, low hemoglobin of 10.7 improved from 7.1 after an transfusion of PRBC's, and low platelet count of 108,000 down from 143,000. Her CMP is normal other than an elevated creatinine of 1.14 with a BUN of 57. I will see her back in 6 months with CBC and CMP.  She denies fever,  chills, night sweats, or other signs of infection. She denies cardiorespiratory and gastrointestinal issues. She  denies pain. Her appetite is good and she could not be weighed due to being in wheelchair. This patient is accompanied in the office by her daughter.    REVIEW OF SYSTEMS:  Review of Systems  Constitutional:  Positive for fatigue. Negative for appetite change, chills, diaphoresis, fever and unexpected weight change.  HENT:  Negative.  Negative for hearing loss, lump/mass, mouth sores, nosebleeds, sore throat, tinnitus, trouble  swallowing and voice change.   Eyes: Negative.  Negative for eye problems and icterus.  Respiratory: Negative.  Negative for chest tightness, cough, hemoptysis, shortness of breath and wheezing.   Cardiovascular:  Positive for leg swelling (bilateral, severe). Negative for chest pain and palpitations.  Gastrointestinal: Negative.  Negative for abdominal distention, abdominal pain, blood in stool, constipation, diarrhea, nausea, rectal pain and vomiting.  Endocrine: Negative.   Genitourinary: Negative.  Negative for bladder incontinence, difficulty urinating, dyspareunia, dysuria, frequency, hematuria, menstrual problem, nocturia, pelvic pain, vaginal bleeding and vaginal discharge.   Musculoskeletal:  Positive for arthralgias (bilateral knees), back pain and gait problem (in a wheelchair). Negative for flank pain, myalgias, neck pain and neck stiffness.  Skin: Negative.  Negative for itching, rash and wound.  Neurological:  Positive for gait problem (in a wheelchair). Negative for dizziness, extremity weakness, headaches, light-headedness, numbness, seizures and speech difficulty.  Hematological:  Negative for adenopathy. Bruises/bleeds easily.  Psychiatric/Behavioral:  Positive for confusion. Negative for decreased concentration, depression, sleep disturbance and suicidal ideas. The patient is not nervous/anxious.     VITALS:  Blood pressure (!) 154/69, pulse 78, temperature 97.6 F (36.4 C), temperature source Oral, resp. rate 18, SpO2 99%.  Wt Readings from Last 3 Encounters:  02/12/22 225 lb 1.4 oz (102.1 kg)  10/07/20 220 lb (99.8 kg)  08/26/20 215 lb 3.2 oz (97.6 kg)    There is no height or weight on file to calculate BMI.  Performance status (ECOG): 2 - Symptomatic, <50% confined to bed  PHYSICAL EXAM:  Physical Exam Vitals and nursing note reviewed. Exam conducted with a chaperone present.  Constitutional:      General: She is not in acute distress.    Appearance: Normal  appearance. She is normal weight. She is not ill-appearing, toxic-appearing or diaphoretic.  HENT:     Head: Normocephalic and atraumatic.     Right Ear: Tympanic membrane, ear canal and external ear normal. There is no impacted cerumen.     Left Ear: Tympanic membrane, ear canal and external ear normal. There is no impacted cerumen.     Nose: Nose normal. No congestion or rhinorrhea.     Mouth/Throat:     Mouth: Mucous membranes are moist.     Pharynx: Oropharynx is clear. No oropharyngeal exudate or posterior oropharyngeal erythema.  Eyes:     General: No scleral icterus.       Right eye: No discharge.        Left eye: No discharge.     Extraocular Movements: Extraocular movements intact.     Conjunctiva/sclera: Conjunctivae normal.     Pupils: Pupils are equal, round, and reactive to light.  Neck:     Vascular: No carotid bruit.  Cardiovascular:     Rate and Rhythm: Normal rate and regular rhythm.     Pulses: Normal pulses.     Heart sounds: Normal heart sounds. No murmur heard.    No friction rub. No gallop.  Pulmonary:  Effort: Pulmonary effort is normal. No respiratory distress.     Breath sounds: Normal breath sounds. No stridor. No wheezing, rhonchi or rales.  Chest:     Chest wall: No tenderness.  Abdominal:     General: Bowel sounds are normal. There is no distension.     Palpations: Abdomen is soft. There is no hepatomegaly, splenomegaly or mass.     Tenderness: There is no abdominal tenderness. There is no right CVA tenderness, left CVA tenderness, guarding or rebound.     Hernia: No hernia is present.  Musculoskeletal:        General: Swelling present. No tenderness, deformity or signs of injury. Normal range of motion.     Cervical back: Normal range of motion and neck supple. No rigidity or tenderness.     Right lower leg: 2+ Edema present.     Left lower leg: 2+ Edema present.     Comments: L>R with brawny induration chronic venous stasis changes   Lymphadenopathy:     Cervical: No cervical adenopathy.  Skin:    General: Skin is warm and dry.     Coloration: Skin is pale. Skin is not jaundiced.     Findings: No bruising, erythema, lesion or rash.  Neurological:     General: No focal deficit present.     Mental Status: She is alert and oriented to person, place, and time. Mental status is at baseline.     Cranial Nerves: No cranial nerve deficit.     Sensory: No sensory deficit.     Motor: No weakness.     Coordination: Coordination normal.     Gait: Gait normal.     Deep Tendon Reflexes: Reflexes normal.  Psychiatric:        Mood and Affect: Mood is not anxious or depressed.        Speech: Speech is not rapid and pressured.        Thought Content: Thought content is not paranoid. Thought content does not include suicidal ideation.        Cognition and Memory: Cognition is impaired. She exhibits impaired recent memory.        Judgment: Judgment is not inappropriate.    LABS:      Latest Ref Rng & Units 03/21/2024    2:13 PM 12/16/2023    9:09 AM 06/10/2023   10:32 AM  CBC  WBC 4.0 - 10.5 K/uL 4.1  3.5  4.4   Hemoglobin 12.0 - 15.0 g/dL 89.2  7.1  9.1   Hematocrit 36.0 - 46.0 % 34.1  23.0  27.5   Platelets 150 - 400 K/uL 108  143  95       Latest Ref Rng & Units 03/21/2024    2:13 PM 12/16/2023    9:09 AM 06/10/2023   10:32 AM  CMP  Glucose 70 - 99 mg/dL 872  863  861   BUN 8 - 23 mg/dL 57  58  49   Creatinine 0.44 - 1.00 mg/dL 8.85  8.32  8.78   Sodium 135 - 145 mmol/L 138  137  141   Potassium 3.5 - 5.1 mmol/L 4.5  4.5  4.3   Chloride 98 - 111 mmol/L 103  102  104   CO2 22 - 32 mmol/L 26  21  20    Calcium  8.9 - 10.3 mg/dL 9.3  8.6  9.4   Total Protein 6.5 - 8.1 g/dL 6.8  6.6  6.6   Total Bilirubin 0.0 -  1.2 mg/dL 0.3  0.3  0.5   Alkaline Phos 38 - 126 U/L 104  107  127   AST 15 - 41 U/L 25  20  24    ALT 0 - 44 U/L 13  11  15       No results found for: TOTALPROTELP, ALBUMINELP, A1GS, A2GS, BETS,  BETA2SER, GAMS, MSPIKE, SPEI Lab Results  Component Value Date   TIBC 249 (L) 06/10/2023   TIBC 236 (L) 12/24/2021   TIBC 299 04/27/2021   FERRITIN 150 06/10/2023   FERRITIN 263 12/24/2021   FERRITIN 108 04/27/2021   IRONPCTSAT 32 (H) 06/10/2023   IRONPCTSAT 29 12/24/2021   IRONPCTSAT 20 04/27/2021   Lab Results  Component Value Date   LDH 1,016 (A) 04/03/2020     STUDIES:      HISTORY:   Allergies:  Allergies  Allergen Reactions   Bee Venom     Unknown    Current Medications: Current Outpatient Medications  Medication Sig Dispense Refill   albuterol  (VENTOLIN  HFA) 108 (90 Base) MCG/ACT inhaler Inhale into the lungs.     allopurinol  (ZYLOPRIM ) 100 MG tablet Take 150 mg by mouth daily. For gout     ALPRAZolam  (XANAX ) 0.25 MG tablet Take 0.25 mg by mouth at bedtime.     atorvastatin  (LIPITOR) 40 MG tablet TAKE 1 TABLET (40 MG TOTAL) BY MOUTH DAILY. PT NEEDS OV FOR FURTHER REFILLS (Patient taking differently: Take 1 tablet by mouth every evening.) 90 tablet 1   busPIRone  (BUSPAR ) 15 MG tablet Take 1 tablet (15 mg total) by mouth 3 (three) times daily. Pt needs OV for further refills 270 tablet 0   Cholecalciferol 25 MCG (1000 UT) tablet Take 1 tablet by mouth daily.     diclofenac  Sodium (VOLTAREN ) 1 % GEL Apply 4 g topically 4 (four) times daily as needed (knee pain).     docusate sodium  (COLACE) 100 MG capsule Take 100 mg by mouth 2 (two) times daily.     gabapentin  (NEURONTIN ) 100 MG capsule Take by mouth.     hydrALAZINE  (APRESOLINE ) 10 MG tablet Take 10 mg by mouth 2 (two) times daily.     HYDROmorphone (DILAUDID) 4 MG tablet Take 4 mg by mouth every 8 (eight) hours as needed for severe pain.     ipratropium (ATROVENT ) 0.06 % nasal spray Place 2 sprays into both nostrils in the morning and at bedtime.     LORazepam (ATIVAN) 0.5 MG tablet Take 0.5 mg by mouth every 6 (six) hours as needed for anxiety.     metoCLOPramide (REGLAN) 5 MG tablet Take 5 mg by mouth  daily.     Multiple Vitamin (MULTI-VITAMIN) tablet Take 1 tablet by mouth daily.     pantoprazole  (PROTONIX ) 40 MG tablet TAKE 1 TABLET BY MOUTH EVERY DAY 90 tablet 1   potassium chloride  (KLOR-CON  M) 10 MEQ tablet Take 1 tablet (10 mEq total) by mouth daily.     Sennosides-Docusate Sodium  8.6-50 MG CAPS Take 1 tablet by mouth daily.     sertraline  (ZOLOFT ) 50 MG tablet Take 1 tablet (50 mg total) by mouth daily.     torsemide  40 MG TABS Take 40 mg by mouth every Monday, Wednesday, and Friday.     traZODone  (DESYREL ) 50 MG tablet Take 50 mg by mouth at bedtime.     No current facility-administered medications for this visit.   ASSESSMENT & PLAN:  Assessment:   1.  Myelofibrosis, diagnosed in August 2018. This likely will  not threaten her life, and so only routine surveillance is recommended.  She has pancytopenia, but her counts will chronically be low and she is not a candidate for aggressive therapy with her dementia and requiring nursing home care. Hemoglobin today is 10.7.  She is largely asymptomatic and requires transfusions infrequently.  2.  Severe lower extremity edema.   3.  Vascular dementia. She seems to be doing fairly well today with her mental status and speech.  4.  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   Plan: She has a WBC of 4.1, low hemoglobin of 10.7 improved from 7.1 after an transfusion of PRBC's, and low platelet count of 108,000 down from 143,000. Her CMP is normal other than an elevated creatinine of 1.14 with a BUN of 57. I will see her back in 6 months with CBC and CMP.  Her daughter was present today for her visit and discussed the plan of care with the daughter in person. All were agreeable.  I provided 30 minutes of face-to-face time during this this encounter and > 50% was spent counseling as documented under my assessment and plan.   Wanda VEAR Cornish, MD  [facility] DEPT OF [facility] [address] [place] [address] Dept: [phone] Dept Fax: [phone]   Orders Placed This Encounter  Procedures   CBC with Differential (Cancer Center Only)    Standing Status:   Future    Number of Occurrences:   1    Expiration Date:   03/21/2025   CMP (Cancer Center only)    Standing Status:   Future    Number of Occurrences:   1    Expiration Date:   03/21/2025     LILLETTE Rolin HERO Lassiter,acting as a scribe for Wanda VEAR Cornish, MD.,have documented all relevant documentation on the behalf of Wanda VEAR Cornish, MD,as directed by  Wanda VEAR Cornish, MD while in the presence of Wanda VEAR Cornish, MD.  "

## 2024-03-21 NOTE — Telephone Encounter (Signed)
 Patient has been scheduled for follow-up visit per 03/21/2024 LOS.  Pt given an appt calendar with date and time.

## 2024-04-01 ENCOUNTER — Encounter: Payer: Self-pay | Admitting: Oncology

## 2024-05-08 NOTE — Progress Notes (Unsigned)
 " [name] 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  Patient is having significant memory issues.  Patient is accompanied with daughter and we did discuss how the injections do seem to help her with transitioning from sitting to standing and getting into her bed.  Had been complaining more of the pain recently.  Discussed which activities to do and which ones to avoid.  Increase activity slowly.  Follow-up with me again in 6 to 8 weeks otherwise.     Updated 05/09/2024 Erin [name] is a 79 y.o. female coming in with complaint of B knee pain      Past Medical History:  Diagnosis Date   Asthma    CHF (congestive heart failure) [facility])    hospital 11/2013-- [place]   Chronic kidney disease    Depression    Diabetes mellitus (HCC)    GERD (gastroesophageal reflux disease)    Hyperlipidemia    Hypertension    Stroke [facility])    Past Surgical History:  Procedure Laterality Date   CHOLECYSTECTOMY  2002   LOOP RECORDER INSERTION N/A 08/01/2017   Procedure: LOOP RECORDER INSERTION;  Surgeon: Inocencio Soyla Lunger, MD;  Location: [facility] INVASIVE CV LAB;  Service: Cardiovascular;  Laterality: N/A;   SPINE SURGERY  aug 2015   [facility]   TEE WITHOUT CARDIOVERSION N/A 08/01/2017   Procedure: TRANSESOPHAGEAL ECHOCARDIOGRAM (TEE);  Surgeon: Francyne Headland, MD;  Location: [facility] ENDOSCOPY;  Service: Cardiovascular;  Laterality: N/A;   TONSILLECTOMY     TUBAL LIGATION     Social History   Socioeconomic History   Marital status: Widowed    Spouse name: Not on file   Number of children: 4   Years of education: 74   Highest education level: 12th grade  Occupational History   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: [employer]  Tobacco Use   Smoking  status: Former    Current packs/day: 0.00    Average packs/day: 1.5 packs/day for 20.0 years (30.0 ttl pk-yrs)    Types: Cigarettes    Start date: 08/02/1964    Quit date: 08/02/1984    Years since quitting: 39.7   Smokeless tobacco: Never  Vaping Use   Vaping status: Never Used  Substance and Sexual Activity   Alcohol use: No   Drug use: No   Sexual activity: Not Currently    Partners: Male  Other Topics Concern   Not on file  Social History Narrative   NO REG EXERCISE   Caffeine use: daily   Social Drivers of Health   Tobacco Use: Medium Risk (04/01/2024)   Patient History    Smoking Tobacco Use: Former    Smokeless Tobacco Use: Never    Passive Exposure: Not on Actuary Strain: Not on file  Food Insecurity: Not on file  Transportation Needs: Not on file  Physical Activity: Not on file  Stress: Not on file  Social Connections: Not on file  Depression (PHQ2-9): Low Risk (03/21/2024)   Depression (PHQ2-9)    PHQ-2 Score: 0  Alcohol Screen: Not on file  Housing: Not on file  Utilities: Not on file  Health Literacy: Not on file   Allergies[1] Family History  Problem Relation Age of Onset   Hypertension  Mother    Alzheimer's disease Mother    Hyperlipidemia Mother    Heart disease Father        cad   Asthma Father    Cancer Father 62       leukemia   Stroke Father    Hypertension Father    Heart disease Sister 56       MI   Pulmonary fibrosis Sister    Arthritis Brother    Heart disease Maternal Uncle    Stroke Maternal Uncle    Uterine cancer Paternal Grandmother    Stomach cancer Paternal Grandfather    Heart disease Maternal Uncle    Heart disease Maternal Uncle    Cancer Maternal Aunt        leukemia    Current Outpatient Medications (Cardiovascular):    atorvastatin  (LIPITOR) 40 MG tablet, TAKE 1 TABLET (40 MG TOTAL) BY MOUTH DAILY. PT NEEDS OV FOR FURTHER REFILLS (Patient taking differently: Take 1 tablet by mouth every  evening.)   hydrALAZINE  (APRESOLINE ) 10 MG tablet, Take 10 mg by mouth 2 (two) times daily.   torsemide  40 MG TABS, Take 40 mg by mouth every Monday, Wednesday, and Friday.  Current Outpatient Medications (Respiratory):    albuterol  (VENTOLIN  HFA) 108 (90 Base) MCG/ACT inhaler, Inhale into the lungs.   ipratropium (ATROVENT ) 0.06 % nasal spray, Place 2 sprays into both nostrils in the morning and at bedtime.  Current Outpatient Medications (Analgesics):    allopurinol  (ZYLOPRIM ) 100 MG tablet, Take 150 mg by mouth daily. For gout   HYDROmorphone (DILAUDID) 4 MG tablet, Take 4 mg by mouth every 8 (eight) hours as needed for severe pain.  Current Outpatient Medications (Other):    ALPRAZolam  (XANAX ) 0.25 MG tablet, Take 0.25 mg by mouth at bedtime.   busPIRone  (BUSPAR ) 15 MG tablet, Take 1 tablet (15 mg total) by mouth 3 (three) times daily. Pt needs OV for further refills   Cholecalciferol 25 MCG (1000 UT) tablet, Take 1 tablet by mouth daily.   diclofenac  Sodium (VOLTAREN ) 1 % GEL, Apply 4 g topically 4 (four) times daily as needed (knee pain).   docusate sodium  (COLACE) 100 MG capsule, Take 100 mg by mouth 2 (two) times daily.   gabapentin  (NEURONTIN ) 100 MG capsule, Take by mouth.   LORazepam (ATIVAN) 0.5 MG tablet, Take 0.5 mg by mouth every 6 (six) hours as needed for anxiety.   metoCLOPramide (REGLAN) 5 MG tablet, Take 5 mg by mouth daily.   Multiple Vitamin (MULTI-VITAMIN) tablet, Take 1 tablet by mouth daily.   pantoprazole  (PROTONIX ) 40 MG tablet, TAKE 1 TABLET BY MOUTH EVERY DAY   potassium chloride  (KLOR-CON  M) 10 MEQ tablet, Take 1 tablet (10 mEq total) by mouth daily.   Sennosides-Docusate Sodium  8.6-50 MG CAPS, Take 1 tablet by mouth daily.   sertraline  (ZOLOFT ) 50 MG tablet, Take 1 tablet (50 mg total) by mouth daily.   traZODone  (DESYREL ) 50 MG tablet, Take 50 mg by mouth at bedtime.   Reviewed prior external information including notes and imaging from  primary care  provider As well as notes that were available from care everywhere and other healthcare systems.  Past medical history, social, surgical and family history all reviewed in electronic medical record.  No pertanent information unless stated regarding to the chief complaint.   Review of Systems:  No headache, visual changes, nausea, vomiting, diarrhea, constipation, dizziness, abdominal pain, skin rash, fevers, chills, night sweats, weight loss, swollen lymph nodes, chest pain, shortness of  breath, mood changes. POSITIVE muscle aches, body aches, joint swelling  Objective  There were no vitals taken for this visit.   General: No apparent distress alert but not oriented HEENT: Pupils equal, extraocular movements intact   Bilateral knee Arthritic changes noted.  Patient is sitting in a wheelchair.  Diffuse tenderness to even light palpation.    Impression and Recommendations:    The above documentation has been reviewed and is accurate and complete Arthea CHRISTELLA Sharps, DO        [1]  Allergies Allergen Reactions   Bee Venom     Unknown   "

## 2024-05-09 ENCOUNTER — Ambulatory Visit: Admitting: Family Medicine

## 2024-09-19 ENCOUNTER — Inpatient Hospital Stay

## 2024-09-19 ENCOUNTER — Inpatient Hospital Stay: Admitting: Oncology
